# Patient Record
Sex: Male | Born: 1940 | Race: Black or African American | Hispanic: No | Smoking: Former smoker
Health system: Southern US, Community
[De-identification: ages and names within clinical notes are randomized; demographics above are authoritative.]

## PROBLEM LIST (undated history)

## (undated) DIAGNOSIS — E559 Vitamin D deficiency, unspecified: Secondary | ICD-10-CM

## (undated) DIAGNOSIS — I442 Atrioventricular block, complete: Secondary | ICD-10-CM

## (undated) DIAGNOSIS — I350 Nonrheumatic aortic (valve) stenosis: Secondary | ICD-10-CM

## (undated) DIAGNOSIS — M109 Gout, unspecified: Secondary | ICD-10-CM

## (undated) DIAGNOSIS — E785 Hyperlipidemia, unspecified: Secondary | ICD-10-CM

## (undated) DIAGNOSIS — R413 Other amnesia: Secondary | ICD-10-CM

## (undated) DIAGNOSIS — E119 Type 2 diabetes mellitus without complications: Secondary | ICD-10-CM

## (undated) DIAGNOSIS — D519 Vitamin B12 deficiency anemia, unspecified: Secondary | ICD-10-CM

## (undated) DIAGNOSIS — F419 Anxiety disorder, unspecified: Secondary | ICD-10-CM

## (undated) DIAGNOSIS — R269 Unspecified abnormalities of gait and mobility: Secondary | ICD-10-CM

## (undated) DIAGNOSIS — N182 Chronic kidney disease, stage 2 (mild): Secondary | ICD-10-CM

## (undated) DIAGNOSIS — I10 Essential (primary) hypertension: Secondary | ICD-10-CM

## (undated) DIAGNOSIS — Z950 Presence of cardiac pacemaker: Secondary | ICD-10-CM

## (undated) DIAGNOSIS — I739 Peripheral vascular disease, unspecified: Secondary | ICD-10-CM

## (undated) DIAGNOSIS — I441 Atrioventricular block, second degree: Secondary | ICD-10-CM

## (undated) DIAGNOSIS — I1310 Hypertensive heart and chronic kidney disease without heart failure, with stage 1 through stage 4 chronic kidney disease, or unspecified chronic kidney disease: Secondary | ICD-10-CM

## (undated) HISTORY — DX: Essential (primary) hypertension: I10

## (undated) HISTORY — DX: Chronic kidney disease, stage 2 (mild): N18.2

## (undated) HISTORY — DX: Unspecified abnormalities of gait and mobility: R26.9

## (undated) HISTORY — PX: CARDIOVASCULAR STRESS TEST: SHX262

## (undated) HISTORY — DX: Other amnesia: R41.3

## (undated) HISTORY — DX: Vitamin B12 deficiency anemia, unspecified: D51.9

## (undated) HISTORY — DX: Vitamin D deficiency, unspecified: E55.9

## (undated) HISTORY — DX: Hypertensive heart and chronic kidney disease without heart failure, with stage 1 through stage 4 chronic kidney disease, or unspecified chronic kidney disease: I13.10

## (undated) HISTORY — DX: Hyperlipidemia, unspecified: E78.5

## (undated) HISTORY — DX: Peripheral vascular disease, unspecified: I73.9

## (undated) HISTORY — DX: Type 2 diabetes mellitus without complications: E11.9

## (undated) HISTORY — DX: Gout, unspecified: M10.9

## (undated) HISTORY — DX: Atrioventricular block, second degree: I44.1

## (undated) HISTORY — PX: TRANSTHORACIC ECHOCARDIOGRAM: SHX275

## (undated) HISTORY — PX: OTHER SURGICAL HISTORY: SHX169

## (undated) HISTORY — DX: Anxiety disorder, unspecified: F41.9

## (undated) HISTORY — PX: EYE SURGERY: SHX253

---

## 2014-03-17 ENCOUNTER — Ambulatory Visit: Payer: Self-pay | Admitting: Podiatry

## 2014-04-09 ENCOUNTER — Encounter: Payer: Self-pay | Admitting: Podiatry

## 2014-04-09 ENCOUNTER — Other Ambulatory Visit (HOSPITAL_COMMUNITY): Payer: Self-pay | Admitting: *Deleted

## 2014-04-09 ENCOUNTER — Ambulatory Visit (INDEPENDENT_AMBULATORY_CARE_PROVIDER_SITE_OTHER): Payer: Medicare Other | Admitting: Podiatry

## 2014-04-09 ENCOUNTER — Telehealth (HOSPITAL_COMMUNITY): Payer: Self-pay | Admitting: *Deleted

## 2014-04-09 VITALS — BP 188/80 | HR 63 | Resp 16 | Ht 71.0 in | Wt 214.0 lb

## 2014-04-09 DIAGNOSIS — M2010 Hallux valgus (acquired), unspecified foot: Secondary | ICD-10-CM

## 2014-04-09 DIAGNOSIS — L600 Ingrowing nail: Secondary | ICD-10-CM

## 2014-04-09 NOTE — Progress Notes (Signed)
   Subjective:    Patient ID: Dylan Ina., male    DOB: 1941-08-25, 73 y.o.   MRN: [id number]  HPI Comments: N debridement L 1 - 10 toenails D and O long-term C elongated toenails A diabetic pt, difficulty cutting T history of podiatric care in [place]  Diabetic x25 years without any recent podiatric evaluation and/or treatment the   Review of Systems  Constitutional: Positive for activity change and appetite change.  Genitourinary: Positive for frequency.  All other systems reviewed and are negative.      Objective:   Physical Exam  Orientated x3 space black male  Vascular: DP right 0/4 and DP left 2/4 PT right 0/4 and PT left 2/4  Neurological: Sensation to 10 g monofilament wire intact 5/5 bilaterally Vibratory sensation intact bilaterally Ankle reflexes equal and reactive bilaterally  Dermatological: Incurvated toenails without any dystrophic changes noted  Musculoskeletal: HAV deformity left No restriction ankle, midtarsal, subtalar joints bilaterally      Assessment & Plan:   Assessment: Rule out peripheral arterial disease because of diminished pedal pulses right Incurvated toenails x10 HAV deformity left  Plan: Refer patient to vascular lab for lower extremity arterial Doppler for the indication of diminished pedal pulses right and diabetes  Debrided nondystrophic  toenails x10  Notify patient of results of vascular examination

## 2014-04-09 NOTE — Patient Instructions (Signed)
The vascular lab will contact you to schedule a lower extremity arterial Doppler  Diabetes and Foot Care Diabetes may cause you to have problems because of poor blood supply (circulation) to your feet and legs. This may cause the skin on your feet to become thinner, break easier, and heal more slowly. Your skin may become dry, and the skin may peel and crack. You may also have nerve damage in your legs and feet causing decreased feeling in them. You may not notice minor injuries to your feet that could lead to infections or more serious problems. Taking care of your feet is one of the most important things you can do for yourself.  HOME CARE INSTRUCTIONS  Wear shoes at all times, even in the house. Do not go barefoot. Bare feet are easily injured.  Check your feet daily for blisters, cuts, and redness. If you cannot see the bottom of your feet, use a mirror or ask someone for help.  Wash your feet with warm water (do not use hot water) and mild soap. Then pat your feet and the areas between your toes until they are completely dry. Do not soak your feet as this can dry your skin.  Apply a moisturizing lotion or petroleum jelly (that does not contain alcohol and is unscented) to the skin on your feet and to dry, brittle toenails. Do not apply lotion between your toes.  Trim your toenails straight across. Do not dig under them or around the cuticle. File the edges of your nails with an emery board or nail file.  Do not cut corns or calluses or try to remove them with medicine.  Wear clean socks or stockings every day. Make sure they are not too tight. Do not wear knee-high stockings since they may decrease blood flow to your legs.  Wear shoes that fit properly and have enough cushioning. To break in new shoes, wear them for just a few hours a day. This prevents you from injuring your feet. Always look in your shoes before you put them on to be sure there are no objects inside.  Do not cross your  legs. This may decrease the blood flow to your feet.  If you find a minor scrape, cut, or break in the skin on your feet, keep it and the skin around it clean and dry. These areas may be cleansed with mild soap and water. Do not cleanse the area with peroxide, alcohol, or iodine.  When you remove an adhesive bandage, be sure not to damage the skin around it.  If you have a wound, look at it several times a day to make sure it is healing.  Do not use heating pads or hot water bottles. They may burn your skin. If you have lost feeling in your feet or legs, you may not know it is happening until it is too late.  Make sure your health care provider performs a complete foot exam at least annually or more often if you have foot problems. Report any cuts, sores, or bruises to your health care provider immediately. SEEK MEDICAL CARE IF:   You have an injury that is not healing.  You have cuts or breaks in the skin.  You have an ingrown nail.  You notice redness on your legs or feet.  You feel burning or tingling in your legs or feet.  You have pain or cramps in your legs and feet.  Your legs or feet are numb.  Your feet  always feel cold. SEEK IMMEDIATE MEDICAL CARE IF:   There is increasing redness, swelling, or pain in or around a wound.  There is a red line that goes up your leg.  Pus is coming from a wound.  You develop a fever or as directed by your health care provider.  You notice a bad smell coming from an ulcer or wound. Document Released: 10/14/2000 Document Revised: 06/19/2013 Document Reviewed: 03/26/2013 [facility] Patient Information 2014 [employer].

## 2014-04-10 ENCOUNTER — Encounter: Payer: Self-pay | Admitting: Podiatry

## 2014-04-30 ENCOUNTER — Ambulatory Visit (HOSPITAL_COMMUNITY)
Admission: RE | Admit: 2014-04-30 | Discharge: 2014-04-30 | Disposition: A | Discharge status: Home / Self Care | Payer: Medicare Other | Source: Ambulatory Visit | Attending: Cardiovascular Disease | Admitting: Cardiovascular Disease

## 2014-04-30 DIAGNOSIS — R0989 Other specified symptoms and signs involving the circulatory and respiratory systems: Secondary | ICD-10-CM

## 2014-04-30 DIAGNOSIS — L600 Ingrowing nail: Secondary | ICD-10-CM | POA: Diagnosis present

## 2014-04-30 NOTE — Progress Notes (Signed)
Lower Extremity Arterial Duplex Completed. °Brianna L Mazza,RVT °

## 2014-05-07 ENCOUNTER — Telehealth: Payer: Self-pay | Admitting: *Deleted

## 2014-05-07 ENCOUNTER — Telehealth (HOSPITAL_COMMUNITY): Payer: Self-pay | Admitting: *Deleted

## 2014-05-07 DIAGNOSIS — I739 Peripheral vascular disease, unspecified: Secondary | ICD-10-CM

## 2014-05-07 NOTE — Telephone Encounter (Signed)
I called and informed the patient's wife that he needs to follow-up with Dr. Gwenlyn Found because his dopplers were abnormal per Dr. Amalia Hailey.  I told her Dr. Kennon Holter office will contact him to schedule the appointment.  She stated she would let him know.

## 2014-05-07 NOTE — Telephone Encounter (Signed)
Message copied by Lolita Rieger on Wed May 07, 2014  8:43 AM ------      Message from: Gean Birchwood      Created: Wed May 07, 2014  8:10 AM       Lower extremity arterial Doppler dated 05/06/2014            The results are abnormal and need immediate followup      Contact patient and Dr. Gwenlyn Found for immediate referral for abnormal arterial Doppler ------

## 2014-05-09 ENCOUNTER — Telehealth: Payer: Self-pay | Admitting: Cardiovascular Disease

## 2014-05-09 NOTE — Telephone Encounter (Signed)
Closed encounter °

## 2014-05-13 ENCOUNTER — Encounter: Payer: Self-pay | Admitting: Cardiovascular Disease

## 2014-05-13 ENCOUNTER — Ambulatory Visit (INDEPENDENT_AMBULATORY_CARE_PROVIDER_SITE_OTHER): Payer: Medicare Other | Admitting: Cardiovascular Disease

## 2014-05-13 VITALS — BP 156/72 | HR 52

## 2014-05-13 DIAGNOSIS — I10 Essential (primary) hypertension: Secondary | ICD-10-CM | POA: Insufficient documentation

## 2014-05-13 DIAGNOSIS — E1122 Type 2 diabetes mellitus with diabetic chronic kidney disease: Secondary | ICD-10-CM | POA: Insufficient documentation

## 2014-05-13 DIAGNOSIS — I441 Atrioventricular block, second degree: Secondary | ICD-10-CM

## 2014-05-13 DIAGNOSIS — E119 Type 2 diabetes mellitus without complications: Secondary | ICD-10-CM | POA: Insufficient documentation

## 2014-05-13 DIAGNOSIS — N1831 Chronic kidney disease, stage 3a: Secondary | ICD-10-CM | POA: Insufficient documentation

## 2014-05-13 DIAGNOSIS — I739 Peripheral vascular disease, unspecified: Secondary | ICD-10-CM

## 2014-05-13 NOTE — Assessment & Plan Note (Signed)
Controlled on current medications 

## 2014-05-13 NOTE — Assessment & Plan Note (Signed)
Patient was sent to me by Dr. Amalia Hailey for evaluation of PAD because of poorly palpated peripheral pulses. He apparently recently had gout. He denies claudication. Lower extremity arterial Doppler studies performed in our office 04/30/14 revealed two-vessel disease right worse than left. There is no history of critical limb ischemia. Recommend conservative therapy at this point.

## 2014-05-13 NOTE — Patient Instructions (Signed)
Your physician wants you to follow-up in: 6 months with Dr Berry. You will receive a reminder letter in the mail two months in advance. If you don't receive a letter, please call our office to schedule the follow-up appointment.  

## 2014-05-13 NOTE — Assessment & Plan Note (Signed)
Patient is asymptomatic 21 AV block with a ventricular response of 40.

## 2014-05-13 NOTE — Progress Notes (Signed)
     05/13/2014 Dylan [name].   Jul 06, 1941  [id number]  Primary Physician Randalyn Rhea, MD Primary Cardiologist: Lorretta Harp MD Renae Gloss   HPI:  Dylan [name] is a 73 year old moderately overweight married African American male father of 4 children accompanied by his wife today. He was referred by Dr. Amalia Hailey from [place] , for evaluation of peripheral arterial disease. He is retired from working in a [employer]. His cardiac risk factors include treated hypertension and diabetes. He smoked remotely. He denies chest pain, shortness of breath or claudication. Recent lower extremity arterial Doppler studies revealed triple-vessel disease. 12-lead EKG today revealed second-degree AV block with a ventricular response of 40 despite being completely asymptomatic. He is on no rate slowing drugs.   Current Outpatient Prescriptions  Medication Sig Dispense Refill  . lisinopril (PRINIVIL,ZESTRIL) 10 MG tablet Take 10 mg by mouth daily.      . metFORMIN (GLUCOPHAGE) 1000 MG tablet Take 1,000 mg by mouth 2 (two) times daily with a meal.      . pioglitazone (ACTOS) 45 MG tablet Take 45 mg by mouth daily.       No current facility-administered medications for this visit.    No Known Allergies  History   Social History  . Marital Status: Married    Spouse Name: N/A    Number of Children: N/A  . Years of Education: N/A   Occupational History  . Not on file.   Social History Main Topics  . Smoking status: Former Research scientist (life sciences)  . Smokeless tobacco: Not on file  . Alcohol Use: Not on file  . Drug Use: Not on file  . Sexual Activity: Not on file   Other Topics Concern  . Not on file   Social History Narrative  . No narrative on file     Review of Systems: General: negative for chills, fever, night sweats or weight changes.  Cardiovascular: negative for chest pain, dyspnea on exertion, edema, orthopnea, palpitations, paroxysmal nocturnal dyspnea or shortness of  breath Dermatological: negative for rash Respiratory: negative for cough or wheezing Urologic: negative for hematuria Abdominal: negative for nausea, vomiting, diarrhea, bright red blood per rectum, melena, or hematemesis Neurologic: negative for visual changes, syncope, or dizziness All other systems reviewed and are otherwise negative except as noted above.    Blood pressure 156/72, pulse 52.  General appearance: alert and no distress Neck: no adenopathy, no carotid bruit, no JVD, supple, symmetrical, trachea midline and thyroid not enlarged, symmetric, no tenderness/mass/nodules Lungs: clear to auscultation bilaterally Heart: bradycardic pulse Extremities: diminished pedal pulses  EKG second-degree AV block with a ventricular response of 40  ASSESSMENT AND PLAN:   Second degree AV block Patient is asymptomatic 21 AV block with a ventricular response of 40.  Essential hypertension Controlled on current medications  Peripheral arterial disease Patient was sent to me by Dr. Amalia Hailey for evaluation of PAD because of poorly palpated peripheral pulses. He apparently recently had gout. He denies claudication. Lower extremity arterial Doppler studies performed in our office 04/30/14 revealed two-vessel disease right worse than left. There is no history of critical limb ischemia. Recommend conservative therapy at this point.      Lorretta Harp MD FACP,FACC,FAHA, [facility] 05/13/2014 12:32 PM

## 2014-06-18 ENCOUNTER — Ambulatory Visit (INDEPENDENT_AMBULATORY_CARE_PROVIDER_SITE_OTHER): Payer: Medicare Other | Admitting: Podiatry

## 2014-06-18 ENCOUNTER — Ambulatory Visit: Payer: Medicare Other | Admitting: Podiatry

## 2014-06-18 ENCOUNTER — Encounter: Payer: Self-pay | Admitting: Podiatry

## 2014-06-18 VITALS — BP 136/76 | HR 82 | Resp 12

## 2014-06-18 DIAGNOSIS — L600 Ingrowing nail: Secondary | ICD-10-CM

## 2014-06-18 DIAGNOSIS — E1159 Type 2 diabetes mellitus with other circulatory complications: Secondary | ICD-10-CM

## 2014-06-18 NOTE — Progress Notes (Signed)
Patient ID: Dylan [name]., male   DOB: 1941/04/23, 73 y.o.   MRN: [id number]  Subjective: This patient presents today requesting nail debridement.  Objective: Vascular examination by Dr. Quay Burow confirms peripheral arterial disease under surveillance  The toenails are incurvated, elongated and normal trophic texture 6-10  Assessment: Diabetic with associated peripheral arterial disease Non-dystrophic toenails 6-10  Plan: Nails x10 are debrided without a bleeding  Reappoint x3 months

## 2014-08-26 LAB — PSA: PSA: 2.8

## 2014-09-24 ENCOUNTER — Ambulatory Visit (INDEPENDENT_AMBULATORY_CARE_PROVIDER_SITE_OTHER): Payer: Medicare Other | Admitting: Podiatry

## 2014-09-24 ENCOUNTER — Encounter: Payer: Self-pay | Admitting: Podiatry

## 2014-09-24 DIAGNOSIS — I739 Peripheral vascular disease, unspecified: Secondary | ICD-10-CM

## 2014-09-24 DIAGNOSIS — L608 Other nail disorders: Secondary | ICD-10-CM

## 2014-09-24 DIAGNOSIS — L600 Ingrowing nail: Secondary | ICD-10-CM

## 2014-09-24 NOTE — Patient Instructions (Signed)
Diabetes and Foot Care Diabetes may cause you to have problems because of poor blood supply (circulation) to your feet and legs. This may cause the skin on your feet to become thinner, break easier, and heal more slowly. Your skin may become dry, and the skin may peel and crack. You may also have nerve damage in your legs and feet causing decreased feeling in them. You may not notice minor injuries to your feet that could lead to infections or more serious problems. Taking care of your feet is one of the most important things you can do for yourself.  HOME CARE INSTRUCTIONS  Wear shoes at all times, even in the house. Do not go barefoot. Bare feet are easily injured.  Check your feet daily for blisters, cuts, and redness. If you cannot see the bottom of your feet, use a mirror or ask someone for help.  Wash your feet with warm water (do not use hot water) and mild soap. Then pat your feet and the areas between your toes until they are completely dry. Do not soak your feet as this can dry your skin.  Apply a moisturizing lotion or petroleum jelly (that does not contain alcohol and is unscented) to the skin on your feet and to dry, brittle toenails. Do not apply lotion between your toes.  Trim your toenails straight across. Do not dig under them or around the cuticle. File the edges of your nails with an emery board or nail file.  Do not cut corns or calluses or try to remove them with medicine.  Wear clean socks or stockings every day. Make sure they are not too tight. Do not wear knee-high stockings since they may decrease blood flow to your legs.  Wear shoes that fit properly and have enough cushioning. To break in new shoes, wear them for just a few hours a day. This prevents you from injuring your feet. Always look in your shoes before you put them on to be sure there are no objects inside.  Do not cross your legs. This may decrease the blood flow to your feet.  If you find a minor scrape,  cut, or break in the skin on your feet, keep it and the skin around it clean and dry. These areas may be cleansed with mild soap and water. Do not cleanse the area with peroxide, alcohol, or iodine.  When you remove an adhesive bandage, be sure not to damage the skin around it.  If you have a wound, look at it several times a day to make sure it is healing.  Do not use heating pads or hot water bottles. They may burn your skin. If you have lost feeling in your feet or legs, you may not know it is happening until it is too late.  Make sure your health care provider performs a complete foot exam at least annually or more often if you have foot problems. Report any cuts, sores, or bruises to your health care provider immediately. SEEK MEDICAL CARE IF:   You have an injury that is not healing.  You have cuts or breaks in the skin.  You have an ingrown nail.  You notice redness on your legs or feet.  You feel burning or tingling in your legs or feet.  You have pain or cramps in your legs and feet.  Your legs or feet are numb.  Your feet always feel cold. SEEK IMMEDIATE MEDICAL CARE IF:   There is increasing redness,   swelling, or pain in or around a wound.  There is a red line that goes up your leg.  Pus is coming from a wound.  You develop a fever or as directed by your health care provider.  You notice a bad smell coming from an ulcer or wound. Document Released: 10/14/2000 Document Revised: 06/19/2013 Document Reviewed: 03/26/2013 [employer] Patient Information 2015 [employer]. This information is not intended to replace advice given to you by your health care provider. Make sure you discuss any questions you have with your health care provider.  

## 2014-09-24 NOTE — Progress Notes (Signed)
Patient ID: Dylan Ina., male   DOB: 10-25-1941, 72 y.o.   MRN: [id number]   Subjective: Patient presents requesting nail debridement  Objective: Peripheral arterial disease under the management of Dr. Quay Burow Incurvated toenails with normal texture 6-10  Assessment: Non-dystrophic toenails 6-10 Diabetic with peripheral arterial disease  Plan: Debrided toenails 10 without a bleeding  Reappoint at three-month intervals

## 2014-11-25 LAB — POCT ERYTHROCYTE SEDIMENTATION RATE, NON-AUTOMATED: Sed Rate: 37

## 2014-12-12 ENCOUNTER — Encounter: Payer: Self-pay | Admitting: Cardiovascular Disease

## 2014-12-12 ENCOUNTER — Ambulatory Visit (INDEPENDENT_AMBULATORY_CARE_PROVIDER_SITE_OTHER): Payer: Medicare HMO | Admitting: Cardiovascular Disease

## 2014-12-12 VITALS — BP 156/64 | HR 54 | Ht 71.0 in | Wt 207.0 lb

## 2014-12-12 DIAGNOSIS — I10 Essential (primary) hypertension: Secondary | ICD-10-CM | POA: Diagnosis not present

## 2014-12-12 DIAGNOSIS — I739 Peripheral vascular disease, unspecified: Secondary | ICD-10-CM

## 2014-12-12 DIAGNOSIS — I441 Atrioventricular block, second degree: Secondary | ICD-10-CM

## 2014-12-12 NOTE — Assessment & Plan Note (Signed)
History of peripheral arterial disease by duplex ultrasound which was checked 04/30/14 revealing predominantly tibial vessel disease worse on the right. He has no claudication nor does he have evidence of critical limb ischemia.

## 2014-12-12 NOTE — Patient Instructions (Signed)
Your physician wants you to follow-up in: 1 year with Dr Berry. You will receive a reminder letter in the mail two months in advance. If you don't receive a letter, please call our office to schedule the follow-up appointment.  

## 2014-12-12 NOTE — Progress Notes (Signed)
     12/12/2014 Dylan [name].   05/02/1941  [id number]  Primary Physician Randalyn Rhea, MD Primary Cardiologist: Lorretta Harp MD Renae Gloss  . HPI:  Mr. Dylan [name] is a 74 year old moderately overweight married African American male father of 4 children accompanied by his wife today. He was referred by Dr. Amalia Hailey from [place] , for evaluation of peripheral arterial disease I last saw him in the office 05/13/14.[name] He is retired from working in a Optometrist. His cardiac risk factors include treated hypertension and diabetes. He smoked remotely. He denies chest pain, shortness of breath or claudication. Recent lower extremity arterial Doppler studies revealed triple-vessel disease. 12-lead EKG today revealed second-degree AV block with a ventricular response of 40 despite being completely asymptomatic. He is on no rate slowing drugs. Since I saw him last he is completely asymptomatic.   Current Outpatient Prescriptions  Medication Sig Dispense Refill  . lisinopril (PRINIVIL,ZESTRIL) 10 MG tablet Take 10 mg by mouth daily.    . metFORMIN (GLUCOPHAGE) 1000 MG tablet Take 1,000 mg by mouth 2 (two) times daily with a meal.    . pioglitazone (ACTOS) 45 MG tablet Take 45 mg by mouth daily.     No current facility-administered medications for this visit.    No Known Allergies  History   Social History  . Marital Status: Married    Spouse Name: N/A  . Number of Children: N/A  . Years of Education: N/A   Occupational History  . Not on file.   Social History Main Topics  . Smoking status: Former Research scientist (life sciences)  . Smokeless tobacco: Not on file  . Alcohol Use: Not on file  . Drug Use: Not on file  . Sexual Activity: Not on file   Other Topics Concern  . Not on file   Social History Narrative     Review of Systems: General: negative for chills, fever, night sweats or weight changes.  Cardiovascular: negative for chest pain, dyspnea on exertion, edema, orthopnea,  palpitations, paroxysmal nocturnal dyspnea or shortness of breath Dermatological: negative for rash Respiratory: negative for cough or wheezing Urologic: negative for hematuria Abdominal: negative for nausea, vomiting, diarrhea, bright red blood per rectum, melena, or hematemesis Neurologic: negative for visual changes, syncope, or dizziness All other systems reviewed and are otherwise negative except as noted above.    Blood pressure 156/64, pulse 54, height 5\' 11"  (1.803 m), weight 207 lb (93.895 kg).  General appearance: alert and no distress Neck: no adenopathy, no carotid bruit, no JVD, supple, symmetrical, trachea midline and thyroid not enlarged, symmetric, no tenderness/mass/nodules Lungs: clear to auscultation bilaterally Heart: bradycardic today Extremities: extremities normal, atraumatic, no cyanosis or edema  EKG normal sinus rhythm with second-degree AV block and a ventricular response of 44 and I personally reviewed this EKG  ASSESSMENT AND PLAN:   Second degree AV block The patient has a history of second-degree AV block. He is on no rate slowing drugs. His heart rate today is 44 similar to prior visit. He is asymptomatic.   Peripheral arterial disease History of peripheral arterial disease by duplex ultrasound which was checked 04/30/14 revealing predominantly tibial vessel disease worse on the right. He has no claudication nor does he have evidence of critical limb ischemia.   Essential hypertension History of hypertension with blood pressure measured 156/64. He was recently begun on lisinopril followed by his PCP       Lorretta Harp MD [facility], [facility] 12/12/2014 12:59 PM

## 2014-12-12 NOTE — Assessment & Plan Note (Signed)
The patient has a history of second-degree AV block. He is on no rate slowing drugs. His heart rate today is 44 similar to prior visit. He is asymptomatic.

## 2014-12-12 NOTE — Assessment & Plan Note (Signed)
History of hypertension with blood pressure measured 156/64. He was recently begun on lisinopril followed by his PCP

## 2014-12-24 ENCOUNTER — Ambulatory Visit (INDEPENDENT_AMBULATORY_CARE_PROVIDER_SITE_OTHER): Payer: Medicare HMO | Admitting: Podiatry

## 2014-12-24 ENCOUNTER — Ambulatory Visit: Payer: Medicare Other | Admitting: Podiatry

## 2014-12-24 ENCOUNTER — Encounter: Payer: Self-pay | Admitting: Podiatry

## 2014-12-24 DIAGNOSIS — L608 Other nail disorders: Secondary | ICD-10-CM | POA: Diagnosis not present

## 2014-12-24 DIAGNOSIS — E1159 Type 2 diabetes mellitus with other circulatory complications: Secondary | ICD-10-CM

## 2014-12-24 DIAGNOSIS — I739 Peripheral vascular disease, unspecified: Secondary | ICD-10-CM

## 2014-12-24 NOTE — Progress Notes (Signed)
Patient ID: Dylan Ina., male   DOB: [birth year], 74 y.o.   MRN: [id number]  Subjective: This patient presents today requesting toenail debridement  Objective: Incurvated toenails with normal texture 6-10  Assessment: Non-dystrophic toenails 6-10 Peripheral arterial disease  Plan: Debridement of toenails 10 without a bleeding  Reappoint 3 months

## 2014-12-24 NOTE — Patient Instructions (Signed)
Diabetes and Foot Care Diabetes may cause you to have problems because of poor blood supply (circulation) to your feet and legs. This may cause the skin on your feet to become thinner, break easier, and heal more slowly. Your skin may become dry, and the skin may peel and crack. You may also have nerve damage in your legs and feet causing decreased feeling in them. You may not notice minor injuries to your feet that could lead to infections or more serious problems. Taking care of your feet is one of the most important things you can do for yourself.  HOME CARE INSTRUCTIONS  Wear shoes at all times, even in the house. Do not go barefoot. Bare feet are easily injured.  Check your feet daily for blisters, cuts, and redness. If you cannot see the bottom of your feet, use a mirror or ask someone for help.  Wash your feet with warm water (do not use hot water) and mild soap. Then pat your feet and the areas between your toes until they are completely dry. Do not soak your feet as this can dry your skin.  Apply a moisturizing lotion or petroleum jelly (that does not contain alcohol and is unscented) to the skin on your feet and to dry, brittle toenails. Do not apply lotion between your toes.  Trim your toenails straight across. Do not dig under them or around the cuticle. File the edges of your nails with an emery board or nail file.  Do not cut corns or calluses or try to remove them with medicine.  Wear clean socks or stockings every day. Make sure they are not too tight. Do not wear knee-high stockings since they may decrease blood flow to your legs.  Wear shoes that fit properly and have enough cushioning. To break in new shoes, wear them for just a few hours a day. This prevents you from injuring your feet. Always look in your shoes before you put them on to be sure there are no objects inside.  Do not cross your legs. This may decrease the blood flow to your feet.  If you find a minor scrape,  cut, or break in the skin on your feet, keep it and the skin around it clean and dry. These areas may be cleansed with mild soap and water. Do not cleanse the area with peroxide, alcohol, or iodine.  When you remove an adhesive bandage, be sure not to damage the skin around it.  If you have a wound, look at it several times a day to make sure it is healing.  Do not use heating pads or hot water bottles. They may burn your skin. If you have lost feeling in your feet or legs, you may not know it is happening until it is too late.  Make sure your health care provider performs a complete foot exam at least annually or more often if you have foot problems. Report any cuts, sores, or bruises to your health care provider immediately. SEEK MEDICAL CARE IF:   You have an injury that is not healing.  You have cuts or breaks in the skin.  You have an ingrown nail.  You notice redness on your legs or feet.  You feel burning or tingling in your legs or feet.  You have pain or cramps in your legs and feet.  Your legs or feet are numb.  Your feet always feel cold. SEEK IMMEDIATE MEDICAL CARE IF:   There is increasing redness,   swelling, or pain in or around a wound.  There is a red line that goes up your leg.  Pus is coming from a wound.  You develop a fever or as directed by your health care provider.  You notice a bad smell coming from an ulcer or wound. Document Released: 10/14/2000 Document Revised: 06/19/2013 Document Reviewed: 03/26/2013 [employer] Patient Information 2015 [employer]. This information is not intended to replace advice given to you by your health care provider. Make sure you discuss any questions you have with your health care provider.  

## 2015-01-07 ENCOUNTER — Ambulatory Visit: Payer: Medicare Other | Admitting: Podiatry

## 2015-03-25 ENCOUNTER — Encounter: Payer: Self-pay | Admitting: Podiatry

## 2015-03-25 ENCOUNTER — Ambulatory Visit (INDEPENDENT_AMBULATORY_CARE_PROVIDER_SITE_OTHER): Payer: Medicare HMO | Admitting: Podiatry

## 2015-03-25 DIAGNOSIS — L608 Other nail disorders: Secondary | ICD-10-CM

## 2015-03-25 DIAGNOSIS — L609 Nail disorder, unspecified: Secondary | ICD-10-CM | POA: Diagnosis not present

## 2015-03-25 DIAGNOSIS — I739 Peripheral vascular disease, unspecified: Secondary | ICD-10-CM

## 2015-03-25 DIAGNOSIS — E1159 Type 2 diabetes mellitus with other circulatory complications: Secondary | ICD-10-CM

## 2015-03-25 NOTE — Progress Notes (Signed)
Patient ID: Dylan Ina., male   DOB: 1940/11/09, 74 y.o.   MRN: [id number]  Subjective: This patient presents today requesting nail debridement  Objective: The toenails are incurvated and normal trophic in texture  Assessment: Non-dystrophic toenails 6-10 History of peripheral arterial disease  Plan: Debrided toenails 10 without any bleeding  Reappoint 3 months

## 2015-06-23 ENCOUNTER — Ambulatory Visit (INDEPENDENT_AMBULATORY_CARE_PROVIDER_SITE_OTHER): Payer: Medicare HMO | Admitting: Podiatry

## 2015-06-23 ENCOUNTER — Encounter: Payer: Self-pay | Admitting: Podiatry

## 2015-06-23 DIAGNOSIS — L608 Other nail disorders: Secondary | ICD-10-CM

## 2015-06-23 DIAGNOSIS — E1151 Type 2 diabetes mellitus with diabetic peripheral angiopathy without gangrene: Secondary | ICD-10-CM

## 2015-06-23 DIAGNOSIS — L609 Nail disorder, unspecified: Secondary | ICD-10-CM

## 2015-06-23 NOTE — Patient Instructions (Signed)
Diabetes and Foot Care Diabetes may cause you to have problems because of poor blood supply (circulation) to your feet and legs. This may cause the skin on your feet to become thinner, break easier, and heal more slowly. Your skin may become dry, and the skin may peel and crack. You may also have nerve damage in your legs and feet causing decreased feeling in them. You may not notice minor injuries to your feet that could lead to infections or more serious problems. Taking care of your feet is one of the most important things you can do for yourself.  HOME CARE INSTRUCTIONS  Wear shoes at all times, even in the house. Do not go barefoot. Bare feet are easily injured.  Check your feet daily for blisters, cuts, and redness. If you cannot see the bottom of your feet, use a mirror or ask someone for help.  Wash your feet with warm water (do not use hot water) and mild soap. Then pat your feet and the areas between your toes until they are completely dry. Do not soak your feet as this can dry your skin.  Apply a moisturizing lotion or petroleum jelly (that does not contain alcohol and is unscented) to the skin on your feet and to dry, brittle toenails. Do not apply lotion between your toes.  Trim your toenails straight across. Do not dig under them or around the cuticle. File the edges of your nails with an emery board or nail file.  Do not cut corns or calluses or try to remove them with medicine.  Wear clean socks or stockings every day. Make sure they are not too tight. Do not wear knee-high stockings since they may decrease blood flow to your legs.  Wear shoes that fit properly and have enough cushioning. To break in new shoes, wear them for just a few hours a day. This prevents you from injuring your feet. Always look in your shoes before you put them on to be sure there are no objects inside.  Do not cross your legs. This may decrease the blood flow to your feet.  If you find a minor scrape,  cut, or break in the skin on your feet, keep it and the skin around it clean and dry. These areas may be cleansed with mild soap and water. Do not cleanse the area with peroxide, alcohol, or iodine.  When you remove an adhesive bandage, be sure not to damage the skin around it.  If you have a wound, look at it several times a day to make sure it is healing.  Do not use heating pads or hot water bottles. They may burn your skin. If you have lost feeling in your feet or legs, you may not know it is happening until it is too late.  Make sure your health care provider performs a complete foot exam at least annually or more often if you have foot problems. Report any cuts, sores, or bruises to your health care provider immediately. SEEK MEDICAL CARE IF:   You have an injury that is not healing.  You have cuts or breaks in the skin.  You have an ingrown nail.  You notice redness on your legs or feet.  You feel burning or tingling in your legs or feet.  You have pain or cramps in your legs and feet.  Your legs or feet are numb.  Your feet always feel cold. SEEK IMMEDIATE MEDICAL CARE IF:   There is increasing redness,   swelling, or pain in or around a wound.  There is a red line that goes up your leg.  Pus is coming from a wound.  You develop a fever or as directed by your health care provider.  You notice a bad smell coming from an ulcer or wound. Document Released: 10/14/2000 Document Revised: 06/19/2013 Document Reviewed: 03/26/2013 [employer] Patient Information 2015 [employer]. This information is not intended to replace advice given to you by your health care provider. Make sure you discuss any questions you have with your health care provider.  

## 2015-06-23 NOTE — Progress Notes (Signed)
Patient ID: Dylan Ina., male   DOB: [birth year], 74 y.o.   MRN: [id number]  Subjective: This patient presents for scheduled visit request toenail debridement  Objective: Incurvated elongated toenails 6-10 Nail plates of normal texture without color change  Assessment: Diabetic with peripheral arterial disease Incurvated non-dystrophic toenails 6-10  Plan: Debridement toenails 10 mechanically and electrically without a bleeding  Reappoint 3 months

## 2015-08-03 DIAGNOSIS — Z23 Encounter for immunization: Secondary | ICD-10-CM | POA: Diagnosis not present

## 2015-08-03 DIAGNOSIS — I129 Hypertensive chronic kidney disease with stage 1 through stage 4 chronic kidney disease, or unspecified chronic kidney disease: Secondary | ICD-10-CM | POA: Diagnosis not present

## 2015-08-03 DIAGNOSIS — Z79899 Other long term (current) drug therapy: Secondary | ICD-10-CM | POA: Diagnosis not present

## 2015-08-03 DIAGNOSIS — D519 Vitamin B12 deficiency anemia, unspecified: Secondary | ICD-10-CM | POA: Diagnosis not present

## 2015-08-03 DIAGNOSIS — N182 Chronic kidney disease, stage 2 (mild): Secondary | ICD-10-CM | POA: Diagnosis not present

## 2015-08-06 DIAGNOSIS — E113591 Type 2 diabetes mellitus with proliferative diabetic retinopathy without macular edema, right eye: Secondary | ICD-10-CM | POA: Diagnosis not present

## 2015-09-23 ENCOUNTER — Ambulatory Visit: Payer: Medicare HMO | Admitting: Podiatry

## 2015-10-06 DIAGNOSIS — N182 Chronic kidney disease, stage 2 (mild): Secondary | ICD-10-CM | POA: Diagnosis not present

## 2015-10-06 DIAGNOSIS — E1122 Type 2 diabetes mellitus with diabetic chronic kidney disease: Secondary | ICD-10-CM | POA: Diagnosis not present

## 2015-10-06 DIAGNOSIS — D519 Vitamin B12 deficiency anemia, unspecified: Secondary | ICD-10-CM | POA: Diagnosis not present

## 2015-10-06 DIAGNOSIS — I129 Hypertensive chronic kidney disease with stage 1 through stage 4 chronic kidney disease, or unspecified chronic kidney disease: Secondary | ICD-10-CM | POA: Diagnosis not present

## 2015-10-06 DIAGNOSIS — N08 Glomerular disorders in diseases classified elsewhere: Secondary | ICD-10-CM | POA: Diagnosis not present

## 2015-10-06 DIAGNOSIS — M109 Gout, unspecified: Secondary | ICD-10-CM | POA: Diagnosis not present

## 2015-11-23 DIAGNOSIS — I129 Hypertensive chronic kidney disease with stage 1 through stage 4 chronic kidney disease, or unspecified chronic kidney disease: Secondary | ICD-10-CM | POA: Diagnosis not present

## 2015-11-23 DIAGNOSIS — M109 Gout, unspecified: Secondary | ICD-10-CM | POA: Diagnosis not present

## 2015-11-23 DIAGNOSIS — N182 Chronic kidney disease, stage 2 (mild): Secondary | ICD-10-CM | POA: Diagnosis not present

## 2015-11-23 DIAGNOSIS — D519 Vitamin B12 deficiency anemia, unspecified: Secondary | ICD-10-CM | POA: Diagnosis not present

## 2015-11-24 DIAGNOSIS — E113293 Type 2 diabetes mellitus with mild nonproliferative diabetic retinopathy without macular edema, bilateral: Secondary | ICD-10-CM | POA: Diagnosis not present

## 2015-11-27 DIAGNOSIS — Z0100 Encounter for examination of eyes and vision without abnormal findings: Secondary | ICD-10-CM | POA: Diagnosis not present

## 2015-11-27 DIAGNOSIS — H25093 Other age-related incipient cataract, bilateral: Secondary | ICD-10-CM | POA: Diagnosis not present

## 2015-12-03 DIAGNOSIS — M25571 Pain in right ankle and joints of right foot: Secondary | ICD-10-CM | POA: Diagnosis not present

## 2015-12-03 DIAGNOSIS — M7751 Other enthesopathy of right foot: Secondary | ICD-10-CM | POA: Diagnosis not present

## 2015-12-03 DIAGNOSIS — M2011 Hallux valgus (acquired), right foot: Secondary | ICD-10-CM | POA: Diagnosis not present

## 2015-12-10 DIAGNOSIS — M25571 Pain in right ankle and joints of right foot: Secondary | ICD-10-CM | POA: Diagnosis not present

## 2015-12-10 DIAGNOSIS — M7751 Other enthesopathy of right foot: Secondary | ICD-10-CM | POA: Diagnosis not present

## 2015-12-24 DIAGNOSIS — E113213 Type 2 diabetes mellitus with mild nonproliferative diabetic retinopathy with macular edema, bilateral: Secondary | ICD-10-CM | POA: Diagnosis not present

## 2016-01-07 DIAGNOSIS — E1122 Type 2 diabetes mellitus with diabetic chronic kidney disease: Secondary | ICD-10-CM | POA: Diagnosis not present

## 2016-01-07 DIAGNOSIS — I129 Hypertensive chronic kidney disease with stage 1 through stage 4 chronic kidney disease, or unspecified chronic kidney disease: Secondary | ICD-10-CM | POA: Diagnosis not present

## 2016-01-07 DIAGNOSIS — N182 Chronic kidney disease, stage 2 (mild): Secondary | ICD-10-CM | POA: Diagnosis not present

## 2016-01-07 DIAGNOSIS — N08 Glomerular disorders in diseases classified elsewhere: Secondary | ICD-10-CM | POA: Diagnosis not present

## 2016-01-14 DIAGNOSIS — E119 Type 2 diabetes mellitus without complications: Secondary | ICD-10-CM | POA: Diagnosis not present

## 2016-01-14 DIAGNOSIS — I10 Essential (primary) hypertension: Secondary | ICD-10-CM | POA: Diagnosis not present

## 2016-01-14 DIAGNOSIS — E785 Hyperlipidemia, unspecified: Secondary | ICD-10-CM | POA: Diagnosis not present

## 2016-02-25 DIAGNOSIS — E113313 Type 2 diabetes mellitus with moderate nonproliferative diabetic retinopathy with macular edema, bilateral: Secondary | ICD-10-CM | POA: Diagnosis not present

## 2016-04-19 DIAGNOSIS — D519 Vitamin B12 deficiency anemia, unspecified: Secondary | ICD-10-CM | POA: Diagnosis not present

## 2016-04-19 DIAGNOSIS — R351 Nocturia: Secondary | ICD-10-CM | POA: Diagnosis not present

## 2016-04-19 DIAGNOSIS — Z1212 Encounter for screening for malignant neoplasm of rectum: Secondary | ICD-10-CM | POA: Diagnosis not present

## 2016-04-19 DIAGNOSIS — E1122 Type 2 diabetes mellitus with diabetic chronic kidney disease: Secondary | ICD-10-CM | POA: Diagnosis not present

## 2016-04-19 DIAGNOSIS — I129 Hypertensive chronic kidney disease with stage 1 through stage 4 chronic kidney disease, or unspecified chronic kidney disease: Secondary | ICD-10-CM | POA: Diagnosis not present

## 2016-04-19 DIAGNOSIS — N08 Glomerular disorders in diseases classified elsewhere: Secondary | ICD-10-CM | POA: Diagnosis not present

## 2016-04-19 DIAGNOSIS — M109 Gout, unspecified: Secondary | ICD-10-CM | POA: Diagnosis not present

## 2016-04-19 DIAGNOSIS — N182 Chronic kidney disease, stage 2 (mild): Secondary | ICD-10-CM | POA: Diagnosis not present

## 2016-04-26 DIAGNOSIS — E113313 Type 2 diabetes mellitus with moderate nonproliferative diabetic retinopathy with macular edema, bilateral: Secondary | ICD-10-CM | POA: Diagnosis not present

## 2016-06-02 DIAGNOSIS — E113313 Type 2 diabetes mellitus with moderate nonproliferative diabetic retinopathy with macular edema, bilateral: Secondary | ICD-10-CM | POA: Diagnosis not present

## 2016-07-07 DIAGNOSIS — Z1211 Encounter for screening for malignant neoplasm of colon: Secondary | ICD-10-CM | POA: Diagnosis not present

## 2016-07-19 DIAGNOSIS — E113313 Type 2 diabetes mellitus with moderate nonproliferative diabetic retinopathy with macular edema, bilateral: Secondary | ICD-10-CM | POA: Diagnosis not present

## 2016-09-20 DIAGNOSIS — E1122 Type 2 diabetes mellitus with diabetic chronic kidney disease: Secondary | ICD-10-CM | POA: Diagnosis not present

## 2016-10-26 ENCOUNTER — Encounter (HOSPITAL_COMMUNITY): Payer: Self-pay

## 2016-10-26 DIAGNOSIS — Z7984 Long term (current) use of oral hypoglycemic drugs: Secondary | ICD-10-CM | POA: Diagnosis not present

## 2016-10-26 DIAGNOSIS — Z7982 Long term (current) use of aspirin: Secondary | ICD-10-CM | POA: Diagnosis not present

## 2016-10-26 DIAGNOSIS — Y999 Unspecified external cause status: Secondary | ICD-10-CM | POA: Diagnosis not present

## 2016-10-26 DIAGNOSIS — M545 Low back pain: Secondary | ICD-10-CM | POA: Diagnosis not present

## 2016-10-26 DIAGNOSIS — Y939 Activity, unspecified: Secondary | ICD-10-CM | POA: Insufficient documentation

## 2016-10-26 DIAGNOSIS — Z87891 Personal history of nicotine dependence: Secondary | ICD-10-CM | POA: Diagnosis not present

## 2016-10-26 DIAGNOSIS — W11XXXA Fall on and from ladder, initial encounter: Secondary | ICD-10-CM | POA: Insufficient documentation

## 2016-10-26 DIAGNOSIS — M546 Pain in thoracic spine: Secondary | ICD-10-CM | POA: Diagnosis not present

## 2016-10-26 DIAGNOSIS — S32048A Other fracture of fourth lumbar vertebra, initial encounter for closed fracture: Secondary | ICD-10-CM | POA: Insufficient documentation

## 2016-10-26 DIAGNOSIS — E1151 Type 2 diabetes mellitus with diabetic peripheral angiopathy without gangrene: Secondary | ICD-10-CM | POA: Insufficient documentation

## 2016-10-26 DIAGNOSIS — S3992XA Unspecified injury of lower back, initial encounter: Secondary | ICD-10-CM | POA: Diagnosis present

## 2016-10-26 DIAGNOSIS — S299XXA Unspecified injury of thorax, initial encounter: Secondary | ICD-10-CM | POA: Diagnosis not present

## 2016-10-26 DIAGNOSIS — I10 Essential (primary) hypertension: Secondary | ICD-10-CM | POA: Diagnosis not present

## 2016-10-26 DIAGNOSIS — Y929 Unspecified place or not applicable: Secondary | ICD-10-CM | POA: Diagnosis not present

## 2016-10-26 DIAGNOSIS — S32040A Wedge compression fracture of fourth lumbar vertebra, initial encounter for closed fracture: Secondary | ICD-10-CM | POA: Diagnosis not present

## 2016-10-26 NOTE — ED Triage Notes (Signed)
Pt states that he fell off a ladder today, ladder is 4 steps and he was on the second. Pt c/o back pain, denies hitting head or LOC. Also c/o leg tingling, denies losing control of bowel or bladder.

## 2016-10-27 ENCOUNTER — Emergency Department (HOSPITAL_COMMUNITY): Payer: Medicare HMO

## 2016-10-27 ENCOUNTER — Emergency Department (HOSPITAL_COMMUNITY)
Admission: EM | Admit: 2016-10-27 | Discharge: 2016-10-27 | Disposition: A | Discharge status: Home / Self Care | Payer: Medicare HMO | Attending: Emergency Medicine | Admitting: Emergency Medicine

## 2016-10-27 DIAGNOSIS — S32048A Other fracture of fourth lumbar vertebra, initial encounter for closed fracture: Secondary | ICD-10-CM | POA: Diagnosis not present

## 2016-10-27 DIAGNOSIS — M546 Pain in thoracic spine: Secondary | ICD-10-CM | POA: Diagnosis not present

## 2016-10-27 DIAGNOSIS — S299XXA Unspecified injury of thorax, initial encounter: Secondary | ICD-10-CM | POA: Diagnosis not present

## 2016-10-27 DIAGNOSIS — W11XXXA Fall on and from ladder, initial encounter: Secondary | ICD-10-CM | POA: Diagnosis not present

## 2016-10-27 DIAGNOSIS — S32040A Wedge compression fracture of fourth lumbar vertebra, initial encounter for closed fracture: Secondary | ICD-10-CM

## 2016-10-27 DIAGNOSIS — Z7982 Long term (current) use of aspirin: Secondary | ICD-10-CM | POA: Diagnosis not present

## 2016-10-27 DIAGNOSIS — Z87891 Personal history of nicotine dependence: Secondary | ICD-10-CM | POA: Diagnosis not present

## 2016-10-27 DIAGNOSIS — Z7984 Long term (current) use of oral hypoglycemic drugs: Secondary | ICD-10-CM | POA: Diagnosis not present

## 2016-10-27 DIAGNOSIS — I10 Essential (primary) hypertension: Secondary | ICD-10-CM | POA: Diagnosis not present

## 2016-10-27 DIAGNOSIS — E1151 Type 2 diabetes mellitus with diabetic peripheral angiopathy without gangrene: Secondary | ICD-10-CM | POA: Diagnosis not present

## 2016-10-27 DIAGNOSIS — S3992XA Unspecified injury of lower back, initial encounter: Secondary | ICD-10-CM | POA: Diagnosis not present

## 2016-10-27 DIAGNOSIS — M545 Low back pain: Secondary | ICD-10-CM | POA: Diagnosis not present

## 2016-10-27 MED ORDER — IBUPROFEN 400 MG PO TABS: 400.0000 mg | ORAL_TABLET | Freq: Three times a day (TID) | ORAL | 0 refills | Status: DC | PRN

## 2016-10-27 MED ORDER — OXYCODONE-ACETAMINOPHEN 5-325 MG PO TABS
2.0000 | ORAL_TABLET | Freq: Once | ORAL | Status: AC
  Administered 2016-10-27: 2 via ORAL
  Filled 2016-10-27: qty 2

## 2016-10-27 MED ORDER — OXYCODONE-ACETAMINOPHEN 5-325 MG PO TABS: 1.0000 | ORAL_TABLET | ORAL | 0 refills | Status: DC | PRN

## 2016-10-27 MED ORDER — CYCLOBENZAPRINE HCL 10 MG PO TABS
5.0000 mg | ORAL_TABLET | Freq: Once | ORAL | Status: AC
Start: 2016-10-27 — End: 2016-10-27
  Administered 2016-10-27: 5 mg via ORAL
  Filled 2016-10-27: qty 1

## 2016-10-27 MED ORDER — METHOCARBAMOL 500 MG PO TABS: 500.0000 mg | ORAL_TABLET | Freq: Three times a day (TID) | ORAL | 0 refills | Status: DC | PRN

## 2016-10-27 MED ORDER — IBUPROFEN 400 MG PO TABS
600.0000 mg | ORAL_TABLET | Freq: Once | ORAL | Status: AC
  Administered 2016-10-27: 600 mg via ORAL
  Filled 2016-10-27: qty 1

## 2016-10-27 NOTE — ED Provider Notes (Signed)
[facility] DEPT Provider Note   CSN: [id number] Arrival date & time: 10/26/16  1947  By signing my name below, I, Higinio Plan, attest that this documentation has been prepared under the direction and in the presence of Jola Schmidt, MD . Electronically Signed: Higinio Plan, Scribe. 10/27/2016. 12:58 AM.  History   Chief Complaint Chief Complaint  Patient presents with  . Fall   The history is provided by the patient. No language interpreter was used.   HPI Comments: Dylan [name]. is a 75 y.o. male with PMHx of DM and HTN, who presents to the Emergency Department complaining of gradually worsening, lower back pain s/p a fall that began at ~12:00 PM yesterday afternoon. Pt reports he was standing on the second step of a four rung ladder when he suddenly slipped and fell backwards. He denies any head injury or loss of consciousness. He states his pain is exacerbated with movement and when shifting from a seated to standing position. He denies weakness in his lower extremities, neck pain, chest pain, shortness of breath and abdominal pain.   Past Medical History:  Diagnosis Date  . Diabetes mellitus without complication ([place])   . Gout   . Hypertension   . Peripheral arterial disease ([place])   . Second degree AV block     Patient Active Problem List   Diagnosis Date Noted  . 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[place]) 06/23/2015  . Peripheral arterial disease ([place]) 05/13/2014  . Essential hypertension 05/13/2014  . Diabetes ([place]) 05/13/2014  . Second degree AV block 05/13/2014    Past Surgical History:  Procedure Laterality Date  . lipoma surgery     neck - 30 years ago     Home Medications    Prior to Admission medications   Medication Sig Start Date End Date Taking? Authorizing Provider  Ascorbic Acid (VITAMIN C) 1000 MG tablet Take 1,000 mg by mouth daily.    Historical Provider, MD  aspirin 81 MG tablet Take 81 mg by mouth daily.    Historical Provider, MD    Cholecalciferol 5000 UNITS capsule Take 5,000 Units by mouth daily.    Historical Provider, MD  Cyanocobalamin (VITAMIN B-12 IJ) Inject as directed once a week.    Historical Provider, MD  lisinopril (PRINIVIL,ZESTRIL) 10 MG tablet Take 10 mg by mouth daily.    Historical Provider, MD  metFORMIN (GLUCOPHAGE) 1000 MG tablet Take 1,000 mg by mouth 2 (two) times daily with a meal.    Historical Provider, MD  Multiple Vitamin (MULTIVITAMIN) capsule Take 1 capsule by mouth daily.    Historical Provider, MD  pioglitazone (ACTOS) 45 MG tablet Take 45 mg by mouth daily.    Historical Provider, MD  sitaGLIPtin (JANUVIA) 100 MG tablet Take 100 mg by mouth daily.    Historical Provider, MD    Family History No family history on file.  Social History Social History  Substance Use Topics  . Smoking status: Former Research scientist (life sciences)  . Smokeless tobacco: Never Used  . Alcohol use Not on file     Allergies   Patient has no known allergies.   Review of Systems Review of Systems  Respiratory: Negative for shortness of breath.   Cardiovascular: Negative for chest pain.  Gastrointestinal: Negative for abdominal pain.  Musculoskeletal: Positive for back pain. Negative for neck pain.  Neurological: Negative for syncope and weakness.  All other systems reviewed and are negative.  Physical Exam Updated Vital Signs BP 179/62   Pulse 78   Temp  97.6 F (36.4 C)   Resp 18   Ht 5\' 11"  (1.803 m)   Wt 230 lb (104.3 kg)   SpO2 98%   BMI 32.08 kg/m   Physical Exam  Constitutional: He is oriented to person, place, and time. He appears well-developed and well-nourished.  HENT:  Head: Normocephalic and atraumatic.  Eyes: EOM are normal.  Neck: Normal range of motion.  Cardiovascular: Normal rate, regular rhythm, normal heart sounds and intact distal pulses.   Pulmonary/Chest: Effort normal and breath sounds normal. No respiratory distress.  Abdominal: Soft. He exhibits no distension. There is no  tenderness.  Musculoskeletal: Normal range of motion.  Mild lumbar and paralumbar tenderness.   Neurological: He is alert and oriented to person, place, and time.  5/5 strength to BLE and major muscle groups.   Skin: Skin is warm and dry.  Psychiatric: He has a normal mood and affect. Judgment normal.  Nursing note and vitals reviewed.  ED Treatments / Results  Labs (all labs ordered are listed, but only abnormal results are displayed) Labs Reviewed - No data to display  EKG  EKG Interpretation None       Radiology Dg Thoracic Spine 2 View  Result Date: 10/27/2016 CLINICAL DATA:  Status post fall off ladder, with mid to lower back pain. Initial encounter. EXAM: THORACIC SPINE 2 VIEWS COMPARISON:  None. FINDINGS: There is no evidence of fracture or subluxation. Vertebral bodies demonstrate normal height and alignment. Intervertebral disc spaces are preserved. Lateral and anterior osteophytes are seen along the thoracic spine. The visualized portions of both lungs are clear. The mediastinum is unremarkable in appearance. IMPRESSION: No evidence of fracture or subluxation along the thoracic spine.  CT Electronically Signed   By: Garald Balding M.D.   On: 10/27/2016 01:36   Dg Lumbar Spine Complete  Result Date: 10/27/2016 CLINICAL DATA:  Status post fall off ladder, with mid to lower back pain. Initial encounter. EXAM: LUMBAR SPINE - COMPLETE 4+ VIEW COMPARISON:  None. FINDINGS: There is compression deformity at vertebral body L4, of indeterminate age. Vertebral bodies demonstrate normal alignment. Intervertebral disc spaces are preserved. Anterior osteophytes are seen along the lumbar spine. The visualized bowel gas pattern is unremarkable in appearance; air and stool are noted within the colon. The sacroiliac joints are within normal limits. Scattered calcification is seen along the abdominal aorta and its branches. IMPRESSION: 1. Compression deformity at vertebral body L4, of  indeterminate age. If there are associated focal symptoms, CT could be considered for further evaluation. 2. Scattered aortic atherosclerosis. Electronically Signed   By: Garald Balding M.D.   On: 10/27/2016 01:35    Procedures Procedures (including critical care time)  Medications Ordered in ED Medications - No data to display  DIAGNOSTIC STUDIES:  Oxygen Saturation is 98% on RA, normal by my interpretation.    COORDINATION OF CARE:  12:55 AM Discussed treatment plan with pt at bedside and pt agreed to plan.  Initial Impression / Assessment and Plan / ED Course  I have reviewed the triage vital signs and the nursing notes.  Pertinent labs & imaging results that were available during my care of the patient were reviewed by me and considered in my medical decision making (see chart for details).  Clinical Course     1:50 AM Feels better this time.  Clinically the patient has a lumbar compression fracture which appears confirmed on x-ray with an L4 compression fracture.  This is consistent with where his pain is.  No weakness in his lower extremities.  Home with pain medications.  Primary care follow-up.  If his symptoms do not improve with standard medication and he may benefit from referral to interventional radiology for possible vertebroplasty  I personally performed the services described in this documentation, which was scribed in my presence. The recorded information has been reviewed and is accurate.      Final Clinical Impressions(s) / ED Diagnoses   Final diagnoses:  Closed compression fracture of fourth lumbar vertebra, initial encounter (HCC)    New Prescriptions New Prescriptions   IBUPROFEN (ADVIL,MOTRIN) 400 MG TABLET    Take 1 tablet (400 mg total) by mouth every 8 (eight) hours as needed.   METHOCARBAMOL (ROBAXIN) 500 MG TABLET    Take 1 tablet (500 mg total) by mouth every 8 (eight) hours as needed for muscle spasms.   OXYCODONE-ACETAMINOPHEN  (PERCOCET/ROXICET) 5-325 MG TABLET    Take 1 tablet by mouth every 4 (four) hours as needed for severe pain.     Jola Schmidt, MD 10/27/16 [phone]

## 2016-10-27 NOTE — ED Notes (Signed)
Pt reports that his pain is better

## 2016-10-27 NOTE — ED Notes (Signed)
[facility]  NUMBER  [phone].  SHE IS GOING HOME WILL RETURN TOMORROW

## 2016-10-27 NOTE — ED Notes (Signed)
The pt fell 3 feet from a ladder at 1200n today  He was inside the house replacing an air filter   C/o back pain in his lower spine since the fall no previous back pain

## 2016-10-27 NOTE — ED Notes (Signed)
The pts  Pain is not any better

## 2016-10-27 NOTE — ED Notes (Signed)
Pt returned from xray

## 2016-10-27 NOTE — ED Notes (Signed)
To x-ray

## 2016-10-27 NOTE — ED Notes (Signed)
Pain med given  Pt to xray

## 2016-11-04 DIAGNOSIS — M545 Low back pain: Secondary | ICD-10-CM | POA: Diagnosis not present

## 2016-11-09 DIAGNOSIS — S32048A Other fracture of fourth lumbar vertebra, initial encounter for closed fracture: Secondary | ICD-10-CM | POA: Diagnosis not present

## 2016-11-14 DIAGNOSIS — R0602 Shortness of breath: Secondary | ICD-10-CM | POA: Diagnosis not present

## 2016-11-14 DIAGNOSIS — I999 Unspecified disorder of circulatory system: Secondary | ICD-10-CM | POA: Diagnosis not present

## 2016-11-14 DIAGNOSIS — R5383 Other fatigue: Secondary | ICD-10-CM | POA: Diagnosis not present

## 2016-11-14 DIAGNOSIS — I10 Essential (primary) hypertension: Secondary | ICD-10-CM | POA: Diagnosis not present

## 2016-11-14 DIAGNOSIS — I441 Atrioventricular block, second degree: Secondary | ICD-10-CM | POA: Diagnosis not present

## 2016-11-21 DIAGNOSIS — R9431 Abnormal electrocardiogram [ECG] [EKG]: Secondary | ICD-10-CM | POA: Diagnosis not present

## 2016-11-21 DIAGNOSIS — R0602 Shortness of breath: Secondary | ICD-10-CM | POA: Diagnosis not present

## 2016-11-24 DIAGNOSIS — I10 Essential (primary) hypertension: Secondary | ICD-10-CM | POA: Diagnosis not present

## 2016-11-24 DIAGNOSIS — R0989 Other specified symptoms and signs involving the circulatory and respiratory systems: Secondary | ICD-10-CM | POA: Diagnosis not present

## 2016-11-24 DIAGNOSIS — R9431 Abnormal electrocardiogram [ECG] [EKG]: Secondary | ICD-10-CM | POA: Diagnosis not present

## 2016-11-24 DIAGNOSIS — R0602 Shortness of breath: Secondary | ICD-10-CM | POA: Diagnosis not present

## 2016-12-01 DIAGNOSIS — I10 Essential (primary) hypertension: Secondary | ICD-10-CM | POA: Diagnosis not present

## 2016-12-01 DIAGNOSIS — R0602 Shortness of breath: Secondary | ICD-10-CM | POA: Diagnosis not present

## 2016-12-01 DIAGNOSIS — R5381 Other malaise: Secondary | ICD-10-CM | POA: Diagnosis not present

## 2016-12-01 DIAGNOSIS — R5383 Other fatigue: Secondary | ICD-10-CM | POA: Diagnosis not present

## 2016-12-05 DIAGNOSIS — M545 Low back pain: Secondary | ICD-10-CM | POA: Diagnosis not present

## 2016-12-05 DIAGNOSIS — M5416 Radiculopathy, lumbar region: Secondary | ICD-10-CM | POA: Diagnosis not present

## 2016-12-12 DIAGNOSIS — M545 Low back pain: Secondary | ICD-10-CM | POA: Diagnosis not present

## 2016-12-26 DIAGNOSIS — M545 Low back pain: Secondary | ICD-10-CM | POA: Diagnosis not present

## 2017-01-10 DIAGNOSIS — M545 Low back pain: Secondary | ICD-10-CM | POA: Diagnosis not present

## 2017-01-30 ENCOUNTER — Other Ambulatory Visit: Payer: Self-pay | Admitting: Orthopedic Surgery

## 2017-02-09 ENCOUNTER — Encounter (HOSPITAL_COMMUNITY)
Admission: RE | Admit: 2017-02-09 | Discharge: 2017-02-09 | Disposition: A | Discharge status: Home / Self Care | Payer: Medicare HMO | Source: Ambulatory Visit | Attending: Orthopedic Surgery | Admitting: Orthopedic Surgery

## 2017-02-09 ENCOUNTER — Encounter (HOSPITAL_COMMUNITY): Payer: Self-pay

## 2017-02-09 DIAGNOSIS — E1122 Type 2 diabetes mellitus with diabetic chronic kidney disease: Secondary | ICD-10-CM | POA: Diagnosis not present

## 2017-02-09 DIAGNOSIS — Z01818 Encounter for other preprocedural examination: Secondary | ICD-10-CM | POA: Insufficient documentation

## 2017-02-09 DIAGNOSIS — N08 Glomerular disorders in diseases classified elsewhere: Secondary | ICD-10-CM | POA: Diagnosis not present

## 2017-02-09 DIAGNOSIS — Z01812 Encounter for preprocedural laboratory examination: Secondary | ICD-10-CM | POA: Diagnosis not present

## 2017-02-09 DIAGNOSIS — I129 Hypertensive chronic kidney disease with stage 1 through stage 4 chronic kidney disease, or unspecified chronic kidney disease: Secondary | ICD-10-CM | POA: Diagnosis not present

## 2017-02-09 DIAGNOSIS — I700 Atherosclerosis of aorta: Secondary | ICD-10-CM | POA: Insufficient documentation

## 2017-02-09 DIAGNOSIS — J40 Bronchitis, not specified as acute or chronic: Secondary | ICD-10-CM | POA: Diagnosis not present

## 2017-02-09 DIAGNOSIS — N182 Chronic kidney disease, stage 2 (mild): Secondary | ICD-10-CM | POA: Diagnosis not present

## 2017-02-09 HISTORY — DX: Nonrheumatic aortic (valve) stenosis: I35.0

## 2017-02-09 LAB — COMPREHENSIVE METABOLIC PANEL
ALBUMIN: 3.7 g/dL (ref 3.5–5.0)
ALT: 21 U/L (ref 17–63)
AST: 28 U/L (ref 15–41)
Alkaline Phosphatase: 95 U/L (ref 38–126)
Anion gap: 10 (ref 5–15)
BUN: 16 mg/dL (ref 6–20)
CHLORIDE: 106 mmol/L (ref 101–111)
CO2: 24 mmol/L (ref 22–32)
CREATININE: 1.24 mg/dL (ref 0.61–1.24)
Calcium: 9.6 mg/dL (ref 8.9–10.3)
GFR calc non Af Amer: 55 mL/min — ABNORMAL LOW (ref >60)
Glucose, Bld: 130 mg/dL — ABNORMAL HIGH (ref 65–99)
Potassium: 3.9 mmol/L (ref 3.5–5.1)
SODIUM: 140 mmol/L (ref 135–145)
TOTAL PROTEIN: 7.3 g/dL (ref 6.5–8.1)
Total Bilirubin: 0.5 mg/dL (ref 0.3–1.2)

## 2017-02-09 LAB — APTT: APTT: 33 s (ref 24–36)

## 2017-02-09 LAB — URINALYSIS, ROUTINE W REFLEX MICROSCOPIC
BILIRUBIN URINE: NEGATIVE
GLUCOSE, UA: NEGATIVE mg/dL
HGB URINE DIPSTICK: NEGATIVE
KETONES UR: 15 mg/dL — AB
LEUKOCYTES UA: NEGATIVE
Nitrite: NEGATIVE
PH: 5.5 (ref 5.0–8.0)
PROTEIN: 30 mg/dL — AB
Specific Gravity, Urine: >1.03 — ABNORMAL HIGH (ref 1.005–1.030)

## 2017-02-09 LAB — PROTIME-INR
INR: 1.02
PROTHROMBIN TIME: 13.4 s (ref 11.4–15.2)

## 2017-02-09 LAB — CBC WITH DIFFERENTIAL/PLATELET
BASOS ABS: 0 10*3/uL (ref 0.0–0.1)
BASOS PCT: 1 %
EOS ABS: 0.3 10*3/uL (ref 0.0–0.7)
EOS PCT: 6 %
HCT: 38.3 % — ABNORMAL LOW (ref 39.0–52.0)
Hemoglobin: 12.4 g/dL — ABNORMAL LOW (ref 13.0–17.0)
Lymphocytes Relative: 30 %
Lymphs Abs: 1.5 10*3/uL (ref 0.7–4.0)
MCH: 28.6 pg (ref 26.0–34.0)
MCHC: 32.4 g/dL (ref 30.0–36.0)
MCV: 88.5 fL (ref 78.0–100.0)
Monocytes Absolute: 0.3 10*3/uL (ref 0.1–1.0)
Monocytes Relative: 5 %
Neutro Abs: 2.8 10*3/uL (ref 1.7–7.7)
Neutrophils Relative %: 58 %
PLATELETS: 215 10*3/uL (ref 150–400)
RBC: 4.33 MIL/uL (ref 4.22–5.81)
RDW: 14.3 % (ref 11.5–15.5)
WBC: 4.8 10*3/uL (ref 4.0–10.5)

## 2017-02-09 LAB — HEMOGLOBIN A1C
HEMOGLOBIN A1C: 7.3 % — AB (ref 4.8–5.6)
Mean Plasma Glucose: 163 mg/dL

## 2017-02-09 LAB — URINALYSIS, MICROSCOPIC (REFLEX)
RBC / HPF: NONE SEEN RBC/hpf (ref 0–5)
Squamous Epithelial / LPF: NONE SEEN

## 2017-02-09 LAB — SURGICAL PCR SCREEN
MRSA, PCR: NEGATIVE
STAPHYLOCOCCUS AUREUS: NEGATIVE

## 2017-02-09 LAB — HM DEXA SCAN: HM DEXA SCAN: NORMAL

## 2017-02-09 LAB — GLUCOSE, CAPILLARY: Glucose-Capillary: 123 mg/dL — ABNORMAL HIGH (ref 65–99)

## 2017-02-09 NOTE — Progress Notes (Signed)
Pt denies chest pain, sob.  Reports saw Dr [name] [facility] and Feb 2018.

## 2017-02-09 NOTE — Pre-Procedure Instructions (Signed)
Dylan [name].  02/09/2017      [id number] [place],  - [address] [place] [address] Phone: [phone] Fax: [phone]    Your procedure is scheduled on Wednesday, April 18.  Report to [facility] Admitting at 10:30 AM               Your surgery or procedure is scheduled for 12:30 PM   Call this number if you have problems the morning of surgery: [phone]               For any other questions, please call [phone], Monday - Friday 8 AM - 4 PM.     Remember:  Do not eat food or drink liquids after midnight Tuesday, April 17.  Take these medicines the morning of surgery with A SIP OF WATER : amLODipine (NORVASC).                Take if needed: HYDROcodone-acetaminophen (NORCO/VICODIN),  cyclobenzaprine (FLEXERIL).                     DO NOT take medications for diabetes the morning of surgery.  How to Manage Your Diabetes Before and After Surgery  Why is it important to control my blood sugar before and after surgery? . Improving blood sugar levels before and after surgery helps healing and can limit problems. . A way of improving blood sugar control is eating a healthy diet by: o  Eating less sugar and carbohydrates o  Increasing activity/exercise o  Talking with your doctor about reaching your blood sugar goals . High blood sugars (greater than 180 mg/dL) can raise your risk of infections and slow your recovery, so you will need to focus on controlling your diabetes during the weeks before surgery. . Make sure that the doctor who takes care of your diabetes knows about your planned surgery including the date and location.  How do I manage my blood sugar before surgery? . Check your blood sugar at least 4 times a day, starting 2 days before surgery, to make sure that the level is not too high or low. o Check your blood sugar the morning of your surgery when you wake up and  every 2 hours until you get to the Short Stay unit. . If your blood sugar is less than 70 mg/dL, you will need to treat for low blood sugar: o Do not take insulin. o Treat a low blood sugar (less than 70 mg/dL) with  cup of clear juice (cranberry or apple), 4 glucose tablets, OR glucose gel. o Recheck blood sugar in 15 minutes after treatment (to make sure it is greater than 70 mg/dL). If your blood sugar is not greater than 70 mg/dL on recheck, call [phone] for further instructions. . Report your blood sugar to the short stay nurse when you get to Short Stay.  . If you are admitted to the hospital after surgery: o Your blood sugar will be checked by the staff and you will probably be given insulin after surgery (instead of oral diabetes medicines) to make sure you have good blood sugar levels. o The goal for blood sugar control after surgery is 80-180 mg/dL.  WHAT DO I DO ABOUT MY DIABETES MEDICATION?   [name] Do not take oral diabetes medicines (pills) the morning of surgery.     Patient Signature:  Date:   Nurse Signature:  Date:    Do not wear jewelry, make-up or nail polish.  Do not wear lotions, powders, or perfumes, or deodorant.  Do not shave 48 hours prior to surgery.  Men may shave face and neck.  Do not bring valuables to the hospital.  [facility] is not responsible for any belongings or valuables.  Contacts, dentures or bridgework may not be worn into surgery.  Leave your suitcase in the car.  After surgery it may be brought to your room.  For patients admitted to the hospital, discharge time will be determined by your treatment team.  Patients discharged the day of surgery will not be allowed to drive home.   Name and phone number of your driver:  -  Special instructions: Review   - Preparing For Surgery.  Please read over the following fact sheets that you were given: [facility]- Preparing For Surgery and Patient Instructions for Mupirocin Application,  Coughing and Deep Breathing, Pain Booklet

## 2017-02-10 ENCOUNTER — Encounter (HOSPITAL_COMMUNITY): Payer: Self-pay

## 2017-02-10 NOTE — Progress Notes (Signed)
Anesthesia Chart Review: Patient is a 76 year old male scheduled for L4 kyphoplasty on 02/15/17 by Dr. Lynann Bologna.  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 [name] 12/01/16), gout, lipoma surgery.   PCP is Dr. [name]. Cardiologist is Dr. [name] at [facility] ([facility]), last visit 12/01/16.   Meds include allopurinol, amlodipine, aspirin 81 mg (on hold), Flexeril, Norco, metformin, Robaxin, Actos, Pravachol, Januvia, valsartan.  BP (!) 139/52   Pulse (!) 54   Temp 36.8 C   Resp 18   Ht 5\' 11"  (1.803 m)   Wt 208 lb 3.2 oz (94.4 kg)   SpO2 98%   BMI 29.04 kg/m   EKG 11/14/16 ([facility]): SR, 2nd degree AV block, type I (Wenckebach).  Echo 11/24/16 ([facility]): Conclusion: 1. Left ventricle cavity is normal in size. Moderate concentric hypertrophy of the left ventricle. Normal global wall motion. Visual EF of 55-60%. Calculated EF 55%. 2. Left atrial cavity is mild to moderately dilated. 3. Right atrial cavity is moderately dilated. 4. Right ventricle cavity is mildly dilated. Normal right ventricular function. 5. Trace aortic regurgitation. Mild calcification of the aortic valve annulus. Moderate aortic valve leaflet calcification. Mildly restricted aortic valve leaflets. Mild aortic valve stenosis with peak gradient 24 mmHg and mean gradient 11 mmHg. 6. Mild to moderate mitral regurgitation. Mild calcification the mitral valve annulus. Has occasional chordae tendineae is seen. 7. Moderate tricuspid regurgitation. Moderate pulmonary hypertension with approximate PA systolic pressure 49 mmHg.  Nuclear stress test 11/21/16 ([facility]): Impression: 1. Resting EKG demonstrates sinus rhythm Mobitz 1 second-degree AV block, ventricular rate 52 bpm. stress EKG was nondiagnostic for ischemia as it a pharmacologic stress test. During peak Lexiscan infusion, patient's maximum heart rate was 76 bpm appear to be in sinus rhythm  without heart block. There are rare PVCs. Stress symptoms included dyspnea, warmness in the chest. 2. The left ventricle was noted to be widely dilated with definitely end diastolic volume of 492 mL. The perfusion images reveal normal perfusion in all vascular territories. Left ventricular systolic function calculated by QGS was normal at 52% without regional wall motion abnormality. This represents a low risk study. Carotid U/S 11/24/16 ([facility]): Conclusions: Bilateral carotid arteries show moderate mixed plaque without significant stenosis, 1-15%. Antegrade vertebral artery flow.  CXR 02/09/17: IMPRESSION: Minimal bronchitic changes without acute infiltrate. Aortic atherosclerosis  Preoperative labs noted. A1c 7.3.   Patient with known 2nd degree AVB type I, but with recent cardiology evaluation with no indication for PPM at that time. If no acute changes then I would anticipate that he could proceed as planned. Could consider placing pacing pads perioperatively as a precaution, but would defer to anesthesiologist. Above reviewed with anesthesiologist Dr. Kalman Shan. Further evaluation by his surgeon and anesthesiologist on the day of surgery.  George Hugh [facility] Short Stay Center/Anesthesiology Phone [phone] 02/10/2017 5:37 PM

## 2017-02-15 ENCOUNTER — Encounter (HOSPITAL_COMMUNITY)
Admission: RE | Disposition: A | Discharge status: Home / Self Care | Payer: Self-pay | Source: Ambulatory Visit | Attending: Orthopedic Surgery

## 2017-02-15 ENCOUNTER — Encounter (HOSPITAL_COMMUNITY): Payer: Self-pay

## 2017-02-15 ENCOUNTER — Ambulatory Visit (HOSPITAL_COMMUNITY): Payer: Medicare HMO | Admitting: Vascular Surgery

## 2017-02-15 ENCOUNTER — Ambulatory Visit (HOSPITAL_COMMUNITY): Payer: Medicare HMO

## 2017-02-15 ENCOUNTER — Ambulatory Visit (HOSPITAL_COMMUNITY)
Admission: RE | Admit: 2017-02-15 | Discharge: 2017-02-15 | Disposition: A | Discharge status: Home / Self Care | Payer: Medicare HMO | Source: Ambulatory Visit | Attending: Orthopedic Surgery | Admitting: Orthopedic Surgery

## 2017-02-15 ENCOUNTER — Ambulatory Visit (HOSPITAL_COMMUNITY): Payer: Medicare HMO | Admitting: Anesthesiology

## 2017-02-15 DIAGNOSIS — Z7982 Long term (current) use of aspirin: Secondary | ICD-10-CM | POA: Insufficient documentation

## 2017-02-15 DIAGNOSIS — M109 Gout, unspecified: Secondary | ICD-10-CM | POA: Insufficient documentation

## 2017-02-15 DIAGNOSIS — Z79899 Other long term (current) drug therapy: Secondary | ICD-10-CM | POA: Insufficient documentation

## 2017-02-15 DIAGNOSIS — E1151 Type 2 diabetes mellitus with diabetic peripheral angiopathy without gangrene: Secondary | ICD-10-CM | POA: Insufficient documentation

## 2017-02-15 DIAGNOSIS — Z7984 Long term (current) use of oral hypoglycemic drugs: Secondary | ICD-10-CM | POA: Insufficient documentation

## 2017-02-15 DIAGNOSIS — S32040A Wedge compression fracture of fourth lumbar vertebra, initial encounter for closed fracture: Secondary | ICD-10-CM | POA: Diagnosis not present

## 2017-02-15 DIAGNOSIS — W19XXXA Unspecified fall, initial encounter: Secondary | ICD-10-CM | POA: Insufficient documentation

## 2017-02-15 DIAGNOSIS — Z87891 Personal history of nicotine dependence: Secondary | ICD-10-CM | POA: Insufficient documentation

## 2017-02-15 DIAGNOSIS — M4856XA Collapsed vertebra, not elsewhere classified, lumbar region, initial encounter for fracture: Secondary | ICD-10-CM | POA: Diagnosis not present

## 2017-02-15 DIAGNOSIS — S32049A Unspecified fracture of fourth lumbar vertebra, initial encounter for closed fracture: Secondary | ICD-10-CM | POA: Insufficient documentation

## 2017-02-15 DIAGNOSIS — Z419 Encounter for procedure for purposes other than remedying health state, unspecified: Secondary | ICD-10-CM

## 2017-02-15 DIAGNOSIS — Z79891 Long term (current) use of opiate analgesic: Secondary | ICD-10-CM | POA: Insufficient documentation

## 2017-02-15 DIAGNOSIS — S32040G Wedge compression fracture of fourth lumbar vertebra, subsequent encounter for fracture with delayed healing: Secondary | ICD-10-CM

## 2017-02-15 DIAGNOSIS — I10 Essential (primary) hypertension: Secondary | ICD-10-CM | POA: Insufficient documentation

## 2017-02-15 HISTORY — PX: KYPHOPLASTY: SHX5884

## 2017-02-15 LAB — GLUCOSE, CAPILLARY: GLUCOSE-CAPILLARY: 155 mg/dL — AB (ref 65–99)

## 2017-02-15 SURGERY — KYPHOPLASTY
Anesthesia: General

## 2017-02-15 MED ORDER — FENTANYL CITRATE (PF) 250 MCG/5ML IJ SOLN
INTRAMUSCULAR | Status: AC
  Filled 2017-02-15: qty 5

## 2017-02-15 MED ORDER — EPHEDRINE SULFATE-NACL 50-0.9 MG/10ML-% IV SOSY
PREFILLED_SYRINGE | INTRAVENOUS | Status: DC | PRN
  Administered 2017-02-15: 5 mg via INTRAVENOUS

## 2017-02-15 MED ORDER — POVIDONE-IODINE 7.5 % EX SOLN: Freq: Once | CUTANEOUS | Status: DC

## 2017-02-15 MED ORDER — CEFAZOLIN SODIUM-DEXTROSE 2-4 GM/100ML-% IV SOLN
2.0000 g | INTRAVENOUS | Status: AC
  Administered 2017-02-15: 2 g via INTRAVENOUS

## 2017-02-15 MED ORDER — FENTANYL CITRATE (PF) 100 MCG/2ML IJ SOLN: 25.0000 ug | INTRAMUSCULAR | Status: DC | PRN

## 2017-02-15 MED ORDER — FENTANYL CITRATE (PF) 100 MCG/2ML IJ SOLN
INTRAMUSCULAR | Status: DC | PRN
  Administered 2017-02-15: 50 ug via INTRAVENOUS
  Administered 2017-02-15: 100 ug via INTRAVENOUS
  Administered 2017-02-15: 50 ug via INTRAVENOUS

## 2017-02-15 MED ORDER — EPINEPHRINE PF 1 MG/ML IJ SOLN
INTRAMUSCULAR | Status: DC | PRN
  Administered 2017-02-15: .1 mL

## 2017-02-15 MED ORDER — BACITRACIN 500 UNIT/GM EX OINT
TOPICAL_OINTMENT | CUTANEOUS | Status: DC | PRN
  Administered 2017-02-15: 1 via TOPICAL

## 2017-02-15 MED ORDER — LACTATED RINGERS IV SOLN: INTRAVENOUS | Status: DC

## 2017-02-15 MED ORDER — SUGAMMADEX SODIUM 200 MG/2ML IV SOLN
INTRAVENOUS | Status: DC | PRN
  Administered 2017-02-15: 200 mg via INTRAVENOUS

## 2017-02-15 MED ORDER — ROCURONIUM BROMIDE 10 MG/ML (PF) SYRINGE
PREFILLED_SYRINGE | INTRAVENOUS | Status: DC | PRN
  Administered 2017-02-15: 10 mg via INTRAVENOUS
  Administered 2017-02-15: 40 mg via INTRAVENOUS

## 2017-02-15 MED ORDER — PROPOFOL 10 MG/ML IV BOLUS
INTRAVENOUS | Status: DC | PRN
  Administered 2017-02-15: 130 mg via INTRAVENOUS

## 2017-02-15 MED ORDER — BUPIVACAINE HCL (PF) 0.25 % IJ SOLN
INTRAMUSCULAR | Status: AC
  Filled 2017-02-15: qty 30

## 2017-02-15 MED ORDER — ROCURONIUM BROMIDE 50 MG/5ML IV SOSY
PREFILLED_SYRINGE | INTRAVENOUS | Status: AC
  Filled 2017-02-15: qty 5

## 2017-02-15 MED ORDER — BUPIVACAINE LIPOSOME 1.3 % IJ SUSP
20.0000 mL | Freq: Once | INTRAMUSCULAR | Status: DC
  Filled 2017-02-15: qty 20

## 2017-02-15 MED ORDER — EPHEDRINE 5 MG/ML INJ
INTRAVENOUS | Status: AC
  Filled 2017-02-15: qty 10

## 2017-02-15 MED ORDER — BACITRACIN ZINC 500 UNIT/GM EX OINT
TOPICAL_OINTMENT | CUTANEOUS | Status: AC
  Filled 2017-02-15: qty 28.35

## 2017-02-15 MED ORDER — PROPOFOL 10 MG/ML IV BOLUS
INTRAVENOUS | Status: AC
  Filled 2017-02-15: qty 20

## 2017-02-15 MED ORDER — LIDOCAINE 2% (20 MG/ML) 5 ML SYRINGE
INTRAMUSCULAR | Status: AC
  Filled 2017-02-15: qty 5

## 2017-02-15 MED ORDER — CEFAZOLIN SODIUM-DEXTROSE 2-4 GM/100ML-% IV SOLN
INTRAVENOUS | Status: AC
  Filled 2017-02-15: qty 100

## 2017-02-15 MED ORDER — IOPAMIDOL (ISOVUE-300) INJECTION 61%
INTRAVENOUS | Status: DC | PRN
  Administered 2017-02-15: 50 mL via INTRAVENOUS

## 2017-02-15 MED ORDER — LACTATED RINGERS IV SOLN
INTRAVENOUS | Status: DC
  Administered 2017-02-15: 10:00:00 via INTRAVENOUS

## 2017-02-15 MED ORDER — EPINEPHRINE PF 1 MG/ML IJ SOLN
INTRAMUSCULAR | Status: AC
  Filled 2017-02-15: qty 1

## 2017-02-15 MED ORDER — 0.9 % SODIUM CHLORIDE (POUR BTL) OPTIME
TOPICAL | Status: DC | PRN
  Administered 2017-02-15: 1000 mL

## 2017-02-15 MED ORDER — PHENYLEPHRINE 40 MCG/ML (10ML) SYRINGE FOR IV PUSH (FOR BLOOD PRESSURE SUPPORT)
PREFILLED_SYRINGE | INTRAVENOUS | Status: AC
  Filled 2017-02-15: qty 10

## 2017-02-15 MED ORDER — IOPAMIDOL (ISOVUE-300) INJECTION 61%
INTRAVENOUS | Status: AC
  Filled 2017-02-15: qty 50

## 2017-02-15 MED ORDER — LACTATED RINGERS IV SOLN
INTRAVENOUS | Status: DC | PRN
  Administered 2017-02-15 (×2): via INTRAVENOUS

## 2017-02-15 MED ORDER — ONDANSETRON HCL 4 MG/2ML IJ SOLN
INTRAMUSCULAR | Status: DC | PRN
  Administered 2017-02-15: 4 mg via INTRAVENOUS

## 2017-02-15 MED ORDER — LIDOCAINE 2% (20 MG/ML) 5 ML SYRINGE
INTRAMUSCULAR | Status: DC | PRN
  Administered 2017-02-15: 60 mg via INTRAVENOUS

## 2017-02-15 MED ORDER — SUGAMMADEX SODIUM 200 MG/2ML IV SOLN
INTRAVENOUS | Status: AC
  Filled 2017-02-15: qty 2

## 2017-02-15 MED ORDER — ONDANSETRON HCL 4 MG/2ML IJ SOLN
INTRAMUSCULAR | Status: AC
  Filled 2017-02-15: qty 2

## 2017-02-15 SURGICAL SUPPLY — 45 items
BLADE SURG 15 STRL LF DISP TIS (BLADE) ×1 IMPLANT
BLADE SURG 15 STRL SS (BLADE) ×1
CEMENT BONE KYPHX HV R (Orthopedic Implant) ×2 IMPLANT
COVER MAYO STAND STRL (DRAPES) ×2 IMPLANT
COVER SURGICAL LIGHT HANDLE (MISCELLANEOUS) ×2 IMPLANT
CURETTE EXPRESS SZ2 7MM (INSTRUMENTS) ×1 IMPLANT
CURRETTE EXPRESS SZ2 7MM (INSTRUMENTS) ×2
DRAPE C-ARM 42X72 X-RAY (DRAPES) ×4 IMPLANT
DRAPE HALF SHEET 40X57 (DRAPES) IMPLANT
DRAPE INCISE IOBAN 66X45 STRL (DRAPES) ×2 IMPLANT
DRAPE LAPAROTOMY T 102X78X121 (DRAPES) ×2 IMPLANT
DRAPE SURG 17X23 STRL (DRAPES) ×8 IMPLANT
DURAPREP 26ML APPLICATOR (WOUND CARE) ×2 IMPLANT
GAUZE SPONGE 2X2 8PLY STRL LF (GAUZE/BANDAGES/DRESSINGS) ×1 IMPLANT
GAUZE SPONGE 4X4 12PLY STRL (GAUZE/BANDAGES/DRESSINGS) ×2 IMPLANT
GAUZE SPONGE 4X4 16PLY XRAY LF (GAUZE/BANDAGES/DRESSINGS) ×2 IMPLANT
GLOVE BIO SURGEON STRL SZ 6 (GLOVE) ×2 IMPLANT
GLOVE BIO SURGEON STRL SZ7 (GLOVE) ×2 IMPLANT
GLOVE BIO SURGEON STRL SZ8 (GLOVE) ×2 IMPLANT
GLOVE BIOGEL PI IND STRL 7.0 (GLOVE) ×1 IMPLANT
GLOVE BIOGEL PI IND STRL 8 (GLOVE) ×1 IMPLANT
GLOVE BIOGEL PI INDICATOR 7.0 (GLOVE) ×1
GLOVE BIOGEL PI INDICATOR 8 (GLOVE) ×1
GLOVE INDICATOR 6.5 STRL GRN (GLOVE) ×2 IMPLANT
GOWN STRL REUS W/ TWL LRG LVL3 (GOWN DISPOSABLE) ×2 IMPLANT
GOWN STRL REUS W/ TWL XL LVL3 (GOWN DISPOSABLE) ×1 IMPLANT
GOWN STRL REUS W/TWL LRG LVL3 (GOWN DISPOSABLE) ×2
GOWN STRL REUS W/TWL XL LVL3 (GOWN DISPOSABLE) ×1
KIT BASIN OR (CUSTOM PROCEDURE TRAY) ×2 IMPLANT
KIT ROOM TURNOVER OR (KITS) ×2 IMPLANT
NEEDLE 22X1 1/2 (OR ONLY) (NEEDLE) IMPLANT
NEEDLE HYPO 25X1 1.5 SAFETY (NEEDLE) IMPLANT
NEEDLE SPNL 18GX3.5 QUINCKE PK (NEEDLE) ×4 IMPLANT
NS IRRIG 1000ML POUR BTL (IV SOLUTION) ×2 IMPLANT
PACK UNIVERSAL I (CUSTOM PROCEDURE TRAY) ×2 IMPLANT
PAD ARMBOARD 7.5X6 YLW CONV (MISCELLANEOUS) ×4 IMPLANT
POSITIONER HEAD PRONE TRACH (MISCELLANEOUS) ×2 IMPLANT
SPONGE GAUZE 2X2 STER 10/PKG (GAUZE/BANDAGES/DRESSINGS) ×1
SUT MNCRL AB 4-0 PS2 18 (SUTURE) ×2 IMPLANT
SYR BULB IRRIGATION 50ML (SYRINGE) ×2 IMPLANT
SYR CONTROL 10ML LL (SYRINGE) ×2 IMPLANT
TAPE CLOTH SURG 4X10 WHT LF (GAUZE/BANDAGES/DRESSINGS) ×2 IMPLANT
TOWEL OR 17X24 6PK STRL BLUE (TOWEL DISPOSABLE) ×2 IMPLANT
TOWEL OR 17X26 10 PK STRL BLUE (TOWEL DISPOSABLE) ×2 IMPLANT
TRAY KYPHOPAK 15/3 ONESTEP 1ST (MISCELLANEOUS) ×2 IMPLANT

## 2017-02-15 NOTE — H&P (Signed)
PREOPERATIVE H&P  Chief Complaint: Low back pain  HPI: Dylan [name]. is a 76 y.o. male who presents with ongoing pain in the low back s/p a fall on 10/24/2016  MRI reveals edema in the L4 vertebral body  Patient has failed multiple forms of conservative care and continues to have pain (see office notes for additional details regarding the patient's full course of treatment)  Past Medical History:  Diagnosis Date  . Aortic stenosis    mild AS 11/24/16 (peak grad 24, mean grad 11) Dr. [name]  . Diabetes mellitus without complication ([place])   . Gout   . Hypertension   . Peripheral arterial disease ([place])   . Second degree AV block    Wenckebach; no indication for pacemaker as of 12/01/16 (Dr. [name])   Past Surgical History:  Procedure Laterality Date  . CARDIOVASCULAR STRESS TEST     11/21/16 Low risk study, EF 52% [facility])  . EYE SURGERY    . lipoma surgery     neck - 30 years ago  . TRANSTHORACIC ECHOCARDIOGRAM     11/24/16 [facility]): EF 55-60%, mild AS, mild-mod MR, mod TR, moderate pulm HTN, PAP 49 mmHg   Social History   Social History  . Marital status: Married    Spouse name: N/A  . Number of children: N/A  . Years of education: N/A   Social History Main Topics  . Smoking status: Former Research scientist (life sciences)  . Smokeless tobacco: Never Used  . Alcohol use Not on file  . Drug use: Unknown  . Sexual activity: Not on file   Other Topics Concern  . Not on file   Social History Narrative  . No narrative on file   No family history on file. Allergies  Allergen Reactions  . No Known Allergies    Prior to Admission medications   Medication Sig Start Date End Date Taking? Authorizing Provider  allopurinol (ZYLOPRIM) 100 MG tablet Take 100 mg by mouth daily.   Yes Historical Provider, MD  amLODipine (NORVASC) 5 MG tablet Take 5 mg by mouth every evening.   Yes Historical Provider, MD  Ascorbic Acid (VITAMIN C) 1000 MG tablet Take 1,000 mg by mouth  daily.   Yes Historical Provider, MD  Cholecalciferol 5000 UNITS capsule Take 5,000 Units by mouth every evening.    Yes Historical Provider, MD  cyclobenzaprine (FLEXERIL) 5 MG tablet Take 5 mg by mouth every 12 (twelve) hours.   Yes Historical Provider, MD  HYDROcodone-acetaminophen (NORCO/VICODIN) 5-325 MG tablet Take 1 tablet by mouth every 12 (twelve) hours. Takes with flexerill   Yes Historical Provider, MD  metFORMIN (GLUCOPHAGE) 1000 MG tablet Take 1,000 mg by mouth 2 (two) times daily with a meal.   Yes Historical Provider, MD  pioglitazone (ACTOS) 45 MG tablet Take 45 mg by mouth every evening.    Yes Historical Provider, MD  pravastatin (PRAVACHOL) 20 MG tablet Take 20 mg by mouth every evening.   Yes Historical Provider, MD  sitaGLIPtin (JANUVIA) 100 MG tablet Take 100 mg by mouth daily.   Yes Historical Provider, MD  valsartan (DIOVAN) 320 MG tablet Take 320 mg by mouth daily.   Yes Historical Provider, MD  aspirin 81 MG tablet Take 81 mg by mouth daily.    Historical Provider, MD  ibuprofen (ADVIL,MOTRIN) 400 MG tablet Take 1 tablet (400 mg total) by mouth every 8 (eight) hours as needed. Patient not taking: Reported on 02/08/2017 10/27/16   Jola Schmidt,  MD  methocarbamol (ROBAXIN) 500 MG tablet Take 1 tablet (500 mg total) by mouth every 8 (eight) hours as needed for muscle spasms. Patient not taking: Reported on 02/08/2017 10/27/16   Jola Schmidt, MD  oxyCODONE-acetaminophen (PERCOCET/ROXICET) 5-325 MG tablet Take 1 tablet by mouth every 4 (four) hours as needed for severe pain. Patient not taking: Reported on 02/08/2017 10/27/16   Jola Schmidt, MD     All other systems have been reviewed and were otherwise negative with the exception of those mentioned in the HPI and as above.  Physical Exam: There were no vitals filed for this visit.  General: Alert, no acute distress Cardiovascular: No pedal edema Respiratory: No cyanosis, no use of accessory musculature Skin: No lesions  in the area of chief complaint Neurologic: Sensation intact distally Psychiatric: Patient is competent for consent with normal mood and affect Lymphatic: No axillary or cervical lymphadenopathy  MUSCULOSKELETAL: + TTP at low back  Assessment/Plan: Lumbar 4 compression fracture Plan for Procedure(s): LUMBAR 4 KYPHOPLASTY   Sinclair Ship, MD 02/15/2017 7:36 AM

## 2017-02-15 NOTE — Anesthesia Postprocedure Evaluation (Signed)
Anesthesia Post Note  Patient: Dylan [name].  Procedure(s) Performed: Procedure(s) (LRB): LUMBAR FOUR KYPHOPLASTY (N/A)  Patient location during evaluation: PACU Anesthesia Type: General Level of consciousness: awake and alert Pain management: pain level controlled Vital Signs Assessment: post-procedure vital signs reviewed and stable Respiratory status: spontaneous breathing, nonlabored ventilation, respiratory function stable and patient connected to nasal cannula oxygen Cardiovascular status: blood pressure returned to baseline and stable Postop Assessment: no signs of nausea or vomiting Anesthetic complications: no       Last Vitals:  Vitals:   02/15/17 1252 02/15/17 1304  BP: (!) 156/52 (!) 158/65  Pulse: (!) 48 (!) 49  Resp: 15 18  Temp:      Last Pain:  Vitals:   02/15/17 1250  PainSc: 0-No pain                 Tiajuana Amass

## 2017-02-15 NOTE — Anesthesia Procedure Notes (Signed)
Procedure Name: Intubation Date/Time: 02/15/2017 11:15 AM Performed by: Garrison [place] T Pre-anesthesia Checklist: Patient identified, Emergency Drugs available, Suction available and Patient being monitored Patient Re-evaluated:Patient Re-evaluated prior to inductionOxygen Delivery Method: Circle System Utilized Preoxygenation: Pre-oxygenation with 100% oxygen Intubation Type: IV induction Ventilation: Mask ventilation without difficulty and Oral airway inserted - appropriate to patient size Laryngoscope Size: Sabra Heck and 2 Grade View: Grade I Tube type: Oral Tube size: 7.5 mm Number of attempts: 1 Airway Equipment and Method: Stylet and Oral airway Placement Confirmation: ETT inserted through vocal cords under direct vision,  positive ETCO2 and breath sounds checked- equal and bilateral Secured at: 22 cm Tube secured with: Tape Dental Injury: Teeth and Oropharynx as per pre-operative assessment

## 2017-02-15 NOTE — Transfer of Care (Signed)
Immediate Anesthesia Transfer of Care Note  Patient: Dylan [name].  Procedure(s) Performed: Procedure(s): LUMBAR FOUR KYPHOPLASTY (N/A)  Patient Location: PACU  Anesthesia Type:General  Level of Consciousness: awake, alert  and oriented  Airway & Oxygen Therapy: Patient Spontanous Breathing  Post-op Assessment: Report given to RN, Post -op Vital signs reviewed and stable and Patient moving all extremities X 4  Post vital signs: Reviewed and stable  Last Vitals:  Vitals:   02/15/17 0906  BP: (!) 164/58  Pulse: 61  Resp: 20  Temp: 37 C    Last Pain:  Vitals:   02/15/17 0927  PainSc: 1       Patients Stated Pain Goal: 3 (07/03/00 4996)  Complications: No apparent anesthesia complications

## 2017-02-15 NOTE — Anesthesia Preprocedure Evaluation (Signed)
Anesthesia Evaluation  Patient identified by MRN, date of birth, ID band Patient awake    Reviewed: Allergy & Precautions, NPO status , Patient's Chart, lab work & pertinent test results  Airway Mallampati: II  TM Distance: >3 FB Neck ROM: Full    Dental   Pulmonary former smoker,    breath sounds clear to auscultation       Cardiovascular hypertension, Pt. on medications + Peripheral Vascular Disease  + dysrhythmias + Valvular Problems/Murmurs (Mild AS. Normal LVEF) AS  Rhythm:Regular Rate:Normal     Neuro/Psych negative neurological ROS     GI/Hepatic negative GI ROS, Neg liver ROS,   Endo/Other  diabetes, Type 2, Oral Hypoglycemic Agents  Renal/GU negative Renal ROS     Musculoskeletal   Abdominal   Peds  Hematology negative hematology ROS (+)   Anesthesia Other Findings   Reproductive/Obstetrics                             Lab Results  Component Value Date   WBC 4.8 02/09/2017   HGB 12.4 (L) 02/09/2017   HCT 38.3 (L) 02/09/2017   MCV 88.5 02/09/2017   PLT 215 02/09/2017   Lab Results  Component Value Date   CREATININE 1.24 02/09/2017   BUN 16 02/09/2017   NA 140 02/09/2017   K 3.9 02/09/2017   CL 106 02/09/2017   CO2 24 02/09/2017    Anesthesia Physical Anesthesia Plan  ASA: III  Anesthesia Plan: General   Post-op Pain Management:    Induction: Intravenous  Airway Management Planned: Oral ETT  Additional Equipment:   Intra-op Plan:   Post-operative Plan: Extubation in OR  Informed Consent: I have reviewed the patients History and Physical, chart, labs and discussed the procedure including the risks, benefits and alternatives for the proposed anesthesia with the patient or authorized representative who has indicated his/her understanding and acceptance.   Dental advisory given  Plan Discussed with: CRNA  Anesthesia Plan Comments:         Anesthesia  Quick Evaluation

## 2017-02-16 ENCOUNTER — Encounter (HOSPITAL_COMMUNITY): Payer: Self-pay | Admitting: Orthopedic Surgery

## 2017-02-16 NOTE — Op Note (Signed)
NAME:  Dylan [name], Dylan [name] NO.:  MEDICAL RECORD NO.:  [id number]  LOCATION:                                 FACILITY:  PHYSICIAN:  Phylliss Bob, MD           DATE OF BIRTH:  DATE OF PROCEDURE:  02/15/2017                              OPERATIVE REPORT   PREOPERATIVE DIAGNOSIS:  L4 compression fracture.  POSTOPERATIVE DIAGNOSIS:  L4 compression fracture.  PROCEDURE:  L4 kyphoplasty.  SURGEON:  Phylliss Bob, MD.  ASSISTANTPricilla Holm, PA-C.  ANESTHESIA:  General endotracheal anesthesia.  COMPLICATIONS:  None.  DISPOSITION:  Stable.  ESTIMATED BLOOD LOSS:  Minimal.  INDICATIONS FOR SURGERY:  Briefly, Dylan [name] is a very pleasant 76- year-old male, who did sustain a fall with substantial pain in the low back on October 24, 2016.  The patient was diagnosed with an L4 compression fracture.  He did not do well with nonoperative treatment measures, and 2 months after his injury, we did get an MRI of his lumbar spine, which did continue to reveal residual edema in the L4 vertebral body.  Given his ongoing pain, we did discuss proceeding with a kyphoplasty.  The patient was fully aware of the risks and limitations of surgery and did elect to proceed.  OPERATIVE DETAILS:  On February 15, 2017, the patient was brought to surgery and general endotracheal anesthesia was administered.  The patient was placed prone on a well-padded flat Jackson bed with a Wilson frame.  The patient was placed prone on a flat Jackson bed with gel rolls placed under the patient's chest and hips.  Lateral fluoroscopy was brought into the field and the back was prepped and draped in the usual fashion.  AP fluoroscopy was then draped and brought into the field as well.  A time-out procedure was performed.  I then made two small stab incisions at the upper and lateral aspects of the L4 pedicles.  Jamshidi's were advanced across the L4 pedicles in the usual manner, liberally  using AP and lateral fluoroscopy.  I then drilled through the L4 pedicles into the L4 vertebral bodies.  I then inserted kyphoplasty balloons bilaterally and I was able to inflate the balloons with approximately 3 mL of contrast on each side.  The kyphoplasty balloons were then removed and then I introduced cement bilaterally with a total of about 9 mL of cement.  There was no abnormal extravasation of cement either anteriorly, laterally, or posteriorly.  An excellent interdigitation of cement was noted.  The wounds were then irrigated.  The cement was then allowed to harden.  The wounds were then closed using 4-0 Monocryl. Bacitracin followed by sterile dressing was applied.  All instrument counts were correct at the termination of the procedure.     Phylliss Bob, MD     MD/MEDQ  D:  02/15/2017  T:  02/15/2017  Job:  [id number]

## 2017-02-28 DIAGNOSIS — M545 Low back pain: Secondary | ICD-10-CM | POA: Diagnosis not present

## 2017-02-28 DIAGNOSIS — M25521 Pain in right elbow: Secondary | ICD-10-CM | POA: Diagnosis not present

## 2017-03-02 DIAGNOSIS — M545 Low back pain: Secondary | ICD-10-CM | POA: Diagnosis not present

## 2017-03-06 DIAGNOSIS — M545 Low back pain: Secondary | ICD-10-CM | POA: Diagnosis not present

## 2017-03-08 DIAGNOSIS — H2513 Age-related nuclear cataract, bilateral: Secondary | ICD-10-CM | POA: Diagnosis not present

## 2017-03-08 DIAGNOSIS — E113313 Type 2 diabetes mellitus with moderate nonproliferative diabetic retinopathy with macular edema, bilateral: Secondary | ICD-10-CM | POA: Diagnosis not present

## 2017-03-13 DIAGNOSIS — M545 Low back pain: Secondary | ICD-10-CM | POA: Diagnosis not present

## 2017-03-15 DIAGNOSIS — M545 Low back pain: Secondary | ICD-10-CM | POA: Diagnosis not present

## 2017-03-21 DIAGNOSIS — M545 Low back pain: Secondary | ICD-10-CM | POA: Diagnosis not present

## 2017-03-24 DIAGNOSIS — M545 Low back pain: Secondary | ICD-10-CM | POA: Diagnosis not present

## 2017-03-28 DIAGNOSIS — M545 Low back pain: Secondary | ICD-10-CM | POA: Diagnosis not present

## 2017-03-30 DIAGNOSIS — M545 Low back pain: Secondary | ICD-10-CM | POA: Diagnosis not present

## 2017-04-04 DIAGNOSIS — M545 Low back pain: Secondary | ICD-10-CM | POA: Diagnosis not present

## 2017-04-06 DIAGNOSIS — M545 Low back pain: Secondary | ICD-10-CM | POA: Diagnosis not present

## 2017-04-11 DIAGNOSIS — M545 Low back pain: Secondary | ICD-10-CM | POA: Diagnosis not present

## 2017-04-11 DIAGNOSIS — M48062 Spinal stenosis, lumbar region with neurogenic claudication: Secondary | ICD-10-CM | POA: Diagnosis not present

## 2017-04-13 DIAGNOSIS — M545 Low back pain: Secondary | ICD-10-CM | POA: Diagnosis not present

## 2017-04-17 DIAGNOSIS — M545 Low back pain: Secondary | ICD-10-CM | POA: Diagnosis not present

## 2017-04-20 DIAGNOSIS — H2513 Age-related nuclear cataract, bilateral: Secondary | ICD-10-CM | POA: Diagnosis not present

## 2017-04-20 DIAGNOSIS — E113313 Type 2 diabetes mellitus with moderate nonproliferative diabetic retinopathy with macular edema, bilateral: Secondary | ICD-10-CM | POA: Diagnosis not present

## 2017-04-25 DIAGNOSIS — H2513 Age-related nuclear cataract, bilateral: Secondary | ICD-10-CM | POA: Diagnosis not present

## 2017-04-25 DIAGNOSIS — E113313 Type 2 diabetes mellitus with moderate nonproliferative diabetic retinopathy with macular edema, bilateral: Secondary | ICD-10-CM | POA: Diagnosis not present

## 2017-04-27 DIAGNOSIS — M545 Low back pain: Secondary | ICD-10-CM | POA: Diagnosis not present

## 2017-05-01 DIAGNOSIS — M545 Low back pain: Secondary | ICD-10-CM | POA: Diagnosis not present

## 2017-05-08 DIAGNOSIS — M48061 Spinal stenosis, lumbar region without neurogenic claudication: Secondary | ICD-10-CM | POA: Diagnosis not present

## 2017-05-23 DIAGNOSIS — M48061 Spinal stenosis, lumbar region without neurogenic claudication: Secondary | ICD-10-CM | POA: Diagnosis not present

## 2017-05-25 DIAGNOSIS — H25013 Cortical age-related cataract, bilateral: Secondary | ICD-10-CM | POA: Diagnosis not present

## 2017-05-25 DIAGNOSIS — H2513 Age-related nuclear cataract, bilateral: Secondary | ICD-10-CM | POA: Diagnosis not present

## 2017-05-25 DIAGNOSIS — E113313 Type 2 diabetes mellitus with moderate nonproliferative diabetic retinopathy with macular edema, bilateral: Secondary | ICD-10-CM | POA: Diagnosis not present

## 2017-05-26 DIAGNOSIS — E113313 Type 2 diabetes mellitus with moderate nonproliferative diabetic retinopathy with macular edema, bilateral: Secondary | ICD-10-CM | POA: Diagnosis not present

## 2017-05-26 DIAGNOSIS — H2513 Age-related nuclear cataract, bilateral: Secondary | ICD-10-CM | POA: Diagnosis not present

## 2017-05-29 DIAGNOSIS — M109 Gout, unspecified: Secondary | ICD-10-CM | POA: Diagnosis not present

## 2017-05-29 DIAGNOSIS — N08 Glomerular disorders in diseases classified elsewhere: Secondary | ICD-10-CM | POA: Diagnosis not present

## 2017-05-29 DIAGNOSIS — E1122 Type 2 diabetes mellitus with diabetic chronic kidney disease: Secondary | ICD-10-CM | POA: Diagnosis not present

## 2017-05-29 DIAGNOSIS — N182 Chronic kidney disease, stage 2 (mild): Secondary | ICD-10-CM | POA: Diagnosis not present

## 2017-05-29 DIAGNOSIS — I129 Hypertensive chronic kidney disease with stage 1 through stage 4 chronic kidney disease, or unspecified chronic kidney disease: Secondary | ICD-10-CM | POA: Diagnosis not present

## 2017-05-30 LAB — LIPID PANEL
Cholesterol: 131 (ref 0–200)
HDL: 60 (ref 35–70)
LDL Cholesterol: 61
TRIGLYCERIDES: 50 (ref 40–160)

## 2017-06-12 DIAGNOSIS — M48061 Spinal stenosis, lumbar region without neurogenic claudication: Secondary | ICD-10-CM | POA: Diagnosis not present

## 2017-06-14 DIAGNOSIS — R0602 Shortness of breath: Secondary | ICD-10-CM | POA: Diagnosis not present

## 2017-06-14 DIAGNOSIS — I442 Atrioventricular block, complete: Secondary | ICD-10-CM | POA: Diagnosis not present

## 2017-06-14 DIAGNOSIS — R5381 Other malaise: Secondary | ICD-10-CM | POA: Diagnosis not present

## 2017-06-14 DIAGNOSIS — R5383 Other fatigue: Secondary | ICD-10-CM | POA: Diagnosis not present

## 2017-06-27 ENCOUNTER — Ambulatory Visit (INDEPENDENT_AMBULATORY_CARE_PROVIDER_SITE_OTHER): Payer: Medicare HMO | Admitting: Cardiology

## 2017-06-27 ENCOUNTER — Encounter: Payer: Self-pay | Admitting: *Deleted

## 2017-06-27 ENCOUNTER — Other Ambulatory Visit: Payer: Self-pay | Admitting: Cardiology

## 2017-06-27 ENCOUNTER — Encounter: Payer: Self-pay | Admitting: Cardiology

## 2017-06-27 VITALS — BP 132/62 | HR 42 | Ht 71.0 in | Wt 209.0 lb

## 2017-06-27 DIAGNOSIS — E782 Mixed hyperlipidemia: Secondary | ICD-10-CM | POA: Diagnosis not present

## 2017-06-27 DIAGNOSIS — I442 Atrioventricular block, complete: Secondary | ICD-10-CM

## 2017-06-27 DIAGNOSIS — Z01812 Encounter for preprocedural laboratory examination: Secondary | ICD-10-CM | POA: Diagnosis not present

## 2017-06-27 DIAGNOSIS — I10 Essential (primary) hypertension: Secondary | ICD-10-CM | POA: Diagnosis not present

## 2017-06-27 LAB — CBC WITH DIFFERENTIAL/PLATELET
BASOS: 1 %
Basophils Absolute: 0 10*3/uL (ref 0.0–0.2)
EOS (ABSOLUTE): 0 10*3/uL (ref 0.0–0.4)
EOS: 1 %
HEMATOCRIT: 34.8 % — AB (ref 37.5–51.0)
Hemoglobin: 11 g/dL — ABNORMAL LOW (ref 13.0–17.7)
Immature Grans (Abs): 0 10*3/uL (ref 0.0–0.1)
Immature Granulocytes: 0 %
Lymphocytes Absolute: 1 10*3/uL (ref 0.7–3.1)
Lymphs: 29 %
MCH: 27.5 pg (ref 26.6–33.0)
MCHC: 31.6 g/dL (ref 31.5–35.7)
MCV: 87 fL (ref 79–97)
MONOS ABS: 0.6 10*3/uL (ref 0.1–0.9)
Monocytes: 16 %
Neutrophils Absolute: 1.9 10*3/uL (ref 1.4–7.0)
Neutrophils: 53 %
Platelets: 176 10*3/uL (ref 150–379)
RBC: 4 x10E6/uL — ABNORMAL LOW (ref 4.14–5.80)
RDW: 14.2 % (ref 12.3–15.4)
WBC: 3.5 10*3/uL (ref 3.4–10.8)

## 2017-06-27 LAB — BASIC METABOLIC PANEL
BUN / CREAT RATIO: 21 (ref 10–24)
BUN: 29 mg/dL — ABNORMAL HIGH (ref 8–27)
CO2: 22 mmol/L (ref 20–29)
CREATININE: 1.4 mg/dL — AB (ref 0.76–1.27)
Calcium: 9.6 mg/dL (ref 8.6–10.2)
Chloride: 102 mmol/L (ref 96–106)
GFR calc Af Amer: 56 mL/min/{1.73_m2} — ABNORMAL LOW (ref >59)
GFR calc non Af Amer: 49 mL/min/{1.73_m2} — ABNORMAL LOW (ref >59)
GLUCOSE: 171 mg/dL — AB (ref 65–99)
Potassium: 4.9 mmol/L (ref 3.5–5.2)
SODIUM: 140 mmol/L (ref 134–144)

## 2017-06-27 NOTE — Patient Instructions (Addendum)
Medication Instructions:    Your physician recommends that you continue on your current medications as directed. Please refer to the Current Medication list given to you today.  --- If you need a refill on your cardiac medications before your next appointment, please call your pharmacy. ---  Labwork:  Pre procedure labs today: BMET & CBCD  Testing/Procedures: Your physician has recommended that you have a defibrillator inserted. An implantable cardioverter defibrillator (ICD) is a small device that is placed in your chest or, in rare cases, your abdomen. This device uses electrical pulses or shocks to help control life-threatening, irregular heartbeats that could lead the heart to suddenly stop beating (sudden cardiac arrest). Leads are attached to the ICD that goes into your heart. This is done in the hospital and usually requires an overnight stay. Please see the instruction sheet given to you today for more information.  Follow-Up:  Your physician recommends that you schedule a wound check appointment 10-14, after your procedure on 07/11/2017, with the device clinic.   Your physician recommends that you schedule a follow up appointment in 3 months, after your procedure on 07/11/2017, with Dr. Curt Bears.  Thank you for choosing [facility]!!   Trinidad Curet, RN [phone]  Any Other Special Instructions Will Be Listed Below (If Applicable).   Pacemaker Implantation, Adult Pacemaker implantation is a procedure to place a pacemaker inside your chest. A pacemaker is a small computer that sends electrical signals to the heart and helps your heart beat normally. A pacemaker also stores information about your heart rhythms. You may need pacemaker implantation if you:  Have a slow heartbeat (bradycardia).  Faint (syncope).  Have shortness of breath (dyspnea) due to heart problems.  The pacemaker attaches to your heart through a wire, called a lead. Sometimes just one lead is  needed. Other times, there will be two leads. There are two types of pacemakers:  Transvenous pacemaker. This type is placed under the skin or muscle of your chest. The lead goes through a vein in the chest area to reach the inside of the heart.  Epicardial pacemaker. This type is placed under the skin or muscle of your chest or belly. The lead goes through your chest to the outside of the heart.  Tell a health care provider about:  Any allergies you have.  All medicines you are taking, including vitamins, herbs, eye drops, creams, and over-the-counter medicines.  Any problems you or family members have had with anesthetic medicines.  Any blood or bone disorders you have.  Any surgeries you have had.  Any medical conditions you have.  Whether you are pregnant or may be pregnant. What are the risks? Generally, this is a safe procedure. However, problems may occur, including:  Infection.  Bleeding.  Failure of the pacemaker or the lead.  Collapse of a lung or bleeding into a lung.  Blood clot inside a blood vessel with a lead.  Damage to the heart.  Infection inside the heart (endocarditis).  Allergic reactions to medicines.  What happens before the procedure? Staying hydrated Follow instructions from your health care provider about hydration, which may include:  Up to 2 hours before the procedure - you may continue to drink clear liquids, such as water, clear fruit juice, black coffee, and plain tea.  Eating and drinking restrictions Follow instructions from your health care provider about eating and drinking, which may include:  8 hours before the procedure - stop eating heavy meals or foods such as meat,  fried foods, or fatty foods.  6 hours before the procedure - stop eating light meals or foods, such as toast or cereal.  6 hours before the procedure - stop drinking milk or drinks that contain milk.  2 hours before the procedure - stop drinking clear  liquids.  Medicines  Ask your health care provider about: ? Changing or stopping your regular medicines. This is especially important if you are taking diabetes medicines or blood thinners. ? Taking medicines such as aspirin and ibuprofen. These medicines can thin your blood. Do not take these medicines before your procedure if your health care provider instructs you not to.  You may be given antibiotic medicine to help prevent infection. General instructions  You will have a heart evaluation. This may include an electrocardiogram (ECG), chest X-ray, and heart imaging (echocardiogram,  or echo) tests.  You will have blood tests.  Do not use any products that contain nicotine or tobacco, such as cigarettes and e-cigarettes. If you need help quitting, ask your health care provider.  Plan to have someone take you home from the hospital or clinic.  If you will be going home right after the procedure, plan to have someone with you for 24 hours.  Ask your health care provider how your surgical site will be marked or identified. What happens during the procedure?  To reduce your risk of infection: ? Your health care team will wash or sanitize their hands. ? Your skin will be washed with soap. ? Hair may be removed from the surgical area.  An IV tube will be inserted into one of your veins.  You will be given one or more of the following: ? A medicine to help you relax (sedative). ? A medicine to numb the area (local anesthetic). ? A medicine to make you fall asleep (general anesthetic).  If you are getting a transvenous pacemaker: ? An incision will be made in your upper chest. ? A pocket will be made for the pacemaker. It may be placed under the skin or between layers of muscle. ? The lead will be inserted into a blood vessel that returns to the heart. ? While X-rays are taken by an imaging machine (fluoroscopy), the lead will be advanced through the vein to the inside of your  heart. ? The other end of the lead will be tunneled under the skin and attached to the pacemaker.  If you are getting an epicardial pacemaker: ? An incision will be made near your ribs or breastbone (sternum) for the lead. ? The lead will be attached to the outside of your heart. ? Another incision will be made in your chest or upper belly to create a pocket for the pacemaker. ? The free end of the lead will be tunneled under the skin and attached to the pacemaker.  The transvenous or epicardial pacemaker will be tested. Imaging studies may be done to check the lead position.  The incisions will be closed with stitches (sutures), adhesive strips, or skin glue.  Bandages (dressing) will be placed over the incisions. The procedure may vary among health care providers and hospitals. What happens after the procedure?  Your blood pressure, heart rate, breathing rate, and blood oxygen level will be monitored until the medicines you were given have worn off.  You will be given antibiotics and pain medicine.  ECG and chest x-rays will be done.  You will wear a continuous type of ECG (Holter monitor) to check your heart rhythm.  Your health care provider willprogram the pacemaker.  Do not drive for 24 hours if you received a sedative. This information is not intended to replace advice given to you by your health care provider. Make sure you discuss any questions you have with your health care provider. Document Released: 10/07/2002 Document Revised: 05/06/2016 Document Reviewed: 03/30/2016 Elsevier Interactive Patient Education  [employer].

## 2017-06-27 NOTE — Progress Notes (Signed)
Electrophysiology Office Note   Date:  06/27/2017   ID:  Dylan Ina., DOB [birth year], MRN [id number]  PCP:  [name], MD  Cardiologist:  [name] Primary Electrophysiologist:  Lakrista Scaduto Meredith Leeds, MD    Chief Complaint  Patient presents with  . Advice Only    Right bundle branch block/Discuss Device     History of Present Illness: Dylan Overfield. is a 76 y.o. male who is being seen today for the evaluation of complete AV block at the request of [name], MD. Presenting today for electrophysiology evaluation. He has a history of hypertension, hyperlipidemia, and diabetes with PAD and diabetic retinopathy. He's been having dyspnea and fatigue. Dyspnea has worsened over the last 23 months with marked fatigue and malaise. Around Christmas she accidentally fell and had vertebral compression fractures. No dizziness or syncope. No palpitations.Her EKG does primary cardiologist's office that showed complete AV block. He has been having symptoms of shortness of breath, fatigue, and weakness. His symptoms mainly occur when he is exerting himself. He does have some fatigue symptoms when he is at rest.  Today, he denies symptoms of palpitations, chest pain, shortness of breath, orthopnea, PND, lower extremity edema, claudication, dizziness, presyncope, syncope, bleeding, or neurologic sequela. The patient is tolerating medications without difficulties.    Past Medical History:  Diagnosis Date  . Aortic stenosis    mild AS 11/24/16 (peak grad 24, mean grad 11) Dr. [name]  . Diabetes mellitus without complication ([place])   . Gout   . Hypertension   . Peripheral arterial disease ([place])   . Second degree AV block    Wenckebach; no indication for pacemaker as of 12/01/16 (Dr. [name])   Past Surgical History:  Procedure Laterality Date  . CARDIOVASCULAR STRESS TEST     11/21/16 Low risk study, EF 52% [facility])  . EYE SURGERY    . KYPHOPLASTY N/A 02/15/2017   Procedure: LUMBAR FOUR KYPHOPLASTY;  Surgeon: Phylliss Bob, MD;  Location: [place];  Service: Orthopedics;  Laterality: N/A;  . lipoma surgery     neck - 30 years ago  . TRANSTHORACIC ECHOCARDIOGRAM     11/24/16 [facility]): EF 55-60%, mild AS, mild-mod MR, mod TR, moderate pulm HTN, PAP 49 mmHg     Current Outpatient Prescriptions  Medication Sig Dispense Refill  . allopurinol (ZYLOPRIM) 100 MG tablet Take 100 mg by mouth daily.    [name] amLODipine (NORVASC) 10 MG tablet Take 10 mg by mouth daily.    . Ascorbic Acid (VITAMIN C) 1000 MG tablet Take 1,000 mg by mouth daily.    [name] aspirin 81 MG tablet Take 81 mg by mouth daily.    . Cholecalciferol 5000 UNITS capsule Take 5,000 Units by mouth every evening.     . metFORMIN (GLUCOPHAGE) 1000 MG tablet Take 1,000 mg by mouth 2 (two) times daily with a meal.    . oxyCODONE-acetaminophen (PERCOCET/ROXICET) 5-325 MG tablet Take 1 tablet by mouth every 4 (four) hours as needed for severe pain. 20 tablet 0  . pioglitazone (ACTOS) 45 MG tablet Take 45 mg by mouth every evening.     . pravastatin (PRAVACHOL) 20 MG tablet Take 20 mg by mouth every evening.    . sitaGLIPtin (JANUVIA) 100 MG tablet Take 100 mg by mouth daily.    . valsartan (DIOVAN) 320 MG tablet Take 320 mg by mouth daily.     No current facility-administered medications for this visit.     Allergies:   No  known allergies   Social History:  The patient  reports that he has quit smoking. He has never used smokeless tobacco. He reports that he does not drink alcohol or use drugs.   Family History:  The patient's family history includes Breast cancer in his mother; Diabetes in his brother, brother, brother, brother, maternal grandmother, mother, sister, sister, sister, and sister; Hypertension in his father.    ROS:  Please see the history of present illness.   Otherwise, review of systems is positive for fatigue, weakness, SOB.   All other systems are reviewed and negative.     PHYSICAL EXAM: VS:  BP 132/62   Pulse (!) 42   Ht 5\' 11"  (1.803 m)   Wt 209 lb (94.8 kg)   BMI 29.15 kg/m  , BMI Body mass index is 29.15 kg/m. GEN: Well nourished, well developed, in no acute distress  HEENT: normal  Neck: no JVD, carotid bruits, or masses Cardiac: bradycardic, regular no murmurs, rubs, or gallops,no edema  Respiratory:  clear to auscultation bilaterally, normal work of breathing GI: soft, nontender, nondistended, + BS MS: no deformity or atrophy  Skin: warm and dry Neuro:  Strength and sensation are intact Psych: euthymic mood, full affect  EKG:  EKG is ordered today. Personal review of the ekg ordered shows SR, complete AV block, junctional escape  Recent Labs: 02/09/2017: ALT 21; BUN 16; Creatinine, Ser 1.24; Hemoglobin 12.4; Platelets 215; Potassium 3.9; Sodium 140    Lipid Panel  No results found for: CHOL, TRIG, HDL, CHOLHDL, VLDL, LDLCALC, LDLDIRECT   Wt Readings from Last 3 Encounters:  06/27/17 209 lb (94.8 kg)  02/15/17 208 lb (94.3 kg)  02/09/17 208 lb 3.2 oz (94.4 kg)      Other studies Reviewed: Additional studies/ records that were reviewed today include: TTE 11/24/16 Review of the above records today demonstrates:  Ejection fraction 55-60%, moderate LVH Left atrium mild to moderately dilated right atrium moderately dilated right ventricle moderately dilated normal right ventricular function Mild to moderate mitral regurgitation Moderate tricuspid regurgitation PA systolic pressure 49 mmHg   Myoview 11/21/16 Ejection fraction 50%, normal perfusion     ASSESSMENT AND PLAN:  1.  Complete heart block: Found on recent EKG. Patient with fatigue, and shortness of breath. Plan for dual-chamber pacemaker. Risks and benefits were discussed. Risks include bleeding, infection, tamponade, pneumothorax. He understands these risks and has agreed to the procedure.  2. Hypertension: Well-controlled today. No changes at this time.  3.  Hyperlipidemia: Continue pravastatin  Current medicines are reviewed at length with the patient today.   The patient does not have concerns regarding his medicines.  The following changes were made today:  none  Labs/ tests ordered today include:  Orders Placed This Encounter  Procedures  . Basic Metabolic Panel (BMET)  . CBC w/Diff  . EKG 12-Lead     Disposition:   FU with Renay Crammer 3 months  Signed, Graison Leinberger Meredith Leeds, MD  06/27/2017 9:53 AM     [facility] [address] [place] [address] [phone] (office) [phone] (fax)

## 2017-06-30 ENCOUNTER — Encounter: Payer: Self-pay | Admitting: Cardiology

## 2017-06-30 NOTE — Telephone Encounter (Signed)
This encounter was created in error - please disregard.

## 2017-06-30 NOTE — Telephone Encounter (Signed)
New message ° ° ° °Pt is returning call about labs.  °

## 2017-07-01 DIAGNOSIS — I442 Atrioventricular block, complete: Secondary | ICD-10-CM

## 2017-07-01 HISTORY — DX: Atrioventricular block, complete: I44.2

## 2017-07-05 DIAGNOSIS — H2513 Age-related nuclear cataract, bilateral: Secondary | ICD-10-CM | POA: Diagnosis not present

## 2017-07-05 DIAGNOSIS — H25013 Cortical age-related cataract, bilateral: Secondary | ICD-10-CM | POA: Diagnosis not present

## 2017-07-05 DIAGNOSIS — H4311 Vitreous hemorrhage, right eye: Secondary | ICD-10-CM | POA: Diagnosis not present

## 2017-07-05 DIAGNOSIS — E113313 Type 2 diabetes mellitus with moderate nonproliferative diabetic retinopathy with macular edema, bilateral: Secondary | ICD-10-CM | POA: Diagnosis not present

## 2017-07-06 DIAGNOSIS — E113313 Type 2 diabetes mellitus with moderate nonproliferative diabetic retinopathy with macular edema, bilateral: Secondary | ICD-10-CM | POA: Diagnosis not present

## 2017-07-06 DIAGNOSIS — H4311 Vitreous hemorrhage, right eye: Secondary | ICD-10-CM | POA: Diagnosis not present

## 2017-07-11 ENCOUNTER — Encounter (HOSPITAL_COMMUNITY): Payer: Self-pay | Admitting: General Practice

## 2017-07-11 ENCOUNTER — Encounter (HOSPITAL_COMMUNITY)
Admission: RE | Disposition: A | Discharge status: Home / Self Care | Payer: Self-pay | Source: Ambulatory Visit | Attending: Cardiology

## 2017-07-11 ENCOUNTER — Ambulatory Visit (HOSPITAL_COMMUNITY)
Admission: RE | Admit: 2017-07-11 | Discharge: 2017-07-12 | Disposition: A | Discharge status: Home / Self Care | Payer: Medicare HMO | Source: Ambulatory Visit | Attending: Cardiology | Admitting: Cardiology

## 2017-07-11 DIAGNOSIS — Z950 Presence of cardiac pacemaker: Secondary | ICD-10-CM

## 2017-07-11 DIAGNOSIS — I739 Peripheral vascular disease, unspecified: Secondary | ICD-10-CM | POA: Diagnosis not present

## 2017-07-11 DIAGNOSIS — Z7984 Long term (current) use of oral hypoglycemic drugs: Secondary | ICD-10-CM | POA: Diagnosis not present

## 2017-07-11 DIAGNOSIS — Z7982 Long term (current) use of aspirin: Secondary | ICD-10-CM | POA: Insufficient documentation

## 2017-07-11 DIAGNOSIS — I442 Atrioventricular block, complete: Secondary | ICD-10-CM | POA: Diagnosis not present

## 2017-07-11 DIAGNOSIS — R001 Bradycardia, unspecified: Secondary | ICD-10-CM | POA: Diagnosis not present

## 2017-07-11 DIAGNOSIS — E785 Hyperlipidemia, unspecified: Secondary | ICD-10-CM | POA: Insufficient documentation

## 2017-07-11 DIAGNOSIS — Z95818 Presence of other cardiac implants and grafts: Secondary | ICD-10-CM

## 2017-07-11 DIAGNOSIS — I10 Essential (primary) hypertension: Secondary | ICD-10-CM | POA: Insufficient documentation

## 2017-07-11 DIAGNOSIS — E1151 Type 2 diabetes mellitus with diabetic peripheral angiopathy without gangrene: Secondary | ICD-10-CM | POA: Insufficient documentation

## 2017-07-11 HISTORY — DX: Presence of cardiac pacemaker: Z95.0

## 2017-07-11 HISTORY — DX: Atrioventricular block, complete: I44.2

## 2017-07-11 HISTORY — PX: PACEMAKER IMPLANT: EP1218

## 2017-07-11 LAB — SURGICAL PCR SCREEN
MRSA, PCR: NEGATIVE
Staphylococcus aureus: NEGATIVE

## 2017-07-11 LAB — GLUCOSE, CAPILLARY
GLUCOSE-CAPILLARY: 145 mg/dL — AB (ref 65–99)
GLUCOSE-CAPILLARY: 108 mg/dL — AB (ref 65–99)
Glucose-Capillary: 124 mg/dL — ABNORMAL HIGH (ref 65–99)
Glucose-Capillary: 117 mg/dL — ABNORMAL HIGH (ref 65–99)

## 2017-07-11 SURGERY — PACEMAKER IMPLANT

## 2017-07-11 MED ORDER — IRBESARTAN 300 MG PO TABS
300.0000 mg | ORAL_TABLET | Freq: Every day | ORAL | Status: DC
  Administered 2017-07-11 – 2017-07-12 (×2): 300 mg via ORAL
  Filled 2017-07-11 (×2): qty 1

## 2017-07-11 MED ORDER — ONDANSETRON HCL 4 MG/2ML IJ SOLN: 4.0000 mg | Freq: Four times a day (QID) | INTRAMUSCULAR | Status: DC | PRN

## 2017-07-11 MED ORDER — HEPARIN (PORCINE) IN NACL 2-0.9 UNIT/ML-% IJ SOLN
INTRAMUSCULAR | Status: AC
  Filled 2017-07-11: qty 500

## 2017-07-11 MED ORDER — CEFAZOLIN SODIUM-DEXTROSE 1-4 GM/50ML-% IV SOLN
1.0000 g | Freq: Four times a day (QID) | INTRAVENOUS | Status: AC
  Administered 2017-07-11 – 2017-07-12 (×2): 1 g via INTRAVENOUS
  Filled 2017-07-11 (×3): qty 50

## 2017-07-11 MED ORDER — MUPIROCIN 2 % EX OINT
TOPICAL_OINTMENT | CUTANEOUS | Status: AC
  Filled 2017-07-11: qty 22

## 2017-07-11 MED ORDER — AMLODIPINE BESYLATE 10 MG PO TABS
10.0000 mg | ORAL_TABLET | Freq: Every day | ORAL | Status: DC
  Administered 2017-07-11 – 2017-07-12 (×2): 10 mg via ORAL
  Filled 2017-07-11 (×3): qty 1

## 2017-07-11 MED ORDER — ACETAMINOPHEN 325 MG PO TABS: 325.0000 mg | ORAL_TABLET | ORAL | Status: DC | PRN

## 2017-07-11 MED ORDER — GENTAMICIN SULFATE 40 MG/ML IJ SOLN
INTRAMUSCULAR | Status: DC | PRN
  Administered 2017-07-11: 08:00:00

## 2017-07-11 MED ORDER — ALLOPURINOL 100 MG PO TABS
100.0000 mg | ORAL_TABLET | Freq: Every day | ORAL | Status: DC
  Administered 2017-07-11 – 2017-07-12 (×2): 100 mg via ORAL
  Filled 2017-07-11 (×2): qty 1

## 2017-07-11 MED ORDER — LIDOCAINE HCL (PF) 1 % IJ SOLN
INTRAMUSCULAR | Status: DC | PRN
  Administered 2017-07-11: 30 mL

## 2017-07-11 MED ORDER — PRAVASTATIN SODIUM 20 MG PO TABS
20.0000 mg | ORAL_TABLET | Freq: Every evening | ORAL | Status: DC
  Administered 2017-07-11: 20 mg via ORAL
  Filled 2017-07-11: qty 1

## 2017-07-11 MED ORDER — SODIUM CHLORIDE 0.9 % IR SOLN
80.0000 mg | Status: DC
  Filled 2017-07-11: qty 2

## 2017-07-11 MED ORDER — ASPIRIN EC 81 MG PO TBEC
81.0000 mg | DELAYED_RELEASE_TABLET | Freq: Every day | ORAL | Status: DC
  Administered 2017-07-11 – 2017-07-12 (×2): 81 mg via ORAL
  Filled 2017-07-11 (×2): qty 1

## 2017-07-11 MED ORDER — LIDOCAINE HCL (PF) 1 % IJ SOLN
INTRAMUSCULAR | Status: AC
  Filled 2017-07-11: qty 60

## 2017-07-11 MED ORDER — MUPIROCIN 2 % EX OINT
TOPICAL_OINTMENT | Freq: Once | CUTANEOUS | Status: AC
  Administered 2017-07-11: 1 via NASAL
  Filled 2017-07-11: qty 22

## 2017-07-11 MED ORDER — CEFAZOLIN SODIUM-DEXTROSE 2-3 GM-% IV SOLR
INTRAVENOUS | Status: DC | PRN
  Administered 2017-07-11: 2 g via INTRAVENOUS

## 2017-07-11 MED ORDER — INSULIN ASPART 100 UNIT/ML ~~LOC~~ SOLN
0.0000 [IU] | SUBCUTANEOUS | Status: DC
  Administered 2017-07-12: 2 [IU] via SUBCUTANEOUS

## 2017-07-11 MED ORDER — SODIUM CHLORIDE 0.9 % IR SOLN
Status: AC
  Filled 2017-07-11: qty 2

## 2017-07-11 MED ORDER — METFORMIN HCL 500 MG PO TABS
1000.0000 mg | ORAL_TABLET | Freq: Two times a day (BID) | ORAL | Status: DC
Start: 2017-07-11 — End: 2017-07-12
  Administered 2017-07-11 – 2017-07-12 (×2): 1000 mg via ORAL
  Filled 2017-07-11 (×3): qty 2

## 2017-07-11 MED ORDER — SODIUM CHLORIDE 0.9 % IV SOLN
INTRAVENOUS | Status: DC
  Administered 2017-07-11: 06:00:00 via INTRAVENOUS

## 2017-07-11 MED ORDER — LINAGLIPTIN 5 MG PO TABS
5.0000 mg | ORAL_TABLET | Freq: Every day | ORAL | Status: DC
  Administered 2017-07-11 – 2017-07-12 (×2): 5 mg via ORAL
  Filled 2017-07-11 (×2): qty 1

## 2017-07-11 MED ORDER — VITAMIN C 500 MG PO TABS
1000.0000 mg | ORAL_TABLET | Freq: Every day | ORAL | Status: DC
  Administered 2017-07-11 – 2017-07-12 (×2): 1000 mg via ORAL
  Filled 2017-07-11 (×2): qty 2

## 2017-07-11 MED ORDER — VITAMIN D3 25 MCG (1000 UNIT) PO TABS
5000.0000 [IU] | ORAL_TABLET | Freq: Every evening | ORAL | Status: DC
  Administered 2017-07-11: 5000 [IU] via ORAL
  Filled 2017-07-11 (×3): qty 5

## 2017-07-11 MED ORDER — CEFAZOLIN SODIUM-DEXTROSE 2-4 GM/100ML-% IV SOLN: 2.0000 g | INTRAVENOUS | Status: DC

## 2017-07-11 MED ORDER — HYPROMELLOSE (GONIOSCOPIC) 2.5 % OP SOLN
1.0000 [drp] | Freq: Every day | OPHTHALMIC | Status: DC | PRN
  Filled 2017-07-11: qty 15

## 2017-07-11 MED ORDER — CEFAZOLIN SODIUM-DEXTROSE 2-4 GM/100ML-% IV SOLN
INTRAVENOUS | Status: AC
  Filled 2017-07-11: qty 100

## 2017-07-11 MED ORDER — PIOGLITAZONE HCL 45 MG PO TABS
45.0000 mg | ORAL_TABLET | Freq: Every evening | ORAL | Status: DC
  Administered 2017-07-11: 45 mg via ORAL
  Filled 2017-07-11 (×2): qty 1

## 2017-07-11 MED ORDER — HEPARIN (PORCINE) IN NACL 2-0.9 UNIT/ML-% IJ SOLN
INTRAMUSCULAR | Status: AC | PRN
  Administered 2017-07-11: 500 mL

## 2017-07-11 SURGICAL SUPPLY — 10 items
CABLE SURGICAL S-101-97-12 (CABLE) ×2 IMPLANT
LEAD TENDRIL MRI 52CM LPA1200M (Lead) ×2 IMPLANT
LEAD TENDRIL MRI 58CM LPA1200M (Lead) ×2 IMPLANT
PACEMAKER ASSURITY DR-RF (Pacemaker) ×2 IMPLANT
PAD DEFIB LIFELINK (PAD) ×2 IMPLANT
SHEATH CLASSIC 7F (SHEATH) IMPLANT
SHEATH CLASSIC 8F (SHEATH) ×4 IMPLANT
SHEATH CLASSIC 9.5F (SHEATH) IMPLANT
SHEATH CLASSIC 9F (SHEATH) IMPLANT
TRAY PACEMAKER INSERTION (PACKS) ×2 IMPLANT

## 2017-07-11 NOTE — H&P (Signed)
Dylan [name]. is a 76 y.o. male with a history of HTN, HLD, PAD who presented to clinic with complete AV block. He has been having weakness and fatigue. On exam, bradycardic, no murmurs, lungs clear. Presenting today for pacemaker implant. Risks and benefits discussed. Risks include but not limited to bleeding, infection, tamponade, pneumothorax. The patient understands the risks and has agreed to the procedure.  [name], MD 07/11/2017 7:10 AM

## 2017-07-11 NOTE — Discharge Instructions (Signed)
° ° °  Supplemental Discharge Instructions for  Pacemaker/Defibrillator Patients  Activity No heavy lifting or vigorous activity with your left/right arm for 6 to 8 weeks.  Do not raise your left/right arm above your head for one week.  Gradually raise your affected arm as drawn below.             07/15/17                     07/16/17                    07/17/17                  07/18/17 __  NO DRIVING for  1 week   ; you may begin driving on   05/09/61  .  WOUND CARE - Keep the wound area clean and dry.  Do not get this area wet, no showers until seen at your wound check visit . - The tape/steri-strips on your wound will fall off; do not pull them off.  No bandage is needed on the site.  DO  NOT apply any creams, oils, or ointments to the wound area. - If you notice any drainage or discharge from the wound, any swelling or bruising at the site, or you develop a fever > 101? F after you are discharged home, call the office at once.  Special Instructions - You are still able to use cellular telephones; use the ear opposite the side where you have your pacemaker/defibrillator.  Avoid carrying your cellular phone near your device. - When traveling through airports, show security personnel your identification card to avoid being screened in the metal detectors.  Ask the security personnel to use the hand wand. - Avoid arc welding equipment, MRI testing (magnetic resonance imaging), TENS units (transcutaneous nerve stimulators).  Call the office for questions about other devices. - Avoid electrical appliances that are in poor condition or are not properly grounded. - Microwave ovens are safe to be near or to operate.  Additional information for defibrillator patients should your device go off: - If your device goes off ONCE and you feel fine afterward, notify the device clinic nurses. - If your device goes off ONCE and you do not feel well afterward, call 911. - If your device goes off TWICE, call  911. - If your device goes off THREE times in one day, call 911.  DO NOT DRIVE YOURSELF OR A FAMILY MEMBER WITH A DEFIBRILLATOR TO THE HOSPITAL--CALL 911.

## 2017-07-11 NOTE — Discharge Summary (Signed)
ELECTROPHYSIOLOGY PROCEDURE DISCHARGE SUMMARY    Patient ID: Dylan [name].,  MRN: [id number], DOB/AGE: Mar 08, 1941 76 y.o.  Admit date: 07/11/2017 Discharge date: 07/12/17  Primary Care Physician: [name], MD Primary Cardiologist: Dr. [name] Electrophysiologist: Dr. Curt Bears  Primary Discharge Diagnosis:  1. CHB 2. Symptomatic bradycardia  Secondary Discharge Diagnosis:  1. DM 2. HTN 3. PVD 4. HLD  Allergies  Allergen Reactions  . No Known Allergies      Procedures This Admission:  1.  Implantation of a SJM dual chamber PPM on 07/11/17 by Dr Curt Bears.  The patient received a [employer]roller [employer]urity MRI  model [id number] (serial number  [id number] ) pacemaker,  Screven [id number] (serial number  [id number]) right atrial lead and a St Jude Medical model [id number] (serial number [id number]) right ventricular lead  There were no immediate post procedure complications. 2.  CXR on demonstrated no pneumothorax status post device implantation.   Brief HPI: Dylan [name]. is a 76 y.o. male was referred to electrophysiology in the outpatient setting for consideration of PPM implantation.  Past medical history includes HTN, HLD, DM.  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  Risks, benefits, and alternatives to PPM implantation were reviewed with the patient who wished to proceed.   Hospital Course:  The patient was admitted and underwent implantation of a PPM with details as outlined above.  She was monitored on telemetry overnight which demonstrated SR, V pacing.  Left chest was without hematoma or ecchymosis.  The device was interrogated and found to be functioning normally.  CXR was obtained and demonstrated no pneumothorax status post device implantation.  Wound care, arm mobility, and restrictions were reviewed with the patient.  Pt has been hypertensive here, Zafira Munos start low dose BB.  The patient was  examined by Dr. Curt Bears and considered stable for discharge to home.    Physical Exam: Vitals:   07/11/17 1756 07/11/17 2105 07/12/17 0010 07/12/17 0518  BP: (!) 198/83 (!) 179/63 (!) 187/68 (!) 177/69  Pulse: 68 64 71 70  Resp: 15 18  19   Temp: 98.1 F (36.7 C) 98.7 F (37.1 C) 98.1 F (36.7 C) 98.1 F (36.7 C)  TempSrc: Oral Oral Oral Oral  SpO2: 100% 96% 98% 94%  Weight:    200 lb (90.7 kg)  Height:        GEN- The patient is well appearing, alert and oriented x 3 today.   HEENT: normocephalic, atraumatic; sclera clear, conjunctiva pink; hearing intact; oropharynx clear; neck supple, no JVP Lungs- CTA b/l, normal work of breathing.  No wheezes, rales, rhonchi Heart-  RRR, no murmurs, rubs or gallops, PMI not laterally displaced GI- soft, non-tender, non-distended Extremities- no clubbing, cyanosis, or edema MS- no significant deformity or atrophy Skin- warm and dry, no rash or lesion, left chest without hematoma/ecchymosis Psych- euthymic mood, full affect Neuro- no gross deficits   Labs:   Lab Results  Component Value Date   WBC 3.5 06/27/2017   HGB 11.0 (L) 06/27/2017   HCT 34.8 (L) 06/27/2017   MCV 87 06/27/2017   PLT 176 06/27/2017   No results for input(s): NA, K, CL, CO2, BUN, CREATININE, CALCIUM, PROT, BILITOT, ALKPHOS, ALT, AST, GLUCOSE in the last 168 hours.  Invalid input(s): LABALBU  Discharge Medications:  Allergies as of 07/12/2017      Reactions   No Known Allergies       Medication List  STOP taking these medications   oxyCODONE-acetaminophen 5-325 MG tablet Commonly known as:  PERCOCET/ROXICET     TAKE these medications   allopurinol 100 MG tablet Commonly known as:  ZYLOPRIM Take 100 mg by mouth daily.   amLODipine 10 MG tablet Commonly known as:  NORVASC Take 10 mg by mouth daily.   aspirin 81 MG tablet Take 81 mg by mouth daily.   carvedilol 3.125 MG tablet Commonly known as:  COREG Take 1 tablet (3.125 mg total) by mouth  2 (two) times daily with a meal.   Cholecalciferol 5000 units capsule Take 5,000 Units by mouth every evening.   hydroxypropyl methylcellulose / hypromellose 2.5 % ophthalmic solution Commonly known as:  ISOPTO TEARS / GONIOVISC Place 1 drop into both eyes daily as needed for dry eyes.   metFORMIN 1000 MG tablet Commonly known as:  GLUCOPHAGE Take 1,000 mg by mouth 2 (two) times daily with a meal.   pioglitazone 45 MG tablet Commonly known as:  ACTOS Take 45 mg by mouth every evening.   pravastatin 20 MG tablet Commonly known as:  PRAVACHOL Take 20 mg by mouth every evening.   sitaGLIPtin 100 MG tablet Commonly known as:  JANUVIA Take 100 mg by mouth daily.   valsartan 320 MG tablet Commonly known as:  DIOVAN Take 320 mg by mouth daily.   vitamin C 1000 MG tablet Take 1,000 mg by mouth daily.            Discharge Care Instructions        Start     Ordered   07/12/17 0000  carvedilol (COREG) 3.125 MG tablet  2 times daily with meals    Question:  Supervising Provider  Answer:  Thompson Grayer   07/12/17 0806   07/12/17 0000  Increase activity slowly     07/12/17 0806   07/12/17 0000  Diet - low sodium heart healthy     07/12/17 0806      Disposition:  Home Discharge Instructions    Diet - low sodium heart healthy    Complete by:  As directed    Increase activity slowly    Complete by:  As directed      Follow-up Information    Constance Haw, MD Follow up on 10/10/2017.   Specialty:  Cardiology Why:  10:30AM Contact information: [address] [place] [address] [phone]        [place] [facility] Follow up on 07/24/2017.   Specialty:  Cardiology Why:  10:30AM, wound check visit Contact information: [address], [place] [phone]          Duration of Discharge Encounter: Greater than 30 minutes including physician time.  SignedTommye Standard,  PA-C 07/12/2017 8:06 AM  I have seen and examined this patient with [name].  Agree with above, note added to reflect my findings.  On exam, RRR, no murmurs, lungs clear. Had St. Jude dual-chamber pacemaker implanted for complete heart block. Tolerated the procedure without issue. Chest x-ray and interrogation with without major abnormality. Plan for discharge today with follow-up in device clinic.    Jorgen Wolfinger M. Debbera Wolken MD 07/12/2017 8:17 AM

## 2017-07-11 NOTE — Progress Notes (Signed)
Urine output 500cc's

## 2017-07-12 ENCOUNTER — Encounter (HOSPITAL_COMMUNITY): Payer: Self-pay | Admitting: Cardiology

## 2017-07-12 ENCOUNTER — Ambulatory Visit (HOSPITAL_COMMUNITY): Payer: Medicare HMO

## 2017-07-12 DIAGNOSIS — E785 Hyperlipidemia, unspecified: Secondary | ICD-10-CM | POA: Diagnosis not present

## 2017-07-12 DIAGNOSIS — Z7984 Long term (current) use of oral hypoglycemic drugs: Secondary | ICD-10-CM | POA: Diagnosis not present

## 2017-07-12 DIAGNOSIS — E1151 Type 2 diabetes mellitus with diabetic peripheral angiopathy without gangrene: Secondary | ICD-10-CM | POA: Diagnosis not present

## 2017-07-12 DIAGNOSIS — I442 Atrioventricular block, complete: Secondary | ICD-10-CM | POA: Diagnosis not present

## 2017-07-12 DIAGNOSIS — I10 Essential (primary) hypertension: Secondary | ICD-10-CM | POA: Diagnosis not present

## 2017-07-12 DIAGNOSIS — I739 Peripheral vascular disease, unspecified: Secondary | ICD-10-CM | POA: Diagnosis not present

## 2017-07-12 DIAGNOSIS — R001 Bradycardia, unspecified: Secondary | ICD-10-CM | POA: Diagnosis not present

## 2017-07-12 DIAGNOSIS — Z7982 Long term (current) use of aspirin: Secondary | ICD-10-CM | POA: Diagnosis not present

## 2017-07-12 DIAGNOSIS — I700 Atherosclerosis of aorta: Secondary | ICD-10-CM | POA: Diagnosis not present

## 2017-07-12 LAB — GLUCOSE, CAPILLARY
GLUCOSE-CAPILLARY: 92 mg/dL (ref 65–99)
Glucose-Capillary: 106 mg/dL — ABNORMAL HIGH (ref 65–99)
Glucose-Capillary: 128 mg/dL — ABNORMAL HIGH (ref 65–99)

## 2017-07-12 MED ORDER — CARVEDILOL 3.125 MG PO TABS
3.1250 mg | ORAL_TABLET | Freq: Two times a day (BID) | ORAL | Status: DC
  Administered 2017-07-12: 3.125 mg via ORAL
  Filled 2017-07-12: qty 1

## 2017-07-12 MED ORDER — CEFAZOLIN SODIUM-DEXTROSE 1-4 GM/50ML-% IV SOLN
1.0000 g | Freq: Once | INTRAVENOUS | Status: AC
  Administered 2017-07-12: 1 g via INTRAVENOUS
  Filled 2017-07-12: qty 50

## 2017-07-12 MED ORDER — CARVEDILOL 3.125 MG PO TABS: 3.1250 mg | ORAL_TABLET | Freq: Two times a day (BID) | ORAL | 6 refills | Status: DC

## 2017-07-12 NOTE — Progress Notes (Signed)
Patient had minimal bleeding around incision site, previous RN marked bleeding. No complaints from patient throughout the night.

## 2017-07-12 NOTE — Progress Notes (Signed)
Pt has orders to be discharged. Discharge instructions given and pt has no additional questions at this time. Medication regimen reviewed and pt educated. Pt verbalized understanding and has no additional questions. Telemetry box removed. IV removed and site in good condition. Pt stable and waiting for transportation.  Talin Feister RN 

## 2017-07-13 ENCOUNTER — Telehealth: Payer: Self-pay | Admitting: Cardiology

## 2017-07-13 NOTE — Telephone Encounter (Signed)
Spoke with Dylan [name] and explained that she will receive a permanent ID card in the mail from the manufacture within a couple of weeks She verbalized understanding.

## 2017-07-13 NOTE — Telephone Encounter (Signed)
Mrs.Hickel is calling about the patient identification card for the pacer maker . Please call

## 2017-07-24 ENCOUNTER — Ambulatory Visit (INDEPENDENT_AMBULATORY_CARE_PROVIDER_SITE_OTHER): Payer: Medicare HMO | Admitting: *Deleted

## 2017-07-24 DIAGNOSIS — I442 Atrioventricular block, complete: Secondary | ICD-10-CM

## 2017-07-24 LAB — CUP PACEART INCLINIC DEVICE CHECK
Battery Remaining Longevity: 76 mo
Battery Voltage: 3.04 V
Brady Statistic RV Percent Paced: 99.27 %
Implantable Lead Implant Date: 20180911
Implantable Lead Implant Date: 20180911
Lead Channel Impedance Value: 537.5 Ohm
Lead Channel Pacing Threshold Amplitude: 0.75 V
Lead Channel Pacing Threshold Pulse Width: 0.5 ms
Lead Channel Pacing Threshold Pulse Width: 0.5 ms
Lead Channel Sensing Intrinsic Amplitude: 5.9 mV
Lead Channel Setting Pacing Amplitude: 3.5 V
Lead Channel Setting Pacing Amplitude: 3.5 V
Lead Channel Setting Sensing Sensitivity: 4 mV
MDC IDC LEAD LOCATION: 753859
MDC IDC LEAD LOCATION: 753860
MDC IDC MSMT LEADCHNL RA IMPEDANCE VALUE: 575 Ohm
MDC IDC MSMT LEADCHNL RA PACING THRESHOLD AMPLITUDE: 0.75 V
MDC IDC MSMT LEADCHNL RA PACING THRESHOLD PULSEWIDTH: 0.5 ms
MDC IDC MSMT LEADCHNL RA SENSING INTR AMPL: 3.2 mV
MDC IDC MSMT LEADCHNL RV PACING THRESHOLD AMPLITUDE: 0.75 V
MDC IDC MSMT LEADCHNL RV PACING THRESHOLD AMPLITUDE: 0.75 V
MDC IDC MSMT LEADCHNL RV PACING THRESHOLD PULSEWIDTH: 0.5 ms
MDC IDC PG IMPLANT DT: 20180911
MDC IDC SESS DTM: 20180924112956
MDC IDC SET LEADCHNL RV PACING PULSEWIDTH: 0.5 ms
MDC IDC STAT BRADY RA PERCENT PACED: 5.5 %
Pulse Gen Model: 2272
Pulse Gen Serial Number: 8940191

## 2017-07-24 NOTE — Progress Notes (Signed)
Wound check appointment. Steri-strips removed. Wound without redness or edema. Incision edges approximated, wound well healed. Normal device function. Thresholds, sensing, and impedances consistent with implant measurements. Device programmed at 3.5V for extra safety margin until 3 month visit. Histogram distribution appropriate for patient and level of activity. 4 mode switches - EGMs show Atach. No high ventricular rates noted. Patient educated about wound care, arm mobility, lifting restrictions. ROV 12/11 with WC

## 2017-08-03 DIAGNOSIS — Z1211 Encounter for screening for malignant neoplasm of colon: Secondary | ICD-10-CM | POA: Diagnosis not present

## 2017-08-04 ENCOUNTER — Telehealth: Payer: Self-pay

## 2017-08-04 NOTE — Telephone Encounter (Signed)
Patients wife called wanting to clarify which medication they were told to discontinue when patient was discharged from the hospital. I reviewed discharge summery with her and confirmed that they were instructed to d/c the percocet. Patients wife verbalized understanding.

## 2017-08-29 LAB — FECAL OCCULT BLOOD, GUAIAC: Fecal Occult Blood: NEGATIVE

## 2017-09-20 ENCOUNTER — Encounter: Payer: Self-pay | Admitting: Cardiology

## 2017-09-20 ENCOUNTER — Ambulatory Visit: Payer: Medicare HMO | Admitting: Cardiology

## 2017-09-20 VITALS — BP 136/78 | HR 67 | Ht 71.0 in | Wt 211.6 lb

## 2017-09-20 DIAGNOSIS — E785 Hyperlipidemia, unspecified: Secondary | ICD-10-CM

## 2017-09-20 DIAGNOSIS — I442 Atrioventricular block, complete: Secondary | ICD-10-CM

## 2017-09-20 DIAGNOSIS — I10 Essential (primary) hypertension: Secondary | ICD-10-CM

## 2017-09-20 DIAGNOSIS — I480 Paroxysmal atrial fibrillation: Secondary | ICD-10-CM

## 2017-09-20 MED ORDER — CARVEDILOL 6.25 MG PO TABS: 6.2500 mg | ORAL_TABLET | Freq: Two times a day (BID) | ORAL | 3 refills | Status: DC

## 2017-09-20 NOTE — Progress Notes (Signed)
Electrophysiology Office Note   Date:  09/20/2017   ID:  Dylan Ina., DOB 11/28/1940, MRN [id number]  PCP:  Dylan Chard, MD  Cardiologist:  Dylan [name] Primary Electrophysiologist:  Dylan Meredith Leeds, MD    Chief Complaint  Patient presents with  . Defib Check    Complete heart block     History of Present Illness: Dylan [name]. is a 76 y.o. 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 Presenting today for electrophysiology evaluation. He has a history of hypertension, hyperlipidemia, and diabetes with PAD and diabetic retinopathy. He's been having dyspnea and fatigue.  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  He has Environmental health practitioner dual-chamber pacemaker implanted 07/11/17.  Today, denies symptoms of palpitations, chest pain, shortness of breath, orthopnea, PND, lower extremity edema, claudication, dizziness, presyncope, syncope, bleeding, or neurologic sequela. The patient is tolerating medications without difficulties.  Feeling well today without complaints.  His device interrogation shows that he had 12 minutes of atrial fibrillation as well as 8 seconds worth of ventricular tachycardia.   Past Medical History:  Diagnosis Date  . Aortic stenosis    mild AS 11/24/16 (peak grad 24, mean grad 11) Dr. Einar [name]  . CHB (complete heart block) ([place]) 07/2017  . Diabetes mellitus without complication ([place])   . Gout   . Hypertension   . Peripheral arterial disease ([place])   . 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 Second degree AV block    Wenckebach; no indication for pacemaker as of 12/01/16 (Dr. Einar [name])   Past Surgical History:  Procedure Laterality Date  . CARDIOVASCULAR STRESS TEST     11/21/16 Low risk study, EF 52% [facility])  . EYE SURGERY    . INSERT / REPLACE / REMOVE PACEMAKER  07/11/2017  . KYPHOPLASTY N/A 02/15/2017   Procedure: LUMBAR FOUR KYPHOPLASTY;  Surgeon: Phylliss Bob,  MD;  Location: [facility];  Service: Orthopedics;  Laterality: N/A;  . lipoma surgery     neck - 30 years ago  . PACEMAKER IMPLANT N/A 07/11/2017   Procedure: Pacemaker Implant;  Surgeon: Constance Haw, MD;  Location: [facility];  Service: Cardiovascular;  Laterality: N/A;  . TRANSTHORACIC ECHOCARDIOGRAM     11/24/16 [facility]): EF 55-60%, mild AS, mild-mod MR, mod TR, moderate pulm HTN, PAP 49 mmHg     Current Outpatient Medications  Medication Sig Dispense Refill  . allopurinol (ZYLOPRIM) 100 MG tablet Take 100 mg by mouth daily.    [name] amLODipine (NORVASC) 10 MG tablet Take 10 mg by mouth daily.    . Ascorbic Acid (VITAMIN C) 1000 MG tablet Take 1,000 mg by mouth daily.    [name] aspirin 81 MG tablet Take 81 mg by mouth daily.    . carvedilol (COREG) 3.125 MG tablet Take 1 tablet (3.125 mg total) by mouth 2 (two) times daily with a meal. 60 tablet 6  . Cholecalciferol 5000 UNITS capsule Take 5,000 Units by mouth every evening.     . hydroxypropyl methylcellulose / hypromellose (ISOPTO TEARS / GONIOVISC) 2.5 % ophthalmic solution Place 1 drop into both eyes daily as needed for dry eyes.    . metFORMIN (GLUCOPHAGE) 1000 MG tablet Take 1,000 mg by mouth 2 (two) times daily with a meal.    . pioglitazone (ACTOS) 45 MG tablet Take 45 mg by mouth every evening.     . pravastatin (PRAVACHOL) 20 MG tablet  Take 20 mg by mouth every evening.    . sitaGLIPtin (JANUVIA) 100 MG tablet Take 100 mg by mouth daily.    . valsartan (DIOVAN) 320 MG tablet Take 320 mg by mouth daily.     No current facility-administered medications for this visit.     Allergies:   No known allergies   Social History:  The patient  reports that he has quit smoking. he has never used smokeless tobacco. He reports that he does not drink alcohol or use drugs.   Family History:  The patient's family history includes Breast cancer in his mother; Diabetes in his brother, brother, brother, brother, maternal grandmother,  mother, sister, sister, sister, and sister; Hypertension in his father.   ROS:  Please see the history of present illness.   Otherwise, review of systems is positive for none.   All other systems are reviewed and negative.   PHYSICAL EXAM: VS:  BP 136/78   Pulse 67   Ht 5\' 11"  (1.803 m)   Wt 211 lb 9.6 oz (96 kg)   BMI 29.51 kg/m  , BMI Body mass index is 29.51 kg/m. GEN: Well nourished, well developed, in no acute distress  HEENT: normal  Neck: no JVD, carotid bruits, or masses Cardiac: RRR; no murmurs, rubs, or gallops,no edema  Respiratory:  clear to auscultation bilaterally, normal work of breathing GI: soft, nontender, nondistended, + BS MS: no deformity or atrophy  Skin: warm and dry, device site well healed Neuro:  Strength and sensation are intact Psych: euthymic mood, full affect  EKG:  EKG is ordered today. Personal review of the ekg ordered shows sinus rhythm, V pacing  Personal review of the device interrogation today. Results in [place]: 02/09/2017: ALT 21 06/27/2017: BUN 29; Creatinine, Ser 1.40; Hemoglobin 11.0; Platelets 176; Potassium 4.9; Sodium 140    Lipid Panel  No results found for: CHOL, TRIG, HDL, CHOLHDL, VLDL, LDLCALC, LDLDIRECT   Wt Readings from Last 3 Encounters:  09/20/17 211 lb 9.6 oz (96 kg)  07/12/17 200 lb (90.7 kg)  06/27/17 209 lb (94.8 kg)      Other studies Reviewed: Additional studies/ records that were reviewed today include: TTE 11/24/16 Review of the above records today demonstrates:  Ejection fraction 55-60%, moderate LVH Left atrium mild to moderately dilated right atrium moderately dilated right ventricle moderately dilated normal right ventricular function Mild to moderate mitral regurgitation Moderate tricuspid regurgitation PA systolic pressure 49 mmHg   Myoview 11/21/16 Ejection fraction 50%, normal perfusion     ASSESSMENT AND PLAN:  1.  Complete heart block: Saint Jude dual-chamber pacemaker  implanted 07/11/17.  Device functioning appropriately.  Changes made for long-term therapy.  2. Hypertension: Well-controlled today.  3. Hyperlipidemia: Continue pravastatin  4.  Paroxysmal atrial fibrillation: Found on device interrogation.  Patient had 12 minutes worth.  Should he have more he Dylan likely need anticoagulation.  He also had atrial tachycardia.  We Dylan start 6.25 mg of carvedilol.  5.  Ventricular tachycardia: 8 seconds seen on device interrogation.  Starting carvedilol.  Current medicines are reviewed at length with the patient today.   The patient does not have concerns regarding his medicines.  The following changes were made today:  none  Labs/ tests ordered today include:  Orders Placed This Encounter  Procedures  . EKG 12-Lead     Disposition:   FU with Dylan Camnitz 9 months  Signed, Dylan Meredith Leeds, MD  09/20/2017 10:33 AM  [place] [address] [place] [address] [phone] (office) [phone] (fax)

## 2017-09-20 NOTE — Patient Instructions (Signed)
Medication Instructions:  Your physician recommends that you continue on your current medications as directed. Please refer to the Current Medication list given to you today.  * If you need a refill on your cardiac medications before your next appointment, please call your pharmacy. *  Labwork: None ordered  Testing/Procedures: None ordered  Follow-Up: Medication Instructions:  Your physician has recommended you make the following change in your medication: 1. START Carvedilol 6.25 mg twice daily  *If you need a refill on your cardiac medications before your next appointment, please call your pharmacy*  Labwork: None ordered  Testing/Procedures: None ordered  Follow-Up: Remote monitoring is used to monitor your Pacemaker or ICD from home. This monitoring reduces the number of office visits required to check your device to one time per year. It allows [place] to keep an eye on the functioning of your device to ensure it is working properly. You are scheduled for a device check from home on 12/20/2017. You may send your transmission at any time that day. If you have a wireless device, the transmission will be sent automatically. After your physician reviews your transmission, you will receive a postcard with your next transmission date.  Your physician wants you to follow-up in: 9 months with Dr. Curt Bears.  You will receive a reminder letter in the mail two months in advance. If you don't receive a letter, please call our office to schedule the follow-up appointment.  Thank you for choosing [facility]!!   Trinidad Curet, RN [phone]

## 2017-10-03 DIAGNOSIS — E113213 Type 2 diabetes mellitus with mild nonproliferative diabetic retinopathy with macular edema, bilateral: Secondary | ICD-10-CM | POA: Diagnosis not present

## 2017-10-03 LAB — CUP PACEART INCLINIC DEVICE CHECK
Brady Statistic RA Percent Paced: 4.4 %
Implantable Lead Implant Date: 20180911
Implantable Lead Implant Date: 20180911
Implantable Lead Location: 753859
Lead Channel Pacing Threshold Amplitude: 0.5 V
Lead Channel Pacing Threshold Amplitude: 0.75 V
Lead Channel Pacing Threshold Pulse Width: 0.5 ms
Lead Channel Pacing Threshold Pulse Width: 0.5 ms
Lead Channel Pacing Threshold Pulse Width: 0.5 ms
Lead Channel Sensing Intrinsic Amplitude: 3.4 mV
Lead Channel Sensing Intrinsic Amplitude: 8.6 mV
Lead Channel Setting Pacing Amplitude: 2 V
Lead Channel Setting Pacing Amplitude: 2.5 V
Lead Channel Setting Pacing Pulse Width: 0.5 ms
MDC IDC LEAD LOCATION: 753860
MDC IDC MSMT BATTERY REMAINING LONGEVITY: 104 mo
MDC IDC MSMT BATTERY VOLTAGE: 2.99 V
MDC IDC MSMT LEADCHNL RA IMPEDANCE VALUE: 450 Ohm
MDC IDC MSMT LEADCHNL RA PACING THRESHOLD AMPLITUDE: 0.75 V
MDC IDC MSMT LEADCHNL RA PACING THRESHOLD PULSEWIDTH: 0.5 ms
MDC IDC MSMT LEADCHNL RV IMPEDANCE VALUE: 462.5 Ohm
MDC IDC MSMT LEADCHNL RV PACING THRESHOLD AMPLITUDE: 0.5 V
MDC IDC PG IMPLANT DT: 20180911
MDC IDC SESS DTM: 20181121162029
MDC IDC SET LEADCHNL RV SENSING SENSITIVITY: 4 mV
MDC IDC STAT BRADY RV PERCENT PACED: 99.57 %
Pulse Gen Model: 2272
Pulse Gen Serial Number: 8940191

## 2017-10-05 DIAGNOSIS — Z9181 History of falling: Secondary | ICD-10-CM | POA: Diagnosis not present

## 2017-10-05 DIAGNOSIS — I129 Hypertensive chronic kidney disease with stage 1 through stage 4 chronic kidney disease, or unspecified chronic kidney disease: Secondary | ICD-10-CM | POA: Diagnosis not present

## 2017-10-05 DIAGNOSIS — H2513 Age-related nuclear cataract, bilateral: Secondary | ICD-10-CM | POA: Diagnosis not present

## 2017-10-05 DIAGNOSIS — N08 Glomerular disorders in diseases classified elsewhere: Secondary | ICD-10-CM | POA: Diagnosis not present

## 2017-10-05 DIAGNOSIS — E113313 Type 2 diabetes mellitus with moderate nonproliferative diabetic retinopathy with macular edema, bilateral: Secondary | ICD-10-CM | POA: Diagnosis not present

## 2017-10-05 DIAGNOSIS — E1122 Type 2 diabetes mellitus with diabetic chronic kidney disease: Secondary | ICD-10-CM | POA: Diagnosis not present

## 2017-10-05 DIAGNOSIS — H4321 Crystalline deposits in vitreous body, right eye: Secondary | ICD-10-CM | POA: Diagnosis not present

## 2017-10-05 DIAGNOSIS — N182 Chronic kidney disease, stage 2 (mild): Secondary | ICD-10-CM | POA: Diagnosis not present

## 2017-10-05 LAB — MICROALBUMIN, URINE: Microalb, Ur: ABNORMAL

## 2017-10-10 ENCOUNTER — Encounter: Payer: Medicare HMO | Admitting: Cardiology

## 2017-10-10 DIAGNOSIS — E1122 Type 2 diabetes mellitus with diabetic chronic kidney disease: Secondary | ICD-10-CM | POA: Diagnosis not present

## 2017-10-10 DIAGNOSIS — M109 Gout, unspecified: Secondary | ICD-10-CM | POA: Diagnosis not present

## 2017-10-10 DIAGNOSIS — R109 Unspecified abdominal pain: Secondary | ICD-10-CM | POA: Diagnosis not present

## 2017-10-10 DIAGNOSIS — N08 Glomerular disorders in diseases classified elsewhere: Secondary | ICD-10-CM | POA: Diagnosis not present

## 2017-10-10 DIAGNOSIS — I1310 Hypertensive heart and chronic kidney disease without heart failure, with stage 1 through stage 4 chronic kidney disease, or unspecified chronic kidney disease: Secondary | ICD-10-CM | POA: Diagnosis not present

## 2017-10-10 DIAGNOSIS — N182 Chronic kidney disease, stage 2 (mild): Secondary | ICD-10-CM | POA: Diagnosis not present

## 2017-10-20 ENCOUNTER — Other Ambulatory Visit: Payer: Self-pay | Admitting: Internal Medicine

## 2017-11-15 DIAGNOSIS — E113313 Type 2 diabetes mellitus with moderate nonproliferative diabetic retinopathy with macular edema, bilateral: Secondary | ICD-10-CM | POA: Diagnosis not present

## 2017-11-15 DIAGNOSIS — H2513 Age-related nuclear cataract, bilateral: Secondary | ICD-10-CM | POA: Diagnosis not present

## 2017-11-15 DIAGNOSIS — H4321 Crystalline deposits in vitreous body, right eye: Secondary | ICD-10-CM | POA: Diagnosis not present

## 2017-11-20 DIAGNOSIS — H2513 Age-related nuclear cataract, bilateral: Secondary | ICD-10-CM | POA: Diagnosis not present

## 2017-11-20 DIAGNOSIS — E113313 Type 2 diabetes mellitus with moderate nonproliferative diabetic retinopathy with macular edema, bilateral: Secondary | ICD-10-CM | POA: Diagnosis not present

## 2017-11-21 ENCOUNTER — Encounter: Payer: Self-pay | Admitting: Physician Assistant

## 2017-11-21 ENCOUNTER — Telehealth: Payer: Self-pay

## 2017-11-21 NOTE — Telephone Encounter (Signed)
   Primary Cardiologist: Adrian Prows, MD  Chart reviewed as part of pre-operative protocol coverage. Patient was contacted 11/21/2017 in reference to pre-operative risk assessment for pending surgery as outlined below.  Dylan [name]. was last seen on 09/20/2017 by Dr. Curt Bears.  Since that day, Dylan [name]. has done well.  He was not available, but according to his family, he has had no problems with chest pain, shortness of breath, presyncope or palpitations.  Therefore, based on ACC/AHA guidelines, the patient would be at acceptable risk for the planned procedure without further cardiovascular testing.   I will route this recommendation to the requesting party via Epic fax function and remove from pre-op pool.  Please call with questions.  Rosaria Ferries, PA-C 11/21/2017, 4:35 PM

## 2017-11-21 NOTE — Telephone Encounter (Signed)
   [facility] Pre-operative Risk Assessment    Request for surgical clearance:  1. What type of surgery is being performed? Cataract extraction with intraocular lens implantation of the RIGHT eye on March 14 followed by the LEFT eye on February 01, 2018.   2. When is this surgery scheduled? The RIGHT eye on January 11, 2018 followed by the LEFT eye on February 01, 2018.  3. Are there any medications that need to be held prior to surgery and how long? Please advise   4. Practice name and name of physician performing surgery? [facility]  5. What is your office phone and fax number? Phone: [phone] Fax: [phone]  6. Anesthesia type (None, local, MAC, general) ? Topical anesthesia and IV medication   Dylan [name] 11/21/2017, 9:33 AM  _________________________________________________________________   (provider comments below)

## 2017-11-22 ENCOUNTER — Telehealth: Payer: Self-pay | Admitting: Cardiology

## 2017-11-22 NOTE — Telephone Encounter (Signed)
Spoke w/ pt wife and informed her that pt remote transmission on 12-20-2017 should be automatic and if it is not automatic we will call and instruct them how to send a manual transmission. Pt wife verbalized understanding.

## 2017-11-22 NOTE — Telephone Encounter (Signed)
-----   Message from Patsey Berthold, NP sent at 11/22/2017 11:31 AM EST -----   ----- Message ----- From: Reola Mosher Sent: 11/21/2017   4:32 PM To: Lonn Georgia, PA-C, Patsey Berthold, NP  Mr. 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  He is scheduled for remote device check on February 20. I was calling regarding surgical clearance and his fiance/spouse said they do not know how to do that and are not sure what to do.  If someone from the device clinic could call them and make sure they understand the process, I think that would be very helpful. I emphasized the importance of keeping remote device check appointments.  Thanks

## 2017-12-04 DIAGNOSIS — M48062 Spinal stenosis, lumbar region with neurogenic claudication: Secondary | ICD-10-CM | POA: Diagnosis not present

## 2017-12-11 ENCOUNTER — Telehealth: Payer: Self-pay | Admitting: Cardiology

## 2017-12-11 DIAGNOSIS — M48062 Spinal stenosis, lumbar region with neurogenic claudication: Secondary | ICD-10-CM | POA: Diagnosis not present

## 2017-12-11 NOTE — Telephone Encounter (Signed)
Spoke with patient's wife in regards to whether or not the patient is taking Xarelto.Dylan [name]   He went for an injection in the lower back and the physician would not do it because he was under the impression that the patient was taking Xarelto.    I looked through all of the medication hx and did not recall any Xarelto.  She verbalized understanding and will contact the MD.

## 2017-12-11 NOTE — Telephone Encounter (Signed)
Dylan [name] is calling because they are wanting to know if Mr. Schwartz is supposed to be taking Xarelto . Please Call   Thanks

## 2017-12-20 ENCOUNTER — Ambulatory Visit (INDEPENDENT_AMBULATORY_CARE_PROVIDER_SITE_OTHER): Payer: Medicare HMO | Admitting: *Deleted

## 2017-12-20 DIAGNOSIS — I442 Atrioventricular block, complete: Secondary | ICD-10-CM | POA: Diagnosis not present

## 2017-12-20 NOTE — Progress Notes (Signed)
Remote pacemaker transmission.   

## 2017-12-21 ENCOUNTER — Encounter: Payer: Self-pay | Admitting: Cardiology

## 2017-12-27 DIAGNOSIS — H2513 Age-related nuclear cataract, bilateral: Secondary | ICD-10-CM | POA: Diagnosis not present

## 2017-12-27 DIAGNOSIS — E113313 Type 2 diabetes mellitus with moderate nonproliferative diabetic retinopathy with macular edema, bilateral: Secondary | ICD-10-CM | POA: Diagnosis not present

## 2017-12-27 DIAGNOSIS — H4321 Crystalline deposits in vitreous body, right eye: Secondary | ICD-10-CM | POA: Diagnosis not present

## 2017-12-28 DIAGNOSIS — M48062 Spinal stenosis, lumbar region with neurogenic claudication: Secondary | ICD-10-CM | POA: Diagnosis not present

## 2017-12-28 LAB — CUP PACEART REMOTE DEVICE CHECK
Battery Remaining Longevity: 98 mo
Battery Voltage: 3.01 V
Brady Statistic AP VP Percent: 6.2 %
Brady Statistic AS VP Percent: 94 %
Brady Statistic RA Percent Paced: 5.8 %
Date Time Interrogation Session: 20190220070014
Implantable Lead Implant Date: 20180911
Implantable Lead Location: 753860
Implantable Pulse Generator Implant Date: 20180911
Lead Channel Impedance Value: 440 Ohm
Lead Channel Pacing Threshold Amplitude: 0.75 V
Lead Channel Pacing Threshold Pulse Width: 0.5 ms
Lead Channel Sensing Intrinsic Amplitude: 5.2 mV
Lead Channel Setting Pacing Amplitude: 2 V
MDC IDC LEAD IMPLANT DT: 20180911
MDC IDC LEAD LOCATION: 753859
MDC IDC MSMT BATTERY REMAINING PERCENTAGE: 95.5 %
MDC IDC MSMT LEADCHNL RA IMPEDANCE VALUE: 390 Ohm
MDC IDC MSMT LEADCHNL RA SENSING INTR AMPL: 3.6 mV
MDC IDC MSMT LEADCHNL RV PACING THRESHOLD AMPLITUDE: 0.5 V
MDC IDC MSMT LEADCHNL RV PACING THRESHOLD PULSEWIDTH: 0.5 ms
MDC IDC SET LEADCHNL RV PACING AMPLITUDE: 2.5 V
MDC IDC SET LEADCHNL RV PACING PULSEWIDTH: 0.5 ms
MDC IDC SET LEADCHNL RV SENSING SENSITIVITY: 4 mV
MDC IDC STAT BRADY AP VS PERCENT: 1 %
MDC IDC STAT BRADY AS VS PERCENT: 1 %
MDC IDC STAT BRADY RV PERCENT PACED: 99 %
Pulse Gen Model: 2272
Pulse Gen Serial Number: 8940191

## 2017-12-29 DIAGNOSIS — H2513 Age-related nuclear cataract, bilateral: Secondary | ICD-10-CM | POA: Diagnosis not present

## 2017-12-29 DIAGNOSIS — E113313 Type 2 diabetes mellitus with moderate nonproliferative diabetic retinopathy with macular edema, bilateral: Secondary | ICD-10-CM | POA: Diagnosis not present

## 2018-01-10 DIAGNOSIS — I1310 Hypertensive heart and chronic kidney disease without heart failure, with stage 1 through stage 4 chronic kidney disease, or unspecified chronic kidney disease: Secondary | ICD-10-CM | POA: Diagnosis not present

## 2018-01-10 DIAGNOSIS — E1122 Type 2 diabetes mellitus with diabetic chronic kidney disease: Secondary | ICD-10-CM | POA: Diagnosis not present

## 2018-01-10 DIAGNOSIS — N182 Chronic kidney disease, stage 2 (mild): Secondary | ICD-10-CM | POA: Diagnosis not present

## 2018-01-10 DIAGNOSIS — E559 Vitamin D deficiency, unspecified: Secondary | ICD-10-CM | POA: Diagnosis not present

## 2018-01-10 DIAGNOSIS — N08 Glomerular disorders in diseases classified elsewhere: Secondary | ICD-10-CM | POA: Diagnosis not present

## 2018-01-10 DIAGNOSIS — M6281 Muscle weakness (generalized): Secondary | ICD-10-CM | POA: Diagnosis not present

## 2018-01-10 DIAGNOSIS — Z1389 Encounter for screening for other disorder: Secondary | ICD-10-CM | POA: Diagnosis not present

## 2018-01-10 LAB — TSH: TSH: 1.35 (ref <=5.90)

## 2018-01-10 LAB — VITAMIN D 25 HYDROXY (VIT D DEFICIENCY, FRACTURES): Vit D, 25-Hydroxy: 50

## 2018-01-10 LAB — VITAMIN B12: Vitamin B-12: 173

## 2018-01-11 DIAGNOSIS — H2511 Age-related nuclear cataract, right eye: Secondary | ICD-10-CM | POA: Diagnosis not present

## 2018-01-12 DIAGNOSIS — H2512 Age-related nuclear cataract, left eye: Secondary | ICD-10-CM | POA: Diagnosis not present

## 2018-01-15 DIAGNOSIS — M48062 Spinal stenosis, lumbar region with neurogenic claudication: Secondary | ICD-10-CM | POA: Diagnosis not present

## 2018-01-17 DIAGNOSIS — D519 Vitamin B12 deficiency anemia, unspecified: Secondary | ICD-10-CM | POA: Diagnosis not present

## 2018-01-26 DIAGNOSIS — D519 Vitamin B12 deficiency anemia, unspecified: Secondary | ICD-10-CM | POA: Diagnosis not present

## 2018-02-01 DIAGNOSIS — H2512 Age-related nuclear cataract, left eye: Secondary | ICD-10-CM | POA: Diagnosis not present

## 2018-02-02 DIAGNOSIS — N182 Chronic kidney disease, stage 2 (mild): Secondary | ICD-10-CM | POA: Diagnosis not present

## 2018-02-02 DIAGNOSIS — D519 Vitamin B12 deficiency anemia, unspecified: Secondary | ICD-10-CM | POA: Diagnosis not present

## 2018-02-02 DIAGNOSIS — I129 Hypertensive chronic kidney disease with stage 1 through stage 4 chronic kidney disease, or unspecified chronic kidney disease: Secondary | ICD-10-CM | POA: Diagnosis not present

## 2018-02-08 DIAGNOSIS — M48062 Spinal stenosis, lumbar region with neurogenic claudication: Secondary | ICD-10-CM | POA: Diagnosis not present

## 2018-02-21 DIAGNOSIS — H43391 Other vitreous opacities, right eye: Secondary | ICD-10-CM | POA: Diagnosis not present

## 2018-02-21 DIAGNOSIS — E113313 Type 2 diabetes mellitus with moderate nonproliferative diabetic retinopathy with macular edema, bilateral: Secondary | ICD-10-CM | POA: Diagnosis not present

## 2018-02-21 DIAGNOSIS — H4321 Crystalline deposits in vitreous body, right eye: Secondary | ICD-10-CM | POA: Diagnosis not present

## 2018-03-01 DIAGNOSIS — R531 Weakness: Secondary | ICD-10-CM | POA: Diagnosis not present

## 2018-03-01 DIAGNOSIS — D519 Vitamin B12 deficiency anemia, unspecified: Secondary | ICD-10-CM | POA: Diagnosis not present

## 2018-03-01 HISTORY — DX: Vitamin B12 deficiency anemia, unspecified: D51.9

## 2018-03-21 ENCOUNTER — Ambulatory Visit (INDEPENDENT_AMBULATORY_CARE_PROVIDER_SITE_OTHER): Payer: Medicare HMO | Admitting: *Deleted

## 2018-03-21 DIAGNOSIS — I442 Atrioventricular block, complete: Secondary | ICD-10-CM | POA: Diagnosis not present

## 2018-03-21 DIAGNOSIS — I10 Essential (primary) hypertension: Secondary | ICD-10-CM

## 2018-03-21 NOTE — Progress Notes (Signed)
Remote pacemaker transmission.   

## 2018-03-22 LAB — CUP PACEART REMOTE DEVICE CHECK
Battery Remaining Longevity: 97 mo
Battery Remaining Percentage: 95.5 %
Battery Voltage: 2.99 V
Brady Statistic AP VS Percent: 1 %
Brady Statistic AS VS Percent: 1 %
Date Time Interrogation Session: 20190522060015
Implantable Lead Implant Date: 20180911
Implantable Lead Location: 753860
Implantable Pulse Generator Implant Date: 20180911
Lead Channel Impedance Value: 400 Ohm
Lead Channel Pacing Threshold Amplitude: 0.5 V
Lead Channel Pacing Threshold Amplitude: 0.75 V
Lead Channel Pacing Threshold Pulse Width: 0.5 ms
Lead Channel Setting Pacing Amplitude: 2 V
Lead Channel Setting Pacing Pulse Width: 0.5 ms
MDC IDC LEAD IMPLANT DT: 20180911
MDC IDC LEAD LOCATION: 753859
MDC IDC MSMT LEADCHNL RA SENSING INTR AMPL: 3.4 mV
MDC IDC MSMT LEADCHNL RV IMPEDANCE VALUE: 440 Ohm
MDC IDC MSMT LEADCHNL RV PACING THRESHOLD PULSEWIDTH: 0.5 ms
MDC IDC MSMT LEADCHNL RV SENSING INTR AMPL: 5.5 mV
MDC IDC SET LEADCHNL RV PACING AMPLITUDE: 2.5 V
MDC IDC SET LEADCHNL RV SENSING SENSITIVITY: 4 mV
MDC IDC STAT BRADY AP VP PERCENT: 9.3 %
MDC IDC STAT BRADY AS VP PERCENT: 90 %
MDC IDC STAT BRADY RA PERCENT PACED: 8.9 %
MDC IDC STAT BRADY RV PERCENT PACED: 99 %
Pulse Gen Model: 2272
Pulse Gen Serial Number: 8940191

## 2018-04-18 DIAGNOSIS — H4321 Crystalline deposits in vitreous body, right eye: Secondary | ICD-10-CM | POA: Diagnosis not present

## 2018-04-18 DIAGNOSIS — H43391 Other vitreous opacities, right eye: Secondary | ICD-10-CM | POA: Diagnosis not present

## 2018-04-18 DIAGNOSIS — H2513 Age-related nuclear cataract, bilateral: Secondary | ICD-10-CM | POA: Diagnosis not present

## 2018-04-18 DIAGNOSIS — E113313 Type 2 diabetes mellitus with moderate nonproliferative diabetic retinopathy with macular edema, bilateral: Secondary | ICD-10-CM | POA: Diagnosis not present

## 2018-05-01 ENCOUNTER — Telehealth: Payer: Self-pay | Admitting: *Deleted

## 2018-05-01 NOTE — Telephone Encounter (Signed)
04/28/18 28 beats NSVT detected on pacemaker. Reviewed with Dr. Curt Bears- order to increase coreg to 12.5mg  BID.   LMOM to call back to [facility].

## 2018-05-02 ENCOUNTER — Telehealth: Payer: Self-pay

## 2018-05-02 MED ORDER — CARVEDILOL 12.5 MG PO TABS: 12.5000 mg | ORAL_TABLET | Freq: Two times a day (BID) | ORAL | 3 refills | Status: DC

## 2018-05-02 NOTE — Telephone Encounter (Signed)
Spoke with pts' wife who stated that pt was out of town asked pts wife when he would be back in town and she stated that he doesn't usually answer the telephone, informed pts wife that Dr. Ileana Ladd had recommended pt increase his medication due to the bottom part of his heart going fast and out of sync with the top part, pt's wife stated that she would pick the medication up and get it to the pt asked her to have the pt call the device clinic back, she stated that she would have him call

## 2018-05-02 NOTE — Telephone Encounter (Signed)
Spoke with Dylan [name] regarding VHR episode from 04/18/18@ 11:17 Dylan [name] denied any symptoms during this time Dylan [name] voiced undestanding of medication increase and Dylan [name] gave verbal ok to disclose health information to [name] his wife.

## 2018-05-08 ENCOUNTER — Other Ambulatory Visit: Payer: Self-pay | Admitting: Cardiology

## 2018-06-13 ENCOUNTER — Observation Stay (HOSPITAL_COMMUNITY)
Admission: EM | Admit: 2018-06-13 | Discharge: 2018-06-14 | Disposition: A | Discharge status: Home / Self Care | Payer: Medicare HMO | Attending: Internal Medicine | Admitting: Internal Medicine

## 2018-06-13 ENCOUNTER — Encounter (HOSPITAL_COMMUNITY): Payer: Self-pay

## 2018-06-13 ENCOUNTER — Emergency Department (HOSPITAL_COMMUNITY): Payer: Medicare HMO

## 2018-06-13 DIAGNOSIS — Z7984 Long term (current) use of oral hypoglycemic drugs: Secondary | ICD-10-CM | POA: Diagnosis not present

## 2018-06-13 DIAGNOSIS — R569 Unspecified convulsions: Secondary | ICD-10-CM | POA: Diagnosis not present

## 2018-06-13 DIAGNOSIS — I472 Ventricular tachycardia: Secondary | ICD-10-CM | POA: Insufficient documentation

## 2018-06-13 DIAGNOSIS — Z87891 Personal history of nicotine dependence: Secondary | ICD-10-CM | POA: Diagnosis not present

## 2018-06-13 DIAGNOSIS — N1831 Chronic kidney disease, stage 3a: Secondary | ICD-10-CM

## 2018-06-13 DIAGNOSIS — I10 Essential (primary) hypertension: Secondary | ICD-10-CM | POA: Diagnosis not present

## 2018-06-13 DIAGNOSIS — I350 Nonrheumatic aortic (valve) stenosis: Secondary | ICD-10-CM | POA: Diagnosis not present

## 2018-06-13 DIAGNOSIS — E1151 Type 2 diabetes mellitus with diabetic peripheral angiopathy without gangrene: Secondary | ICD-10-CM | POA: Diagnosis not present

## 2018-06-13 DIAGNOSIS — E1122 Type 2 diabetes mellitus with diabetic chronic kidney disease: Secondary | ICD-10-CM | POA: Insufficient documentation

## 2018-06-13 DIAGNOSIS — R55 Syncope and collapse: Principal | ICD-10-CM | POA: Insufficient documentation

## 2018-06-13 DIAGNOSIS — I442 Atrioventricular block, complete: Secondary | ICD-10-CM | POA: Diagnosis not present

## 2018-06-13 DIAGNOSIS — Z950 Presence of cardiac pacemaker: Secondary | ICD-10-CM | POA: Insufficient documentation

## 2018-06-13 DIAGNOSIS — N179 Acute kidney failure, unspecified: Secondary | ICD-10-CM | POA: Diagnosis not present

## 2018-06-13 DIAGNOSIS — M109 Gout, unspecified: Secondary | ICD-10-CM | POA: Insufficient documentation

## 2018-06-13 DIAGNOSIS — N183 Chronic kidney disease, stage 3 unspecified: Secondary | ICD-10-CM | POA: Diagnosis present

## 2018-06-13 DIAGNOSIS — D631 Anemia in chronic kidney disease: Secondary | ICD-10-CM | POA: Diagnosis not present

## 2018-06-13 DIAGNOSIS — E860 Dehydration: Secondary | ICD-10-CM | POA: Diagnosis not present

## 2018-06-13 DIAGNOSIS — I129 Hypertensive chronic kidney disease with stage 1 through stage 4 chronic kidney disease, or unspecified chronic kidney disease: Secondary | ICD-10-CM | POA: Diagnosis not present

## 2018-06-13 DIAGNOSIS — I700 Atherosclerosis of aorta: Secondary | ICD-10-CM | POA: Diagnosis not present

## 2018-06-13 DIAGNOSIS — E119 Type 2 diabetes mellitus without complications: Secondary | ICD-10-CM

## 2018-06-13 DIAGNOSIS — R531 Weakness: Secondary | ICD-10-CM | POA: Diagnosis not present

## 2018-06-13 DIAGNOSIS — Z7982 Long term (current) use of aspirin: Secondary | ICD-10-CM | POA: Insufficient documentation

## 2018-06-13 DIAGNOSIS — I739 Peripheral vascular disease, unspecified: Secondary | ICD-10-CM | POA: Diagnosis present

## 2018-06-13 DIAGNOSIS — I959 Hypotension, unspecified: Secondary | ICD-10-CM | POA: Diagnosis not present

## 2018-06-13 DIAGNOSIS — E1165 Type 2 diabetes mellitus with hyperglycemia: Secondary | ICD-10-CM | POA: Diagnosis not present

## 2018-06-13 DIAGNOSIS — R262 Difficulty in walking, not elsewhere classified: Secondary | ICD-10-CM | POA: Insufficient documentation

## 2018-06-13 LAB — CBC WITH DIFFERENTIAL/PLATELET
ABS IMMATURE GRANULOCYTES: 0 10*3/uL (ref 0.0–0.1)
Basophils Absolute: 0 10*3/uL (ref 0.0–0.1)
Basophils Relative: 1 %
Eosinophils Absolute: 0.1 10*3/uL (ref 0.0–0.7)
Eosinophils Relative: 2 %
HEMATOCRIT: 36.8 % — AB (ref 39.0–52.0)
HEMOGLOBIN: 11.2 g/dL — AB (ref 13.0–17.0)
IMMATURE GRANULOCYTES: 1 %
LYMPHS ABS: 1 10*3/uL (ref 0.7–4.0)
Lymphocytes Relative: 16 %
MCH: 28.1 pg (ref 26.0–34.0)
MCHC: 30.4 g/dL (ref 30.0–36.0)
MCV: 92.5 fL (ref 78.0–100.0)
MONOS PCT: 14 %
Monocytes Absolute: 0.8 10*3/uL (ref 0.1–1.0)
NEUTROS ABS: 4 10*3/uL (ref 1.7–7.7)
NEUTROS PCT: 66 %
Platelets: 163 10*3/uL (ref 150–400)
RBC: 3.98 MIL/uL — ABNORMAL LOW (ref 4.22–5.81)
RDW: 13.3 % (ref 11.5–15.5)
WBC: 6 10*3/uL (ref 4.0–10.5)

## 2018-06-13 LAB — URINALYSIS, ROUTINE W REFLEX MICROSCOPIC
BACTERIA UA: NONE SEEN
BILIRUBIN URINE: NEGATIVE
Glucose, UA: 50 mg/dL — AB
HGB URINE DIPSTICK: NEGATIVE
Ketones, ur: NEGATIVE mg/dL
LEUKOCYTES UA: NEGATIVE
Nitrite: NEGATIVE
PROTEIN: 30 mg/dL — AB
SPECIFIC GRAVITY, URINE: 1.018 (ref 1.005–1.030)
pH: 7 (ref 5.0–8.0)

## 2018-06-13 LAB — COMPREHENSIVE METABOLIC PANEL
ALBUMIN: 3.5 g/dL (ref 3.5–5.0)
ALT: 16 U/L (ref 0–44)
AST: 24 U/L (ref 15–41)
Alkaline Phosphatase: 79 U/L (ref 38–126)
Anion gap: 8 (ref 5–15)
BILIRUBIN TOTAL: 0.6 mg/dL (ref 0.3–1.2)
BUN: 24 mg/dL — AB (ref 8–23)
CHLORIDE: 109 mmol/L (ref 98–111)
CO2: 25 mmol/L (ref 22–32)
Calcium: 9.6 mg/dL (ref 8.9–10.3)
Creatinine, Ser: 2.15 mg/dL — ABNORMAL HIGH (ref 0.61–1.24)
GFR calc Af Amer: 33 mL/min — ABNORMAL LOW (ref >60)
GFR calc non Af Amer: 28 mL/min — ABNORMAL LOW (ref >60)
GLUCOSE: 181 mg/dL — AB (ref 70–99)
POTASSIUM: 5.1 mmol/L (ref 3.5–5.1)
Sodium: 142 mmol/L (ref 135–145)
TOTAL PROTEIN: 6.6 g/dL (ref 6.5–8.1)

## 2018-06-13 LAB — TROPONIN I: Troponin I: <0.03 ng/mL (ref <0.03)

## 2018-06-13 LAB — SODIUM, URINE, RANDOM: Sodium, Ur: 96 mmol/L

## 2018-06-13 LAB — GLUCOSE, CAPILLARY: Glucose-Capillary: 238 mg/dL — ABNORMAL HIGH (ref 70–99)

## 2018-06-13 LAB — BRAIN NATRIURETIC PEPTIDE: B Natriuretic Peptide: 442.7 pg/mL — ABNORMAL HIGH (ref 0.0–100.0)

## 2018-06-13 LAB — CREATININE, URINE, RANDOM: Creatinine, Urine: 177.96 mg/dL

## 2018-06-13 MED ORDER — PRAVASTATIN SODIUM 20 MG PO TABS
20.0000 mg | ORAL_TABLET | Freq: Every evening | ORAL | Status: DC
  Administered 2018-06-14 (×2): 20 mg via ORAL
  Filled 2018-06-13: qty 1

## 2018-06-13 MED ORDER — VITAMIN D3 125 MCG (5000 UT) PO CAPS: 5000.0000 [IU] | ORAL_CAPSULE | Freq: Every evening | ORAL | Status: DC

## 2018-06-13 MED ORDER — HEPARIN SODIUM (PORCINE) 5000 UNIT/ML IJ SOLN
5000.0000 [IU] | Freq: Three times a day (TID) | INTRAMUSCULAR | Status: DC
  Administered 2018-06-14 (×3): 5000 [IU] via SUBCUTANEOUS
  Filled 2018-06-13 (×3): qty 1

## 2018-06-13 MED ORDER — INSULIN ASPART 100 UNIT/ML ~~LOC~~ SOLN
0.0000 [IU] | Freq: Every day | SUBCUTANEOUS | Status: DC
  Administered 2018-06-14: 2 [IU] via SUBCUTANEOUS

## 2018-06-13 MED ORDER — SODIUM CHLORIDE 0.9 % IV SOLN
INTRAVENOUS | Status: AC
  Administered 2018-06-13: 23:00:00 via INTRAVENOUS

## 2018-06-13 MED ORDER — VITAMIN D 1000 UNITS PO TABS
5000.0000 [IU] | ORAL_TABLET | Freq: Every evening | ORAL | Status: DC
  Administered 2018-06-14 (×2): 5000 [IU] via ORAL
  Filled 2018-06-13: qty 5

## 2018-06-13 MED ORDER — ACETAMINOPHEN 325 MG PO TABS
650.0000 mg | ORAL_TABLET | Freq: Four times a day (QID) | ORAL | Status: DC | PRN
  Filled 2018-06-13: qty 2

## 2018-06-13 MED ORDER — VITAMIN C 500 MG PO TABS
1000.0000 mg | ORAL_TABLET | Freq: Every day | ORAL | Status: DC
  Administered 2018-06-14: 1000 mg via ORAL
  Filled 2018-06-13: qty 2

## 2018-06-13 MED ORDER — CARVEDILOL 12.5 MG PO TABS
12.5000 mg | ORAL_TABLET | Freq: Two times a day (BID) | ORAL | Status: DC
  Administered 2018-06-14 (×2): 12.5 mg via ORAL
  Filled 2018-06-13 (×2): qty 1

## 2018-06-13 MED ORDER — AMLODIPINE BESYLATE 5 MG PO TABS
5.0000 mg | ORAL_TABLET | Freq: Every day | ORAL | Status: DC
  Administered 2018-06-14: 5 mg via ORAL
  Filled 2018-06-13: qty 1

## 2018-06-13 MED ORDER — ONDANSETRON HCL 4 MG PO TABS: 4.0000 mg | ORAL_TABLET | Freq: Four times a day (QID) | ORAL | Status: DC | PRN

## 2018-06-13 MED ORDER — INSULIN ASPART 100 UNIT/ML ~~LOC~~ SOLN
0.0000 [IU] | Freq: Three times a day (TID) | SUBCUTANEOUS | Status: DC
  Administered 2018-06-14: 2 [IU] via SUBCUTANEOUS

## 2018-06-13 MED ORDER — ALLOPURINOL 100 MG PO TABS
100.0000 mg | ORAL_TABLET | Freq: Every day | ORAL | Status: DC
  Administered 2018-06-14: 100 mg via ORAL
  Filled 2018-06-13: qty 1

## 2018-06-13 MED ORDER — ASPIRIN 81 MG PO TABS: 81.0000 mg | ORAL_TABLET | ORAL | Status: DC

## 2018-06-13 MED ORDER — ACETAMINOPHEN 650 MG RE SUPP: 650.0000 mg | Freq: Four times a day (QID) | RECTAL | Status: DC | PRN

## 2018-06-13 MED ORDER — ASPIRIN 81 MG PO CHEW: 81.0000 mg | CHEWABLE_TABLET | ORAL | Status: DC

## 2018-06-13 MED ORDER — HYPROMELLOSE (GONIOSCOPIC) 2.5 % OP SOLN
1.0000 [drp] | Freq: Every day | OPHTHALMIC | Status: DC | PRN
  Filled 2018-06-13: qty 15

## 2018-06-13 MED ORDER — SODIUM CHLORIDE 0.9% FLUSH
3.0000 mL | Freq: Two times a day (BID) | INTRAVENOUS | Status: DC
  Administered 2018-06-14: 3 mL via INTRAVENOUS

## 2018-06-13 MED ORDER — ONDANSETRON HCL 4 MG/2ML IJ SOLN: 4.0000 mg | Freq: Four times a day (QID) | INTRAMUSCULAR | Status: DC | PRN

## 2018-06-13 NOTE — ED Notes (Signed)
Opyd, MD at bedside 

## 2018-06-13 NOTE — ED Notes (Signed)
Dr Long at bedside

## 2018-06-13 NOTE — ED Provider Notes (Signed)
Emergency Department Provider Note   I have reviewed the triage vital signs and the nursing notes.   HISTORY  Chief Complaint Loss of Consciousness   HPI Dylan [name]. is a 77 y.o. male with PMH of DM, HTN, AS, and complete heart block with pacemaker resents to the emergency department after being found unresponsive on his lawnmower.  The patient states he was feeling okay this morning and went out to cut the grass.  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  He states he was feeling weak and lightheaded.  A neighbor saw the patient sitting in the mower in his garage and came over after he did not move for a long time.  It is estimated that the episode lasted for approximately 20 minutes.  He did not fall off of the lawnmower.  Patient does not recall losing consciousness but the neighbor states that he was totally unresponsive to verbal commands.  He denies having any chest pain, heart palpitations, shortness of breath.  Raquel Sarna thinks that his Coreg may have been increased recently but no other medication adjustments.  No fevers or chills.    Past Medical History:  Diagnosis Date  . Aortic stenosis    mild AS 11/24/16 (peak grad 24, mean grad 11) Dr. [name]  . CHB (complete heart block) ([place]) 07/2017  . Diabetes mellitus without complication ([place])   . Gout   . Hypertension   . Peripheral arterial disease ([place])   . 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 Second degree AV block    Wenckebach; no indication for pacemaker as of 12/01/16 (Dr. [name])    Patient Active Problem List   Diagnosis Date Noted  . CKD (chronic kidney disease), stage III ([place]) 06/13/2018  . Syncope 06/13/2018  . 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[place]) 07/11/2017  . 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[place]) 06/23/2015  . Peripheral arterial disease ([place]) 05/13/2014  . Essential hypertension 05/13/2014  . Diabetes mellitus type II, non insulin  dependent ([place]) 05/13/2014  . Second degree AV block 05/13/2014    Past Surgical History:  Procedure Laterality Date  . CARDIOVASCULAR STRESS TEST     11/21/16 Low risk study, EF 52% [facility])  . EYE SURGERY    . KYPHOPLASTY N/A 02/15/2017   Procedure: LUMBAR FOUR KYPHOPLASTY;  Surgeon: Phylliss Bob, MD;  Location: [place];  Service: Orthopedics;  Laterality: N/A;  . lipoma surgery     neck - 30 years ago  . PACEMAKER IMPLANT N/A 07/11/2017   Procedure: Pacemaker Implant;  Surgeon: Constance Haw, MD;  Location: [place] CV [facility];  Service: Cardiovascular;  Laterality: N/A;  . TRANSTHORACIC ECHOCARDIOGRAM     11/24/16 [facility] CV): EF 55-60%, mild AS, mild-mod MR, mod TR, moderate pulm HTN, PAP 49 mmHg    Allergies Patient has no known allergies.  Family History  Problem Relation Age of Onset  . Diabetes Mother   . Breast cancer Mother   . Hypertension Father   . Diabetes Sister   . Diabetes Brother   . Diabetes Maternal Grandmother   . Diabetes Brother   . Diabetes Brother   . Diabetes Brother   . Diabetes Sister   . Diabetes Sister   . Diabetes Sister     Social History Social History   Tobacco Use  . Smoking status: Former Research scientist (life sciences)  . Smokeless tobacco: Never Used  . Tobacco comment: quit 35 years  Substance  Use Topics  . Alcohol use: No  . Drug use: No    Review of Systems  Constitutional: No fever/chills. Positive unresponsive episode.  Eyes: No visual changes. ENT: No sore throat. Cardiovascular: Denies chest pain. Respiratory: Denies shortness of breath. Gastrointestinal: No abdominal pain.  No nausea, no vomiting.  No diarrhea.  No constipation. Genitourinary: Negative for dysuria. Musculoskeletal: Negative for back pain. Skin: Negative for rash. Neurological: Negative for headaches, focal weakness or numbness.  10-point ROS otherwise negative.  ____________________________________________   PHYSICAL EXAM:  VITAL  SIGNS: ED Triage Vitals  Enc Vitals Group     BP 06/13/18 1548 (!) 127/56     Pulse Rate 06/13/18 1551 65     Resp 06/13/18 1551 19     Temp 06/13/18 1548 97.9 F (36.6 C)     Temp Source 06/13/18 1548 Oral     SpO2 06/13/18 1542 99 %     Weight 06/13/18 1545 214 lb (97.1 kg)     Height 06/13/18 1545 5\' 11"  (1.803 m)     Pain Score 06/13/18 1545 0   Constitutional: Alert and oriented. Well appearing and in no acute distress. Eyes: Conjunctivae are normal. PERRL.  Head: Atraumatic. Nose: No congestion/rhinnorhea. Mouth/Throat: Mucous membranes are moist.  Oropharynx non-erythematous. Neck: No stridor.   Cardiovascular: Normal rate, regular rhythm. Good peripheral circulation. Grossly normal heart sounds.   Respiratory: Normal respiratory effort.  No retractions. Lungs CTAB. Gastrointestinal: Soft and nontender. No distention.  Musculoskeletal: No lower extremity tenderness nor edema. No gross deformities of extremities. Neurologic:  Normal speech and language. No gross focal neurologic deficits are appreciated.  Skin:  Skin is warm, dry and intact. No rash noted.  ____________________________________________   LABS (all labs ordered are listed, but only abnormal results are displayed)  Labs Reviewed  COMPREHENSIVE METABOLIC PANEL - Abnormal; Notable for the following components:      Result Value   Glucose, Bld 181 (*)    BUN 24 (*)    Creatinine, Ser 2.15 (*)    GFR calc non Af Amer 28 (*)    GFR calc Af Amer 33 (*)    All other components within normal limits  BRAIN NATRIURETIC PEPTIDE - Abnormal; Notable for the following components:   B Natriuretic Peptide 442.7 (*)    All other components within normal limits  CBC WITH DIFFERENTIAL/PLATELET - Abnormal; Notable for the following components:   RBC 3.98 (*)    Hemoglobin 11.2 (*)    HCT 36.8 (*)    All other components within normal limits  URINALYSIS, ROUTINE W REFLEX MICROSCOPIC - Abnormal; Notable for the  following components:   APPearance HAZY (*)    Glucose, UA 50 (*)    Protein, ur 30 (*)    All other components within normal limits  BASIC METABOLIC PANEL - Abnormal; Notable for the following components:   BUN 28 (*)    Creatinine, Ser 1.75 (*)    GFR calc non Af Amer 36 (*)    GFR calc Af Amer 42 (*)    All other components within normal limits  CBC - Abnormal; Notable for the following components:   RBC 3.52 (*)    Hemoglobin 9.9 (*)    HCT 32.0 (*)    Platelets 148 (*)    All other components within normal limits  GLUCOSE, CAPILLARY - Abnormal; Notable for the following components:   Glucose-Capillary 238 (*)    All other components within normal limits  GLUCOSE, CAPILLARY -  Abnormal; Notable for the following components:   Glucose-Capillary 105 (*)    All other components within normal limits  TROPONIN I  SODIUM, URINE, RANDOM  CREATININE, URINE, RANDOM  UREA NITROGEN, URINE   ____________________________________________  EKG   EKG Interpretation  Date/Time:  Wednesday June 13 2018 15:49:20 EDT Ventricular Rate:  66 PR Interval:    QRS Duration: 135 QT Interval:  455 QTC Calculation: 477 R Axis:   -88 Text Interpretation:  Sinus rhythm Borderline prolonged PR interval Nonspecific IVCD with LAD Left ventricular hypertrophy Anterior infarct, old No STEMI.  Confirmed by Nanda Quinton [phone]) on 06/13/2018 5:11:01 PM       ____________________________________________  RADIOLOGY  Dg Chest 2 View  Result Date: 06/13/2018 CLINICAL DATA:  Unresponsive.  Pacer.  Ex-smoker. EXAM: CHEST - 2 VIEW COMPARISON:  07/12/2017 FINDINGS: Pacer with leads at right atrium and right ventricle. No lead discontinuity. Mild right hemidiaphragm eventration anteriorly. Midline trachea. Normal heart size and mediastinal contours. Atherosclerosis in the transverse aorta. No pleural effusion or pneumothorax. There may be a calcified granuloma projecting over the left lung apex, similar.  IMPRESSION: No acute cardiopulmonary disease. Aortic Atherosclerosis (ICD10-I70.0). Electronically Signed   By: Abigail Miyamoto M.D.   On: 06/13/2018 17:34    ____________________________________________   PROCEDURES  Procedure(s) performed:   Procedures  None ____________________________________________   INITIAL IMPRESSION / ASSESSMENT AND PLAN / ED COURSE  Pertinent labs & imaging results that were available during my care of the patient were reviewed by me and considered in my medical decision making (see chart for details).  Patient presents to the emergency department for evaluation of episode of unresponsiveness.  Is unclear exactly what happened by history but patient reportedly unresponsive for approximately 20 minutes.  No clear seizure activity.  Patient does have significant heart history including complete heart block requiring pacemaker.  Review of recent telephone encounter showed the patient had episodes of NSVT on pacemaker interrogation. Will interrogate here. Sending screening labs. No report to suggest a post-ictal state. Plan for CXR and reassess.   CXR and labs unremarkable. Spoke with [employer] device rep. No arrhythmia today. 2 months ago patient with NVST which was known. Plan for obs admit.   Discussed patient's case with Hospitalist, Dr. Myna Hidalgo to request admission. Patient and family (if present) updated with plan. Care transferred to Hospitalist service.  I reviewed all nursing notes, vitals, pertinent old records, EKGs, labs, imaging (as available).  ____________________________________________  FINAL CLINICAL IMPRESSION(S) / ED DIAGNOSES  Final diagnoses:  Syncope and collapse     MEDICATIONS GIVEN DURING THIS VISIT:  Medications  allopurinol (ZYLOPRIM) tablet 100 mg (has no administration in time range)  amLODipine (NORVASC) tablet 5 mg (5 mg Oral Given 06/14/18 0037)  carvedilol (COREG) tablet 12.5 mg (12.5 mg Oral Given 06/14/18 0037)  pravastatin  (PRAVACHOL) tablet 20 mg (20 mg Oral Given 06/14/18 0043)  vitamin C (ASCORBIC ACID) tablet 1,000 mg (has no administration in time range)  hydroxypropyl methylcellulose / hypromellose (ISOPTO TEARS / GONIOVISC) 2.5 % ophthalmic solution 1 drop (has no administration in time range)  sodium chloride flush (NS) 0.9 % injection 3 mL (3 mLs Intravenous Given 06/14/18 0038)  heparin injection 5,000 Units (5,000 Units Subcutaneous Given 06/14/18 0540)  0.9 %  sodium chloride infusion ( Intravenous Rate/Dose Verify 06/14/18 0600)  acetaminophen (TYLENOL) tablet 650 mg (has no administration in time range)    Or  acetaminophen (TYLENOL) suppository 650 mg (has no administration in time range)  ondansetron (ZOFRAN) tablet 4 mg (has no administration in time range)    Or  ondansetron (ZOFRAN) injection 4 mg (has no administration in time range)  insulin aspart (novoLOG) injection 0-9 Units (has no administration in time range)  insulin aspart (novoLOG) injection 0-5 Units (2 Units Subcutaneous Given 06/14/18 0039)  aspirin chewable tablet 81 mg (has no administration in time range)  cholecalciferol (VITAMIN D) tablet 5,000 Units (5,000 Units Oral Given 06/14/18 0042)    Note:  This document was prepared using Dragon voice recognition software and may include unintentional dictation errors.  Nanda Quinton, MD Emergency Medicine    Long, Wonda Old[facility], MD 06/14/18 0830

## 2018-06-13 NOTE — ED Notes (Signed)
Pt attempting to give urine sample

## 2018-06-13 NOTE — ED Notes (Signed)
Plz interrogate again

## 2018-06-13 NOTE — H&P (Signed)
History and Physical    Dylan [name]. DXA:[id number] DOB: 05-Feb-1941 DOA: 06/13/2018  PCP: [name], MD   Patient coming from: Home   Chief Complaint: Syncope or near-syncope   HPI: Dylan [name]. is a 77 y.o. male with medical history significant for chronic kidney disease, hypertension, complete heart block with pacer, type 2 diabetes mellitus, and peripheral arterial disease, now presenting to the emergency department after a syncopal or near syncopal episode at home.  Patient reports that he was in his usual state of health today when he went out to mow his lawn on a riding mower.  He had been mowing for more than an hour, but had not yet finished when he began to feel generally weak and parked the mower in his garage.  He felt too weak to get up and a neighbor noted that he had been sitting on his mower for roughly 20 minutes and went to check on him.  The neighbor reported that he was initially unresponsive, though the patient reports that he was just very weak but remembers the neighbor talking to them.  He slowly began to respond and reports feeling back to his usual self in the ED.  There was no seizure-like activity observed.  There had not been any fall or trauma.  He denies any chest pain, palpitations, shortness of breath, diaphoresis, or nausea.  He denies lightheadedness.  Denies melena or hematochezia.  No recent fevers or chills.   ED Course: Upon arrival to the ED, patient is found to be afebrile, saturating well on room air, and with vitals otherwise normal.  EKG features a sinus rhythm with nonspecific IVCD and LVH.  Chest x-ray is negative for acute cardia pulmonary disease.  Chemistry panel is notable for creatinine of 2.15, up from 1.40 a year ago.  CBC features a mild normocytic anemia and troponin is undetectable.  BNP is elevated at 443.  Patient has remained hemodynamically stable in the ED.  Pacer will be interrogated and he will be observed on the telemetry  unit for ongoing evaluation and management of syncope or near syncope.  Review of Systems:  All other systems reviewed and apart from HPI, are negative.  Past Medical History:  Diagnosis Date  . Aortic stenosis    mild AS 11/24/16 (peak grad 24, mean grad 11) Dr. [name]  . CHB (complete heart block) ([place]) 07/2017  . Diabetes mellitus without complication ([place])   . Gout   . Hypertension   . Peripheral arterial disease ([place])   . 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 Second degree AV block    Wenckebach; no indication for pacemaker as of 12/01/16 (Dr. [name])    Past Surgical History:  Procedure Laterality Date  . CARDIOVASCULAR STRESS TEST     11/21/16 Low risk study, EF 52% [facility])  . EYE SURGERY    . KYPHOPLASTY N/A 02/15/2017   Procedure: LUMBAR FOUR KYPHOPLASTY;  Surgeon: Phylliss Bob, MD;  Location: [place];  Service: Orthopedics;  Laterality: N/A;  . lipoma surgery     neck - 30 years ago  . PACEMAKER IMPLANT N/A 07/11/2017   Procedure: Pacemaker Implant;  Surgeon: Constance Haw, MD;  Location: [place] CV LAB;  Service: Cardiovascular;  Laterality: N/A;  . TRANSTHORACIC ECHOCARDIOGRAM     11/24/16 [facility] CV): EF 55-60%, mild AS, mild-mod MR, mod TR, moderate pulm HTN, PAP 49 mmHg     reports that he has quit smoking. He has never  used smokeless tobacco. He reports that he does not drink alcohol or use drugs.  No Known Allergies  Family History  Problem Relation Age of Onset  . Diabetes Mother   . Breast cancer Mother   . Hypertension Father   . Diabetes Sister   . Diabetes Brother   . Diabetes Maternal Grandmother   . Diabetes Brother   . Diabetes Brother   . Diabetes Brother   . Diabetes Sister   . Diabetes Sister   . Diabetes Sister      Prior to Admission medications   Medication Sig Start Date End Date Taking? Authorizing Provider  allopurinol (ZYLOPRIM) 100 MG tablet Take 100 mg by mouth daily.    Yes [provider]  amLODipine (NORVASC) 5 MG tablet Take 5 mg by mouth at bedtime.    Yes [provider]  Ascorbic Acid (VITAMIN C) 1000 MG tablet Take 1,000 mg by mouth daily.   Yes [provider]  aspirin 81 MG tablet Take 81 mg by mouth 3 (three) times a week.    Yes [provider]  carvedilol (COREG) 12.5 MG tablet Take 1 tablet (12.5 mg total) by mouth 2 (two) times daily. 05/02/18 07/31/18 Yes Camnitz, Will Hassell Done, MD  Cholecalciferol (VITAMIN D3) 5000 units CAPS Take 5,000 Units by mouth every evening.   Yes [provider]  colchicine 0.6 MG tablet Take 0.6 mg by mouth daily as needed (for gout flares).   Yes [provider]  hydroxypropyl methylcellulose / hypromellose (ISOPTO TEARS / GONIOVISC) 2.5 % ophthalmic solution Place 1 drop into both eyes daily as needed for dry eyes.   Yes [provider]  meloxicam (MOBIC) 7.5 MG tablet Take 7.5 mg by mouth 2 (two) times daily.   Yes [provider]  metFORMIN (GLUCOPHAGE) 1000 MG tablet Take 1,000 mg by mouth 2 (two) times daily with a meal.   Yes [provider]  pioglitazone (ACTOS) 45 MG tablet Take 45 mg by mouth every evening.    Yes [provider]  pravastatin (PRAVACHOL) 20 MG tablet Take 20 mg by mouth every evening.   Yes [provider]  sitaGLIPtin (JANUVIA) 100 MG tablet Take 100 mg by mouth daily.   Yes [provider]  valsartan (DIOVAN) 320 MG tablet Take 320 mg by mouth daily.   Yes [provider]    Physical Exam: Vitals:   06/13/18 2000 06/13/18 2015 06/13/18 2045 06/13/18 2115  BP: (!) 174/74 (!) 160/64 (!) 163/66 (!) 161/71  Pulse: 64 66 66 64  Resp: (!) 21 (!) 23 (!) 21 20  Temp:      TempSrc:      SpO2: 99% 99% 98% 100%  Weight:      Height:          Constitutional: NAD, calm  Eyes: PERTLA, lids and conjunctivae normal ENMT: Mucous membranes are moist. Posterior pharynx clear of any exudate or lesions.    Neck: normal, supple, no masses, no thyromegaly Respiratory: clear to auscultation bilaterally, no wheezing, no crackles. Normal respiratory effort.   Cardiovascular: S1 & S2 heard, regular rate and rhythm. No extremity edema. 2+ pedal pulses.   Abdomen: No distension, no tenderness, soft. Bowel sounds normal.  Musculoskeletal: no clubbing / cyanosis. No joint deformity upper and lower extremities. Normal muscle tone.  Skin: no significant rashes, lesions, ulcers. Warm, dry, well-perfused. Neurologic: CN 2-12 grossly intact. Sensation intact. Strength 5/5 in all 4 limbs.  Psychiatric: Alert and  oriented x 3. Calm and cooperative.     Labs on Admission: I have personally reviewed following labs and imaging studies  CBC: Recent Labs  Lab 06/13/18 1554  WBC 6.0  NEUTROABS 4.0  HGB 11.2*  HCT 36.8*  MCV 92.5  PLT 124   Basic Metabolic Panel: Recent Labs  Lab 06/13/18 1554  NA 142  K 5.1  CL 109  CO2 25  GLUCOSE 181*  BUN 24*  CREATININE 2.15*  CALCIUM 9.6   GFR: Estimated Creatinine Clearance: 34.7 mL/min (A) (by C-G formula based on SCr of 2.15 mg/dL (H)). Liver Function Tests: Recent Labs  Lab 06/13/18 1554  AST 24  ALT 16  ALKPHOS 79  BILITOT 0.6  PROT 6.6  ALBUMIN 3.5   No results for input(s): LIPASE, AMYLASE in the last 168 hours. No results for input(s): AMMONIA in the last 168 hours. Coagulation Profile: No results for input(s): INR, PROTIME in the last 168 hours. Cardiac Enzymes: Recent Labs  Lab 06/13/18 1554  TROPONINI <0.03   BNP (last 3 results) No results for input(s): PROBNP in the last 8760 hours. HbA1C: No results for input(s): HGBA1C in the last 72 hours. CBG: No results for input(s): GLUCAP in the last 168 hours. Lipid Profile: No results for input(s): CHOL, HDL, LDLCALC, TRIG, CHOLHDL, LDLDIRECT in the last 72 hours. Thyroid Function Tests: No results for input(s): TSH, T4TOTAL, FREET4, T3FREE, THYROIDAB in the last 72  hours. Anemia Panel: No results for input(s): VITAMINB12, FOLATE, FERRITIN, TIBC, IRON, RETICCTPCT in the last 72 hours. Urine analysis:    Component Value Date/Time   COLORURINE YELLOW 06/13/2018 1750   APPEARANCEUR HAZY (A) 06/13/2018 1750   LABSPEC 1.018 06/13/2018 1750   PHURINE 7.0 06/13/2018 1750   GLUCOSEU 50 (A) 06/13/2018 1750   HGBUR NEGATIVE 06/13/2018 1750   BILIRUBINUR NEGATIVE 06/13/2018 1750   KETONESUR NEGATIVE 06/13/2018 1750   PROTEINUR 30 (A) 06/13/2018 1750   NITRITE NEGATIVE 06/13/2018 1750   LEUKOCYTESUR NEGATIVE 06/13/2018 1750   Sepsis Labs: @LABRCNTIP (procalcitonin:4,lacticidven:4) )No results found for this or any previous visit (from the past 240 hour(s)).   Radiological Exams on Admission: Dg Chest 2 View  Result Date: 06/13/2018 CLINICAL DATA:  Unresponsive.  Pacer.  Ex-smoker. EXAM: CHEST - 2 VIEW COMPARISON:  07/12/2017 FINDINGS: Pacer with leads at right atrium and right ventricle. No lead discontinuity. Mild right hemidiaphragm eventration anteriorly. Midline trachea. Normal heart size and mediastinal contours. Atherosclerosis in the transverse aorta. No pleural effusion or pneumothorax. There may be a calcified granuloma projecting over the left lung apex, similar. IMPRESSION: No acute cardiopulmonary disease. Aortic Atherosclerosis (ICD10-I70.0). Electronically Signed   By: Abigail Miyamoto M.D.   On: 06/13/2018 17:34    EKG: Independently reviewed. Sinus rhythm, non-specific IVCD, LVH.   Assessment/Plan   1. Syncope  - Presents after an apparent syncopal or presyncopal episode  - He felt generally weak prior to the episode, but denies chest pain, palpitations, nausea, or diaphoresis  - Interrogate pacer, check orthostatics, continue cardiac monitoring, update echocardiogram, provide gentle IVF hydration overnight    2. Heart block with pacer  - Patient has hx of complete heart block status-post pacer placement  - Coreg recently increased after  NSVT noted  - Pacer is being interrogated in ED, will continue cardiac monitoring, continue Coreg   3. Type II DM  - A1c was 7.4% last year  - Managed at home with metformin, Actos, and Januvia  - Follow CBG's and use a low-intensity SSI  with Novolog while in hospital    4. Renal insufficiency  - SCr is 2.15 on admission, up from 1.40 one year ago  - May have an AKI vs progression of CKD  - Check urine chemistries, hold valsartan and Mobic, start gentle IVF hydration, renally-dose medications, repeat chem panel in am    5. Hypertension  - BP at goal  - Continue Coreg and Norvasc, hold valsartan in light of increased creatinine     DVT prophylaxis: sq heparin  Code Status: Full  Family Communication: Wife updated at bedside Consults called: None Admission status: Observation     Vianne Bulls, MD [facility] Pager [phone]  If 7PM-7AM, please contact night-coverage [email or web address] [facility]  06/13/2018, 9:32 PM

## 2018-06-13 NOTE — ED Triage Notes (Signed)
Pt arrived via [facility] from home c/o syncopal episode.  Pt states he was on the lawn mower about 2.5 hours and pulled it into his garage, neighbors noticed he hadn't moved in a while sitting on the mower still and went to check on him to find him unresponsive. Pt arrives A&Ox4.  EMS gave 1L NS.

## 2018-06-13 NOTE — ED Notes (Signed)
Interrogate [employer] device

## 2018-06-13 NOTE — ED Notes (Signed)
Wife [phone] [name]

## 2018-06-13 NOTE — ED Notes (Signed)
Pacemaker interrogated. 

## 2018-06-14 ENCOUNTER — Ambulatory Visit (HOSPITAL_BASED_OUTPATIENT_CLINIC_OR_DEPARTMENT_OTHER): Payer: Medicare HMO

## 2018-06-14 ENCOUNTER — Encounter (HOSPITAL_COMMUNITY): Payer: Self-pay

## 2018-06-14 ENCOUNTER — Other Ambulatory Visit: Payer: Self-pay

## 2018-06-14 DIAGNOSIS — I340 Nonrheumatic mitral (valve) insufficiency: Secondary | ICD-10-CM | POA: Diagnosis not present

## 2018-06-14 DIAGNOSIS — R55 Syncope and collapse: Secondary | ICD-10-CM

## 2018-06-14 LAB — BASIC METABOLIC PANEL
ANION GAP: 8 (ref 5–15)
BUN: 28 mg/dL — ABNORMAL HIGH (ref 8–23)
CHLORIDE: 109 mmol/L (ref 98–111)
CO2: 24 mmol/L (ref 22–32)
Calcium: 9 mg/dL (ref 8.9–10.3)
Creatinine, Ser: 1.75 mg/dL — ABNORMAL HIGH (ref 0.61–1.24)
GFR calc non Af Amer: 36 mL/min — ABNORMAL LOW (ref >60)
GFR, EST AFRICAN AMERICAN: 42 mL/min — AB (ref >60)
GLUCOSE: 94 mg/dL (ref 70–99)
Potassium: 4.1 mmol/L (ref 3.5–5.1)
Sodium: 141 mmol/L (ref 135–145)

## 2018-06-14 LAB — CBC
HEMATOCRIT: 32 % — AB (ref 39.0–52.0)
HEMOGLOBIN: 9.9 g/dL — AB (ref 13.0–17.0)
MCH: 28.1 pg (ref 26.0–34.0)
MCHC: 30.9 g/dL (ref 30.0–36.0)
MCV: 90.9 fL (ref 78.0–100.0)
Platelets: 148 10*3/uL — ABNORMAL LOW (ref 150–400)
RBC: 3.52 MIL/uL — AB (ref 4.22–5.81)
RDW: 13.5 % (ref 11.5–15.5)
WBC: 4.7 10*3/uL (ref 4.0–10.5)

## 2018-06-14 LAB — GLUCOSE, CAPILLARY
Glucose-Capillary: 105 mg/dL — ABNORMAL HIGH (ref 70–99)
Glucose-Capillary: 151 mg/dL — ABNORMAL HIGH (ref 70–99)
Glucose-Capillary: 128 mg/dL — ABNORMAL HIGH (ref 70–99)

## 2018-06-14 LAB — ECHOCARDIOGRAM COMPLETE
HEIGHTINCHES: 71 in
WEIGHTICAEL: 3424 [oz_av]

## 2018-06-14 MED ORDER — VALSARTAN 320 MG PO TABS: 320.0000 mg | ORAL_TABLET | Freq: Every day | ORAL | Status: DC

## 2018-06-14 MED ORDER — POLYVINYL ALCOHOL 1.4 % OP SOLN
1.0000 [drp] | Freq: Every day | OPHTHALMIC | Status: DC | PRN
  Filled 2018-06-14: qty 15

## 2018-06-14 NOTE — Plan of Care (Signed)
Pt up in chair resting comfortably

## 2018-06-14 NOTE — Progress Notes (Signed)
Nsg Discharge Note  Admit Date:  06/13/2018 Discharge date: 06/14/2018   Dylan [name]. to be D/C'd Home per MD order.  AVS completed.  Copy for chart, and copy for patient signed, and dated. Patient/caregiver able to verbalize understanding.  Discharge Medication: Allergies as of 06/14/2018   No Known Allergies     Medication List    STOP taking these medications   meloxicam 7.5 MG tablet Commonly known as:  MOBIC     TAKE these medications   allopurinol 100 MG tablet Commonly known as:  ZYLOPRIM Take 100 mg by mouth daily.   amLODipine 5 MG tablet Commonly known as:  NORVASC Take 5 mg by mouth at bedtime.   aspirin 81 MG tablet Take 81 mg by mouth 3 (three) times a week. Notes to patient:  Take aspirin by mouth on Monday, Wednesday, and Friday   carvedilol 12.5 MG tablet Commonly known as:  COREG Take 1 tablet (12.5 mg total) by mouth 2 (two) times daily.   colchicine 0.6 MG tablet Take 0.6 mg by mouth daily as needed (for gout flares). Notes to patient:  As needed   hydroxypropyl methylcellulose / hypromellose 2.5 % ophthalmic solution Commonly known as:  ISOPTO TEARS / GONIOVISC Place 1 drop into both eyes daily as needed for dry eyes. Notes to patient:  As needed   metFORMIN 1000 MG tablet Commonly known as:  GLUCOPHAGE Take 1,000 mg by mouth 2 (two) times daily with a meal.   pioglitazone 45 MG tablet Commonly known as:  ACTOS Take 45 mg by mouth every evening.   pravastatin 20 MG tablet Commonly known as:  PRAVACHOL Take 20 mg by mouth every evening.   sitaGLIPtin 100 MG tablet Commonly known as:  JANUVIA Take 100 mg by mouth daily.   valsartan 320 MG tablet Commonly known as:  DIOVAN Take 1 tablet (320 mg total) by mouth daily. Hold for 5 days, then resume What changed:  additional instructions Notes to patient:  Hold for 5 days then resume   vitamin C 1000 MG tablet Take 1,000 mg by mouth daily.   Vitamin D3 5000 units Caps Take 5,000  Units by mouth every evening.       Discharge Assessment: Vitals:   06/14/18 0900 06/14/18 0903  BP: (!) 142/55 (!) 142/55  Pulse: 61 (!) 59  Resp:    Temp:    SpO2:  100%   Skin clean, dry and intact without evidence of skin break down, no evidence of skin tears noted. IV catheter discontinued intact. Site without signs and symptoms of complications - no redness or edema noted at insertion site, patient denies c/o pain - only slight tenderness at site.  Dressing with slight pressure applied.  D/c Instructions-Education: Discharge instructions given to patient/family with verbalized understanding. D/c education completed with patient/family including follow up instructions, medication list, d/c activities limitations if indicated, with other d/c instructions as indicated by MD - patient able to verbalize understanding, all questions fully answered. Patient instructed to return to ED, call 911, or call MD for any changes in condition.  Patient escorted via [place], and D/C home via private auto.  Cristela Blue, RN 06/14/2018 6:54 PM

## 2018-06-14 NOTE — Progress Notes (Signed)
   06/13/18 2249  Vitals  Temp 98.2 F (36.8 C)  Temp Source Oral  BP 137/63  MAP (mmHg) 86  BP Location Right Arm  BP Method Automatic  Patient Position (if appropriate) Lying  Pulse Rate 79  Pulse Rate Source Monitor  Resp 20  Oxygen Therapy  SpO2 99 %   Pt received to Rm 10 through ED. VSS and pt with no acute distress upon arrival. pt was oriented to unit. Skin assessed with [facility] RN. Pt with scattered scaly skin & abrasion otherwise intact skin

## 2018-06-14 NOTE — Plan of Care (Signed)
Pt up to chair. Resting comfortably eyes open. No apparent distress noted. Male relative at bedside.

## 2018-06-14 NOTE — Discharge Summary (Signed)
Dylan [name]., is a 77 y.o. male  DOB 04-08-1941  MRN [id number].  Admission date:  06/13/2018  Admitting Physician  Vianne Bulls, MD  Discharge Date:  06/14/2018   Primary MD  [name], MD  Recommendations for primary care physician for things to follow:  -Please check CBC, BMP during next visit to ensure stable renal function   Admission Diagnosis  Syncope and collapse [R55]   Discharge Diagnosis  Syncope and collapse [R55]    Principal Problem:   Syncope Active Problems:   Peripheral arterial disease ([place])   Essential hypertension   Diabetes mellitus type II, non insulin dependent (HCC)   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HCC)   CKD (chronic kidney disease), stage III ([place])      Past Medical History:  Diagnosis Date  . Aortic stenosis    mild AS 11/24/16 (peak grad 24, mean grad 11) Dr. [name]  . CHB (complete heart block) ([place]) 07/2017  . Diabetes mellitus without complication ([place])   . Gout   . Hypertension   . Peripheral arterial disease ([place])   . 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 Second degree AV block    Wenckebach; no indication for pacemaker as of 12/01/16 (Dr. [name])    Past Surgical History:  Procedure Laterality Date  . CARDIOVASCULAR STRESS TEST     11/21/16 Low risk study, EF 52% [facility])  . EYE SURGERY    . KYPHOPLASTY N/A 02/15/2017   Procedure: LUMBAR FOUR KYPHOPLASTY;  Surgeon: Phylliss Bob, MD;  Location: [place];  Service: Orthopedics;  Laterality: N/A;  . lipoma surgery     neck - 30 years ago  . PACEMAKER IMPLANT N/A 07/11/2017   Procedure: Pacemaker Implant;  Surgeon: Constance Haw, MD;  Location: [place] [facility];  Service: Cardiovascular;  Laterality: N/A;  . TRANSTHORACIC ECHOCARDIOGRAM     11/24/16 [facility]): EF 55-60%, mild AS, mild-mod MR, mod TR, moderate pulm HTN, PAP 49 mmHg       History of present  illness and  Hospital Course:     Kindly see H&P for history of present illness and admission details, please review complete Labs, Consult reports and Test reports for all details in brief  HPI  from the history and physical done on the day of admission 06/13/2018  HPI: Dylan Schnorr. is a 77 y.o. male with medical history significant for chronic kidney disease, hypertension, complete heart block with pacer, type 2 diabetes mellitus, and peripheral arterial disease, now presenting to the emergency department after a syncopal or near syncopal episode at home.  Patient reports that he was in his usual state of health today when he went out to mow his lawn on a riding mower.  He had been mowing for more than an hour, but had not yet finished when he began to feel generally weak and parked the mower in his garage.  He felt too weak to get up and a neighbor noted that he had been sitting on his  mower for roughly 20 minutes and went to check on him.  The neighbor reported that he was initially unresponsive, though the patient reports that he was just very weak but remembers the neighbor talking to them.  He slowly began to respond and reports feeling back to his usual self in the ED.  There was no seizure-like activity observed.  There had not been any fall or trauma.  He denies any chest pain, palpitations, shortness of breath, diaphoresis, or nausea.  He denies lightheadedness.  Denies melena or hematochezia.  No recent fevers or chills.   ED Course: Upon arrival to the ED, patient is found to be afebrile, saturating well on room air, and with vitals otherwise normal.  EKG features a sinus rhythm with nonspecific IVCD and LVH.  Chest x-ray is negative for acute cardia pulmonary disease.  Chemistry panel is notable for creatinine of 2.15, up from 1.40 a year ago.  CBC features a mild normocytic anemia and troponin is undetectable.  BNP is elevated at 443.  Patient has remained hemodynamically stable in the  ED.  Pacer will be interrogated and he will be observed on the telemetry unit for ongoing evaluation and management of syncope or near syncope.    Hospital Course    Near-syncope -Patient reports he has been doing his lawn in the midday, and hot weather, for about 2 hours, reports he does not have any fluid intake for about 4 hours, reports he felt generally weak and tired after he finished mowing the loss of consciousness, denies any chest pain, palpitation, nausea or diaphoresis, pacemaker interrogated in ED with no findings to cause syncope, was admitted for further work-up, his rhythm was paced on telemetry, was obtained, preserved EF, with no regional wall motion abnormalities, he was hydrated with IV fluids overnight, he feels much better this morning, back to baseline.  Heart block with pacer  - Patient has hx of complete heart block status-post pacer placement  - Coreg recently increased after NSVT noted   Type II DM  - A1c was 7.4% last year  - Managed at home with metformin, Actos, and Januvia , resumed on discharge  AKI on CKD stage III -Baseline creatinine around 1.5, was elevated at 2.1 on admission, secondary to dehydration, he was hydrated overnight, creatinine 1.7 this morning, Mobic has been stopped, I have instructed him to hold valsartan for next 5 days then to resume after that, repeat BMP during next visit.   Hypertension  - BP at goal  - Continue Coreg and Norvasc, hold valsartan for next 5 days    Discharge Condition:  Stable, patient eager to go home   Follow UP  Follow-up Information    [name], MD Follow up in 1 week(s).   Specialty:  Internal Medicine Contact information: [address] [place] [address] [phone]        Adrian Prows, MD .   Specialty:  Cardiology Contact information: [place] [address] [phone]             Discharge Instructions  and  Discharge Medications      Discharge Instructions    Discharge instructions   Complete by:  As directed    Follow with Primary MD [name], MD in 7 days   Get CBC, CMP, 2 view Chest X ray checked  by Primary MD next visit.    Activity: As tolerated with Full fall precautions use walker/cane & assistance as needed  Disposition Home    Diet: Heart Healthy  , carbohydrate modified with feeding assistance and aspiration precautions.  For Heart failure patients - Check your Weight same time everyday, if you gain over 2 pounds, or you develop in leg swelling, experience more shortness of breath or chest pain, call your Primary MD immediately. Follow Cardiac Low Salt Diet and 1.5 lit/day fluid restriction.   On your next visit with your primary care physician please Get Medicines reviewed and adjusted.   Please request your Prim.MD to go over all Hospital Tests and Procedure/Radiological results at the follow up, please get all Hospital records sent to your Prim MD by signing hospital release before you go home.   If you experience worsening of your admission symptoms, develop shortness of breath, life threatening emergency, suicidal or homicidal thoughts you must seek medical attention immediately by calling 911 or calling your MD immediately  if symptoms less severe.  You Must read complete instructions/literature along with all the possible adverse reactions/side effects for all the Medicines you take and that have been prescribed to you. Take any new Medicines after you have completely understood and accpet all the possible adverse reactions/side effects.   Do not drive, operating heavy machinery, perform activities at heights, swimming or participation in water activities or provide baby sitting services if your were admitted for syncope or siezures until you have seen by Primary MD or a Neurologist and advised to do so again.  Do not drive when taking Pain medications.    Do not take more than  prescribed Pain, Sleep and Anxiety Medications  Special Instructions: If you have smoked or chewed Tobacco  in the last 2 yrs please stop smoking, stop any regular Alcohol  and or any Recreational drug use.  Wear Seat belts while driving.   Please note  You were cared for by a hospitalist during your hospital stay. If you have any questions about your discharge medications or the care you received while you were in the hospital after you are discharged, you can call the unit and asked to speak with the hospitalist on call if the hospitalist that took care of you is not available. Once you are discharged, your primary care physician will handle any further medical issues. Please note that NO REFILLS for any discharge medications will be authorized once you are discharged, as it is imperative that you return to your primary care physician (or establish a relationship with a primary care physician if you do not have one) for your aftercare needs so that they can reassess your need for medications and monitor your lab values.   Increase activity slowly   Complete by:  As directed      Allergies as of 06/14/2018   No Known Allergies     Medication List    STOP taking these medications   meloxicam 7.5 MG tablet Commonly known as:  MOBIC     TAKE these medications   allopurinol 100 MG tablet Commonly known as:  ZYLOPRIM Take 100 mg by mouth daily.   amLODipine 5 MG tablet Commonly known as:  NORVASC Take 5 mg by mouth at bedtime.   aspirin 81 MG tablet Take 81 mg by mouth 3 (three) times a week.   carvedilol 12.5 MG tablet Commonly known as:  COREG Take 1 tablet (12.5 mg total) by mouth 2 (two) times daily.   colchicine 0.6 MG tablet Take 0.6 mg by mouth daily as needed (for gout flares).   hydroxypropyl  methylcellulose / hypromellose 2.5 % ophthalmic solution Commonly known as:  ISOPTO TEARS / GONIOVISC Place 1 drop into both eyes daily as needed for dry eyes.   metFORMIN  1000 MG tablet Commonly known as:  GLUCOPHAGE Take 1,000 mg by mouth 2 (two) times daily with a meal.   pioglitazone 45 MG tablet Commonly known as:  ACTOS Take 45 mg by mouth every evening.   pravastatin 20 MG tablet Commonly known as:  PRAVACHOL Take 20 mg by mouth every evening.   sitaGLIPtin 100 MG tablet Commonly known as:  JANUVIA Take 100 mg by mouth daily.   valsartan 320 MG tablet Commonly known as:  DIOVAN Take 1 tablet (320 mg total) by mouth daily. Hold for 5 days, then resume What changed:  additional instructions   vitamin C 1000 MG tablet Take 1,000 mg by mouth daily.   Vitamin D3 5000 units Caps Take 5,000 Units by mouth every evening.         Diet and Activity recommendation: See Discharge Instructions above   Consults obtained -  None   Major procedures and Radiology Reports - PLEASE review detailed and final reports for all details, in brief -      Dg Chest 2 View  Result Date: 06/13/2018 CLINICAL DATA:  Unresponsive.  Pacer.  Ex-smoker. EXAM: CHEST - 2 VIEW COMPARISON:  07/12/2017 FINDINGS: Pacer with leads at right atrium and right ventricle. No lead discontinuity. Mild right hemidiaphragm eventration anteriorly. Midline trachea. Normal heart size and mediastinal contours. Atherosclerosis in the transverse aorta. No pleural effusion or pneumothorax. There may be a calcified granuloma projecting over the left lung apex, similar. IMPRESSION: No acute cardiopulmonary disease. Aortic Atherosclerosis (ICD10-I70.0). Electronically Signed   By: Abigail Miyamoto M.D.   On: 06/13/2018 17:34    Micro Results     No results found for this or any previous visit (from the past 240 hour(s)).     Today   Subjective:   Dylan [name] today has no headache,no chest abdominal pain,no new weakness tingling or numbness, feels much better wants to go home today.   Objective:   Blood pressure (!) 142/55, pulse (!) 59, temperature 98 F (36.7 C),  temperature source Oral, resp. rate 20, height 5\' 11"  (1.803 m), weight 97.1 kg, SpO2 100 %.   Intake/Output Summary (Last 24 hours) at 06/14/2018 1405 Last data filed at 06/14/2018 0600 Gross per 24 hour  Intake 633.8 ml  Output -  Net 633.8 ml    Exam Awake Alert, Oriented x 3, No new F.N deficits, Normal affect Symmetrical Chest wall movement, Good air movement bilaterally, CTAB RRR,No Gallops,Rubs or new Murmurs, No Parasternal Heave +ve B.Sounds, Abd Soft, Non tender,  No rebound -guarding or rigidity. No Cyanosis, Clubbing or edema, No new Rash or bruise  Data Review   CBC w Diff:  Lab Results  Component Value Date   WBC 4.7 06/14/2018   HGB 9.9 (L) 06/14/2018   HGB 11.0 (L) 06/27/2017   HCT 32.0 (L) 06/14/2018   HCT 34.8 (L) 06/27/2017   PLT 148 (L) 06/14/2018   PLT 176 06/27/2017   LYMPHOPCT 16 06/13/2018   MONOPCT 14 06/13/2018   EOSPCT 2 06/13/2018   BASOPCT 1 06/13/2018    CMP:  Lab Results  Component Value Date   NA 141 06/14/2018   NA 140 06/27/2017   K 4.1 06/14/2018   CL 109 06/14/2018   CO2 24 06/14/2018   BUN 28 (H) 06/14/2018   BUN  29 (H) 06/27/2017   CREATININE 1.75 (H) 06/14/2018   PROT 6.6 06/13/2018   ALBUMIN 3.5 06/13/2018   BILITOT 0.6 06/13/2018   ALKPHOS 79 06/13/2018   AST 24 06/13/2018   ALT 16 06/13/2018  .   Total Time in preparing paper work, data evaluation and todays exam - 62 minutes  Phillips Climes M.D on 06/14/2018 at 2:05 PM  [facility]   Office  [phone]

## 2018-06-14 NOTE — Evaluation (Signed)
Physical Therapy Evaluation Patient Details Name: Dylan [name]. MRN: [id number] DOB: 1940-11-13 Today's Date: 06/14/2018   History of Present Illness  Jaece Ducharme is a 77 y.o M who was brought to the ED after being unable to get up from his lawnmower; onset of weakness and possible syncope. PMH includes PAD, HTN, T2DM, Complete AV Block + Pacemaker, CKD-III, Gout, an L4 Kyphoplasty, and Aortic Stenosis.   Clinical Impression  Pt presents with problems above and deficits below. Pt and wife educated on diet and generalized walking program and HEP for increased strength. Pt presents with some mild confusion. Pt performed bed mobility with Supervision, and sit<>stand transfers with Supervision-MinG and RW. Pt ambulated with/without RW and Supervision-MinG. Pt had increased unsteadiness without RW, so educated about use at home to increase safety. Will continue to follow acutely to support independence, mobility, and safety.     Follow Up Recommendations No PT follow up(pt refusing)    Equipment Recommendations  Rolling walker with 5" wheels    Recommendations for Other Services OT consult     Precautions / Restrictions Precautions Precautions: Fall Restrictions Weight Bearing Restrictions: No      Mobility  Bed Mobility Overal bed mobility: Needs Assistance Bed Mobility: Supine to Sit     Supine to sit: Supervision     General bed mobility comments: Pt was able to sit up in bed with Supervision, and reported no dizziness  Transfers Overall transfer level: Needs assistance Equipment used: Rolling walker (2 wheeled) Transfers: Sit to/from Stand Sit to Stand: Supervision;Min guard         General transfer comment: Pt performed sit<>stand transfers with RW and Supervision-MinG. Pt had no difficulty standing from an elevated surface. Pt had increased difficulty and and required push-up through arms to stand from recliner.   Ambulation/Gait Ambulation/Gait assistance:  Supervision;Min guard Gait Distance (Feet): 200 Feet Assistive device: Rolling walker (2 wheeled);None Gait Pattern/deviations: Step-through pattern;Wide base of support Gait velocity: Decreased   General Gait Details: Pt ambulated with RW and Supervision-MinG for safety. Pt had no visible LOB when ambulating with the RW and moved with good posture and a step-through pattern. Pt ambulated without a RW and with MinG. Pt had some unsteadiness without an AD and a wider base of support. Pt could not remember his room number, or how to get back to his room without help from PT. Pt had no SOB, but seemed more fatigued after ambulation.   Stairs            Wheelchair Mobility    Modified Rankin (Stroke Patients Only)       Balance Overall balance assessment: Needs assistance Sitting-balance support: No upper extremity supported;Feet supported Sitting balance-Leahy Scale: Good Sitting balance - Comments: Pt was able to scoot hips to EOB and raise arms overhead while PT put on gait belt   Standing balance support: No upper extremity supported;Bilateral upper extremity supported Standing balance-Leahy Scale: Fair Standing balance comment: Pt was able to ambulate with/without RW. Pt was able to stand in front of recliner without UE support, but had wide base of support and slight forward lean at trunk.                              Pertinent Vitals/Pain Pain Assessment: No/denies pain    Home Living Family/patient expects to be discharged to:: Private residence Living Arrangements: Spouse/significant other Available Help at Discharge: Family;Available 24 hours/day Type of Home:  House Home Access: Stairs to enter Entrance Stairs-Rails: Right Entrance Stairs-Number of Steps: 4 Home Layout: Two level;Able to live on main level with bedroom/bathroom Home Equipment: Kasandra Knudsen - single point      Prior Function Level of Independence: Independent;Independent with assistive  device(s)         Comments: Pt reports [place] to the airport, but walks independently. Pt reports having some difficulty getting dressed.      Hand Dominance   Dominant Hand: Right    Extremity/Trunk Assessment   Upper Extremity Assessment Upper Extremity Assessment: Defer to OT evaluation    Lower Extremity Assessment Lower Extremity Assessment: Generalized weakness    Cervical / Trunk Assessment Cervical / Trunk Assessment: Normal  Communication   Communication: No difficulties  Cognition Arousal/Alertness: Awake/alert Behavior During Therapy: WFL for tasks assessed/performed Overall Cognitive Status: Impaired/Different from baseline Area of Impairment: Orientation                 Orientation Level: Disoriented to;Time             General Comments: Pt could not tell what year it was. Pt was oriented to place and situation. Pt could not remember his room number, and after ambulation, could not tell PT how to get back to his room.       General Comments      Exercises     Assessment/Plan    PT Assessment Patient needs continued PT services  PT Problem List Decreased strength;Decreased activity tolerance;Decreased mobility;Decreased balance;Decreased knowledge of use of DME       PT Treatment Interventions DME instruction;Gait training;Stair training;Functional mobility training;Therapeutic activities;Therapeutic exercise;Balance training;Patient/family education    PT Goals (Current goals can be found in the Care Plan section)  Acute Rehab PT Goals Patient Stated Goal: To go home PT Goal Formulation: With patient Time For Goal Achievement: 06/28/18 Potential to Achieve Goals: Good    Frequency Min 3X/week   Barriers to discharge        Co-evaluation               AM-PAC PT "6 Clicks" Daily Activity  Outcome Measure Difficulty turning over in bed (including adjusting bedclothes, sheets and blankets)?: A  Little Difficulty moving from lying on back to sitting on the side of the bed? : A Little Difficulty sitting down on and standing up from a chair with arms (e.g., wheelchair, bedside commode, etc,.)?: Unable Help needed moving to and from a bed to chair (including a wheelchair)?: A Little Help needed walking in hospital room?: A Little Help needed climbing 3-5 steps with a railing? : A Little 6 Click Score: 16    End of Session Equipment Utilized During Treatment: Gait belt Activity Tolerance: Patient tolerated treatment well Patient left: in chair;with call bell/phone within reach;with chair alarm set;with family/visitor present Nurse Communication: Mobility status PT Visit Diagnosis: Unsteadiness on feet (R26.81);Other abnormalities of gait and mobility (R26.89);Muscle weakness (generalized) (M62.81);Difficulty in walking, not elsewhere classified (R26.2)    Time: 4193-7902 PT Time Calculation (min) (ACUTE ONLY): 23 min   Charges:   PT Evaluation $PT Eval Low Complexity: 1 Low PT Treatments $Gait Training: 8-22 mins   Elwin Mocha, S-DPT Acute Care Rehab Student [phone]     06/14/2018, 2:39 PM

## 2018-06-14 NOTE — Progress Notes (Signed)
  Echocardiogram 2D Echocardiogram has been performed.  06/14/2018, 10:33 AM

## 2018-06-14 NOTE — Discharge Instructions (Signed)
Near-Syncope Near-syncope is when you suddenly get weak or dizzy, or you feel like you might pass out (faint). During an episode of near-syncope, you may:  Feel dizzy or light-headed.  Feel sick to your stomach (nauseous).  See all white or all black.  Have cold, clammy skin.  If you passed out, get help right away.Call your local emergency services (911 in the [place] Do not drive yourself to the hospital. Follow these instructions at home: Pay attention to any changes in your symptoms. Take these actions to help with your condition:  Have someone stay with you until you feel stable.  Do not drive, use machinery, or play sports until your doctor says it is okay.  Keep all follow-up visits as told by your doctor. This is important.  If you start to feel like you might pass out, lie down right away and raise (elevate) your feet above the level of your heart. Breathe deeply and steadily. Wait until all of the symptoms are gone.  Drink enough fluid to keep your pee (urine) clear or pale yellow.  If you are taking blood pressure or heart medicine, get up slowly and spend many minutes getting ready to sit and then stand. This can help with dizziness.  Take over-the-counter and prescription medicines only as told by your doctor.  Get help right away if:  You have a very bad headache.  You have unusual pain in your chest, tummy, or back.  You are bleeding from your mouth or rectum.  You have black or tarry poop (stool).  You have a very fast or uneven heartbeat (palpitations).  You pass out one time or more than once.  You have jerky movements that you cannot control (seizure).  You are confused.  You have trouble walking.  You are very weak.  You have vision problems. These symptoms may be an emergency. Do not wait to see if the symptoms will go away. Get medical help right away. Call your local emergency services (911 in the [place] Do not drive yourself to the  hospital. This information is not intended to replace advice given to you by your health care provider. Make sure you discuss any questions you have with your health care provider. Document Released: 04/04/2008 Document Revised: 03/24/2016 Document Reviewed: 07/01/2015 Elsevier Interactive Patient Education  2017 [place] Follow with Primary MD [name], MD in 7 days   Get CBC, CMP, 2 view Chest X ray checked  by Primary MD next visit.    Activity: As tolerated with Full fall precautions use walker/cane & assistance as needed   Disposition Home    Diet: Heart Healthy  , carbohydrate modified with feeding assistance and aspiration precautions.  For Heart failure patients - Check your Weight same time everyday, if you gain over 2 pounds, or you develop in leg swelling, experience more shortness of breath or chest pain, call your Primary MD immediately. Follow Cardiac Low Salt Diet and 1.5 lit/day fluid restriction.   On your next visit with your primary care physician please Get Medicines reviewed and adjusted.   Please request your Prim.MD to go over all Hospital Tests and Procedure/Radiological results at the follow up, please get all Hospital records sent to your Prim MD by signing hospital release before you go home.   If you experience worsening of your admission symptoms, develop shortness of breath, life threatening emergency, suicidal or homicidal thoughts you must seek medical attention immediately by calling 911 or calling your MD  immediately  if symptoms less severe.  You Must read complete instructions/literature along with all the possible adverse reactions/side effects for all the Medicines you take and that have been prescribed to you. Take any new Medicines after you have completely understood and accpet all the possible adverse reactions/side effects.   Do not drive, operating heavy machinery, perform activities at heights, swimming or participation in water  activities or provide baby sitting services if your were admitted for syncope or siezures until you have seen by Primary MD or a Neurologist and advised to do so again.  Do not drive when taking Pain medications.    Do not take more than prescribed Pain, Sleep and Anxiety Medications  Special Instructions: If you have smoked or chewed Tobacco  in the last 2 yrs please stop smoking, stop any regular Alcohol  and or any Recreational drug use.  Wear Seat belts while driving.   Please note  You were cared for by a hospitalist during your hospital stay. If you have any questions about your discharge medications or the care you received while you were in the hospital after you are discharged, you can call the unit and asked to speak with the hospitalist on call if the hospitalist that took care of you is not available. Once you are discharged, your primary care physician will handle any further medical issues. Please note that NO REFILLS for any discharge medications will be authorized once you are discharged, as it is imperative that you return to your primary care physician (or establish a relationship with a primary care physician if you do not have one) for your aftercare needs so that they can reassess your need for medications and monitor your lab values.

## 2018-06-15 LAB — UREA NITROGEN, URINE: UREA NITROGEN UR: 558 mg/dL

## 2018-06-20 ENCOUNTER — Ambulatory Visit (INDEPENDENT_AMBULATORY_CARE_PROVIDER_SITE_OTHER): Payer: Medicare HMO | Admitting: *Deleted

## 2018-06-20 DIAGNOSIS — I442 Atrioventricular block, complete: Secondary | ICD-10-CM

## 2018-06-20 NOTE — Progress Notes (Signed)
Remote pacemaker transmission.   

## 2018-06-22 ENCOUNTER — Encounter: Payer: Self-pay | Admitting: Cardiology

## 2018-06-22 DIAGNOSIS — R55 Syncope and collapse: Secondary | ICD-10-CM | POA: Diagnosis not present

## 2018-06-22 DIAGNOSIS — Z09 Encounter for follow-up examination after completed treatment for conditions other than malignant neoplasm: Secondary | ICD-10-CM | POA: Diagnosis not present

## 2018-06-22 DIAGNOSIS — N182 Chronic kidney disease, stage 2 (mild): Secondary | ICD-10-CM

## 2018-06-22 DIAGNOSIS — N179 Acute kidney failure, unspecified: Secondary | ICD-10-CM | POA: Diagnosis not present

## 2018-06-22 DIAGNOSIS — E1122 Type 2 diabetes mellitus with diabetic chronic kidney disease: Secondary | ICD-10-CM | POA: Diagnosis not present

## 2018-06-22 DIAGNOSIS — E860 Dehydration: Secondary | ICD-10-CM | POA: Diagnosis not present

## 2018-06-22 DIAGNOSIS — I1310 Hypertensive heart and chronic kidney disease without heart failure, with stage 1 through stage 4 chronic kidney disease, or unspecified chronic kidney disease: Secondary | ICD-10-CM

## 2018-06-22 DIAGNOSIS — M839 Adult osteomalacia, unspecified: Secondary | ICD-10-CM | POA: Diagnosis not present

## 2018-06-22 HISTORY — DX: Chronic kidney disease, stage 2 (mild): N18.2

## 2018-06-22 HISTORY — DX: Hypertensive heart and chronic kidney disease without heart failure, with stage 1 through stage 4 chronic kidney disease, or unspecified chronic kidney disease: I13.10

## 2018-06-22 LAB — HEMOGLOBIN A1C: Hgb A1c MFr Bld: 7.6 — AB (ref 4.0–6.0)

## 2018-06-22 LAB — HEPATIC FUNCTION PANEL
ALK PHOS: 97 (ref 25–125)
ALT: 14 (ref 10–40)
AST: 23 (ref 14–40)

## 2018-06-22 LAB — BASIC METABOLIC PANEL
CREATININE: 1.4 — AB (ref 0.6–1.3)
Glucose: 166
SODIUM: 140 (ref 137–147)

## 2018-06-29 ENCOUNTER — Ambulatory Visit
Admission: RE | Admit: 2018-06-29 | Discharge: 2018-06-29 | Disposition: A | Discharge status: Home / Self Care | Payer: Medicare HMO | Source: Ambulatory Visit | Attending: Internal Medicine | Admitting: Internal Medicine

## 2018-06-29 ENCOUNTER — Other Ambulatory Visit: Payer: Self-pay | Admitting: Internal Medicine

## 2018-06-29 DIAGNOSIS — J8410 Pulmonary fibrosis, unspecified: Secondary | ICD-10-CM

## 2018-07-11 DIAGNOSIS — H4321 Crystalline deposits in vitreous body, right eye: Secondary | ICD-10-CM | POA: Diagnosis not present

## 2018-07-11 DIAGNOSIS — E113313 Type 2 diabetes mellitus with moderate nonproliferative diabetic retinopathy with macular edema, bilateral: Secondary | ICD-10-CM | POA: Diagnosis not present

## 2018-07-11 DIAGNOSIS — H43391 Other vitreous opacities, right eye: Secondary | ICD-10-CM | POA: Diagnosis not present

## 2018-07-14 ENCOUNTER — Encounter: Payer: Self-pay | Admitting: Internal Medicine

## 2018-07-14 DIAGNOSIS — I700 Atherosclerosis of aorta: Secondary | ICD-10-CM | POA: Insufficient documentation

## 2018-07-14 DIAGNOSIS — J984 Other disorders of lung: Secondary | ICD-10-CM

## 2018-07-14 DIAGNOSIS — N182 Chronic kidney disease, stage 2 (mild): Secondary | ICD-10-CM | POA: Insufficient documentation

## 2018-07-14 DIAGNOSIS — N179 Acute kidney failure, unspecified: Secondary | ICD-10-CM

## 2018-07-14 DIAGNOSIS — N289 Disorder of kidney and ureter, unspecified: Secondary | ICD-10-CM | POA: Insufficient documentation

## 2018-07-14 DIAGNOSIS — E860 Dehydration: Secondary | ICD-10-CM | POA: Insufficient documentation

## 2018-07-17 DIAGNOSIS — N183 Chronic kidney disease, stage 3 (moderate): Secondary | ICD-10-CM | POA: Diagnosis not present

## 2018-07-17 LAB — BASIC METABOLIC PANEL
BUN: 28 — AB (ref 4–21)
CREATININE: 1.7 — AB (ref <=1.3)
Glucose: 160
POTASSIUM: 4.9 (ref 3.4–5.3)
SODIUM: 142 (ref 137–147)

## 2018-07-20 ENCOUNTER — Encounter: Payer: Self-pay | Admitting: Internal Medicine

## 2018-07-20 DIAGNOSIS — I1310 Hypertensive heart and chronic kidney disease without heart failure, with stage 1 through stage 4 chronic kidney disease, or unspecified chronic kidney disease: Secondary | ICD-10-CM

## 2018-07-24 LAB — CUP PACEART REMOTE DEVICE CHECK
Battery Remaining Percentage: 95.5 %
Battery Voltage: 2.99 V
Brady Statistic AS VS Percent: 1 %
Brady Statistic RV Percent Paced: 99 %
Implantable Lead Implant Date: 20180911
Implantable Pulse Generator Implant Date: 20180911
Lead Channel Pacing Threshold Amplitude: 0.75 V
Lead Channel Pacing Threshold Pulse Width: 0.5 ms
Lead Channel Sensing Intrinsic Amplitude: 5 mV
Lead Channel Setting Pacing Amplitude: 2.5 V
Lead Channel Setting Sensing Sensitivity: 4 mV
MDC IDC LEAD IMPLANT DT: 20180911
MDC IDC LEAD LOCATION: 753859
MDC IDC LEAD LOCATION: 753860
MDC IDC MSMT BATTERY REMAINING LONGEVITY: 104 mo
MDC IDC MSMT LEADCHNL RA IMPEDANCE VALUE: 460 Ohm
MDC IDC MSMT LEADCHNL RA PACING THRESHOLD PULSEWIDTH: 0.5 ms
MDC IDC MSMT LEADCHNL RV IMPEDANCE VALUE: 460 Ohm
MDC IDC MSMT LEADCHNL RV PACING THRESHOLD AMPLITUDE: 0.5 V
MDC IDC MSMT LEADCHNL RV SENSING INTR AMPL: 4.8 mV
MDC IDC PG SERIAL: 8940191
MDC IDC SESS DTM: 20190821065132
MDC IDC SET LEADCHNL RA PACING AMPLITUDE: 2 V
MDC IDC SET LEADCHNL RV PACING PULSEWIDTH: 0.5 ms
MDC IDC STAT BRADY AP VP PERCENT: 12 %
MDC IDC STAT BRADY AP VS PERCENT: 1 %
MDC IDC STAT BRADY AS VP PERCENT: 88 %
MDC IDC STAT BRADY RA PERCENT PACED: 11 %
Pulse Gen Model: 2272

## 2018-08-20 ENCOUNTER — Other Ambulatory Visit: Payer: Self-pay | Admitting: Internal Medicine

## 2018-08-20 ENCOUNTER — Telehealth: Payer: Self-pay | Admitting: Internal Medicine

## 2018-08-20 NOTE — Telephone Encounter (Signed)
I spoke w/ [name] and rescheduled the patient's AWV from 10/11/18 to 10/04/18. VDM (DD)

## 2018-09-19 ENCOUNTER — Telehealth: Payer: Self-pay

## 2018-09-19 NOTE — Telephone Encounter (Signed)
Confirmed remote transmission w/ pt daughter.   

## 2018-09-20 ENCOUNTER — Encounter: Payer: Self-pay | Admitting: Cardiology

## 2018-09-24 ENCOUNTER — Encounter: Payer: Self-pay | Admitting: Internal Medicine

## 2018-09-24 ENCOUNTER — Ambulatory Visit (INDEPENDENT_AMBULATORY_CARE_PROVIDER_SITE_OTHER): Payer: Medicare HMO

## 2018-09-24 ENCOUNTER — Ambulatory Visit (INDEPENDENT_AMBULATORY_CARE_PROVIDER_SITE_OTHER): Payer: Medicare HMO | Admitting: Internal Medicine

## 2018-09-24 ENCOUNTER — Ambulatory Visit: Payer: Medicare HMO | Admitting: Internal Medicine

## 2018-09-24 VITALS — BP 118/74 | HR 60 | Temp 97.8°F | Ht 68.0 in | Wt 214.4 lb

## 2018-09-24 DIAGNOSIS — I1310 Hypertensive heart and chronic kidney disease without heart failure, with stage 1 through stage 4 chronic kidney disease, or unspecified chronic kidney disease: Secondary | ICD-10-CM

## 2018-09-24 DIAGNOSIS — N182 Chronic kidney disease, stage 2 (mild): Secondary | ICD-10-CM | POA: Diagnosis not present

## 2018-09-24 DIAGNOSIS — E1122 Type 2 diabetes mellitus with diabetic chronic kidney disease: Secondary | ICD-10-CM | POA: Diagnosis not present

## 2018-09-24 DIAGNOSIS — M48062 Spinal stenosis, lumbar region with neurogenic claudication: Secondary | ICD-10-CM

## 2018-09-24 DIAGNOSIS — Z23 Encounter for immunization: Secondary | ICD-10-CM

## 2018-09-24 DIAGNOSIS — I442 Atrioventricular block, complete: Secondary | ICD-10-CM

## 2018-09-24 DIAGNOSIS — I480 Paroxysmal atrial fibrillation: Secondary | ICD-10-CM

## 2018-09-24 NOTE — Progress Notes (Signed)
Remote pacemaker transmission.   

## 2018-09-25 ENCOUNTER — Ambulatory Visit: Payer: Medicare HMO | Admitting: Cardiology

## 2018-09-25 ENCOUNTER — Encounter: Payer: Self-pay | Admitting: Cardiology

## 2018-09-25 VITALS — BP 126/60 | HR 60 | Ht 68.0 in | Wt 216.0 lb

## 2018-09-25 DIAGNOSIS — H43391 Other vitreous opacities, right eye: Secondary | ICD-10-CM | POA: Diagnosis not present

## 2018-09-25 DIAGNOSIS — E785 Hyperlipidemia, unspecified: Secondary | ICD-10-CM

## 2018-09-25 DIAGNOSIS — I442 Atrioventricular block, complete: Secondary | ICD-10-CM

## 2018-09-25 DIAGNOSIS — I4891 Unspecified atrial fibrillation: Secondary | ICD-10-CM | POA: Diagnosis not present

## 2018-09-25 DIAGNOSIS — H4321 Crystalline deposits in vitreous body, right eye: Secondary | ICD-10-CM | POA: Diagnosis not present

## 2018-09-25 DIAGNOSIS — I10 Essential (primary) hypertension: Secondary | ICD-10-CM | POA: Diagnosis not present

## 2018-09-25 DIAGNOSIS — I472 Ventricular tachycardia, unspecified: Secondary | ICD-10-CM

## 2018-09-25 DIAGNOSIS — E113313 Type 2 diabetes mellitus with moderate nonproliferative diabetic retinopathy with macular edema, bilateral: Secondary | ICD-10-CM | POA: Diagnosis not present

## 2018-09-25 DIAGNOSIS — H26491 Other secondary cataract, right eye: Secondary | ICD-10-CM | POA: Diagnosis not present

## 2018-09-25 LAB — BMP8+EGFR
BUN/Creatinine Ratio: 19 (ref 10–24)
BUN: 30 mg/dL — ABNORMAL HIGH (ref 8–27)
CALCIUM: 9.7 mg/dL (ref 8.6–10.2)
CHLORIDE: 103 mmol/L (ref 96–106)
CO2: 23 mmol/L (ref 20–29)
Creatinine, Ser: 1.57 mg/dL — ABNORMAL HIGH (ref 0.76–1.27)
GFR calc Af Amer: 49 mL/min/{1.73_m2} — ABNORMAL LOW (ref >59)
GFR calc non Af Amer: 42 mL/min/{1.73_m2} — ABNORMAL LOW (ref >59)
GLUCOSE: 163 mg/dL — AB (ref 65–99)
POTASSIUM: 4.7 mmol/L (ref 3.5–5.2)
SODIUM: 140 mmol/L (ref 134–144)

## 2018-09-25 LAB — HEMOGLOBIN A1C
ESTIMATED AVERAGE GLUCOSE: 166 mg/dL
HEMOGLOBIN A1C: 7.4 % — AB (ref 4.8–5.6)

## 2018-09-25 LAB — LIPID PANEL
CHOLESTEROL TOTAL: 142 mg/dL (ref 100–199)
Chol/HDL Ratio: 2.9 ratio (ref 0.0–5.0)
HDL: 49 mg/dL (ref >39)
LDL Calculated: 77 mg/dL (ref 0–99)
TRIGLYCERIDES: 82 mg/dL (ref 0–149)
VLDL Cholesterol Cal: 16 mg/dL (ref 5–40)

## 2018-09-25 MED ORDER — CARVEDILOL 12.5 MG PO TABS: 12.5000 mg | ORAL_TABLET | Freq: Two times a day (BID) | ORAL | 3 refills | Status: DC

## 2018-09-25 NOTE — Patient Instructions (Signed)
Medication Instructions:  Your physician recommends that you continue on your current medications as directed. Please refer to the Current Medication list given to you today.  *If you need a refill on your cardiac medications before your next appointment, please call your pharmacy*  Labwork: None ordered  Testing/Procedures: None ordered  Follow-Up: Remote monitoring is used to monitor your Pacemaker or ICD from home. This monitoring reduces the number of office visits required to check your device to one time per year. It allows [place] to keep an eye on the functioning of your device to ensure it is working properly. You are scheduled for a device check from home on 12/24/2018. You may send your transmission at any time that day. If you have a wireless device, the transmission will be sent automatically. After your physician reviews your transmission, you will receive a postcard with your next transmission date.  Your physician wants you to follow-up in: 1 year with Dr. Curt Bears.  You will receive a reminder letter in the mail two months in advance. If you don't receive a letter, please call our office to schedule the follow-up appointment.  Thank you for choosing [facility]!!   Trinidad Curet, RN [phone]  Any Other Special Instructions Will Be Listed Below (If Applicable).

## 2018-09-25 NOTE — Progress Notes (Signed)
Electrophysiology Office Note   Date:  09/25/2018   ID:  Dylan [name]., DOB Feb 23, 1941, MRN [id number]  PCP:  Dylan Chard, MD  Cardiologist:  Dylan [name] Primary Electrophysiologist:  Dylan Ozawa Meredith Leeds, MD    No chief complaint on file.    History of Present Illness: Dylan [name]. is a 77 y.o. 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 Presenting today for electrophysiology evaluation. He has a history of hypertension, hyperlipidemia, and diabetes with PAD and diabetic retinopathy. He's been having dyspnea and fatigue.  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  He has Environmental health practitioner dual-chamber pacemaker implanted 07/11/17.  Today, denies symptoms of palpitations, chest pain, shortness of breath, orthopnea, PND, lower extremity edema, claudication, dizziness, presyncope, syncope, bleeding, or neurologic sequela. The patient is tolerating medications without difficulties.  Overall he is feeling well.  He has no major complaints.  Is able to do all his daily activities without restriction.   Past Medical History:  Diagnosis Date  . Aortic stenosis    mild AS 11/24/16 (peak grad 24, mean grad 11) Dr. Einar [name]  . CHB (complete heart block) ([place]) 07/2017  . 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 Diabetes mellitus without complication ([place])   . Gout   . Hypertension   . 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 Peripheral arterial disease ([place])   . 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 Second degree AV block    Wenckebach; no indication for pacemaker as of 12/01/16 (Dr. Einar [name])  . Vitamin B12 deficiency anemia 03/01/2018  . Vitamin D deficiency disease    Past Surgical History:  Procedure Laterality Date  . CARDIOVASCULAR STRESS TEST     11/21/16 Low risk study, EF 52% [facility])  . EYE SURGERY    . KYPHOPLASTY N/A 02/15/2017   Procedure: LUMBAR FOUR KYPHOPLASTY;   Surgeon: Dylan Bob, MD;  Location: [facility];  Service: Orthopedics;  Laterality: N/A;  . lipoma surgery     neck - 30 years ago  . PACEMAKER IMPLANT N/A 07/11/2017   Procedure: Pacemaker Implant;  Surgeon: Dylan Haw, MD;  Location: [facility];  Service: Cardiovascular;  Laterality: N/A;  . TRANSTHORACIC ECHOCARDIOGRAM     11/24/16 [facility]): EF 55-60%, mild AS, mild-mod MR, mod TR, moderate pulm HTN, PAP 49 mmHg     Current Outpatient Medications  Medication Sig Dispense Refill  . allopurinol (ZYLOPRIM) 100 MG tablet Take 100 mg by mouth daily.     [name] amLODipine (NORVASC) 5 MG tablet TAKE 1 TABLET BY MOUTH EVERY DAY 90 tablet 1  . Ascorbic Acid (VITAMIN C) 1000 MG tablet Take 1,000 mg by mouth daily.    [name] aspirin 81 MG tablet Take 81 mg by mouth 3 (three) times a week.     . carvedilol (COREG) 12.5 MG tablet Take 1 tablet (12.5 mg total) by mouth 2 (two) times daily. 180 tablet 3  . Cholecalciferol (VITAMIN D3) 5000 units CAPS Take 5,000 Units by mouth every evening.    . colchicine 0.6 MG tablet Take 0.6 mg by mouth daily as needed (for gout flares).    [name] glucose blood test strip 1 each by Other route as needed for other. Use as instructed    . Hypromellose (ISOPTO TEARS OP) Apply to eye.    . metFORMIN (GLUCOPHAGE) 1000 MG tablet Take 1,000 mg by mouth 2 (  two) times daily with a meal.    . pioglitazone (ACTOS) 45 MG tablet Take 45 mg by mouth every evening.     . pravastatin (PRAVACHOL) 20 MG tablet Take 20 mg by mouth 3 (three) times a week.     . sitaGLIPtin (JANUVIA) 100 MG tablet Take 100 mg by mouth daily.    . valsartan (DIOVAN) 320 MG tablet Take 1 tablet (320 mg total) by mouth daily. Hold for 5 days, then resume     No current facility-administered medications for this visit.     Allergies:   Patient has no known allergies.   Social History:  The patient  reports that he has quit smoking. He has never used smokeless tobacco. He reports that he does not  drink alcohol or use drugs.   Family History:  The patient's family history includes Arthritis in his mother; Breast cancer in his mother; Diabetes in his brother, brother, brother, brother, maternal grandmother, mother, sister, sister, sister, and sister; Hypertension in his father.   ROS:  Please see the history of present illness.   Otherwise, review of systems is positive for appetite change, chills, constipation, muscle pain, back pain, balance problems.   All other systems are reviewed and negative.   PHYSICAL EXAM: VS:  BP 126/60   Pulse 60   Ht 5\' 8"  (1.727 m)   Wt 216 lb (98 kg)   BMI 32.84 kg/m  , BMI Body mass index is 32.84 kg/m. GEN: Well nourished, well developed, in no acute distress  HEENT: normal  Neck: no JVD, carotid bruits, or masses Cardiac: RRR; no murmurs, rubs, or gallops,no edema  Respiratory:  clear to auscultation bilaterally, normal work of breathing GI: soft, nontender, nondistended, + BS MS: no deformity or atrophy  Skin: warm and dry, device site well healed Neuro:  Strength and sensation are intact Psych: euthymic mood, full affect  EKG:  EKG is not ordered today. Personal review of the ekg ordered 06/14/18 shows sinus rhythm, ventricular paced  Personal review of the device interrogation today. Results in [place]: 01/10/2018: TSH 1.35 06/13/2018: B Natriuretic Peptide 442.7 06/14/2018: Hemoglobin 9.9; Platelets 148 06/22/2018: ALT 14 09/24/2018: BUN 30; Creatinine, Ser 1.57; Potassium 4.7; Sodium 140    Lipid Panel     Component Value Date/Time   CHOL 142 09/24/2018 1202   TRIG 82 09/24/2018 1202   HDL 49 09/24/2018 1202   CHOLHDL 2.9 09/24/2018 1202   LDLCALC 77 09/24/2018 1202     Wt Readings from Last 3 Encounters:  09/25/18 216 lb (98 kg)  09/24/18 214 lb 6.4 oz (97.3 kg)  06/22/18 214 lb (97.1 kg)      Other studies Reviewed: Additional studies/ records that were reviewed today include: TTE 11/24/16 Review of the  above records today demonstrates:  Ejection fraction 55-60%, moderate LVH Left atrium mild to moderately dilated right atrium moderately dilated right ventricle moderately dilated normal right ventricular function Mild to moderate mitral regurgitation Moderate tricuspid regurgitation PA systolic pressure 49 mmHg   Myoview 11/21/16 Ejection fraction 50%, normal perfusion     ASSESSMENT AND PLAN:  1.  Complete heart block: Saint Jude dual-chamber pacemaker implanted 07/11/2017.  Functioning appropriately.  No changes.  2. Hypertension: Well-controlled today  3. Hyperlipidemia: Continue statin  4.  Paroxysmal atrial fibrillation: Monitor his device at previous checks.  He has had minimal atrial fibrillation since that time.  No changes.    5.  Ventricular tachycardia: Continues to have  short episodes of ventricular tachycardia without symptoms.  No changes.  Current medicines are reviewed at length with the patient today.   The patient does not have concerns regarding his medicines.  The following changes were made today: None  Labs/ tests ordered today include:  No orders of the defined types were placed in this encounter.    Disposition:   FU with Dabney Dever 12 months  Signed, Selinda Korzeniewski Meredith Leeds, MD  09/25/2018 12:12 PM     [place] [phone] (office) [phone] (fax)

## 2018-09-25 NOTE — Progress Notes (Signed)
Your a1c is 7.4, how do you feel on trulicity thus far? Remind him that he only administers the shot on Mondays. Your kidney fxn is stable. Be sure to stay well hydrated. Your chol looks fine. Continue with meds.

## 2018-09-28 ENCOUNTER — Encounter: Payer: Self-pay | Admitting: Internal Medicine

## 2018-09-30 NOTE — Progress Notes (Signed)
Subjective:     Patient ID: Dylan [name]. , male    DOB: [birth year] , 77 y.o.   MRN: [id number]   Chief Complaint  Patient presents with  . Diabetes  . Hypertension    HPI  Diabetes  He presents for his follow-up diabetic visit. He has type 2 diabetes mellitus. His disease course has been improving. There are no hypoglycemic associated symptoms. There are no diabetic associated symptoms. There are no hypoglycemic complications. Diabetic complications include heart disease and nephropathy. Risk factors for coronary artery disease include diabetes mellitus, dyslipidemia, hypertension, male sex and sedentary lifestyle. Eye exam is current.  Hypertension  This is a chronic problem. The current episode started more than 1 year ago. The problem has been gradually improving since onset. The problem is controlled. Risk factors for coronary artery disease include diabetes mellitus, dyslipidemia, male gender, obesity and sedentary lifestyle.   He reports compliance with medication.   Past Medical History:  Diagnosis Date  . Aortic stenosis    mild AS 11/24/16 (peak grad 24, mean grad 11) Dr. [name]  . CHB (complete heart block) ([place]) 07/2017  . 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 Diabetes mellitus without complication ([place])   . Gout   . Hypertension   . 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 Peripheral arterial disease ([place])   . 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 Second degree AV block    Wenckebach; no indication for pacemaker as of 12/01/16 (Dr. [name])  . Vitamin B12 deficiency anemia 03/01/2018  . Vitamin D deficiency disease      Family History  Problem Relation Age of Onset  . Diabetes Mother   . Breast cancer Mother   . Arthritis Mother   . Hypertension Father   . Diabetes Sister   . Diabetes Brother   . Diabetes Maternal Grandmother   . Diabetes Brother   . Diabetes Brother   . Diabetes Brother   . Diabetes Sister   .  Diabetes Sister   . Diabetes Sister      Current Outpatient Medications:  .  allopurinol (ZYLOPRIM) 100 MG tablet, Take 100 mg by mouth daily. , Disp: , Rfl:  .  amLODipine (NORVASC) 5 MG tablet, TAKE 1 TABLET BY MOUTH EVERY DAY, Disp: 90 tablet, Rfl: 1 .  Ascorbic Acid (VITAMIN C) 1000 MG tablet, Take 1,000 mg by mouth daily., Disp: , Rfl:  .  aspirin 81 MG tablet, Take 81 mg by mouth 3 (three) times a week. , Disp: , Rfl:  .  Cholecalciferol (VITAMIN D3) 5000 units CAPS, Take 5,000 Units by mouth every evening., Disp: , Rfl:  .  colchicine 0.6 MG tablet, Take 0.6 mg by mouth daily as needed (for gout flares)., Disp: , Rfl:  .  glucose blood test strip, 1 each by Other route as needed for other. Use as instructed, Disp: , Rfl:  .  Hypromellose (ISOPTO TEARS OP), Apply to eye., Disp: , Rfl:  .  metFORMIN (GLUCOPHAGE) 1000 MG tablet, Take 1,000 mg by mouth 2 (two) times daily with a meal., Disp: , Rfl:  .  pioglitazone (ACTOS) 45 MG tablet, Take 45 mg by mouth every evening. , Disp: , Rfl:  .  pravastatin (PRAVACHOL) 20 MG tablet, Take 20 mg by mouth 3 (three) times a week. , Disp: , Rfl:  .  sitaGLIPtin (JANUVIA) 100 MG tablet, Take 100 mg by mouth daily., Disp: , Rfl:  .  valsartan (DIOVAN)  320 MG tablet, Take 1 tablet (320 mg total) by mouth daily. Hold for 5 days, then resume, Disp: , Rfl:  .  carvedilol (COREG) 12.5 MG tablet, Take 1 tablet (12.5 mg total) by mouth 2 (two) times daily., Disp: 180 tablet, Rfl: 3   No Known Allergies   Review of Systems  Constitutional: Negative.   Respiratory: Negative.   Cardiovascular: Negative.   Gastrointestinal: Negative.   Musculoskeletal: Positive for back pain (Chronic. He is followed by Drs. Dumonski and Wang. Usually has relief with epidural injections.  Has frequent falls when his sx flare.).  Neurological: Negative.   Psychiatric/Behavioral: Negative.      Today's Vitals   09/24/18 1101  BP: 118/74  Pulse: 60  Temp: 97.8 F (36.6  C)  TempSrc: Oral  Weight: 214 lb 6.4 oz (97.3 kg)  Height: '5\' 8"'  (1.727 m)  PainSc: 0-No pain   Body mass index is 32.6 kg/m.   Objective:  Physical Exam  Constitutional: He is oriented to person, place, and time. He appears well-developed and well-nourished.  HENT:  Head: Normocephalic and atraumatic.  Eyes: EOM are normal.  Cardiovascular: Normal rate, regular rhythm and normal heart sounds.  Pulmonary/Chest: Effort normal and breath sounds normal.  Musculoskeletal: Normal range of motion.  Neurological: He is alert and oriented to person, place, and time.  Walks with a shuffling gait  Psychiatric: He has a normal mood and affect.  Nursing note and vitals reviewed.       Assessment And Plan:     1. Diabetes mellitus with stage 2 chronic kidney disease ([place])  I will check labs as listed below. He has been advised to take Actos MWF until he runs out. We also discussed the use of GLP-1 agents to help control diabetes. He denies family/personal history of thyroid cancer. I will start him on Trulicity, 8.64GE once weekly. He was instructed on how to self administer the medication. He is aware that he will inject this on the same day each week. He will rto in four weeks for re-evaluation. Possible side effects including nausea, constipation and/or diarrhea were discussed with the patient. He is encouraged to stop eating once he becomes full. He verbally acknowledges that he understands his treatment plan.   - Hemoglobin A1c - BMP8+EGFR - Lipid Profile  2. Chronic renal disease, stage II  Chronic, yet stable. He is encouraged to drink adequate fluids daily.   3. Hypertensive heart and renal disease with renal failure, stage 1 through stage 4 or unspecified chronic kidney disease, without heart failure  Well controlled. He will continue with current meds.   4. 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   5. Spinal stenosis of lumbar region with neurogenic  claudication  Chronic. He is encouraged to f/u with Ortho for epidural injection.   6. Need for vaccination  - Flu vaccine HIGH DOSE PF (Fluzone High dose)        Maximino Greenland, MD

## 2018-10-04 ENCOUNTER — Ambulatory Visit (INDEPENDENT_AMBULATORY_CARE_PROVIDER_SITE_OTHER): Payer: Medicare HMO | Admitting: Internal Medicine

## 2018-10-04 ENCOUNTER — Encounter: Payer: Self-pay | Admitting: Internal Medicine

## 2018-10-04 ENCOUNTER — Ambulatory Visit (INDEPENDENT_AMBULATORY_CARE_PROVIDER_SITE_OTHER): Payer: Medicare HMO

## 2018-10-04 VITALS — BP 138/64 | HR 83 | Temp 97.6°F | Ht 68.0 in | Wt 214.0 lb

## 2018-10-04 DIAGNOSIS — G8929 Other chronic pain: Secondary | ICD-10-CM

## 2018-10-04 DIAGNOSIS — Z0000 Encounter for general adult medical examination without abnormal findings: Secondary | ICD-10-CM

## 2018-10-04 DIAGNOSIS — R350 Frequency of micturition: Secondary | ICD-10-CM

## 2018-10-04 DIAGNOSIS — H524 Presbyopia: Secondary | ICD-10-CM | POA: Diagnosis not present

## 2018-10-04 DIAGNOSIS — H5203 Hypermetropia, bilateral: Secondary | ICD-10-CM | POA: Diagnosis not present

## 2018-10-04 DIAGNOSIS — H21261 Iris atrophy (essential) (progressive), right eye: Secondary | ICD-10-CM | POA: Diagnosis not present

## 2018-10-04 DIAGNOSIS — H4321 Crystalline deposits in vitreous body, right eye: Secondary | ICD-10-CM | POA: Diagnosis not present

## 2018-10-04 DIAGNOSIS — H18413 Arcus senilis, bilateral: Secondary | ICD-10-CM | POA: Diagnosis not present

## 2018-10-04 DIAGNOSIS — E119 Type 2 diabetes mellitus without complications: Secondary | ICD-10-CM | POA: Diagnosis not present

## 2018-10-04 DIAGNOSIS — M549 Dorsalgia, unspecified: Secondary | ICD-10-CM

## 2018-10-04 DIAGNOSIS — E1165 Type 2 diabetes mellitus with hyperglycemia: Secondary | ICD-10-CM

## 2018-10-04 DIAGNOSIS — Z961 Presence of intraocular lens: Secondary | ICD-10-CM | POA: Diagnosis not present

## 2018-10-04 DIAGNOSIS — H52223 Regular astigmatism, bilateral: Secondary | ICD-10-CM | POA: Diagnosis not present

## 2018-10-04 DIAGNOSIS — Z7984 Long term (current) use of oral hypoglycemic drugs: Secondary | ICD-10-CM | POA: Diagnosis not present

## 2018-10-04 LAB — POCT URINALYSIS DIPSTICK
BILIRUBIN UA: NEGATIVE
Blood, UA: NEGATIVE
GLUCOSE UA: NEGATIVE
Ketones, UA: NEGATIVE
Leukocytes, UA: NEGATIVE
Nitrite, UA: NEGATIVE
Protein, UA: NEGATIVE
Spec Grav, UA: 1.025 (ref 1.010–1.025)
Urobilinogen, UA: 0.2 E.U./dL
pH, UA: 5 (ref 5.0–8.0)

## 2018-10-04 LAB — HM DIABETES EYE EXAM

## 2018-10-04 MED ORDER — DULAGLUTIDE 0.75 MG/0.5ML ~~LOC~~ SOAJ: 0.7500 mg | SUBCUTANEOUS | 0 refills | Status: DC

## 2018-10-04 NOTE — Progress Notes (Signed)
Subjective:   Dylan [name]. is a 77 y.o. male who presents for Medicare Annual/Subsequent preventive examination.  Review of Systems:  n/a Cardiac Risk Factors include: advanced age (>6men, >35 women);diabetes mellitus;hypertension;sedentary lifestyle;obesity (BMI >30kg/m2)     Objective:    Vitals: BP 138/64 (BP Location: Right Arm, Patient Position: Sitting)   Pulse 83   Temp 97.6 F (36.4 C) (Oral)   Ht 5\' 8"  (1.727 m)   Wt 214 lb (97.1 kg)   SpO2 98%   BMI 32.54 kg/m   Body mass index is 32.54 kg/m.  Advanced Directives 10/04/2018 06/14/2018 06/13/2018 07/11/2017 07/11/2017 07/11/2017 07/11/2017  Does Patient Have a Medical Advance Directive? No - No - No - Yes  Does patient want to make changes to medical advance directive? Yes (MAU/Ambulatory/Procedural Areas - Information given) No - Patient declined - - No - Patient declined No - Patient declined No - Patient declined  Would patient like information on creating a medical advance directive? - No - Patient declined No - Patient declined No - Patient declined - - -    Tobacco Social History   Tobacco Use  Smoking Status Former Smoker  Smokeless Tobacco Never Used  Tobacco Comment   quit 35 years     Counseling given: Not Answered Comment: quit 35 years   Clinical Intake:  Pre-visit preparation completed: Yes  Pain : No/denies pain Pain Score: 0-No pain     Nutritional Risks: None  How often do you need to have someone help you when you read instructions, pamphlets, or other written materials from your doctor or pharmacy?: 1 - Never What is the last grade level you completed in school?: 9th grade  Interpreter Needed?: No  Information entered by :: NAllen LPN  Past Medical History:  Diagnosis Date  . Aortic stenosis    mild AS 11/24/16 (peak grad 24, mean grad 11) Dr. [name]  . CHB (complete heart block) ([place]) 07/2017  . 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 Diabetes mellitus without  complication ([place])   . Gout   . Hypertension   . 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 Peripheral arterial disease ([place])   . 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 Second degree AV block    Wenckebach; no indication for pacemaker as of 12/01/16 (Dr. [name])  . Vitamin B12 deficiency anemia 03/01/2018  . Vitamin D deficiency disease    Past Surgical History:  Procedure Laterality Date  . CARDIOVASCULAR STRESS TEST     11/21/16 Low risk study, EF 52% [facility])  . EYE SURGERY    . KYPHOPLASTY N/A 02/15/2017   Procedure: LUMBAR FOUR KYPHOPLASTY;  Surgeon: Phylliss Bob, MD;  Location: [facility];  Service: Orthopedics;  Laterality: N/A;  . lipoma surgery     neck - 30 years ago  . PACEMAKER IMPLANT N/A 07/11/2017   Procedure: Pacemaker Implant;  Surgeon: Constance Haw, MD;  Location: [facility] CV LAB;  Service: Cardiovascular;  Laterality: N/A;  . TRANSTHORACIC ECHOCARDIOGRAM     11/24/16 [facility] CV): EF 55-60%, mild AS, mild-mod MR, mod TR, moderate pulm HTN, PAP 49 mmHg   Family History  Problem Relation Age of Onset  . Diabetes Mother   . Breast cancer Mother   . Arthritis Mother   . Hypertension Father   . Diabetes Sister   . Diabetes Brother   . Diabetes Maternal Grandmother   . Diabetes Brother   . Diabetes Brother   .  Diabetes Brother   . Diabetes Sister   . Diabetes Sister   . Diabetes Sister    Social History   Socioeconomic History  . Marital status: Married    Spouse name: Not on file  . Number of children: Not on file  . Years of education: Not on file  . Highest education level: Not on file  Occupational History  . Occupation: retired  Scientific laboratory technician  . Financial resource strain: Not hard at all  . Food insecurity:    Worry: Never true    Inability: Never true  . Transportation needs:    Medical: No    Non-medical: No  Tobacco Use  . Smoking status: Former Research scientist (life sciences)  . Smokeless tobacco: Never Used  .  Tobacco comment: quit 35 years  Substance and Sexual Activity  . Alcohol use: No  . Drug use: No  . Sexual activity: Not Currently  Lifestyle  . Physical activity:    Days per week: 0 days    Minutes per session: 0 min  . Stress: Not at all  Relationships  . Social connections:    Talks on phone: Not on file    Gets together: Not on file    Attends religious service: Not on file    Active member of club or organization: Not on file    Attends meetings of clubs or organizations: Not on file    Relationship status: Not on file  Other Topics Concern  . Not on file  Social History Narrative  . Not on file    Outpatient Encounter Medications as of 10/04/2018  Medication Sig  . allopurinol (ZYLOPRIM) 100 MG tablet Take 100 mg by mouth daily.   [name] amLODipine (NORVASC) 5 MG tablet TAKE 1 TABLET BY MOUTH EVERY DAY  . Ascorbic Acid (VITAMIN C) 1000 MG tablet Take 1,000 mg by mouth daily.  [name] aspirin 81 MG tablet Take 81 mg by mouth 3 (three) times a week.   . carvedilol (COREG) 12.5 MG tablet Take 1 tablet (12.5 mg total) by mouth 2 (two) times daily.  . Cholecalciferol (VITAMIN D3) 5000 units CAPS Take 5,000 Units by mouth every evening.  . colchicine 0.6 MG tablet Take 0.6 mg by mouth daily as needed (for gout flares).  [name] glucose blood test strip 1 each by Other route as needed for other. Use as instructed  . Hypromellose (ISOPTO TEARS OP) Apply to eye.  . metFORMIN (GLUCOPHAGE) 1000 MG tablet Take 1,000 mg by mouth daily.   . pioglitazone (ACTOS) 45 MG tablet Take 45 mg by mouth every other day.   . pravastatin (PRAVACHOL) 20 MG tablet Take 20 mg by mouth 3 (three) times a week.   . sitaGLIPtin (JANUVIA) 100 MG tablet Take 100 mg by mouth daily.  . valsartan (DIOVAN) 320 MG tablet Take 1 tablet (320 mg total) by mouth daily. Hold for 5 days, then resume   No facility-administered encounter medications on file as of 10/04/2018.     Activities of Daily Living In your present state of  health, do you have any difficulty performing the following activities: 10/04/2018 06/14/2018  Hearing? N N  Vision? N N  Difficulty concentrating or making decisions? N N  Walking or climbing stairs? Y N  Comment joints hurt -  Dressing or bathing? N N  Doing errands, shopping? N N  Preparing Food and eating ? N -  Using the Toilet? N -  In the past six months, have you accidently leaked  urine? N -  Do you have problems with loss of bowel control? N -  Managing your Medications? N -  Managing your Finances? N -  Housekeeping or managing your Housekeeping? N -  Some recent data might be hidden    Patient Care Team: [name], MD as PCP - General (Internal Medicine) Adrian Prows, MD as PCP - Cardiology (Cardiology) Constance Haw, MD as Consulting Physician (Cardiology)   Assessment:   This is a routine wellness examination for Dylan [name].  Exercise Activities and Dietary recommendations Current Exercise Habits: The patient does not participate in regular exercise at present, Exercise limited by: None identified  Goals   None     Fall Risk Fall Risk  10/04/2018 09/24/2018  Falls in the past year? 0 0  Risk for fall due to : Medication side effect -  Follow up Falls prevention discussed -   Is the patient's home free of loose throw rugs in walkways, pet beds, electrical cords, etc?   yes      Grab bars in the bathroom? no      Handrails on the stairs?   yes      Adequate lighting?   yes  Timed Get Up and Go Performed: n/a  Depression Screen PHQ 2/9 Scores 10/04/2018 09/24/2018  PHQ - 2 Score 0 0    Cognitive Function     6CIT Screen 10/04/2018  What Year? 0 points  What month? 0 points  What time? 0 points  Count back from 20 0 points  Months in reverse 4 points  Repeat phrase 2 points  Total Score 6    Immunization History  Administered Date(s) Administered  . Influenza, High Dose Seasonal PF 09/24/2018  . Influenza-Unspecified 07/17/2016  .  Pneumococcal Conjugate-13 09/20/2016    Qualifies for Shingles Vaccine? yes  Screening Tests Health Maintenance  Topic Date Due  . FOOT EXAM  12/26/1950  . OPHTHALMOLOGY EXAM  12/26/1950  . PNA vac Low Risk Adult (2 of 2 - PPSV23) 09/20/2017  . HEMOGLOBIN A1C  03/25/2019  . TETANUS/TDAP  11/25/2024  . INFLUENZA VACCINE  Completed   Cancer Screenings: Lung: Low Dose CT Chest recommended if Age 68-80 years, 30 pack-year currently smoking OR have quit w/in 15years. Patient does not qualify. Colorectal: not required  Additional Screenings:  Hepatitis C Screening:n/a      Plan:    States seen eye doctor recently.  I have personally reviewed and noted the following in the patient's chart:   . Medical and social history . Use of alcohol, tobacco or illicit drugs  . Current medications and supplements . Functional ability and status . Nutritional status . Physical activity . Advanced directives . List of other physicians . Hospitalizations, surgeries, and ER visits in previous 12 months . Vitals . Screenings to include cognitive, depression, and falls . Referrals and appointments  In addition, I have reviewed and discussed with patient certain preventive protocols, quality metrics, and best practice recommendations. A written personalized care plan for preventive services as well as general preventive health recommendations were provided to patient.     Kellie Simmering, LPN  22/07/[id number]

## 2018-10-04 NOTE — Patient Instructions (Signed)
Dylan [name] , Thank you for taking time to come for your Medicare Wellness Visit. I appreciate your ongoing commitment to your health goals. Please review the following plan we discussed and let me know if I can assist you in the future.   Screening recommendations/referrals: Colonoscopy: not required Recommended yearly ophthalmology/optometry visit for glaucoma screening and checkup Recommended yearly dental visit for hygiene and checkup  Vaccinations: Influenza vaccine: 08/2018 Pneumococcal vaccine: 08/2016 Tdap vaccine: 10/2014 Shingles vaccine: declined    Advanced directives: Advance directive discussed with you today. I have provided a copy for you to complete at home and have notarized. Once this is complete please bring a copy in to our office so we can scan it into your chart.   Conditions/risks identified: Obesity  Next appointment: 10/22/2018 at 12:15p  Preventive Care 65 Years and Older, Male Preventive care refers to lifestyle choices and visits with your health care provider that can promote health and wellness. What does preventive care include?  A yearly physical exam. This is also called an annual well check.  Dental exams once or twice a year.  Routine eye exams. Ask your health care provider how often you should have your eyes checked.  Personal lifestyle choices, including:  Daily care of your teeth and gums.  Regular physical activity.  Eating a healthy diet.  Avoiding tobacco and drug use.  Limiting alcohol use.  Practicing safe sex.  Taking low doses of aspirin every day.  Taking vitamin and mineral supplements as recommended by your health care provider. What happens during an annual well check? The services and screenings done by your health care provider during your annual well check will depend on your age, overall health, lifestyle risk factors, and family history of disease. Counseling  Your health care provider may ask you questions  about your:  Alcohol use.  Tobacco use.  Drug use.  Emotional well-being.  Home and relationship well-being.  Sexual activity.  Eating habits.  History of falls.  Memory and ability to understand (cognition).  Work and work Statistician. Screening  You may have the following tests or measurements:  Height, weight, and BMI.  Blood pressure.  Lipid and cholesterol levels. These may be checked every 5 years, or more frequently if you are over 14 years old.  Skin check.  Lung cancer screening. You may have this screening every year starting at age 11 if you have a 30-pack-year history of smoking and currently smoke or have quit within the past 15 years.  Fecal occult blood test (FOBT) of the stool. You may have this test every year starting at age 58.  Flexible sigmoidoscopy or colonoscopy. You may have a sigmoidoscopy every 5 years or a colonoscopy every 10 years starting at age 91.  Prostate cancer screening. Recommendations will vary depending on your family history and other risks.  Hepatitis C blood test.  Hepatitis B blood test.  Sexually transmitted disease (STD) testing.  Diabetes screening. This is done by checking your blood sugar (glucose) after you have not eaten for a while (fasting). You may have this done every 1-3 years.  Abdominal aortic aneurysm (AAA) screening. You may need this if you are a current or former smoker.  Osteoporosis. You may be screened starting at age 42 if you are at high risk. Talk with your health care provider about your test results, treatment options, and if necessary, the need for more tests. Vaccines  Your health care provider may recommend certain vaccines, such as:  Influenza vaccine. This is recommended every year.  Tetanus, diphtheria, and acellular pertussis (Tdap, Td) vaccine. You may need a Td booster every 10 years.  Zoster vaccine. You may need this after age 44.  Pneumococcal 13-valent conjugate (PCV13)  vaccine. One dose is recommended after age 64.  Pneumococcal polysaccharide (PPSV23) vaccine. One dose is recommended after age 59. Talk to your health care provider about which screenings and vaccines you need and how often you need them. This information is not intended to replace advice given to you by your health care provider. Make sure you discuss any questions you have with your health care provider. Document Released: 11/13/2015 Document Revised: 07/06/2016 Document Reviewed: 08/18/2015 Elsevier Interactive Patient Education  2017 [place] Prevention in the Home Falls can cause injuries. They can happen to people of all ages. There are many things you can do to make your home safe and to help prevent falls. What can I do on the outside of my home?  Regularly fix the edges of walkways and driveways and fix any cracks.  Remove anything that might make you trip as you walk through a door, such as a raised step or threshold.  Trim any bushes or trees on the path to your home.  Use bright outdoor lighting.  Clear any walking paths of anything that might make someone trip, such as rocks or tools.  Regularly check to see if handrails are loose or broken. Make sure that both sides of any steps have handrails.  Any raised decks and porches should have guardrails on the edges.  Have any leaves, snow, or ice cleared regularly.  Use sand or salt on walking paths during winter.  Clean up any spills in your garage right away. This includes oil or grease spills. What can I do in the bathroom?  Use night lights.  Install grab bars by the toilet and in the tub and shower. Do not use towel bars as grab bars.  Use non-skid mats or decals in the tub or shower.  If you need to sit down in the shower, use a plastic, non-slip stool.  Keep the floor dry. Clean up any water that spills on the floor as soon as it happens.  Remove soap buildup in the tub or shower  regularly.  Attach bath mats securely with double-sided non-slip rug tape.  Do not have throw rugs and other things on the floor that can make you trip. What can I do in the bedroom?  Use night lights.  Make sure that you have a light by your bed that is easy to reach.  Do not use any sheets or blankets that are too big for your bed. They should not hang down onto the floor.  Have a firm chair that has side arms. You can use this for support while you get dressed.  Do not have throw rugs and other things on the floor that can make you trip. What can I do in the kitchen?  Clean up any spills right away.  Avoid walking on wet floors.  Keep items that you use a lot in easy-to-reach places.  If you need to reach something above you, use a strong step stool that has a grab bar.  Keep electrical cords out of the way.  Do not use floor polish or wax that makes floors slippery. If you must use wax, use non-skid floor wax.  Do not have throw rugs and other things on the floor that  can make you trip. What can I do with my stairs?  Do not leave any items on the stairs.  Make sure that there are handrails on both sides of the stairs and use them. Fix handrails that are broken or loose. Make sure that handrails are as long as the stairways.  Check any carpeting to make sure that it is firmly attached to the stairs. Fix any carpet that is loose or worn.  Avoid having throw rugs at the top or bottom of the stairs. If you do have throw rugs, attach them to the floor with carpet tape.  Make sure that you have a light switch at the top of the stairs and the bottom of the stairs. If you do not have them, ask someone to add them for you. What else can I do to help prevent falls?  Wear shoes that:  Do not have high heels.  Have rubber bottoms.  Are comfortable and fit you well.  Are closed at the toe. Do not wear sandals.  If you use a stepladder:  Make sure that it is fully  opened. Do not climb a closed stepladder.  Make sure that both sides of the stepladder are locked into place.  Ask someone to hold it for you, if possible.  Clearly mark and make sure that you can see:  Any grab bars or handrails.  First and last steps.  Where the edge of each step is.  Use tools that help you move around (mobility aids) if they are needed. These include:  Canes.  Walkers.  Scooters.  Crutches.  Turn on the lights when you go into a dark area. Replace any light bulbs as soon as they burn out.  Set up your furniture so you have a clear path. Avoid moving your furniture around.  If any of your floors are uneven, fix them.  If there are any pets around you, be aware of where they are.  Review your medicines with your doctor. Some medicines can make you feel dizzy. This can increase your chance of falling. Ask your doctor what other things that you can do to help prevent falls. This information is not intended to replace advice given to you by your health care provider. Make sure you discuss any questions you have with your health care provider. Document Released: 08/13/2009 Document Revised: 03/24/2016 Document Reviewed: 11/21/2014 Elsevier Interactive Patient Education  2017 [employer].

## 2018-10-04 NOTE — Progress Notes (Signed)
Subjective:     Patient ID: Dylan [name]. , male    DOB: 04-23-1941 , 77 y.o.   MRN: [id number]   CC- " I dont know why I'm here"  HPI Pt was here for Medicare Wellness visit and was scheduled to have a physical. But Pt declined having a physical today. Wants  His urine checked.States he saw Dr Baird Cancer about his chronic back pain last week. I mentioned to him she had told him to follow up with ortho for lumbar epidural injection, but he did not remember that. Has finished his Actos and started the Trulicity, 0.75mg  once weekly and is tolerating this fine.    Past Medical History:  Diagnosis Date  . Aortic stenosis    mild AS 11/24/16 (peak grad 24, mean grad 11) Dr. [name]  . CHB (complete heart block) ([place]) 07/2017  . 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 Diabetes mellitus without complication ([place])   . Gout   . Hypertension   . 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 Peripheral arterial disease ([place])   . 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 Second degree AV block    Wenckebach; no indication for pacemaker as of 12/01/16 (Dr. [name])  . Vitamin B12 deficiency anemia 03/01/2018  . Vitamin D deficiency disease      Family History  Problem Relation Age of Onset  . Diabetes Mother   . Breast cancer Mother   . Arthritis Mother   . Hypertension Father   . Diabetes Sister   . Diabetes Brother   . Diabetes Maternal Grandmother   . Diabetes Brother   . Diabetes Brother   . Diabetes Brother   . Diabetes Sister   . Diabetes Sister   . Diabetes Sister      Current Outpatient Medications:  .  allopurinol (ZYLOPRIM) 100 MG tablet, Take 100 mg by mouth daily. , Disp: , Rfl:  .  amLODipine (NORVASC) 5 MG tablet, TAKE 1 TABLET BY MOUTH EVERY DAY, Disp: 90 tablet, Rfl: 1 .  Ascorbic Acid (VITAMIN C) 1000 MG tablet, Take 1,000 mg by mouth daily., Disp: , Rfl:  .  aspirin 81 MG tablet, Take 81 mg by mouth 3 (three) times a week. , Disp: ,  Rfl:  .  carvedilol (COREG) 12.5 MG tablet, Take 1 tablet (12.5 mg total) by mouth 2 (two) times daily., Disp: 180 tablet, Rfl: 3 .  Cholecalciferol (VITAMIN D3) 5000 units CAPS, Take 5,000 Units by mouth every evening., Disp: , Rfl:  .  colchicine 0.6 MG tablet, Take 0.6 mg by mouth daily as needed (for gout flares)., Disp: , Rfl:  .  glucose blood test strip, 1 each by Other route as needed for other. Use as instructed, Disp: , Rfl:  .  Hypromellose (ISOPTO TEARS OP), Apply to eye., Disp: , Rfl:  .  metFORMIN (GLUCOPHAGE) 1000 MG tablet, Take 1,000 mg by mouth daily. , Disp: , Rfl:  .  pioglitazone (ACTOS) 45 MG tablet, Take 45 mg by mouth every other day. , Disp: , Rfl:  .  pravastatin (PRAVACHOL) 20 MG tablet, Take 20 mg by mouth 3 (three) times a week. , Disp: , Rfl:  .  sitaGLIPtin (JANUVIA) 100 MG tablet, Take 100 mg by mouth daily., Disp: , Rfl:  .  valsartan (DIOVAN) 320 MG tablet, Take 1 tablet (320 mg total) by mouth daily. Hold for 5 days, then resume, Disp: , Rfl:    No Known Allergies  Review of Systems  Constitutional: Negative for appetite change, chills, diaphoresis and fever.  HENT: Negative for tinnitus.   Respiratory: Negative for cough, chest tightness and shortness of breath.   Cardiovascular: Negative for chest pain and leg swelling.  Gastrointestinal: Negative for abdominal pain.  Genitourinary: Positive for frequency. Negative for dysuria, flank pain and urgency.       + nocturia but better this week, has frequency but his wife says he drinks a lot of fluids  Musculoskeletal: Positive for back pain.       Chronic back pain  Skin: Negative for rash and wound.  Neurological: Negative for weakness and headaches.  UA- dip SG 1.025. Rest is negative. There were no vitals filed for this visit. There is no height or weight on file to calculate BMI.   Objective:  Physical Exam   Constitutional: She is oriented to person, place, and time. She appears well-developed  and well-nourished. No distress. He shuffles when he walks HENT:  Head: Normocephalic and atraumatic.  Right Ear: External ear normal.  Left Ear: External ear normal.  Nose: Nose normal.  Eyes: Conjunctivae are normal. Right eye exhibits no discharge. Left eye exhibits no discharge. No scleral icterus.  Neck: Neck supple. No thyromegaly present.  No carotid bruits bilaterally  Cardiovascular: Normal rate and regular rhythm.  No murmur heard. Pulmonary/Chest: Effort normal and breath sounds normal. No respiratory distress.  Musculoskeletal: Normal range of motion. He exhibits no edema.  Lymphadenopathy:    He has no cervical adenopathy.  Neurological: She is alert and oriented to person, place, and time.  Skin: Skin is warm and dry. Capillary refill takes less than 2 seconds. No rash noted. She is not diaphoretic.  Psychiatric: She has a normal mood and affect. Her behavior is normal. Judgment and thought content normal.  Nursing note reviewed.  Assessment And Plan:    1. Frequency of urination- due to increased drinking - POCT Urinalysis Dipstick (81002)- neg  2. Uncontrolled type 2 diabetes mellitus with hyperglycemia ([place])- he will continue with plan discussed with him last week with his PCP.  3- Chronic back pain- advised to call ortho to get steroid injection  Gavriel Holzhauer [place], PA-C

## 2018-10-05 LAB — POCT UA - MICROALBUMIN
Albumin/Creatinine Ratio, Urine, POC: <30
Creatinine, POC: 200 mg/dL
Microalbumin Ur, POC: 80 mg/L

## 2018-10-05 NOTE — Addendum Note (Signed)
Addended by: [name] on: 10/05/2018 08:20 AM   Modules accepted: Orders

## 2018-10-09 ENCOUNTER — Ambulatory Visit: Payer: Medicare HMO | Admitting: Podiatry

## 2018-10-11 ENCOUNTER — Encounter: Payer: Medicare HMO | Admitting: Internal Medicine

## 2018-10-11 ENCOUNTER — Ambulatory Visit: Payer: Medicare HMO | Admitting: Podiatry

## 2018-10-11 ENCOUNTER — Ambulatory Visit: Payer: Medicare HMO

## 2018-10-11 ENCOUNTER — Ambulatory Visit (INDEPENDENT_AMBULATORY_CARE_PROVIDER_SITE_OTHER): Payer: Medicare HMO | Admitting: Podiatry

## 2018-10-11 DIAGNOSIS — M79675 Pain in left toe(s): Secondary | ICD-10-CM

## 2018-10-11 DIAGNOSIS — B351 Tinea unguium: Secondary | ICD-10-CM

## 2018-10-11 DIAGNOSIS — L02611 Cutaneous abscess of right foot: Secondary | ICD-10-CM | POA: Diagnosis not present

## 2018-10-11 DIAGNOSIS — E1151 Type 2 diabetes mellitus with diabetic peripheral angiopathy without gangrene: Secondary | ICD-10-CM

## 2018-10-11 DIAGNOSIS — M79674 Pain in right toe(s): Secondary | ICD-10-CM

## 2018-10-11 NOTE — Patient Instructions (Signed)

## 2018-10-12 ENCOUNTER — Other Ambulatory Visit: Payer: Self-pay

## 2018-10-12 MED ORDER — COLCRYS 0.6 MG PO TABS: 0.6000 mg | ORAL_TABLET | Freq: Every day | ORAL | 1 refills | Status: DC

## 2018-10-20 LAB — CUP PACEART INCLINIC DEVICE CHECK
Date Time Interrogation Session: 20191221192541
Implantable Lead Implant Date: 20180911
Implantable Lead Location: 753859
Implantable Lead Location: 753860
Lead Channel Setting Pacing Amplitude: 2 V
Lead Channel Setting Pacing Amplitude: 2.5 V
Lead Channel Setting Pacing Pulse Width: 0.5 ms
Lead Channel Setting Sensing Sensitivity: 4 mV
MDC IDC LEAD IMPLANT DT: 20180911
MDC IDC PG IMPLANT DT: 20180911
Pulse Gen Model: 2272
Pulse Gen Serial Number: 8940191

## 2018-10-22 ENCOUNTER — Ambulatory Visit: Payer: Medicare HMO | Admitting: Internal Medicine

## 2018-10-22 ENCOUNTER — Encounter: Payer: Self-pay | Admitting: Internal Medicine

## 2018-10-22 ENCOUNTER — Ambulatory Visit (INDEPENDENT_AMBULATORY_CARE_PROVIDER_SITE_OTHER): Payer: Medicare HMO | Admitting: Internal Medicine

## 2018-10-22 VITALS — BP 110/64 | HR 72 | Temp 97.5°F | Ht 68.0 in | Wt 213.8 lb

## 2018-10-22 DIAGNOSIS — W19XXXA Unspecified fall, initial encounter: Secondary | ICD-10-CM

## 2018-10-22 DIAGNOSIS — N183 Chronic kidney disease, stage 3 unspecified: Secondary | ICD-10-CM

## 2018-10-22 DIAGNOSIS — W01190A Fall on same level from slipping, tripping and stumbling with subsequent striking against furniture, initial encounter: Secondary | ICD-10-CM

## 2018-10-22 DIAGNOSIS — E1122 Type 2 diabetes mellitus with diabetic chronic kidney disease: Secondary | ICD-10-CM | POA: Diagnosis not present

## 2018-10-22 DIAGNOSIS — Z6832 Body mass index (BMI) 32.0-32.9, adult: Secondary | ICD-10-CM

## 2018-10-22 DIAGNOSIS — I1310 Hypertensive heart and chronic kidney disease without heart failure, with stage 1 through stage 4 chronic kidney disease, or unspecified chronic kidney disease: Secondary | ICD-10-CM

## 2018-10-22 DIAGNOSIS — I442 Atrioventricular block, complete: Secondary | ICD-10-CM

## 2018-10-22 DIAGNOSIS — E6609 Other obesity due to excess calories: Secondary | ICD-10-CM

## 2018-10-22 LAB — CMP14+EGFR
A/G RATIO: 1.6 (ref 1.2–2.2)
ALT: 15 IU/L (ref 0–44)
AST: 27 IU/L (ref 0–40)
Albumin: 4.2 g/dL (ref 3.5–4.8)
Alkaline Phosphatase: 101 IU/L (ref 39–117)
BUN/Creatinine Ratio: 21 (ref 10–24)
BUN: 45 mg/dL — ABNORMAL HIGH (ref 8–27)
Bilirubin Total: 0.3 mg/dL (ref 0.0–1.2)
CO2: 22 mmol/L (ref 20–29)
Calcium: 9.7 mg/dL (ref 8.6–10.2)
Chloride: 102 mmol/L (ref 96–106)
Creatinine, Ser: 2.15 mg/dL — ABNORMAL HIGH (ref 0.76–1.27)
GFR calc non Af Amer: 29 mL/min/{1.73_m2} — ABNORMAL LOW (ref >59)
GFR, EST AFRICAN AMERICAN: 33 mL/min/{1.73_m2} — AB (ref >59)
Globulin, Total: 2.6 g/dL (ref 1.5–4.5)
Glucose: 146 mg/dL — ABNORMAL HIGH (ref 65–99)
Potassium: 5.9 mmol/L — ABNORMAL HIGH (ref 3.5–5.2)
Sodium: 139 mmol/L (ref 134–144)
Total Protein: 6.8 g/dL (ref 6.0–8.5)

## 2018-10-22 LAB — LIPID PANEL
Chol/HDL Ratio: 2.7 ratio (ref 0.0–5.0)
Cholesterol, Total: 115 mg/dL (ref 100–199)
HDL: 43 mg/dL (ref >39)
LDL CALC: 55 mg/dL (ref 0–99)
Triglycerides: 85 mg/dL (ref 0–149)
VLDL Cholesterol Cal: 17 mg/dL (ref 5–40)

## 2018-10-22 LAB — HEMOGLOBIN A1C
Est. average glucose Bld gHb Est-mCnc: 154 mg/dL
Hgb A1c MFr Bld: 7 % — ABNORMAL HIGH (ref 4.8–5.6)

## 2018-10-22 MED ORDER — DULAGLUTIDE 1.5 MG/0.5ML ~~LOC~~ SOAJ: 1.5000 mg | SUBCUTANEOUS | 1 refills | Status: DC

## 2018-10-22 NOTE — Progress Notes (Signed)
Subjective:     Patient ID: Dylan [name]. , male    DOB: [birth year] , 77 y.o.   MRN: [id number]   Chief Complaint  Patient presents with  . Trulicity f/u    HPI  He is here today for f/u Trulicity and a diabetes check. He has been using 0.60m once weekly. He has not had any issues with the medication. He does admit his sugars have improved since starting Trulicity.   Hypertension  This is a chronic problem. The current episode started more than 1 year ago. The problem has been gradually improving since onset. The problem is controlled. Pertinent negatives include no blurred vision, chest pain, headaches, palpitations or shortness of breath.     Past Medical History:  Diagnosis Date  . Aortic stenosis    mild AS 11/24/16 (peak grad 24, mean grad 11) Dr. GEinar Gip . CHB (complete heart block) ([facility] 07/2017  . 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 Diabetes mellitus without complication ([place]   . Gout   . Hypertension   . 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 Peripheral arterial disease ([facility]   . 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 Second degree AV block    Wenckebach; no indication for pacemaker as of 12/01/16 (Dr. GEinar Gip  . Vitamin B12 deficiency anemia 03/01/2018  . Vitamin D deficiency disease      Family History  Problem Relation Age of Onset  . Diabetes Mother   . Breast cancer Mother   . Arthritis Mother   . Hypertension Father   . Diabetes Sister   . Diabetes Brother   . Diabetes Maternal Grandmother   . Diabetes Brother   . Diabetes Brother   . Diabetes Brother   . Diabetes Sister   . Diabetes Sister   . Diabetes Sister      Current Outpatient Medications:  .  allopurinol (ZYLOPRIM) 100 MG tablet, Take 100 mg by mouth daily. , Disp: , Rfl:  .  amLODipine (NORVASC) 5 MG tablet, TAKE 1 TABLET BY MOUTH EVERY DAY, Disp: 90 tablet, Rfl: 1 .  Ascorbic Acid (VITAMIN C) 1000 MG tablet, Take 1,000 mg by mouth daily.,  Disp: , Rfl:  .  aspirin 81 MG tablet, Take 81 mg by mouth 3 (three) times a week. , Disp: , Rfl:  .  carvedilol (COREG) 12.5 MG tablet, Take 1 tablet (12.5 mg total) by mouth 2 (two) times daily., Disp: 180 tablet, Rfl: 3 .  Cholecalciferol (VITAMIN D3) 5000 units CAPS, Take 5,000 Units by mouth every evening., Disp: , Rfl:  .  COLCRYS 0.6 MG tablet, Take 1 tablet (0.6 mg total) by mouth daily., Disp: 90 tablet, Rfl: 1 .  glucose blood test strip, 1 each by Other route as needed for other. Use as instructed, Disp: , Rfl:  .  metFORMIN (GLUCOPHAGE) 1000 MG tablet, Take 1,000 mg by mouth daily. , Disp: , Rfl:  .  pioglitazone (ACTOS) 45 MG tablet, Take 45 mg by mouth every other day. , Disp: , Rfl:  .  pravastatin (PRAVACHOL) 20 MG tablet, Take 20 mg by mouth 3 (three) times a week. , Disp: , Rfl:  .  sitaGLIPtin (JANUVIA) 100 MG tablet, Take 100 mg by mouth daily., Disp: , Rfl:  .  valsartan (DIOVAN) 320 MG tablet, Take 1 tablet (320 mg total) by mouth daily. Hold for 5 days, then resume, Disp: , Rfl:  .  Dulaglutide (TRULICITY) 1.5 MTC/4.8LYSOPN, Inject  1.5 mg into the skin once a week., Disp: 4 pen, Rfl: 1   No Known Allergies   Review of Systems  Constitutional: Negative.   Eyes: Negative for blurred vision.  Respiratory: Negative.  Negative for shortness of breath.   Cardiovascular: Negative for chest pain and palpitations.  Gastrointestinal: Negative.   Genitourinary: Negative.   Musculoskeletal: Positive for arthralgias (he reports he had a fall yesterday. he slipped while reaching for a cap in the cupboard. this is near his bed. he slipped and fell onto the bed. He reports he did not injure himself, he was just a little sore this am when he got out of bed. ).  Neurological: Negative for headaches.  Psychiatric/Behavioral: Negative.      Today's Vitals   10/22/18 1038  BP: 110/64  Pulse: 72  Temp: (!) 97.5 F (36.4 C)  TempSrc: Oral  Weight: 213 lb 12.8 oz (97 kg)  Height: 5'  8" (1.727 m)  PainSc: 9   PainLoc: Knee   Body mass index is 32.51 kg/m.   Objective:  Physical Exam Vitals signs and nursing note reviewed.  Constitutional:      Appearance: Normal appearance. He is obese.  HENT:     Head: Normocephalic and atraumatic.  Cardiovascular:     Rate and Rhythm: Normal rate and regular rhythm.     Heart sounds: Normal heart sounds.  Pulmonary:     Effort: Pulmonary effort is normal.     Breath sounds: Normal breath sounds.  Skin:    General: Skin is warm and dry.  Neurological:     Mental Status: He is alert.  Psychiatric:        Mood and Affect: Mood normal.         Assessment And Plan:     1. Type 2 diabetes mellitus with stage 3 chronic kidney disease, without long-term current use of insulin ([place])  I will check labs as listed below. He again is encouraged to increase his daily activity.   - CMP14+EGFR - Hemoglobin A1c - Lipid Profile  2. Chronic renal disease, stage III (HCC)  Chronic. I will check GFR, Cr today.   3. Hypertensive heart and renal disease with renal failure, stage 1 through stage 4 or unspecified chronic kidney disease, without heart failure  Well controlled. He will continue with current meds. He is encouraged to avoid adding salt to his foods.   4. 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   5. Fall, initial encounter  He is encouraged to consider moving his caps to another cupboard where he doesn't have to reach so high. OR, he can install some hooks on closet door as another option. He reports that he will likely keep caps where they are currently located and be more careful when grabbing them in the future.   6. Class 1 obesity due to excess calories with serious comorbidity and body mass index (BMI) of 32.0 to 32.9 in adult  He is encouraged to strive for BMI less than 30 to decrease cardiac risk. He is encouraged to avoid white breads, rice and pasta and to increase his activity level.   Maximino Greenland, MD

## 2018-10-22 NOTE — Patient Instructions (Signed)
DASH Eating Plan  DASH stands for "Dietary Approaches to Stop Hypertension." The DASH eating plan is a healthy eating plan that has been shown to reduce high blood pressure (hypertension). It may also reduce your risk for type 2 diabetes, heart disease, and stroke. The DASH eating plan may also help with weight loss.  What are tips for following this plan?    General guidelines   Avoid eating more than 2,300 mg (milligrams) of salt (sodium) a day. If you have hypertension, you may need to reduce your sodium intake to 1,500 mg a day.   Limit alcohol intake to no more than 1 drink a day for nonpregnant women and 2 drinks a day for men. One drink equals 12 oz of beer, 5 oz of wine, or 1 oz of hard liquor.   Work with your health care provider to maintain a healthy body weight or to lose weight. Ask what an ideal weight is for you.   Get at least 30 minutes of exercise that causes your heart to beat faster (aerobic exercise) most days of the week. Activities may include walking, swimming, or biking.   Work with your health care provider or diet and nutrition specialist (dietitian) to adjust your eating plan to your individual calorie needs.  Reading food labels     Check food labels for the amount of sodium per serving. Choose foods with less than 5 percent of the Daily Value of sodium. Generally, foods with less than 300 mg of sodium per serving fit into this eating plan.   To find whole grains, look for the word "whole" as the first word in the ingredient list.  Shopping   Buy products labeled as "low-sodium" or "no salt added."   Buy fresh foods. Avoid canned foods and premade or frozen meals.  Cooking   Avoid adding salt when cooking. Use salt-free seasonings or herbs instead of table salt or sea salt. Check with your health care provider or pharmacist before using salt substitutes.   Do not fry foods. Cook foods using healthy methods such as baking, boiling, grilling, and broiling instead.   Cook with  heart-healthy oils, such as olive, canola, soybean, or sunflower oil.  Meal planning   Eat a balanced diet that includes:  ? 5 or more servings of fruits and vegetables each day. At each meal, try to fill half of your plate with fruits and vegetables.  ? Up to 6-8 servings of whole grains each day.  ? Less than 6 oz of lean meat, poultry, or fish each day. A 3-oz serving of meat is about the same size as a deck of cards. One egg equals 1 oz.  ? 2 servings of low-fat dairy each day.  ? A serving of nuts, seeds, or beans 5 times each week.  ? Heart-healthy fats. Healthy fats called Omega-3 fatty acids are found in foods such as flaxseeds and coldwater fish, like sardines, salmon, and mackerel.   Limit how much you eat of the following:  ? Canned or prepackaged foods.  ? Food that is high in trans fat, such as fried foods.  ? Food that is high in saturated fat, such as fatty meat.  ? Sweets, desserts, sugary drinks, and other foods with added sugar.  ? Full-fat dairy products.   Do not salt foods before eating.   Try to eat at least 2 vegetarian meals each week.   Eat more home-cooked food and less restaurant, buffet, and fast food.     When eating at a restaurant, ask that your food be prepared with less salt or no salt, if possible.  What foods are recommended?  The items listed may not be a complete list. Talk with your dietitian about what dietary choices are best for you.  Grains  Whole-grain or whole-wheat bread. Whole-grain or whole-wheat pasta. Brown rice. Oatmeal. Quinoa. Bulgur. Whole-grain and low-sodium cereals. Pita bread. Low-fat, low-sodium crackers. Whole-wheat flour tortillas.  Vegetables  Fresh or frozen vegetables (raw, steamed, roasted, or grilled). Low-sodium or reduced-sodium tomato and vegetable juice. Low-sodium or reduced-sodium tomato sauce and tomato paste. Low-sodium or reduced-sodium canned vegetables.  Fruits  All fresh, dried, or frozen fruit. Canned fruit in natural juice (without  added sugar).  Meat and other protein foods  Skinless chicken or turkey. Ground chicken or turkey. Pork with fat trimmed off. Fish and seafood. Egg whites. Dried beans, peas, or lentils. Unsalted nuts, nut butters, and seeds. Unsalted canned beans. Lean cuts of beef with fat trimmed off. Low-sodium, lean deli meat.  Dairy  Low-fat (1%) or fat-free (skim) milk. Fat-free, low-fat, or reduced-fat cheeses. Nonfat, low-sodium ricotta or cottage cheese. Low-fat or nonfat yogurt. Low-fat, low-sodium cheese.  Fats and oils  Soft margarine without trans fats. Vegetable oil. Low-fat, reduced-fat, or light mayonnaise and salad dressings (reduced-sodium). Canola, safflower, olive, soybean, and sunflower oils. Avocado.  Seasoning and other foods  Herbs. Spices. Seasoning mixes without salt. Unsalted popcorn and pretzels. Fat-free sweets.  What foods are not recommended?  The items listed may not be a complete list. Talk with your dietitian about what dietary choices are best for you.  Grains  Baked goods made with fat, such as croissants, muffins, or some breads. Dry pasta or rice meal packs.  Vegetables  Creamed or fried vegetables. Vegetables in a cheese sauce. Regular canned vegetables (not low-sodium or reduced-sodium). Regular canned tomato sauce and paste (not low-sodium or reduced-sodium). Regular tomato and vegetable juice (not low-sodium or reduced-sodium). Pickles. Olives.  Fruits  Canned fruit in a light or heavy syrup. Fried fruit. Fruit in cream or butter sauce.  Meat and other protein foods  Fatty cuts of meat. Ribs. Fried meat. Bacon. Sausage. Bologna and other processed lunch meats. Salami. Fatback. Hotdogs. Bratwurst. Salted nuts and seeds. Canned beans with added salt. Canned or smoked fish. Whole eggs or egg yolks. Chicken or turkey with skin.  Dairy  Whole or 2% milk, cream, and half-and-half. Whole or full-fat cream cheese. Whole-fat or sweetened yogurt. Full-fat cheese. Nondairy creamers. Whipped toppings.  Processed cheese and cheese spreads.  Fats and oils  Butter. Stick margarine. Lard. Shortening. Ghee. Bacon fat. Tropical oils, such as coconut, palm kernel, or palm oil.  Seasoning and other foods  Salted popcorn and pretzels. Onion salt, garlic salt, seasoned salt, table salt, and sea salt. Worcestershire sauce. Tartar sauce. Barbecue sauce. Teriyaki sauce. Soy sauce, including reduced-sodium. Steak sauce. Canned and packaged gravies. Fish sauce. Oyster sauce. Cocktail sauce. Horseradish that you find on the shelf. Ketchup. Mustard. Meat flavorings and tenderizers. Bouillon cubes. Hot sauce and Tabasco sauce. Premade or packaged marinades. Premade or packaged taco seasonings. Relishes. Regular salad dressings.  Where to find more information:   National Heart, Lung, and Blood Institute: www.nhlbi.nih.gov   American Heart Association: www.heart.org  Summary   The DASH eating plan is a healthy eating plan that has been shown to reduce high blood pressure (hypertension). It may also reduce your risk for type 2 diabetes, heart disease, and stroke.   With the   DASH eating plan, you should limit salt (sodium) intake to 2,300 mg a day. If you have hypertension, you may need to reduce your sodium intake to 1,500 mg a day.   When on the DASH eating plan, aim to eat more fresh fruits and vegetables, whole grains, lean proteins, low-fat dairy, and heart-healthy fats.   Work with your health care provider or diet and nutrition specialist (dietitian) to adjust your eating plan to your individual calorie needs.  This information is not intended to replace advice given to you by your health care provider. Make sure you discuss any questions you have with your health care provider.  Document Released: 10/06/2011 Document Revised: 10/10/2016 Document Reviewed: 10/10/2016  Elsevier Interactive Patient Education  2019 [employer]vier Inc.

## 2018-10-23 ENCOUNTER — Encounter: Payer: Self-pay | Admitting: Internal Medicine

## 2018-10-28 NOTE — Progress Notes (Signed)
Your kidney fxn has decreased. pls increase water intake. He needs to stop metformin immediately. Also, is he still taking actos? Is he still taking [place]? If he is still taking [place], he needs to stop b/c this is no longer needed while on trulicity. pls update his med list accordingly. Liver fxn is nl. hba1c is 7.0. this is great. Chol is great.  C/w current meds.

## 2018-11-02 ENCOUNTER — Other Ambulatory Visit: Payer: Self-pay

## 2018-11-06 ENCOUNTER — Other Ambulatory Visit: Payer: Self-pay | Admitting: Internal Medicine

## 2018-11-07 DIAGNOSIS — M48062 Spinal stenosis, lumbar region with neurogenic claudication: Secondary | ICD-10-CM | POA: Diagnosis not present

## 2018-11-11 ENCOUNTER — Encounter: Payer: Self-pay | Admitting: Podiatry

## 2018-11-11 ENCOUNTER — Other Ambulatory Visit: Payer: Self-pay | Admitting: Internal Medicine

## 2018-11-11 NOTE — Progress Notes (Signed)
Subjective: Patient presents today with diabetes and cc of painful, discolored, thick toenails which interfere with daily activities. Pain is aggravated when wearing enclosed shoe gear. Pain is getting progressively worse and relieved with periodic professional debridement.  [name], MD is his PCP and last DOS was 09/24/2018.  Mr. Furness is under care of Dr. Gwenlyn Found for PAD.  No Known Allergies   Objective:  Vascular Examination: Capillary refill time <3 seconds x 10 digits Dorsalis pedis pulses right 0/4; left 2/4 b/l Posterior tibial pulses right 0/4; left 2/4  No digital hair x 10 digits Skin temperature warm to cool b/l  Dermatological Examination: Skin thin, shiny and atrophic b/l  Toenails 1-5 b/l discolored, thick, dystrophic with subungual debris and pain with palpation to nailbeds due to thickness of nails.  Incurvated nailplate right 2nd digit with superficial , localized abscess medial border. No   Musculoskeletal: Muscle strength 5/5 to all LE muscle groups  HAV deformity left foot  Neurological: Sensation intact with 10 gram monofilament. Vibratory sensation intact.  Assessment: 1. Painful onychomycosis toenails 1-5 b/l 2. NIDDM with Peripheral arterial disease  Plan: 1. Toenails 1-5 b/l were debrided in length and girth without iatrogenic bleeding. 2. Localized abscess incised and drained right 2nd digit. Digit cleansed with wound cleanser. Triple antibiotic ointment and bandaid applied. Samples of antibiotic ointment was given to Mr. Culbreath. He is to apply antibiotic ointment once daily for one week. He is to call should he experience swelling, redness, drainage, change in color or pain in digit. 3. Patient to continue soft, supportive shoe gear 4. Patient to report any pedal injuries to medical professional  5. Follow up 3 months. Patient/POA to call should there be a concern in the interim.

## 2018-11-16 LAB — CUP PACEART REMOTE DEVICE CHECK
Battery Remaining Longevity: 101 mo
Battery Remaining Percentage: 95.5 %
Battery Voltage: 2.99 V
Brady Statistic AP VP Percent: 16 %
Brady Statistic AP VS Percent: 1 %
Brady Statistic AS VS Percent: 1 %
Brady Statistic RA Percent Paced: 15 %
Brady Statistic RV Percent Paced: 99 %
Date Time Interrogation Session: 20191124233626
Implantable Lead Implant Date: 20180911
Implantable Lead Implant Date: 20180911
Implantable Lead Location: 753859
Implantable Lead Location: 753860
Implantable Pulse Generator Implant Date: 20180911
Lead Channel Impedance Value: 400 Ohm
Lead Channel Pacing Threshold Amplitude: 0.5 V
Lead Channel Pacing Threshold Amplitude: 0.75 V
Lead Channel Pacing Threshold Pulse Width: 0.5 ms
Lead Channel Pacing Threshold Pulse Width: 0.5 ms
Lead Channel Sensing Intrinsic Amplitude: 12 mV
Lead Channel Sensing Intrinsic Amplitude: 2.8 mV
Lead Channel Setting Pacing Amplitude: 2 V
Lead Channel Setting Pacing Amplitude: 2.5 V
Lead Channel Setting Pacing Pulse Width: 0.5 ms
Lead Channel Setting Sensing Sensitivity: 4 mV
MDC IDC MSMT LEADCHNL RA IMPEDANCE VALUE: 410 Ohm
MDC IDC STAT BRADY AS VP PERCENT: 84 %
Pulse Gen Model: 2272
Pulse Gen Serial Number: 8940191

## 2018-11-30 ENCOUNTER — Encounter: Payer: Self-pay | Admitting: Internal Medicine

## 2018-12-04 ENCOUNTER — Other Ambulatory Visit: Payer: Self-pay | Admitting: Internal Medicine

## 2018-12-04 DIAGNOSIS — E1122 Type 2 diabetes mellitus with diabetic chronic kidney disease: Secondary | ICD-10-CM

## 2018-12-04 DIAGNOSIS — I1310 Hypertensive heart and chronic kidney disease without heart failure, with stage 1 through stage 4 chronic kidney disease, or unspecified chronic kidney disease: Secondary | ICD-10-CM

## 2018-12-04 DIAGNOSIS — H43391 Other vitreous opacities, right eye: Secondary | ICD-10-CM | POA: Diagnosis not present

## 2018-12-04 DIAGNOSIS — N183 Chronic kidney disease, stage 3 (moderate): Principal | ICD-10-CM

## 2018-12-04 DIAGNOSIS — E113313 Type 2 diabetes mellitus with moderate nonproliferative diabetic retinopathy with macular edema, bilateral: Secondary | ICD-10-CM | POA: Diagnosis not present

## 2018-12-04 DIAGNOSIS — I442 Atrioventricular block, complete: Secondary | ICD-10-CM

## 2018-12-04 DIAGNOSIS — H4321 Crystalline deposits in vitreous body, right eye: Secondary | ICD-10-CM | POA: Diagnosis not present

## 2018-12-04 LAB — HM DIABETES EYE EXAM

## 2018-12-10 ENCOUNTER — Ambulatory Visit: Payer: Self-pay

## 2018-12-10 DIAGNOSIS — E1122 Type 2 diabetes mellitus with diabetic chronic kidney disease: Secondary | ICD-10-CM

## 2018-12-10 DIAGNOSIS — N183 Chronic kidney disease, stage 3 unspecified: Secondary | ICD-10-CM

## 2018-12-10 DIAGNOSIS — I1310 Hypertensive heart and chronic kidney disease without heart failure, with stage 1 through stage 4 chronic kidney disease, or unspecified chronic kidney disease: Secondary | ICD-10-CM

## 2018-12-10 NOTE — Chronic Care Management (AMB) (Signed)
  Chronic Care Management   Outreach Note  12/10/2018 Name: Dylan [name]. MRN: [id number] DOB: 07/03/1941  Referred by: [name], MD Reason for referral : Care Coordination (Initial ourteach for CCM introduction )   An unsuccessful telephone outreach to Dylan [name], Dylan Bonito., was attempted today. Dylan [name] was referred to the case management team by Dr. [name] for assistance with Diabetes Mellitus II, non insulin dependent, CKD, stage III, Hypertensive heart and renal failure disease and Atherosclerosis of aorta.   Follow Up Plan: The CM team will reach out to the patient again over the next 2-3 days days.    [name], RN,CCM Care Management Coordinator [place] [facility]  Direct Phone: [phone]

## 2018-12-11 ENCOUNTER — Ambulatory Visit: Payer: Self-pay

## 2018-12-11 ENCOUNTER — Ambulatory Visit (INDEPENDENT_AMBULATORY_CARE_PROVIDER_SITE_OTHER): Payer: Medicare HMO

## 2018-12-11 DIAGNOSIS — E1122 Type 2 diabetes mellitus with diabetic chronic kidney disease: Secondary | ICD-10-CM | POA: Diagnosis not present

## 2018-12-11 DIAGNOSIS — I1310 Hypertensive heart and chronic kidney disease without heart failure, with stage 1 through stage 4 chronic kidney disease, or unspecified chronic kidney disease: Secondary | ICD-10-CM

## 2018-12-11 DIAGNOSIS — N183 Chronic kidney disease, stage 3 unspecified: Secondary | ICD-10-CM

## 2018-12-11 DIAGNOSIS — E1165 Type 2 diabetes mellitus with hyperglycemia: Secondary | ICD-10-CM | POA: Diagnosis not present

## 2018-12-11 NOTE — Chronic Care Management (AMB) (Signed)
  Chronic Care Management   Note  12/11/2018 Name: Ashford Clouse. MRN: [id number] DOB: 11/27/1940   Chronic Care Management   Outreach Note  12/11/2018 Name: Aundray Cartlidge. MRN: [id number] DOB: [birth year]  Referred by: [name], MD Reason for referral : Care Coordination (Initial outreach #2 Attempt for CCM intro)  Inbound call from spouse [name], listed as approved person to speak with, voice message states she is returning my call from yesterday, states I may call her back at anytime.   Second unsuccessful telephone outreach to Mr. Rudie Meyer, Brooke Bonito. was attempted today. Mr. Nidiffer was referred to the case management team by Dr. [name], MD for assistance with Diabetes Mellitus II, non insulin dependent, CKD, stage III, Hypertensive heart and renal failure disease and Atherosclerosis of aorta.    Follow up plan: The CM team will reach out to the patient again over the next 7-10 days days.   [name], RN,CCM Care Management Coordinator [place] [facility]  Direct Phone: [phone]

## 2018-12-12 ENCOUNTER — Ambulatory Visit: Payer: Self-pay

## 2018-12-12 ENCOUNTER — Ambulatory Visit: Payer: Medicare HMO

## 2018-12-12 DIAGNOSIS — E1122 Type 2 diabetes mellitus with diabetic chronic kidney disease: Secondary | ICD-10-CM | POA: Diagnosis not present

## 2018-12-12 DIAGNOSIS — E1165 Type 2 diabetes mellitus with hyperglycemia: Secondary | ICD-10-CM | POA: Diagnosis not present

## 2018-12-12 DIAGNOSIS — N183 Chronic kidney disease, stage 3 unspecified: Secondary | ICD-10-CM

## 2018-12-12 DIAGNOSIS — I1310 Hypertensive heart and chronic kidney disease without heart failure, with stage 1 through stage 4 chronic kidney disease, or unspecified chronic kidney disease: Secondary | ICD-10-CM

## 2018-12-12 NOTE — Patient Instructions (Signed)
Visit Information  Goals    . "He needs to work on his eating"     Current Barriers:  [name] Knowledge deficit related to Diabetes Mellitus disease process and Self Health Management . Nonadherence to dietary guidelines for Diabetic/low carb diet  Nurse Case Manager Clinical Goal(s): Over the next 30 days patient and caregiver will have increased knowledge related to dietary recommendations for DM.   Interventions:   Telephone outreach to patient/caregiver for CCM introduction   Plan to assess patient/caregiver current knowledge and understanding about his prescribed diet  Plan to assess for patient/caregiver understanding and comfort level with checking CBG's and provide education accordingly  Plan to assess for patient/caregiver knowledge of normal ranges for CBG and teach s/s of hypo/hyperglycemia; home action plan for s/s  Plan to assess for peripheral perfusion and neuropathy (check feet for temperature, pulses, color, and sensation and provide education accordingly)  Provide written education materials related to Diabetes management and diet  Face to Face encounter scheduled with patient/spouse on 12/12/18  Patient Self Care Activities:   Adherence to recommended Diabetic diet (spouse states the patient does not follow this diet and is making poor food choices)  Self monitor CBG's (patient is not doing)  *initial goal documentation    . "He's not sleeping at night and I have noticed some confusion at times"     Current Barriers:  [name] Knowledge deficit related to disturbed sleep pattern  Nurse Case Manager Clinical Goal(s): Over the next 30 days, patient/caregiver will have a better understanding of potential cause of insomnia/wandering and how to best manage at home.   Interventions:   Telephone outreach to patient/caregiver for CCM introduction   Plan to assess for potential cause for sleep disturbance  Plan to assess for risk factors that may contribute to sleep  disturbance  Offer patient/caregiver education/tips for improving nighttime sleep pattern  Face to Face encounter scheduled with patient/spouse on 12/12/18  Patient Self Care Activities:   Adherence to following prescribed health related treatment plan  Keep Face to Face visit with CCM team set for 12/12/18   *initial goal documentation    . "I'm worried he's not filling his pill box correctly"     Current Barriers:  . Polypharmacy . Impaired cognitive ability (per spouse pt has some short term memory loss/confusion)  Nurse Case Manager Clinical Goal(s): Over the next 30 days, patient/caregiver will transition to blister pill pack and patient will report taking his medications exactly as prescribed w/o missed doses.   Interventions:   Telephone outreach to patient/caregiver for CCM introduction  Assessed for established routines/medication management and ability for patient to self administer medication   Verbal education provided to spouse re: blister pack availability  Plan to make clinical pharmacy referral as appropriate  Patient Self Care Activities:  . Self fills medication pill box and self administers meds (per spouse patient uses a pill box, he fills himself, she has found pills on the floor)  *initial goal documentation    Mr. Dylan [name] was given information about Chronic Care Management services today including:  1. CCM service includes personalized support from designated clinical staff supervised by his physician, including individualized plan of care and coordination with other care providers 2. 24/7 contact phone numbers for assistance for urgent and routine care needs. 3. Service will only be billed when office clinical staff spend 20 minutes or more in a month to coordinate care. 4. Only one practitioner may furnish and bill the service in  a calendar month. 5. The patient may stop CCM services at any time (effective at the end of the  month) by phone call to the office staff. 6. The patient will be responsible for cost sharing (co-pay) of up to 20% of the service fee (after annual deductible is met).  Patient agreed to services and verbal consent obtained.   The patient verbalized understanding of instructions provided today and declined a print copy of patient instruction materials.   Face to Face appointment with CCM team member scheduled for: 12/12/18  [name], [facility] Care Management Coordinator [place] [facility]  Direct Phone: [phone]

## 2018-12-12 NOTE — Chronic Care Management (AMB) (Signed)
Chronic Care Management   Initial Visit Note  12/12/2018 Name: Dylan [name]. MRN: [id number] DOB: 22-Sep-1941  Referred by: Dylan Chard, MD Reason for referral : Chronic Care Management (Initial CCM face to face visit)   Subjective: "I would like to learn how to better manage my diabetes"  Objective: Lab Results  Component Value Date   HGBA1C 7.0 (H) 10/22/2018   HGBA1C 7.4 (H) 09/24/2018   HGBA1C 7.6 (A) 06/22/2018   Lab Results  Component Value Date   MICROALBUR 80 10/04/2018   LDLCALC 55 10/22/2018   CREATININE 2.15 (H) 10/22/2018   BP Readings from Last 3 Encounters:  10/22/18 110/64  10/04/18 138/64  10/04/18 138/64  .  Last Annual Wellness Visit Date: 10/04/2018 Next Scheduled Annual Wellness Visit Date: 10/17/2019  # ED visits/last 6 months: 1  # IP Hospitalizations/ last 6 months: 1   Assessment: Mr. Dylan [name]., is a 78 year old male who sees Dylan Chard, MD for primary care. Dr. Baird [name] asked the CCM team to consult the patient for assistance with chronic disease management related to type II Diabetes, CKD stage III, and Essential hypertension.   Review of patient status, including review of consultants reports, relevant laboratory and other test results, and collaboration with appropriate care team members and the patient's provider was performed as part of comprehensive patient evaluation and provision of chronic care management services.    I completed a face to face CCM visit today with the patient and his spouse Dylan [name].    Goals Addressed    . "He needs to work on his eating"       Current Barriers:  [name] Knowledge deficit related to Diabetes Mellitus disease process and Self Health Management . Nonadherence to dietary guidelines for Diabetic/low carb diet  Nurse Case Manager Clinical Goal(s): Over the next 30 days patient and caregiver will have increased knowledge related to dietary recommendations for DM.   Interventions:     Face to Face visit completed today with patient and spouse Dylan [name]   Assess patient/caregiver current knowledge and understanding about his prescribed diet  Assess for patient/caregiver understanding and comfort level with checking CBG's and provide education accordingly   Instructed patient on importance of monitoring and recording his CBG's; discussed best time to check sugars preprandial  Instructed patient/caregiver knowledge of normal ranges for CBG and teach s/s of hypo/hyperglycemia; home action plan for s/s (written education provided)  Instructed patient/caregiver on the importance of completing daily feet inspections to check for temperature, pulses, color, cracks or calluses and to report any impaired skin integrity to PCP or CCM team promptly  Instructed patient on the importance of staying active and implementing daily exercise routine  Offered to collaborate with Dr. Baird [name] re: outpatient PT to relearn in home exercises that may improve mobility and decrease pain to his cervical/thoracic/lumber spine (pt declines)  Encouraged patient to participate in water exercises to increase activity and improve ROM to joints/extremities (pt will consider using community pool when it warms up)  Provided written education materials related to Diabetes management and diet  Telephone follow up scheduled with patient  Patient Self Care Activities:   Verbalizes understanding of plan of care and instructions provided  Adherence to recommended Diabetic diet (spouse states the patient does not follow this diet and is making poor food choices)  Self monitor CBG's (patient is not doing)  Please see past updates related to this goal by clicking on the "Past Updates" button in the  selected goal     . "He's not sleeping at night and I have noticed some confusion at times"       Current Barriers:  [name] Knowledge deficit related to disturbed sleep pattern  Nurse Case Manager Clinical  Goal(s): Over the next 30 days, patient/caregiver will have a better understanding of potential cause of insomnia/wandering and how to best manage at home.   Interventions:   Face to Face CCM visit with patient and spouse Dylan [name]   Assessed for potential cause for sleep disturbance  Assessed for risk factors that may contribute to sleep disturbance  Assessed for change in cognitive function such as noticed confusion, wandering, and or memory loss  Instructed patient/caregiver education/tips for improving nighttime sleep pattern (avoid caffeine, avoid napping during the daytime hours, avoid sleeping with the TV turned on, try to follow a set bedtime/wake schedule)  Telephone CCM follow up call scheduled with patient   Patient Self Care Activities:   Verbalizes understanding of plan of care  Adherence to following prescribed health related treatment plan  Stay engaged with the CCM team until goals are met   Please see past updates related to this goal by clicking on the "Past Updates" button in the selected goal     . "I'm worried he's not filling his pill box correctly"       Current Barriers:  . Polypharmacy . Impaired cognitive ability (per spouse pt has some short term memory loss/confusion)  Nurse Case Manager Clinical Goal(s): Over the next 30 days, patient/caregiver will transition to blister pill pack and patient will report taking his medications exactly as prescribed w/o missed doses.   Interventions:   Face to Face CCM visit completed today with patient/spouse Dylan [name]  Assessed for established routines/medication management and ability for patient to self administer medication   Verbal education provided to patient/spouse re: blister pack availability (patient expressed interest and would like getting this set up)  Plan to collaborate with pharmacy to initiate blister pill pack for patient  Plan to make clinical pharmacy referral as appropriate  Patient  Self Care Activities:  . Self fills medication pill box and self administers meds (per spouse patient uses a pill box, he fills himself, she has found pills on the floor)  Please see past updates related to this goal by clicking on the "Past Updates" button in the selected goal      Telephone follow up appointment with CCM team member scheduled for:   12/27/18  Mr. Gumina was given information about Chronic Care Management services today including:  1. CCM service includes personalized support from designated clinical staff supervised by his physician, including individualized plan of care and coordination with other care providers 2. 24/7 contact phone numbers for assistance for urgent and routine care needs. 3. Service will only be billed when office clinical staff spend 20 minutes or more in a month to coordinate care. 4. Only one practitioner may furnish and bill the service in a calendar month. 5. The patient may stop CCM services at any time (effective at the end of the month) by phone call to the office staff. 6. The patient will be responsible for cost sharing (co-pay) of up to 20% of the service fee (after annual deductible is met).  Patient agreed to services and verbal consent obtained.    [name], RN,CCM Care Management Coordinator [place] [facility]  Direct Phone: [phone]

## 2018-12-12 NOTE — Patient Instructions (Addendum)
Visit Information  Goals    . "He needs to work on his eating"     Current Barriers:  [name] Knowledge deficit related to Diabetes Mellitus disease process and Self Health Management . Nonadherence to dietary guidelines for Diabetic/low carb diet  Nurse Case Manager Clinical Goal(s): Over the next 30 days patient and caregiver will have increased knowledge related to dietary recommendations for DM.   Interventions:   Face to Face visit completed today with patient and spouse [name]   Assess patient/caregiver current knowledge and understanding about his prescribed diet  Assess for patient/caregiver understanding and comfort level with checking CBG's and provide education accordingly   Instructed patient on importance of monitoring and recording his CBG's; discussed best time to check sugars preprandial  Instructed patient/caregiver knowledge of normal ranges for CBG and teach s/s of hypo/hyperglycemia; home action plan for s/s (written education provided)  Instructed patient/caregiver on the importance of completing daily feet inspections to check for temperature, pulses, color, cracks or calluses and to report any impaired skin integrity to PCP or CCM team promptly  Instructed patient on the importance of staying active and implementing daily exercise routine  Offered to collaborate with [name] re: outpatient PT to relearn in home exercises that may improve mobility and decrease pain to his cervical/thoracic/lumber spine (pt declines)  Encouraged patient to participate in water exercises to increase activity and improve ROM to joints/extremities (pt will consider using community pool when it warms up)  Provided written education materials related to Diabetes management and diet  Telephone follow up scheduled with patient  Patient Self Care Activities:   Verbalizes understanding of plan of care and instructions provided  Adherence to recommended Diabetic diet (spouse  states the patient does not follow this diet and is making poor food choices)  Self monitor CBG's (patient is not doing)  Please see past updates related to this goal by clicking on the "Past Updates" button in the selected goal     . "He's not sleeping at night and I have noticed some confusion at times"     Current Barriers:  [name] Knowledge deficit related to disturbed sleep pattern  Nurse Case Manager Clinical Goal(s): Over the next 30 days, patient/caregiver will have a better understanding of potential cause of insomnia/wandering and how to best manage at home.   Interventions:   Face to Face CCM visit with patient and spouse [name]   Assessed for potential cause for sleep disturbance  Assessed for risk factors that may contribute to sleep disturbance  Assessed for change in cognitive function such as noticed confusion, wandering, and or memory loss  Instructed patient/caregiver education/tips for improving nighttime sleep pattern (avoid caffeine, avoid napping during the daytime hours, avoid sleeping with the TV turned on, try to follow a set bedtime/wake schedule)  Telephone CCM follow up call scheduled with patient   Patient Self Care Activities:   Verbalizes understanding of plan of care  Adherence to following prescribed health related treatment plan  Stay engaged with the CCM team until goals are met   Please see past updates related to this goal by clicking on the "Past Updates" button in the selected goal     . "I'm worried he's not filling his pill box correctly"     Current Barriers:  . Polypharmacy . Impaired cognitive ability (per spouse pt has some short term memory loss/confusion)  Nurse Case Manager Clinical Goal(s): Over the next 30 days, patient/caregiver will transition to blister pill  pack and patient will report taking his medications exactly as prescribed w/o missed doses.   Interventions:   Face to Face CCM visit completed today with  patient/spouse [name]  Assessed for established routines/medication management and ability for patient to self administer medication   Verbal education provided to patient/spouse re: blister pack availability (patient expressed interest and would like getting this set up)  Plan to collaborate with pharmacy to initiate blister pill pack for patient  Plan to make clinical pharmacy referral as appropriate  Patient Self Care Activities:  . Self fills medication pill box and self administers meds (per spouse patient uses a pill box, he fills himself, she has found pills on the floor)  Please see past updates related to this goal by clicking on the "Past Updates" button in the selected goal      Education or Materials Provided:  Living Well with Diabetes booklet, Hypo/Hyperglycemia s/s sheet, Meal Planning guide, ADA Diabetes cook book, Know Your A1c sheet, [place] Diabetic nutrition/education class schedule, [facility] blue notebook/calendar  The patient verbalized understanding of instructions provided today and declined a print copy of patient instruction materials.   Telephone follow up appointment with CCM team member scheduled for: 12/27/2018  [name], [facility] Care Management Coordinator [place] [facility]  Direct Phone: [phone]

## 2018-12-12 NOTE — Chronic Care Management (AMB) (Signed)
Care Management Note   Dylan [name]. is a 78 y.o. year old male who sees Dylan Chard, MD for primary care. Dr. Baird [name] asked the CCM team to consult the patient for assistance with chronic disease management related to type II Diabetes Mellitus, CKD stage III, Essential Hypertension. Referral was placed.   Review of patient status, including review of consultants reports, relevant laboratory and other test results, and collaboration with appropriate care team members and the patient's provider was performed as part of comprehensive patient evaluation and provision of chronic care management services.  Telephone outreach to patient today to introduce CCM services. I spoke with spouse Dylan [name], listed as approved person to speak with.   Goals    . "He needs to work on his eating"     Current Barriers:  Dylan [name] Kitchen Knowledge deficit related to Diabetes Mellitus disease process and Self Health Management . Nonadherence to dietary guidelines for Diabetic/low carb diet  Nurse Case Manager Clinical Goal(s): Over the next 30 days patient and caregiver will have increased knowledge related to dietary recommendations for DM.   Interventions:   Telephone outreach to patient/caregiver for CCM introduction   Plan to assess patient/caregiver current knowledge and understanding about his prescribed diet  Plan to assess for patient/caregiver understanding and comfort level with checking CBG's and provide education accordingly  Plan to assess for patient/caregiver knowledge of normal ranges for CBG and teach s/s of hypo/hyperglycemia; home action plan for s/s  Plan to assess for peripheral perfusion and neuropathy (check feet for temperature, pulses, color, and sensation and provide education accordingly)  Provide written education materials related to Diabetes management and diet  Face to Face encounter scheduled with patient/spouse on 12/12/18  Patient Self Care Activities:   Adherence to  recommended Diabetic diet (spouse states the patient does not follow this diet and is making poor food choices)  Self monitor CBG's (patient is not doing)  *initial goal documentation    . "He's not sleeping at night and I have noticed some confusion at times"     Current Barriers:  Dylan [name] Kitchen Knowledge deficit related to disturbed sleep pattern  Nurse Case Manager Clinical Goal(s): Over the next 30 days, patient/caregiver will have a better understanding of potential cause of insomnia/wandering and how to best manage at home.   Interventions:   Telephone outreach to patient/caregiver for CCM introduction   Plan to assess for potential cause for sleep disturbance  Plan to assess for risk factors that may contribute to sleep disturbance  Offer patient/caregiver education/tips for improving nighttime sleep pattern  Face to Face encounter scheduled with patient/spouse on 12/12/18  Patient Self Care Activities:   Adherence to following prescribed health related treatment plan  Keep Face to Face visit with CCM team set for 12/12/18   *initial goal documentation    . "I'm worried he's not filling his pill box correctly"     Current Barriers:  . Polypharmacy . Impaired cognitive ability (per spouse pt has some short term memory loss/confusion)  Nurse Case Manager Clinical Goal(s): Over the next 30 days, patient/caregiver will transition to blister pill pack and patient will report taking his medications exactly as prescribed w/o missed doses.   Interventions:   Telephone outreach to patient/caregiver for CCM introduction  Assessed for established routines/medication management and ability for patient to self administer medication   Verbal education provided to spouse re: blister pack availability  Plan to make clinical pharmacy referral as appropriate  Patient Self Care Activities:  .  Self fills medication pill box and self administers meds (per spouse patient uses a pill box, he  fills himself, she has found pills on the floor)  *initial goal documentation    Dylan [name] and spouse Dylan [name] was given information about Chronic Care Management services today including:  1. CCM service includes personalized support from designated clinical staff supervised by his physician, including individualized plan of care and coordination with other care providers 2. 24/7 contact phone numbers for assistance for urgent and routine care needs. 3. Service will only be billed when office clinical staff spend 20 minutes or more in a month to coordinate care. 4. Only one practitioner may furnish and bill the service in a calendar month. 5. The patient may stop CCM services at any time (effective at the end of the month) by phone call to the office staff. 6. The patient will be responsible for cost sharing (co-pay) of up to 20% of the service fee (after annual deductible is met).  Patient agreed to services and verbal consent obtained.    Follow Up Plan: Face to Face appointment with CCM team member scheduled for: 12/12/18 12 noon   Dylan [name], [facility] Care Management Coordinator [place] [facility]  Direct Phone: [phone]

## 2018-12-16 ENCOUNTER — Other Ambulatory Visit: Payer: Self-pay | Admitting: Internal Medicine

## 2018-12-18 ENCOUNTER — Other Ambulatory Visit: Payer: Self-pay | Admitting: Internal Medicine

## 2018-12-18 ENCOUNTER — Ambulatory Visit: Payer: Self-pay

## 2018-12-18 DIAGNOSIS — N183 Chronic kidney disease, stage 3 unspecified: Secondary | ICD-10-CM

## 2018-12-18 DIAGNOSIS — E1122 Type 2 diabetes mellitus with diabetic chronic kidney disease: Secondary | ICD-10-CM

## 2018-12-18 DIAGNOSIS — E1165 Type 2 diabetes mellitus with hyperglycemia: Secondary | ICD-10-CM | POA: Diagnosis not present

## 2018-12-18 DIAGNOSIS — I1310 Hypertensive heart and chronic kidney disease without heart failure, with stage 1 through stage 4 chronic kidney disease, or unspecified chronic kidney disease: Secondary | ICD-10-CM

## 2018-12-18 NOTE — Chronic Care Management (AMB) (Addendum)
Chronic Care Management   Follow Up Note   12/18/2018 Name: Dylan [name]. MRN: [id number] DOB: 13-May-1941  Referred by: [name], MD Reason for referral : Chronic Care Management (FOLLOW-UP/COORDINATION OF SERVICES)   Subjective: "He's is eating better" "His last blood sugar was 117, yesterday morning"   Objective: Lab Results  Component Value Date   HGBA1C 7.0 (H) 10/22/2018   HGBA1C 7.4 (H) 09/24/2018   HGBA1C 7.6 (A) 06/22/2018   Lab Results  Component Value Date   MICROALBUR 80 10/04/2018   LDLCALC 55 10/22/2018   CREATININE 2.15 (H) 10/22/2018    Assessment:  Dylan Novosad. is a 78 y.o. year old male who sees [name], MD for primary care. [name] asked the CCM team to consult the patient for assistance with chronic disease management related to type II Diabetes Mellitus, CKD stage III, Essential Hypertension.   I followed up with patient's spouse, Dylan [name] today.   Visit Information  Goals    . "He needs to work on his eating"     Current Barriers:  [name] Knowledge deficit related to Diabetes Mellitus disease process and Self Health Management . Nonadherence to dietary guidelines for Diabetic/low carb diet  Nurse Case Manager Clinical Goal(s): Over the next 30 days patient and caregiver will have increased knowledge related to dietary recommendations for DM.   Interventions:   Telephone follow-up completed today with spouse Staci [name]   Assessed for patient/caregiver understanding and adherence to following prescribed ADA diet  Assessed for patient/caregiver understanding and adherence with checking/recording CBG's (spouse reported last glucose level from 12/17/18 was 117)  Positive reinforcement provided related to checking glucose levels more frequently and adhering to ADA diet (spouse states "he has eaten better this week")  Reinforced importance of monitoring portion sizes  Encouraged spouse to set alarms on patient's  phone as a reminder to check sugars  Telephone follow-up scheduled with patient/spouse in about 2 weeks  Confirmed patient/spouse has RN CM contact number  Patient Self Care Activities:   Verbalizes understanding of plan of care and instructions provided  Adherence to recommended Diabetic diet   Self monitor CBG's (per spouse, patient is monitoring more frequently)  Please see past updates related to this goal by clicking on the "Past Updates" button in the selected goal     . "I'm worried he's not filling his pill box correctly"     Current Barriers:  . Polypharmacy . Impaired cognitive ability (per spouse pt has some short term memory loss/confusion)  Nurse Case Manager Clinical Goal(s): Over the next 30 days, patient/caregiver will transition to blister pill pack and patient will report taking his medications exactly as prescribed w/o missed doses.   Interventions:   Telephone collaboration with [facility] pharmacy [phone], spoke with Shaunda, confirmed pill packaging system is available for patient (instructions received)  Telephone follow-up with spouse [name] today (instructions provided to take all patient medications except for prn meds, to [facility] pharmacy when ready for pharmacy preparation of pill packaging system)  Assessed for established routines/medication management and ability for patient to self administer medication   Discussed/reinforced patient's current medication time regimen for taking meds and answered questions per spouse  Inbound call from spouse [name], reports [facility] will not accept meds for pill packaging system  Outbound collaboration with [name], R.Ph.with [facility] pharmacy re: process for pill packaging (he advised misinformation was given earlier, he will have to call the central office and request this service on behalf of the patient)  Further telephone collaboration with spouse [name] [name] instructed to call [name], [place] to confirm the patient would like to  start the pill packaging process and he will initiate on his end, advised per pharmacist, new system will start next month with new fill)  (further advised if the patient would prefer to switch to another pharmacy who offers this service, I can assist) (she declines and prefers [facility] at this time)  Patient Self Care Activities:   Spouse verbalizes understanding related to instructions on how, where and when to take patient's meds to [facility] pharmacy for preparation of pill packaging system  Spouse verbalizes understanding about who to call for medication questions/concerns (pharmacy, PCP office or CCM team)   Please see past updates related to this goal by clicking on the "Past Updates" button in the selected goal      Telephone follow up appointment with CCM team member scheduled for:  In about 2 weeks   [name], [facility] Care Management Coordinator [place] [facility]  Direct Phone: [phone]

## 2018-12-18 NOTE — Patient Instructions (Addendum)
Visit Information   Goals    . "He needs to work on his eating"     Current Barriers:  [name] Knowledge deficit related to Diabetes Mellitus disease process and Self Health Management . Nonadherence to dietary guidelines for Diabetic/low carb diet  Nurse Case Manager Clinical Goal(s): Over the next 30 days patient and caregiver will have increased knowledge related to dietary recommendations for DM.   Interventions:   Telephone follow-up completed today with spouse Dylan [name]   Assessed for patient/caregiver understanding and adherence to following prescribed ADA diet  Assessed for patient/caregiver understanding and adherence with checking/recording CBG's (spouse reported last glucose level from 12/17/18 was 117)  Positive reinforcement provided related to checking glucose levels more frequently and adhering to ADA diet (spouse states "he has eaten better this week")  Reinforced importance of monitoring portion sizes  Encouraged spouse to set alarms on patient's phone as a reminder to check sugars  Telephone follow-up scheduled with patient/spouse in about 2 weeks  Confirmed patient/spouse has RN CM contact number  Patient Self Care Activities:   Verbalizes understanding of plan of care and instructions provided  Adherence to recommended Diabetic diet   Self monitor CBG's (per spouse, patient is monitoring more frequently)  Please see past updates related to this goal by clicking on the "Past Updates" button in the selected goal      . "I'm worried he's not filling his pill box correctly"     Current Barriers:  . Polypharmacy . Impaired cognitive ability (per spouse pt has some short term memory loss/confusion)  Nurse Case Manager Clinical Goal(s): Over the next 30 days, patient/caregiver will transition to blister pill pack and patient will report taking his medications exactly as prescribed w/o missed doses.   Interventions:   Telephone collaboration with [facility] [phone], spoke with Shaunda, confirmed pill packaging system is available for patient (instructions received)  Telephone follow-up with spouse Dylan [name] today (instructions provided to take all patient medications except for prn meds, to [facility] when ready for pharmacy preparation of pill packaging system)  Assessed for established routines/medication management and ability for patient to self administer medication   Discussed/reinforced patient's current medication time regimen for taking meds and answered questions per spouse  Inbound call from spouse Dylan [name], reports [facility] will not accept [facility] for pill packaging system  Outbound collaboration with [name], R.Ph.with [facility] pharmacy re: process for pill packaging (he advised misinformation was given earlier, he will have to call the central office and request this service on behalf of the patient)  Further telephone collaboration with spouse Dylan [name] Dylan [name] instructed to call [name], RPh to confirm the patient would like to start the pill packaging process and he will initiate on his end, advised per pharmacist, new system will start next month with new fill)  (further advised if the patient would prefer to switch to another pharmacy who offers this service, I can assist) (she declines and prefers [facility] at this time)  Patient Self Care Activities:   Spouse verbalizes understanding related to instructions on how, where and when to take patient's meds to [facility] for preparation of pill packaging system  Spouse verbalizes understanding about who to call for medication questions/concerns (pharmacy, PCP office or CCM team)   Please see past updates related to this goal by clicking on the "Past Updates" button in the selected goal          The patient verbalized understanding of instructions provided today and declined a print copy  of patient instruction materials.   Telephone follow up appointment with CCM team member scheduled for:  in about 2 weeks  [name], [facility] Care Management Coordinator [place] [facility]  Direct Phone: [phone]

## 2018-12-24 ENCOUNTER — Ambulatory Visit (INDEPENDENT_AMBULATORY_CARE_PROVIDER_SITE_OTHER): Payer: Medicare HMO | Admitting: *Deleted

## 2018-12-24 DIAGNOSIS — I442 Atrioventricular block, complete: Secondary | ICD-10-CM

## 2018-12-25 LAB — CUP PACEART REMOTE DEVICE CHECK
Battery Remaining Longevity: 102 mo
Battery Remaining Percentage: 95.5 %
Battery Voltage: 2.99 V
Brady Statistic AP VP Percent: 7 %
Brady Statistic AP VS Percent: 1 %
Brady Statistic AS VS Percent: 1 %
Brady Statistic RA Percent Paced: 6.1 %
Brady Statistic RV Percent Paced: 99 %
Date Time Interrogation Session: 20200224081913
Implantable Lead Implant Date: 20180911
Implantable Lead Implant Date: 20180911
Implantable Lead Location: 753859
Implantable Lead Location: 753860
Implantable Pulse Generator Implant Date: 20180911
Lead Channel Impedance Value: 440 Ohm
Lead Channel Pacing Threshold Amplitude: 0.75 V
Lead Channel Pacing Threshold Pulse Width: 0.5 ms
Lead Channel Pacing Threshold Pulse Width: 0.5 ms
Lead Channel Sensing Intrinsic Amplitude: 3.7 mV
Lead Channel Sensing Intrinsic Amplitude: 5.6 mV
Lead Channel Setting Pacing Amplitude: 2 V
Lead Channel Setting Pacing Amplitude: 2.5 V
Lead Channel Setting Pacing Pulse Width: 0.5 ms
Lead Channel Setting Sensing Sensitivity: 4 mV
MDC IDC MSMT LEADCHNL RA IMPEDANCE VALUE: 400 Ohm
MDC IDC MSMT LEADCHNL RV PACING THRESHOLD AMPLITUDE: 0.5 V
MDC IDC STAT BRADY AS VP PERCENT: 93 %
Pulse Gen Model: 2272
Pulse Gen Serial Number: 8940191

## 2018-12-27 ENCOUNTER — Telehealth: Payer: Medicare HMO

## 2019-01-01 ENCOUNTER — Telehealth: Payer: Medicare HMO

## 2019-01-01 ENCOUNTER — Ambulatory Visit: Payer: Self-pay

## 2019-01-01 DIAGNOSIS — N183 Chronic kidney disease, stage 3 unspecified: Secondary | ICD-10-CM

## 2019-01-01 DIAGNOSIS — E1122 Type 2 diabetes mellitus with diabetic chronic kidney disease: Secondary | ICD-10-CM

## 2019-01-01 DIAGNOSIS — I1310 Hypertensive heart and chronic kidney disease without heart failure, with stage 1 through stage 4 chronic kidney disease, or unspecified chronic kidney disease: Secondary | ICD-10-CM

## 2019-01-01 NOTE — Chronic Care Management (AMB) (Signed)
Chronic Care Management   Follow Up Note   01/01/2019 Name: Dylan [name]. MRN: [id number] DOB: November 04, 1940  Referred by: [name], MD Reason for referral : Chronic Care Management (TELEPHONE CCM FOLLOW UP )   Dylan [name]. is a 78 y.o. year old male who is a primary care patient of [name], MD. The CCM team was consulted for assistance with chronic disease management and care coordination needs.    Review of patient status, including review of consultants reports, relevant laboratory and other test results, and collaboration with appropriate care team members and the patient's provider was performed as part of comprehensive patient evaluation and provision of chronic care management services.    I spoke with patient's spouse [name] (listed as an approved person to speak with) for a CCM follow-up.   Goals Addressed    . "He needs to work on his eating"   On track    Current Barriers:  [name] Knowledge deficit related to Diabetes Mellitus disease process and Self Health Management . Nonadherence to dietary guidelines for Diabetic/low carb diet  Nurse Case Manager Clinical Goal(s): Over the next 30 days patient and caregiver will have increased knowledge related to dietary recommendations for DM.   Interventions:   Telephone follow-up completed today with spouse [name]   Assessed for patient adherence to following his prescribed ADA diet  Assessed for patient adherence with checking/recording CBG's (fasting CBG's reported; 2/17=114; 2/18=154; 2/19=148; 2/26=178; 3/1=202; 2/26=178; 3/1=202; 12/31/18 = 162)  Positive reinforcement provided related to checking glucose levels more frequently and adhering to ADA diet   Reinforced importance of monitoring portion sizes  Telephone follow-up scheduled   Notified wife of patient's next scheduled appointment with [name] set for 01/08/19 @2 :00 PM   Confirmed patient/spouse has RN CM contact  number  Patient Self Care Activities:   Verbalizes understanding of plan of care and instructions provided  Adherence to recommended Diabetic diet   Self monitor CBG's (per spouse, patient is monitoring more frequently)  Please see past updates related to this goal by clicking on the "Past Updates" button in the selected goal     . "He's not sleeping at night and I have noticed some confusion at times"   On track    Current Barriers:  [name] Knowledge deficit related to disturbed sleep pattern  Nurse Case Manager Clinical Goal(s): Over the next 30 days, patient/caregiver will have a better understanding of potential cause of insomnia/wandering and how to best manage at home.   Interventions:   Telephone follow-up completed today with spouse [name]  Assessed for progress towards improved sleep pattern (spouse states this has not improved)  Encouraged to discuss with PCP during next scheduled visit   Telephone CCM follow up call scheduled with patient/spouse   Patient Self Care Activities:   Verbalizes understanding of plan of care  Adherence to following prescribed health related treatment plan  Patient will discuss impaired sleep disturbance with [name] at next scheduled visit  Please see past updates related to this goal by clicking on the "Past Updates" button in the selected goal     . "I'm worried he's not filling his pill box correctly"   On track    Current Barriers:  . Polypharmacy . Impaired cognitive ability (per spouse pt has some short term memory loss/confusion)  Nurse Case Manager Clinical Goal(s): Over the next 30 days, patient/caregiver will transition to blister pill pack and patient will report taking his medications exactly  as prescribed w/o missed doses.   Interventions:   Telephone follow-up with spouse [name] today   Assessed for planned date for transition to blister pill packaging (spouse confirms April 16 th is the target date for new  pill packaging system)  Assessed for questions or concerns related to current medication regimen (spouse denies having questions)  Scheduled telephone CCM follow-up with spouse  Patient Self Care Activities:   Spouse verbalizes understanding related to instructions on how, where and when to [facility]  Spouse verbalizes understanding about who to call for medication questions/concerns (pharmacy, PCP office or CCM team)  Please see past updates related to this goal by clicking on the "Past Updates" button in the selected goal       The CM team will reach out to the patient again over the next 2-3 weeks.    [name], RN,CCM Care Management Coordinator [place] [facility]  Direct Phone: [phone]

## 2019-01-01 NOTE — Patient Instructions (Signed)
Visit Information  Goals Addressed    . "He needs to work on his eating"   On track    Current Barriers:  [name] Knowledge deficit related to Diabetes Mellitus disease process and Self Health Management . Nonadherence to dietary guidelines for Diabetic/low carb diet  Nurse Case Manager Clinical Goal(s): Over the next 30 days patient and caregiver will have increased knowledge related to dietary recommendations for DM.   Interventions:   Telephone follow-up completed today with spouse [name]   Assessed for patient adherence to following his prescribed ADA diet  Assessed for patient adherence with checking/recording CBG's (fasting CBG's reported; 2/17=114; 2/18=154; 2/19=148; 2/26=178; 3/1=202; 2/26=178; 3/1=202; 12/31/18 = 162)  Positive reinforcement provided related to checking glucose levels more frequently and adhering to ADA diet   Reinforced importance of monitoring portion sizes  Telephone follow-up scheduled   Notified wife of patient's next scheduled appointment with [name] set for 01/08/19 @2 :00 PM   Confirmed patient/spouse has RN CM contact number  Patient Self Care Activities:   Verbalizes understanding of plan of care and instructions provided  Adherence to recommended Diabetic diet   Self monitor CBG's (per spouse, patient is monitoring more frequently)  Please see past updates related to this goal by clicking on the "Past Updates" button in the selected goal     . "He's not sleeping at night and I have noticed some confusion at times"   On track    Current Barriers:  [name] Knowledge deficit related to disturbed sleep pattern  Nurse Case Manager Clinical Goal(s): Over the next 30 days, patient/caregiver will have a better understanding of potential cause of insomnia/wandering and how to best manage at home.   Interventions:   Telephone follow-up completed today with spouse [name]  Assessed for progress towards improved sleep pattern (spouse  states this has not improved)  Encouraged to discuss with PCP during next scheduled visit   Telephone CCM follow up call scheduled with patient/spouse   Patient Self Care Activities:   Verbalizes understanding of plan of care  Adherence to following prescribed health related treatment plan  Patient will discuss impaired sleep disturbance with [name] at next scheduled visit   Please see past updates related to this goal by clicking on the "Past Updates" button in the selected goal     . "I'm worried he's not filling his pill box correctly"   On track    Current Barriers:  . Polypharmacy . Impaired cognitive ability (per spouse pt has some short term memory loss/confusion)  Nurse Case Manager Clinical Goal(s): Over the next 30 days, patient/caregiver will transition to blister pill pack and patient will report taking his medications exactly as prescribed w/o missed doses.   Interventions:   Telephone follow-up with spouse [name] today   Assessed for planned date for transition to blister pill packaging (spouse confirms April 16 th is the target date for new pill packaging system)  Assessed for questions or concerns related to current medication regimen (spouse denies having questions)  Scheduled telephone CCM follow-up with spouse  Patient Self Care Activities:   Spouse verbalizes understanding related to instructions on how, where and when to [facility]  Spouse verbalizes understanding about who to call for medication questions/concerns (pharmacy, PCP office or CCM team)   Please see past updates related to this goal by clicking on the "Past Updates" button in the selected goal  The CM team will reach out to the patient again over the next 2-3 weeks.   [name], RN,CCM Care Management Coordinator [place] [facility]  Direct Phone: [phone]

## 2019-01-01 NOTE — Progress Notes (Signed)
Remote pacemaker transmission.   

## 2019-01-03 ENCOUNTER — Ambulatory Visit: Payer: Self-pay

## 2019-01-03 DIAGNOSIS — N183 Chronic kidney disease, stage 3 unspecified: Secondary | ICD-10-CM

## 2019-01-03 DIAGNOSIS — E1122 Type 2 diabetes mellitus with diabetic chronic kidney disease: Secondary | ICD-10-CM

## 2019-01-03 DIAGNOSIS — I1310 Hypertensive heart and chronic kidney disease without heart failure, with stage 1 through stage 4 chronic kidney disease, or unspecified chronic kidney disease: Secondary | ICD-10-CM

## 2019-01-03 NOTE — Chronic Care Management (AMB) (Signed)
  Chronic Care Management   Follow Up Note   01/03/2019 Name: Dylan [name]. MRN: [id number] DOB: January 29, 1941  Referred by: [name], MD Reason for referral : Chronic Care Management (FOLLOW-UP RETURN CALL TO PATIENT)   Dylan [name]. is a 78 y.o. year old male who is a primary care patient of [name], MD. The CCM team was consulted for assistance with chronic disease management and care coordination needs.    Voice message received from spouse [name] requesting a return phone call. I spoke with [name] by telephone today, re: patient's next scheduled follow-up with [name], patient and spouse would like to have a brief CCM Face to Face following [name] visit.    Face to Face appointment with CCM team member scheduled for: 01/21/19 11:15 AM (immediately following visit with [name])  *No Goals addressed during this call*   [name], RN,CCM Care Management Coordinator [place] [facility]  Direct Phone: [phone]

## 2019-01-08 ENCOUNTER — Ambulatory Visit: Payer: Medicare HMO | Admitting: Internal Medicine

## 2019-01-10 ENCOUNTER — Ambulatory Visit: Payer: Medicare HMO | Admitting: Podiatry

## 2019-01-14 ENCOUNTER — Ambulatory Visit (INDEPENDENT_AMBULATORY_CARE_PROVIDER_SITE_OTHER): Payer: Medicare HMO | Admitting: Podiatry

## 2019-01-14 ENCOUNTER — Encounter: Payer: Self-pay | Admitting: Podiatry

## 2019-01-14 ENCOUNTER — Other Ambulatory Visit: Payer: Self-pay

## 2019-01-14 DIAGNOSIS — M79674 Pain in right toe(s): Secondary | ICD-10-CM | POA: Diagnosis not present

## 2019-01-14 DIAGNOSIS — B351 Tinea unguium: Secondary | ICD-10-CM | POA: Diagnosis not present

## 2019-01-14 DIAGNOSIS — E1151 Type 2 diabetes mellitus with diabetic peripheral angiopathy without gangrene: Secondary | ICD-10-CM | POA: Diagnosis not present

## 2019-01-14 DIAGNOSIS — M79675 Pain in left toe(s): Secondary | ICD-10-CM | POA: Diagnosis not present

## 2019-01-14 NOTE — Patient Instructions (Signed)

## 2019-01-18 ENCOUNTER — Telehealth: Payer: Medicare HMO

## 2019-01-21 ENCOUNTER — Encounter: Payer: Self-pay | Admitting: Internal Medicine

## 2019-01-21 ENCOUNTER — Telehealth: Payer: Medicare HMO

## 2019-01-21 ENCOUNTER — Ambulatory Visit (INDEPENDENT_AMBULATORY_CARE_PROVIDER_SITE_OTHER): Payer: Medicare HMO | Admitting: Internal Medicine

## 2019-01-21 ENCOUNTER — Other Ambulatory Visit: Payer: Self-pay

## 2019-01-21 ENCOUNTER — Ambulatory Visit: Payer: Self-pay

## 2019-01-21 ENCOUNTER — Ambulatory Visit: Payer: Medicare HMO

## 2019-01-21 VITALS — BP 114/76 | HR 75 | Temp 97.5°F | Ht 68.0 in | Wt 207.4 lb

## 2019-01-21 DIAGNOSIS — N183 Chronic kidney disease, stage 3 unspecified: Secondary | ICD-10-CM

## 2019-01-21 DIAGNOSIS — M1A3790 Chronic gout due to renal impairment, unspecified ankle and foot, without tophus (tophi): Secondary | ICD-10-CM | POA: Diagnosis not present

## 2019-01-21 DIAGNOSIS — I1310 Hypertensive heart and chronic kidney disease without heart failure, with stage 1 through stage 4 chronic kidney disease, or unspecified chronic kidney disease: Secondary | ICD-10-CM

## 2019-01-21 DIAGNOSIS — Z950 Presence of cardiac pacemaker: Secondary | ICD-10-CM

## 2019-01-21 DIAGNOSIS — E6609 Other obesity due to excess calories: Secondary | ICD-10-CM | POA: Diagnosis not present

## 2019-01-21 DIAGNOSIS — I442 Atrioventricular block, complete: Secondary | ICD-10-CM | POA: Diagnosis not present

## 2019-01-21 DIAGNOSIS — E1122 Type 2 diabetes mellitus with diabetic chronic kidney disease: Secondary | ICD-10-CM

## 2019-01-21 DIAGNOSIS — Z6831 Body mass index (BMI) 31.0-31.9, adult: Secondary | ICD-10-CM | POA: Diagnosis not present

## 2019-01-21 NOTE — Chronic Care Management (AMB) (Signed)
  Chronic Care Management   Follow Up Note   01/21/2019 Name: Dylan [name]. MRN: [id number] DOB: February 04, 1941  Referred by: [name], MD Reason for referral : No chief complaint on file.   Dylan [name]. is a 78 y.o. year old male who is a primary care patient of [name], MD. The CCM team was consulted for assistance with chronic disease management and care coordination needs.    I spoke with the patient's spouse [name] by telephone today.     Goals Addressed    . "He needs to work on his eating"       Current Barriers:  [name] Knowledge deficit related to Diabetes Mellitus disease process and Self Health Management . Nonadherence to dietary guidelines for Diabetic/low carb diet  Nurse Case Manager Clinical Goal(s): Over the next 30 days patient and caregiver will have increased knowledge related to dietary recommendations for DM.   Interventions:   Telephone follow-up completed today with spouse [name]   Assessed for patient adherence to following his prescribed ADA diet (pt is only adhering some of the time)  Assessed for patient adherence with checking/recording CBG's (fasting CBG's reported; am sugars are running in 120's, pm sugars are running higher)  Reinforced importance of monitoring portion sizes  Advised spouse, I will not see them in the office today   Confirmed patient/spouse has RN CM contact number  Scheduled a CCM follow up call with spouse in a couple of weeks  Patient Self Care Activities:   Verbalizes understanding of plan of care and instructions provided  Adherence to recommended Diabetic diet   Self monitor CBG's (per spouse, patient is monitoring more frequently)  Please see past updates related to this goal by clicking on the "Past Updates" button in the selected goal     The CM team will reach out to the patient again over the next 7-14 days days.    [name], RN,CCM Care Management Coordinator [place]  [facility]  Direct Phone: [phone]

## 2019-01-21 NOTE — Patient Instructions (Signed)
Diabetes Mellitus and Nutrition, Adult  When you have diabetes (diabetes mellitus), it is very important to have healthy eating habits because your blood sugar (glucose) levels are greatly affected by what you eat and drink. Eating healthy foods in the appropriate amounts, at about the same times every day, can help you:  · Control your blood glucose.  · Lower your risk of heart disease.  · Improve your blood pressure.  · Reach or maintain a healthy weight.  Every person with diabetes is different, and each person has different needs for a meal plan. Your health care provider may recommend that you work with a diet and nutrition specialist (dietitian) to make a meal plan that is best for you. Your meal plan may vary depending on factors such as:  · The calories you need.  · The medicines you take.  · Your weight.  · Your blood glucose, blood pressure, and cholesterol levels.  · Your activity level.  · Other health conditions you have, such as heart or kidney disease.  How do carbohydrates affect me?  Carbohydrates, also called carbs, affect your blood glucose level more than any other type of food. Eating carbs naturally raises the amount of glucose in your blood. Carb counting is a method for keeping track of how many carbs you eat. Counting carbs is important to keep your blood glucose at a healthy level, especially if you use insulin or take certain oral diabetes medicines.  It is important to know how many carbs you can safely have in each meal. This is different for every person. Your dietitian can help you calculate how many carbs you should have at each meal and for each snack.  Foods that contain carbs include:  · Bread, cereal, rice, pasta, and crackers.  · Potatoes and corn.  · Peas, beans, and lentils.  · Milk and yogurt.  · Fruit and juice.  · Desserts, such as cakes, cookies, ice cream, and candy.  How does alcohol affect me?  Alcohol can cause a sudden decrease in blood glucose (hypoglycemia),  especially if you use insulin or take certain oral diabetes medicines. Hypoglycemia can be a life-threatening condition. Symptoms of hypoglycemia (sleepiness, dizziness, and confusion) are similar to symptoms of having too much alcohol.  If your health care provider says that alcohol is safe for you, follow these guidelines:  · Limit alcohol intake to no more than 1 drink per day for nonpregnant women and 2 drinks per day for men. One drink equals 12 oz of beer, 5 oz of wine, or 1½ oz of hard liquor.  · Do not drink on an empty stomach.  · Keep yourself hydrated with water, diet soda, or unsweetened iced tea.  · Keep in mind that regular soda, juice, and other mixers may contain a lot of sugar and must be counted as carbs.  What are tips for following this plan?    Reading food labels  · Start by checking the serving size on the "Nutrition Facts" label of packaged foods and drinks. The amount of calories, carbs, fats, and other nutrients listed on the label is based on one serving of the item. Many items contain more than one serving per package.  · Check the total grams (g) of carbs in one serving. You can calculate the number of servings of carbs in one serving by dividing the total carbs by 15. For example, if a food has 30 g of total carbs, it would be equal to 2   servings of carbs.  · Check the number of grams (g) of saturated and trans fats in one serving. Choose foods that have low or no amount of these fats.  · Check the number of milligrams (mg) of salt (sodium) in one serving. Most people should limit total sodium intake to less than 2,300 mg per day.  · Always check the nutrition information of foods labeled as "low-fat" or "nonfat". These foods may be higher in added sugar or refined carbs and should be avoided.  · Talk to your dietitian to identify your daily goals for nutrients listed on the label.  Shopping  · Avoid buying canned, premade, or processed foods. These foods tend to be high in fat, sodium,  and added sugar.  · Shop around the outside edge of the grocery store. This includes fresh fruits and vegetables, bulk grains, fresh meats, and fresh dairy.  Cooking  · Use low-heat cooking methods, such as baking, instead of high-heat cooking methods like deep frying.  · Cook using healthy oils, such as olive, canola, or sunflower oil.  · Avoid cooking with butter, cream, or high-fat meats.  Meal planning  · Eat meals and snacks regularly, preferably at the same times every day. Avoid going long periods of time without eating.  · Eat foods high in fiber, such as fresh fruits, vegetables, beans, and whole grains. Talk to your dietitian about how many servings of carbs you can eat at each meal.  · Eat 4-6 ounces (oz) of lean protein each day, such as lean meat, chicken, fish, eggs, or tofu. One oz of lean protein is equal to:  ? 1 oz of meat, chicken, or fish.  ? 1 egg.  ? ¼ cup of tofu.  · Eat some foods each day that contain healthy fats, such as avocado, nuts, seeds, and fish.  Lifestyle  · Check your blood glucose regularly.  · Exercise regularly as told by your health care provider. This may include:  ? 150 minutes of moderate-intensity or vigorous-intensity exercise each week. This could be brisk walking, biking, or water aerobics.  ? Stretching and doing strength exercises, such as yoga or weightlifting, at least 2 times a week.  · Take medicines as told by your health care provider.  · Do not use any products that contain nicotine or tobacco, such as cigarettes and e-cigarettes. If you need help quitting, ask your health care provider.  · Work with a counselor or diabetes educator to identify strategies to manage stress and any emotional and social challenges.  Questions to ask a health care provider  · Do I need to meet with a diabetes educator?  · Do I need to meet with a dietitian?  · What number can I call if I have questions?  · When are the best times to check my blood glucose?  Where to find more  information:  · American Diabetes Association: diabetes.org  · Academy of Nutrition and Dietetics: www.eatright.org  · National Institute of Diabetes and Digestive and Kidney Diseases (NIH): www.niddk.nih.gov  Summary  · A healthy meal plan will help you control your blood glucose and maintain a healthy lifestyle.  · Working with a diet and nutrition specialist (dietitian) can help you make a meal plan that is best for you.  · Keep in mind that carbohydrates (carbs) and alcohol have immediate effects on your blood glucose levels. It is important to count carbs and to use alcohol carefully.  This information is not intended to   replace advice given to you by your health care provider. Make sure you discuss any questions you have with your health care provider.  Document Released: 07/14/2005 Document Revised: 05/17/2017 Document Reviewed: 11/21/2016  Elsevier Interactive Patient Education © 2019 [employer]

## 2019-01-21 NOTE — Progress Notes (Signed)
Subjective:     Patient ID: Dylan [name]. , male    DOB: May 23, 1941 , 78 y.o.   MRN: [id number]   Chief Complaint  Patient presents with  . Diabetes  . Hypertension    HPI  Diabetes  He presents for his follow-up diabetic visit. He has type 2 diabetes mellitus. There are no hypoglycemic associated symptoms. Pertinent negatives for diabetes include no blurred vision and no chest pain. There are no hypoglycemic complications. Diabetic complications include nephropathy. Risk factors for coronary artery disease include diabetes mellitus, dyslipidemia, hypertension, sedentary lifestyle, male sex and obesity. He participates in exercise intermittently. His breakfast blood glucose is taken between 9-10 am. His breakfast blood glucose range is generally 140-180 mg/dl. An ACE inhibitor/angiotensin II receptor blocker is being taken. He does not see a podiatrist.Eye exam is current.  Hypertension  This is a chronic problem. The current episode started more than 1 year ago. The problem has been gradually improving since onset. The problem is controlled. Pertinent negatives include no blurred vision, chest pain, palpitations or shortness of breath.  Reports compliance with meds.    Past Medical History:  Diagnosis Date  . Aortic stenosis    mild AS 11/24/16 (peak grad 24, mean grad 11) Dr. [name]  . CHB (complete heart block) ([place]) 07/2017  . 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 Diabetes mellitus without complication ([place])   . Gout   . Hypertension   . 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 Peripheral arterial disease ([place])   . 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 Second degree AV block    Wenckebach; no indication for pacemaker as of 12/01/16 (Dr. [name])  . Vitamin B12 deficiency anemia 03/01/2018  . Vitamin D deficiency disease      Family History  Problem Relation Age of Onset  . Diabetes Mother   . Breast cancer Mother   . Arthritis  Mother   . Hypertension Father   . Diabetes Sister   . Diabetes Brother   . Diabetes Maternal Grandmother   . Diabetes Brother   . Diabetes Brother   . Diabetes Brother   . Diabetes Sister   . Diabetes Sister   . Diabetes Sister      Current Outpatient Medications:  .  allopurinol (ZYLOPRIM) 100 MG tablet, Take 100 mg by mouth daily. , Disp: , Rfl:  .  amLODipine (NORVASC) 5 MG tablet, TAKE 1 TABLET BY MOUTH EVERY DAY, Disp: 90 tablet, Rfl: 1 .  Ascorbic Acid (VITAMIN C) 1000 MG tablet, Take 1,000 mg by mouth daily., Disp: , Rfl:  .  aspirin 81 MG tablet, Take 81 mg by mouth 3 (three) times a week. , Disp: , Rfl:  .  carvedilol (COREG) 12.5 MG tablet, Take 1 tablet (12.5 mg total) by mouth 2 (two) times daily., Disp: 180 tablet, Rfl: 3 .  Cholecalciferol (VITAMIN D3) 5000 units CAPS, Take 5,000 Units by mouth every evening., Disp: , Rfl:  .  Coenzyme Q10 200 MG capsule, Take 200 mg by mouth daily., Disp: , Rfl:  .  COLCRYS 0.6 MG tablet, Take 1 tablet (0.6 mg total) by mouth daily., Disp: 90 tablet, Rfl: 1 .  Dulaglutide (TRULICITY) 1.5 XQ/1.1HE SOPN, Inject 1.5 mg into the skin once a week., Disp: 4 pen, Rfl: 1 .  glucose blood test strip, 1 each by Other route as needed for other. Use as instructed, Disp: , Rfl:  .  Nutritional Supplements (GRAPESEED EXTRACT)  500-50 MG CAPS, Take 500 mg by mouth daily., Disp: , Rfl:  .  pioglitazone (ACTOS) 45 MG tablet, Take 45 mg by mouth every other day. , Disp: , Rfl:  .  pravastatin (PRAVACHOL) 20 MG tablet, Take 20 mg by mouth daily. , Disp: , Rfl:  .  valsartan (DIOVAN) 320 MG tablet, TAKE 1 TABLET BY MOUTH EVERY DAY, Disp: 90 tablet, Rfl: 1 .  metFORMIN (GLUCOPHAGE) 1000 MG tablet, TAKE 1 TABLET BY MOUTH TWICE A DAY WITH MEALS (Patient taking differently: 1,000 mg daily. ), Disp: 180 tablet, Rfl: 1   No Known Allergies   Review of Systems  Constitutional: Negative.   Eyes: Negative for blurred vision.  Respiratory: Negative.  Negative  for shortness of breath.   Cardiovascular: Negative.  Negative for chest pain and palpitations.  Gastrointestinal: Negative.   Neurological: Negative.   Psychiatric/Behavioral: Negative.      Today's Vitals   01/21/19 1051  BP: 114/76  Pulse: 75  Temp: (!) 97.5 F (36.4 C)  TempSrc: Oral  Weight: 207 lb 6.4 oz (94.1 kg)  Height: _0  (1.727 m)  PainSc: 0-No pain   Body mass index is 31.54 kg/m.   Objective:  Physical Exam Vitals signs and nursing note reviewed.  Constitutional:      Appearance: Normal appearance.  Cardiovascular:     Rate and Rhythm: Normal rate and regular rhythm.     Heart sounds: Normal heart sounds.  Pulmonary:     Effort: Pulmonary effort is normal.     Breath sounds: Normal breath sounds.  Chest:       Comments: Pacemaker left anterior chest Skin:    General: Skin is warm.  Neurological:     General: No focal deficit present.     Mental Status: He is alert.  Psychiatric:        Mood and Affect: Mood normal.         Assessment And Plan:     1. Type 2 diabetes mellitus with stage 3 chronic kidney disease, without long-term current use of insulin ([place])  I will check labs as listed below. Importance of dietary and medication compliance was discussed with the patient.   - BMP8+EGFR - Hemoglobin A1c  2. Hypertensive heart and renal disease with renal failure, stage 1 through stage 4 or unspecified chronic kidney disease, without heart failure  Well controlled. He will continue with current meds. He is encouraged to avoid adding salt to his foods.  3. 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 He is also followed by cardiology.   4. Chronic gout due to renal impairment involving foot without tophus, unspecified laterality  He is not having any current complaints. I will check a uric acid level.   - Uric acid  5. Class 1 obesity due to excess calories with serious comorbidity and body mass index (BMI) of 31.0 to 31.9 in adult   Importance of achieving optimal weight to decrease risk of cardiovascular disease and cancers was discussed with the patient in full detail. She is encouraged to start slowly - start with 10 minutes twice daily at least three to four days per week and to gradually build to 30 minutes five days weekly. She was given tips to incorporate more activity into her daily routine - take stairs when possible, park farther away from her grocery stores, etc. .    Maximino Greenland, MD

## 2019-01-22 ENCOUNTER — Ambulatory Visit: Payer: Self-pay

## 2019-01-22 ENCOUNTER — Ambulatory Visit (INDEPENDENT_AMBULATORY_CARE_PROVIDER_SITE_OTHER): Payer: Medicare HMO

## 2019-01-22 VITALS — BP 128/76 | HR 74 | Temp 97.5°F | Ht 68.0 in | Wt 206.2 lb

## 2019-01-22 DIAGNOSIS — E1122 Type 2 diabetes mellitus with diabetic chronic kidney disease: Secondary | ICD-10-CM

## 2019-01-22 DIAGNOSIS — N183 Chronic kidney disease, stage 3 unspecified: Secondary | ICD-10-CM

## 2019-01-22 DIAGNOSIS — I1310 Hypertensive heart and chronic kidney disease without heart failure, with stage 1 through stage 4 chronic kidney disease, or unspecified chronic kidney disease: Secondary | ICD-10-CM | POA: Diagnosis not present

## 2019-01-22 DIAGNOSIS — R350 Frequency of micturition: Secondary | ICD-10-CM

## 2019-01-22 LAB — POCT URINALYSIS DIPSTICK
Bilirubin, UA: NEGATIVE
Glucose, UA: NEGATIVE
KETONES UA: NEGATIVE
Leukocytes, UA: NEGATIVE
Nitrite, UA: NEGATIVE
Protein, UA: POSITIVE — AB
RBC UA: NEGATIVE
SPEC GRAV UA: 1.01 (ref 1.010–1.025)
Urobilinogen, UA: 0.2 E.U./dL
pH, UA: 5.5 (ref 5.0–8.0)

## 2019-01-22 LAB — BMP8+EGFR
BUN/Creatinine Ratio: 15 (ref 10–24)
BUN: 19 mg/dL (ref 8–27)
CALCIUM: 9.8 mg/dL (ref 8.6–10.2)
CO2: 21 mmol/L (ref 20–29)
Chloride: 103 mmol/L (ref 96–106)
Creatinine, Ser: 1.28 mg/dL — ABNORMAL HIGH (ref 0.76–1.27)
GFR calc Af Amer: 62 mL/min/{1.73_m2} (ref >59)
GFR calc non Af Amer: 54 mL/min/{1.73_m2} — ABNORMAL LOW (ref >59)
Glucose: 173 mg/dL — ABNORMAL HIGH (ref 65–99)
Potassium: 5.1 mmol/L (ref 3.5–5.2)
Sodium: 141 mmol/L (ref 134–144)

## 2019-01-22 LAB — POCT UA - MICROALBUMIN
Creatinine, POC: 200 mg/dL
Microalbumin Ur, POC: 80 mg/L

## 2019-01-22 LAB — URIC ACID: Uric Acid: 6.6 mg/dL (ref 3.7–8.6)

## 2019-01-22 LAB — HEMOGLOBIN A1C
Est. average glucose Bld gHb Est-mCnc: 169 mg/dL
Hgb A1c MFr Bld: 7.5 % — ABNORMAL HIGH (ref 4.8–5.6)
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
## 2019-01-22 NOTE — Patient Instructions (Signed)
Visit Information  Goals Addressed    . "He needs to work on his eating"       Current Barriers:  [name] Knowledge deficit related to Diabetes Mellitus disease process and Self Health Management . Nonadherence to dietary guidelines for Diabetic/low carb diet  Nurse Case Manager Clinical Goal(s): Over the next 30 days patient and caregiver will have increased knowledge related to dietary recommendations for DM.   Interventions:   Telephone follow-up completed today with spouse [name]   Assessed for patient adherence to following his prescribed ADA diet (pt is only adhering some of the time)  Assessed for patient adherence with checking/recording CBG's (fasting CBG's reported; am sugars are running in 120's, pm sugars are running higher)  Reinforced importance of monitoring portion sizes  Advised spouse, I will not see them in the office today   Confirmed patient/spouse has RN CM contact number  Scheduled a CCM follow up call with spouse in a couple of weeks  Patient Self Care Activities:   Verbalizes understanding of plan of care and instructions provided  Adherence to recommended Diabetic diet   Self monitor CBG's (per spouse, patient is monitoring more frequently)  Please see past updates related to this goal by clicking on the "Past Updates" button in the selected goal      The patient verbalized understanding of instructions provided today and declined a print copy of patient instruction materials.   The CM team will reach out to the patient again over the next 7-14  days.   [name], RN,CCM Care Management Coordinator [place] [facility]  Direct Phone: [phone]
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
## 2019-01-22 NOTE — Chronic Care Management (AMB) (Signed)
  Chronic Care Management   Social Work Note  01/22/2019 Name: Dylan [name]. MRN: [id number] DOB: [birth year]  Dylan Ahr. is a 78 y.o. year old male who sees [name], MD for primary care. The CCM team was consulted for assistance with Chronic Disease Management.  SW received incoming call from [place] requesting SW involvement with patient ongoing chronic care needs. SW added self to the patients care team.  Follow Up Plan: SW will follow up with patient by phone over the next week.  Daneen Schick, BSW, CDP [facility] Care Management Social Worker [phone]  Total time spent performing care coordination and/or care management activities with the patient by phone or face to face = 10 minutes.

## 2019-01-22 NOTE — Addendum Note (Signed)
Addended by: [name] on: 01/22/2019 02:35 PM   Modules accepted: Orders

## 2019-01-22 NOTE — Addendum Note (Signed)
Addended by: [name] on: 01/22/2019 02:51 PM   Modules accepted: Orders

## 2019-01-23 ENCOUNTER — Ambulatory Visit: Payer: Self-pay

## 2019-01-23 ENCOUNTER — Encounter: Payer: Self-pay | Admitting: Podiatry

## 2019-01-23 ENCOUNTER — Other Ambulatory Visit: Payer: Self-pay

## 2019-01-23 DIAGNOSIS — N183 Chronic kidney disease, stage 3 unspecified: Secondary | ICD-10-CM

## 2019-01-23 DIAGNOSIS — E1122 Type 2 diabetes mellitus with diabetic chronic kidney disease: Secondary | ICD-10-CM | POA: Diagnosis not present

## 2019-01-23 DIAGNOSIS — I1310 Hypertensive heart and chronic kidney disease without heart failure, with stage 1 through stage 4 chronic kidney disease, or unspecified chronic kidney disease: Secondary | ICD-10-CM | POA: Diagnosis not present

## 2019-01-23 MED ORDER — DULAGLUTIDE 1.5 MG/0.5ML ~~LOC~~ SOAJ: 1.5000 mg | SUBCUTANEOUS | 1 refills | Status: DC
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
## 2019-01-23 NOTE — Progress Notes (Signed)
Subjective: Dylan [name]. presents today with painful, thick toenails 1-5 b/l that he cannot cut and which interfere with daily activities.  Pain is aggravated when wearing enclosed shoe gear and relieved with periodic professional debridement.  Wife states he won't allow her to moisturize his feet.  [name], MD is his PCP and last visit was 10/22/2018.   Current Outpatient Medications:  .  allopurinol (ZYLOPRIM) 100 MG tablet, Take 100 mg by mouth daily. , Disp: , Rfl:  .  amLODipine (NORVASC) 5 MG tablet, TAKE 1 TABLET BY MOUTH EVERY DAY, Disp: 90 tablet, Rfl: 1 .  Ascorbic Acid (VITAMIN C) 1000 MG tablet, Take 1,000 mg by mouth daily., Disp: , Rfl:  .  aspirin 81 MG tablet, Take 81 mg by mouth 3 (three) times a week. , Disp: , Rfl:  .  carvedilol (COREG) 12.5 MG tablet, Take 1 tablet (12.5 mg total) by mouth 2 (two) times daily., Disp: 180 tablet, Rfl: 3 .  Cholecalciferol (VITAMIN D3) 5000 units CAPS, Take 5,000 Units by mouth every evening., Disp: , Rfl:  .  Coenzyme Q10 200 MG capsule, Take 200 mg by mouth daily., Disp: , Rfl:  .  COLCRYS 0.6 MG tablet, Take 1 tablet (0.6 mg total) by mouth daily., Disp: 90 tablet, Rfl: 1 .  Dulaglutide (TRULICITY) 1.5 NA/3.5TD SOPN, Inject 1.5 mg into the skin once a week., Disp: 4 pen, Rfl: 1 .  glucose blood test strip, 1 each by Other route as needed for other. Use as instructed, Disp: , Rfl:  .  metFORMIN (GLUCOPHAGE) 1000 MG tablet, TAKE 1 TABLET BY MOUTH TWICE A DAY WITH MEALS (Patient taking differently: 1,000 mg daily. ), Disp: 180 tablet, Rfl: 1 .  Nutritional Supplements (GRAPESEED EXTRACT) 500-50 MG CAPS, Take 500 mg by mouth daily., Disp: , Rfl:  .  pioglitazone (ACTOS) 45 MG tablet, Take 45 mg by mouth every other day. , Disp: , Rfl:  .  pravastatin (PRAVACHOL) 20 MG tablet, Take 20 mg by mouth daily. , Disp: , Rfl:  .  valsartan (DIOVAN) 320 MG tablet, TAKE 1 TABLET BY MOUTH EVERY DAY, Disp: 90 tablet, Rfl: 1  No Known  Allergies  Objective:  Vascular Examination: Capillary refill time <3 seconds x 10 digits  Dorsalis pedis right 0/4; left 2/4   Posterior tibial pulses right 0/4; left 2/4  Digital hair absent x 10 digits  Skin temperature gradient warm to cool b/l  Dermatological Examination: Skin thin, shiny and atrophic b/l.  Skin is also dry and flaky b/l.  Toenails 1-5 b/l discolored, thick, dystrophic with subungual debris and pain with palpation to nailbeds due to thickness of nails.  Musculoskeletal: Muscle strength 5/5 to all LE muscle groups  HAV deformity left foot  No pain, crepitus or joint limitation noted with ROM.   Neurological: Sensation intact with 10 gram monofilament.  Vibratory sensation intact.  Assessment: 1. Painful onychomycosis toenails 1-5 b/l  2. Xerosis b/l 2. NIDDM with PAD   Plan: 1. Toenails 1-5 b/l were debrided in length and girth without iatrogenic bleeding. 2. Encouraged Dylan [name] to allow his wife to apply lotion to his feet once daily; either in the morning, after bath, or before bedtime. Patient agreed. 3. Patient to continue soft, supportive shoe gear daily. 4. Patient to report any pedal injuries to medical professional immediately. 5. Follow up 3 months.  6. Patient/POA to call should there be a concern in the interim.

## 2019-01-24 ENCOUNTER — Other Ambulatory Visit: Payer: Self-pay

## 2019-01-24 ENCOUNTER — Ambulatory Visit: Payer: Medicare HMO

## 2019-01-24 DIAGNOSIS — R350 Frequency of micturition: Secondary | ICD-10-CM
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
## 2019-01-25 LAB — URINE CULTURE: Organism ID, Bacteria: NO GROWTH

## 2019-01-30 ENCOUNTER — Ambulatory Visit: Payer: Self-pay

## 2019-01-30 ENCOUNTER — Telehealth: Payer: Medicare HMO

## 2019-01-30 ENCOUNTER — Other Ambulatory Visit: Payer: Self-pay

## 2019-01-30 DIAGNOSIS — I1310 Hypertensive heart and chronic kidney disease without heart failure, with stage 1 through stage 4 chronic kidney disease, or unspecified chronic kidney disease: Secondary | ICD-10-CM

## 2019-01-30 DIAGNOSIS — E1122 Type 2 diabetes mellitus with diabetic chronic kidney disease: Secondary | ICD-10-CM

## 2019-01-30 DIAGNOSIS — N183 Chronic kidney disease, stage 3 unspecified: Secondary | ICD-10-CM
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
## 2019-01-31 ENCOUNTER — Ambulatory Visit: Payer: Medicare HMO

## 2019-01-31 ENCOUNTER — Telehealth: Payer: Medicare HMO

## 2019-01-31 ENCOUNTER — Encounter: Payer: Self-pay | Admitting: Internal Medicine

## 2019-01-31 ENCOUNTER — Ambulatory Visit (INDEPENDENT_AMBULATORY_CARE_PROVIDER_SITE_OTHER): Payer: Medicare HMO | Admitting: Internal Medicine

## 2019-01-31 ENCOUNTER — Ambulatory Visit: Payer: Self-pay

## 2019-01-31 ENCOUNTER — Other Ambulatory Visit: Payer: Self-pay

## 2019-01-31 VITALS — BP 138/70 | HR 80 | Temp 98.1°F | Ht 68.2 in | Wt 201.4 lb

## 2019-01-31 DIAGNOSIS — N183 Chronic kidney disease, stage 3 unspecified: Secondary | ICD-10-CM

## 2019-01-31 DIAGNOSIS — R454 Irritability and anger: Secondary | ICD-10-CM | POA: Diagnosis not present

## 2019-01-31 DIAGNOSIS — R413 Other amnesia: Secondary | ICD-10-CM | POA: Diagnosis not present

## 2019-01-31 DIAGNOSIS — I1310 Hypertensive heart and chronic kidney disease without heart failure, with stage 1 through stage 4 chronic kidney disease, or unspecified chronic kidney disease: Secondary | ICD-10-CM

## 2019-01-31 DIAGNOSIS — E1122 Type 2 diabetes mellitus with diabetic chronic kidney disease: Secondary | ICD-10-CM

## 2019-01-31 DIAGNOSIS — I129 Hypertensive chronic kidney disease with stage 1 through stage 4 chronic kidney disease, or unspecified chronic kidney disease: Secondary | ICD-10-CM

## 2019-01-31 DIAGNOSIS — I10 Essential (primary) hypertension: Secondary | ICD-10-CM

## 2019-01-31 MED ORDER — CARVEDILOL 12.5 MG PO TABS: 12.5000 mg | ORAL_TABLET | Freq: Two times a day (BID) | ORAL | 3 refills | Status: DC
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
## 2019-02-01 ENCOUNTER — Telehealth: Payer: Medicare HMO

## 2019-02-01 ENCOUNTER — Ambulatory Visit: Payer: Self-pay

## 2019-02-01 LAB — TSH: TSH: 1.24 u[IU]/mL (ref 0.450–4.500)

## 2019-02-01 LAB — RPR: RPR Ser Ql: NONREACTIVE

## 2019-02-01 LAB — VITAMIN B12: Vitamin B-12: 246 pg/mL (ref 232–1245)

## 2019-02-01 NOTE — Chronic Care Management (AMB) (Signed)
  Chronic Care Management   Outreach Note  02/01/2019 Name: Chai Routh. MRN: [id number] DOB: 1941/02/13  Referred by: [name], MD Reason for referral : Care Coordination   An unsuccessful telephone outreach was attempted today to follow up on community resource needs. SW left a HIPAA compliant voice message requesting a return call.  Follow Up Plan: The CM team will reach out to the patient again over the next 5 days.    Daneen Schick, BSW, CDP [facility] Care Management Social Worker [phone]  Total time spent performing care coordination and/or care management activities with the patient by phone or face to face = 5 minutes.
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
## 2019-02-01 NOTE — Progress Notes (Signed)
Subjective:     Patient ID: Dylan [name]. , male    DOB: 1940/11/22 , 78 y.o.   MRN: [id number]   Chief Complaint  Patient presents with  . Agitation    HPI  He is brought in today to discuss "agitation". His partner has noticed mood changes. Her concerns were expressed to [facility], RN who suggested he come in for evaluation. He does admit to irritability. He reports that he gets irritated when his wife loses things. He reports she loses things often, and tries to blame it on him. He does not feel angry. He reports he does not lose things because he always puts his things in the same place. "everything has its place".  However, he does admit to having some memory issues. He does not think it is concerning. He does admit to feeling anxious at times. He is not able to communicate what triggers these symptoms.     Past Medical History:  Diagnosis Date  . Aortic stenosis    mild AS 11/24/16 (peak grad 24, mean grad 11) Dr. [name]  . CHB (complete heart block) ([place]) 07/2017  . 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 Diabetes mellitus without complication ([place])   . Gout   . Hypertension   . 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 Peripheral arterial disease ([place])   . 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 Second degree AV block    Wenckebach; no indication for pacemaker as of 12/01/16 (Dr. [name])  . Vitamin B12 deficiency anemia 03/01/2018  . Vitamin D deficiency disease      Family History  Problem Relation Age of Onset  . Diabetes Mother   . Breast cancer Mother   . Arthritis Mother   . Hypertension Father   . Diabetes Sister   . Diabetes Brother   . Diabetes Maternal Grandmother   . Diabetes Brother   . Diabetes Brother   . Diabetes Brother   . Diabetes Sister   . Diabetes Sister   . Diabetes Sister      Current Outpatient Medications:  .  allopurinol (ZYLOPRIM) 100 MG tablet, Take 100 mg by mouth daily. , Disp: , Rfl:  .   amLODipine (NORVASC) 5 MG tablet, TAKE 1 TABLET BY MOUTH EVERY DAY, Disp: 90 tablet, Rfl: 1 .  Ascorbic Acid (VITAMIN C) 1000 MG tablet, Take 1,000 mg by mouth daily., Disp: , Rfl:  .  aspirin 81 MG tablet, Take 81 mg by mouth 3 (three) times a week. , Disp: , Rfl:  .  Cholecalciferol (VITAMIN D3) 5000 units CAPS, Take 5,000 Units by mouth every evening., Disp: , Rfl:  .  Coenzyme Q10 200 MG capsule, Take 200 mg by mouth daily., Disp: , Rfl:  .  COLCRYS 0.6 MG tablet, Take 1 tablet (0.6 mg total) by mouth daily., Disp: 90 tablet, Rfl: 1 .  Dulaglutide (TRULICITY) 1.5 UK/0.2RK SOPN, Inject 1.5 mg into the skin once a week., Disp: 4 pen, Rfl: 1 .  glucose blood test strip, 1 each by Other route as needed for other. Use as instructed, Disp: , Rfl:  .  metFORMIN (GLUCOPHAGE) 1000 MG tablet, TAKE 1 TABLET BY MOUTH TWICE A DAY WITH MEALS (Patient taking differently: 1,000 mg daily. ), Disp: 180 tablet, Rfl: 1 .  Nutritional Supplements (GRAPESEED EXTRACT) 500-50 MG CAPS, Take 500 mg by mouth daily., Disp: , Rfl:  .  pravastatin (PRAVACHOL) 20 MG tablet, Take 20 mg by mouth daily. ,  Disp: , Rfl:  .  valsartan (DIOVAN) 320 MG tablet, TAKE 1 TABLET BY MOUTH EVERY DAY, Disp: 90 tablet, Rfl: 1 .  carvedilol (COREG) 12.5 MG tablet, Take 1 tablet (12.5 mg total) by mouth 2 (two) times daily., Disp: 180 tablet, Rfl: 3 .  pioglitazone (ACTOS) 45 MG tablet, Take 45 mg by mouth every other day. , Disp: , Rfl:    No Known Allergies   Review of Systems  Constitutional: Negative.   Respiratory: Negative.   Cardiovascular: Negative.   Gastrointestinal: Negative.   Neurological: Negative.   Psychiatric/Behavioral: Positive for agitation.     Today's Vitals   01/31/19 1127  BP: 138/70  Pulse: 80  Temp: 98.1 F (36.7 C)  TempSrc: Oral  SpO2: 97%  Weight: 201 lb 6.4 oz (91.4 kg)  Height: 5' 8.2" (1.732 m)   Body mass index is 30.44 kg/m.   Objective:  Physical Exam Vitals signs and nursing note  reviewed.  Constitutional:      Appearance: Normal appearance.  Cardiovascular:     Rate and Rhythm: Normal rate and regular rhythm.     Heart sounds: Normal heart sounds.  Pulmonary:     Effort: Pulmonary effort is normal.     Breath sounds: Normal breath sounds.  Skin:    General: Skin is warm.  Neurological:     General: No focal deficit present.     Mental Status: He is alert.  Psychiatric:        Mood and Affect: Mood normal.             6CIT Screen 01/31/2019 10/04/2018  What Year? 4 points 0 points  What month? 0 points 0 points  What time? 0 points 0 points  Count back from 20 0 points 0 points  Months in reverse 4 points 4 points  Repeat phrase 0 points 2 points  Total Score 8 6      Assessment And Plan:     1. Irritability  Perhaps this is due to anxiety. I will consider treatment once labwork is back. He may benefit from Seroquel vs. SSRI to help calm his mood.   2. Memory loss  I will check labs as listed below. I will make further recommendations once his labs are available for review. He has had low vitamin B12 in the past. He is agreeable to vitamin B12 injections if needed.   - Vitamin B12 - TSH - RPR  3. Hypertensive nephropathy  Controlled. He is encouraged to incorporate more activity into his daily routine to help him achieve optimal bp less than 130/80.    4. Chronic renal disease, stage III (HCC) Chronic. He is encouraged to stay well hydrated.   Maximino Greenland, MD    THE PATIENT IS ENCOURAGED TO PRACTICE SOCIAL DISTANCING DUE TO THE COVID-19 PANDEMIC.

## 2019-02-04 ENCOUNTER — Telehealth: Payer: Self-pay

## 2019-02-06 ENCOUNTER — Telehealth: Payer: Self-pay

## 2019-02-06 ENCOUNTER — Ambulatory Visit: Payer: Self-pay

## 2019-02-06 ENCOUNTER — Other Ambulatory Visit: Payer: Self-pay

## 2019-02-06 ENCOUNTER — Telehealth: Payer: Medicare HMO

## 2019-02-06 DIAGNOSIS — R413 Other amnesia: Secondary | ICD-10-CM

## 2019-02-06 DIAGNOSIS — N183 Chronic kidney disease, stage 3 unspecified: Secondary | ICD-10-CM

## 2019-02-06 DIAGNOSIS — E1122 Type 2 diabetes mellitus with diabetic chronic kidney disease: Secondary | ICD-10-CM
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
## 2019-02-07 ENCOUNTER — Ambulatory Visit (INDEPENDENT_AMBULATORY_CARE_PROVIDER_SITE_OTHER): Payer: Medicare HMO | Admitting: Internal Medicine

## 2019-02-07 ENCOUNTER — Encounter: Payer: Self-pay | Admitting: Internal Medicine

## 2019-02-07 VITALS — BP 112/68 | HR 77 | Temp 98.0°F | Ht 68.0 in | Wt 206.6 lb

## 2019-02-07 DIAGNOSIS — E538 Deficiency of other specified B group vitamins: Secondary | ICD-10-CM

## 2019-02-07 DIAGNOSIS — R269 Unspecified abnormalities of gait and mobility: Secondary | ICD-10-CM

## 2019-02-07 DIAGNOSIS — W19XXXA Unspecified fall, initial encounter: Secondary | ICD-10-CM | POA: Diagnosis not present

## 2019-02-07 MED ORDER — CYANOCOBALAMIN 1000 MCG/ML IJ SOLN
1000.0000 ug | Freq: Once | INTRAMUSCULAR | Status: AC
  Administered 2019-02-07: 13:00:00 1000 ug via INTRAMUSCULAR

## 2019-02-07 NOTE — Patient Instructions (Signed)
Fall Prevention in the Home, Adult  Falls can cause injuries. They can happen to people of all ages. There are many things you can do to make your home safe and to help prevent falls. Ask for help when making these changes, if needed.  What actions can I take to prevent falls?  General Instructions  · Use good lighting in all rooms. Replace any light bulbs that burn out.  · Turn on the lights when you go into a dark area. Use night-lights.  · Keep items that you use often in easy-to-reach places. Lower the shelves around your home if necessary.  · Set up your furniture so you have a clear path. Avoid moving your furniture around.  · Do not have throw rugs and other things on the floor that can make you trip.  · Avoid walking on wet floors.  · If any of your floors are uneven, fix them.  · Add color or contrast paint or tape to clearly mark and help you see:  ? Any grab bars or handrails.  ? First and last steps of stairways.  ? Where the edge of each step is.  · If you use a stepladder:  ? Make sure that it is fully opened. Do not climb a closed stepladder.  ? Make sure that both sides of the stepladder are locked into place.  ? Ask someone to hold the stepladder for you while you use it.  · If there are any pets around you, be aware of where they are.  What can I do in the bathroom?         · Keep the floor dry. Clean up any water that spills onto the floor as soon as it happens.  · Remove soap buildup in the tub or shower regularly.  · Use non-skid mats or decals on the floor of the tub or shower.  · Attach bath mats securely with double-sided, non-slip rug tape.  · If you need to sit down in the shower, use a plastic, non-slip stool.  · Install grab bars by the toilet and in the tub and shower. Do not use towel bars as grab bars.  What can I do in the bedroom?  · Make sure that you have a light by your bed that is easy to reach.  · Do not use any sheets or blankets that are too big for your bed. They should  not hang down onto the floor.  · Have a firm chair that has side arms. You can use this for support while you get dressed.  What can I do in the kitchen?  · Clean up any spills right away.  · If you need to reach something above you, use a strong step stool that has a grab bar.  · Keep electrical cords out of the way.  · Do not use floor polish or wax that makes floors slippery. If you must use wax, use non-skid floor wax.  What can I do with my stairs?  · Do not leave any items on the stairs.  · Make sure that you have a light switch at the top of the stairs and the bottom of the stairs. If you do not have them, ask someone to add them for you.  · Make sure that there are handrails on both sides of the stairs, and use them. Fix handrails that are broken or loose. Make sure that handrails are as long as the stairways.  ·   Install non-slip stair treads on all stairs in your home.  · Avoid having throw rugs at the top or bottom of the stairs. If you do have throw rugs, attach them to the floor with carpet tape.  · Choose a carpet that does not hide the edge of the steps on the stairway.  · Check any carpeting to make sure that it is firmly attached to the stairs. Fix any carpet that is loose or worn.  What can I do on the outside of my home?  · Use bright outdoor lighting.  · Regularly fix the edges of walkways and driveways and fix any cracks.  · Remove anything that might make you trip as you walk through a door, such as a raised step or threshold.  · Trim any bushes or trees on the path to your home.  · Regularly check to see if handrails are loose or broken. Make sure that both sides of any steps have handrails.  · Install guardrails along the edges of any raised decks and porches.  · Clear walking paths of anything that might make someone trip, such as tools or rocks.  · Have any leaves, snow, or ice cleared regularly.  · Use sand or salt on walking paths during winter.  · Clean up any spills in your garage right  away. This includes grease or oil spills.  What other actions can I take?  · Wear shoes that:  ? Have a low heel. Do not wear high heels.  ? Have rubber bottoms.  ? Are comfortable and fit you well.  ? Are closed at the toe. Do not wear open-toe sandals.  · Use tools that help you move around (mobility aids) if they are needed. These include:  ? Canes.  ? Walkers.  ? Scooters.  ? Crutches.  · Review your medicines with your doctor. Some medicines can make you feel dizzy. This can increase your chance of falling.  Ask your doctor what other things you can do to help prevent falls.  Where to find more information  · Centers for Disease Control and Prevention, STEADI: https://cdc.gov  · National Institute on Aging: https://go4life.nia.nih.gov  Contact a doctor if:  · You are afraid of falling at home.  · You feel weak, drowsy, or dizzy at home.  · You fall at home.  Summary  · There are many simple things that you can do to make your home safe and to help prevent falls.  · Ways to make your home safe include removing tripping hazards and installing grab bars in the bathroom.  · Ask for help when making these changes in your home.  This information is not intended to replace advice given to you by your health care provider. Make sure you discuss any questions you have with your health care provider.  Document Released: 08/13/2009 Document Revised: 06/01/2017 Document Reviewed: 06/01/2017  Elsevier Interactive Patient Education © 2019 Elsevier Inc.

## 2019-02-09 NOTE — Progress Notes (Signed)
Subjective:     Patient ID: Dylan [name]. , male    DOB: 08-15-1941 , 78 y.o.   MRN: [id number]   Chief Complaint  Patient presents with  . B12 Injection    fall    HPI  He is here today for his next B12 injection. He reports that he has noticed an improvement in his balance. However, he does admit that he had a fall last week.  He does not recall which day this occurred He was walking in his bedroom, states there was little space because there were suitcases in the way, and he lost his footing.  He reports he fell to the ground but did not injure himself.  He denies having throw rugs in his bedroom.     Past Medical History:  Diagnosis Date  . Aortic stenosis    mild AS 11/24/16 (peak grad 24, mean grad 11) Dr. [name]  . CHB (complete heart block) ([place]) 07/2017  . 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 Diabetes mellitus without complication ([place])   . Gout   . Hypertension   . 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 Peripheral arterial disease ([place])   . 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 Second degree AV block    Wenckebach; no indication for pacemaker as of 12/01/16 (Dr. [name])  . Vitamin B12 deficiency anemia 03/01/2018  . Vitamin D deficiency disease      Family History  Problem Relation Age of Onset  . Diabetes Mother   . Breast cancer Mother   . Arthritis Mother   . Hypertension Father   . Diabetes Sister   . Diabetes Brother   . Diabetes Maternal Grandmother   . Diabetes Brother   . Diabetes Brother   . Diabetes Brother   . Diabetes Sister   . Diabetes Sister   . Diabetes Sister      Current Outpatient Medications:  .  allopurinol (ZYLOPRIM) 100 MG tablet, Take 100 mg by mouth daily. , Disp: , Rfl:  .  amLODipine (NORVASC) 5 MG tablet, TAKE 1 TABLET BY MOUTH EVERY DAY, Disp: 90 tablet, Rfl: 1 .  Ascorbic Acid (VITAMIN C) 1000 MG tablet, Take 1,000 mg by mouth daily., Disp: , Rfl:  .  aspirin 81 MG tablet,  Take 81 mg by mouth 3 (three) times a week. , Disp: , Rfl:  .  carvedilol (COREG) 12.5 MG tablet, Take 1 tablet (12.5 mg total) by mouth 2 (two) times daily., Disp: 180 tablet, Rfl: 3 .  Cholecalciferol (VITAMIN D3) 5000 units CAPS, Take 5,000 Units by mouth every evening., Disp: , Rfl:  .  Coenzyme Q10 200 MG capsule, Take 200 mg by mouth daily., Disp: , Rfl:  .  COLCRYS 0.6 MG tablet, Take 1 tablet (0.6 mg total) by mouth daily., Disp: 90 tablet, Rfl: 1 .  Dulaglutide (TRULICITY) 1.5 YD/7.4JO SOPN, Inject 1.5 mg into the skin once a week., Disp: 4 pen, Rfl: 1 .  glucose blood test strip, 1 each by Other route as needed for other. Use as instructed, Disp: , Rfl:  .  MELATONIN PO, Take by mouth., Disp: , Rfl:  .  metFORMIN (GLUCOPHAGE) 1000 MG tablet, TAKE 1 TABLET BY MOUTH TWICE A DAY WITH MEALS (Patient taking differently: 1,000 mg daily. ), Disp: 180 tablet, Rfl: 1 .  pioglitazone (ACTOS) 45 MG tablet, Take 45 mg by mouth every other day. , Disp: , Rfl:  .  pravastatin (PRAVACHOL) 20  MG tablet, Take 20 mg by mouth daily. , Disp: , Rfl:  .  valsartan (DIOVAN) 320 MG tablet, TAKE 1 TABLET BY MOUTH EVERY DAY, Disp: 90 tablet, Rfl: 1   No Known Allergies   Review of Systems  Constitutional: Negative.   Respiratory: Negative.   Cardiovascular: Negative.   Gastrointestinal: Negative.   Neurological: Negative.   Psychiatric/Behavioral: Negative.      Today's Vitals   02/07/19 1215  BP: 112/68  Pulse: 77  Temp: 98 F (36.7 C)  TempSrc: Oral  Weight: 206 lb 9.6 oz (93.7 kg)  Height: 5\' 8"  (1.727 m)  PainSc: 0-No pain   Body mass index is 31.41 kg/m.   Objective:  Physical Exam Vitals signs and nursing note reviewed.  Constitutional:      Appearance: Normal appearance.  Cardiovascular:     Rate and Rhythm: Normal rate and regular rhythm.     Heart sounds: Normal heart sounds.  Pulmonary:     Effort: Pulmonary effort is normal.     Breath sounds: Normal breath sounds.   Skin:    General: Skin is warm.  Neurological:     General: No focal deficit present.     Mental Status: He is alert.  Psychiatric:        Mood and Affect: Mood normal.         Assessment And Plan:     1. Vitamin B12 deficiency  He was given vitamin B12 IM. He will rto in one week for his next injection.   - cyanocobalamin ((VITAMIN B-12)) injection 1,000 mcg  2. Fall, initial encounter  He is encouraged to remove all throw rugs in his home. He reports he has adequate lighting throughout his home.   - Ambulatory referral to Physical Therapy  3. Gait abnormality  - Ambulatory referral to Physical Therapy        Maximino Greenland, MD    THE PATIENT IS ENCOURAGED TO PRACTICE SOCIAL DISTANCING DUE TO THE COVID-19 PANDEMIC.

## 2019-02-11 ENCOUNTER — Ambulatory Visit: Payer: Self-pay

## 2019-02-11 DIAGNOSIS — N183 Type 2 diabetes mellitus with diabetic chronic kidney disease: Secondary | ICD-10-CM

## 2019-02-11 DIAGNOSIS — R413 Other amnesia: Secondary | ICD-10-CM

## 2019-02-11 DIAGNOSIS — R454 Irritability and anger: Secondary | ICD-10-CM

## 2019-02-11 DIAGNOSIS — E538 Deficiency of other specified B group vitamins: Secondary | ICD-10-CM

## 2019-02-11 DIAGNOSIS — E1122 Type 2 diabetes mellitus with diabetic chronic kidney disease: Secondary | ICD-10-CM
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
## 2019-02-13 ENCOUNTER — Encounter: Payer: Self-pay | Admitting: Internal Medicine

## 2019-02-14 ENCOUNTER — Other Ambulatory Visit: Payer: Self-pay

## 2019-02-14 ENCOUNTER — Encounter: Payer: Self-pay | Admitting: Internal Medicine

## 2019-02-14 ENCOUNTER — Ambulatory Visit (INDEPENDENT_AMBULATORY_CARE_PROVIDER_SITE_OTHER): Payer: Medicare HMO | Admitting: Internal Medicine

## 2019-02-14 VITALS — BP 124/78 | HR 83 | Temp 98.4°F | Ht 68.0 in | Wt 205.0 lb

## 2019-02-14 DIAGNOSIS — E6609 Other obesity due to excess calories: Secondary | ICD-10-CM

## 2019-02-14 DIAGNOSIS — E538 Deficiency of other specified B group vitamins: Secondary | ICD-10-CM

## 2019-02-14 DIAGNOSIS — Z6831 Body mass index (BMI) 31.0-31.9, adult: Secondary | ICD-10-CM | POA: Diagnosis not present

## 2019-02-14 MED ORDER — CYANOCOBALAMIN 1000 MCG/ML IJ SOLN
1000.0000 ug | Freq: Once | INTRAMUSCULAR | Status: AC
  Administered 2019-02-14: 1000 ug via INTRAMUSCULAR

## 2019-02-14 NOTE — Patient Instructions (Signed)

## 2019-02-15 NOTE — Progress Notes (Signed)
Subjective:     Patient ID: Dylan [name]. , male    DOB: 12/13/1940 , 78 y.o.   MRN: [id number]   Chief Complaint  Patient presents with  . B12 Injection    2nd weekly    HPI  He is here today for his second weekly vitamin B12 injection. He has not had any side effects. He has not had any falls since his last visit. He has yet to hear from physical therapy referral. No new concerns.     Past Medical History:  Diagnosis Date  . Aortic stenosis    mild AS 11/24/16 (peak grad 24, mean grad 11) Dr. [name]  . CHB (complete heart block) ([place]) 07/2017  . 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 Diabetes mellitus without complication ([place])   . Gout   . Hypertension   . 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 Peripheral arterial disease ([place])   . 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 Second degree AV block    Wenckebach; no indication for pacemaker as of 12/01/16 (Dr. [name])  . Vitamin B12 deficiency anemia 03/01/2018  . Vitamin D deficiency disease      Family History  Problem Relation Age of Onset  . Diabetes Mother   . Breast cancer Mother   . Arthritis Mother   . Hypertension Father   . Diabetes Sister   . Diabetes Brother   . Diabetes Maternal Grandmother   . Diabetes Brother   . Diabetes Brother   . Diabetes Brother   . Diabetes Sister   . Diabetes Sister   . Diabetes Sister      Current Outpatient Medications:  .  allopurinol (ZYLOPRIM) 100 MG tablet, Take 100 mg by mouth daily. , Disp: , Rfl:  .  amLODipine (NORVASC) 5 MG tablet, TAKE 1 TABLET BY MOUTH EVERY DAY, Disp: 90 tablet, Rfl: 1 .  Ascorbic Acid (VITAMIN C) 1000 MG tablet, Take 1,000 mg by mouth daily., Disp: , Rfl:  .  aspirin 81 MG tablet, Take 81 mg by mouth 3 (three) times a week. , Disp: , Rfl:  .  carvedilol (COREG) 12.5 MG tablet, Take 1 tablet (12.5 mg total) by mouth 2 (two) times daily., Disp: 180 tablet, Rfl: 3 .  Cholecalciferol (VITAMIN D3) 5000  units CAPS, Take 5,000 Units by mouth every evening., Disp: , Rfl:  .  Coenzyme Q10 200 MG capsule, Take 200 mg by mouth daily., Disp: , Rfl:  .  COLCRYS 0.6 MG tablet, Take 1 tablet (0.6 mg total) by mouth daily., Disp: 90 tablet, Rfl: 1 .  Dulaglutide (TRULICITY) 1.5 DJ/2.4QA SOPN, Inject 1.5 mg into the skin once a week., Disp: 4 pen, Rfl: 1 .  glucose blood test strip, 1 each by Other route as needed for other. Use as instructed, Disp: , Rfl:  .  MELATONIN PO, Take by mouth., Disp: , Rfl:  .  metFORMIN (GLUCOPHAGE) 1000 MG tablet, TAKE 1 TABLET BY MOUTH TWICE A DAY WITH MEALS (Patient taking differently: 1,000 mg daily. ), Disp: 180 tablet, Rfl: 1 .  pioglitazone (ACTOS) 45 MG tablet, Take 45 mg by mouth every other day. , Disp: , Rfl:  .  pravastatin (PRAVACHOL) 20 MG tablet, Take 20 mg by mouth daily. , Disp: , Rfl:  .  valsartan (DIOVAN) 320 MG tablet, TAKE 1 TABLET BY MOUTH EVERY DAY, Disp: 90 tablet, Rfl: 1   No Known Allergies   Review of Systems  Constitutional:  Negative.   Respiratory: Negative.   Cardiovascular: Negative.   Gastrointestinal: Negative.   Neurological: Negative.   Psychiatric/Behavioral: Negative.      Today's Vitals   02/14/19 0955  BP: 124/78  Pulse: 83  Temp: 98.4 F (36.9 C)  TempSrc: Oral  Weight: 205 lb (93 kg)  Height: 5\' 8"  (1.727 m)  PainSc: 0-No pain   Body mass index is 31.17 kg/m.   Objective:  Physical Exam Vitals signs and nursing note reviewed.  Constitutional:      Appearance: Normal appearance.  Cardiovascular:     Rate and Rhythm: Normal rate and regular rhythm.     Heart sounds: Normal heart sounds.  Pulmonary:     Effort: Pulmonary effort is normal.     Breath sounds: Normal breath sounds.  Skin:    General: Skin is warm.  Neurological:     General: No focal deficit present.     Mental Status: He is alert.  Psychiatric:        Mood and Affect: Mood normal.         Assessment And Plan:     1. Vitamin B12  deficiency  He was given vitamin B12 IM x1. He will rto next week for his 3rd injection.   - cyanocobalamin ((VITAMIN B-12)) injection 1,000 mcg  2. Class 1 obesity due to excess calories with serious comorbidity and body mass index (BMI) of 31.0 to 31.9 in adult  He is encouraged to strive to lose ten pounds over the next two months. He is encouraged to increase his daily activity.   Maximino Greenland, MD    THE PATIENT IS ENCOURAGED TO PRACTICE SOCIAL DISTANCING DUE TO THE COVID-19 PANDEMIC.

## 2019-02-20 ENCOUNTER — Telehealth: Payer: Medicare HMO

## 2019-02-20 ENCOUNTER — Ambulatory Visit: Payer: Self-pay

## 2019-02-20 DIAGNOSIS — E1122 Type 2 diabetes mellitus with diabetic chronic kidney disease: Secondary | ICD-10-CM

## 2019-02-20 DIAGNOSIS — N183 Chronic kidney disease, stage 3 (moderate): Secondary | ICD-10-CM

## 2019-02-20 DIAGNOSIS — E538 Deficiency of other specified B group vitamins: Secondary | ICD-10-CM

## 2019-02-20 DIAGNOSIS — R413 Other amnesia: Secondary | ICD-10-CM

## 2019-02-20 DIAGNOSIS — Z6831 Body mass index (BMI) 31.0-31.9, adult: Secondary | ICD-10-CM

## 2019-02-20 DIAGNOSIS — E6609 Other obesity due to excess calories: Secondary | ICD-10-CM

## 2019-02-20 NOTE — Patient Instructions (Signed)
Social Worker Visit Information   Materials Provided: No. Patient not reached.  Follow Up Plan: SW will follow up with patient by phone over the next week.   Daneen Schick, BSW, CDP [facility] Care Management Social Worker [phone]

## 2019-02-20 NOTE — Chronic Care Management (AMB) (Signed)
  Chronic Care Management   Social Work Note  02/20/2019 Name: Dayn Barich. MRN: [id number] DOB: 1941-02-17  Thorsten Climer. is a 78 y.o. year old male who sees [name], MD for primary care. The CCM team was consulted for assistance with chronic care management and care coordination of patient resource needs.   Unsuccessful outreach call to the patient to assess progression of patient stated goals. I left a HIPAA compliant voice message requesting a return call.  Follow Up Plan: SW will follow up with patient by phone over the next week.  Daneen Schick, BSW, CDP [facility] Care Management Social Worker [phone]  Total time spent performing care coordination and/or care management activities with the patient by phone or face to face = 3 minutes.

## 2019-02-21 ENCOUNTER — Ambulatory Visit: Payer: Medicare HMO | Admitting: Internal Medicine

## 2019-02-21 ENCOUNTER — Encounter: Payer: Self-pay | Admitting: Internal Medicine

## 2019-02-21 ENCOUNTER — Ambulatory Visit (INDEPENDENT_AMBULATORY_CARE_PROVIDER_SITE_OTHER): Payer: Medicare HMO | Admitting: Internal Medicine

## 2019-02-21 ENCOUNTER — Other Ambulatory Visit: Payer: Self-pay

## 2019-02-21 ENCOUNTER — Ambulatory Visit: Payer: Medicare HMO

## 2019-02-21 VITALS — BP 134/66 | HR 65 | Temp 98.4°F | Ht 68.0 in | Wt 201.2 lb

## 2019-02-21 DIAGNOSIS — I739 Peripheral vascular disease, unspecified: Secondary | ICD-10-CM | POA: Diagnosis not present

## 2019-02-21 DIAGNOSIS — R413 Other amnesia: Secondary | ICD-10-CM

## 2019-02-21 DIAGNOSIS — E538 Deficiency of other specified B group vitamins: Secondary | ICD-10-CM

## 2019-02-21 DIAGNOSIS — R269 Unspecified abnormalities of gait and mobility: Secondary | ICD-10-CM

## 2019-02-21 MED ORDER — CYANOCOBALAMIN 1000 MCG/ML IJ SOLN
1000.0000 ug | Freq: Once | INTRAMUSCULAR | Status: AC
  Administered 2019-02-21: 1000 ug via INTRAMUSCULAR

## 2019-02-21 NOTE — Chronic Care Management (AMB) (Signed)
  Chronic Care Management   Follow Up Note   02/21/2019 Name: Dylan [name]. MRN: [id number] DOB: 1941/07/11  Referred by: [name], MD Reason for referral : Chronic Care Management (f/u dementia/falls)   Dylan Ina. is a 78 y.o. year old male who is a primary care patient of [name], MD. The CCM team was consulted for assistance with chronic disease management and care coordination needs.    Review of patient status, including review of consultants reports, relevant laboratory and other test results, and collaboration with appropriate care team members and the patient's provider was performed as part of comprehensive patient evaluation and provision of chronic care management services.    I spoke with patient's spouse and primary caregiver, Dylan [name] by phone today. She stated she was unable to have a conversation and would like to talk at another time. She did provide the information below re: [name] fall history.   Goals Addressed    . "He had 2 recent falls at home"       Spouse stated  Fall Risk  02/21/2019 02/14/2019 02/07/2019 01/31/2019 01/21/2019  Falls in the past year? 0 0 1 1 0  Number falls in past yr: - - 0 0 -  Injury with Fall? - - 0 0 -  Risk for fall due to : - - - - -  Follow up - - - - -  Current Barriers:   Knowledge Deficits related to Falls/Fall Risk Reduction Strategies   Nurse Case Manager Clinical Goal(s):   Over the next 30 days, patient/spouse will report patient having no falls or near falls.   02/21/19 - spouse reports no falls since last conversation 02/06/19 with RNCM  Over the next 30 days, patient will have no ED visits or IP events secondary to falls.   02/21/19 - no ED visits or IP events since last conversation 02/06/19 with RNCM  Interventions:   CCM Telephone outreach to spouse Dylan [name] reports patient has NOT had falls since last conversation with RNCM on 02/06/19  Instructed spouse to notify the CCM team  and or [name] of any/all falls   Discussed plans with patient/spouse for ongoing care management follow up and provided patient with direct contact information for care management team  Scheduled CCM follow up with spouse   Patient Self Care Activities:   Spouse verbalizes understanding of education/instructions provided . Calls provider office for new concerns or questions  Please see past updates related to this goal by clicking on the "Past Updates" button in the selected goal       The CM team will reach out to the patient again over the next 14 days.   [place] Nurse Care Coordinator [facility] Care Management [phone]

## 2019-02-21 NOTE — Patient Instructions (Signed)
Atherosclerosis  Atherosclerosis is narrowing and hardening of the arteries. Arteries are blood vessels that carry blood from the heart to all parts of the body. This blood contains oxygen. Arteries can become narrow or clogged with a buildup of fat, cholesterol, calcium, and other substances (plaque). Plaque decreases the amount of blood that can flow through the artery. Atherosclerosis can affect any artery in the body, including:  Heart arteries (coronary artery disease). This may cause a heart attack.  Brain arteries. This may cause a stroke (cerebrovascular accident).  Leg, arm, and pelvis arteries (peripheral artery disease). This may cause pain and numbness.  Kidney arteries. This may cause kidney (renal) failure. Treatment may slow the disease and prevent further damage to the heart, brain, peripheral arteries, and kidneys. What are the causes? Atherosclerosis develops slowly over many years. The inner layers of your arteries become damaged and allow the gradual buildup of plaque. The exact cause of atherosclerosis is not fully understood. Symptoms of atherosclerosis do not occur until the artery becomes narrow or blocked. What increases the risk? The following factors may make you more likely to develop this condition:  High blood pressure.  High cholesterol.  Being middle-aged or older.  Having a family history of atherosclerosis.  Having high blood fats (triglycerides).  Diabetes.  Being overweight.  Smoking tobacco.  Not exercising enough (sedentary lifestyle).  Having a substance in the blood called C-reactive protein (CRP). This is a sign of increased levels of inflammation in the body.  Sleep apnea.  Being stressed.  Drinking too much alcohol. What are the signs or symptoms? This condition may not cause any symptoms. If you have symptoms, they are caused by damage to an area of your body that is not getting enough blood.  Coronary artery disease may cause  chest pain and shortness of breath.  Decreased blood supply to your brain may cause a stroke. Signs of a stroke may include sudden: ? Weakness on one side of the body. ? Confusion. ? Changes in vision. ? Inability to speak or understand speech. ? Loss of balance, coordination, or the ability to walk. ? Severe headache. ? Loss of consciousness.  Peripheral arterial disease may cause pain and numbness, often in the legs and hips.  Renal failure may cause fatigue, nausea, swelling, and itchy skin. How is this diagnosed? This condition is diagnosed based on your medical history and a physical exam. During the exam:  Your health care provider will: ? Check your pulse in different places. ? Listen for a "whooshing" sound over your arteries (bruit).  You may have tests, such as: ? Blood tests to check your levels of cholesterol, triglycerides, and CRP. ? Electrocardiogram (ECG) to check for heart damage. ? Chest X-ray to see if you have an enlarged heart, which is a sign of heart failure. ? Stress test to see how your heart reacts to exercise. ? Echocardiogram to get images of the inside of your heart. ? Ankle-brachial index to compare blood pressure in your arms to blood pressure in your ankles. ? Ultrasound of your peripheral arteries to check blood flow. ? CT scan to check for damage to your heart or brain. ? X-rays of blood vessels after dye has been injected (angiogram) to check blood flow. How is this treated? Treatment starts with lifestyle changes, which may include:  Changing your diet.  Losing weight.  Reducing stress.  Exercising and being physically active more regularly.  Not smoking. You may also need medicine to:  Lower   triglycerides and cholesterol.  Control blood pressure.  Prevent blood clots.  Lower inflammation in your body.  Control your blood sugar. Sometimes, surgery is needed to:  Remove plaque from an artery (endarterectomy).  Open or widen  a narrowed heart artery (angioplasty).  Create a new path for your blood with one of these procedures: ? Heart (coronary) artery bypass graft surgery. ? Peripheral artery bypass graft surgery. Follow these instructions at home: Eating and drinking   Eat a heart-healthy diet. Talk with your health care provider or a diet and nutrition specialist (dietitian) if you need help. A heart-healthy diet involves: ? Limiting unhealthy fats and increasing healthy fats. Some examples of healthy fats are olive oil and canola oil. ? Eating plant-based foods, such as fruits, vegetables, nuts, whole grains, and legumes (such as peas and lentils).  Limit alcohol intake to no more than 1 drink a day for nonpregnant women and 2 drinks a day for men. One drink equals 12 oz of beer, 5 oz of wine, or 1 oz of hard liquor. Lifestyle  Follow an exercise program as told by your health care provider.  Maintain a healthy weight. Lose weight if your health care provider says that you need to do that.  Rest when you are tired.  Learn to manage your stress.  Do not use any products that contain nicotine or tobacco, such as cigarettes and e-cigarettes. If you need help quitting, ask your health care provider.  Do not abuse drugs. General instructions  Take over-the-counter and prescription medicines only as told by your health care provider.  Manage other health conditions as told by your health care provider.  Keep all follow-up visits as told by your health care provider. This is important. Contact a health care provider if:  You have chest pain or discomfort. This includes squeezing chest pain that may feel like indigestion (angina).  You have shortness of breath.  You have an irregular heartbeat.  You have unexplained fatigue.  You have unexplained pain or numbness in an arm, leg, or hip.  You have nausea, swelling of your hands or feet, and itchy skin. Get help right away if:  You have any  symptoms of a heart attack, such as: ? Chest pain. ? Shortness of breath. ? Pain in your neck, jaw, arms, back, or stomach. ? Cold sweat. ? Nausea. ? Light-headedness.  You have any symptoms of a stroke. "BE FAST" is an easy way to remember the main warning signs of a stroke: ? B - Balance. Signs are dizziness, sudden trouble walking, or loss of balance. ? E - Eyes. Signs are trouble seeing or a sudden change in vision. ? F - Face. Signs are sudden weakness or numbness of the face, or the face or eyelid drooping on one side. ? A - Arms. Signs are weakness or numbness in an arm. This happens suddenly and usually on one side of the body. ? S - Speech. Signs are sudden trouble speaking, slurred speech, or trouble understanding what people say. ? T - Time. Time to call emergency services. Write down what time symptoms started.  You have other signs of a stroke, such as: ? A sudden, severe headache with no known cause. ? Nausea or vomiting. ? Seizure. These symptoms may represent a serious problem that is an emergency. Do not wait to see if the symptoms will go away. Get medical help right away. Call your local emergency services (911 in the [place] Do not drive yourself   to the hospital. Summary  Atherosclerosis is narrowing and hardening of the arteries.  Arteries can become narrow or clogged with a buildup of fat, cholesterol, calcium, and other substances (plaque).  This condition may not cause any symptoms. If you do have symptoms, they are caused by damage to an area of your body that is not getting enough blood.  Treatment may include lifestyle changes and medicines. In some cases, surgery is needed. This information is not intended to replace advice given to you by your health care provider. Make sure you discuss any questions you have with your health care provider. Document Released: 01/07/2004 Document Revised: 01/26/2018 Document Reviewed: 06/22/2017 Elsevier Interactive Patient  Education  2019 [employer].

## 2019-02-21 NOTE — Patient Instructions (Signed)
Visit Information   Fall Prevention in the Home, Adult Falls can cause injuries. They can happen to people of all ages. There are many things you can do to make your home safe and to help prevent falls. Ask for help when making these changes, if needed. What actions can I take to prevent falls? General Instructions  Use good lighting in all rooms. Replace any light bulbs that burn out.  Turn on the lights when you go into a dark area. Use night-lights.  Keep items that you use often in easy-to-reach places. Lower the shelves around your home if necessary.  Set up your furniture so you have a clear path. Avoid moving your furniture around.  Do not have throw rugs and other things on the floor that can make you trip.  Avoid walking on wet floors.  If any of your floors are uneven, fix them.  Add color or contrast paint or tape to clearly mark and help you see: ? Any grab bars or handrails. ? First and last steps of stairways. ? Where the edge of each step is.  If you use a stepladder: ? Make sure that it is fully opened. Do not climb a closed stepladder. ? Make sure that both sides of the stepladder are locked into place. ? Ask someone to hold the stepladder for you while you use it.  If there are any pets around you, be aware of where they are. What can I do in the bathroom?      Keep the floor dry. Clean up any water that spills onto the floor as soon as it happens.  Remove soap buildup in the tub or shower regularly.  Use non-skid mats or decals on the floor of the tub or shower.  Attach bath mats securely with double-sided, non-slip rug tape.  If you need to sit down in the shower, use a plastic, non-slip stool.  Install grab bars by the toilet and in the tub and shower. Do not use towel bars as grab bars. What can I do in the bedroom?  Make sure that you have a light by your bed that is easy to reach.  Do not use any sheets or blankets that are too big for your  bed. They should not hang down onto the floor.  Have a firm chair that has side arms. You can use this for support while you get dressed. What can I do in the kitchen?  Clean up any spills right away.  If you need to reach something above you, use a strong step stool that has a grab bar.  Keep electrical cords out of the way.  Do not use floor polish or wax that makes floors slippery. If you must use wax, use non-skid floor wax. What can I do with my stairs?  Do not leave any items on the stairs.  Make sure that you have a light switch at the top of the stairs and the bottom of the stairs. If you do not have them, ask someone to add them for you.  Make sure that there are handrails on both sides of the stairs, and use them. Fix handrails that are broken or loose. Make sure that handrails are as long as the stairways.  Install non-slip stair treads on all stairs in your home.  Avoid having throw rugs at the top or bottom of the stairs. If you do have throw rugs, attach them to the floor with carpet tape.  Choose  a carpet that does not hide the edge of the steps on the stairway.  Check any carpeting to make sure that it is firmly attached to the stairs. Fix any carpet that is loose or worn. What can I do on the outside of my home?  Use bright outdoor lighting.  Regularly fix the edges of walkways and driveways and fix any cracks.  Remove anything that might make you trip as you walk through a door, such as a raised step or threshold.  Trim any bushes or trees on the path to your home.  Regularly check to see if handrails are loose or broken. Make sure that both sides of any steps have handrails.  Install guardrails along the edges of any raised decks and porches.  Clear walking paths of anything that might make someone trip, such as tools or rocks.  Have any leaves, snow, or ice cleared regularly.  Use sand or salt on walking paths during winter.  Clean up any spills in  your garage right away. This includes grease or oil spills. What other actions can I take?  Wear shoes that: ? Have a low heel. Do not wear high heels. ? Have rubber bottoms. ? Are comfortable and fit you well. ? Are closed at the toe. Do not wear open-toe sandals.  Use tools that help you move around (mobility aids) if they are needed. These include: ? Canes. ? Walkers. ? Scooters. ? Crutches.  Review your medicines with your doctor. Some medicines can make you feel dizzy. This can increase your chance of falling. Ask your doctor what other things you can do to help prevent falls. Where to find more information  Centers for Disease Control and Prevention, STEADI: https://garcia.biz/  [employer] on Aging: BrainJudge.co.uk Contact a doctor if:  You are afraid of falling at home.  You feel weak, drowsy, or dizzy at home.  You fall at home. Summary  There are many simple things that you can do to make your home safe and to help prevent falls.  Ways to make your home safe include removing tripping hazards and installing grab bars in the bathroom.  Ask for help when making these changes in your home. This information is not intended to replace advice given to you by your health care provider. Make sure you discuss any questions you have with your health care provider. Document Released: 08/13/2009 Document Revised: 06/01/2017 Document Reviewed: 06/01/2017 Elsevier Interactive Patient Education  2019 [place]    . "He had 2 recent falls at home"       Spouse stated  Current Barriers:   Knowledge Deficits related to Falls/Fall Risk Reduction Strategies    Nurse Case Manager Clinical Goal(s):   Over the next 30 days, patient/spouse will report patient having no falls or near falls.   02/21/19 - spouse reports no falls since last conversation 02/06/19 with RNCM  Over the next 30 days, patient will have no ED visits or IP events  secondary to falls.   02/21/19 - no ED visits or IP events since last conversation 02/06/19 with RNCM  Interventions:   CCM Telephone outreach to spouse Suezanne Cheshire reports patient has NOT had falls since last conversation with RNCM on 02/06/19  Instructed spouse to notify the CCM team and or [name] of any/all falls   Discussed plans with patient/spouse for ongoing care management follow up and provided patient with direct contact information for care management team  Scheduled CCM follow up with spouse   Patient Self Care Activities:   Spouse verbalizes understanding of education/instructions provided . Calls provider office for new concerns or questions  Please see past updates related to this goal by clicking on the "Past Updates" button in the selected goal    The patient's spouse verbalized understanding of instructions provided today and declined a print copy of patient instruction materials.   The CM team will reach out to the patient again over the next 14 days.   [place] Nurse Care Coordinator [facility] [phone]

## 2019-02-23 NOTE — Progress Notes (Signed)
Subjective:     Patient ID: Dylan [name]. , male    DOB: 12/13/1940 , 78 y.o.   MRN: [id number]   Chief Complaint  Patient presents with  . B12 Injection    HPI  He is here today for his 4th weekly vitamin B12 injection. He has not had any adverse effects from these treatments. He thinks he may have more energy. He has no new concerns.     Past Medical History:  Diagnosis Date  . Aortic stenosis    mild AS 11/24/16 (peak grad 24, mean grad 11) Dr. [name]  . CHB (complete heart block) ([place]) 07/2017  . 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 Diabetes mellitus without complication ([place])   . Gout   . Hypertension   . 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 Peripheral arterial disease ([place])   . 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 Second degree AV block    Wenckebach; no indication for pacemaker as of 12/01/16 (Dr. [name])  . Vitamin B12 deficiency anemia 03/01/2018  . Vitamin D deficiency disease      Family History  Problem Relation Age of Onset  . Diabetes Mother   . Breast cancer Mother   . Arthritis Mother   . Hypertension Father   . Diabetes Sister   . Diabetes Brother   . Diabetes Maternal Grandmother   . Diabetes Brother   . Diabetes Brother   . Diabetes Brother   . Diabetes Sister   . Diabetes Sister   . Diabetes Sister      Current Outpatient Medications:  .  allopurinol (ZYLOPRIM) 100 MG tablet, Take 100 mg by mouth daily. , Disp: , Rfl:  .  amLODipine (NORVASC) 5 MG tablet, TAKE 1 TABLET BY MOUTH EVERY DAY, Disp: 90 tablet, Rfl: 1 .  Ascorbic Acid (VITAMIN C) 1000 MG tablet, Take 1,000 mg by mouth daily., Disp: , Rfl:  .  aspirin 81 MG tablet, Take 81 mg by mouth 3 (three) times a week. , Disp: , Rfl:  .  carvedilol (COREG) 12.5 MG tablet, Take 1 tablet (12.5 mg total) by mouth 2 (two) times daily., Disp: 180 tablet, Rfl: 3 .  Cholecalciferol (VITAMIN D3) 5000 units CAPS, Take 5,000 Units by mouth every  evening., Disp: , Rfl:  .  Coenzyme Q10 200 MG capsule, Take 200 mg by mouth daily., Disp: , Rfl:  .  COLCRYS 0.6 MG tablet, Take 1 tablet (0.6 mg total) by mouth daily., Disp: 90 tablet, Rfl: 1 .  Dulaglutide (TRULICITY) 1.5 GE/3.6OQ SOPN, Inject 1.5 mg into the skin once a week., Disp: 4 pen, Rfl: 1 .  glucose blood test strip, 1 each by Other route as needed for other. Use as instructed, Disp: , Rfl:  .  MELATONIN PO, Take by mouth., Disp: , Rfl:  .  metFORMIN (GLUCOPHAGE) 1000 MG tablet, TAKE 1 TABLET BY MOUTH TWICE A DAY WITH MEALS (Patient taking differently: 1,000 mg daily. ), Disp: 180 tablet, Rfl: 1 .  pioglitazone (ACTOS) 45 MG tablet, Take 45 mg by mouth every other day. , Disp: , Rfl:  .  pravastatin (PRAVACHOL) 20 MG tablet, Take 20 mg by mouth daily. , Disp: , Rfl:  .  valsartan (DIOVAN) 320 MG tablet, TAKE 1 TABLET BY MOUTH EVERY DAY, Disp: 90 tablet, Rfl: 1   No Known Allergies   Review of Systems  Constitutional: Negative.   Respiratory: Negative.   Cardiovascular: Negative.   Gastrointestinal:  Negative.   Neurological: Negative.   Psychiatric/Behavioral: Negative.      Today's Vitals   02/21/19 1218  BP: 134/66  Pulse: 65  Temp: 98.4 F (36.9 C)  TempSrc: Oral  Weight: 201 lb 3.2 oz (91.3 kg)  Height: 5\' 8"  (1.727 m)  PainSc: 0-No pain   Body mass index is 30.59 kg/m.   Objective:  Physical Exam Vitals signs and nursing note reviewed.  Constitutional:      Appearance: Normal appearance.  Cardiovascular:     Rate and Rhythm: Normal rate and regular rhythm.     Heart sounds: Normal heart sounds.  Pulmonary:     Effort: Pulmonary effort is normal.     Breath sounds: Normal breath sounds.  Skin:    General: Skin is warm.  Neurological:     General: No focal deficit present.     Mental Status: He is alert.  Psychiatric:        Mood and Affect: Mood normal.         Assessment And Plan:     1. Vitamin B12 deficiency  He was given vit b12 IM x1.  He will rto in four weeks for re-evaluation. I will check B12 level at that time.   - cyanocobalamin ((VITAMIN B-12)) injection 1,000 mcg  2. Peripheral arterial disease (HCC)  Chronic. Importance of statin compliance and regular exercise was discussed with the patient. He is encouraged to start with walking his driveway. He is encouraged to try walking driveway two times daily.   Maximino Greenland, MD    THE PATIENT IS ENCOURAGED TO PRACTICE SOCIAL DISTANCING DUE TO THE COVID-19 PANDEMIC.

## 2019-02-27 ENCOUNTER — Telehealth: Payer: Self-pay

## 2019-02-27 ENCOUNTER — Ambulatory Visit (INDEPENDENT_AMBULATORY_CARE_PROVIDER_SITE_OTHER): Payer: Medicare HMO

## 2019-02-27 DIAGNOSIS — N183 Chronic kidney disease, stage 3 (moderate): Secondary | ICD-10-CM

## 2019-02-27 DIAGNOSIS — E538 Deficiency of other specified B group vitamins: Secondary | ICD-10-CM

## 2019-02-27 DIAGNOSIS — E1122 Type 2 diabetes mellitus with diabetic chronic kidney disease: Secondary | ICD-10-CM | POA: Diagnosis not present

## 2019-02-27 DIAGNOSIS — R413 Other amnesia: Secondary | ICD-10-CM

## 2019-02-27 DIAGNOSIS — I1310 Hypertensive heart and chronic kidney disease without heart failure, with stage 1 through stage 4 chronic kidney disease, or unspecified chronic kidney disease: Secondary | ICD-10-CM

## 2019-02-27 NOTE — Chronic Care Management (AMB) (Signed)
  Chronic Care Management   Outreach Note  02/27/2019 Name: Dylan [name]. MRN: [id number] DOB: Dec 20, 1940  Referred by: [name], MD Reason for referral : Care Coordination   A second unsuccessful telephone outreach was attempted today. The patient was referred to the case management team for assistance with chronic care management and care coordination.   Follow Up Plan: A HIPPA compliant phone message was left for the patient providing contact information and requesting a return call.  The CM team will reach out to the patient again over the next 7-10 days.   Daneen Schick, BSW, CDP [facility] / [facility] Care Management Social Worker [phone]  Total time spent performing care coordination and/or care management activities with the patient by phone or face to face = 3 minutes.

## 2019-03-01 ENCOUNTER — Encounter: Payer: Self-pay | Admitting: Internal Medicine

## 2019-03-04 ENCOUNTER — Telehealth: Payer: Medicare HMO

## 2019-03-04 ENCOUNTER — Other Ambulatory Visit: Payer: Self-pay

## 2019-03-04 ENCOUNTER — Ambulatory Visit (INDEPENDENT_AMBULATORY_CARE_PROVIDER_SITE_OTHER): Payer: Medicare HMO

## 2019-03-04 DIAGNOSIS — N183 Chronic kidney disease, stage 3 (moderate): Secondary | ICD-10-CM

## 2019-03-04 DIAGNOSIS — E1122 Type 2 diabetes mellitus with diabetic chronic kidney disease: Secondary | ICD-10-CM | POA: Diagnosis not present

## 2019-03-04 DIAGNOSIS — I1310 Hypertensive heart and chronic kidney disease without heart failure, with stage 1 through stage 4 chronic kidney disease, or unspecified chronic kidney disease: Secondary | ICD-10-CM | POA: Diagnosis not present

## 2019-03-04 DIAGNOSIS — R269 Unspecified abnormalities of gait and mobility: Secondary | ICD-10-CM

## 2019-03-04 DIAGNOSIS — R413 Other amnesia: Secondary | ICD-10-CM
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
## 2019-03-06 ENCOUNTER — Ambulatory Visit: Payer: Medicare HMO

## 2019-03-06 DIAGNOSIS — N183 Chronic kidney disease, stage 3 (moderate): Secondary | ICD-10-CM

## 2019-03-06 DIAGNOSIS — E538 Deficiency of other specified B group vitamins: Secondary | ICD-10-CM

## 2019-03-06 DIAGNOSIS — E1122 Type 2 diabetes mellitus with diabetic chronic kidney disease: Secondary | ICD-10-CM

## 2019-03-06 DIAGNOSIS — R413 Other amnesia: Secondary | ICD-10-CM

## 2019-03-06 DIAGNOSIS — N182 Chronic kidney disease, stage 2 (mild): Secondary | ICD-10-CM

## 2019-03-06 NOTE — Patient Instructions (Signed)
Social Worker Visit Information  Goals we discussed today:  Goals Addressed            This Visit's Progress   . "He needs to be more active"       Fiance Stated Current Barriers:  . Social Isolation . Lacks knowledge of community resource: Nurse, adult  . COVID-19  Clinical Social Work Clinical Goal(s):  [name] Over the next 60 days, patient will work with SW to address needs related to engagement in community programs  Interventions: . CCM SW interviewed the patients fiance [name] who the patient has given permission to speak with . Educated [name] on local senior centers and activities available to the older adult population . Reviewed current concerns of participating in any public events due to GFQMK-10 . Developed a plan to follow up with the patient and Mrs Arsenio Loader in the next month to re-evaluate the status of COVID-19 and how it is impacting the community  Patient Self Care Activities:  . Attends all scheduled provider appointments . Performs ADL's independently . Calls provider office for new concerns or questions  Initial goal documentation         Materials Provided: No: Patient declined  Follow Up Plan: SW will follow up with patient by phone over the next month   Daneen Schick, BSW, CDP [facility] Care Management Social Worker [phone]

## 2019-03-06 NOTE — Chronic Care Management (AMB) (Signed)
  Chronic Care Management    Clinical Social Work Follow Up Note  03/06/2019 Name: Dylan [name]. MRN: [id number] DOB: 05-16-1941  Dylan [name]. is a 78 y.o. year old male who is a primary care patient of [name], MD. The CCM team was consulted for assistance with [employer].   Review of patient status, including review of consultants reports, other relevant assessments, and collaboration with appropriate care team members and the patient's provider was performed as part of comprehensive patient evaluation and provision of chronic care management services.     Goals Addressed            This Visit's Progress   . "He needs to be more active"       Fiance Stated Current Barriers:  . Social Isolation . Lacks knowledge of community resource: Nurse, adult  . COVID-19  Clinical Social Work Clinical Goal(s):  Dylan [name] Kitchen Over the next 60 days, patient will work with SW to address needs related to engagement in community programs  Interventions: . CCM SW interviewed the patients fiance Dylan [name] who the patient has given permission to speak with . Educated [name] on local senior centers and activities available to the older adult population . Reviewed current concerns of participating in any public events due to ERXVQ-00 . Developed a plan to follow up with the patient and Mrs Arsenio Loader in the next month to re-evaluate the status of COVID-19 and how it is impacting the community  Patient Self Care Activities:  . Attends all scheduled provider appointments . Performs ADL's independently . Calls provider office for new concerns or questions  Initial goal documentation       Follow Up Plan: SW will follow up with patient by phone over the next month  Daneen Schick, BSW, CDP [facility] Care Management Social Worker [phone]  Total time spent performing care coordination and/or care management activities with the patient by phone or face to face =  12 minutes.

## 2019-03-21 ENCOUNTER — Ambulatory Visit (INDEPENDENT_AMBULATORY_CARE_PROVIDER_SITE_OTHER): Payer: Medicare HMO | Admitting: Internal Medicine

## 2019-03-21 ENCOUNTER — Encounter: Payer: Self-pay | Admitting: Internal Medicine

## 2019-03-21 ENCOUNTER — Other Ambulatory Visit: Payer: Self-pay

## 2019-03-21 VITALS — BP 136/70 | HR 65 | Temp 97.9°F | Ht 68.0 in | Wt 200.6 lb

## 2019-03-21 DIAGNOSIS — E538 Deficiency of other specified B group vitamins: Secondary | ICD-10-CM | POA: Diagnosis not present

## 2019-03-21 DIAGNOSIS — N183 Chronic kidney disease, stage 3 unspecified: Secondary | ICD-10-CM

## 2019-03-21 DIAGNOSIS — I1310 Hypertensive heart and chronic kidney disease without heart failure, with stage 1 through stage 4 chronic kidney disease, or unspecified chronic kidney disease: Secondary | ICD-10-CM

## 2019-03-21 MED ORDER — CYANOCOBALAMIN 1000 MCG/ML IJ SOLN
1000.0000 ug | Freq: Once | INTRAMUSCULAR | Status: AC
  Administered 2019-03-21: 1000 ug via INTRAMUSCULAR

## 2019-03-22 ENCOUNTER — Telehealth: Payer: Medicare HMO

## 2019-03-23 NOTE — Progress Notes (Signed)
Subjective:     Patient ID: Dylan [name]. , male    DOB: 1940/11/24 , 78 y.o.   MRN: [id number]   Chief Complaint  Patient presents with  . B12 Injection    HPI  He presents today for first monthly injection of Vitamin B12. He has not had any issues since his last injection one month ago. He denies having any recent falls since his last visit.    Past Medical History:  Diagnosis Date  . Aortic stenosis    mild AS 11/24/16 (peak grad 24, mean grad 11) Dr. [name]  . CHB (complete heart block) ([place]) 07/2017  . 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 Diabetes mellitus without complication ([place])   . Gout   . Hypertension   . 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 Peripheral arterial disease ([place])   . 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 Second degree AV block    Wenckebach; no indication for pacemaker as of 12/01/16 (Dr. [name])  . Vitamin B12 deficiency anemia 03/01/2018  . Vitamin D deficiency disease      Family History  Problem Relation Age of Onset  . Diabetes Mother   . Breast cancer Mother   . Arthritis Mother   . Hypertension Father   . Diabetes Sister   . Diabetes Brother   . Diabetes Maternal Grandmother   . Diabetes Brother   . Diabetes Brother   . Diabetes Brother   . Diabetes Sister   . Diabetes Sister   . Diabetes Sister      Current Outpatient Medications:  .  allopurinol (ZYLOPRIM) 100 MG tablet, Take 100 mg by mouth daily. , Disp: , Rfl:  .  amLODipine (NORVASC) 5 MG tablet, TAKE 1 TABLET BY MOUTH EVERY DAY, Disp: 90 tablet, Rfl: 1 .  Ascorbic Acid (VITAMIN C) 1000 MG tablet, Take 1,000 mg by mouth daily., Disp: , Rfl:  .  aspirin 81 MG tablet, Take 81 mg by mouth 3 (three) times a week. , Disp: , Rfl:  .  carvedilol (COREG) 12.5 MG tablet, Take 1 tablet (12.5 mg total) by mouth 2 (two) times daily., Disp: 180 tablet, Rfl: 3 .  Cholecalciferol (VITAMIN D3) 5000 units CAPS, Take 5,000 Units by mouth every  evening., Disp: , Rfl:  .  Coenzyme Q10 200 MG capsule, Take 200 mg by mouth daily., Disp: , Rfl:  .  COLCRYS 0.6 MG tablet, Take 1 tablet (0.6 mg total) by mouth daily., Disp: 90 tablet, Rfl: 1 .  Dulaglutide (TRULICITY) 1.5 OJ/5.0KX SOPN, Inject 1.5 mg into the skin once a week., Disp: 4 pen, Rfl: 1 .  glucose blood test strip, 1 each by Other route as needed for other. Use as instructed, Disp: , Rfl:  .  metFORMIN (GLUCOPHAGE) 1000 MG tablet, TAKE 1 TABLET BY MOUTH TWICE A DAY WITH MEALS (Patient taking differently: 1,000 mg daily. ), Disp: 180 tablet, Rfl: 1 .  pravastatin (PRAVACHOL) 20 MG tablet, Take 20 mg by mouth daily. , Disp: , Rfl:  .  valsartan (DIOVAN) 320 MG tablet, TAKE 1 TABLET BY MOUTH EVERY DAY, Disp: 90 tablet, Rfl: 1   No Known Allergies   Review of Systems  Constitutional: Negative.   Respiratory: Negative.   Cardiovascular: Negative.   Gastrointestinal: Negative.   Neurological: Negative.   Psychiatric/Behavioral: Negative.      Today's Vitals   03/21/19 1115  BP: 136/70  Pulse: 65  Temp: 97.9 F (36.6 C)  TempSrc: Oral  Weight: 200 lb 9.6 oz (91 kg)  Height: 5\' 8"  (1.727 m)  PainSc: 0-No pain   Body mass index is 30.5 kg/m.   Objective:  Physical Exam Vitals signs and nursing note reviewed.  Constitutional:      Appearance: Normal appearance.  Cardiovascular:     Rate and Rhythm: Normal rate and regular rhythm.     Heart sounds: Normal heart sounds.  Pulmonary:     Effort: Pulmonary effort is normal.     Breath sounds: Normal breath sounds.  Skin:    General: Skin is warm.  Neurological:     General: No focal deficit present.     Mental Status: He is alert.  Psychiatric:        Mood and Affect: Mood normal.         Assessment And Plan:     1. Vitamin B12 deficiency  He was given Vit b12 IM x 1. He will rto in one month for his next injection.   - cyanocobalamin ((VITAMIN B-12)) injection 1,000 mcg  2. Hypertensive heart and renal  disease with renal failure, stage 1 through stage 4 or unspecified chronic kidney disease, without heart failure  Fair control. Pt advised optimal bp is less than 120/80. He is again encouraged to incorporate more exercise into his daily routine. He is encouraged to walk his driveway daily. He agrees to start doing this.   3. CKD (chronic kidney disease), stage III (HCC)  Chronic.   Maximino Greenland, MD    THE PATIENT IS ENCOURAGED TO PRACTICE SOCIAL DISTANCING DUE TO THE COVID-19 PANDEMIC.

## 2019-03-26 ENCOUNTER — Other Ambulatory Visit: Payer: Self-pay

## 2019-03-26 ENCOUNTER — Ambulatory Visit (INDEPENDENT_AMBULATORY_CARE_PROVIDER_SITE_OTHER): Payer: Medicare HMO | Admitting: *Deleted

## 2019-03-26 DIAGNOSIS — I442 Atrioventricular block, complete: Secondary | ICD-10-CM

## 2019-03-26 DIAGNOSIS — I472 Ventricular tachycardia, unspecified: Secondary | ICD-10-CM

## 2019-03-26 LAB — CUP PACEART REMOTE DEVICE CHECK
Date Time Interrogation Session: 20200526134903
Implantable Lead Implant Date: 20180911
Implantable Lead Implant Date: 20180911
Implantable Lead Location: 753859
Implantable Lead Location: 753860
Implantable Pulse Generator Implant Date: 20180911
Pulse Gen Model: 2272
Pulse Gen Serial Number: 8940191

## 2019-03-28 ENCOUNTER — Telehealth: Payer: Medicare HMO

## 2019-03-30 DIAGNOSIS — N183 Chronic kidney disease, stage 3 (moderate): Secondary | ICD-10-CM

## 2019-03-30 DIAGNOSIS — E1122 Type 2 diabetes mellitus with diabetic chronic kidney disease: Secondary | ICD-10-CM

## 2019-03-30 DIAGNOSIS — I1310 Hypertensive heart and chronic kidney disease without heart failure, with stage 1 through stage 4 chronic kidney disease, or unspecified chronic kidney disease: Secondary | ICD-10-CM | POA: Diagnosis not present

## 2019-04-04 NOTE — Progress Notes (Signed)
Remote pacemaker transmission.   

## 2019-04-09 DIAGNOSIS — E113313 Type 2 diabetes mellitus with moderate nonproliferative diabetic retinopathy with macular edema, bilateral: Secondary | ICD-10-CM | POA: Diagnosis not present

## 2019-04-15 ENCOUNTER — Telehealth: Payer: Medicare HMO

## 2019-04-15 ENCOUNTER — Other Ambulatory Visit: Payer: Self-pay

## 2019-04-15 ENCOUNTER — Ambulatory Visit (INDEPENDENT_AMBULATORY_CARE_PROVIDER_SITE_OTHER): Payer: Medicare HMO

## 2019-04-15 DIAGNOSIS — I1310 Hypertensive heart and chronic kidney disease without heart failure, with stage 1 through stage 4 chronic kidney disease, or unspecified chronic kidney disease: Secondary | ICD-10-CM

## 2019-04-15 DIAGNOSIS — E1122 Type 2 diabetes mellitus with diabetic chronic kidney disease: Secondary | ICD-10-CM

## 2019-04-15 DIAGNOSIS — R269 Unspecified abnormalities of gait and mobility: Secondary | ICD-10-CM

## 2019-04-15 DIAGNOSIS — N183 Chronic kidney disease, stage 3 unspecified: Secondary | ICD-10-CM

## 2019-04-15 DIAGNOSIS — R413 Other amnesia: Secondary | ICD-10-CM

## 2019-04-15 DIAGNOSIS — N182 Chronic kidney disease, stage 2 (mild): Secondary | ICD-10-CM | POA: Diagnosis not present

## 2019-04-16 ENCOUNTER — Ambulatory Visit: Payer: Medicare HMO | Admitting: Orthotics

## 2019-04-16 ENCOUNTER — Other Ambulatory Visit: Payer: Self-pay

## 2019-04-16 ENCOUNTER — Ambulatory Visit: Payer: Medicare HMO

## 2019-04-16 ENCOUNTER — Ambulatory Visit (INDEPENDENT_AMBULATORY_CARE_PROVIDER_SITE_OTHER): Payer: Medicare HMO | Admitting: Podiatry

## 2019-04-16 VITALS — Temp 97.3°F

## 2019-04-16 DIAGNOSIS — E1151 Type 2 diabetes mellitus with diabetic peripheral angiopathy without gangrene: Secondary | ICD-10-CM

## 2019-04-16 DIAGNOSIS — B351 Tinea unguium: Secondary | ICD-10-CM

## 2019-04-16 DIAGNOSIS — R413 Other amnesia: Secondary | ICD-10-CM

## 2019-04-16 DIAGNOSIS — M79675 Pain in left toe(s): Secondary | ICD-10-CM

## 2019-04-16 DIAGNOSIS — R269 Unspecified abnormalities of gait and mobility: Secondary | ICD-10-CM

## 2019-04-16 DIAGNOSIS — L02611 Cutaneous abscess of right foot: Secondary | ICD-10-CM

## 2019-04-16 DIAGNOSIS — M79674 Pain in right toe(s): Secondary | ICD-10-CM

## 2019-04-16 NOTE — Progress Notes (Signed)

## 2019-04-16 NOTE — Patient Instructions (Signed)
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
## 2019-04-17 NOTE — Chronic Care Management (AMB) (Signed)
  Chronic Care Management    Social Work Follow Up Note  04/16/2019 Name: Dylan [name]. MRN: [id number] DOB: 07-10-1941  [name]. is a 78 y.o. year old male who is a primary care patient of Dylan Chard, MD. The CCM team was consulted for assistance with Dylan [name].   Review of patient status, including review of consultants reports, other relevant assessments, and collaboration with appropriate care team members and the patient's provider was performed as part of comprehensive patient evaluation and provision of chronic care management services.     Goals Addressed            This Visit's Progress   . "He needs to be more active"   Not on track    Fiance Stated Current Barriers:  . Social Isolation . Lacks knowledge of community resource: Nurse, adult   . COVID-[name]  Clinical Social Work Clinical Goal(s):  [name] Over the next 60 days, patient will work with SW to address needs related to engagement in community programs  Interventions: . CCM SW interviewed the patients fiance Dylan [name] who the patient has given permission to speak with . Discussed barriers to the patient becoming more active due to continued closures of local senior centers in response to Dylan [name] . Informed by Dylan [name] the patient is "not walking well at all" . Encouraged Dylan [name] to speak with CCM RN Case Manager as well as the patients primary care provider about concerns . Assessed for interest in the patient participating in physical therapy; Dylan [name] reports the patient may be okay with outpatient therapy. Dylan [name] also states she has been in contact with CCM RN Case Manager to discuss concerns and interest in visiting a neurologist . Collaboration with CCM RN Case Manager to confirm request has been placed for neurologist referral due to patient experiencing difficulty walking. . Follow up call planned to the patient in the next month to assess for ability to  engage in an activity program  Patient Self Care Activities:  . Attends all scheduled provider appointments . Performs ADL's independently . Calls provider office for new concerns or questions  Please see past updates related to this goal by clicking on the "Past Updates" button in the selected goal          Follow Up Plan: SW will follow up with patient by phone over the next 4 weeks.   Dylan [name], BSW, CDP Social Worker, Certified Dementia Practitioner [place] [facility] [facility] [phone]  Total time spent performing care coordination and/or care management activities with the patient by phone or face to face = 14 minutes.

## 2019-04-17 NOTE — Patient Instructions (Signed)
Social Worker Visit Information  Goals we discussed today:  Goals Addressed            This Visit's Progress   . "He needs to be more active"   Not on track    Fiance Stated Current Barriers:  . Social Isolation . Lacks knowledge of community resource: Nurse, adult   . COVID-19  Clinical Social Work Clinical Goal(s):  [name] Over the next 60 days, patient will work with SW to address needs related to engagement in community programs  Interventions: . CCM SW interviewed the patients fiance [name] who the patient has given permission to speak with . Discussed barriers to the patient becoming more active due to continued closures of local senior centers in response to [place] [address] . Informed by [name] the patient is "not walking well at all" . Encouraged [name] to speak with CCM RN Case Manager as well as the patients primary care provider about concerns . Assessed for interest in the patient participating in physical therapy; [name] reports the patient may be okay with outpatient therapy. [name] also states she has been in contact with CCM RN Case Manager to discuss concerns and interest in visiting a neurologist . Collaboration with CCM RN Case Manager to confirm request has been placed for neurologist referral due to patient experiencing difficulty walking. . Follow up call planned to the patient in the next month to assess for ability to engage in an activity program  Patient Self Care Activities:  . Attends all scheduled provider appointments . Performs ADL's independently . Calls provider office for new concerns or questions  Please see past updates related to this goal by clicking on the "Past Updates" button in the selected goal          Materials Provided: Verbal education about senior centers provided by phone  Follow Up Plan: SW will follow up with patient by phone over the next month   Daneen Schick, BSW, CDP Social Worker,  Certified Dementia Practitioner [place] [facility] [place] [facility] [phone]

## 2019-04-19 ENCOUNTER — Telehealth: Payer: Medicare HMO

## 2019-04-22 ENCOUNTER — Telehealth: Payer: Self-pay

## 2019-04-23 ENCOUNTER — Other Ambulatory Visit: Payer: Self-pay | Admitting: Internal Medicine

## 2019-04-23 ENCOUNTER — Ambulatory Visit
Admission: RE | Admit: 2019-04-23 | Discharge: 2019-04-23 | Disposition: A | Discharge status: Home / Self Care | Payer: Medicare HMO | Source: Ambulatory Visit | Attending: Internal Medicine | Admitting: Internal Medicine

## 2019-04-23 ENCOUNTER — Ambulatory Visit: Payer: Medicare HMO | Admitting: Internal Medicine

## 2019-04-23 ENCOUNTER — Other Ambulatory Visit: Payer: Self-pay

## 2019-04-23 ENCOUNTER — Encounter: Payer: Self-pay | Admitting: Internal Medicine

## 2019-04-23 VITALS — BP 154/88 | HR 72 | Temp 98.2°F | Ht 68.0 in | Wt 199.6 lb

## 2019-04-23 DIAGNOSIS — E1122 Type 2 diabetes mellitus with diabetic chronic kidney disease: Secondary | ICD-10-CM

## 2019-04-23 DIAGNOSIS — R251 Tremor, unspecified: Secondary | ICD-10-CM

## 2019-04-23 DIAGNOSIS — N183 Chronic kidney disease, stage 3 unspecified: Secondary | ICD-10-CM

## 2019-04-23 DIAGNOSIS — M25551 Pain in right hip: Secondary | ICD-10-CM

## 2019-04-23 DIAGNOSIS — I1310 Hypertensive heart and chronic kidney disease without heart failure, with stage 1 through stage 4 chronic kidney disease, or unspecified chronic kidney disease: Secondary | ICD-10-CM

## 2019-04-23 DIAGNOSIS — E538 Deficiency of other specified B group vitamins: Secondary | ICD-10-CM

## 2019-04-23 DIAGNOSIS — R05 Cough: Secondary | ICD-10-CM

## 2019-04-23 DIAGNOSIS — R413 Other amnesia: Secondary | ICD-10-CM

## 2019-04-23 DIAGNOSIS — M1611 Unilateral primary osteoarthritis, right hip: Secondary | ICD-10-CM | POA: Diagnosis not present

## 2019-04-23 DIAGNOSIS — R059 Cough, unspecified: Secondary | ICD-10-CM

## 2019-04-23 DIAGNOSIS — R296 Repeated falls: Secondary | ICD-10-CM

## 2019-04-24 ENCOUNTER — Telehealth: Payer: Self-pay

## 2019-04-25 ENCOUNTER — Other Ambulatory Visit: Payer: Medicare HMO

## 2019-04-25 ENCOUNTER — Ambulatory Visit: Payer: Medicare HMO

## 2019-04-25 ENCOUNTER — Telehealth: Payer: Self-pay

## 2019-04-25 ENCOUNTER — Telehealth: Payer: Self-pay | Admitting: *Deleted

## 2019-04-25 DIAGNOSIS — Z20822 Contact with and (suspected) exposure to covid-19: Secondary | ICD-10-CM

## 2019-04-25 MED ORDER — CYANOCOBALAMIN 1000 MCG/ML IJ SOLN
1000.0000 ug | Freq: Once | INTRAMUSCULAR | Status: AC
  Administered 2019-04-23: 1000 ug via INTRAMUSCULAR

## 2019-04-25 NOTE — Progress Notes (Signed)
Subjective:     Patient ID: Dylan [name]. , male    DOB: Jan 12, 1941 , 78 y.o.   MRN: [id number]   Chief Complaint  Patient presents with  . Diabetes  . Hip Pain    right    HPI  Diabetes He presents for his follow-up diabetic visit. He has type 2 diabetes mellitus. There are no hypoglycemic associated symptoms. Pertinent negatives for diabetes include no blurred vision. There are no hypoglycemic complications. Diabetic complications include nephropathy. Risk factors for coronary artery disease include diabetes mellitus, dyslipidemia, hypertension, sedentary lifestyle, male sex and obesity. He participates in exercise intermittently. His breakfast blood glucose is taken between 9-10 am. His breakfast blood glucose range is generally 140-180 mg/dl. An ACE inhibitor/angiotensin II receptor blocker is being taken. He does not see a podiatrist.Eye exam is current.  Hip Pain  The incident occurred 5 to 7 days ago. The incident occurred at a nursing home. The injury mechanism was a fall. The pain is present in the right hip. The pain is at a severity of 6/10. The pain is moderate. The pain has been fluctuating since onset. Pertinent negatives include no numbness or tingling. The treatment provided moderate relief.  Hypertension This is a chronic problem. The current episode started more than 1 year ago. The problem has been gradually improving since onset. The problem is controlled. Pertinent negatives include no blurred vision. There is no history of sleep apnea.     Past Medical History:  Diagnosis Date  . Aortic stenosis    mild AS 11/24/16 (peak grad 24, mean grad 11) Dr. [name]  . CHB (complete heart block) ([place]) 07/2017  . 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 Diabetes mellitus without complication ([place])   . Gout   . Hypertension   . 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 Peripheral arterial disease ([place])   . 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 Second degree AV block    Wenckebach; no indication for pacemaker as of 12/01/16 (Dr. [name])  . Vitamin B12 deficiency anemia 03/01/2018  . Vitamin D deficiency disease      Family History  Problem Relation Age of Onset  . Diabetes Mother   . Breast cancer Mother   . Arthritis Mother   . Hypertension Father   . Diabetes Sister   . Diabetes Brother   . Diabetes Maternal Grandmother   . Diabetes Brother   . Diabetes Brother   . Diabetes Brother   . Diabetes Sister   . Diabetes Sister   . Diabetes Sister      Current Outpatient Medications:  .  allopurinol (ZYLOPRIM) 100 MG tablet, Take 100 mg by mouth daily. , Disp: , Rfl:  .  amLODipine (NORVASC) 5 MG tablet, TAKE 1 TABLET BY MOUTH EVERY DAY, Disp: 90 tablet, Rfl: 1 .  Ascorbic Acid (VITAMIN C) 1000 MG tablet, Take 1,000 mg by mouth daily., Disp: , Rfl:  .  aspirin 81 MG tablet, Take 81 mg by mouth 3 (three) times a week. , Disp: , Rfl:  .  carvedilol (COREG) 12.5 MG tablet, Take 1 tablet (12.5 mg total) by mouth 2 (two) times daily., Disp: 180 tablet, Rfl: 3 .  Cholecalciferol (VITAMIN D3) 5000 units CAPS, Take 5,000 Units by mouth every evening., Disp: , Rfl:  .  Coenzyme Q10 200 MG capsule, Take 200 mg by mouth daily., Disp: , Rfl:  .  COLCRYS 0.6 MG tablet, Take 1 tablet (0.6  mg total) by mouth daily., Disp: 90 tablet, Rfl: 1 .  Dulaglutide (TRULICITY) 1.5 VQ/9.4HW SOPN, Inject 1.5 mg into the skin once a week., Disp: 4 pen, Rfl: 1 .  glucose blood test strip, 1 each by Other route as needed for other. Use as instructed, Disp: , Rfl:  .  metFORMIN (GLUCOPHAGE) 1000 MG tablet, TAKE 1 TABLET BY MOUTH TWICE A DAY WITH MEALS (Patient taking differently: 1,000 mg daily. ), Disp: 180 tablet, Rfl: 1 .  pravastatin (PRAVACHOL) 20 MG tablet, Take 20 mg by mouth daily. , Disp: , Rfl:  .  valsartan (DIOVAN) 320 MG tablet, TAKE 1 TABLET BY MOUTH EVERY DAY, Disp: 90 tablet, Rfl: 1   No Known Allergies   Review of Systems   Constitutional: Negative.   Eyes: Negative for blurred vision.  Respiratory: Negative.   Cardiovascular: Negative.   Gastrointestinal: Negative.   Neurological: Negative.  Negative for tingling and numbness.  Psychiatric/Behavioral: Negative.      Today's Vitals   04/23/19 1227  BP: (!) 154/88  Pulse: 72  Temp: 98.2 F (36.8 C)  TempSrc: Oral  Weight: 199 lb 9.6 oz (90.5 kg)  Height: '5\' 8"'  (1.727 m)  PainSc: 6   PainLoc: Hip   Body mass index is 30.35 kg/m.   Objective:  Physical Exam Vitals signs and nursing note reviewed.  Constitutional:      Appearance: Normal appearance.  Cardiovascular:     Rate and Rhythm: Normal rate and regular rhythm.     Heart sounds: Normal heart sounds.  Pulmonary:     Effort: Pulmonary effort is normal.     Breath sounds: Normal breath sounds.  Skin:    General: Skin is warm.  Neurological:     General: No focal deficit present.     Mental Status: He is alert.  Psychiatric:        Mood and Affect: Mood normal.         Assessment And Plan:     1. Type 2 diabetes mellitus with stage 3 chronic kidney disease, without long-term current use of insulin (HCC)  Chronic. I will check labs as listed below. Importance of dietary and medication compliance was again discussed with the patient.   - BMP8+EGFR - Hemoglobin A1c  2. Right hip pain  I will send him for right hip xray. I will make further recommendations once his results are available for review. 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   3. Hypertensive heart and renal disease with renal failure, stage 1 through stage 4 or unspecified chronic kidney disease, without heart failure  Uncontrolled. I question whether or not he is taking meds as prescribed. He is now using [facility], and he is encouraged to take meds as directed. I did speak to his partner, who is encouraged to check meds inside of pill packs to ensure they are equivalent to his medication list.  She voices understanding of this.   4. Tremor  This is not noted on exam today.  I will refer him to Neuro for further evaluation.   - Ambulatory referral to Neurology  5. Memory loss  Possibly related to previous vitamin B12 deficiency. As per his request, I will refer him to Neuro for further evaluation.   - Ambulatory referral to Neurology  6. Frequent falls  He has been referred for home health physical therapy. He is reminded that they will be required to come into the home to perform services.   7.  Vitamin B12 deficiency  He was given vitamin B12 injection today.   - Methylmalonic Acid  8. Cough  I will refer him for COVID testing. He and his partner are encouraged to self quarantine (which they have been doing anyway).   Maximino Greenland, MD    THE PATIENT IS ENCOURAGED TO PRACTICE SOCIAL DISTANCING DUE TO THE COVID-19 PANDEMIC.

## 2019-04-25 NOTE — Telephone Encounter (Signed)
Pt scheduled for covid testing today @ 3:15 @ [facility]. Instructions given and order placed,

## 2019-04-25 NOTE — Telephone Encounter (Signed)
Hello,  [name] would like to have the patient contacted and scheduled to be tested for the coronavirus.  Thank you.

## 2019-04-26 ENCOUNTER — Other Ambulatory Visit: Payer: Self-pay

## 2019-04-26 DIAGNOSIS — M25551 Pain in right hip: Secondary | ICD-10-CM

## 2019-04-26 LAB — BMP8+EGFR
BUN/Creatinine Ratio: 19 (ref 10–24)
BUN: 26 mg/dL (ref 8–27)
CO2: 22 mmol/L (ref 20–29)
Calcium: 9.3 mg/dL (ref 8.6–10.2)
Chloride: 101 mmol/L (ref 96–106)
Creatinine, Ser: 1.34 mg/dL — ABNORMAL HIGH (ref 0.76–1.27)
GFR calc Af Amer: 59 mL/min/{1.73_m2} — ABNORMAL LOW (ref >59)
GFR calc non Af Amer: 51 mL/min/{1.73_m2} — ABNORMAL LOW (ref >59)
Glucose: 240 mg/dL — ABNORMAL HIGH (ref 65–99)
Potassium: 4.5 mmol/L (ref 3.5–5.2)
Sodium: 139 mmol/L (ref 134–144)

## 2019-04-26 LAB — METHYLMALONIC ACID, SERUM: Methylmalonic Acid: 125 nmol/L (ref 0–378)

## 2019-04-26 LAB — HEMOGLOBIN A1C
Est. average glucose Bld gHb Est-mCnc: 189 mg/dL
Hgb A1c MFr Bld: 8.2 % — ABNORMAL HIGH (ref 4.8–5.6)

## 2019-04-26 NOTE — Progress Notes (Signed)
Chronic Care Management   Initial Visit Note  04/25/2019 Name: Dylan [name]. MRN: [id number] DOB: 1941/09/23  Referred by: [name], MD Reason for referral : Chronic Care Management   Dylan [name]. is a 78 y.o. year old male who is a primary care patient of [name], MD. The CCM team was consulted for assistance with chronic disease management and care coordination needs.   Review of patient status, including review of consultants reports, relevant laboratory and other test results, and collaboration with appropriate care team members and the patient's provider was performed as part of comprehensive patient evaluation and provision of chronic care management services.    I spoke with Ms. Casalino and wife by telephone today.  Objective:   Goals Addressed            This Visit's Progress   . "I'm worried he's not filling his pill box correctly"       Current Barriers:  . Polypharmacy . Impaired cognitive ability (per spouse pt has some short term memory loss/confusion)  Nurse Case Manager  & PharmD Clinical Goal(s): Over the next 30 days, patient/caregiver will transition to blister pill pack and patient will report taking his medications exactly as prescribed w/o missed doses. 04/15/19 Unknown  04/15/19: Over the next 30 days, patient will work with the embedded PharmD Lottie Dawson to review proper use of the pill package system and will follow her recommendations for Self administering his medications exactly as directed.   CCM RN CM Interventions:  04/15/19 Call completed with spouse [name]   Assessed for completion of transition to pill package system (spouse confirms this has been put into place)  Assessed for questions or concerns related to current medication regimen (spouse is concerned about the patient removing the pills from the packaging, she is unsure if he is actually taking the meds as prescribed)  Discussed having patient come into  the office for a face to face visit with CCM PharmD to review proper use of pill packaging system - spouse agrees  Referral sent to embedded PharmD Lottie Dawson with an update and request to schedule a face to face visit to establish patient is using pill packaging system correctly  Discussed plans with patient for ongoing care management follow up and provided patient with direct contact information for care management team  CCM PharmD Interventions:  04/24/19 Call completed with spouse [name] & patient . [facility] [place] just delivered new pill pack system to patient.  Phone number to company ([facility] [phone], order [phone]). Patient & wife were confused about packs.  I walked them through patient's medications.  Every PO medication from patient's med list was included in packs except OTC vitamins and aspirin.  Encouraged wife to assist with giving these medications as directed.  They were able to find the PM packs as patient takes his medications twice daily (in the AM and PM).  PM medications include carvedilol and metformin.  All other RX medications are included in his AM packs. . Encouraged patient & wife to come in the office to meet face to face in additional questions arise or information is needed. . Wife seemed to understand medication system better and we discovered medications were correct. . Will follow up in 2 weeks  Patient Self Care Activities:   Spouse verbalizes understanding related to instructions on how, where and when to [facility]  system  Spouse verbalizes understanding about who to call for medication questions/concerns (pharmacy, PCP office or CCM team)   Please see past updates related to this goal by clicking on the "Past Updates" button in the selected goal            Plan:   The care management team will reach out to the patient again over the  next 2 weeks.  Regina Eck, PharmD, BCPS Clinical Pharmacist, [facility] [place]: [phone]

## 2019-04-26 NOTE — Patient Instructions (Signed)
Visit Information  Goals Addressed            This Visit's Progress   . "I'm worried he's not filling his pill box correctly"       Current Barriers:  . Polypharmacy . Impaired cognitive ability (per spouse pt has some short term memory loss/confusion)  Nurse Case Manager  & PharmD Clinical Goal(s): Over the next 30 days, patient/caregiver will transition to blister pill pack and patient will report taking his medications exactly as prescribed w/o missed doses. 04/15/19 Unknown  04/15/19: Over the next 30 days, patient will work with the embedded PharmD Lottie Dawson to review proper use of the pill package system and will follow her recommendations for Self administering his medications exactly as directed.   CCM RN CM Interventions:  04/15/19 Call completed with spouse Dylan [name]   Assessed for completion of transition to pill package system (spouse confirms this has been put into place)  Assessed for questions or concerns related to current medication regimen (spouse is concerned about the patient removing the pills from the packaging, she is unsure if he is actually taking the meds as prescribed)  Discussed having patient come into the office for a face to face visit with CCM PharmD to review proper use of pill packaging system - spouse agrees  Referral sent to embedded PharmD Lottie Dawson with an update and request to schedule a face to face visit to establish patient is using pill packaging system correctly  Discussed plans with patient for ongoing care management follow up and provided patient with direct contact information for care management team  CCM PharmD Interventions:  04/24/19 Call completed with spouse Dylan [name] & patient . [facility] [place] just delivered new pill pack system to patient.  Phone number to company ([facility] [phone], order [phone]). Patient & wife were confused about packs.  I walked them through  patient's medications.  Every PO medication from patient's med list was included in packs except OTC vitamins and aspirin.  Encouraged wife to assist with giving these medications as directed.  They were able to find the PM packs as patient takes his medications twice daily (in the AM and PM).  PM medications include carvedilol and metformin.  All other RX medications are included in his AM packs. . Encouraged patient & wife to come in the office to meet face to face in additional questions arise or information is needed. . Wife seemed to understand medication system better and we discovered medications were correct. . Will follow up in 2 weeks  Patient Self Care Activities:   Spouse verbalizes understanding related to instructions on how, where and when to [facility] system  Spouse verbalizes understanding about who to call for medication questions/concerns (pharmacy, PCP office or CCM team)   Please see past updates related to this goal by clicking on the "Past Updates" button in the selected goal          The patient verbalized understanding of instructions provided today and declined a print copy of patient instruction materials.   The care management team will reach out to the patient again over the next 14 days.   Regina Eck, PharmD, BCPS Clinical Pharmacist, [facility] [place]: [phone]

## 2019-04-29 ENCOUNTER — Telehealth: Payer: Self-pay

## 2019-04-29 ENCOUNTER — Telehealth: Payer: Medicare HMO

## 2019-04-29 ENCOUNTER — Encounter: Payer: Self-pay | Admitting: Podiatry

## 2019-04-29 NOTE — Progress Notes (Signed)
Subjective:  Dylan [name]. presents to clinic today with cc of  painful, thick, discolored, elongated toenails 1-5 b/l that become tender and cannot cut because of thickness.  Pain is aggravated when wearing enclosed shoe gear.  Daughter cut his nails a while ago.  [name], MD is his PCP.    Current Outpatient Medications:  .  allopurinol (ZYLOPRIM) 100 MG tablet, Take 100 mg by mouth daily. , Disp: , Rfl:  .  amLODipine (NORVASC) 5 MG tablet, TAKE 1 TABLET BY MOUTH EVERY DAY, Disp: 90 tablet, Rfl: 1 .  Ascorbic Acid (VITAMIN C) 1000 MG tablet, Take 1,000 mg by mouth daily., Disp: , Rfl:  .  aspirin 81 MG tablet, Take 81 mg by mouth 3 (three) times a week. , Disp: , Rfl:  .  carvedilol (COREG) 12.5 MG tablet, Take 1 tablet (12.5 mg total) by mouth 2 (two) times daily., Disp: 180 tablet, Rfl: 3 .  Cholecalciferol (VITAMIN D3) 5000 units CAPS, Take 5,000 Units by mouth every evening., Disp: , Rfl:  .  Coenzyme Q10 200 MG capsule, Take 200 mg by mouth daily., Disp: , Rfl:  .  COLCRYS 0.6 MG tablet, Take 1 tablet (0.6 mg total) by mouth daily., Disp: 90 tablet, Rfl: 1 .  Dulaglutide (TRULICITY) 1.5 WS/5.6CL SOPN, Inject 1.5 mg into the skin once a week., Disp: 4 pen, Rfl: 1 .  glucose blood test strip, 1 each by Other route as needed for other. Use as instructed, Disp: , Rfl:  .  metFORMIN (GLUCOPHAGE) 1000 MG tablet, TAKE 1 TABLET BY MOUTH TWICE A DAY WITH MEALS (Patient taking differently: 1,000 mg daily. ), Disp: 180 tablet, Rfl: 1 .  pravastatin (PRAVACHOL) 20 MG tablet, Take 20 mg by mouth daily. , Disp: , Rfl:  .  valsartan (DIOVAN) 320 MG tablet, TAKE 1 TABLET BY MOUTH EVERY DAY, Disp: 90 tablet, Rfl: 1   No Known Allergies   Objective: Vitals:   04/16/19 1510  Temp: (!) 97.3 F (36.3 C)    Physical Examination:  Vascular Examination: Capillary refill time delayed x 10 digits.  Faintly palpable DP/PT pulses b/l.  Digital hair absent x 10.  Skin temperature  gradient WNL b/l.  Dermatological Examination: Skin with normal turgor, texture and tone b/l.  No open wounds b/l.  No interdigital macerations noted b/l.  Elongated, thick, discolored brittle toenails with subungual debris and pain on dorsal palpation of nailbeds 1-5 b/l.  Musculoskeletal Examination: Muscle strength 5/5 to all muscle groups b/l  No pain, crepitus or joint discomfort with active/passive ROM.  Neurological Examination: Sensation intact 5/5 b/l with 10 gram monofilament.  Vibratory sensation intact b/l.  Assessment: Mycotic nail infection with pain 1-5 b/l  Plan: 1. Toenails 1-5 b/l were debrided in length and girth without iatrogenic laceration. 2.  Continue soft, supportive shoe gear daily. 3.  Report any pedal injuries to medical professional. 4.  Follow up 3 months. 5.  Patient/POA to call should there be a question/concern in there interim.

## 2019-04-29 NOTE — Telephone Encounter (Signed)
-----   Message from [name], MD sent at 04/28/2019  9:44 PM EDT ----- Your kidney fxn has worsened. Be sure to stay well hydrated. Your hba1c has worsened. Are you still taking your meds. You get meds in pill packs correct? Do you take all meds included in the pill packs?

## 2019-04-29 NOTE — Telephone Encounter (Signed)
Left the patient a message to call back for lab results. 

## 2019-05-01 LAB — NOVEL CORONAVIRUS, NAA: SARS-CoV-2, NAA: NOT DETECTED

## 2019-05-06 ENCOUNTER — Telehealth: Payer: Self-pay

## 2019-05-09 ENCOUNTER — Telehealth: Payer: Self-pay

## 2019-05-10 ENCOUNTER — Ambulatory Visit: Payer: Medicare HMO | Admitting: Pharmacist

## 2019-05-12 NOTE — Progress Notes (Signed)
Chronic Care Management   Visit Note  05/10/2019 Name: Dylan [name]. MRN: [id number] DOB: 06/18/1941  Referred by: [name], MD Reason for referral : Chronic Care Management   Dylan [name]. is a 78 y.o. year old male who is a primary care patient of [name], MD. The CCM team was consulted for assistance with chronic disease management and care coordination needs.   Review of patient status, including review of consultants reports, relevant laboratory and other test results, and collaboration with appropriate care team members and the patient's provider was performed as part of comprehensive patient evaluation and provision of chronic care management services.    I spoke with patient and wife, Dylan [name], by telephone today.  Objective:   Goals Addressed            This Visit's Progress   . "I'm worried he's not filling his pill box correctly"       Current Barriers:  . Polypharmacy . Impaired cognitive ability (per spouse pt has some short term memory loss/confusion)  Nurse Case Manager  & PharmD Clinical Goal(s): Over the next 30 days, patient/caregiver will transition to blister pill pack and patient will report taking his medications exactly as prescribed w/o missed doses. 04/15/19 Unknown  04/15/19: Over the next 60 days, patient will work with the embedded PharmD Lottie Dawson to review proper use of the pill package system and will follow her recommendations for Self administering his medications exactly as directed.   CCM RN CM Interventions:  04/15/19 Call completed with spouse Dylan [name]   Assessed for completion of transition to pill package system (spouse confirms this has been put into place)  Assessed for questions or concerns related to current medication regimen (spouse is concerned about the patient removing the pills from the packaging, she is unsure if he is actually taking the meds as prescribed)  Discussed having patient come into the  office for a face to face visit with CCM PharmD to review proper use of pill packaging system - spouse agrees  Referral sent to embedded PharmD Lottie Dawson with an update and request to schedule a face to face visit to establish patient is using pill packaging system correctly  Discussed plans with patient for ongoing care management follow up and provided patient with direct contact information for care management team  CCM PharmD Interventions:  05/10/19 Call completed with spouse Dylan [name] & patient . FYIs-->phone number to company ([place] [facility] mail order [phone], order [phone]).  . Wife is more familiar with pill packaging system, however patient has stated taking pills out of packs and getting confused.  Wife states she just "doesn't know what to do".  Encourage wife to attempt to provide direct observed medication administration for patient for AM and PM medications.  Also, encouraged wife to place pill packages out of patient's reach so he is not able to take pills out of packets during the day.   . Discussed other pill packaging systems for patient: o [employer] [facility] in [place] have pill cards to offer a different method of organization. o [facility] [employer] [facility] [employer] offers weekly medication system with alarm and lock for medication administration . Encouraged patient & wife to come in the office to meet face to face if additional questions arise or more information is needed for alternate medication organization methods.  Wife states she will think about alternative methods. . Will follow up next month  Patient Self Care Activities:   Spouse  verbalizes understanding related to instructions on how, where and when [facility] system  Spouse verbalizes understanding about who to call for medication questions/concerns (pharmacy, PCP office or CCM team)   Please see past updates related  to this goal by clicking on the "Past Updates" button in the selected goal           Plan:   The care management team will reach out to the patient again over the next month.  Regina Eck, PharmD, BCPS Clinical Pharmacist, [facility] [place]: [phone]

## 2019-05-12 NOTE — Patient Instructions (Signed)
Visit Information  Goals Addressed            This Visit's Progress   . "I'm worried he's not filling his pill box correctly"       Current Barriers:  . Polypharmacy . Impaired cognitive ability (per spouse pt has some short term memory loss/confusion)  Nurse Case Manager  & PharmD Clinical Goal(s): Over the next 30 days, patient/caregiver will transition to blister pill pack and patient will report taking his medications exactly as prescribed w/o missed doses. 04/15/19 Unknown  04/15/19: Over the next 60 days, patient will work with the embedded PharmD Dylan [name] to review proper use of the pill package system and will follow her recommendations for Self administering his medications exactly as directed.   CCM RN CM Interventions:  04/15/19 Call completed with spouse Dylan [name]   Assessed for completion of transition to pill package system (spouse confirms this has been put into place)  Assessed for questions or concerns related to current medication regimen (spouse is concerned about the patient removing the pills from the packaging, she is unsure if he is actually taking the meds as prescribed)  Discussed having patient come into the office for a face to face visit with CCM PharmD to review proper use of pill packaging system - spouse agrees  Referral sent to embedded PharmD Dylan [name] with an update and request to schedule a face to face visit to establish patient is using pill packaging system correctly  Discussed plans with patient for ongoing care management follow up and provided patient with direct contact information for care management team  CCM PharmD Interventions:  05/10/19 Call completed with spouse Dylan [name] & patient . FYIs-->phone number to company ([place] pill packaging mail order [phone], order [phone]).  . Wife is more familiar with pill packaging system, however patient has stated taking pills out of packs and getting confused.  Wife  states she just "doesn't know what to do".  Encourage wife to attempt to provide direct observed medication administration for patient for AM and PM medications.  Also, encouraged wife to place pill packages out of patient's reach so he is not able to take pills out of packets during the day.   . Discussed other pill packaging systems for patient: o [employer] in [place] have pill cards to offer a different method of organization. o [place] [facility] [employer] offers weekly medication system with alarm and lock for medication administration . Encouraged patient & wife to come in the office to meet face to face if additional questions arise or more information is needed for alternate medication organization methods.  Wife states she will think about alternative methods. . Will follow up next month  Patient Self Care Activities:   Spouse verbalizes understanding related to instructions on how, where and when to take patient's meds to [facility] for preparation of pill packaging system  Spouse verbalizes understanding about who to call for medication questions/concerns (pharmacy, PCP office or CCM team)   Please see past updates related to this goal by clicking on the "Past Updates" button in the selected goal          The patient verbalized understanding of instructions provided today and declined a print copy of patient instruction materials.   The care management team will reach out to the patient again over the next month.  Regina Eck, PharmD, BCPS Clinical Pharmacist, [facility]  Direct Dial: [phone]

## 2019-05-13 ENCOUNTER — Telehealth: Payer: Self-pay

## 2019-05-13 NOTE — Telephone Encounter (Signed)
-----   Message from [name], MD sent at 04/28/2019  9:44 PM EDT ----- Your kidney fxn has worsened. Be sure to stay well hydrated. Your hba1c has worsened. Are you still taking your meds. You get meds in pill packs correct? Do you take all meds included in the pill packs?

## 2019-05-13 NOTE — Telephone Encounter (Signed)
Left the patient a message to call back for lab results. 

## 2019-05-14 ENCOUNTER — Ambulatory Visit: Payer: Medicare HMO

## 2019-05-14 DIAGNOSIS — R296 Repeated falls: Secondary | ICD-10-CM

## 2019-05-14 DIAGNOSIS — E1122 Type 2 diabetes mellitus with diabetic chronic kidney disease: Secondary | ICD-10-CM

## 2019-05-14 DIAGNOSIS — R413 Other amnesia: Secondary | ICD-10-CM

## 2019-05-14 DIAGNOSIS — N182 Chronic kidney disease, stage 2 (mild): Secondary | ICD-10-CM

## 2019-05-14 NOTE — Chronic Care Management (AMB) (Signed)
  Chronic Care Management   Social Work Follow Up Note  05/14/2019 Name: Dylan [name]. MRN: [id number] DOB: Jul 13, 1941  Dylan [name]. is a 78 y.o. year old male who is a primary care patient of [name], MD. The CCM team was consulted for assistance with [employer].   Review of patient status, including review of consultants reports, other relevant assessments, and collaboration with appropriate care team members and the patient's provider was performed as part of comprehensive patient evaluation and provision of chronic care management services.     Goals Addressed            This Visit's Progress   . COMPLETED: "He needs to be more active"       Fiance Stated Current Barriers:  . Social Isolation . Lacks knowledge of community resource: Nurse, adult   . COVID-19  Clinical Social Work Clinical Goal(s):  [name] Over the next 60 days, patient will work with SW to address needs related to engagement in community programs  Interventions: . Outbound call to the patients home, spoke with [name] whom is listed on the patients DPR . Reviewed continued barriers to assist with locating senior centers for patient participation due to [place] 19 pandemic . Educated Mrs Arsenio Loader on remote activities available such as online classes offered from [employer] of [place] . Determined Mrs Arsenio Loader is not interested in this option at this time . Advised Mrs Arsenio Loader CCM SW would complete goal due to an inability for the patient to progress through goal based on current COVID 19 precautions . Collaboration with embedded CCM RN Case Manager to communicate closure to CCM SW . Encouraged Mrs Arsenio Loader to contact CCM SW if future SW resources are needed  Patient Self Care Activities:  . Attends all scheduled provider appointments . Performs ADL's independently . Calls provider office for new concerns or questions  Please see past updates related to this goal by  clicking on the "Past Updates" button in the selected goal          Follow Up Plan: No further SW follow up planned at this time. The patient will continue to be active with CCM RN Case Manager   Daneen Schick, BSW, CDP Social Worker, Certified Dementia Practitioner [place] [facility] [phone]  Total time spent performing care coordination and/or care management activities with the patient by phone or face to face = 12 minutes.

## 2019-05-14 NOTE — Patient Instructions (Signed)
Social Worker Visit Information  Goals we discussed today:  Goals Addressed            This Visit's Progress   . COMPLETED: "He needs to be more active"       Fiance Stated Current Barriers:  . Social Isolation . Lacks knowledge of community resource: Nurse, adult   . COVID-19  Clinical Social Work Clinical Goal(s):  [name] Over the next 60 days, patient will work with SW to address needs related to engagement in community programs  Interventions: . Outbound call to the patients home, spoke with [name] whom is listed on the patients DPR . Reviewed continued barriers to assist with locating senior centers for patient participation due to [place] 19 pandemic . Educated Mrs Arsenio Loader on remote activities available such as online classes offered from [employer] of [place] . Determined Mrs Arsenio Loader is not interested in this option at this time . Advised Mrs Arsenio Loader CCM SW would complete goal due to an inability for the patient to progress through goal based on current COVID 19 precautions . Collaboration with embedded CCM RN Case Manager to communicate closure to CCM SW . Encouraged Mrs Arsenio Loader to contact CCM SW if future SW resources are needed  Patient Self Care Activities:  . Attends all scheduled provider appointments . Performs ADL's independently . Calls provider office for new concerns or questions  Please see past updates related to this goal by clicking on the "Past Updates" button in the selected goal          Materials Provided: No: Patient declined  Follow Up Plan: No SW follow up planned at this time. Please call me if future resources are needed.   Daneen Schick, BSW, CDP Social Worker, Certified Dementia Practitioner [place] [facility] [phone]

## 2019-05-21 ENCOUNTER — Telehealth: Payer: Self-pay

## 2019-05-21 NOTE — Telephone Encounter (Signed)
I returned the pt's wife Rory Percy call, she was calling to get the pt's lab results because she got a letter that the office had been trying to contact her about the lab results.  I left the pt's last lab results on the voicemail of [phone] the call back number she left.

## 2019-05-21 NOTE — Telephone Encounter (Signed)
The pt's wife called back about the pt's lab results and she said yes he still gets his medications in the bubble packs and that she's not sure of how he is taking his meds because the pt refuses to let her help him with his meds, that he has been drinking water and that she will make sure he drinks more water.  She also said that the pt fell out on this past Monday.     I left a message that I was calling to let her know that the office got her message and to schedule the pt an appt for evaluation.

## 2019-05-22 ENCOUNTER — Ambulatory Visit: Payer: Medicare HMO | Admitting: Neurology

## 2019-05-22 ENCOUNTER — Other Ambulatory Visit: Payer: Self-pay

## 2019-05-22 ENCOUNTER — Other Ambulatory Visit: Payer: Self-pay | Admitting: Internal Medicine

## 2019-05-22 ENCOUNTER — Encounter: Payer: Self-pay | Admitting: Neurology

## 2019-05-22 VITALS — BP 122/60 | HR 72 | Temp 98.4°F | Ht 68.0 in | Wt 199.0 lb

## 2019-05-22 DIAGNOSIS — R269 Unspecified abnormalities of gait and mobility: Secondary | ICD-10-CM | POA: Diagnosis not present

## 2019-05-22 DIAGNOSIS — F0391 Unspecified dementia with behavioral disturbance: Secondary | ICD-10-CM

## 2019-05-22 DIAGNOSIS — F03918 Unspecified dementia, unspecified severity, with other behavioral disturbance: Secondary | ICD-10-CM | POA: Insufficient documentation

## 2019-05-22 DIAGNOSIS — G309 Alzheimer's disease, unspecified: Secondary | ICD-10-CM | POA: Diagnosis not present

## 2019-05-22 DIAGNOSIS — R413 Other amnesia: Secondary | ICD-10-CM

## 2019-05-22 MED ORDER — MEMANTINE HCL 10 MG PO TABS: 10.0000 mg | ORAL_TABLET | Freq: Two times a day (BID) | ORAL | 11 refills | Status: DC
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
## 2019-05-23 ENCOUNTER — Telehealth: Payer: Self-pay | Admitting: Neurology

## 2019-05-23 ENCOUNTER — Telehealth: Payer: Self-pay

## 2019-05-23 NOTE — Telephone Encounter (Signed)
[employer] medicare order sent to [facility]. They will obtain the auth and reach out to the patient to schedule.  °

## 2019-05-27 ENCOUNTER — Ambulatory Visit: Payer: Self-pay

## 2019-05-27 ENCOUNTER — Ambulatory Visit (INDEPENDENT_AMBULATORY_CARE_PROVIDER_SITE_OTHER): Payer: Medicare HMO | Admitting: Orthotics

## 2019-05-27 ENCOUNTER — Other Ambulatory Visit: Payer: Self-pay

## 2019-05-27 DIAGNOSIS — N182 Chronic kidney disease, stage 2 (mild): Secondary | ICD-10-CM

## 2019-05-27 DIAGNOSIS — E1122 Type 2 diabetes mellitus with diabetic chronic kidney disease: Secondary | ICD-10-CM

## 2019-05-27 DIAGNOSIS — R413 Other amnesia: Secondary | ICD-10-CM

## 2019-05-27 DIAGNOSIS — E1151 Type 2 diabetes mellitus with diabetic peripheral angiopathy without gangrene: Secondary | ICD-10-CM

## 2019-05-27 DIAGNOSIS — R269 Unspecified abnormalities of gait and mobility: Secondary | ICD-10-CM

## 2019-05-27 DIAGNOSIS — M2012 Hallux valgus (acquired), left foot: Secondary | ICD-10-CM

## 2019-05-27 DIAGNOSIS — I1310 Hypertensive heart and chronic kidney disease without heart failure, with stage 1 through stage 4 chronic kidney disease, or unspecified chronic kidney disease: Secondary | ICD-10-CM

## 2019-05-27 DIAGNOSIS — L02611 Cutaneous abscess of right foot: Secondary | ICD-10-CM

## 2019-05-27 NOTE — Chronic Care Management (AMB) (Signed)
  Chronic Care Management   Outreach Note  05/27/2019 Name: Dylan [name]. MRN: [id number] DOB: 1941/03/21  Referred by: [name], MD Reason for referral : Chronic Care Management (CCM RNCM Telephone Follow up )   An unsuccessful telephone outreach was attempted today. The patient was referred to the case management team by [name] MD for assistance with chronic care management and care coordination.   Follow Up Plan: Telephone follow up appointment with care management team member scheduled for: 06/14/19  [name], RN, BSN, CCM Care Management Coordinator [place] [facility]  Direct Phone: [phone]

## 2019-05-27 NOTE — Telephone Encounter (Signed)
Patient has a pacemaker do you want him to have a MRI or do you want to change the order to a CT?

## 2019-05-27 NOTE — Addendum Note (Signed)
Addended by: Marcial Pacas on: 05/27/2019 02:18 PM   Modules accepted: Orders

## 2019-05-27 NOTE — Telephone Encounter (Signed)
I changed to CT head wo

## 2019-05-27 NOTE — Telephone Encounter (Signed)
Noted, will send the order to [facility] they will reach out to the patient to schedule.  °

## 2019-05-27 NOTE — Progress Notes (Signed)

## 2019-05-28 ENCOUNTER — Ambulatory Visit: Payer: Self-pay

## 2019-05-28 DIAGNOSIS — E1122 Type 2 diabetes mellitus with diabetic chronic kidney disease: Secondary | ICD-10-CM

## 2019-05-28 DIAGNOSIS — N183 Chronic kidney disease, stage 3 unspecified: Secondary | ICD-10-CM

## 2019-05-28 DIAGNOSIS — N182 Chronic kidney disease, stage 2 (mild): Secondary | ICD-10-CM

## 2019-05-28 DIAGNOSIS — I1310 Hypertensive heart and chronic kidney disease without heart failure, with stage 1 through stage 4 chronic kidney disease, or unspecified chronic kidney disease: Secondary | ICD-10-CM

## 2019-05-28 NOTE — Chronic Care Management (AMB) (Signed)
Chronic Care Management   Follow Up Note   05/28/2019 Name: Dylan [name]. MRN: [id number] DOB: December 09, 1940  Referred by: [name], MD Reason for referral : Chronic Care Management (CCM RNCM Telephone Follow up )   Dylan [name]. is a 78 y.o. year old male who is a primary care patient of [name], MD. The CCM team was consulted for assistance with chronic disease management and care coordination needs.    Review of patient status, including review of consultants reports, relevant laboratory and other test results, and collaboration with appropriate care team members and the patient's provider was performed as part of comprehensive patient evaluation and provision of chronic care management services.    I spoke with patient's spouse [name] by telephone today to f/u on Dylan [name] and recent Neuro consultation.   Goals Addressed    . COMPLETED: "He had 2 recent falls at home"       Spouse stated  Current Barriers:   Knowledge Deficits related to Falls/Fall Risk Reduction Strategies   Nurse Case Manager Clinical Goal(s):   Over the next 30 days, patient/spouse will report patient having no falls or near falls. Goal Met   Over the next 30 days, patient will have no ED visits or IP events secondary to falls. Goal Met  CCM RN CM Interventions:  05/28/19 Call completed with spouse [name]   . Assessed for falls or near falls - spouse states Mr. Richter continues to have balance issues but reports no falls - he refuses to use a cane  . Discussed Dylan [name] completed his Neuro consultation and his balance issues are noted to be related to his vascular [name]- see new goal  . Discussed Dylan [name] and spouse having no further concerns at this time related to physical mobility and impaired gait . Encouraged spouse to report any new or worsening difficulty with patient's mobility and or balance . Encouraged spouse to report any/all falls to the  CCM team and or PCP promptly  . Discussed plans with patient for ongoing care management follow up and provided patient with direct contact information for care management team  Patient Self Care Activities:   Spouse verbalizes understanding of education/instructions provided . Calls provider office for new concerns or questions  Please see past updates related to this goal by clicking on the "Past Updates" button in the selected goal       . "I don't know what the next steps are for Dylan [name]"       Spouse stated  Current Barriers:  Dylan [name] Knowledge Deficits related to next steps for diagnosis and treatment for new diagnosis of [name]  Nurse Case Manager Clinical Goal(s):  Dylan [name] Over the next 14 days, spouse will collaborative with the Neuro office and receive instructions regarding brain imaging ordered for determination of type of [name] . Over the next 30 days, patient will verbalize understanding of plan for treatment management for new dx: [name]  CCM RN CM Interventions:  05/28/19: call completed with spouse [name]  . Evaluation of current treatment plan related to new dx: [name] and patient's adherence to plan as established by provider. . Provided education to patient re: disease process related to [name]; discussed types and symptoms suggestive of progression including change in behavior, mood, physical mobility and gait, worsening memory loss and impaired judgement . Reviewed medications with patient and discussed newly prescribed medication used to treat [name]; Namenda; discussed indication for use, frequency and dosage; informed  spouse this medication can also be prescribed in a patch . Discussed plans with patient for ongoing care management follow up and provided patient with direct contact information for care management team . Provided patient with printed educational materials related to [name]  Patient Self Care Activities with  assistance/supervision from spouse . Self administers medications as prescribed . Attends all scheduled provider appointments . Performs ADL's independently . Performs IADL's independently  Initial goal documentation     . COMPLETED: "I'm worried he's not filling his pill box correctly"       Current Barriers:  . Polypharmacy . Impaired cognitive ability (per spouse pt has some short term memory loss/confusion)  Nurse Case Manager  & PharmD Clinical Goal(s): Over the next 30 days, patient/caregiver will transition to blister pill pack and patient will report taking his medications exactly as prescribed w/o missed doses. 04/15/19 Unknown  04/15/19: Over the next 60 days, patient will work with the embedded PharmD Lottie Dawson to review proper use of the pill package system and will follow her recommendations for Self administering his medications exactly as directed. Goal Met  CCM RN CM Interventions:  05/28/19 Call completed with spouse [name]   Assessed for medication adherence and patient satisfaction with his pill packs - spouse reports patient is adhering but continues to remove the pills from the package and place in one large pile prior to administering - he is not including her in the administration   Assessed for questions or concerns related to current medication regimen - spouse denies  Discussed new medication added by Neurologist; Namenda 10 mg twice daily - spouse states she will make sure Dylan [name] has this medication on hand and is taking as directed  Discussed plans with patient for ongoing care management follow up and provided patient with direct contact information for care management team  Patient Self Care Activities:   Spouse verbalizes understanding related to instructions on how, where and when to take patient's meds to [facility] for preparation of pill packaging system  Spouse verbalizes understanding about who to call for medication  questions/concerns (pharmacy, PCP office or CCM team)  Please see past updates related to this goal by clicking on the "Past Updates" button in the selected goal          Telephone follow up appointment with care management team member scheduled for: 06/14/19   [name], RN, BSN, CCM Care Management Coordinator [facility]  Direct Phone: [phone]

## 2019-05-28 NOTE — Patient Instructions (Signed)
Visit Information  Goals Addressed    . COMPLETED: "He had 2 recent falls at home"       Spouse stated  Current Barriers:   Knowledge Deficits related to Falls/Fall Risk Reduction Strategies   Nurse Case Manager Clinical Goal(s):   Over the next 30 days, patient/spouse will report patient having no falls or near falls. Goal Met   Over the next 30 days, patient will have no ED visits or IP events secondary to falls. Goal Met  CCM RN CM Interventions:  05/28/19 Call completed with spouse Gloria Gilliam   . Assessed for falls or near falls - spouse states Mr. Minar continues to have balance issues but reports no falls - he refuses to use a cane  . Discussed Mr. 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 Discussed Mr. Gotsch and spouse having no further concerns at this time related to physical mobility and impaired gait . Encouraged spouse to report any new or worsening difficulty with patient's mobility and or balance . Encouraged spouse to report any/all falls to the CCM team and or PCP promptly  . Discussed plans with patient for ongoing care management follow up and provided patient with direct contact information for care management team  Patient Self Care Activities:   Spouse verbalizes understanding of education/instructions provided . Calls provider office for new concerns or questions  Please see past updates related to this goal by clicking on the "Past Updates" button in the selected goal      . "I don't know what the next steps are for Mr. Loux's Dementia"       Spouse stated  Current Barriers:  . Knowledge Deficits related to next steps for diagnosis and treatment for new diagnosis of Dementia  Nurse Case Manager Clinical Goal(s):  . Over the next 14 days, spouse will collaborative with the Neuro office and receive instructions regarding brain imaging ordered for  determination of type of Dementia . Over the next 30 days, patient will verbalize understanding of plan for treatment management for new dx: Dementia  CCM RN CM Interventions:  05/28/19: call completed with spouse Gloria  . Evaluation of current treatment plan related to new dx: Dementia and patient's adherence to plan as established by provider. . Provided education to patient re: disease process related to Dementia; discussed types and symptoms suggestive of progression including change in behavior, mood, physical mobility and gait, worsening memory loss and impaired judgement . Reviewed medications with patient and discussed newly prescribed medication used to treat Dementia; Namenda; discussed indication for use, frequency and dosage; informed spouse this medication can also be prescribed in a patch . Discussed plans with patient for ongoing care management follow up and provided patient with direct contact information for care management team . Provided patient with printed educational materials related to Dementia  Patient Self Care Activities with assistance/supervision from spouse . Self administers medications as prescribed . Attends all scheduled provider appointments . Performs ADL's independently . Performs IADL's independently  Initial goal documentation     . COMPLETED: "I'm worried he's not filling his pill box correctly"       Current Barriers:  . Polypharmacy . Impaired cognitive ability (per spouse pt has some short term memory loss/confusion)  Nurse Case Manager  & PharmD Clinical Goal(s): Over the next 30 days, patient/caregiver will transition to blister pill pack and patient will report taking his medications   exactly as prescribed w/o missed doses. 04/15/19 Unknown  04/15/19: Over the next 60 days, patient will work with the embedded PharmD Julie Pruitt to review proper use of the pill package system and will follow her recommendations for Self administering his  medications exactly as directed. Goal Met  CCM RN CM Interventions:  05/28/19 Call completed with spouse Gloria Gilliam   Assessed for medication adherence and patient satisfaction with his pill packs - spouse reports patient is adhering but continues to remove the pills from the package and place in one large pile prior to administering - he is not including her in the administration   Assessed for questions or concerns related to current medication regimen - spouse denies  Discussed new medication added by Neurologist; Namenda 10 mg twice daily - spouse states she will make sure Mr. Fagerstrom has this medication on hand and is taking as directed  Discussed plans with patient for ongoing care management follow up and provided patient with direct contact information for care management team  Patient Self Care Activities:   Spouse verbalizes understanding related to instructions on how, where and when to take [facility] for [facility] of [facility] packaging system  Spouse verbalizes understanding about who to call for medication questions/concerns (pharmacy, PCP office or CCM team)  Please see past updates related to this goal by clicking on the "Past Updates" button in the selected goal        The patient verbalized understanding of instructions provided today and declined a print copy of patient instruction materials.   Telephone follow up appointment with care management team member scheduled for: 06/14/19   , RN, BSN, CCM Care Management Coordinator [facility]  Direct Phone: [phone]    

## 2019-05-31 ENCOUNTER — Other Ambulatory Visit: Payer: Self-pay | Admitting: Internal Medicine

## 2019-06-03 ENCOUNTER — Telehealth: Payer: Self-pay

## 2019-06-05 ENCOUNTER — Ambulatory Visit: Payer: Self-pay

## 2019-06-05 DIAGNOSIS — N182 Chronic kidney disease, stage 2 (mild): Secondary | ICD-10-CM

## 2019-06-05 DIAGNOSIS — E1122 Type 2 diabetes mellitus with diabetic chronic kidney disease: Secondary | ICD-10-CM

## 2019-06-05 DIAGNOSIS — I1310 Hypertensive heart and chronic kidney disease without heart failure, with stage 1 through stage 4 chronic kidney disease, or unspecified chronic kidney disease: Secondary | ICD-10-CM

## 2019-06-05 DIAGNOSIS — R413 Other amnesia: Secondary | ICD-10-CM
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
## 2019-06-07 ENCOUNTER — Ambulatory Visit
Admission: RE | Admit: 2019-06-07 | Discharge: 2019-06-07 | Disposition: A | Discharge status: Home / Self Care | Payer: Medicare HMO | Source: Ambulatory Visit | Attending: Neurology | Admitting: Neurology

## 2019-06-07 DIAGNOSIS — R413 Other amnesia: Secondary | ICD-10-CM

## 2019-06-10 ENCOUNTER — Telehealth: Payer: Self-pay | Admitting: Neurology

## 2019-06-10 ENCOUNTER — Ambulatory Visit: Payer: Self-pay

## 2019-06-10 DIAGNOSIS — N183 Chronic kidney disease, stage 3 unspecified: Secondary | ICD-10-CM

## 2019-06-10 DIAGNOSIS — E1122 Type 2 diabetes mellitus with diabetic chronic kidney disease: Secondary | ICD-10-CM

## 2019-06-10 DIAGNOSIS — I1310 Hypertensive heart and chronic kidney disease without heart failure, with stage 1 through stage 4 chronic kidney disease, or unspecified chronic kidney disease: Secondary | ICD-10-CM

## 2019-06-10 DIAGNOSIS — R413 Other amnesia: Secondary | ICD-10-CM

## 2019-06-10 NOTE — Telephone Encounter (Signed)
Please call patient, CAT scan of the brain showed generalized atrophy consistent with his complaints of memory loss, will review films at next follow-up visit  IMPRESSION: This CT scan of the head without contrast shows the following: 1.   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 2.   Mild chronic microvascular ischemic changes. 3.   There are no acute findings.

## 2019-06-10 NOTE — Telephone Encounter (Signed)
Called and spoke with Dylan [name] (on [facility]) about results per Dr. Krista Blue note. She verbalized understanding. Reminded them f/u is 09/18/19 at 315pm.

## 2019-06-10 NOTE — Chronic Care Management (AMB) (Signed)
Chronic Care Management   Follow Up Note   06/10/2019 Name: Dylan [name]. MRN: [id number] DOB: August 24, 1941  Referred by: Dylan Chard, MD Reason for referral : Chronic Care Management (CCM RNCM Telephone Follow up )   Dylan Ina. is a 78 y.o. year old male who is a primary care patient of Dylan Chard, MD. The CCM team was consulted for assistance with chronic disease management and care coordination needs.    Review of patient status, including review of consultants reports, relevant laboratory and other test results, and collaboration with appropriate care team members and the patient's provider was performed as part of comprehensive patient evaluation and provision of chronic care management services.   I spoke with [name] [name] today by telephone to assess for new or worsening dementia and or dementia related mood disturbance from Dylan [name].    Goals Addressed    . "I don't know what the next steps are for Dylan [name] Dementia"       Spouse stated  Current Barriers:  [name] Knowledge Deficits related to next steps for diagnosis and treatment for new diagnosis of Dementia  Nurse Case Manager Clinical Goal(s):  [name] Over the next 14 days, spouse will collaborative with the Neuro office and receive instructions regarding brain imaging ordered for determination of type of Dementia - Goal Met  . Over the next 30 days, patient will verbalize understanding of plan for treatment management for new dx: Dementia  CCM RN CM Interventions:  06/10/19: call completed with spouse Dylan [name]  . Discussed further changes noted in Dylan [name] behavior, balance and mood - [name] [name] states "he's been pretty good" and denies new or worsening symptoms since our last contact on 06/05/19 . Discussed the benefits of having an emergency alert system put into place for Dylan [name] . Discussed how this service works, discussed patient can access 911 if needed for an emergency . Discussed there is a  monthly service charge for this service, advised some health plans offer this service as a covered benefit . Discussed having embedded [place] notify her and Dylan [name] to discuss their options for this service, including cost and how to initiate . Sent inbasket message to [place] requesting resources and information concerning emergency alert system needed for patient - requested she outreach to spouse Dylan [name] to provide such resources . Discussed plans with patient for ongoing care management follow up and provided patient with direct contact information for care management team   06/10/19 Reviewed brain CT image results from 06/07/19 as ordered by Neurologist, Dylan [name]  Noted the following IMPRESSION:  This CT scan of the head without contrast shows the following: 1.   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 2.   Mild chronic microvascular ischemic changes. 3.   There are no acute findings.  Results not discussed with spouse today. Per [name] [name] she will call to schedule a follow up with the Dylan [name] to review the CT results.    Patient Self Care Activities with assistance/supervision from spouse . Self administers medications as prescribed . Attends all scheduled provider appointments . Performs ADL's independently . Performs IADL's independently  Please see past updates related to this goal by clicking on the "Past Updates" button in the selected goal          Telephone follow up appointment with care management team member scheduled for: 06/14/19   Dylan Merino, RN, BSN, CCM Care Management  Coordinator [facility]  Direct Phone: [phone]    

## 2019-06-10 NOTE — Patient Instructions (Signed)
Visit Information  Goals Addressed    . "I don't know what the next steps are for Mr. Tunney Dementia"       Spouse stated  Current Barriers:  [name] Knowledge Deficits related to next steps for diagnosis and treatment for new diagnosis of Dementia  Nurse Case Manager Clinical Goal(s):  [name] Over the next 14 days, spouse will collaborative with the Neuro office and receive instructions regarding brain imaging ordered for determination of type of Dementia - Goal Met  . Over the next 30 days, patient will verbalize understanding of plan for treatment management for new dx: Dementia  CCM RN CM Interventions:  06/10/19: call completed with spouse [name]  . Discussed further changes noted in Mr. Vallery behavior, balance and mood - Ms. [name] states "he's been pretty good" and denies new or worsening symptoms since our last contact on 06/05/19 . Discussed the benefits of having an emergency alert system put into place for Mr. Deyoung . Discussed how this service works, discussed patient can access 911 if needed for an emergency . Discussed there is a monthly service charge for this service, advised some health plans offer this service as a covered benefit . Discussed having embedded [employer] notify her and Mr. Aston to discuss their options for this service, including cost and how to initiate . Sent inbasket message to [place] requesting resources and information concerning emergency alert system needed for patient - requested she outreach to spouse [name] to provide such resources . Discussed plans with patient for ongoing care management follow up and provided patient with direct contact information for care management team  06/10/19 Reviewed brain CT image results from 06/07/19 as ordered by Neurologist, Dr. Krista Blue  Noted the following IMPRESSION:  This CT scan of the head without contrast shows the following: 1.   Mild cortical atrophy mostly in the mesial temporal  lobes and parietal lobes. 2.   Mild chronic microvascular ischemic changes. 3.   There are no acute findings.  Results not discussed with spouse today. Per [name] she will call to schedule a follow up with the Dr. Krista Blue to review the CT results.    Patient Self Care Activities with assistance/supervision from spouse . Self administers medications as prescribed . Attends all scheduled provider appointments . Performs ADL's independently . Performs IADL's independently  Please see past updates related to this goal by clicking on the "Past Updates" button in the selected goal         The patient verbalized understanding of instructions provided today and declined a print copy of patient instruction materials.   Telephone follow up appointment with care management team member scheduled for: 06/14/19  [name], RN, BSN, CCM Care Management Coordinator [place] [facility]  Direct Phone: [phone]

## 2019-06-11 ENCOUNTER — Telehealth: Payer: Self-pay

## 2019-06-12 ENCOUNTER — Telehealth: Payer: Self-pay

## 2019-06-13 ENCOUNTER — Telehealth: Payer: Self-pay | Admitting: Neurology

## 2019-06-13 ENCOUNTER — Ambulatory Visit: Payer: Medicare HMO

## 2019-06-13 ENCOUNTER — Ambulatory Visit (INDEPENDENT_AMBULATORY_CARE_PROVIDER_SITE_OTHER): Payer: Medicare HMO

## 2019-06-13 ENCOUNTER — Other Ambulatory Visit: Payer: Self-pay | Admitting: Neurology

## 2019-06-13 ENCOUNTER — Telehealth: Payer: Medicare HMO

## 2019-06-13 ENCOUNTER — Other Ambulatory Visit: Payer: Self-pay

## 2019-06-13 DIAGNOSIS — N183 Chronic kidney disease, stage 3 unspecified: Secondary | ICD-10-CM

## 2019-06-13 DIAGNOSIS — R413 Other amnesia: Secondary | ICD-10-CM

## 2019-06-13 DIAGNOSIS — N182 Chronic kidney disease, stage 2 (mild): Secondary | ICD-10-CM | POA: Diagnosis not present

## 2019-06-13 DIAGNOSIS — I1310 Hypertensive heart and chronic kidney disease without heart failure, with stage 1 through stage 4 chronic kidney disease, or unspecified chronic kidney disease: Secondary | ICD-10-CM | POA: Diagnosis not present

## 2019-06-13 DIAGNOSIS — E1122 Type 2 diabetes mellitus with diabetic chronic kidney disease: Secondary | ICD-10-CM | POA: Diagnosis not present

## 2019-06-13 NOTE — Telephone Encounter (Signed)
Pt's wife states that ever since he started memantine (NAMENDA) 10 MG tablet he has been processing a bit slower amongst other things she has noticed. She would like the NP or Provider to call her to discuss.

## 2019-06-13 NOTE — Patient Instructions (Signed)
Visit Information  Goals Addressed    . "His balance is much worse and he needs a walker"       Spouse states Current Barriers:  . Impaired gait/balance . High Risk for Falls . 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 . Newly diagnosed; Mild chronic microvascular ischemic changes  Nurse Case Manager Clinical Goal(s):  [name] Over the next 30 days, patient will work with the CCM team to address needs related to impaired gait disturbance and high risk for falls  CCM RN CM Interventions:  06/13/19 call completed with spouse [name]  . Evaluation of current treatment plan related to impaired physical mobility and gait disturbance and patient's adherence to plan as established by provider . Discussed patient's Orthopedic follow up this am - it was recommended that Mr. Dickerman use a walker to assist with balance, he was referred to a back surgeon . Collaborated with [name]  regarding spouse's request for in home PT and DME . Discussed plans with patient for ongoing care management follow up and provided patient with direct contact information for care management team  Patient Self Care Activities:  . Currently UNABLE TO independently perform self-care without assistance   Initial goal documentation     . "I don't know what the next steps are for Mr. Kucinski Dementia"       Spouse stated  Current Barriers:  [name] Knowledge Deficits related to next steps for diagnosis and treatment for new diagnosis of Dementia  Nurse Case Manager Clinical Goal(s):  [name] Over the next 14 days, spouse will collaborative with the Neuro office and receive instructions regarding brain imaging ordered for determination of type of Dementia - Goal Met  . Over the next 30 days, patient will verbalize understanding of plan for treatment management for new dx: Dementia  CCM RN CM Interventions:  06/13/19: call completed with spouse [name]  . Discussed  further changes noted in Mr. Mimbs behavior, balance and mood - [name] reports patient "seems to be more confused at times", she reports his processing time is delayed and he is having more episodes of personality change such as increased irritability and agitation at inappropriate situations; she reports "he is holding on more to the furniture when he walks through our house to help with balance", and reports Mr. Penson is having more difficulty with understanding directions or commands given . Discussed [name] received a call from Dr. Rhea Belton office concerning the brain CT results for Mr. Cedar, she verbalizes understanding of the results she was provided . Placed outbound call to the office for Dr. Krista Blue, Neurologist [phone], spoke with Georgeanna Harrison, reported Ms. Lissa Morales symptoms and concern that Mr. Chuong may have experienced a mini stroke and or may be having adverse effects to the Luberta Robertson will send a message to Dr. Krista Blue and ask her to f/u with [name] asap to further discuss . Advised [name] of the discussion and outcome of my call to Dr. Rhea Belton office . Reviewed and discussed s/s suggestive of a Stroke and urged [name] to call 911 if symptoms suggestive of a stroke occur due to this would be a medical emergency - she verbalizes understanding . Confirmed [name] received the FAST sheet previously mailed - she received this information and has it on her refrigerator for quick reference . Counseled [name] on the progressiveness of patient's Dementia with noted brain atrophy and what to expect  .  Provided support and active listening to [name] as she expressed her concerns and worries about how to care for Mr. Azucena in the future, validated Ms. Lissa Morales feelings of concern  . Discussed [name] has several legal questions related to financial assets; provided [name] with the contact name and contact number for [employer] services and  encouraged her to contact this law firm to seek legal advise concerning these questions . Discussed having embedded BSW Daneen Schick contact her to explore respite and caregiver options . Discussed plans with patient for ongoing care management follow up and provided patient with direct contact information for care management team   06/10/19 Reviewed brain CT image results from 06/07/19 as ordered by Neurologist, Dr. Krista Blue  Noted the following IMPRESSION:  This CT scan of the head without contrast shows the following: 1.   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 2.   Mild chronic microvascular ischemic changes. 3.   There are no acute findings.  Results not discussed with spouse today. Per [name] she will call to schedule a follow up with the Dr. Krista Blue to review the CT results.    Patient Self Care Activities with assistance/supervision from spouse . Self administers medications as prescribed . Attends all scheduled provider appointments . Performs ADL's independently . Performs IADL's independently  Please see past updates related to this goal by clicking on the "Past Updates" button in the selected goal         The patient verbalized understanding of instructions provided today and declined a print copy of patient instruction materials.   Telephone follow up appointment with care management team member scheduled for: 06/21/19  [name], RN, BSN, CCM Care Management Coordinator [place] [facility]  Direct Phone: [phone]

## 2019-06-13 NOTE — Telephone Encounter (Signed)
Spoke to his wife who feels he has been more sluggish recently, along with slower thought processes.  He has only been taking memantine since 05/22/2019.  Dr. Krista Blue is aware and would like him to try the medication a little longer. His wife is in agreement with this plan.  She understands to call our office back with any further concerns.  If she decides to stop the medication, says she will also call to notify us.

## 2019-06-13 NOTE — Chronic Care Management (AMB) (Signed)
Chronic Care Management   Follow Up Note   06/13/2019 Name: Blair Lundeen. MRN: [id number] DOB: 1941-04-15  Referred by: [name], MD Reason for referral : Chronic Care Management (CCM RNCM Telephone Follow up )   [name] is a 78 y.o. year old male who is a primary care patient of [name], MD. The CCM team was consulted for assistance with chronic disease management and care coordination needs.    Review of patient status, including review of consultants reports, relevant laboratory and other test results, and collaboration with appropriate care team members and the patient's provider was performed as part of comprehensive patient evaluation and provision of chronic care management services.    I spoke with patient's spouse [name] by telephone today to discuss her concerns related to his Dementia and balance.   Outpatient Encounter Medications as of 06/13/2019  Medication Sig Note  . allopurinol (ZYLOPRIM) 100 MG tablet Take 100 mg by mouth daily.    [name] amLODipine (NORVASC) 5 MG tablet TAKE 1 TABLET BY MOUTH EVERY DAY   . Ascorbic Acid (VITAMIN C) 1000 MG tablet Take 1,000 mg by mouth daily.   [name] aspirin 81 MG tablet Take 81 mg by mouth 3 (three) times a week.    . carvedilol (COREG) 12.5 MG tablet Take 1 tablet (12.5 mg total) by mouth 2 (two) times daily.   . Cholecalciferol (VITAMIN D3) 5000 units CAPS Take 5,000 Units by mouth every evening.   . Coenzyme Q10 200 MG capsule Take 200 mg by mouth daily.   [name] COLCRYS 0.6 MG tablet Take 1 tablet (0.6 mg total) by mouth daily. 12/12/2018: Takes as needed for gout flare.   . Dulaglutide (TRULICITY) 1.5 ZR/0.0TM SOPN Inject 1.5 mg into the skin once a week.   [name] glucose blood test strip 1 each by Other route as needed for other. Use as instructed   . memantine (NAMENDA) 10 MG tablet TAKE 1 TABLET BY MOUTH TWICE A DAY   . metFORMIN (GLUCOPHAGE) 1000 MG tablet TAKE 1 TABLET BY MOUTH TWICE A DAY WITH MEALS   .  pioglitazone (ACTOS) 45 MG tablet TAKE 1 TABLET BY MOUTH EVERY DAY   . pravastatin (PRAVACHOL) 20 MG tablet TAKE 1 TABLET BY ORAL ROUTE EVERY DAY   . valsartan (DIOVAN) 320 MG tablet TAKE 1 TABLET BY MOUTH EVERY DAY    No facility-administered encounter medications on file as of 06/13/2019.     Goals Addressed    . "His balance is much worse and he needs a walker"       Spouse states Current Barriers:  . Impaired gait/balance . High Risk for Falls . 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 . Newly diagnosed; Mild chronic microvascular ischemic changes  Nurse Case Manager Clinical Goal(s):  [name] Over the next 30 days, patient will work with the CCM team to address needs related to impaired gait disturbance and high risk for falls  CCM RN CM Interventions:  06/13/19 call completed with spouse [name]  . Evaluation of current treatment plan related to impaired physical mobility and gait disturbance and patient's adherence to plan as established by provider . Discussed patient's Orthopedic follow up this am - it was recommended that Mr. Moncada use a walker to assist with balance, he was referred to a back surgeon . Collaborated with [name]  regarding spouse's request for in home PT and DME . Discussed plans with patient  for ongoing care management follow up and provided patient with direct contact information for care management team  Patient Self Care Activities:  . Currently UNABLE TO independently perform self-care without assistance   Initial goal documentation     . "I don't know what the next steps are for Mr. Fussell Dementia"       Spouse stated  Current Barriers:  [name] Knowledge Deficits related to next steps for diagnosis and treatment for new diagnosis of Dementia  Nurse Case Manager Clinical Goal(s):  [name] Over the next 14 days, spouse will collaborative with the Neuro office and receive instructions regarding  brain imaging ordered for determination of type of Dementia - Goal Met  . Over the next 30 days, patient will verbalize understanding of plan for treatment management for new dx: Dementia  CCM RN CM Interventions:  06/13/19: call completed with spouse [name]  . Discussed further changes noted in Mr. Kettles behavior, balance and mood - [name] reports patient "seems to be more confused at times", she reports his processing time is delayed and he is having more episodes of personality change such as increased irritability and agitation at inappropriate situations; she reports "he is holding on more to the furniture when he walks through our house to help with balance", and reports Mr. Abdulaziz is having more difficulty with understanding directions or commands given . Discussed [name] received a call from Dr. Rhea Belton office concerning the brain CT results for Mr. Bonura, she verbalizes understanding of the results she was provided . Placed outbound call to the office for Dr. Krista Blue, Neurologist [phone], spoke with Georgeanna Harrison, reported Ms. Lissa Morales symptoms and concern that Mr. Koelling may have experienced a mini stroke and or may be having adverse effects to the Luberta Robertson will send a message to Dr. Krista Blue and ask her to f/u with [name] asap to further discuss . Advised [name] of the discussion and outcome of my call to Dr. Rhea Belton office . Reviewed and discussed s/s suggestive of a Stroke and urged [name] to call 911 if symptoms suggestive of a stroke occur due to this would be a medical emergency - she verbalizes understanding . Confirmed [name] received the FAST sheet previously mailed - she received this information and has it on her refrigerator for quick reference . Counseled [name] on the progressiveness of patient's Dementia with noted brain atrophy and what to expect  . Provided support and active listening to [name] as she expressed her concerns and worries  about how to care for Mr. Atienza in the future, validated Ms. Lissa Morales feelings of concern  . Discussed [name] has several legal questions related to financial assets; provided [name] with the contact name and contact number for [place] services and encouraged her to contact this law firm to seek legal advise concerning these questions . Discussed having embedded BSW Daneen Schick contact her to explore respite and caregiver options . Discussed plans with patient for ongoing care management follow up and provided patient with direct contact information for care management team   06/10/19 Reviewed brain CT image results from 06/07/19 as ordered by Neurologist, Dr. Krista Blue  Noted the following IMPRESSION:  This CT scan of the head without contrast shows the following: 1.   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 2.   Mild chronic microvascular ischemic changes. 3.   There are no acute findings.  Results not  discussed with spouse today. Per [name] she will call to schedule a follow up with the Dr. Krista Blue to review the CT results.    Patient Self Care Activities with assistance/supervision from spouse . Self administers medications as prescribed . Attends all scheduled provider appointments . Performs ADL's independently . Performs IADL's independently  Please see past updates related to this goal by clicking on the "Past Updates" button in the selected goal          Telephone follow up appointment with care management team member scheduled for: 06/21/19   [name], RN, BSN, CCM Care Management Coordinator [place] [facility]  Direct Phone: [phone]

## 2019-06-14 ENCOUNTER — Telehealth: Payer: Medicare HMO

## 2019-06-14 ENCOUNTER — Other Ambulatory Visit: Payer: Self-pay | Admitting: Internal Medicine

## 2019-06-14 DIAGNOSIS — R269 Unspecified abnormalities of gait and mobility: Secondary | ICD-10-CM

## 2019-06-14 DIAGNOSIS — R296 Repeated falls: Secondary | ICD-10-CM

## 2019-06-14 NOTE — Chronic Care Management (AMB) (Signed)
  Chronic Care Management   Social Work Follow Up Note  06/13/2019 Name: Dylan [name]. MRN: [id number] DOB: 23-Nov-1940  Dylan [name]. is a 78 y.o. year old male who is a primary care patient of [name], MD. The CCM team was consulted for assistance with [employer] .   Review of patient status, including review of consultants reports, other relevant assessments, and collaboration with appropriate care team members and the patient's provider was performed as part of comprehensive patient evaluation and provision of chronic care management services.     Goals Addressed            This Visit's Progress   . "I don't know what the next steps are for [name] Dementia"       Spouse stated  Current Barriers:  [name] Knowledge Deficits related to next steps for diagnosis and treatment for new diagnosis of Dementia  Nurse Case Manager Clinical Goal(s):  [name] Over the next 14 days, spouse will collaborative with the Neuro office and receive instructions regarding brain imaging ordered for determination of type of Dementia - Goal Met  . Over the next 30 days, patient will verbalize understanding of plan for treatment management for new dx: Dementia  CCM SW Interventions Completed 06/13/2019 wth [name] . Outbound call to the patients fiance and caregiver in response to communiucation received from RN Case Manager . Assessed for patient eligibility to receive Medicaid and apply for a personal care aide. . Determined the patient is well over the income limit to be eligible for Medicaid benefits . Educated [name] on caregiver resources including private pay and the [facility] in home aide program . Advised [name] CCM SW could place the patient on a waiting list for the in home aide program that could take anywhere from 6-18 months for services - [name] did not express interest during today's call . Reviewed placement options including how to apply  for long term care Medicaid and the process of the patient losing his own income to help pay for placement . Informed by [name] she does not want the patient placed unless absolutely necessary. [name] further expressed concern over the ability to pay for expenses without the patients income . Determined [name] has several questions surrounding seperation of assets if the patient would need to apply for long term care Medicaid and how "safe" her own assets would be since they couple shares a joint account . Advised [name] to contact a lawyer to discuss legalities as this SW could not advise her on this topic . Scheduled outbound call to [name] over the next 5 days to determine interested in caregiver resources discussed above  Patient [place] with assistance/supervision from spouse . Self administers medications as prescribed . Attends all scheduled provider appointments . Performs ADL's independently . Performs IADL's independently  Please see past updates related to this goal by clicking on the "Past Updates" button in the selected goal          Follow Up Plan: SW will follow up with patient by phone over the next 5 days.   Daneen Schick, BSW, CDP Social Worker, Certified Dementia Practitioner [place] [facility] [place] [facility] [phone]  Total time spent performing care coordination and/or care management activities with the patient by phone or face to face = 15 minutes.

## 2019-06-14 NOTE — Patient Instructions (Signed)
Social Worker Visit Information  Goals we discussed today:  Goals Addressed            This Visit's Progress   . "I don't know what the next steps are for Mr. Bois[place] Dementia"       Spouse stated  Current Barriers:  [name] Knowledge Deficits related to next steps for diagnosis and treatment for new diagnosis of Dementia  Nurse Case Manager Clinical Goal(s):  [name] Over the next 14 days, spouse will collaborative with the Neuro office and receive instructions regarding brain imaging ordered for determination of type of Dementia - Goal Met  . Over the next 30 days, patient will verbalize understanding of plan for treatment management for new dx: Dementia  CCM SW Interventions Completed 06/13/2019 wth [name] . Outbound call to the patients fiance and caregiver in response to communiucation received from RN Case Manager . Assessed for patient eligibility to receive Medicaid and apply for a personal care aide. . Determined the patient is well over the income limit to be eligible for Medicaid benefits . Educated [name] on caregiver resources including private pay and the [facility] in home aide program . Advised [name] CCM SW could place the patient on a waiting list for the in home aide program that could take anywhere from 6-18 months for services - [name] did not express interest during today's call . Reviewed placement options including how to apply for long term care Medicaid and the process of the patient losing his own income to help pay for placement . Informed by [name] she does not want the patient placed unless absolutely necessary. [name] further expressed concern over the ability to pay for expenses without the patients income . Determined [name] has several questions surrounding seperation of assets if the patient would need to apply for long term care Medicaid and how "safe" her own assets would be since they couple shares a joint  account . Advised [name] to contact a lawyer to discuss legalities as this SW could not advise her on this topic . Scheduled outbound call to [name] over the next 5 days to determine interested in caregiver resources discussed above  Patient [place] with assistance/supervision from spouse . Self administers medications as prescribed . Attends all scheduled provider appointments . Performs ADL's independently . Performs IADL's independently  Please see past updates related to this goal by clicking on the "Past Updates" button in the selected goal          Follow Up Plan: SW will follow up with patient by phone over the next 5 days   Daneen Schick, BSW, CDP Social Worker, Certified Dementia Practitioner [place] [facility] [place] [facility] [phone]

## 2019-06-20 ENCOUNTER — Ambulatory Visit: Payer: Medicare HMO

## 2019-06-20 DIAGNOSIS — I1310 Hypertensive heart and chronic kidney disease without heart failure, with stage 1 through stage 4 chronic kidney disease, or unspecified chronic kidney disease: Secondary | ICD-10-CM | POA: Diagnosis not present

## 2019-06-20 DIAGNOSIS — N183 Chronic kidney disease, stage 3 (moderate): Secondary | ICD-10-CM | POA: Diagnosis not present

## 2019-06-20 DIAGNOSIS — R413 Other amnesia: Secondary | ICD-10-CM

## 2019-06-20 DIAGNOSIS — E1122 Type 2 diabetes mellitus with diabetic chronic kidney disease: Secondary | ICD-10-CM | POA: Diagnosis not present

## 2019-06-21 ENCOUNTER — Telehealth: Payer: Medicare HMO

## 2019-06-21 ENCOUNTER — Ambulatory Visit: Payer: Self-pay

## 2019-06-21 DIAGNOSIS — I10 Essential (primary) hypertension: Secondary | ICD-10-CM | POA: Diagnosis not present

## 2019-06-21 DIAGNOSIS — N183 Chronic kidney disease, stage 3 unspecified: Secondary | ICD-10-CM

## 2019-06-21 DIAGNOSIS — R413 Other amnesia: Secondary | ICD-10-CM

## 2019-06-21 DIAGNOSIS — E1122 Type 2 diabetes mellitus with diabetic chronic kidney disease: Secondary | ICD-10-CM

## 2019-06-21 NOTE — Chronic Care Management (AMB) (Signed)
  Chronic Care Management   Outreach Note  06/21/2019 Name: Dylan [name]. MRN: [id number] DOB: 1941-05-14  Referred by: [name], MD Reason for referral : Chronic Care Management (CCM RNCM Telephone Follow up )   An unsuccessful telephone outreach was attempted today. I spoke with patient's spouse and caregiver [name] who requested I call her back next week. The patient was referred to the case management team by [name] MD for assistance with chronic care management and care coordination.   Follow Up Plan: Telephone follow up appointment with care management team member scheduled for: 06/27/19 Follow up with provider re: [facility] referral and DME (rollator) 06/24/19  [name], RN, BSN, CCM Care Management Coordinator [place] [facility]  Direct Phone: [phone]

## 2019-06-24 ENCOUNTER — Telehealth: Payer: Self-pay

## 2019-06-25 NOTE — Patient Instructions (Signed)
Visit Information  Goals Addressed            This Visit's Progress   . I would like to continue to optimize his medication management of my chronic conditions.       Wife stated Current Barriers:  . Polypharmacy; complex patient with multiple comorbidities including dementia, diabetes, hypertension . Wife assists with managing medications via [employer] pill packaging system.  Patient continues to take pills out and sometimes does not take them.   Pharmacist Clinical Goal(s):  [name] Over the next 90 days, patient will work with PharmD and provider towards optimized medication management  Interventions: . Comprehensive medication review performed; medication list updated in electronic medical record . Counseled wife on working to oversee pill pack administration as patient takes pills out at times and forgets to take them.  She continues to give patient the Namenda twice daily.  She has not seen a great benefit from this. . Reviewed & discussed the following diabetes-related information with patient: o Continue checking blood sugars as directed o Follow ADA recommended "diabetes-friendly" diet  (reviewed healthy snack/food options) o Confirmed current DM regimen: Metformin, pioglitazone, Trulicity weekly o Discussed GLP-1 injection technique o Reviewed medication purpose/side effects-->patient denies adverse events, denies hypoglycemia, reports FBG<180, most recently in the upper 150s. o Most recent A1c is 8.2% on 04/23/19 (increase from 7.5% on 01/21/19).  Wife working to given medications as prescribed, but receives resistance from patient as he is battling with dementia o Current anti-hypertensive regimen: carvedilol, amlodipine, valsartan o Current anti-hyperlipidemia regimen: pravastatin 20mg  daily o Continue taking all medications as prescribed by provider  .   Patient Self Care Activities:  . Patient will take medications as prescribed with help from wife and pill packaging  system . Patient will focus on improved adherence by allowing wife to assist with pill pack administration .   Initial goal documentation        The patient verbalized understanding of instructions provided today and declined a print copy of patient instruction materials.   The care management team will reach out to the patient again over the next 4-6 weeks.  Regina Eck, PharmD, BCPS Clinical Pharmacist, [facility] [place]: [phone]

## 2019-06-25 NOTE — Progress Notes (Signed)
Chronic Care Management   Visit Note  06/20/2019 Name: Dylan [name]. MRN: [id number] DOB: [birth year]  Referred by: [name], MD Reason for referral : Chronic Care Management   Garlan Drewes. is a 78 y.o. year old male who is a primary care patient of [name], MD. The CCM team was consulted for assistance with chronic disease management and care coordination needs.   Review of patient status, including review of consultants reports, relevant laboratory and other test results, and collaboration with appropriate care team members and the patient's provider was performed as part of comprehensive patient evaluation and provision of chronic care management services.    I spoke with patient's wife, [name], by telephone today.  Patient has dementia.  Medications: Outpatient Encounter Medications as of 06/20/2019  Medication Sig Note  . allopurinol (ZYLOPRIM) 100 MG tablet Take 100 mg by mouth daily.    [name] amLODipine (NORVASC) 5 MG tablet TAKE 1 TABLET BY MOUTH EVERY DAY   . Ascorbic Acid (VITAMIN C) 1000 MG tablet Take 1,000 mg by mouth daily.   [name] aspirin 81 MG tablet Take 81 mg by mouth 3 (three) times a week.    . carvedilol (COREG) 12.5 MG tablet Take 1 tablet (12.5 mg total) by mouth 2 (two) times daily.   . Cholecalciferol (VITAMIN D3) 5000 units CAPS Take 5,000 Units by mouth every evening.   . Coenzyme Q10 200 MG capsule Take 200 mg by mouth daily.   [name] COLCRYS 0.6 MG tablet Take 1 tablet (0.6 mg total) by mouth daily. 12/12/2018: Takes as needed for gout flare.   . Dulaglutide (TRULICITY) 1.5 YQ/6.5HQ SOPN Inject 1.5 mg into the skin once a week.   [name] glucose blood test strip 1 each by Other route as needed for other. Use as instructed   . memantine (NAMENDA) 10 MG tablet TAKE 1 TABLET BY MOUTH TWICE A DAY   . metFORMIN (GLUCOPHAGE) 1000 MG tablet TAKE 1 TABLET BY MOUTH TWICE A DAY WITH MEALS   . pioglitazone (ACTOS) 45 MG tablet TAKE 1 TABLET BY MOUTH EVERY DAY   .  pravastatin (PRAVACHOL) 20 MG tablet TAKE 1 TABLET BY ORAL ROUTE EVERY DAY   . valsartan (DIOVAN) 320 MG tablet TAKE 1 TABLET BY MOUTH EVERY DAY    No facility-administered encounter medications on file as of 06/20/2019.      Objective:   Goals Addressed            This Visit's Progress   . I would like to continue to optimize his medication management of my chronic conditions.       Wife stated Current Barriers:  . Polypharmacy; complex patient with multiple comorbidities including dementia, diabetes, hypertension . Wife assists with managing medications via [employer] pill packaging system.  Patient continues to take pills out and sometimes does not take them.   Pharmacist Clinical Goal(s):  [name] Over the next 90 days, patient will work with PharmD and provider towards optimized medication management  Interventions: . Comprehensive medication review performed; medication list updated in electronic medical record . Counseled wife on working to oversee pill pack administration as patient takes pills out at times and forgets to take them.  She continues to give patient the Namenda twice daily.  She has not seen a great benefit from this. . Reviewed & discussed the following diabetes-related information with patient: o Continue checking blood sugars as directed o Follow ADA recommended "diabetes-friendly" diet  (reviewed healthy snack/food options) o Confirmed current  DM regimen: Metformin, pioglitazone, Trulicity weekly o Discussed GLP-1 injection technique o Reviewed medication purpose/side effects-->patient denies adverse events, denies hypoglycemia, reports FBG<180, most recently in the upper 150s. o Most recent A1c is 8.2% on 04/23/19 (increase from 7.5% on 01/21/19).  Wife working to given medications as prescribed, but receives resistance from patient as he is battling with dementia o Current anti-hypertensive regimen: carvedilol, amlodipine, valsartan o Current anti-hyperlipidemia  regimen: pravastatin 20mg  daily o Continue taking all medications as prescribed by provider  .   Patient Self Care Activities:  . Patient will take medications as prescribed with help from wife and pill packaging system . Patient will focus on improved adherence by allowing wife to assist with pill pack administration .   Initial goal documentation       Plan:   The care management team will reach out to the patient again over the next 4-6 weeks.  Regina Eck, PharmD, BCPS Clinical Pharmacist, [facility] [place]: [phone]

## 2019-06-26 ENCOUNTER — Ambulatory Visit (INDEPENDENT_AMBULATORY_CARE_PROVIDER_SITE_OTHER): Payer: Medicare HMO | Admitting: *Deleted

## 2019-06-26 DIAGNOSIS — I442 Atrioventricular block, complete: Secondary | ICD-10-CM | POA: Diagnosis not present

## 2019-06-26 LAB — CUP PACEART REMOTE DEVICE CHECK
Battery Remaining Longevity: 104 mo
Battery Remaining Percentage: 95.5 %
Battery Voltage: 2.99 V
Brady Statistic AP VP Percent: 6.5 %
Brady Statistic AP VS Percent: 1 %
Brady Statistic AS VP Percent: 93 %
Brady Statistic AS VS Percent: 1 %
Brady Statistic RA Percent Paced: 5.7 %
Brady Statistic RV Percent Paced: 99 %
Date Time Interrogation Session: 20200824060016
Implantable Lead Implant Date: 20180911
Implantable Lead Implant Date: 20180911
Implantable Lead Location: 753859
Implantable Lead Location: 753860
Implantable Pulse Generator Implant Date: 20180911
Lead Channel Impedance Value: 450 Ohm
Lead Channel Impedance Value: 460 Ohm
Lead Channel Pacing Threshold Amplitude: 0.5 V
Lead Channel Pacing Threshold Amplitude: 0.75 V
Lead Channel Pacing Threshold Pulse Width: 0.5 ms
Lead Channel Pacing Threshold Pulse Width: 0.5 ms
Lead Channel Sensing Intrinsic Amplitude: 10.2 mV
Lead Channel Sensing Intrinsic Amplitude: 5 mV
Lead Channel Setting Pacing Amplitude: 2 V
Lead Channel Setting Pacing Amplitude: 2.5 V
Lead Channel Setting Pacing Pulse Width: 0.5 ms
Lead Channel Setting Sensing Sensitivity: 4 mV
Pulse Gen Model: 2272
Pulse Gen Serial Number: 8940191

## 2019-06-27 ENCOUNTER — Telehealth: Payer: Medicare HMO

## 2019-06-27 ENCOUNTER — Ambulatory Visit: Payer: Self-pay

## 2019-06-27 ENCOUNTER — Other Ambulatory Visit: Payer: Self-pay

## 2019-06-27 DIAGNOSIS — I10 Essential (primary) hypertension: Secondary | ICD-10-CM

## 2019-06-27 DIAGNOSIS — N183 Chronic kidney disease, stage 3 unspecified: Secondary | ICD-10-CM

## 2019-06-27 DIAGNOSIS — E1122 Type 2 diabetes mellitus with diabetic chronic kidney disease: Secondary | ICD-10-CM

## 2019-06-28 NOTE — Chronic Care Management (AMB) (Signed)
Chronic Care Management   Follow Up Note   06/27/2019 Name: Dylan [name]. MRN: [id number] DOB: 07-04-1941  Referred by: [name], MD Reason for referral : Chronic Care Management (CCM RNCM Telephone Follow up )   Dylan Ina. is a 78 y.o. year old male who is a primary care patient of [name], MD. The CCM team was consulted for assistance with chronic disease management and care coordination needs.    Review of patient status, including review of consultants reports, relevant laboratory and other test results, and collaboration with appropriate care team members and the patient's provider was performed as part of comprehensive patient evaluation and provision of chronic care management services.    SDOH (Social Determinants of Health) screening performed today: None. See Care Plan for related entries.   I spoke with [name] by telephone today to follow up Dylan [name] plan of care related to his dementia and DME.   Outpatient Encounter Medications as of 06/27/2019  Medication Sig Note  . allopurinol (ZYLOPRIM) 100 MG tablet Take 100 mg by mouth daily.    [name] amLODipine (NORVASC) 5 MG tablet TAKE 1 TABLET BY MOUTH EVERY DAY   . Ascorbic Acid (VITAMIN C) 1000 MG tablet Take 1,000 mg by mouth daily.   [name] aspirin 81 MG tablet Take 81 mg by mouth 3 (three) times a week.    . carvedilol (COREG) 12.5 MG tablet Take 1 tablet (12.5 mg total) by mouth 2 (two) times daily.   . Cholecalciferol (VITAMIN D3) 5000 units CAPS Take 5,000 Units by mouth every evening.   . Coenzyme Q10 200 MG capsule Take 200 mg by mouth daily.   [name] COLCRYS 0.6 MG tablet Take 1 tablet (0.6 mg total) by mouth daily. 12/12/2018: Takes as needed for gout flare.   . Dulaglutide (TRULICITY) 1.5 HB/7.1IR SOPN Inject 1.5 mg into the skin once a week.   [name] glucose blood test strip 1 each by Other route as needed for other. Use as instructed   . memantine (NAMENDA) 10 MG tablet TAKE 1 TABLET BY MOUTH TWICE A  DAY   . metFORMIN (GLUCOPHAGE) 1000 MG tablet TAKE 1 TABLET BY MOUTH TWICE A DAY WITH MEALS   . pioglitazone (ACTOS) 45 MG tablet TAKE 1 TABLET BY MOUTH EVERY DAY   . pravastatin (PRAVACHOL) 20 MG tablet TAKE 1 TABLET BY ORAL ROUTE EVERY DAY   . valsartan (DIOVAN) 320 MG tablet TAKE 1 TABLET BY MOUTH EVERY DAY    No facility-administered encounter medications on file as of 06/27/2019.      Goals Addressed    . "His balance is much worse and he needs a walker"       Spouse states Current Barriers:  . Impaired gait/balance . High Risk for Falls . 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 . Newly diagnosed; Mild chronic microvascular ischemic changes  Nurse Case Manager Clinical Goal(s):  [name] Over the next 30 days, patient will work with the CCM team to address needs related to impaired gait disturbance and high risk for falls  CCM RN CM Interventions:  06/27/19 call completed with spouse [name]  . Evaluation of current treatment plan related to impaired physical mobility and gait disturbance and patient's adherence to plan as established by provider . Discussed [facility] contacted [name] about in home PT services; discussed Dylan [name] refused services . Placed outboud call to [place], confirmed the ordered needed  for a rollator walker has been received, however the patient's height/weight are needed . Sent secure communication to all staff [name]'s requesting this information be faxed to [place] fax # [phone] asap . Spouse is aware to be listening out for a call the [facility] to schedule a pick up time or delivery of patient's walker once ready . Discussed plans with patient for ongoing care management follow up and provided patient with direct contact information for care management team  Patient Self Care Activities:  . Currently UNABLE TO independently perform self-care without assistance   Initial goal  documentation     . "I don't know what the next steps are for Dylan [name] Dementia"       Spouse stated  Current Barriers:  [name] Knowledge Deficits related to next steps for diagnosis and treatment for new diagnosis of Dementia  Nurse Case Manager Clinical Goal(s):  [name] Over the next 14 days, spouse will collaborative with the Neuro office and receive instructions regarding brain imaging ordered for determination of type of Dementia - Goal Met  . Over the next 90 days, patient will verbalize understanding of plan for treatment management for new dx: Dementia  CCM RN CM Interventions:  06/27/19: call completed with spouse [name]  . Discussed further changes noted in Dylan [name] behavior, balance and mood - [name] reports no new changes with patient's cognition, mood or behavior has developed since our last contact . Answered questions related the disease process related to dementia including brain atrophy and how this will effect patient's symptoms and progression of the disease . Provided support and active listening to [name] as she expressed her concerns and worries about how to care for Dylan [name] in the future, validated Ms. Lissa Morales feelings of concern  . Encouraged spouse to notify Dr. Krista Blue of new or worsening s/s of dementia, mood or personality change . Confirmed [name] has the contact # for Dr. Krista Blue; discussed next OV scheduled for 11/18 @ 3:15 PM  . Discussed plans with patient for ongoing care management follow up and provided patient with direct contact information for care management team   06/10/19 Reviewed brain CT image results from 06/07/19 as ordered by Neurologist, Dr. Krista Blue  Noted the following IMPRESSION:  This CT scan of the head without contrast shows the following: 1.   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 2.   Mild chronic microvascular ischemic changes. 3.   There are no acute findings.  Results not  discussed with spouse today. Per [name] she will call to schedule a follow up with the Dr. Krista Blue to review the CT results.   Patient Self Care Activities with assistance/supervision from spouse . Self administers medications as prescribed . Attends all scheduled provider appointments . Performs ADL's independently . Performs IADL's independently  Please see past updates related to this goal by clicking on the "Past Updates" button in the selected goal          Telephone follow up appointment with care management team member scheduled for: 07/03/19   [name], RN, BSN, CCM Care Management Coordinator [place] [facility]  Direct Phone: [phone]

## 2019-06-28 NOTE — Patient Instructions (Signed)
Visit Information  Goals Addressed    . "His balance is much worse and he needs a walker"       Spouse states Current Barriers:  . Impaired gait/balance . High Risk for Falls . 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 . Newly diagnosed; Mild chronic microvascular ischemic changes  Nurse Case Manager Clinical Goal(s):  [name] Over the next 30 days, patient will work with the CCM team to address needs related to impaired gait disturbance and high risk for falls  CCM RN CM Interventions:  06/27/19 call completed with spouse [name]  . Evaluation of current treatment plan related to impaired physical mobility and gait disturbance and patient's adherence to plan as established by provider . Discussed [facility] contacted [name] about in home PT services; discussed Mr. Lavallee refused services . Placed outboud call to [employer], confirmed the ordered needed for a rollator walker has been received, however the patient's height/weight are needed . Sent secure communication to all staff [name]'s requesting this information be faxed to [place] fax # [phone] asap . Spouse is aware to be listening out for a call the [facility] to schedule a pick up time or delivery of patient's walker once ready . Discussed plans with patient for ongoing care management follow up and provided patient with direct contact information for care management team  Patient Self Care Activities:  . Currently UNABLE TO independently perform self-care without assistance   Initial goal documentation     . "I don't know what the next steps are for Mr. Jolley Dementia"       Spouse stated  Current Barriers:  [name] Knowledge Deficits related to next steps for diagnosis and treatment for new diagnosis of Dementia  Nurse Case Manager Clinical Goal(s):  [name] Over the next 14 days, spouse will collaborative with the Neuro office and receive instructions regarding  brain imaging ordered for determination of type of Dementia - Goal Met  . Over the next 90 days, patient will verbalize understanding of plan for treatment management for new dx: Dementia  CCM RN CM Interventions:  06/27/19: call completed with spouse [name]  . Discussed further changes noted in Mr. Favia behavior, balance and mood - [name] reports no new changes with patient's cognition, mood or behavior has developed since our last contact . Answered questions related the disease process related to dementia including brain atrophy and how this will effect patient's symptoms and progression of the disease . Provided support and active listening to [name] as she expressed her concerns and worries about how to care for Mr. Marcella in the future, validated Ms. Lissa Morales feelings of concern  . Encouraged spouse to notify Dr. Krista Blue of new or worsening s/s of dementia, mood or personality change . Confirmed [name] has the contact # for Dr. Krista Blue; discussed next OV scheduled for 11/18 @ 3:15 PM  . Discussed plans with patient for ongoing care management follow up and provided patient with direct contact information for care management team   06/10/19 Reviewed brain CT image results from 06/07/19 as ordered by Neurologist, Dr. Krista Blue  Noted the following IMPRESSION:  This CT scan of the head without contrast shows the following: 1.   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 2.   Mild chronic microvascular ischemic changes. 3.   There are no acute findings.  Results not discussed with spouse today. Per Ms.  Arsenio Loader she will call to schedule a follow up with the Dr. Krista Blue to review the CT results.   Patient Self Care Activities with assistance/supervision from spouse . Self administers medications as prescribed . Attends all scheduled provider appointments . Performs ADL's independently . Performs IADL's independently  Please see past updates related  to this goal by clicking on the "Past Updates" button in the selected goal         The patient verbalized understanding of instructions provided today and declined a print copy of patient instruction materials.   Telephone follow up appointment with care management team member scheduled for: 07/03/19  [name], RN, BSN, CCM Care Management Coordinator [place] [facility]  Direct Phone: [phone]

## 2019-07-01 ENCOUNTER — Ambulatory Visit: Payer: Medicare HMO | Admitting: Podiatry

## 2019-07-01 ENCOUNTER — Encounter: Payer: Self-pay | Admitting: Podiatry

## 2019-07-01 ENCOUNTER — Other Ambulatory Visit: Payer: Self-pay

## 2019-07-01 DIAGNOSIS — E1151 Type 2 diabetes mellitus with diabetic peripheral angiopathy without gangrene: Secondary | ICD-10-CM

## 2019-07-01 DIAGNOSIS — B351 Tinea unguium: Secondary | ICD-10-CM

## 2019-07-01 DIAGNOSIS — M79675 Pain in left toe(s): Secondary | ICD-10-CM | POA: Diagnosis not present

## 2019-07-01 DIAGNOSIS — M79674 Pain in right toe(s): Secondary | ICD-10-CM | POA: Diagnosis not present

## 2019-07-01 NOTE — Patient Instructions (Signed)
Diabetes Mellitus and Foot Care Foot care is an important part of your health, especially when you have diabetes. Diabetes may cause you to have problems because of poor blood flow (circulation) to your feet and legs, which can cause your skin to:  Become thinner and drier.  Break more easily.  Heal more slowly.  Peel and crack. You may also have nerve damage (neuropathy) in your legs and feet, causing decreased feeling in them. This means that you may not notice minor injuries to your feet that could lead to more serious problems. Noticing and addressing any potential problems early is the best way to prevent future foot problems. How to care for your feet Foot hygiene  Wash your feet daily with warm water and mild soap. Do not use hot water. Then, pat your feet and the areas between your toes until they are completely dry. Do not soak your feet as this can dry your skin.  Trim your toenails straight across. Do not dig under them or around the cuticle. File the edges of your nails with an emery board or nail file.  Apply a moisturizing lotion or petroleum jelly to the skin on your feet and to dry, brittle toenails. Use lotion that does not contain alcohol and is unscented. Do not apply lotion between your toes. Shoes and socks  Wear clean socks or stockings every day. Make sure they are not too tight. Do not wear knee-high stockings since they may decrease blood flow to your legs.  Wear shoes that fit properly and have enough cushioning. Always look in your shoes before you put them on to be sure there are no objects inside.  To break in new shoes, wear them for just a few hours a day. This prevents injuries on your feet. Wounds, scrapes, corns, and calluses  Check your feet daily for blisters, cuts, bruises, sores, and redness. If you cannot see the bottom of your feet, use a mirror or ask someone for help.  Do not cut corns or calluses or try to remove them with medicine.  If you  find a minor scrape, cut, or break in the skin on your feet, keep it and the skin around it clean and dry. You may clean these areas with mild soap and water. Do not clean the area with peroxide, alcohol, or iodine.  If you have a wound, scrape, corn, or callus on your foot, look at it several times a day to make sure it is healing and not infected. Check for: ? Redness, swelling, or pain. ? Fluid or blood. ? Warmth. ? Pus or a bad smell. General instructions  Do not cross your legs. This may decrease blood flow to your feet.  Do not use heating pads or hot water bottles on your feet. They may burn your skin. If you have lost feeling in your feet or legs, you may not know this is happening until it is too late.  Protect your feet from hot and cold by wearing shoes, such as at the beach or on hot pavement.  Schedule a complete foot exam at least once a year (annually) or more often if you have foot problems. If you have foot problems, report any cuts, sores, or bruises to your health care provider immediately. Contact a health care provider if:  You have a medical condition that increases your risk of infection and you have any cuts, sores, or bruises on your feet.  You have an injury that is not   healing.  You have redness on your legs or feet.  You feel burning or tingling in your legs or feet.  You have pain or cramps in your legs and feet.  Your legs or feet are numb.  Your feet always feel cold.  You have pain around a toenail. Get help right away if:  You have a wound, scrape, corn, or callus on your foot and: ? You have pain, swelling, or redness that gets worse. ? You have fluid or blood coming from the wound, scrape, corn, or callus. ? Your wound, scrape, corn, or callus feels warm to the touch. ? You have pus or a bad smell coming from the wound, scrape, corn, or callus. ? You have a fever. ? You have a red line going up your leg. Summary  Check your feet every day  for cuts, sores, red spots, swelling, and blisters.  Moisturize feet and legs daily.  Wear shoes that fit properly and have enough cushioning.  If you have foot problems, report any cuts, sores, or bruises to your health care provider immediately.  Schedule a complete foot exam at least once a year (annually) or more often if you have foot problems. This information is not intended to replace advice given to you by your health care provider. Make sure you discuss any questions you have with your health care provider. Document Released: 10/14/2000 Document Revised: 11/29/2017 Document Reviewed: 11/18/2016 Elsevier Patient Education  2020 Elsevier Inc.   Onychomycosis/Fungal Toenails  WHAT IS IT? An infection that lies within the keratin of your nail plate that is caused by a fungus.  WHY ME? Fungal infections affect all ages, sexes, races, and creeds.  There may be many factors that predispose you to a fungal infection such as age, coexisting medical conditions such as diabetes, or an autoimmune disease; stress, medications, fatigue, genetics, etc.  Bottom line: fungus thrives in a warm, moist environment and your shoes offer such a location.  IS IT CONTAGIOUS? Theoretically, yes.  You do not want to share shoes, nail clippers or files with someone who has fungal toenails.  Walking around barefoot in the same room or sleeping in the same bed is unlikely to transfer the organism.  It is important to realize, however, that fungus can spread easily from one nail to the next on the same foot.  HOW DO WE TREAT THIS?  There are several ways to treat this condition.  Treatment may depend on many factors such as age, medications, pregnancy, liver and kidney conditions, etc.  It is best to ask your doctor which options are available to you.  1. No treatment.   Unlike many other medical concerns, you can live with this condition.  However for many people this can be a painful condition and may lead to  ingrown toenails or a bacterial infection.  It is recommended that you keep the nails cut short to help reduce the amount of fungal nail. 2. Topical treatment.  These range from herbal remedies to prescription strength nail lacquers.  About 40-50% effective, topicals require twice daily application for approximately 9 to 12 months or until an entirely new nail has grown out.  The most effective topicals are medical grade medications available through physicians offices. 3. Oral antifungal medications.  With an 80-90% cure rate, the most common oral medication requires 3 to 4 months of therapy and stays in your system for a year as the new nail grows out.  Oral antifungal medications do require   blood work to make sure it is a safe drug for you.  A liver function panel will be performed prior to starting the medication and after the first month of treatment.  It is important to have the blood work performed to avoid any harmful side effects.  In general, this medication safe but blood work is required. 4. Laser Therapy.  This treatment is performed by applying a specialized laser to the affected nail plate.  This therapy is noninvasive, fast, and non-painful.  It is not covered by insurance and is therefore, out of pocket.  The results have been very good with a 80-95% cure rate.  The [facility] is the only practice in the area to offer this therapy. 5. Permanent Nail Avulsion.  Removing the entire nail so that a new nail will not grow back. 

## 2019-07-03 ENCOUNTER — Telehealth: Payer: Medicare HMO

## 2019-07-03 ENCOUNTER — Other Ambulatory Visit: Payer: Self-pay

## 2019-07-03 ENCOUNTER — Ambulatory Visit (INDEPENDENT_AMBULATORY_CARE_PROVIDER_SITE_OTHER): Payer: Medicare HMO

## 2019-07-03 DIAGNOSIS — I10 Essential (primary) hypertension: Secondary | ICD-10-CM | POA: Diagnosis not present

## 2019-07-03 DIAGNOSIS — N183 Chronic kidney disease, stage 3 unspecified: Secondary | ICD-10-CM

## 2019-07-03 DIAGNOSIS — E1122 Type 2 diabetes mellitus with diabetic chronic kidney disease: Secondary | ICD-10-CM | POA: Diagnosis not present

## 2019-07-04 ENCOUNTER — Encounter: Payer: Self-pay | Admitting: Cardiology

## 2019-07-04 NOTE — Progress Notes (Signed)
Remote pacemaker transmission.   

## 2019-07-05 NOTE — Patient Instructions (Signed)
Visit Information  Goals Addressed            This Visit's Progress   . "His balance is much worse and he needs a walker"       Spouse states Current Barriers:  . Impaired gait/balance . High Risk for Falls . 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 . Newly diagnosed; Mild chronic microvascular ischemic changes  Nurse Case Manager Clinical Goal(s):  [name] Over the next 30 days, patient will work with the CCM team to address needs related to impaired gait disturbance and high risk for falls  CCM RN CM Interventions:  07/03/19 call completed with spouse Ms. [name]  . Evaluation of current treatment plan related to impaired physical mobility and gait disturbance and patient's adherence to plan as established by provider . Discussed spouse would like to request a referral for patient to participate in outpatient PT/OT at [facility] where she is currently receiving shoulder PT - she feels  he is more likely to participate and feels it is much needed to help with his balance and strengthening . Provided [name] with the contact # for [facility] in order to set up delivery of Mr. Gubser rollator . Assessed for recent falls or near falls - spouse denies . Sent in basket message to [name] requesting a referral for outpatient PT/OT to [facility] . Discussed plans with patient for ongoing care management follow up and provided patient with direct contact information for care management team  Patient Self Care Activities:  . Currently UNABLE TO independently perform self-care without assistance   Please see past updates related to this goal by clicking on the "Past Updates" button in the selected goal         The patient verbalized understanding of instructions provided today and declined a print copy of patient instruction materials.   Telephone follow up appointment with care management team  member scheduled for: 07/30/19  [name], RN, BSN, CCM Care Management Coordinator [place] [facility]  Direct Phone: [phone]

## 2019-07-05 NOTE — Chronic Care Management (AMB) (Signed)
Chronic Care Management   Follow Up Note   07/03/2019 Name: Dylan [name]. MRN: [id number] DOB: 18-Nov-1940  Referred by: [name], MD Reason for referral : Chronic Care Management (CCM RN CM Telephone Follow up )   Dylan Ina. is a 78 y.o. year old male who is a primary care patient of [name], MD. The CCM team was consulted for assistance with chronic disease management and care coordination needs.    Review of patient status, including review of consultants reports, relevant laboratory and other test results, and collaboration with appropriate care team members and the patient's provider was performed as part of comprehensive patient evaluation and provision of chronic care management services.    SDOH (Social Determinants of Health) screening performed today: Physical Activity. See Care Plan for related entries.   I spoke with patient's spouse Dylan [name] by telephone today.   Outpatient Encounter Medications as of 07/03/2019  Medication Sig Note  . allopurinol (ZYLOPRIM) 100 MG tablet Take 100 mg by mouth daily.    [name] amLODipine (NORVASC) 5 MG tablet TAKE 1 TABLET BY MOUTH EVERY DAY   . Ascorbic Acid (VITAMIN C) 1000 MG tablet Take 1,000 mg by mouth daily.   [name] aspirin 81 MG tablet Take 81 mg by mouth 3 (three) times a week.    . carvedilol (COREG) 12.5 MG tablet Take 1 tablet (12.5 mg total) by mouth 2 (two) times daily.   . Cholecalciferol (VITAMIN D3) 5000 units CAPS Take 5,000 Units by mouth every evening.   . Coenzyme Q10 200 MG capsule Take 200 mg by mouth daily.   [name] COLCRYS 0.6 MG tablet Take 1 tablet (0.6 mg total) by mouth daily. 12/12/2018: Takes as needed for gout flare.   . Dulaglutide (TRULICITY) 1.5 TO/6.7TI SOPN Inject 1.5 mg into the skin once a week.   [name] glucose blood test strip 1 each by Other route as needed for other. Use as instructed   . memantine (NAMENDA) 10 MG tablet TAKE 1 TABLET BY MOUTH TWICE A DAY   . metFORMIN (GLUCOPHAGE) 1000 MG  tablet TAKE 1 TABLET BY MOUTH TWICE A DAY WITH MEALS   . pioglitazone (ACTOS) 45 MG tablet TAKE 1 TABLET BY MOUTH EVERY DAY   . pravastatin (PRAVACHOL) 20 MG tablet TAKE 1 TABLET BY ORAL ROUTE EVERY DAY   . valsartan (DIOVAN) 320 MG tablet TAKE 1 TABLET BY MOUTH EVERY DAY    No facility-administered encounter medications on file as of 07/03/2019.      Goals Addressed    . "His balance is much worse and he needs a walker"       Spouse states Current Barriers:  . Impaired gait/balance . High Risk for Falls . 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 . Newly diagnosed; Mild chronic microvascular ischemic changes  Nurse Case Manager Clinical Goal(s):  [name] Over the next 30 days, patient will work with the CCM team to address needs related to impaired gait disturbance and high risk for falls  CCM RN CM Interventions:  07/03/19 call completed with spouse [name] [name]  . Evaluation of current treatment plan related to impaired physical mobility and gait disturbance and patient's adherence to plan as established by provider . Discussed spouse would like to request a referral for patient to participate in outpatient PT/OT at [facility] where she is currently receiving shoulder PT - she feels  he is more likely to participate and feels  it is much needed to help with his balance and strengthening . Provided [name] with the contact # for [facility] in order to set up delivery of [name] rollator . Assessed for recent falls or near falls - spouse denies . Sent in basket message to [name] requesting a referral for outpatient PT/OT to [facility] . Discussed plans with patient for ongoing care management follow up and provided patient with direct contact information for care management team  Patient Self Care Activities:  . Currently UNABLE TO independently perform self-care without assistance   Please see  past updates related to this goal by clicking on the "Past Updates" button in the selected goal         Telephone follow up appointment with care management team member scheduled for: 07/30/19   [name], RN, BSN, CCM Care Management Coordinator [place] [facility]  Direct Phone: [phone]

## 2019-07-08 NOTE — Progress Notes (Signed)
Subjective: Dylan [name]. is seen today for follow up painful, elongated, thickened toenails 1-5 b/l feet that he cannot cut. Pain interferes with daily activities. Aggravating factor includes wearing enclosed shoe gear and relieved with periodic debridement.  His wife is present during the visit.   Objective:  Neurovascular status unchanged: CFT delayed x 10 digits.  Faintly palpable pedal pulses b/l.  Digital hair absent b/l.  Skin temperature gradient WNL b/l.  Sensation intact 5/5 b/l with 10 gram monofilament.  Vibratory sensation intact b/l.  Dermatological Examination: Skin with normal turgor, texture and tone b/l.  Toenails 1-5 b/l discolored, thick, dystrophic with subungual debris and pain with palpation to nailbeds due to thickness of nails.  Musculoskeletal: Muscle strength 5/5 to all LE muscle groups b/l.  No gross bony deformities b/l.  No pain, crepitus or joint limitation noted with ROM.   Assessment: Painful onychomycosis toenails 1-5 b/l   Plan: 1. Toenails 1-5 b/l were debrided in length and girth without iatrogenic bleeding. 2. Patient to continue soft, supportive shoe gear 3. Patient to report any pedal injuries to medical professional immediately. 4. Follow up 9 weeks. 5. Patient/POA to call should there be a concern in the interim.

## 2019-07-22 ENCOUNTER — Ambulatory Visit: Payer: Medicare HMO | Admitting: Podiatry

## 2019-07-24 ENCOUNTER — Encounter: Payer: Self-pay | Admitting: Internal Medicine

## 2019-07-24 ENCOUNTER — Other Ambulatory Visit: Payer: Self-pay

## 2019-07-24 ENCOUNTER — Ambulatory Visit (INDEPENDENT_AMBULATORY_CARE_PROVIDER_SITE_OTHER): Payer: Medicare HMO | Admitting: Internal Medicine

## 2019-07-24 VITALS — BP 134/74 | HR 80 | Temp 97.8°F | Ht 68.0 in | Wt 198.4 lb

## 2019-07-24 DIAGNOSIS — Z23 Encounter for immunization: Secondary | ICD-10-CM | POA: Diagnosis not present

## 2019-07-24 DIAGNOSIS — I1310 Hypertensive heart and chronic kidney disease without heart failure, with stage 1 through stage 4 chronic kidney disease, or unspecified chronic kidney disease: Secondary | ICD-10-CM

## 2019-07-24 DIAGNOSIS — J8410 Pulmonary fibrosis, unspecified: Secondary | ICD-10-CM | POA: Diagnosis not present

## 2019-07-24 DIAGNOSIS — N183 Chronic kidney disease, stage 3 unspecified: Secondary | ICD-10-CM

## 2019-07-24 DIAGNOSIS — E6609 Other obesity due to excess calories: Secondary | ICD-10-CM

## 2019-07-24 DIAGNOSIS — E1122 Type 2 diabetes mellitus with diabetic chronic kidney disease: Secondary | ICD-10-CM

## 2019-07-24 DIAGNOSIS — Z6830 Body mass index (BMI) 30.0-30.9, adult: Secondary | ICD-10-CM

## 2019-07-24 NOTE — Patient Instructions (Signed)
Exercises To Do While Sitting  Exercises that you do while sitting (chair exercises) can give you many of the same benefits as full exercise. Benefits include strengthening your heart, burning calories, and keeping muscles and joints healthy. Exercise can also improve your mood and help with depression and anxiety. You may benefit from chair exercises if you are unable to do standing exercises because of:  Diabetic foot pain.  Obesity.  Illness.  Arthritis.  Recovery from surgery or injury.  Breathing problems.  Balance problems.  Another type of disability. Before starting chair exercises, check with your health care provider or a physical therapist to find out how much exercise you can tolerate and which exercises are safe for you. If your health care provider approves:  Start out slowly and build up over time. Aim to work up to about 10-20 minutes for each exercise session.  Make exercise part of your daily routine.  Drink water when you exercise. Do not wait until you are thirsty. Drink every 10-15 minutes.  Stop exercising right away if you have pain, nausea, shortness of breath, or dizziness.  If you are exercising in a wheelchair, make sure to lock the wheels.  Ask your health care provider whether you can do tai chi or yoga. Many positions in these mind-body exercises can be modified to do while seated. Warm-up Before starting other exercises: 1. Sit up as straight as you can. Have your knees bent at 90 degrees, which is the shape of the capital letter "L." Keep your feet flat on the floor. 2. Sit at the front edge of your chair, if you can. 3. Pull in (tighten) the muscles in your abdomen and stretch your spine and neck as straight as you can. Hold this position for a few minutes. 4. Breathe in and out evenly. Try to concentrate on your breathing, and relax your mind. Stretching Exercise A: Arm stretch 1. Hold your arms out straight in front of your body. 2. Bend  your hands at the wrist with your fingers pointing up, as if signaling someone to stop. Notice the slight tension in your forearms as you hold the position. 3. Keeping your arms out and your hands bent, rotate your hands outward as far as you can and hold this stretch. Aim to have your thumbs pointing up and your pinkie fingers pointing down. Slowly repeat arm stretches for one minute as tolerated. Exercise B: Leg stretch 1. If you can move your legs, try to "draw" letters on the floor with the toes of your foot. Write your name with one foot. 2. Write your name with the toes of your other foot. Slowly repeat the movements for one minute as tolerated. Exercise C: Reach for the sky 1. Reach your hands as far over your head as you can to stretch your spine. 2. Move your hands and arms as if you are climbing a rope. Slowly repeat the movements for one minute as tolerated. Range of motion exercises Exercise A: Shoulder roll 1. Let your arms hang loosely at your sides. 2. Lift just your shoulders up toward your ears, then let them relax back down. 3. When your shoulders feel loose, rotate your shoulders in backward and forward circles. Do shoulder rolls slowly for one minute as tolerated. Exercise B: March in place 1. As if you are marching, pump your arms and lift your legs up and down. Lift your knees as high as you can. ? If you are unable to lift your knees,  just pump your arms and move your ankles and feet up and down. March in place for one minute as tolerated. Exercise C: Seated jumping jacks 1. Let your arms hang down straight. 2. Keeping your arms straight, lift them up over your head. Aim to point your fingers to the ceiling. 3. While you lift your arms, straighten your legs and slide your heels along the floor to your sides, as wide as you can. 4. As you bring your arms back down to your sides, slide your legs back together. ? If you are unable to use your legs, just move your arms.  Slowly repeat seated jumping jacks for one minute as tolerated. Strengthening exercises Exercise A: Shoulder squeeze 1. Hold your arms straight out from your body to your sides, with your elbows bent and your fists pointed at the ceiling. 2. Keeping your arms in the bent position, move them forward so your elbows and forearms meet in front of your face. 3. Open your arms back out as wide as you can with your elbows still bent, until you feel your shoulder blades squeezing together. Hold for 5 seconds. Slowly repeat the movements forward and backward for one minute as tolerated. Contact a health care provider if you:  Had to stop exercising due to any of the following: ? Pain. ? Nausea. ? Shortness of breath. ? Dizziness. ? Fatigue.  Have significant pain or soreness after exercising. Get help right away if you have:  Chest pain.  Difficulty breathing. These symptoms may represent a serious problem that is an emergency. Do not wait to see if the symptoms will go away. Get medical help right away. Call your local emergency services (911 in the [place] Do not drive yourself to the hospital. This information is not intended to replace advice given to you by your health care provider. Make sure you discuss any questions you have with your health care provider. Document Released: 08/30/2017 Document Revised: 02/07/2019 Document Reviewed: 08/30/2017 Elsevier Patient Education  2020 [employer].

## 2019-07-25 ENCOUNTER — Telehealth: Payer: Self-pay

## 2019-07-25 LAB — CMP14+EGFR
ALT: 25 IU/L (ref 0–44)
AST: 28 IU/L (ref 0–40)
Albumin/Globulin Ratio: 1.8 (ref 1.2–2.2)
Albumin: 4.2 g/dL (ref 3.7–4.7)
Alkaline Phosphatase: 149 IU/L — ABNORMAL HIGH (ref 39–117)
BUN/Creatinine Ratio: 19 (ref 10–24)
BUN: 30 mg/dL — ABNORMAL HIGH (ref 8–27)
Bilirubin Total: 0.3 mg/dL (ref 0.0–1.2)
CO2: 23 mmol/L (ref 20–29)
Calcium: 9.4 mg/dL (ref 8.6–10.2)
Chloride: 105 mmol/L (ref 96–106)
Creatinine, Ser: 1.6 mg/dL — ABNORMAL HIGH (ref 0.76–1.27)
GFR calc Af Amer: 47 mL/min/{1.73_m2} — ABNORMAL LOW (ref >59)
GFR calc non Af Amer: 41 mL/min/{1.73_m2} — ABNORMAL LOW (ref >59)
Globulin, Total: 2.4 g/dL (ref 1.5–4.5)
Glucose: 246 mg/dL — ABNORMAL HIGH (ref 65–99)
Potassium: 5.1 mmol/L (ref 3.5–5.2)
Sodium: 139 mmol/L (ref 134–144)
Total Protein: 6.6 g/dL (ref 6.0–8.5)

## 2019-07-25 LAB — LIPID PANEL
Chol/HDL Ratio: 2.6 ratio (ref 0.0–5.0)
Cholesterol, Total: 129 mg/dL (ref 100–199)
HDL: 49 mg/dL (ref >39)
LDL Chol Calc (NIH): 66 mg/dL (ref 0–99)
Triglycerides: 70 mg/dL (ref 0–149)
VLDL Cholesterol Cal: 14 mg/dL (ref 5–40)

## 2019-07-25 LAB — HEMOGLOBIN A1C
Est. average glucose Bld gHb Est-mCnc: 192 mg/dL
Hgb A1c MFr Bld: 8.3 % — ABNORMAL HIGH (ref 4.8–5.6)

## 2019-07-25 NOTE — Telephone Encounter (Signed)
1st attempt to give lab result  

## 2019-07-25 NOTE — Telephone Encounter (Signed)
-----   Message from [name], MD sent at 07/25/2019 12:31 PM EDT ----- Please speak to pt and his wife at the same time.  Your chol is great.  Continue with current meds. Your kidney fxn has decreased. Be sure to stay well hydrated. Your hba1c has gotten worse. If there is no improvement by Dec, we will need to start insulin. Stop actos/pioglitazone. Also, cut metformin back to once daily. You will need to move more to be sure your sugars do not go up. [name] - please do not buy donuts, ice cream, etc. If it is not in the house, it can't be eaten. I want to see him in Nov for DM check. Please schedule appt if not already scheduled.

## 2019-07-27 NOTE — Progress Notes (Signed)
Subjective:     Patient ID: Dylan [name]. , male    DOB: [birth year] , 78 y.o.   MRN: [id number]   Chief Complaint  Patient presents with  . Diabetes  . Hypertension    HPI  Diabetes He presents for his follow-up diabetic visit. He has type 2 diabetes mellitus. There are no hypoglycemic associated symptoms. Pertinent negatives for diabetes include no blurred vision. There are no hypoglycemic complications. Diabetic complications include nephropathy. Risk factors for coronary artery disease include diabetes mellitus, dyslipidemia, hypertension, sedentary lifestyle, male sex and obesity. He is compliant with treatment some of the time. He is following a diabetic diet. He participates in exercise intermittently. His breakfast blood glucose is taken between 9-10 am. His breakfast blood glucose range is generally 140-180 mg/dl. An ACE inhibitor/angiotensin II receptor blocker is being taken. He does not see a podiatrist.Eye exam is current.  Hypertension This is a chronic problem. The current episode started more than 1 year ago. The problem has been gradually improving since onset. The problem is controlled. Pertinent negatives include no blurred vision. Risk factors for coronary artery disease include diabetes mellitus, dyslipidemia, male gender, obesity and sedentary lifestyle. There is no history of sleep apnea.     Past Medical History:  Diagnosis Date  . Anxiety   . Aortic stenosis    mild AS 11/24/16 (peak grad 24, mean grad 11) Dr. [name]  . CHB (complete heart block) ([place]) 07/2017  . 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 Diabetes mellitus without complication ([place])   . Gout   . Hyperlipemia   . Hypertension   . 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 Memory loss   . Peripheral arterial disease ([place])   . 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 Second degree AV block    Wenckebach; no indication for pacemaker as of 12/01/16 (Dr. [name])   . Vitamin B12 deficiency anemia 03/01/2018  . Vitamin D deficiency disease      Family History  Problem Relation Age of Onset  . Diabetes Mother   . Breast cancer Mother   . Arthritis Mother   . Hypertension Father   . Diabetes Sister   . Diabetes Brother   . Diabetes Maternal Grandmother   . Diabetes Brother   . Diabetes Brother   . Diabetes Brother   . Diabetes Sister   . Diabetes Sister   . Diabetes Sister      Current Outpatient Medications:  .  allopurinol (ZYLOPRIM) 100 MG tablet, Take 100 mg by mouth daily. , Disp: , Rfl:  .  amLODipine (NORVASC) 5 MG tablet, TAKE 1 TABLET BY MOUTH EVERY DAY, Disp: 90 tablet, Rfl: 1 .  Ascorbic Acid (VITAMIN C) 1000 MG tablet, Take 1,000 mg by mouth daily., Disp: , Rfl:  .  aspirin 81 MG tablet, Take 81 mg by mouth 3 (three) times a week. , Disp: , Rfl:  .  carvedilol (COREG) 12.5 MG tablet, Take 1 tablet (12.5 mg total) by mouth 2 (two) times daily., Disp: 180 tablet, Rfl: 3 .  Cholecalciferol (VITAMIN D3) 5000 units CAPS, Take 5,000 Units by mouth every evening., Disp: , Rfl:  .  Coenzyme Q10 200 MG capsule, Take 200 mg by mouth daily., Disp: , Rfl:  .  COLCRYS 0.6 MG tablet, Take 1 tablet (0.6 mg total) by mouth daily., Disp: 90 tablet, Rfl: 1 .  Dulaglutide (TRULICITY) 1.5 FF/6.3WG SOPN, Inject 1.5 mg into the skin once a  week., Disp: 4 pen, Rfl: 1 .  glucose blood test strip, 1 each by Other route as needed for other. Use as instructed, Disp: , Rfl:  .  memantine (NAMENDA) 10 MG tablet, TAKE 1 TABLET BY MOUTH TWICE A DAY, Disp: 180 tablet, Rfl: 4 .  metFORMIN (GLUCOPHAGE) 1000 MG tablet, TAKE 1 TABLET BY MOUTH TWICE A DAY WITH MEALS, Disp: 60 tablet, Rfl: 2 .  pioglitazone (ACTOS) 45 MG tablet, TAKE 1 TABLET BY MOUTH EVERY DAY, Disp: 90 tablet, Rfl: 2 .  pravastatin (PRAVACHOL) 20 MG tablet, TAKE 1 TABLET BY ORAL ROUTE EVERY DAY, Disp: 30 tablet, Rfl: 2 .  valsartan (DIOVAN) 320 MG tablet, TAKE 1 TABLET BY MOUTH EVERY DAY, Disp: 90  tablet, Rfl: 1   No Known Allergies   Review of Systems  Constitutional: Negative.   Eyes: Negative for blurred vision.  Respiratory: Negative.   Cardiovascular: Negative.   Gastrointestinal: Negative.   Neurological: Negative.   Psychiatric/Behavioral: Negative.      Today's Vitals   07/24/19 1139  BP: 134/74  Pulse: 80  Temp: 97.8 F (36.6 C)  TempSrc: Oral  SpO2: 97%  Weight: 198 lb 6.4 oz (90 kg)  Height: '5\' 8"'  (1.727 m)   Body mass index is 30.17 kg/m.   Objective:  Physical Exam Vitals signs and nursing note reviewed.  Constitutional:      Appearance: Normal appearance.  Cardiovascular:     Rate and Rhythm: Normal rate and regular rhythm.     Heart sounds: Normal heart sounds.  Pulmonary:     Effort: Pulmonary effort is normal.     Breath sounds: Normal breath sounds.  Skin:    General: Skin is warm.  Neurological:     General: No focal deficit present.     Mental Status: He is alert.  Psychiatric:        Mood and Affect: Mood normal.         Assessment And Plan:     1. Type 2 diabetes mellitus with stage 3 chronic kidney disease, without long-term current use of insulin ([place])  I will check labs as listed below.  Importance of dietary and medication compliance was discussed with the patient.   - Lipid panel - CMP14+EGFR - Hemoglobin A1c  2. Hypertensive heart and renal disease with renal failure, stage 1 through stage 4 or unspecified chronic kidney disease, without heart failure  Fair control. He will continue with current meds. He is encouraged to avoid adding salt to his foods. He is also encouraged to stay well hydrated.   3. Flu vaccine need  He was given high dose flu vaccine.   4. Lung granuloma (HCC)  Chronic, yet stable.    5. Class 1 obesity due to excess calories with serious comorbidity and body mass index (BMI) of 30.0 to 30.9 in adult  He is encouraged to strive for BMI less than 27 to decrease cardiac risk. He is  encouraged to perform chair exercises regularly. He is also encouraged to cut back on his intake of concentrated sweets.   Maximino Greenland, MD    THE PATIENT IS ENCOURAGED TO PRACTICE SOCIAL DISTANCING DUE TO THE COVID-19 PANDEMIC.

## 2019-07-30 ENCOUNTER — Telehealth: Payer: Medicare HMO

## 2019-08-01 ENCOUNTER — Other Ambulatory Visit: Payer: Self-pay

## 2019-08-01 MED ORDER — METFORMIN HCL 1000 MG PO TABS: ORAL_TABLET | ORAL | 0 refills | Status: DC

## 2019-08-01 MED ORDER — CYCLOBENZAPRINE HCL 5 MG PO TABS: 5.0000 mg | ORAL_TABLET | Freq: Three times a day (TID) | ORAL | 0 refills | Status: DC | PRN

## 2019-08-01 MED ORDER — CELECOXIB 200 MG PO CAPS: 200.0000 mg | ORAL_CAPSULE | Freq: Every day | ORAL | 2 refills | Status: DC

## 2019-08-01 MED ORDER — METHOCARBAMOL 500 MG PO TABS: 500.0000 mg | ORAL_TABLET | Freq: Four times a day (QID) | ORAL | Status: DC

## 2019-08-01 NOTE — Progress Notes (Signed)
This encounter was created in error - please disregard.

## 2019-08-02 ENCOUNTER — Ambulatory Visit (INDEPENDENT_AMBULATORY_CARE_PROVIDER_SITE_OTHER): Payer: Medicare HMO

## 2019-08-02 ENCOUNTER — Ambulatory Visit: Payer: Self-pay | Admitting: Pharmacist

## 2019-08-02 DIAGNOSIS — I10 Essential (primary) hypertension: Secondary | ICD-10-CM

## 2019-08-02 DIAGNOSIS — N1831 Chronic kidney disease, stage 3a: Secondary | ICD-10-CM

## 2019-08-02 DIAGNOSIS — E1122 Type 2 diabetes mellitus with diabetic chronic kidney disease: Secondary | ICD-10-CM

## 2019-08-02 DIAGNOSIS — R413 Other amnesia: Secondary | ICD-10-CM

## 2019-08-02 DIAGNOSIS — N182 Chronic kidney disease, stage 2 (mild): Secondary | ICD-10-CM | POA: Diagnosis not present

## 2019-08-02 DIAGNOSIS — I1310 Hypertensive heart and chronic kidney disease without heart failure, with stage 1 through stage 4 chronic kidney disease, or unspecified chronic kidney disease: Secondary | ICD-10-CM

## 2019-08-02 NOTE — Progress Notes (Signed)
  Chronic Care Management   Visit Note  08/02/2019 Name: Dylan [name]. MRN: [id number] DOB: [birth year]  Referred by: [name], MD Reason for referral : Chronic Care Management   Dylan [name]. is a 78 y.o. year old male who is a primary care patient of [name], MD. The CCM team was consulted for assistance with chronic disease management and care coordination needs.   Review of patient status, including review of consultants reports, relevant laboratory and other test results, and collaboration with appropriate care team members and the patient's provider was performed as part of comprehensive patient evaluation and provision of chronic care management services.    I spoke with patient's caregiver, Dylan [name], by telephone today.  Face to face appointment scheduled for Monday (10.5.2020) to review medication list discrepancies and pill packaging.  Advanced Directives Status: N See Care Plan and [facility] application for related entries.   Medications: Outpatient Encounter Medications as of 08/02/2019  Medication Sig Note  . allopurinol (ZYLOPRIM) 100 MG tablet Take 100 mg by mouth daily.    [name] amLODipine (NORVASC) 5 MG tablet TAKE 1 TABLET BY MOUTH EVERY DAY   . Ascorbic Acid (VITAMIN C) 1000 MG tablet Take 1,000 mg by mouth daily.   [name] aspirin 81 MG tablet Take 81 mg by mouth 3 (three) times a week.    . carvedilol (COREG) 12.5 MG tablet Take 1 tablet (12.5 mg total) by mouth 2 (two) times daily.   . celecoxib (CELEBREX) 200 MG capsule Take 1 capsule (200 mg total) by mouth daily. (Patient not taking: Reported on 08/02/2019)   . Cholecalciferol (VITAMIN D3) 5000 units CAPS Take 5,000 Units by mouth every evening.   . Coenzyme Q10 200 MG capsule Take 200 mg by mouth daily.   [name] COLCRYS 0.6 MG tablet Take 1 tablet (0.6 mg total) by mouth daily. 12/12/2018: Takes as needed for gout flare.   . cyclobenzaprine (FLEXERIL) 5 MG tablet Take 1 tablet (5 mg total) by mouth 3 (three) times  daily as needed for muscle spasms.   . Dulaglutide (TRULICITY) 1.5 JI/9.6VE SOPN Inject 1.5 mg into the skin once a week.   [name] glucose blood test strip 1 each by Other route as needed for other. Use as instructed   . memantine (NAMENDA) 10 MG tablet TAKE 1 TABLET BY MOUTH TWICE A DAY   . metFORMIN (GLUCOPHAGE) 1000 MG tablet Take one tablet by mouth daily.   . methocarbamol (ROBAXIN) 500 MG tablet Take 1 tablet (500 mg total) by mouth 4 (four) times daily.   . pravastatin (PRAVACHOL) 20 MG tablet TAKE 1 TABLET BY ORAL ROUTE EVERY DAY   . valsartan (DIOVAN) 320 MG tablet TAKE 1 TABLET BY MOUTH EVERY DAY    No facility-administered encounter medications on file as of 08/02/2019.        Plan:   Face to Face appointment with care management team member scheduled for: 08/05/19   Regina Eck, PharmD, BCPS Clinical Pharmacist, [facility] [place]: [phone]

## 2019-08-05 ENCOUNTER — Ambulatory Visit: Payer: Medicare HMO | Admitting: Pharmacist

## 2019-08-05 DIAGNOSIS — R413 Other amnesia: Secondary | ICD-10-CM

## 2019-08-05 DIAGNOSIS — E1121 Type 2 diabetes mellitus with diabetic nephropathy: Secondary | ICD-10-CM | POA: Diagnosis not present

## 2019-08-05 DIAGNOSIS — N1831 Chronic kidney disease, stage 3a: Secondary | ICD-10-CM

## 2019-08-05 DIAGNOSIS — I10 Essential (primary) hypertension: Secondary | ICD-10-CM | POA: Diagnosis not present

## 2019-08-05 NOTE — Patient Instructions (Signed)
Visit Information  Goals Addressed    . "He's eating too many sweets"       Spouse stated Current Barriers:  [name] Knowledge Deficits related to diabetes Meal planning using the plate method and portion control  Nurse Case Manager Clinical Goal(s):  [name] Over the next 90 days, patient will work with the CCM team to address needs related to diabetes Self Health management specifically related to nutritional recommendations  CCM RN CM Interventions:  08/02/19 call completed with spouse [name]   . Evaluation of current treatment plan related to diabetes and patient's adherence to plan as established by provider. . Provided education to patient re: importance of diabetic dietary recommendations including adherence to following a low carb diet using Meal Planning with the plate method and portion control; discussed patient's recent A1C increased to 8.3; education provided related to potential health risks for uncontrolled diabetes  . Reviewed medications with patient and discussed patient is adhering to taking his weekly injection of Trulicity and patient is self injecting; discussed patient is also adhering to taking his Metformin 1000 mg once daily as directed  . Discussed plans with patient for ongoing care management follow up and provided patient with direct contact information for care management team . Provided patient with printed educational materials related to Diabetes Meal Planning; Carb Counting; Carb Choices; Meal Planner; Manage Your Diabetes . Advised patient, providing education and rationale, to check cbg daily before meals and record, calling the CCM team and or PCP for findings outside established parameters.  <80 and or >250  Patient Self Care Activities:  . Unable to independently perform self care  Initial goal documentation     . "His balance is much worse and he needs a walker"   On track    Spouse states Current Barriers:  . Impaired gait/balance . High Risk for  Falls . 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 . Newly diagnosed; Mild chronic microvascular ischemic changes  Nurse Case Manager Clinical Goal(s):  [name] Over the next 30 days, patient will work with the CCM team to address needs related to impaired gait disturbance and high risk for falls  Goal Met  . 08/02/19 Over the next 30 days, patient will complete outpatient PT and will be able to adhere to his prescribed HEP   CCM RN CM Interventions:  08/02/19 call completed with spouse Ms. [name]  . Evaluation of current treatment plan related to impaired physical mobility and gait disturbance and patient's adherence to plan as established by provider . Discussed Mr. Degen is attending outpatient PT with [place] twice weekly with his spouse; discussed he is using his rollator and spouse reports Mr. Umanzor has had no further falls - discussed she is satisfied with his progress and feels the PT is helping him  . Encouraged [name] to notify the CCM team and or PCP of any/all falls promptly  . Discussed patient's spouse inquired about initiating an emergency alert system in the home for Mr. Blanchfield and plans to pick up the equipment next week when advised to do so . Discussed plans with patient for ongoing care management follow up and provided patient with direct contact information for care management team  Patient Self Care Activities:  . Currently UNABLE TO independently perform self-care without assistance   Please see past updates related to this goal by clicking on the "Past Updates" button in the selected goal      . "  I don't know what the next steps are for Mr. Villarin Dementia"       Spouse stated  Current Barriers:  [name] Knowledge Deficits related to next steps for diagnosis and treatment for new diagnosis of Dementia  Nurse Case Manager Clinical Goal(s):  [name] Over the next 14 days, spouse will  collaborative with the Neuro office and receive instructions regarding brain imaging ordered for determination of type of Dementia - Goal Met  . Over the next 90 days, patient will verbalize understanding of plan for treatment management for new dx: Dementia  CCM RN CM Interventions:  08/02/19: call completed with spouse [name]  . 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 . Reviewed medications with patient and discussed spouse reports patient has been less forgetful and his personality and mood have been more consistent and stable since starting Namenda . Discussed spouse will notify Dr. Krista Blue of new or worsening s/s of dementia, mood or personality change . Confirmed [name] has the contact # for Dr. Krista Blue; discussed next OV scheduled for 11/18 @ 3:15 PM  . Discussed plans with patient for ongoing care management follow up and provided patient with direct contact information for care management team   06/10/19 Reviewed brain CT image results from 06/07/19 as ordered by Neurologist, Dr. Krista Blue  Noted the following IMPRESSION:  This CT scan of the head without contrast shows the following: 1.   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 2.   Mild chronic microvascular ischemic changes. 3.   There are no acute findings.  Results not discussed with spouse today. Per [name] she will call to schedule a follow up with the Dr. Krista Blue to review the CT results.   Patient Self Care Activities with assistance/supervision from spouse . Self administers medications as prescribed . Attends all scheduled provider appointments . Performs ADL's independently . Performs IADL's independently  Please see past updates related to this goal by clicking on the "Past Updates" button in the selected goal      . I would like to continue to optimize his medication management of my chronic  conditions.       Wife stated Current Barriers:  . Polypharmacy; complex patient with multiple comorbidities including dementia, diabetes, hypertension . Wife assists with managing medications via [employer] pill packaging system.  Patient continues to take pills out and sometimes does not take them.   Pharmacist Clinical Goal(s):  [name] Over the next 90 days, patient will work with PharmD and provider towards optimized medication management  Interventions: . Comprehensive medication review performed; medication list updated in electronic medical record . Counseled wife on working to oversee pill pack administration as patient takes pills out at times and forgets to take them.  She continues to give patient the Namenda twice daily.  She has not seen a great benefit from this. . Reviewed & discussed the following diabetes-related information with patient: o Continue checking blood sugars as directed o Follow ADA recommended "diabetes-friendly" diet  (reviewed healthy snack/food options) o Confirmed current DM regimen: Metformin, pioglitazone, Trulicity weekly o Discussed GLP-1 injection technique o Reviewed medication purpose/side effects-->patient denies adverse events, denies hypoglycemia, reports FBG<180, most recently in the upper 150s. o Most recent A1c is 8.2% on 04/23/19 (increase from 7.5% on 01/21/19).  Wife working to given medications as prescribed, but receives resistance from patient as he is battling with dementia o Current anti-hypertensive  regimen: carvedilol, amlodipine, valsartan o Current anti-hyperlipidemia regimen: pravastatin 65m daily o Continue taking all medications as prescribed by provider  CCM RN CM Interventions: 08/02/19 call completed with spouse GStaci Righter  . Discussed Ms. GArsenio Loaderis concerned that she is not aware of recent medication changes made for Mr. JLubrano. Reviewed patient's current and most updated medication list; discussed patient's Coreg is not being  packaged in his bubble packs, however, the patient is taking this medication as prescribed . Discussed Ms. GArsenio Loaderwill pick up a copy [facility] and [facility] to his home meds including his bubble packs . Collaboration with embedded Pharm D JLottie Dawsonregarding Ms. Gilliam's concerns; discussed Ms. GArsenio Loaderwill have a [facility]  Patient Self Care Activities:  . Patient will take medications as prescribed with help from wife and pill packaging system . Patient will focus on improved adherence by allowing wife to assist with pill pack administration .   Please see past updates related to this goal by clicking on the "Past Updates" button in the selected goal         The patient verbalized understanding of instructions provided today and declined a print copy of patient instruction materials.   Telephone follow up appointment with care management team member scheduled for: 08/23/19  ABarb Merino RN, BSN, CCM Care Management Coordinator [facility]  Direct Phone: [phone]

## 2019-08-05 NOTE — Chronic Care Management (AMB) (Signed)
Chronic Care Management   Follow Up Note   08/02/2019 Name: Dylan [name]. MRN: [id number] DOB: Mar 12, 1941  Referred by: Dylan Chard, MD Reason for referral : Chronic Care Management (CCM RNCM Telephone Follow up)   Dylan Ina. is a 78 y.o. year old male who is a primary care patient of Dylan Chard, MD. The CCM team was consulted for assistance with chronic disease management and care coordination needs.    Review of patient status, including review of consultants reports, relevant laboratory and other test results, and collaboration with appropriate care team members and the patient's provider was performed as part of comprehensive patient evaluation and provision of chronic care management services.    SDOH (Social Determinants of Health) screening performed today: None. See Care Plan for related entries.   Advanced Directives Status: N See Care Plan and Vynca application for related entries.  I spoke with patient's spouse and caregiver [name] [name] by telephone today for a CCM follow up.   Outpatient Encounter Medications as of 08/02/2019  Medication Sig Note   allopurinol (ZYLOPRIM) 100 MG tablet Take 100 mg by mouth daily.     amLODipine (NORVASC) 5 MG tablet TAKE 1 TABLET BY MOUTH EVERY DAY    Ascorbic Acid (VITAMIN C) 1000 MG tablet Take 1,000 mg by mouth daily.    carvedilol (COREG) 12.5 MG tablet Take 1 tablet (12.5 mg total) by mouth 2 (two) times daily.    Cholecalciferol (VITAMIN D3) 5000 units CAPS Take 5,000 Units by mouth every evening.    Coenzyme Q10 200 MG capsule Take 200 mg by mouth daily.    COLCRYS 0.6 MG tablet Take 1 tablet (0.6 mg total) by mouth daily. (Patient taking differently: Take 0.6 mg by mouth as needed. ) 12/12/2018: Takes as needed for gout flare.    Dulaglutide (TRULICITY) 1.5 EQ/6.8TM SOPN Inject 1.5 mg into the skin once a week.    glucose blood test strip 1 each by Other route as needed for other. Use as instructed      memantine (NAMENDA) 10 MG tablet TAKE 1 TABLET BY MOUTH TWICE A DAY    metFORMIN (GLUCOPHAGE) 1000 MG tablet Take one tablet by mouth daily.    pravastatin (PRAVACHOL) 20 MG tablet TAKE 1 TABLET BY ORAL ROUTE EVERY DAY    valsartan (DIOVAN) 320 MG tablet TAKE 1 TABLET BY MOUTH EVERY DAY    [DISCONTINUED] cyclobenzaprine (FLEXERIL) 5 MG tablet Take 1 tablet (5 mg total) by mouth 3 (three) times daily as needed for muscle spasms.    [DISCONTINUED] methocarbamol (ROBAXIN) 500 MG tablet Take 1 tablet (500 mg total) by mouth 4 (four) times daily.    aspirin 81 MG tablet Take 81 mg by mouth 3 (three) times a week.     [DISCONTINUED] celecoxib (CELEBREX) 200 MG capsule Take 1 capsule (200 mg total) by mouth daily. (Patient not taking: Reported on 08/02/2019)    No facility-administered encounter medications on file as of 08/02/2019.      Goals Addressed     "He's eating too many sweets"       Spouse stated Current Barriers:   Knowledge Deficits related to diabetes Meal planning using the plate method and portion control  Nurse Case Manager Clinical Goal(s):   Over the next 90 days, patient will work with the CCM team to address needs related to diabetes Self Health management specifically related to nutritional recommendations  CCM RN CM Interventions:  08/02/19 call completed with spouse Dylan [name]  Evaluation of current treatment plan related to diabetes and patient's adherence to plan as established by provider.  Provided education to patient re: importance of diabetic dietary recommendations including adherence to following a low carb diet using Meal Planning with the plate method and portion control; discussed patient's recent A1C increased to 8.3; education provided related to potential health risks for uncontrolled diabetes   Reviewed medications with patient and discussed patient is adhering to taking his weekly injection of Trulicity and patient is self injecting;  discussed patient is also adhering to taking his Metformin 1000 mg once daily as directed   Discussed plans with patient for ongoing care management follow up and provided patient with direct contact information for care management team  Provided patient with printed educational materials related to Diabetes Meal Planning; Carb Counting; Carb Choices; Meal Planner; Manage Your Diabetes  Advised patient, providing education and rationale, to check cbg daily before meals and record, calling the CCM team and or PCP for findings outside established parameters.  <80 and or >250  Patient Self Care Activities:   Unable to independently perform self care  Initial goal documentation      "His balance is much worse and he needs a walker"   On track    Spouse states Current Barriers:   Impaired gait/balance  High Risk for Falls  Newly diagnosed; Mild cortical atrophy mostly in the mesial temporal lobes and parietal lobes.  Newly diagnosed; Mild chronic microvascular ischemic changes  Nurse Case Manager Clinical Goal(s):   Over the next 30 days, patient will work with the CCM team to address needs related to impaired gait disturbance and high risk for falls  Goal Met   08/02/19 Over the next 30 days, patient will complete outpatient PT and will be able to adhere to his prescribed HEP   CCM RN CM Interventions:  08/02/19 call completed with spouse [name] [name]   Evaluation of current treatment plan related to impaired physical mobility and gait disturbance and patient's adherence to plan as established by provider  Discussed Mr. Rorke is attending outpatient PT with [place] [facility] twice weekly with his spouse; discussed he is using his rollator and spouse reports Mr. Yoho has had no further falls - discussed she is satisfied with his progress and feels the PT is helping him   Encouraged [name] to notify the CCM team and or PCP of any/all falls promptly    Discussed patient's spouse inquired about initiating an emergency alert system in the home for Mr. Zielinski and plans to pick up the equipment next week when advised to do so  Discussed plans with patient for ongoing care management follow up and provided patient with direct contact information for care management team  Patient Self Care Activities:   Currently UNABLE TO independently perform self-care without assistance   Please see past updates related to this goal by clicking on the "Past Updates" button in the selected goal       "I don't know what the next steps are for Mr. 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  Reviewed medications with patient and discussed spouse reports patient has been less forgetful and his personality and mood have been more consistent and stable since starting Namenda  Discussed spouse will notify Dr. Krista Blue of new or worsening s/s of dementia, mood or personality change  Confirmed [name] has the contact # for Dr. Krista Blue; discussed next OV scheduled for 11/18 @ 3:15 PM   Discussed plans with patient for ongoing care management follow up and provided patient with direct contact information for care management team   06/10/19 Reviewed brain CT image results from 06/07/19 as ordered by Neurologist, Dr.  Krista Blue  Noted the following IMPRESSION:  This CT scan of the head without contrast shows the following: 1.   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 2.   Mild chronic microvascular ischemic changes. 3.   There are no acute findings.  Results not discussed with spouse today. Per [name] she will call to schedule a follow up with the Dr. Krista Blue to review the CT results.   Patient Self Care Activities with assistance/supervision from spouse  Self administers medications as prescribed  Attends all scheduled provider appointments  Performs ADL's independently  Performs IADL's independently  Please see past updates related to this goal by clicking on the "Past Updates" button in the selected goal       I would like to continue to optimize his medication management of my chronic conditions.       Wife stated Current Barriers:   Polypharmacy; complex patient with multiple comorbidities including dementia, diabetes, hypertension  Wife assists with managing medications via [employer] pill packaging system.  Patient continues to take pills out and sometimes does not take them.   Pharmacist Clinical Goal(s):   Over the next 90 days, patient will work with PharmD and provider towards optimized medication management  Interventions:  Comprehensive medication review performed; medication list updated in electronic medical record  Counseled wife on working to oversee pill pack administration as patient takes pills out at times and forgets to take them.  She continues to give patient the Namenda twice daily.  She has not seen a great benefit from this.  Reviewed & discussed the following diabetes-related information with patient: o Continue checking blood sugars as directed o Follow ADA recommended "diabetes-friendly" diet  (reviewed healthy snack/food options) o Confirmed current DM regimen: Metformin, pioglitazone, Trulicity weekly o Discussed GLP-1  injection technique o Reviewed medication purpose/side effects-->patient denies adverse events, denies hypoglycemia, reports FBG<180, most recently in the upper 150s. o Most recent A1c is 8.2% on 04/23/19 (increase from 7.5% on 01/21/19).  Wife working to given medications as prescribed, but receives resistance from patient as he is battling with dementia o Current anti-hypertensive regimen: carvedilol, amlodipine, valsartan o Current anti-hyperlipidemia regimen: pravastatin 27m daily o Continue taking all medications as prescribed by provider  CCM RN CM Interventions: 08/02/19 call completed with spouse [name]   Discussed Ms. GArsenio Loaderis concerned that she is not aware of recent medication changes made for Mr. JGroeneveld Reviewed patient's current and most updated medication list; discussed patient's Coreg is not being packaged in his bubble packs, however, the patient is taking this medication as prescribed  Discussed Ms. GArsenio Loaderwill pick  up a copy of Mr. Malveaux medication list from the [facility] office and compare to his home meds including his bubble packs  Collaboration with embedded Pharm D Lottie Dawson regarding Ms. Gilliam's concerns; discussed [name] will have a face to face encounter with Rhea Bleacher D at the [facility] office on Monday 08/05/19 for medication reconciliation  Patient Self Care Activities:   Patient will take medications as prescribed with help from wife and pill packaging system  Patient will focus on improved adherence by allowing wife to assist with pill pack administration    Please see past updates related to this goal by clicking on the "Past Updates" button in the selected goal          Telephone follow up appointment with care management team member scheduled for: 08/23/19   [name], RN, BSN, CCM Care Management Coordinator [place] [facility]  Direct Phone: [phone]

## 2019-08-06 NOTE — Patient Instructions (Signed)
Visit Information  Goals Addressed            This Visit's Progress   . I would like to continue to optimize his medication management of my chronic conditions.       Wife stated Current Barriers:  . Polypharmacy; complex patient with multiple comorbidities including dementia, diabetes, hypertension . Wife assists with managing medications via [facility] pill packaging system.  Patient continues to take pills out and sometimes does not take them.  Pharmacist Clinical Goal(s):  [name] Over the next 90 days, patient will work with PharmD and provider towards optimized medication management  Interventions: 08/05/19 Met with caregiver in clinic to review medication changes . Comprehensive medication review performed; medication list updated in electronic medical record . Counseled wife on working to oversee pill pack administration as patient takes pills out at times and forgets to take them.  She continues to give patient the Namenda twice daily.  She has not seen a great benefit from this. . Reviewed & discussed the following diabetes-related information with patient: o Continue checking blood sugars as directed o Follow ADA recommended "diabetes-friendly" diet  (reviewed healthy snack/food options); Patient is eating large amounts of desserts.  Wife states she is trying to limit, however patient is headstrong at times. o Confirmed current DM regimen: Metformin once daily, Trulicity weekly (actos d/c'd due to edema/CKD) o Discussed GLP-1 injection technique.  Patient is self-administering o Reviewed medication purpose/side effects-->patient denies adverse events, denies hypoglycemia, r o Most recent A1c is 8.2% on 04/23/19 (increase from 7.5% on 01/21/19).  Wife working to give medications as prescribed, but receives resistance from patient as he is battling with dementia.   o Current anti-hypertensive regimen: carvedilol, amlodipine, valsartan o Current anti-hyperlipidemia regimen: pravastatin 23m  daily o Continue taking all medications as prescribed by provider o Mail order is currently missing some of his prescriptions.  I will have these called in.  CCM RN CM Interventions: 08/02/19 call completed with spouse GStaci Righter  . Discussed Ms. GArsenio Loaderis concerned that she is not aware of recent medication changes made for Mr. JPucillo. Reviewed patient's current and most updated medication list; discussed patient's Coreg is not being packaged in his bubble packs, however, the patient is taking this medication as prescribed . Discussed Ms. GArsenio Loaderwill pick up a copy of [facility] and [facility] to his home meds including his bubble packs . Collaboration with embedded Pharm D JLottie Dawsonregarding Ms. Gilliam's concerns; discussed Ms. GArsenio Loaderwill have a [facility]  Patient Self Care Activities:  . Patient will take medications as prescribed with help from wife and pill packaging system . Patient will focus on improved adherence by allowing wife to assist with pill pack administration   Please see past updates related to this goal by clicking on the "Past Updates" button in the selected goal         The patient verbalized understanding of instructions provided today and declined a print copy of patient instruction materials.   The care management team will reach out to the patient again over the next 4-6 weeks.  JRegina Eck PharmD, BCPS Clinical Pharmacist, [facility] [phone]

## 2019-08-06 NOTE — Progress Notes (Signed)
Chronic Care Management   Visit Note  08/05/2019 Name: Dylan [name]. MRN: [id number] DOB: 30-Sep-1941  Referred by: Dylan Chard, MD Reason for referral : Chronic Care Management   Dylan [name]. is a 78 y.o. year old male who is a primary care patient of Dylan Chard, MD. 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    I met with caregiver, Dylan [name], in clinic today.  Advanced Directives Status: N See Care Plan and Vynca application for related entries.   Medications: Outpatient Encounter Medications as of 08/05/2019  Medication Sig Note  . cyclobenzaprine (FLEXERIL) 5 MG tablet Take 5-10 mg by mouth at bedtime as needed for muscle spasms.   . methocarbamol (ROBAXIN) 500 MG tablet Take 500 mg by mouth daily as needed for muscle spasms.   [name] allopurinol (ZYLOPRIM) 100 MG tablet Take 100 mg by mouth daily.    [name] amLODipine (NORVASC) 5 MG tablet TAKE 1 TABLET BY MOUTH EVERY DAY   . Ascorbic Acid (VITAMIN C) 1000 MG tablet Take 1,000 mg by mouth daily.   [name] aspirin 81 MG tablet Take 81 mg by mouth 3 (three) times a week.    . carvedilol (COREG) 12.5 MG tablet Take 1 tablet (12.5 mg total) by mouth 2 (two) times daily.   . Cholecalciferol (VITAMIN D3) 5000 units CAPS Take 5,000 Units by mouth every evening.   . Coenzyme Q10 200 MG capsule Take 200 mg by mouth daily.   [name] COLCRYS 0.6 MG tablet Take 1 tablet (0.6 mg total) by mouth daily. (Patient taking differently: Take 0.6 mg by mouth as needed. ) 12/12/2018: Takes as needed for gout flare.   . Dulaglutide (TRULICITY) 1.5 EX/5.1ZG SOPN Inject  1.5 mg into the skin once a week.   [name] glucose blood test strip 1 each by Other route as needed for other. Use as instructed   . memantine (NAMENDA) 10 MG tablet TAKE 1 TABLET BY MOUTH TWICE A DAY   . metFORMIN (GLUCOPHAGE) 1000 MG tablet Take one tablet by mouth daily.   . pravastatin (PRAVACHOL) 20 MG tablet TAKE 1 TABLET BY ORAL ROUTE EVERY DAY   . valsartan (DIOVAN) 320 MG tablet TAKE 1 TABLET BY MOUTH EVERY DAY   . [DISCONTINUED] celecoxib (CELEBREX) 200 MG capsule Take 1 capsule (200 mg total) by mouth daily. (Patient not taking: Reported on 08/02/2019)   . [DISCONTINUED] cyclobenzaprine (FLEXERIL) 5 MG tablet Take 1 tablet (5 mg total) by mouth 3 (three) times daily as needed for muscle spasms.   . [DISCONTINUED] methocarbamol (ROBAXIN) 500 MG tablet Take 1 tablet (500 mg total) by mouth 4 (four) times daily.    No facility-administered encounter medications on file as of 08/05/2019.      Objective:   Goals Addressed            This Visit's Progress   . I would like to continue to optimize his medication management of my chronic conditions.       Wife stated Current Barriers:  . Polypharmacy; complex patient with multiple comorbidities including dementia, diabetes, hypertension . Wife assists with managing medications via [employer] pill packaging system.  Patient continues to take pills  out and sometimes does not take them.  Pharmacist Clinical Goal(s):  [name] Over the next 90 days, patient will work with PharmD and provider towards optimized medication management  Interventions: 08/05/19 Met with caregiver in clinic to review medication changes . Comprehensive medication review performed; medication list updated in electronic medical record . Counseled wife on working to oversee pill pack administration as patient takes pills out at times and forgets to take them.  She continues to give patient the Namenda twice daily.  She has not seen a great benefit from this. . Reviewed & discussed  the following diabetes-related information with patient: o Continue checking blood sugars as directed o Follow ADA recommended "diabetes-friendly" diet  (reviewed healthy snack/food options); Patient is eating large amounts of desserts.  Wife states she is trying to limit, however patient is headstrong at times. o Confirmed current DM regimen: Metformin once daily, Trulicity weekly (actos d/c'd due to edema/CKD) o Discussed GLP-1 injection technique.  Patient is self-administering o Reviewed medication purpose/side effects-->patient denies adverse events, denies hypoglycemia, r o Most recent A1c is 8.2% on 04/23/19 (increase from 7.5% on 01/21/19).  Wife working to give medications as prescribed, but receives resistance from patient as he is battling with dementia.   o Current anti-hypertensive regimen: carvedilol, amlodipine, valsartan o Current anti-hyperlipidemia regimen: pravastatin 4m daily o Continue taking all medications as prescribed by provider o Mail order is currently missing some of his prescriptions.  I will have these called in.  CCM RN CM Interventions: 08/02/19 call completed with spouse [name]  . Discussed Ms. GArsenio Loaderis concerned that she is not aware of recent medication changes made for [name]. Reviewed patient's current and most updated medication list; discussed patient's Coreg is not being packaged in his bubble packs, however, the patient is taking this medication as prescribed . Discussed Ms. GArsenio Loaderwill pick up a copy of [facility]. [facility]Albertamedication [facility] and [facility] to his home meds including his bubble packs . Collaboration with embedded Pharm D JLottie Dawsonregarding Ms. Gilliam's concerns; discussed Ms. GArsenio Loaderwill have a face to face encounter [facility]D [facility]  Patient Self Care Activities:  . Patient will take medications as prescribed with help from wife and  pill packaging system . Patient will focus on improved adherence by allowing wife to assist with pill pack administration   Please see past updates related to this goal by clicking on the "Past Updates" button in the selected goal           Plan:   The care management team will reach out to the patient again over the next 4 weeks.  JRegina Eck PharmD, BCPS Clinical Pharmacist, [facility] [phone]

## 2019-08-07 ENCOUNTER — Telehealth: Payer: Medicare HMO

## 2019-08-07 ENCOUNTER — Other Ambulatory Visit: Payer: Self-pay

## 2019-08-07 ENCOUNTER — Ambulatory Visit: Payer: Self-pay

## 2019-08-07 DIAGNOSIS — I10 Essential (primary) hypertension: Secondary | ICD-10-CM

## 2019-08-07 DIAGNOSIS — N1831 Chronic kidney disease, stage 3a: Secondary | ICD-10-CM

## 2019-08-07 DIAGNOSIS — I1310 Hypertensive heart and chronic kidney disease without heart failure, with stage 1 through stage 4 chronic kidney disease, or unspecified chronic kidney disease: Secondary | ICD-10-CM

## 2019-08-07 DIAGNOSIS — R413 Other amnesia: Secondary | ICD-10-CM

## 2019-08-07 DIAGNOSIS — E1121 Type 2 diabetes mellitus with diabetic nephropathy: Secondary | ICD-10-CM | POA: Diagnosis not present

## 2019-08-07 DIAGNOSIS — E1122 Type 2 diabetes mellitus with diabetic chronic kidney disease: Secondary | ICD-10-CM

## 2019-08-07 MED ORDER — MEMANTINE HCL 10 MG PO TABS: 10.0000 mg | ORAL_TABLET | Freq: Two times a day (BID) | ORAL | 4 refills | Status: DC

## 2019-08-07 MED ORDER — METFORMIN HCL 1000 MG PO TABS: ORAL_TABLET | ORAL | 0 refills | Status: DC

## 2019-08-07 MED ORDER — CARVEDILOL 12.5 MG PO TABS: 12.5000 mg | ORAL_TABLET | Freq: Two times a day (BID) | ORAL | 3 refills | Status: DC

## 2019-08-07 NOTE — Chronic Care Management (AMB) (Signed)
  Chronic Care Management   Social Work Note  08/07/2019 Name: Dylan [name]. MRN: [id number] DOB: [birth year]  SW placed an unsuccessful outbound call to the patients spouse [name] in response to communication received from RN Case Manager requesting resources for meals on wheels. HIPAA compliant voice message left requesting a return call.  Follow Up Plan: SW will follow up with patient by phone over the next two weeks,  Daneen Schick, BSW, CDP Social Worker, Certified Dementia Practitioner [facility] [phone]

## 2019-08-08 NOTE — Patient Instructions (Signed)
Visit Information  Goals Addressed    . "He's getting injections in his back"       Spouse states Current Barriers:  [name] Knowledge Deficits related to diagnosis and treatment management for lumbar pain  . Vascular dementia . Diabetes  Nurse Case Manager Clinical Goal(s):  [name] Over the next 30 days, patient/spouse will verbalize understanding of plan for treatment plan for lumbar pain . Over the next 30 days, spouse will report patient's CBG's remain to be WNL and will verbalize knowing when to call the CCM team and or PCP for abnormal readings  CCM RN CM Interventions:  08/07/19 call completed with spouse [name]   . Evaluation of current treatment plan related to lumbar spinal pain and patient's adherence to plan as established by provider. . Provided education to patient re: the importance to closely monitor patient's CBG's due to potential for Hyperglycemia while receiving steroid injections to lumbar spine . Discussed plans with patient for ongoing care management follow up and provided patient with direct contact information for care management team . Advised patient, providing education and rationale, to check cbg daily before meals and record, calling CCM team and or PCP for findings outside established parameters.  <80 and or >250  Patient Self Care Activities with assistance from spouse . Self administers medications as prescribed . Attends all scheduled provider appointments . Performs ADL's independently . Performs IADL's independently   Initial goal documentation     . "His balance is much worse and he needs a walker"       Spouse states Current Barriers:  . Impaired gait/balance . High Risk for Falls . 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 . Newly diagnosed; Mild chronic microvascular ischemic changes  Nurse Case Manager Clinical Goal(s):  [name] Over the next 30 days, patient will work with the CCM team to  address needs related to impaired gait disturbance and high risk for falls  Goal Met  . 08/02/19 Over the next 30 days, patient will complete outpatient PT and will be able to adhere to his prescribed HEP   CCM RN CM Interventions:  08/07/19 call completed with spouse Ms. [name]  . Received inbound call from spouse [name] . Evaluation of current treatment plan related to impaired physical mobility and gait disturbance and patient's adherence to plan as established by provider . Discussed Mr. Beavers rollator appears to be too small for this size . Discussed the rollator is still new and used one time indoors . Instructed [name] to return the rollator to the store where she purchased it and request to exchange the rollator for the appropriate size . Discussed plans with patient for ongoing care management follow up and provided patient with direct contact information for care management team  Patient Self Care Activities:  . Currently UNABLE TO independently perform self-care without assistance   Please see past updates related to this goal by clicking on the "Past Updates" button in the selected goal         The patient verbalized understanding of instructions provided today and declined a print copy of patient instruction materials.   Telephone follow up appointment with care management team member scheduled for: 08/23/19  [name], RN, BSN, CCM Care Management Coordinator [facility]  Direct Phone: [phone]

## 2019-08-08 NOTE — Chronic Care Management (AMB) (Signed)
Chronic Care Management   Follow Up Note   08/07/2019 Name: Dylan [name]. MRN: [id number] DOB: 1941/08/25  Referred by: [name], MD Reason for referral : Chronic Care Management (CCM RNCM Telephone Follow up)   [name] is a 78 y.o. year old male who is a primary care patient of [name], MD. The CCM team was consulted for assistance with chronic disease management and care coordination needs.    Review of patient status, including review of consultants reports, relevant laboratory and other test results, and collaboration with appropriate care team members and the patient's provider was performed as part of comprehensive patient evaluation and provision of chronic care management services.    SDOH (Social Determinants of Health) screening performed today: None. See Care Plan for related entries.   Advanced Directives Status: N See Care Plan and Vynca application for related entries.  I received an inbound call from patient's spouse [name] to report Mr. Ozment rollator walker is the wrong size.  Outpatient Encounter Medications as of 08/07/2019  Medication Sig Note  . allopurinol (ZYLOPRIM) 100 MG tablet Take 100 mg by mouth daily.    [name] amLODipine (NORVASC) 5 MG tablet TAKE 1 TABLET BY MOUTH EVERY DAY   . Ascorbic Acid (VITAMIN C) 1000 MG tablet Take 1,000 mg by mouth daily.   [name] aspirin 81 MG tablet Take 81 mg by mouth 3 (three) times a week.    . carvedilol (COREG) 12.5 MG tablet Take 1 tablet (12.5 mg total) by mouth 2 (two) times daily.   . Cholecalciferol (VITAMIN D3) 5000 units CAPS Take 5,000 Units by mouth every evening.   . Coenzyme Q10 200 MG capsule Take 200 mg by mouth daily.   [name] COLCRYS 0.6 MG tablet Take 1 tablet (0.6 mg total) by mouth daily. (Patient taking differently: Take 0.6 mg by mouth as needed. ) 12/12/2018: Takes as needed for gout flare.   . cyclobenzaprine (FLEXERIL) 5 MG tablet Take 5-10 mg by mouth at bedtime as needed for  muscle spasms.   . Dulaglutide (TRULICITY) 1.5 AL/9.3XT SOPN Inject 1.5 mg into the skin once a week.   [name] glucose blood test strip 1 each by Other route as needed for other. Use as instructed   . memantine (NAMENDA) 10 MG tablet Take 1 tablet (10 mg total) by mouth 2 (two) times daily.   . metFORMIN (GLUCOPHAGE) 1000 MG tablet Take one tablet by mouth daily.   . methocarbamol (ROBAXIN) 500 MG tablet Take 500 mg by mouth daily as needed for muscle spasms.   . pravastatin (PRAVACHOL) 20 MG tablet TAKE 1 TABLET BY ORAL ROUTE EVERY DAY   . valsartan (DIOVAN) 320 MG tablet TAKE 1 TABLET BY MOUTH EVERY DAY    No facility-administered encounter medications on file as of 08/07/2019.      Goals Addressed    . "He's getting injections in his back"       Spouse states Current Barriers:  [name] Knowledge Deficits related to diagnosis and treatment management for lumbar pain  . Vascular dementia . Diabetes  Nurse Case Manager Clinical Goal(s):  [name] Over the next 30 days, patient/spouse will verbalize understanding of plan for treatment plan for lumbar pain . Over the next 30 days, spouse will report patient's CBG's remain to be WNL and will verbalize knowing when to call the CCM team and or PCP for abnormal readings  CCM RN CM Interventions:  08/07/19 call completed with spouse [name]   .  Evaluation of current treatment plan related to lumbar spinal pain and patient's adherence to plan as established by provider. . Provided education to patient re: the importance to closely monitor patient's CBG's due to potential for Hyperglycemia while receiving steroid injections to lumbar spine . Discussed plans with patient for ongoing care management follow up and provided patient with direct contact information for care management team . Advised patient, providing education and rationale, to check cbg daily before meals and record, calling CCM team and or PCP for findings outside established parameters.  <80  and or >250  Patient Self Care Activities with assistance from spouse . Self administers medications as prescribed . Attends all scheduled provider appointments . Performs ADL's independently . Performs IADL's independently   Initial goal documentation     . "His balance is much worse and he needs a walker"       Spouse states Current Barriers:  . Impaired gait/balance . High Risk for Falls . 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 . Newly diagnosed; Mild chronic microvascular ischemic changes  Nurse Case Manager Clinical Goal(s):  [name] Over the next 30 days, patient will work with the CCM team to address needs related to impaired gait disturbance and high risk for falls  Goal Met  . 08/02/19 Over the next 30 days, patient will complete outpatient PT and will be able to adhere to his prescribed HEP   CCM RN CM Interventions:  08/07/19 call completed with spouse Ms. [name]  . Received inbound call from spouse [name] . Evaluation of current treatment plan related to impaired physical mobility and gait disturbance and patient's adherence to plan as established by provider . Discussed Mr. Sexson rollator appears to be too small for this size . Discussed the rollator is still new and used one time indoors . Instructed [name] to return the rollator to the store where she purchased it and request to exchange the rollator for the appropriate size . Discussed plans with patient for ongoing care management follow up and provided patient with direct contact information for care management team  Patient Self Care Activities:  . Currently UNABLE TO independently perform self-care without assistance   Please see past updates related to this goal by clicking on the "Past Updates" button in the selected goal          Telephone follow up appointment with care management team member scheduled for: 08/23/19   [name],  RN, BSN, CCM Care Management Coordinator [place] [facility]  Direct Phone: [phone]

## 2019-08-13 ENCOUNTER — Other Ambulatory Visit: Payer: Self-pay | Admitting: Internal Medicine

## 2019-08-20 ENCOUNTER — Ambulatory Visit: Payer: Medicare HMO

## 2019-08-20 DIAGNOSIS — F0391 Unspecified dementia with behavioral disturbance: Secondary | ICD-10-CM | POA: Diagnosis not present

## 2019-08-20 DIAGNOSIS — E1122 Type 2 diabetes mellitus with diabetic chronic kidney disease: Secondary | ICD-10-CM

## 2019-08-20 DIAGNOSIS — E1121 Type 2 diabetes mellitus with diabetic nephropathy: Secondary | ICD-10-CM | POA: Diagnosis not present

## 2019-08-20 DIAGNOSIS — N1831 Chronic kidney disease, stage 3a: Secondary | ICD-10-CM | POA: Diagnosis not present

## 2019-08-20 DIAGNOSIS — R296 Repeated falls: Secondary | ICD-10-CM

## 2019-08-20 NOTE — Chronic Care Management (AMB) (Signed)
Chronic Care Management   Social Work Follow Up Note  08/20/2019 Name: Dylan [name]. MRN: [id number] DOB: [birth year]  Dylan [name]. is a 78 y.o. year old male who is a primary care patient of [name], MD. The CCM team was consulted for assistance with care coordination.   Review of patient status, including review of consultants reports, other relevant assessments, and collaboration with appropriate care team members and the patient's provider was performed as part of comprehensive patient evaluation and provision of chronic care management services.    SW placed an outbound call to the patient's caregiver [name] in response to communication received from RN Case Manager requesting SW intervention with mobile meals.  Outpatient Encounter Medications as of 08/20/2019  Medication Sig Note  . allopurinol (ZYLOPRIM) 100 MG tablet TAKE 1 TABLET BY MOUTH EVERY DAY   . amLODipine (NORVASC) 5 MG tablet TAKE 1 TABLET BY MOUTH EVERY DAY   . Ascorbic Acid (VITAMIN C) 1000 MG tablet Take 1,000 mg by mouth daily.   [name] aspirin 81 MG tablet Take 81 mg by mouth 3 (three) times a week.    . carvedilol (COREG) 12.5 MG tablet Take 1 tablet (12.5 mg total) by mouth 2 (two) times daily.   . Cholecalciferol (VITAMIN D3) 5000 units CAPS Take 5,000 Units by mouth every evening.   . Coenzyme Q10 200 MG capsule Take 200 mg by mouth daily.   [name] COLCRYS 0.6 MG tablet Take 1 tablet (0.6 mg total) by mouth daily. (Patient taking differently: Take 0.6 mg by mouth as needed. ) 12/12/2018: Takes as needed for gout flare.   . cyclobenzaprine (FLEXERIL) 5 MG tablet Take 5-10 mg by mouth at bedtime as needed for muscle spasms.   . Dulaglutide (TRULICITY) 1.5 YH/0.6CB SOPN Inject 1.5 mg into the skin once a week.   [name] glucose blood test strip 1 each by Other route as needed for other. Use as instructed   . memantine (NAMENDA) 10 MG tablet Take 1 tablet (10 mg total) by mouth 2 (two) times daily.   .  metFORMIN (GLUCOPHAGE) 1000 MG tablet Take one tablet by mouth daily.   . methocarbamol (ROBAXIN) 500 MG tablet Take 500 mg by mouth daily as needed for muscle spasms.   . pravastatin (PRAVACHOL) 20 MG tablet TAKE 1 TABLET BY ORAL ROUTE EVERY DAY   . valsartan (DIOVAN) 320 MG tablet TAKE 1 TABLET BY MOUTH EVERY DAY    No facility-administered encounter medications on file as of 08/20/2019.      Goals Addressed            This Visit's Progress   . "His balance is much worse and he needs a walker"       Spouse states Current Barriers:  . Impaired gait/balance . High Risk for Falls . 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 . Newly diagnosed; Mild chronic microvascular ischemic changes  Nurse Case Manager Clinical Goal(s):  [name] Over the next 30 days, patient will work with the CCM team to address needs related to impaired gait disturbance and high risk for falls  Goal Met  . 08/02/19 Over the next 30 days, patient will complete outpatient PT and will be able to adhere to his prescribed HEP   CCM SW Interventions: Completed 08/20/2019 with [name] . Outbound call to Mrs Arsenio Loader to assess progression of patient goal . Determined Mrs Arsenio Loader has recently completed a bathroom renovation to remove  tub and install a walk-in shower with grab bars and bench . Assessed for recent falls  o Mrs Arsenio Loader reports patient recently fell while she was out of the home into a bush in the patients yard "he said it took him about an hour to get up" . Discussed caregiver plans for when the patient is left alone in the home- at this time Mrs Arsenio Loader unable to identify caregiver options . Collaboration with RN Case Manager regarding reported fall  CCM RN CM Interventions:  08/07/19 call completed with spouse Ms. [name]  . Received inbound call from spouse [name] . Evaluation of current treatment plan related to impaired physical mobility  and gait disturbance and patient's adherence to plan as established by provider . Discussed Mr. Murrell rollator appears to be too small for this size . Discussed the rollator is still new and used one time indoors . Instructed [name] to return the rollator to the store where she purchased it and request to exchange the rollator for the appropriate size . Discussed plans with patient for ongoing care management follow up and provided patient with direct contact information for care management team  Patient Self Care Activities:  . Currently UNABLE TO independently perform self-care without assistance   Please see past updates related to this goal by clicking on the "Past Updates" button in the selected goal      . "I don't know what the next steps are for [name] Dementia"       Spouse stated  Current Barriers:  [name] Knowledge Deficits related to next steps for diagnosis and treatment for new diagnosis of Dementia  Nurse Case Manager Clinical Goal(s):  [name] Over the next 14 days, spouse will collaborative with the Neuro office and receive instructions regarding brain imaging ordered for determination of type of Dementia - Goal Met  . Over the next 90 days, patient will verbalize understanding of plan for treatment management for new dx: Dementia  CCM SW Interventions: Completed 08/20/2019 with [name] . Outbound call to the patients caregiver [name] to assess progression of goal - "he's just deteriorating" . Assessed for new/worsening symptoms . Determined the patient is having difficulty standing on own and relies on Mrs Arsenio Loader for hands on assistance with transfers as well as shaving o "he sleeps most of the time unless we are going to therapy" . Discussed interventions surrounding in home caregiver or placement options . Mrs Arsenio Loader is not interested in placement at this time . Patient is not eligible for Medicaid and Mrs Arsenio Loader not interested in privately  hiring a caregiver at this time . Attempted to discuss neurology follow up to address patient concerns - Mrs Arsenio Loader had to end call due to needing to rinse a conditioner from her hair . Collaboration with RN Case Manager to discuss patient concerns.  . Performed chart review to identify [facility] neurology appointment over the next 4 weeks  Patient Self Care Activities with assistance/supervision from spouse . Self administers medications as prescribed . Attends all scheduled provider appointments . Performs ADL's independently . Performs IADL's independently  Please see past updates related to this goal by clicking on the "Past Updates" button in the selected goal      . "I would like for him to have meals on wheels"       [name] Stated  Current Barriers:  . ADL IADL limitations . Limited knowledge of meals on wheels program offered by [place]  Work Clinical Goal(s):  [name] Over the next 20 days the patient will successfully be placed on the wait list to receive mobile meals.  CCM SW Interventions: Completed 08/20/2019 with [place] with RN Case Manager to discuss patient benefit and interest in receiving mobile meals . Outbound call to the patients companion and caregiver [name] to discuss mobile meal program offered by [employer] . Educated Mrs Arsenio Loader on the program including eligibility guidelines, wait list length, and program operations during the COVID 19 pandemic . Obtained verbal permission to place referral via [facility] . Scheduled follow up call to Mrs Arsenio Loader over the next 3 weeks to confirm placement on wait list  Patient Self Care Activities:  . Patient currently unable to independently manage care needs. Caregiver [name] expresses understanding of interventions and plan  Initial goal documentation         Follow Up Plan: SW will follow up with patient by phone over  the next 4 weeks.   Daneen Schick, BSW, CDP Social Worker, Certified Dementia Practitioner [place] [facility] [phone]  Total time spent performing care coordination and/or care management activities with the patient by phone or face to face = 22 minutes.

## 2019-08-20 NOTE — Patient Instructions (Signed)
Social Worker Visit Information  Goals we discussed today:  Goals Addressed            This Visit's Progress   . "His balance is much worse and he needs a walker"       Spouse states Current Barriers:  . Impaired gait/balance . High Risk for Falls . 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 . Newly diagnosed; Mild chronic microvascular ischemic changes  Nurse Case Manager Clinical Goal(s):  [name] Over the next 30 days, patient will work with the CCM team to address needs related to impaired gait disturbance and high risk for falls  Goal Met  . 08/02/19 Over the next 30 days, patient will complete outpatient PT and will be able to adhere to his prescribed HEP   CCM SW Interventions: Completed 08/20/2019 with [name] . Outbound call to Mrs Arsenio Loader to assess progression of patient goal . Determined Mrs Arsenio Loader has recently completed a bathroom renovation to remove tub and install a walk-in shower with grab bars and bench . Assessed for recent falls  o Mrs Arsenio Loader reports patient recently fell while she was out of the home into a bush in the patients yard "he said it took him about an hour to get up" . Discussed caregiver plans for when the patient is left alone in the home- at this time Mrs Arsenio Loader unable to identify caregiver options . Collaboration with RN Case Manager regarding reported fall  CCM RN CM Interventions:  08/07/19 call completed with spouse Ms. [name]  . Received inbound call from spouse [name] . Evaluation of current treatment plan related to impaired physical mobility and gait disturbance and patient's adherence to plan as established by provider . Discussed Mr. [place] rollator appears to be too small for this size . Discussed the rollator is still new and used one time indoors . Instructed [name] to return the rollator to the store where she purchased it and request to exchange the rollator  for the appropriate size . Discussed plans with patient for ongoing care management follow up and provided patient with direct contact information for care management team  Patient Self Care Activities:  . Currently UNABLE TO independently perform self-care without assistance   Please see past updates related to this goal by clicking on the "Past Updates" button in the selected goal      . "I don't know what the next steps are for Mr. Berling Dementia"       Spouse stated  Current Barriers:  [name] Knowledge Deficits related to next steps for diagnosis and treatment for new diagnosis of Dementia  Nurse Case Manager Clinical Goal(s):  [name] Over the next 14 days, spouse will collaborative with the Neuro office and receive instructions regarding brain imaging ordered for determination of type of Dementia - Goal Met  . Over the next 90 days, patient will verbalize understanding of plan for treatment management for new dx: Dementia  CCM SW Interventions: Completed 08/20/2019 with [name] . Outbound call to the patients caregiver [name] to assess progression of goal - "he's just deteriorating" . Assessed for new/worsening symptoms . Determined the patient is having difficulty standing on own and relies on Mrs Arsenio Loader for hands on assistance with transfers as well as shaving o "he sleeps most of the time unless we are going to therapy" . Discussed interventions surrounding in home caregiver or placement options . Mrs Arsenio Loader is not interested in placement  at this time . Patient is not eligible for Medicaid and Mrs Arsenio Loader not interested in privately hiring a caregiver at this time . Attempted to discuss neurology follow up to address patient concerns - Mrs Arsenio Loader had to end call due to needing to rinse a conditioner from her hair . Collaboration with RN Case Manager to discuss patient concerns.  . Performed chart review to identify [facility] neurology appointment over the next 4  weeks  CCM RN CM Interventions:  08/02/19: call completed with spouse [name]  . 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 . Reviewed medications with patient and discussed spouse reports patient has been less forgetful and his personality and mood have been more consistent and stable since starting Namenda . Discussed spouse will notify Dr. Krista Blue of new or worsening s/s of dementia, mood or personality change . Confirmed [name] has the contact # for Dr. Krista Blue; discussed next OV scheduled for 11/18 @ 3:15 PM  . Discussed plans with patient for ongoing care management follow up and provided patient with direct contact information for care management team   06/10/19 Reviewed brain CT image results from 06/07/19 as ordered by Neurologist, Dr. Krista Blue  Noted the following IMPRESSION:  This CT scan of the head without contrast shows the following: 1.   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 2.   Mild chronic microvascular ischemic changes. 3.   There are no acute findings.  Results not discussed with spouse today. Per [name] she will call to schedule a follow up with the Dr. Krista Blue to review the CT results.   Patient Self Care Activities with assistance/supervision from spouse . Self administers medications as prescribed . Attends all scheduled provider appointments . Performs ADL's independently . Performs IADL's independently  Please see past updates related to this goal by clicking on the "Past Updates" button in the selected goal      . "I would like for him to have meals on wheels"       [name] Stated  Current Barriers:  . ADL IADL limitations . Limited knowledge of meals on wheels program offered by [employer]  Social Work Clinical Goal(s):  [name] Over the next 20 days the patient will successfully be placed on the wait list to receive  mobile meals.  CCM SW Interventions: Completed 08/20/2019 with [place] with RN Case Manager to discuss patient benefit and interest in receiving mobile meals . Outbound call to the patients companion and caregiver [name] to discuss mobile meal program offered by [employer] . Educated Mrs Arsenio Loader on the program including eligibility guidelines, wait list length, and program operations during the COVID 19 pandemic . Obtained verbal permission to place referral via [place] . Scheduled follow up call to Mrs Arsenio Loader over the next 3 weeks to confirm placement on wait list  Patient Self Care Activities:  . Patient currently unable to independently manage care needs. Caregiver [name] expresses understanding of interventions and plan  Initial goal documentation         Materials Provided: Verbal education about mobile meals provided by phone  Follow Up Plan: SW will follow up with patient by phone over the next 4 weeks   Daneen Schick, BSW, CDP Social Worker, Certified Dementia Practitioner [place] [facility] [phone]

## 2019-08-23 ENCOUNTER — Other Ambulatory Visit: Payer: Self-pay

## 2019-08-23 ENCOUNTER — Ambulatory Visit: Payer: Self-pay

## 2019-08-23 ENCOUNTER — Telehealth: Payer: Medicare HMO

## 2019-08-23 DIAGNOSIS — N1831 Chronic kidney disease, stage 3a: Secondary | ICD-10-CM

## 2019-08-23 DIAGNOSIS — E1122 Type 2 diabetes mellitus with diabetic chronic kidney disease: Secondary | ICD-10-CM

## 2019-08-23 DIAGNOSIS — F0391 Unspecified dementia with behavioral disturbance: Secondary | ICD-10-CM

## 2019-08-23 DIAGNOSIS — I10 Essential (primary) hypertension: Secondary | ICD-10-CM | POA: Diagnosis not present

## 2019-08-23 DIAGNOSIS — E1121 Type 2 diabetes mellitus with diabetic nephropathy: Secondary | ICD-10-CM | POA: Diagnosis not present
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
## 2019-08-29 ENCOUNTER — Other Ambulatory Visit: Payer: Self-pay | Admitting: Internal Medicine

## 2019-09-02 ENCOUNTER — Ambulatory Visit: Payer: Medicare HMO | Admitting: Pharmacist

## 2019-09-02 ENCOUNTER — Ambulatory Visit: Payer: Medicare HMO | Admitting: Podiatry

## 2019-09-02 DIAGNOSIS — N1831 Chronic kidney disease, stage 3a: Secondary | ICD-10-CM

## 2019-09-02 DIAGNOSIS — E1122 Type 2 diabetes mellitus with diabetic chronic kidney disease: Secondary | ICD-10-CM

## 2019-09-02 DIAGNOSIS — F0391 Unspecified dementia with behavioral disturbance: Secondary | ICD-10-CM

## 2019-09-03 ENCOUNTER — Ambulatory Visit: Payer: Medicare HMO | Admitting: Cardiology

## 2019-09-05 ENCOUNTER — Ambulatory Visit: Payer: Self-pay

## 2019-09-05 ENCOUNTER — Telehealth: Payer: Medicare HMO

## 2019-09-05 DIAGNOSIS — R296 Repeated falls: Secondary | ICD-10-CM

## 2019-09-05 DIAGNOSIS — E1122 Type 2 diabetes mellitus with diabetic chronic kidney disease: Secondary | ICD-10-CM

## 2019-09-05 DIAGNOSIS — N1831 Chronic kidney disease, stage 3a: Secondary | ICD-10-CM

## 2019-09-05 DIAGNOSIS — F0391 Unspecified dementia with behavioral disturbance: Secondary | ICD-10-CM

## 2019-09-05 NOTE — Patient Instructions (Signed)
Visit Information  Goals Addressed            This Visit's Progress   . I would like to continue to optimize his medication management of my chronic conditions.       Wife stated Current Barriers:  . Polypharmacy; complex patient with multiple comorbidities including dementia, diabetes, hypertension . Wife assists with managing medications via [employer] pill packaging system.  Patient continues to take pills out and sometimes does not take them.  Pharmacist Clinical Goal(s):  [name] Over the next 90 days, patient will work with PharmD and provider towards optimized medication management  Interventions: 09/02/19 completed call with patient's spouse . Comprehensive medication review performed; medication list updated in electronic medical record . Counseled wife on working to oversee pill pack administration as patient takes pills out at times and forgets to take them.  She continues to give patient the Namenda twice daily.  She has not seen a great benefit from this. . Reviewed & discussed the following diabetes-related information with patient: o Continue checking blood sugars as directed o Follow ADA recommended "diabetes-friendly" diet  (reviewed healthy snack/food options); Patient continues to eat large amounts of desserts.  Wife states she is trying to limit, however patient is headstrong at times. Discussed with her the likelihood of having to add more antidiabetes medications. o Confirmed current DM regimen: Metformin once daily, Trulicity weekly (actos d/c'd due to edema/CKD) o Discussed GLP-1 injection technique.  Patient is self-administering per wife. o Reviewed medication purpose/side effects-->wife denies adverse events, denies hypoglycemia o Most recent A1c is 8.2% on 04/23/19 (increase from 7.5% on 01/21/19).  Wife working to give medications as prescribed, but receives resistance from patient as he is battling with dementia.  They are using compliance packaging by [employer] mail  order.   o Current anti-hypertensive regimen: carvedilol, amlodipine, valsartan o Current anti-hyperlipidemia regimen: pravastatin 20mg  daily o Continue taking all medications as prescribed by provider o Patient now has all medications in pill packs as ordered at last CCM PharmD visit o Next PCP OV 09/19/19  CCM RN CM Interventions: 08/02/19 call completed with spouse [name]   . Discussed [name] is concerned that she is not aware of recent medication changes made for Mr. Overfelt . Reviewed patient's current and most updated medication list; discussed patient's Coreg is not being packaged in his bubble packs, however, the patient is taking this medication as prescribed . Discussed [name] will pick up a copy of Mr. Pryce medication list from the [facility] office and compare to his home meds including his bubble packs . Collaboration with embedded Pharm D Lottie Dawson regarding Ms. Gilliam's concerns; discussed [name] will have a face to face encounter with Rhea Bleacher D at the [facility] office on Monday 08/05/19 for medication reconciliation  Patient Self Care Activities:  . Patient will take medications as prescribed with help from wife and pill packaging system . Patient will focus on improved adherence by allowing wife to assist with pill pack administration   Please see past updates related to this goal by clicking on the "Past Updates" button in the selected goal         The patient verbalized understanding of instructions provided today and declined a print copy of patient instruction materials.   The care management team will reach out to the patient again over the next 30 days.   SIGNATURE Regina Eck, PharmD, BCPS Clinical Pharmacist, [facility] [place]  [facility]  [place]: [phone]

## 2019-09-05 NOTE — Progress Notes (Signed)
Chronic Care Management   Visit Note  09/02/2019 Name: Dylan [name]. MRN: [id number] DOB: January 31, 1941  Referred by: [name], MD Reason for referral : Chronic Care Management   Dylan Cropper. is a 78 y.o. year old male who is a primary care patient of [name], MD. 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    I spoke with Dylan [name] spouse by telephone today.  Medications: Outpatient Encounter Medications as of 09/02/2019  Medication Sig Note  . allopurinol (ZYLOPRIM) 100 MG tablet TAKE 1 TABLET BY MOUTH EVERY DAY   . amLODipine (NORVASC) 5 MG tablet TAKE 1 TABLET BY MOUTH DAILY   . Ascorbic Acid (VITAMIN C) 1000 MG tablet Take 1,000 mg by mouth daily.   [name] aspirin 81 MG tablet Take 81 mg by mouth 3 (three) times a week.    . carvedilol (COREG) 12.5 MG tablet Take 1 tablet (12.5 mg total) by mouth 2 (two) times daily.   . Cholecalciferol (VITAMIN D3) 5000 units CAPS Take 5,000 Units by mouth every evening.   . Coenzyme Q10 200 MG capsule Take 200 mg by mouth daily.   [name] COLCRYS 0.6 MG tablet Take 1 tablet (0.6 mg total) by mouth daily. (Patient taking differently: Take 0.6 mg by mouth as needed. ) 12/12/2018: Takes as needed for gout flare.   . cyclobenzaprine (FLEXERIL) 5 MG tablet Take 5-10 mg by mouth at bedtime as needed for muscle spasms.   . Dulaglutide (TRULICITY) 1.5 ZT/2.4PY SOPN Inject 1.5 mg into the skin once a week.   [name] glucose blood test strip 1 each by Other route as needed for other. Use as instructed   . memantine (NAMENDA) 10 MG tablet Take 1 tablet (10 mg total) by  mouth 2 (two) times daily.   . metFORMIN (GLUCOPHAGE) 1000 MG tablet Take one tablet by mouth daily.   . methocarbamol (ROBAXIN) 500 MG tablet Take 500 mg by mouth daily as needed for muscle spasms.   . pravastatin (PRAVACHOL) 20 MG tablet TAKE 1 TABLET BY ORAL ROUTE EVERY DAY   . valsartan (DIOVAN) 320 MG tablet TAKE 1 TABLET BY MOUTH EVERY DAY    No facility-administered encounter medications on file as of 09/02/2019.      Objective:   Goals Addressed            This Visit's Progress   . I would like to continue to optimize his medication management of my chronic conditions.       Wife stated Current Barriers:  . Polypharmacy; complex patient with multiple comorbidities including dementia, diabetes, hypertension . Wife assists with managing medications via [employer] pill packaging system.  Patient continues to take pills out and sometimes does not take them.  Pharmacist Clinical Goal(s):  [name] Over the next 90 days, patient will work with PharmD and provider towards optimized medication management  Interventions: 09/02/19 completed call with patient's spouse . Comprehensive medication review performed; medication list updated in electronic medical record . Counseled wife on working to oversee pill pack administration as patient takes pills out at times and forgets to take them.  She continues to give patient the Namenda twice daily.  She  has not seen a great benefit from this. . Reviewed & discussed the following diabetes-related information with patient: o Continue checking blood sugars as directed o Follow ADA recommended "diabetes-friendly" diet  (reviewed healthy snack/food options); Patient continues to eat large amounts of desserts.  Wife states she is trying to limit, however patient is headstrong at times. Discussed with her the likelihood of having to add more antidiabetes medications. o Confirmed current DM regimen: Metformin once daily, Trulicity weekly (actos [name]/c'[name] due to  edema/CKD) o Discussed GLP-1 injection technique.  Patient is self-administering per wife. o Reviewed medication purpose/side effects-->wife denies adverse events, denies hypoglycemia o Most recent A1c is 8.2% on 04/23/19 (increase from 7.5% on 01/21/19).  Wife working to give medications as prescribed, but receives resistance from patient as he is battling with dementia.  They are using compliance packaging by [facility] [employer] mail order.   o Current anti-hypertensive regimen: carvedilol, amlodipine, valsartan o Current anti-hyperlipidemia regimen: pravastatin 20mg  daily o Continue taking all medications as prescribed by provider o Patient now has all medications in pill packs as ordered at last CCM PharmD visit o Next PCP OV 09/19/19  CCM RN CM Interventions: 08/02/19 call completed with spouse [name]   . Discussed [name] is concerned that she is not aware of recent medication changes made for Dylan [name] . Reviewed patient's current and most updated medication list; discussed patient's Coreg is not being packaged in his bubble packs, however, the patient is taking this medication as prescribed . Discussed [name] will pick up a copy of Dylan [name] medication list from the [facility] office and compare to his home meds including his bubble packs . Collaboration with embedded Pharm [name] Lottie Dawson regarding Ms. Gilliam's concerns; discussed [name] will have a face to face encounter with Dylan [name] at the [facility] office on Monday 08/05/19 for medication reconciliation  Patient Self Care Activities:  . Patient will take medications as prescribed with help from wife and pill packaging system . Patient will focus on improved adherence by allowing wife to assist with pill pack administration   Please see past updates related to this goal by clicking on the "Past Updates" button in the selected goal           Plan:   The care management team will reach out to the patient  again over the next 30 days.   Provider Signature Regina Eck, PharmD, BCPS Clinical Pharmacist, [facility] [place]: [phone]

## 2019-09-06 NOTE — Chronic Care Management (AMB) (Signed)
Chronic Care Management    Clinical Social Work Follow Up Note  09/05/2019 Name: Dylan [name]. MRN: [id number] DOB: 02-Jul-1941  Dylan [name]. is a 78 y.o. year old male who is a primary care patient of [name], MD. The CCM team was consulted for assistance with care coordination.   Review of patient status, including review of consultants reports, other relevant assessments, and collaboration with appropriate care team members and the patient's provider was performed as part of comprehensive patient evaluation and provision of chronic care management services.    SW placed an outbound call to the patients caregiver and fiance [name] to assist with care coordination of patient case management needs.  Outpatient Encounter Medications as of 09/05/2019  Medication Sig Note  . allopurinol (ZYLOPRIM) 100 MG tablet TAKE 1 TABLET BY MOUTH EVERY DAY   . amLODipine (NORVASC) 5 MG tablet TAKE 1 TABLET BY MOUTH DAILY   . Ascorbic Acid (VITAMIN C) 1000 MG tablet Take 1,000 mg by mouth daily.   [name] aspirin 81 MG tablet Take 81 mg by mouth 3 (three) times a week.    . carvedilol (COREG) 12.5 MG tablet Take 1 tablet (12.5 mg total) by mouth 2 (two) times daily.   . Cholecalciferol (VITAMIN D3) 5000 units CAPS Take 5,000 Units by mouth every evening.   . Coenzyme Q10 200 MG capsule Take 200 mg by mouth daily.   [name] COLCRYS 0.6 MG tablet Take 1 tablet (0.6 mg total) by mouth daily. (Patient taking differently: Take 0.6 mg by mouth as needed. ) 12/12/2018: Takes as needed for gout flare.   . cyclobenzaprine (FLEXERIL) 5 MG tablet Take 5-10 mg by mouth at bedtime as needed for muscle spasms.   . Dulaglutide (TRULICITY) 1.5 HR/4.1UL SOPN Inject 1.5 mg into the skin once a week.   [name] glucose blood test strip 1 each by Other route as needed for other. Use as instructed   . memantine (NAMENDA) 10 MG tablet Take 1 tablet (10 mg total) by mouth 2 (two) times daily.   . metFORMIN (GLUCOPHAGE) 1000  MG tablet Take one tablet by mouth daily.   . methocarbamol (ROBAXIN) 500 MG tablet Take 500 mg by mouth daily as needed for muscle spasms.   . pravastatin (PRAVACHOL) 20 MG tablet TAKE 1 TABLET BY ORAL ROUTE EVERY DAY   . valsartan (DIOVAN) 320 MG tablet TAKE 1 TABLET BY MOUTH EVERY DAY    No facility-administered encounter medications on file as of 09/05/2019.      Goals Addressed            This Visit's Progress   . "His balance is much worse and he needs a walker"       Spouse states Current Barriers:  . Impaired gait/balance . High Risk for Falls . 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 . Newly diagnosed; Mild chronic microvascular ischemic changes  Nurse Case Manager Clinical Goal(s):  [name] Over the next 30 days, patient will work with the CCM team to address needs related to impaired gait disturbance and high risk for falls  Goal Met  . 08/02/19 Over the next 30 days, patient will complete outpatient PT and will be able to adhere to his prescribed HEP   CCM SW Interventions: Completed 09/05/2019 with [name] . Outbound call to Mrs Arsenio Loader to assess progression of patient goal . Determined Mrs Arsenio Loader is experiencing difficulty helping the patient to stand due to  increased weakness . Advise Mrs Arsenio Loader to speak with the patients PT to request training on safest way to assist the patient with transfers   Patient Self Care Activities:  . Currently UNABLE TO independently perform self-care without assistance   Please see past updates related to this goal by clicking on the "Past Updates" button in the selected goal      . "I would like for him to have meals on wheels"       [name] Stated  Current Barriers:  . ADL IADL limitations . Limited knowledge of meals on wheels program offered by [employer]  Social Work Clinical Goal(s):  [name] Over the next 20 days the patient will successfully be  placed on the wait list to receive mobile meals.  CCM SW Interventions: Completed 09/06/2019 with [place] with Francene Finders with [facility] to confirm receipt of referral . Confirmed patient referral received but without home address . Outbound call placed to [name] to obtain home address . Provided home address to Francene Finders to allow patient to be placed on wait list  Patient Self Care Activities:  . Patient currently unable to independently manage care needs. Caregiver [name] expresses understanding of interventions and plan  Please see past updates related to this goal by clicking on the "Past Updates" button in the selected goal          Follow Up Plan: SW will follow up with patient by phone over the next month   Daneen Schick, BSW, CDP Social Worker, Certified Dementia Practitioner [name] [phone]  Total time spent performing care coordination and/or care management activities with the patient by phone or face to face = 21 minutes.

## 2019-09-06 NOTE — Patient Instructions (Signed)
Social Worker Visit Information  Goals we discussed today:  Goals Addressed            This Visit's Progress   . "His balance is much worse and he needs a walker"       Spouse states Current Barriers:  . Impaired gait/balance . High Risk for Falls . 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 . Newly diagnosed; Mild chronic microvascular ischemic changes  Nurse Case Manager Clinical Goal(s):  [name] Over the next 30 days, patient will work with the CCM team to address needs related to impaired gait disturbance and high risk for falls  Goal Met  . 08/02/19 Over the next 30 days, patient will complete outpatient PT and will be able to adhere to his prescribed HEP   CCM SW Interventions: Completed 09/05/2019 with [name] . Outbound call to Mrs Arsenio Loader to assess progression of patient goal . Determined Mrs Arsenio Loader is experiencing difficulty helping the patient to stand due to increased weakness . Advise Mrs Arsenio Loader to speak with the patients PT to request training on safest way to assist the patient with transfers  CCM RN CM Interventions:  08/07/19 call completed with spouse Ms. [name]  . Received inbound call from spouse [name] . Evaluation of current treatment plan related to impaired physical mobility and gait disturbance and patient's adherence to plan as established by provider . Discussed Mr. Husted rollator appears to be too small for this size . Discussed the rollator is still new and used one time indoors . Instructed [name] to return the rollator to the store where she purchased it and request to exchange the rollator for the appropriate size . Discussed plans with patient for ongoing care management follow up and provided patient with direct contact information for care management team  Patient Self Care Activities:  . Currently UNABLE TO independently perform self-care without assistance   Please see  past updates related to this goal by clicking on the "Past Updates" button in the selected goal      . "I would like for him to have meals on wheels"       [name] Stated  Current Barriers:  . ADL IADL limitations . Limited knowledge of meals on wheels program offered by [employer]  Social Work Clinical Goal(s):  [name] Over the next 20 days the patient will successfully be placed on the wait list to receive mobile meals.  CCM SW Interventions: Completed 09/06/2019 with [place] with Francene Finders with [facility] to confirm receipt of referral . Confirmed patient referral received but without home address . Outbound call placed to [name] to obtain home address . Provided home address to Francene Finders to allow patient to be placed on wait list  Patient Self Care Activities:  . Patient currently unable to independently manage care needs. Caregiver [name] expresses understanding of interventions and plan  Please see past updates related to this goal by clicking on the "Past Updates" button in the selected goal           Follow Up Plan: SW will follow up with patient by phone over the next month   Daneen Schick, BSW, CDP Social Worker, Certified Dementia Practitioner [facility] [phone]

## 2019-09-10 ENCOUNTER — Telehealth: Payer: Self-pay

## 2019-09-10 ENCOUNTER — Other Ambulatory Visit: Payer: Self-pay | Admitting: Internal Medicine

## 2019-09-10 NOTE — Telephone Encounter (Signed)
I left the pt a message to call the office back.  I was calling the pt to let him know that [name] wanted me to check to see if the pt has stopped taking the Pioglitazone as instructed.

## 2019-09-11 ENCOUNTER — Telehealth: Payer: Self-pay

## 2019-09-11 NOTE — Telephone Encounter (Signed)
Dylan [name] was told that [name] wanted to contact the the pt to make sure that the pt isn't taking the pioglitazone medication.  Dylan [name] said that she is pretty sure that he has stopped the medication but that she will make sure he has.

## 2019-09-12 ENCOUNTER — Telehealth: Payer: Self-pay

## 2019-09-12 NOTE — Telephone Encounter (Signed)
[name] the pt's spouse called back and said that she doesn't think that the pt is still taking the Pioglitazone medication and that she thinks that he taking any of his other meds that he is supposed to be still taking.  I suggested that she get the pt a medication box with the days of the week to see if that would help him to take his meds.  [name] said to let [name] know what was said about the pt not taking his meds.

## 2019-09-13 ENCOUNTER — Ambulatory Visit (INDEPENDENT_AMBULATORY_CARE_PROVIDER_SITE_OTHER): Payer: Medicare HMO

## 2019-09-13 DIAGNOSIS — E1121 Type 2 diabetes mellitus with diabetic nephropathy: Secondary | ICD-10-CM

## 2019-09-13 DIAGNOSIS — N1831 Chronic kidney disease, stage 3a: Secondary | ICD-10-CM

## 2019-09-13 DIAGNOSIS — F0391 Unspecified dementia with behavioral disturbance: Secondary | ICD-10-CM | POA: Diagnosis not present

## 2019-09-13 DIAGNOSIS — I10 Essential (primary) hypertension: Secondary | ICD-10-CM | POA: Diagnosis not present

## 2019-09-13 DIAGNOSIS — E1122 Type 2 diabetes mellitus with diabetic chronic kidney disease: Secondary | ICD-10-CM

## 2019-09-16 NOTE — Chronic Care Management (AMB) (Signed)
Chronic Care Management   Follow Up Note   09/13/2019 Name: Dylan [name]. MRN: [id number] DOB: [birth year]  Referred by: Dylan Chard, MD Reason for referral : Chronic Care Management (CCM RNCM Telephone Follow up)   Dylan [name]. is a 78 y.o. year old male who is a primary care patient of Dylan Chard, MD. The CCM team was consulted for assistance with chronic disease management and care coordination needs.    Review of patient status, including review of consultants reports, relevant laboratory and other test results, and collaboration with appropriate care team members and the patient's provider was performed as part of comprehensive patient evaluation and provision of chronic care management services.    SDOH (Social Determinants of Health) screening performed today: None. See Care Plan for related entries.   Inbound call received from spouse Dylan [name] to discuss Dylan [name] dementia, impaired physical mobility and incontinence.   Outpatient Encounter Medications as of 09/13/2019  Medication Sig Note  . allopurinol (ZYLOPRIM) 100 MG tablet TAKE 1 TABLET BY MOUTH EVERY DAY   . amLODipine (NORVASC) 5 MG tablet TAKE 1 TABLET BY MOUTH DAILY   . Ascorbic Acid (VITAMIN C) 1000 MG tablet Take 1,000 mg by mouth daily.   [name] aspirin 81 MG tablet Take 81 mg by mouth 3 (three) times a week.    . carvedilol (COREG) 12.5 MG tablet Take 1 tablet (12.5 mg total) by mouth 2 (two) times daily.   . Cholecalciferol (VITAMIN D3) 5000 units CAPS Take 5,000 Units by mouth every evening.   . Coenzyme Q10 200 MG capsule Take 200 mg by mouth daily.   [name] COLCRYS 0.6 MG tablet Take 1 tablet (0.6 mg total) by mouth daily. (Patient taking differently: Take 0.6 mg by mouth as needed. ) 12/12/2018: Takes as needed for gout flare.   . cyclobenzaprine (FLEXERIL) 5 MG tablet Take 5-10 mg by mouth at bedtime as needed for muscle spasms.   [name] glucose blood test strip 1 each by Other route as needed for  other. Use as instructed   . memantine (NAMENDA) 10 MG tablet Take 1 tablet (10 mg total) by mouth 2 (two) times daily.   . metFORMIN (GLUCOPHAGE) 1000 MG tablet Take one tablet by mouth daily.   . methocarbamol (ROBAXIN) 500 MG tablet Take 500 mg by mouth daily as needed for muscle spasms.   . pravastatin (PRAVACHOL) 20 MG tablet TAKE 1 TABLET BY ORAL ROUTE EVERY DAY   . TRULICITY 1.5 QQ/5.9DG SOPN INJECT 1.5 MG INTO THE SKIN ONCE A WEEK.   . valsartan (DIOVAN) 320 MG tablet TAKE 1 TABLET BY MOUTH EVERY DAY    No facility-administered encounter medications on file as of 09/13/2019.      Goals Addressed    . "He's having more urinary incontinence"       Spouse stated Current Barriers:  [name] Knowledge Deficits related to diagnosis and treatment of urinary incontinence  Nurse Case Manager Clinical Goal(s):  [name] Over the next 60 days, patient will work with PCP to address needs related to urinary incontinence  CCM RN CM Interventions:  09/13/19 call completed with spouse Dylan [name]  . Evaluation of current treatment plan related to Urinary incontinence and patient's adherence to plan as established by provider. . Advised patient to report ongoing issues with urinary incontinence to Neurologist at next visit scheduled for 09/18/19 _0 :15 PM to determine if this issue is related to patient's chronic progressive vascular dementia; Encouraged to also discuss with Dr. Baird [name]  at next OV scheduled for 01/17/19 to further evaluate and seek medical advise   . Collaborated with Dr. Baird [name] regarding patient's worsening incontinence and behavioral changes . Discussed plans with patient for ongoing care management follow up and provided patient with direct contact information for care management team . Reviewed and discussed patient's next OV with Dr. Baird [name] set for 09/19/19 _0 :57 PM   Patient Self Care Activities:  . Unable to independently perform Self Care  Please see past updates related to this goal by  clicking on the "Past Updates" button in the selected goal      . "His balance is much worse and he needs a walker"       Spouse states Current Barriers:  . Impaired gait/balance . High Risk for Falls . 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 . Newly diagnosed; Mild chronic microvascular ischemic changes  Nurse Case Manager Clinical Goal(s):  [name] Over the next 30 days, patient will work with the CCM team to address needs related to impaired gait disturbance and high risk for falls  Goal Met  . 08/02/19 Over the next 30 days, patient will complete outpatient PT and will be able to adhere to his prescribed HEP Goal Partially Met  CCM RN CM Interventions:  09/13/19 call completed with spouse [name] [name]  . Received inbound call from spouse Dylan [name] . Evaluation of current treatment plan related to impaired physical mobility and gait disturbance and patient's adherence to plan as established by provider . Discussed Dylan [name] has been d/c from outpatient PT and is not following his HEP . Discussed [name] [name] concerns regarding Dylan [name] ongoing issues with poor gait and balance . Encouraged [name] [name] to report her concerns to Dr. Krista [name] at next appointment scheduled for 09/18/19 _1 :15 as this decline may indicative of his chronic progressive dementia . Discussed the importance of keeping PCP Dr. Baird [name] well informed as well and to report any/all falls promptly if they occur . Encouraged [name] [name] to encourage the use of Dylan [name] using his rollator at all times when ambulating and to have him wear good supportive shoes to help reduce the risk of falling . Discussed plans with spouse for ongoing care management follow up and confirmed [name] [name] having the direct contact information for care management team  Patient Self Care Activities:  . Currently UNABLE TO independently perform self-care without assistance   Please see  past updates related to this goal by clicking on the "Past Updates" button in the selected goal      . "I don't know what the next steps are for Mr. Barefoot Dementia"       Spouse stated  Current Barriers:  [name] Knowledge Deficits related to next steps for diagnosis and treatment for new diagnosis of Dementia  Nurse Case Manager Clinical Goal(s):  [name] Over the next 14 days, spouse will collaborative with the Neuro office and receive instructions regarding brain imaging ordered for determination of type of Dementia - Goal Met  . Over the next 90 days, patient will verbalize understanding of plan for treatment management for new dx: Dementia  CCM RN CM Interventions:  09/13/19: call completed with spouse Dylan [name]  . Inbound call received from spouse Dylan [name] to discuss ongoing concerns about Mr. Mance dementia  . Discussed Mr. Edmondson is having some bizarre behaviors; she reports he was noted to be walking around the house unclothed and was unaware that he was not  fully dressed  . Reinforced the importance of [name] [name] reporting patient's symptoms to his treating Neurologist, Dr. Krista [name]; discussed patient has a follow up appointment with [name] Neuro DNP on 09/18/19 at 3:15 PM; as well as PCP Dr. Baird [name] to keep her informed of patient's Neuro status and prescribed plan of care recommended by Neuro  . Discussed [name] [name] will contact Dr. Rhea Belton office a day ahead of Mr. Sardo OV to report her concerns in private w/o causing Mr. Langhorst to become angry and irritated if discussed in person . Sent in basket message to Dr. Baird [name] to advise of spouse's reported symptoms . Discussed plans with [name] [name] for ongoing care management follow up and confirmed she has the direct contact information for care management team  06/10/19 Reviewed brain CT image results from 06/07/19 as ordered by Neurologist, Dr. Krista [name]  Noted the following IMPRESSION:  This CT scan of the head without contrast  shows the following: 1.   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 2.   Mild chronic microvascular ischemic changes. 3.   There are no acute findings.  Results not discussed with spouse today. Per [name] [name] she will call to schedule a follow up with the Dr. Krista [name] to review the CT results.   Patient Self Care Activities with assistance/supervision from spouse . Self administers medications as prescribed . Attends all scheduled provider appointments . Performs ADL's independently . Performs IADL's independently  Please see past updates related to this goal by clicking on the "Past Updates" button in the selected goal      . COMPLETED: "I would like for him to have meals on wheels"       Dylan [name] Stated  Current Barriers:  . ADL IADL limitations . Limited knowledge of meals on wheels program offered by [employer]  Social Work Clinical Goal(s):  [name] Over the next 20 days the patient will successfully be placed on the wait list to receive mobile meals. Goal Met  CCM RN CM Interventions: 09/13/19 call completed with patient's spouse Dylan [name]  . Confirmed patient is receiving his meals on wheels and is enjoying the meals  Patient Self Care Activities:  . Patient currently unable to independently manage care needs. Caregiver Dylan [name] expresses understanding of interventions and plan  Please see past updates related to this goal by clicking on the "Past Updates" button in the selected goal      . I would like to continue to optimize his medication management of my chronic conditions.       Wife stated Current Barriers:  . Polypharmacy; complex patient with multiple comorbidities including dementia, diabetes, hypertension . Wife assists with managing medications via [facility] pill packaging system.  Patient continues to take pills out and sometimes does not take them.  Pharmacist Clinical Goal(s):  [name] Over the next 90  days, patient will work with PharmD and provider towards optimized medication management  Interventions: 09/02/19 completed call with patient's spouse . Comprehensive medication review performed; medication list updated in electronic medical record . Counseled wife on working to oversee pill pack administration as patient takes pills out at times and forgets to take them.  She continues to give patient the Namenda twice daily.  She has not seen a great benefit from this. . Reviewed & discussed the following diabetes-related information with patient: o Continue checking blood sugars as directed o Follow ADA recommended "diabetes-friendly" diet  (reviewed healthy snack/food options); Patient  continues to eat large amounts of desserts.  Wife states she is trying to limit, however patient is headstrong at times. Discussed with her the likelihood of having to add more antidiabetes medications. o Confirmed current DM regimen: Metformin once daily, Trulicity weekly (actos d/c'd due to edema/CKD) o Discussed GLP-1 injection technique.  Patient is self-administering per wife. o Reviewed medication purpose/side effects-->wife denies adverse events, denies hypoglycemia o Most recent A1c is 8.2% on 04/23/19 (increase from 7.5% on 01/21/19).  Wife working to give medications as prescribed, but receives resistance from patient as he is battling with dementia.  They are using compliance packaging by [facility] mail order.   o Current anti-hypertensive regimen: carvedilol, amlodipine, valsartan o Current anti-hyperlipidemia regimen: pravastatin 64m daily o Continue taking all medications as prescribed by provider o Patient now has all medications in pill packs as ordered at last CCM PharmD visit o Next PCP OV 09/19/19  CCM RN CM Interventions: 09/13/19 call completed with spouse [name]  . Inbound call received from patient's spouse GStaci Righterto discuss her ongoing concerns about Mr. JRostad not taking his medications correctly . Discussed he is not allowing her to supervise or assist with his medication administeration . Discussed Mr. JBonadonnahas received an overfill of his medications due to GPeter Congobelieves he is not taking them as prescribed leaving medications left over at the end of the month . Encouraged GPeter Congoto discuss these concerns with Dr. SBaird Cancerat next visit scheduled with Mr. JVeltrefor 09/19/19 @ 3:45 PM  . Discussed GPeter Congobelieves if she brings up the concern Mr. JBibbeewill be angry; discussed she would like for the CCM team to make Dr. SBaird Canceraware of her concerns to discuss with Mr. JMiddlesworthduring his OV . Discussed GPeter Congobelieves if Dr. SBaird Cancerinstructs the patient to have GPeter Congoassist with his medications he will be more cooperative in doing so . Sent in basket message to Dr. SBaird Cancerwith an update concerns Ms. GLissa Moralesconcerns . Discussed plans with spouse for ongoing care management follow up and confirmed Ms. GArsenio Loaderhas the  direct contact information for care management team  Patient Self Care Activities:  . Patient will take medications as prescribed with help from wife and pill packaging system . Patient will focus on improved adherence by allowing wife to assist with pill pack administration  Please see past updates related to this goal by clicking on the "Past Updates" button in the selected goal         Telephone follow up appointment with care management team member scheduled for: 10/07/19  ABarb Merino RN, BSN, CCM Care Management Coordinator [facility]  Direct Phone: [phone]

## 2019-09-16 NOTE — Patient Instructions (Signed)
Visit Information  Goals Addressed    . "He's having more urinary incontinence"       Spouse stated Current Barriers:  [name] Knowledge Deficits related to diagnosis and treatment of urinary incontinence  Nurse Case Manager Clinical Goal(s):  [name] Over the next 60 days, patient will work with PCP to address needs related to urinary incontinence  CCM RN CM Interventions:  09/13/19 call completed with spouse [name]  . Evaluation of current treatment plan related to Urinary incontinence and patient's adherence to plan as established by provider. . Advised patient to report ongoing issues with urinary incontinence to Neurologist at next visit scheduled for 09/18/19 '@3' :15 PM to determine if this issue is related to patient's chronic progressive vascular dementia; Encouraged to also discuss with [name] at next OV scheduled for 01/17/19 to further evaluate and seek medical advise   . Collaborated with [name] regarding patient's worsening incontinence and behavioral changes . Discussed plans with patient for ongoing care management follow up and provided patient with direct contact information for care management team . Reviewed and discussed patient's next OV with [name] set for 09/19/19 '@3' :52 PM   Patient Self Care Activities:  . Unable to independently perform Self Care  Please see past updates related to this goal by clicking on the "Past Updates" button in the selected goal      . "His balance is much worse and he needs a walker"       Spouse states Current Barriers:  . Impaired gait/balance . High Risk for Falls . 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 . Newly diagnosed; Mild chronic microvascular ischemic changes  Nurse Case Manager Clinical Goal(s):  [name] Over the next 30 days, patient will work with the CCM team to address needs related to impaired gait disturbance and high risk for falls  Goal Met  . 08/02/19 Over the  next 30 days, patient will complete outpatient PT and will be able to adhere to his prescribed HEP Goal Partially Met  CCM RN CM Interventions:  09/13/19 call completed with spouse Ms. [name]  . Received inbound call from spouse [name] . Evaluation of current treatment plan related to impaired physical mobility and gait disturbance and patient's adherence to plan as established by provider . Discussed Mr. Kluth has been d/c from outpatient PT and is not following his HEP . Discussed Ms. Alonna Buckler concerns regarding Mr. Drumwright ongoing issues with poor gait and balance . Encouraged [name] to report her concerns to Dr. Krista Blue at next appointment scheduled for 09/18/19 '@3' :15 as this decline may indicative of his chronic progressive dementia . Discussed the importance of keeping PCP [name] well informed as well and to report any/all falls promptly if they occur . Encouraged [name] to encourage the use of Mr. Langlinais using his rollator at all times when ambulating and to have him wear good supportive shoes to help reduce the risk of falling . Discussed plans with spouse for ongoing care management follow up and confirmed [name] having the direct contact information for care management team  Patient Self Care Activities:  . Currently UNABLE TO independently perform self-care without assistance   Please see past updates related to this goal by clicking on the "Past Updates" button in the selected goal      . "I don't know what the next steps are for Mr. Releford Dementia"       Spouse stated  Current  Barriers:  [name] Knowledge Deficits related to next steps for diagnosis and treatment for new diagnosis of Dementia  Nurse Case Manager Clinical Goal(s):  [name] Over the next 14 days, spouse will collaborative with the Neuro office and receive instructions regarding brain imaging ordered for determination of type of Dementia - Goal Met  . Over the next 90 days, patient will  verbalize understanding of plan for treatment management for new dx: Dementia  CCM RN CM Interventions:  09/13/19: call completed with spouse [name]  . Inbound call received from spouse [name] to discuss ongoing concerns about Mr. Hornbaker dementia  . Discussed Mr. Beckers is having some bizarre behaviors; she reports he was noted to be walking around the house unclothed and was unaware that he was not fully dressed  . Reinforced the importance of [name] reporting patient's symptoms to his treating Neurologist, Dr. Krista Blue; discussed patient has a follow up appointment with [name] Neuro DNP on 09/18/19 at 3:15 PM; as well as PCP [name] to keep her informed of patient's Neuro status and prescribed plan of care recommended by Neuro  . Discussed [name] will contact Dr. Rhea Belton office a day ahead of Mr. Latorre OV to report her concerns in private w/o causing Mr. Armstrong to become angry and irritated if discussed in person . Sent in basket message to [name] to advise of spouse's reported symptoms . Discussed plans with [name] for ongoing care management follow up and confirmed she has the direct contact information for care management team  06/10/19 Reviewed brain CT image results from 06/07/19 as ordered by Neurologist, Dr. Krista Blue  Noted the following IMPRESSION:  This CT scan of the head without contrast shows the following: 1.   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 2.   Mild chronic microvascular ischemic changes. 3.   There are no acute findings.  Results not discussed with spouse today. Per [name] she will call to schedule a follow up with the Dr. Krista Blue to review the CT results.   Patient Self Care Activities with assistance/supervision from spouse . Self administers medications as prescribed . Attends all scheduled provider appointments . Performs ADL's independently . Performs IADL's independently  Please see  past updates related to this goal by clicking on the "Past Updates" button in the selected goal      . COMPLETED: "I would like for him to have meals on wheels"       [name] Stated  Current Barriers:  . ADL IADL limitations . Limited knowledge of meals on wheels program offered by [employer]  Social Work Clinical Goal(s):  [name] Over the next 20 days the patient will successfully be placed on the wait list to receive mobile meals. Goal Met  CCM RN CM Interventions: 09/13/19 call completed with patient's spouse [name]  . Confirmed patient is receiving his meals on wheels and is enjoying the meals  Patient Self Care Activities:  . Patient currently unable to independently manage care needs. Caregiver [name] expresses understanding of interventions and plan  Please see past updates related to this goal by clicking on the "Past Updates" button in the selected goal      . I would like to continue to optimize his medication management of my chronic conditions.       Wife stated Current Barriers:  . Polypharmacy; complex patient with multiple comorbidities including dementia, diabetes, hypertension . Wife assists with managing  medications via [facility] pill packaging system.  Patient continues to take pills out and sometimes does not take them.  Pharmacist Clinical Goal(s):  [name] Over the next 90 days, patient will work with PharmD and provider towards optimized medication management  Interventions: 09/02/19 completed call with patient's spouse . Comprehensive medication review performed; medication list updated in electronic medical record . Counseled wife on working to oversee pill pack administration as patient takes pills out at times and forgets to take them.  She continues to give patient the Namenda twice daily.  She has not seen a great benefit from this. . Reviewed & discussed the following diabetes-related information with patient: o Continue checking  blood sugars as directed o Follow ADA recommended "diabetes-friendly" diet  (reviewed healthy snack/food options); Patient continues to eat large amounts of desserts.  Wife states she is trying to limit, however patient is headstrong at times. Discussed with her the likelihood of having to add more antidiabetes medications. o Confirmed current DM regimen: Metformin once daily, Trulicity weekly (actos d/c'd due to edema/CKD) o Discussed GLP-1 injection technique.  Patient is self-administering per wife. o Reviewed medication purpose/side effects-->wife denies adverse events, denies hypoglycemia o Most recent A1c is 8.2% on 04/23/19 (increase from 7.5% on 01/21/19).  Wife working to give medications as prescribed, but receives resistance from patient as he is battling with dementia.  They are using compliance packaging by [facility] mail order.   o Current anti-hypertensive regimen: carvedilol, amlodipine, valsartan o Current anti-hyperlipidemia regimen: pravastatin 62m daily o Continue taking all medications as prescribed by provider o Patient now has all medications in pill packs as ordered at last CCM PharmD visit o Next PCP OV 09/19/19  CCM RN CM Interventions: 09/13/19 call completed with spouse GStaci Righter  . Inbound call received from patient's spouse GStaci Righterto discuss her ongoing concerns about Mr. JHassingnot taking his medications correctly . Discussed he is not allowing her to supervise or assist with his medication administeration . Discussed Mr. JMajchrzakhas received an overfill of his medications due to GPeter Congobelieves he is not taking them as prescribed leaving medications left over at the end of the month . Encouraged GPeter Congoto discuss these concerns with Dr. SBaird Cancerat next visit scheduled with Mr. JHeinkelfor 09/19/19 @ 3:45 PM  . Discussed GPeter Congobelieves if she brings up the concern Mr. JCoranwill be angry; discussed she would like for the CCM team to make Dr.  SBaird Canceraware of her concerns to discuss with Mr. JIsabellduring his OV . Discussed GPeter Congobelieves if Dr. SBaird Cancerinstructs the patient to have GPeter Congoassist with his medications he will be more cooperative in doing so . Sent in basket message to Dr. SBaird Cancerwith an update concerns Ms. GLissa Moralesconcerns . Discussed plans with spouse for ongoing care management follow up and confirmed Ms. GArsenio Loaderhas the  direct contact information for care management team  Patient Self Care Activities:  . Patient will take medications as prescribed with help from wife and pill packaging system . Patient will focus on improved adherence by allowing wife to assist with pill pack administration  Please see past updates related to this goal by clicking on the "Past Updates" button in the selected goal       The patient verbalized understanding of instructions provided today and declined a print copy of patient instruction materials.   Telephone follow up appointment with care management team member scheduled for:10/07/19  ABarb Merino RN,  BSN, CCM Care Management Coordinator [facility]  Direct Phone: [phone]
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
## 2019-09-18 ENCOUNTER — Other Ambulatory Visit: Payer: Self-pay

## 2019-09-18 ENCOUNTER — Ambulatory Visit: Payer: Medicare HMO | Admitting: Neurology

## 2019-09-19 ENCOUNTER — Ambulatory Visit (INDEPENDENT_AMBULATORY_CARE_PROVIDER_SITE_OTHER): Payer: Medicare HMO | Admitting: Internal Medicine

## 2019-09-19 ENCOUNTER — Encounter: Payer: Self-pay | Admitting: Internal Medicine

## 2019-09-19 VITALS — BP 130/78 | HR 86 | Temp 98.8°F | Ht 67.8 in | Wt 188.8 lb

## 2019-09-19 DIAGNOSIS — I1310 Hypertensive heart and chronic kidney disease without heart failure, with stage 1 through stage 4 chronic kidney disease, or unspecified chronic kidney disease: Secondary | ICD-10-CM | POA: Diagnosis not present

## 2019-09-19 DIAGNOSIS — E1122 Type 2 diabetes mellitus with diabetic chronic kidney disease: Secondary | ICD-10-CM

## 2019-09-19 DIAGNOSIS — E663 Overweight: Secondary | ICD-10-CM

## 2019-09-19 DIAGNOSIS — E1121 Type 2 diabetes mellitus with diabetic nephropathy: Secondary | ICD-10-CM

## 2019-09-19 DIAGNOSIS — Z9114 Patient's other noncompliance with medication regimen: Secondary | ICD-10-CM

## 2019-09-19 DIAGNOSIS — M1A30X0 Chronic gout due to renal impairment, unspecified site, without tophus (tophi): Secondary | ICD-10-CM

## 2019-09-19 DIAGNOSIS — N1831 Chronic kidney disease, stage 3a: Secondary | ICD-10-CM | POA: Diagnosis not present

## 2019-09-19 DIAGNOSIS — Z6828 Body mass index (BMI) 28.0-28.9, adult: Secondary | ICD-10-CM

## 2019-09-20 LAB — HEMOGLOBIN A1C
Est. average glucose Bld gHb Est-mCnc: 255 mg/dL
Hgb A1c MFr Bld: 10.5 % — ABNORMAL HIGH (ref 4.8–5.6)

## 2019-09-20 LAB — URIC ACID: Uric Acid: 6.6 mg/dL (ref 3.7–8.6)

## 2019-09-22 NOTE — Progress Notes (Signed)
Subjective:     Patient ID: Dylan [name]. , male    DOB: [birth year] , 78 y.o.   MRN: [id number]   Chief Complaint  Patient presents with  . Diabetes  . Hypertension    HPI  He presents today for DM check. He is accompanied by his wife, who states he is non-compliant with both meds and diet.   Diabetes He presents for his follow-up diabetic visit. He has type 2 diabetes mellitus. There are no hypoglycemic associated symptoms. Pertinent negatives for diabetes include no blurred vision and no chest pain. There are no hypoglycemic complications. Diabetic complications include nephropathy. Risk factors for coronary artery disease include diabetes mellitus, dyslipidemia, hypertension, sedentary lifestyle, male sex and obesity. He is compliant with treatment some of the time. He is following a diabetic diet. He participates in exercise intermittently. His breakfast blood glucose is taken between 9-10 am. His breakfast blood glucose range is generally 140-180 mg/dl. An ACE inhibitor/angiotensin II receptor blocker is being taken. He does not see a podiatrist.Eye exam is current.  Hypertension This is a chronic problem. The current episode started more than 1 year ago. The problem has been gradually improving since onset. The problem is controlled. Pertinent negatives include no blurred vision or chest pain. Risk factors for coronary artery disease include diabetes mellitus, dyslipidemia, male gender, obesity and sedentary lifestyle. There is no history of sleep apnea.     Past Medical History:  Diagnosis Date  . Anxiety   . Aortic stenosis    mild AS 11/24/16 (peak grad 24, mean grad 11) Dr. [name]  . CHB (complete heart block) ([place]) 07/2017  . 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 Diabetes mellitus without complication ([place])   . Gout   . Hyperlipemia   . Hypertension   . 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 Memory loss   . Peripheral arterial disease ([place])    . 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 Second degree AV block    Wenckebach; no indication for pacemaker as of 12/01/16 (Dr. [name])  . Vitamin B12 deficiency anemia 03/01/2018  . Vitamin D deficiency disease      Family History  Problem Relation Age of Onset  . Diabetes Mother   . Breast cancer Mother   . Arthritis Mother   . Hypertension Father   . Diabetes Sister   . Diabetes Brother   . Diabetes Maternal Grandmother   . Diabetes Brother   . Diabetes Brother   . Diabetes Brother   . Diabetes Sister   . Diabetes Sister   . Diabetes Sister      Current Outpatient Medications:  .  allopurinol (ZYLOPRIM) 100 MG tablet, TAKE 1 TABLET BY MOUTH EVERY DAY, Disp: 90 tablet, Rfl: 1 .  amLODipine (NORVASC) 5 MG tablet, TAKE 1 TABLET BY MOUTH DAILY, Disp: 30 tablet, Rfl: 5 .  Ascorbic Acid (VITAMIN C) 1000 MG tablet, Take 1,000 mg by mouth daily., Disp: , Rfl:  .  aspirin 81 MG tablet, Take 81 mg by mouth 3 (three) times a week. , Disp: , Rfl:  .  carvedilol (COREG) 12.5 MG tablet, Take 1 tablet (12.5 mg total) by mouth 2 (two) times daily., Disp: 180 tablet, Rfl: 3 .  Cholecalciferol (VITAMIN D3) 5000 units CAPS, Take 5,000 Units by mouth every evening., Disp: , Rfl:  .  Coenzyme Q10 200 MG capsule, Take 200 mg by mouth daily., Disp: , Rfl:  .  COLCRYS 0.6  MG tablet, Take 1 tablet (0.6 mg total) by mouth daily. (Patient taking differently: Take 0.6 mg by mouth as needed. ), Disp: 90 tablet, Rfl: 1 .  cyclobenzaprine (FLEXERIL) 5 MG tablet, Take 5-10 mg by mouth at bedtime as needed for muscle spasms., Disp: , Rfl:  .  glucose blood test strip, 1 each by Other route as needed for other. Use as instructed, Disp: , Rfl:  .  memantine (NAMENDA) 10 MG tablet, Take 1 tablet (10 mg total) by mouth 2 (two) times daily., Disp: 180 tablet, Rfl: 4 .  metFORMIN (GLUCOPHAGE) 1000 MG tablet, Take one tablet by mouth daily., Disp: 90 tablet, Rfl: 0 .  methocarbamol (ROBAXIN) 500 MG  tablet, Take 500 mg by mouth daily as needed for muscle spasms., Disp: , Rfl:  .  pravastatin (PRAVACHOL) 20 MG tablet, TAKE 1 TABLET BY ORAL ROUTE EVERY DAY, Disp: 30 tablet, Rfl: 2 .  TRULICITY 1.5 TK/1.6WF SOPN, INJECT 1.5 MG INTO THE SKIN ONCE A WEEK., Disp: 12 pen, Rfl: 1 .  valsartan (DIOVAN) 320 MG tablet, TAKE 1 TABLET BY MOUTH EVERY DAY, Disp: 30 tablet, Rfl: 8   No Known Allergies   Review of Systems  Constitutional: Negative.   Eyes: Negative for blurred vision.  Respiratory: Negative.   Cardiovascular: Negative.  Negative for chest pain.  Gastrointestinal: Negative.   Neurological: Negative.   Psychiatric/Behavioral: Negative.      Today's Vitals   09/19/19 1530  BP: 130/78  Pulse: 86  Temp: 98.8 F (37.1 C)  TempSrc: Oral  Weight: 188 lb 12.8 oz (85.6 kg)  Height: 5' 7.8" (1.722 m)  PainSc: 0-No pain   Body mass index is 28.88 kg/m.   Objective:  Physical Exam Vitals signs and nursing note reviewed.  Constitutional:      Appearance: Normal appearance. He is obese.  Cardiovascular:     Rate and Rhythm: Normal rate and regular rhythm.     Heart sounds: Normal heart sounds.  Pulmonary:     Effort: Pulmonary effort is normal.     Breath sounds: Normal breath sounds.  Skin:    General: Skin is warm.  Neurological:     General: No focal deficit present.     Mental Status: He is alert.  Psychiatric:        Mood and Affect: Mood normal.         Assessment And Plan:     1. Type 2 diabetes mellitus with stage 3a chronic kidney disease, without long-term current use of insulin ([place])  I suspect his a1c will be elevated, given his non-compliance. He does agree to [facility] nursing evaluation for medication management.   - Hemoglobin A1c  2. Hypertensive heart and renal disease with renal failure, stage 1 through stage 4 or unspecified chronic kidney disease, without heart failure  Chronic, currently controlled. He will continue with current meds. He is  encouraged to avoid adding salt to his foods.   3. Chronic gout due to renal impairment without tophus, unspecified site  Chronic, he is currently asymptomatic. I will check uric acid level.   - Uric acid  4. Overweight with body mass index (BMI) of 28 to 28.9 in adult  He is encouraged to restrain from eating desserts, except for once weekly. He is encouraged to see this as a treat, instead of a daily indulgence. He is also encouraged to increase his activity level - he is advised to perform chair exercises while watching tv, and to walk to  his mailbox once daily.   Maximino Greenland, MD    THE PATIENT IS ENCOURAGED TO PRACTICE SOCIAL DISTANCING DUE TO THE COVID-19 PANDEMIC.

## 2019-09-23 ENCOUNTER — Telehealth: Payer: Self-pay

## 2019-09-23 LAB — CUP PACEART REMOTE DEVICE CHECK
Battery Remaining Longevity: 102 mo
Battery Remaining Percentage: 95.5 %
Battery Voltage: 2.99 V
Brady Statistic AP VP Percent: 6 %
Brady Statistic AP VS Percent: 1 %
Brady Statistic AS VP Percent: 94 %
Brady Statistic AS VS Percent: 1 %
Brady Statistic RA Percent Paced: 5.3 %
Brady Statistic RV Percent Paced: 99 %
Date Time Interrogation Session: 20201123020015
Implantable Lead Implant Date: 20180911
Implantable Lead Implant Date: 20180911
Implantable Lead Location: 753859
Implantable Lead Location: 753860
Implantable Pulse Generator Implant Date: 20180911
Lead Channel Impedance Value: 400 Ohm
Lead Channel Impedance Value: 440 Ohm
Lead Channel Pacing Threshold Amplitude: 0.5 V
Lead Channel Pacing Threshold Amplitude: 0.75 V
Lead Channel Pacing Threshold Pulse Width: 0.5 ms
Lead Channel Pacing Threshold Pulse Width: 0.5 ms
Lead Channel Sensing Intrinsic Amplitude: 4.9 mV
Lead Channel Sensing Intrinsic Amplitude: 5 mV
Lead Channel Setting Pacing Amplitude: 2 V
Lead Channel Setting Pacing Amplitude: 2.5 V
Lead Channel Setting Pacing Pulse Width: 0.5 ms
Lead Channel Setting Sensing Sensitivity: 4 mV
Pulse Gen Model: 2272
Pulse Gen Serial Number: 8940191

## 2019-09-23 NOTE — Telephone Encounter (Signed)
1st attempt to give lab results  

## 2019-09-23 NOTE — Telephone Encounter (Signed)
-----   Message from [name], MD sent at 09/21/2019  6:25 PM EST ----- Your hba1c is up to 10.5. It does not appear that you are taking your medications. Do you still agree to home health nurse coming out to help with your meds? If yes, we can try to handle this by Tuesday. Do they have preference of which company? Your gout levels are higher than what I would like. Please increase water intake. Cut back on meat intake. Cut back on sugar intake.
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
## 2019-09-24 ENCOUNTER — Encounter: Payer: Self-pay | Admitting: Neurology

## 2019-09-24 ENCOUNTER — Ambulatory Visit: Payer: Medicare HMO | Admitting: Neurology

## 2019-09-24 ENCOUNTER — Other Ambulatory Visit: Payer: Self-pay

## 2019-09-24 VITALS — BP 118/68 | HR 79 | Temp 97.4°F | Ht 68.0 in | Wt 190.8 lb

## 2019-09-24 DIAGNOSIS — F0391 Unspecified dementia with behavioral disturbance: Secondary | ICD-10-CM | POA: Diagnosis not present

## 2019-09-24 NOTE — Patient Instructions (Signed)
Continue taking Namenda. Try to increase activity, do brain exercises, such as puzzles, reading.

## 2019-09-25 ENCOUNTER — Ambulatory Visit (INDEPENDENT_AMBULATORY_CARE_PROVIDER_SITE_OTHER): Payer: Medicare HMO | Admitting: *Deleted

## 2019-09-25 ENCOUNTER — Ambulatory Visit: Payer: Medicare HMO | Admitting: Internal Medicine

## 2019-09-25 DIAGNOSIS — I442 Atrioventricular block, complete: Secondary | ICD-10-CM

## 2019-09-30 ENCOUNTER — Telehealth: Payer: Medicare HMO

## 2019-10-02 ENCOUNTER — Ambulatory Visit (INDEPENDENT_AMBULATORY_CARE_PROVIDER_SITE_OTHER): Payer: Medicare HMO

## 2019-10-02 DIAGNOSIS — E1121 Type 2 diabetes mellitus with diabetic nephropathy: Secondary | ICD-10-CM | POA: Diagnosis not present

## 2019-10-02 DIAGNOSIS — I10 Essential (primary) hypertension: Secondary | ICD-10-CM | POA: Diagnosis not present

## 2019-10-02 DIAGNOSIS — N182 Chronic kidney disease, stage 2 (mild): Secondary | ICD-10-CM | POA: Diagnosis not present

## 2019-10-02 DIAGNOSIS — N1831 Chronic kidney disease, stage 3a: Secondary | ICD-10-CM | POA: Diagnosis not present

## 2019-10-02 DIAGNOSIS — E1122 Type 2 diabetes mellitus with diabetic chronic kidney disease: Secondary | ICD-10-CM

## 2019-10-04 ENCOUNTER — Ambulatory Visit: Payer: Medicare HMO

## 2019-10-04 DIAGNOSIS — N1831 Chronic kidney disease, stage 3a: Secondary | ICD-10-CM

## 2019-10-04 NOTE — Chronic Care Management (AMB) (Signed)
Chronic Care Management   Follow Up Note   10/02/2019 Name: Dylan [name]. MRN: [id number] DOB: 1941/04/16  Referred by: [name], MD Reason for referral : Chronic Care Management (CCM RNCM Telephone [place] up )   Dylan Ina. is a 78 y.o. year old male who is a primary care patient of [name], MD. The CCM team was consulted for assistance with chronic disease management and care coordination needs.    Review of patient status, including review of consultants reports, relevant laboratory and other test results, and collaboration with appropriate care team members and the patient's provider was performed as part of comprehensive patient evaluation and provision of chronic care management services.    SDOH (Social Determinants of Health) screening performed today: None. See Care Plan for related entries.   Inbound call received from spouse Dylan [name].   Outpatient Encounter Medications as of 10/02/2019  Medication Sig Note  . allopurinol (ZYLOPRIM) 100 MG tablet TAKE 1 TABLET BY MOUTH EVERY DAY   . amLODipine (NORVASC) 5 MG tablet TAKE 1 TABLET BY MOUTH DAILY   . Ascorbic Acid (VITAMIN C) 1000 MG tablet Take 1,000 mg by mouth daily.   [name] aspirin 81 MG tablet Take 81 mg by mouth 3 (three) times a week.    . carvedilol (COREG) 12.5 MG tablet Take 1 tablet (12.5 mg total) by mouth 2 (two) times daily.   . Cholecalciferol (VITAMIN D3) 5000 units CAPS Take 5,000 Units by mouth every evening.   . Coenzyme Q10 200 MG capsule Take 200 mg by mouth daily.   [name] COLCRYS 0.6 MG tablet Take 1 tablet (0.6 mg total) by mouth daily. (Patient taking differently: Take 0.6 mg by mouth as needed. ) 12/12/2018: Takes as needed for gout flare.   . cyclobenzaprine (FLEXERIL) 5 MG tablet Take 5-10 mg by mouth at bedtime as needed for muscle spasms.   [name] glucose blood test strip 1 each by Other route as needed for other. Use as instructed   . memantine (NAMENDA) 10 MG tablet Take 1 tablet  (10 mg total) by mouth 2 (two) times daily.   . metFORMIN (GLUCOPHAGE) 1000 MG tablet Take one tablet by mouth daily.   . methocarbamol (ROBAXIN) 500 MG tablet Take 500 mg by mouth daily as needed for muscle spasms.   . pravastatin (PRAVACHOL) 20 MG tablet TAKE 1 TABLET BY ORAL ROUTE EVERY DAY   . TRULICITY 1.5 JK/9.3OI SOPN INJECT 1.5 MG INTO THE SKIN ONCE A WEEK.   . valsartan (DIOVAN) 320 MG tablet TAKE 1 TABLET BY MOUTH EVERY DAY    No facility-administered encounter medications on file as of 10/02/2019.      Goals Addressed    . "He's eating too many sweets"   On track    Spouse stated Current Barriers:  [name] Knowledge Deficits related to diabetes Meal planning using the plate method and portion control  Nurse Case Manager Clinical Goal(s):  [name] Over the next 90 days, patient will work with the CCM team to address needs related to diabetes Self Health management specifically related to nutritional recommendations  CCM RN CM Interventions:  10/02/19 call completed with spouse Dylan [name]   . Evaluation of current treatment plan related to diabetes and patient's adherence to plan as established by provider. . Provided education to patient re: importance of diabetic dietary recommendations including adherence to following a low carb diet using Meal Planning with the plate method and portion control; discussed patient's recent A1C increased  to 8.3; education provided related to potential health risks for uncontrolled diabetes  . Reviewed medications with patient and discussed patient is adhering to taking his weekly injection of Trulicity and patient is self injecting; discussed spouse Dylan [name] is not administering all medications to patient and is supervising him as he takes the meds  . Discussed plans with patient for ongoing care management follow up and provided patient with direct contact information for care management team . Advised patient, providing education and rationale, to check cbg  daily before meals and record, calling the CCM team and or PCP for findings outside established parameters.  <80 and or >250 . Discussed Dylan [name] has not been Self monitoring his CBG's; discussed Dylan [name] is unfamiliar with how to use a glucometer . Discussed [place] services are pending and Dylan [name] was encouraged to have the [facility] nurse teach her how to monitor the patient's CBG's - she agreed . Discussed Dylan [name] is waking Dylan [name] up in the am for breakfast and is working on having him eat three good meals per day and this has improved his sleep schedule, and his mood . Positive reinforcement given to Dylan [name] for taking great care of Dylan [name] and trying to be proactive in his care . Discussed she has noticed when its meal time and he has not eaten his mood changes; she will begin to monitor for hypoglycemia once checking his CBG's when these episode occur and will report hypoglycemic events to the CCM team and to [name]   Patient Self Care Activities:  . Unable to independently perform self care  Please see past updates related to this goal by clicking on the "Past Updates" button in the selected goal          Telephone follow up appointment with care management team member scheduled for: 10/07/19  [name], RN, BSN, CCM Care Management Coordinator [facility] [place] [facility]  Direct Phone: [phone]

## 2019-10-04 NOTE — Chronic Care Management (AMB) (Signed)
Chronic Care Management    Social Work Follow Up Note  10/04/2019 Name: Dylan [name]. MRN: [id number] DOB: 10-Dec-1940  Dylan [name]. is a 78 y.o. year old male who is a primary care patient of [name], MD. The CCM team was consulted for assistance with care coordination.   Review of patient status, including review of consultants reports, other relevant assessments, and collaboration with appropriate care team members and the patient's provider was performed as part of comprehensive patient evaluation and provision of chronic care management services.    SW placed an outbound call to the patients spouse and caregiver Dylan [name] to assist with care coordination of patient care management needs.  Outpatient Encounter Medications as of 10/04/2019  Medication Sig Note  . allopurinol (ZYLOPRIM) 100 MG tablet TAKE 1 TABLET BY MOUTH EVERY DAY   . amLODipine (NORVASC) 5 MG tablet TAKE 1 TABLET BY MOUTH DAILY   . Ascorbic Acid (VITAMIN C) 1000 MG tablet Take 1,000 mg by mouth daily.   [name] aspirin 81 MG tablet Take 81 mg by mouth 3 (three) times a week.    . carvedilol (COREG) 12.5 MG tablet Take 1 tablet (12.5 mg total) by mouth 2 (two) times daily.   . Cholecalciferol (VITAMIN D3) 5000 units CAPS Take 5,000 Units by mouth every evening.   . Coenzyme Q10 200 MG capsule Take 200 mg by mouth daily.   [name] COLCRYS 0.6 MG tablet Take 1 tablet (0.6 mg total) by mouth daily. (Patient taking differently: Take 0.6 mg by mouth as needed. ) 12/12/2018: Takes as needed for gout flare.   . cyclobenzaprine (FLEXERIL) 5 MG tablet Take 5-10 mg by mouth at bedtime as needed for muscle spasms.   [name] glucose blood test strip 1 each by Other route as needed for other. Use as instructed   . memantine (NAMENDA) 10 MG tablet Take 1 tablet (10 mg total) by mouth 2 (two) times daily.   . metFORMIN (GLUCOPHAGE) 1000 MG tablet Take one tablet by mouth daily.   . methocarbamol (ROBAXIN) 500 MG tablet Take 500 mg  by mouth daily as needed for muscle spasms.   . pravastatin (PRAVACHOL) 20 MG tablet TAKE 1 TABLET BY ORAL ROUTE EVERY DAY   . TRULICITY 1.5 ZW/2.5EN SOPN INJECT 1.5 MG INTO THE SKIN ONCE A WEEK.   . valsartan (DIOVAN) 320 MG tablet TAKE 1 TABLET BY MOUTH EVERY DAY    No facility-administered encounter medications on file as of 10/04/2019.      Goals Addressed            This Visit's Progress   . "He's eating too many sweets"       Spouse stated Current Barriers:  [name] Knowledge Deficits related to diabetes Meal planning using the plate method and portion control  Nurse Case Manager Clinical Goal(s):  [name] Over the next 90 days, patient will work with the CCM team to address needs related to diabetes Self Health management specifically related to nutritional recommendations  CCM SW Interventions: Completed 10/04/2019 with Dylan [name] . Outbound call to Mrs Arsenio Loader to review patient care coordination needs . Performed chart review to note most recent A1C up to 10.5  . Discussed current interventions within the home to assist with better disease management o Mrs Arsenio Loader reports assisting with medication management by administering PO medications to the patient during meal times o The patient is actively participating in daily exercise by riding a stationary bike within the home o Home  Health RN to initiate services on 10/05/2019 to assist with medication management . Assessed for current intervention success by requesting recent CBG readings o Determined the patient has not been tracking glucose levels stating to Mrs Arsenio Loader his meter is not working properly "I spoke with Glenard Haring this week and she asked me to follow up on that. I meant to call her and tell her he needs a new one" o Advised Mrs Colbert Ewing would collaborate with the patients primary provider to request a new meter be ordered o Discussed importance of glucose monitoring as directed by the patients healthcare team and recording  readings in a log to share during [place] appointments o Collaboration with RN Case Manager regarding today's intervention  Patient Self Care Activities:  . Unable to independently perform self care  Please see past updates related to this goal by clicking on the "Past Updates" button in the selected goal          Follow Up Plan: SW will follow up with patient by phone over the next 6 weeks   Daneen Schick, BSW, CDP Social Worker, Certified Dementia Practitioner [place] [facility] [place] [facility] [phone]  Total time spent performing care coordination and/or care management activities with the patient by phone or face to face = 14 minutes.

## 2019-10-04 NOTE — Patient Instructions (Signed)
Visit Information  Goals Addressed    . "He's eating too many sweets"   On track    Spouse stated Current Barriers:  [name] Knowledge Deficits related to diabetes Meal planning using the plate method and portion control  Nurse Case Manager Clinical Goal(s):  [name] Over the next 90 days, patient will work with the CCM team to address needs related to diabetes Self Health management specifically related to nutritional recommendations  CCM RN CM Interventions:  10/02/19 call completed with spouse [name]   . Evaluation of current treatment plan related to diabetes and patient's adherence to plan as established by provider. . Provided education to patient re: importance of diabetic dietary recommendations including adherence to following a low carb diet using Meal Planning with the plate method and portion control; discussed patient's recent A1C increased to 8.3; education provided related to potential health risks for uncontrolled diabetes  . Reviewed medications with patient and discussed patient is adhering to taking his weekly injection of Trulicity and patient is self injecting; discussed spouse [name] is not administering all medications to patient and is supervising him as he takes the meds  . Discussed plans with patient for ongoing care management follow up and provided patient with direct contact information for care management team . Advised patient, providing education and rationale, to check cbg daily before meals and record, calling the CCM team and or PCP for findings outside established parameters.  <80 and or >250 . Discussed Mr. Woolum has not been Self monitoring his CBG's; discussed [name] is unfamiliar with how to use a glucometer . Discussed [place] services are pending and [name] was encouraged to have the [facility] nurse teach her how to monitor the patient's CBG's - she agreed . Discussed [name] is waking Mr. Kratochvil up in the am for breakfast and is working on having him  eat three good meals per day and this has improved his sleep schedule, and his mood . Positive reinforcement given to [name] for taking great care of Mr. Lowdermilk and trying to be proactive in his care . Discussed she has noticed when its meal time and he has not eaten his mood changes; she will begin to monitor for hypoglycemia once checking his CBG's when these episode occur and will report hypoglycemic events to the CCM team and to [name]   Patient Self Care Activities:  . Unable to independently perform self care  Please see past updates related to this goal by clicking on the "Past Updates" button in the selected goal         The patient verbalized understanding of instructions provided today and declined a print copy of patient instruction materials.   Telephone follow up appointment with care management team member scheduled for: 10/07/19  [name], RN, BSN, CCM Care Management Coordinator [place] [facility]  Direct Phone: [phone]

## 2019-10-04 NOTE — Patient Instructions (Signed)
Social Worker Visit Information  Goals we discussed today:  Goals Addressed            This Visit's Progress   . "He's eating too many sweets"       Spouse stated Current Barriers:  [name] Knowledge Deficits related to diabetes Meal planning using the plate method and portion control  Nurse Case Manager Clinical Goal(s):  [name] Over the next 90 days, patient will work with the CCM team to address needs related to diabetes Self Health management specifically related to nutritional recommendations  CCM SW Interventions: Completed 10/04/2019 with [name] . Outbound call to Dylan [name] to review patient care coordination needs . Performed chart review to note most recent A1C up to 10.5  . Discussed current interventions within the home to assist with better disease management o Dylan [name] reports assisting with medication management by administering PO medications to the patient during meal times o The patient is actively participating in daily exercise by riding a stationary bike within the home o [place] RN to initiate services on 10/05/2019 to assist with medication management . Assessed for current intervention success by requesting recent CBG readings o Determined the patient has not been tracking glucose levels stating to Dylan [name] his meter is not working properly "I spoke with Dylan [name] this week and she asked me to follow up on that. I meant to call her and tell her he needs a new one" o Advised Dylan [name] would collaborate with the patients primary provider to request a new meter be ordered o Discussed importance of glucose monitoring as directed by the patients healthcare team and recording readings in a log to share during [place] appointments o Collaboration with RN Case Manager regarding today's intervention  Patient Self Care Activities:  . Unable to independently perform self care  Please see past updates related to this goal by clicking on the "Past Updates" button  in the selected goal          Follow Up Plan: SW will follow up with patient by phone over the next 6 weeks.  Dylan [name], BSW, CDP Social Worker, Certified Dementia Practitioner [place] [facility] [place] [facility] [phone]

## 2019-10-05 DIAGNOSIS — D519 Vitamin B12 deficiency anemia, unspecified: Secondary | ICD-10-CM

## 2019-10-05 DIAGNOSIS — Z7982 Long term (current) use of aspirin: Secondary | ICD-10-CM

## 2019-10-05 DIAGNOSIS — I350 Nonrheumatic aortic (valve) stenosis: Secondary | ICD-10-CM

## 2019-10-05 DIAGNOSIS — I1310 Hypertensive heart and chronic kidney disease without heart failure, with stage 1 through stage 4 chronic kidney disease, or unspecified chronic kidney disease: Secondary | ICD-10-CM

## 2019-10-05 DIAGNOSIS — E785 Hyperlipidemia, unspecified: Secondary | ICD-10-CM

## 2019-10-05 DIAGNOSIS — F0391 Unspecified dementia with behavioral disturbance: Secondary | ICD-10-CM

## 2019-10-05 DIAGNOSIS — E559 Vitamin D deficiency, unspecified: Secondary | ICD-10-CM

## 2019-10-05 DIAGNOSIS — E1151 Type 2 diabetes mellitus with diabetic peripheral angiopathy without gangrene: Secondary | ICD-10-CM

## 2019-10-05 DIAGNOSIS — F419 Anxiety disorder, unspecified: Secondary | ICD-10-CM

## 2019-10-05 DIAGNOSIS — I442 Atrioventricular block, complete: Secondary | ICD-10-CM

## 2019-10-05 DIAGNOSIS — N1831 Chronic kidney disease, stage 3a: Secondary | ICD-10-CM

## 2019-10-05 DIAGNOSIS — I700 Atherosclerosis of aorta: Secondary | ICD-10-CM

## 2019-10-05 DIAGNOSIS — E1122 Type 2 diabetes mellitus with diabetic chronic kidney disease: Secondary | ICD-10-CM

## 2019-10-05 DIAGNOSIS — M1030 Gout due to renal impairment, unspecified site: Secondary | ICD-10-CM

## 2019-10-05 DIAGNOSIS — J8410 Pulmonary fibrosis, unspecified: Secondary | ICD-10-CM

## 2019-10-07 ENCOUNTER — Telehealth: Payer: Medicare HMO

## 2019-10-07 ENCOUNTER — Ambulatory Visit: Payer: Self-pay

## 2019-10-07 DIAGNOSIS — I10 Essential (primary) hypertension: Secondary | ICD-10-CM

## 2019-10-07 DIAGNOSIS — N1831 Chronic kidney disease, stage 3a: Secondary | ICD-10-CM

## 2019-10-07 DIAGNOSIS — N183 Chronic kidney disease, stage 3 unspecified: Secondary | ICD-10-CM

## 2019-10-08 ENCOUNTER — Ambulatory Visit: Payer: Self-pay

## 2019-10-08 ENCOUNTER — Telehealth: Payer: Self-pay

## 2019-10-08 ENCOUNTER — Other Ambulatory Visit: Payer: Self-pay

## 2019-10-08 ENCOUNTER — Ambulatory Visit: Payer: Self-pay | Admitting: Pharmacist

## 2019-10-08 DIAGNOSIS — I10 Essential (primary) hypertension: Secondary | ICD-10-CM

## 2019-10-08 DIAGNOSIS — F0391 Unspecified dementia with behavioral disturbance: Secondary | ICD-10-CM

## 2019-10-08 DIAGNOSIS — N183 Chronic kidney disease, stage 3 unspecified: Secondary | ICD-10-CM

## 2019-10-08 DIAGNOSIS — E1121 Type 2 diabetes mellitus with diabetic nephropathy: Secondary | ICD-10-CM | POA: Diagnosis not present

## 2019-10-08 DIAGNOSIS — E1122 Type 2 diabetes mellitus with diabetic chronic kidney disease: Secondary | ICD-10-CM

## 2019-10-08 DIAGNOSIS — N1831 Chronic kidney disease, stage 3a: Secondary | ICD-10-CM

## 2019-10-08 MED ORDER — ONETOUCH VERIO VI STRP: ORAL_STRIP | 3 refills | Status: DC

## 2019-10-08 MED ORDER — ONETOUCH DELICA LANCETS 33G MISC: 3 refills | Status: DC

## 2019-10-08 NOTE — Telephone Encounter (Signed)
-----   Message from [name], MD sent at 10/04/2019  1:45 PM EST ----- Regarding: FW: needs glucometer ordered Can you advise him to check sugars every morning upon awakening? They can also come next week to pick up meter if we have some ----- Message ----- From: Daneen Schick Sent: 10/04/2019  10:45 AM EST To: [name], MD, Lynne Logan, RN Subject: needs glucometer ordered                       Dr Baird Cancer,   I spoke with [name] this morning and she reports [facility] RN planned initial visit for Saturday 12/5. She stated Mr Monestime is taking medications during meals and riding an exercise back each day. She feels he is doing a lot better. She stated she spoke with Glenard Haring earlier this week and had homework to inquire about CBG readings. Mr Thornhill reports to [name] he is no longer checking his blood sugar due to the glucometer not working accurately. I encouraged [name] to have [facility] RN go over how to check readings as [name] is unsure. I let her know I would reach out to you to request a new machine be ordered. Also, [name] is unsure how often he should be recording blood glucose readings. Can you have a [name] contact her Monday to review frequency?  Thank you, Tillie Rung

## 2019-10-08 NOTE — Telephone Encounter (Signed)
The pt was notified to check his blood sugars every morning when he first wakes up and to come pickup a glucometer from the office.

## 2019-10-08 NOTE — Chronic Care Management (AMB) (Addendum)
  Chronic Care Management   Outreach Note  10/07/2019 Name: Dylan [name]. MRN: [id number] DOB: 08/24/1941  Referred by: [name], MD Reason for referral : Chronic Care Management (CCM RNCM Inbound Call )   Inbound call received from spouse [name] with a vm left requesting a return call. An unsuccessful telephone outreach was attempted today. The patient was referred to the case management team by [name] MD for assistance with care management and care coordination.   Follow Up Plan: Telephone follow up appointment with care management team member scheduled for: 10/08/19  [name], RN, BSN, CCM Care Management Coordinator [place] [facility]  Direct Phone: [phone]

## 2019-10-09 ENCOUNTER — Ambulatory Visit: Payer: Self-pay | Admitting: Pharmacist

## 2019-10-09 ENCOUNTER — Ambulatory Visit: Payer: Self-pay

## 2019-10-09 DIAGNOSIS — N1831 Chronic kidney disease, stage 3a: Secondary | ICD-10-CM

## 2019-10-09 DIAGNOSIS — N183 Chronic kidney disease, stage 3 unspecified: Secondary | ICD-10-CM | POA: Diagnosis not present

## 2019-10-09 DIAGNOSIS — I10 Essential (primary) hypertension: Secondary | ICD-10-CM

## 2019-10-09 DIAGNOSIS — E1121 Type 2 diabetes mellitus with diabetic nephropathy: Secondary | ICD-10-CM | POA: Diagnosis not present

## 2019-10-09 DIAGNOSIS — E1122 Type 2 diabetes mellitus with diabetic chronic kidney disease: Secondary | ICD-10-CM

## 2019-10-09 NOTE — Chronic Care Management (AMB) (Signed)
Chronic Care Management   Follow Up Note   10/09/2019 Name: Dylan [name]. MRN: [id number] DOB: Mar 01, 1941  Referred by: [name], MD Reason for referral : Chronic Care Management (CCM RNCM Telephone Inbound call )   Dylan [name]. is a 78 y.o. year old male who is a primary care patient of [name], MD. The CCM team was consulted for assistance with chronic disease management and care coordination needs.    Review of patient status, including review of consultants reports, relevant laboratory and other test results, and collaboration with appropriate care team members and the patient's provider was performed as part of comprehensive patient evaluation and provision of chronic care management services.    SDOH (Social Determinants of Health) screening performed today: None. See Care Plan for related entries.   Inbound call received from spouse Dylan [name] concerning patient's glucometer.   Outpatient Encounter Medications as of 10/08/2019  Medication Sig Note  . allopurinol (ZYLOPRIM) 100 MG tablet TAKE 1 TABLET BY MOUTH EVERY DAY   . amLODipine (NORVASC) 5 MG tablet TAKE 1 TABLET BY MOUTH DAILY   . Ascorbic Acid (VITAMIN C) 1000 MG tablet Take 1,000 mg by mouth daily.   [name] aspirin 81 MG tablet Take 81 mg by mouth 3 (three) times a week.    . carvedilol (COREG) 12.5 MG tablet Take 1 tablet (12.5 mg total) by mouth 2 (two) times daily.   . Cholecalciferol (VITAMIN D3) 5000 units CAPS Take 5,000 Units by mouth every evening.   . Coenzyme Q10 200 MG capsule Take 200 mg by mouth daily.   [name] COLCRYS 0.6 MG tablet Take 1 tablet (0.6 mg total) by mouth daily. (Patient taking differently: Take 0.6 mg by mouth as needed. ) 12/12/2018: Takes as needed for gout flare.   . cyclobenzaprine (FLEXERIL) 5 MG tablet Take 5-10 mg by mouth at bedtime as needed for muscle spasms.   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  memantine (NAMENDA) 10 MG tablet Take 1 tablet (10 mg total) by mouth 2 (two) times daily.   . metFORMIN (GLUCOPHAGE) 1000 MG tablet Take one tablet by mouth daily.   . methocarbamol (ROBAXIN) 500 MG tablet Take 500 mg by mouth daily as needed for muscle spasms.   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 pravastatin (PRAVACHOL) 20 MG tablet TAKE 1 TABLET BY ORAL ROUTE EVERY DAY   . TRULICITY 1.5 AT/5.5DD SOPN INJECT 1.5 MG INTO THE SKIN ONCE A WEEK.   . valsartan (DIOVAN) 320 MG tablet TAKE 1 TABLET BY MOUTH EVERY DAY    No facility-administered encounter medications on file as of 10/08/2019.      Goals Addressed    . I would like to continue to optimize his medication management of my chronic conditions.       Spouse stated Current Barriers:  . Polypharmacy; complex patient with multiple comorbidities including dementia, diabetes, hypertension . Wife assists with managing medications via [employer] pill packaging system.  Patient continues to take pills out and sometimes does not take them.  Pharmacist Clinical Goal(s):  [name] Over the next 90 days, patient will work with PharmD and provider towards optimized medication management  Interventions: 09/02/19 completed call with patient's spouse . Comprehensive medication review performed; medication list updated in electronic medical record . Counseled wife on working to oversee pill pack administration  as patient takes pills out at times and forgets to take them.  She continues to give patient the Namenda twice daily.  She has not seen a great benefit from this. . Reviewed & discussed the following diabetes-related information with patient: o Continue checking blood sugars as directed o Follow ADA recommended "diabetes-friendly" diet  (reviewed healthy snack/food options); Patient continues to eat large amounts of desserts.  Wife states she is trying to limit, however patient is headstrong at times.  Discussed with her the likelihood of having to add more antidiabetes medications. o Confirmed current DM regimen: Metformin once daily, Trulicity weekly (actos d/c'd due to edema/CKD) o Discussed GLP-1 injection technique.  Patient is self-administering per wife. o Reviewed medication purpose/side effects-->wife denies adverse events, denies hypoglycemia o Most recent A1c is 8.2% on 04/23/19 (increase from 7.5% on 01/21/19).  Wife working to give medications as prescribed, but receives resistance from patient as he is battling with dementia.  They are using compliance packaging by [employer] mail order.   o Current anti-hypertensive regimen: carvedilol, amlodipine, valsartan o Current anti-hyperlipidemia regimen: pravastatin 20mg  daily o Continue taking all medications as prescribed by provider o Patient now has all medications in pill packs as ordered at last CCM PharmD visit o Next PCP OV 09/19/19  CCM RN CM Interventions:  10/08/19 completed call with spouse Dylan [name]  Inbound call received from spouse Dylan [name] . Evaluation of current treatment plan related to Diabetes and patient's adherence to plan as established by provider. . Advised patient to stop by the office to pick up his new glucometer; discussed the [facility] nurse will work with Dylan [name] and Dylan [name] on how to properly use the glucometer; discussed the embedded Pharm D Lottie Dawson is also available if needed and can scheduled a face to face OV if further instruction is needed . Discussed plans with patient for ongoing care management follow up and provided patient with direct contact information for care management team . Advised patient, providing education and rationale, to check cbg daily before meals and record, calling the CCM team and or PCP for findings outside established parameters.   . Discussed Dylan [name] is riding his exercise bike daily in the evenings and is feeling better overall . Discussed Dylan [name] is  waking him earlier to ensure he is checking his CBG, eating breakfast and taking his am medications . Discussed meal preparation and dinner time is now earlier in the evening - educated Dylan [name] on the importance of closely monitoring Mr. [place] CBG's at home due to adding exercise to his daily routine can lower his CBG's - educated Dylan [name] on measures to take for hypoglycemia  . Mailed printed signs/symptoms hypo/hyperglycemia reference sheet  Patient Self Care Activities:  . Patient will take medications as prescribed with help from wife and pill packaging system . Patient will focus on improved adherence by allowing wife to assist with pill pack administration  Please see past updates related to this goal by clicking on the "Past Updates" button in the selected goal          Telephone follow up appointment with care management team member scheduled for: 10/15/19  [name], RN, BSN, CCM Care Management Coordinator [place] [facility]  Direct Phone: [phone]

## 2019-10-09 NOTE — Patient Instructions (Signed)
Visit Information  Goals Addressed    . I would like to continue to optimize his medication management of my chronic conditions.       Spouse stated Current Barriers:  . Polypharmacy; complex patient with multiple comorbidities including dementia, diabetes, hypertension . Wife assists with managing medications via [employer] pill packaging system.  Patient continues to take pills out and sometimes does not take them.  Pharmacist Clinical Goal(s):  [name] Over the next 90 days, patient will work with PharmD and provider towards optimized medication management  Interventions: 09/02/19 completed call with patient's spouse . Comprehensive medication review performed; medication list updated in electronic medical record . Counseled wife on working to oversee pill pack administration as patient takes pills out at times and forgets to take them.  She continues to give patient the Namenda twice daily.  She has not seen a great benefit from this. . Reviewed & discussed the following diabetes-related information with patient: o Continue checking blood sugars as directed o Follow ADA recommended "diabetes-friendly" diet  (reviewed healthy snack/food options); Patient continues to eat large amounts of desserts.  Wife states she is trying to limit, however patient is headstrong at times. Discussed with her the likelihood of having to add more antidiabetes medications. o Confirmed current DM regimen: Metformin once daily, Trulicity weekly (actos d/c'd due to edema/CKD) o Discussed GLP-1 injection technique.  Patient is self-administering per wife. o Reviewed medication purpose/side effects-->wife denies adverse events, denies hypoglycemia o Most recent A1c is 8.2% on 04/23/19 (increase from 7.5% on 01/21/19).  Wife working to give medications as prescribed, but receives resistance from patient as he is battling with dementia.  They are using compliance packaging by [facility] [employer] mail order.   o Current anti-hypertensive  regimen: carvedilol, amlodipine, valsartan o Current anti-hyperlipidemia regimen: pravastatin 20mg  daily o Continue taking all medications as prescribed by provider o Patient now has all medications in pill packs as ordered at last CCM PharmD visit o Next PCP OV 09/19/19  CCM RN CM Interventions:  10/08/19 completed call with spouse Dylan [name]  Inbound call received from spouse Dylan [name] . Evaluation of current treatment plan related to Diabetes and patient's adherence to plan as established by provider. . Advised patient to stop by the office to pick up his new glucometer; discussed the [facility] nurse will work with Dylan [name] and Dylan [name] on how to properly use the glucometer; discussed the embedded Pharm D Dylan [name] is also available if needed and can scheduled a face to face OV if further instruction is needed . Discussed plans with patient for ongoing care management follow up and provided patient with direct contact information for care management team . Advised patient, providing education and rationale, to check cbg daily before meals and record, calling the CCM team and or PCP for findings outside established parameters.   . Discussed Dylan [name] is riding his exercise bike daily in the evenings and is feeling better overall . Discussed Dylan [name] is waking him earlier to ensure he is checking his CBG, eating breakfast and taking his am medications . Discussed meal preparation and dinner time is now earlier in the evening - educated Dylan [name] on the importance of closely monitoring Dylan [name] CBG's at home due to adding exercise to his daily routine can lower his CBG's - educated Dylan [name] on measures to take for hypoglycemia  . Mailed printed signs/symptoms hypo/hyperglycemia reference sheet  Patient Self Care Activities:  . Patient will take medications as prescribed  with help from wife and pill packaging system . Patient will focus on improved adherence by allowing wife to  assist with pill pack administration  Please see past updates related to this goal by clicking on the "Past Updates" button in the selected goal         The patient verbalized understanding of instructions provided today and declined a print copy of patient instruction materials.   Telephone follow up appointment with care management team member scheduled for: 10/15/19  Dylan Merino, RN, BSN, CCM Care Management Coordinator [place] [facility]  Direct Phone: [phone]

## 2019-10-10 ENCOUNTER — Ambulatory Visit: Payer: Medicare HMO | Admitting: Orthotics

## 2019-10-10 ENCOUNTER — Ambulatory Visit (INDEPENDENT_AMBULATORY_CARE_PROVIDER_SITE_OTHER): Payer: Medicare HMO | Admitting: Internal Medicine

## 2019-10-10 ENCOUNTER — Other Ambulatory Visit: Payer: Self-pay

## 2019-10-10 ENCOUNTER — Telehealth: Payer: Self-pay

## 2019-10-10 VITALS — BP 132/80 | HR 74 | Temp 98.3°F | Ht 68.0 in | Wt 194.2 lb

## 2019-10-10 DIAGNOSIS — M79675 Pain in left toe(s): Secondary | ICD-10-CM

## 2019-10-10 DIAGNOSIS — B351 Tinea unguium: Secondary | ICD-10-CM

## 2019-10-10 DIAGNOSIS — H9201 Otalgia, right ear: Secondary | ICD-10-CM | POA: Diagnosis not present

## 2019-10-10 DIAGNOSIS — R519 Headache, unspecified: Secondary | ICD-10-CM | POA: Diagnosis not present

## 2019-10-10 DIAGNOSIS — E1151 Type 2 diabetes mellitus with diabetic peripheral angiopathy without gangrene: Secondary | ICD-10-CM

## 2019-10-10 MED ORDER — BLOOD PRESSURE KIT: PACK | 0 refills | Status: DC

## 2019-10-10 MED ORDER — DICLOFENAC SODIUM 1 % EX GEL: 2.0000 g | Freq: Three times a day (TID) | CUTANEOUS | 0 refills | Status: AC

## 2019-10-10 NOTE — Telephone Encounter (Signed)
Wife called were you going to put in a prescription for a blood pressure machine? She said she was at the pharmacy and the prescription is not there

## 2019-10-10 NOTE — Progress Notes (Signed)
Exchanged shoes for a different type that seemed to fit better

## 2019-10-10 NOTE — Chronic Care Management (AMB) (Signed)
Chronic Care Management   Follow Up Note   10/09/2019 Name: Dylan [name]. MRN: [id number] DOB: 01/24/1941  Referred by: [name], MD Reason for referral : Chronic Care Management (CCM RNCM Telephone Follow up)   [name] is a 78 y.o. year old male who is a primary care patient of [name], MD. The CCM team was consulted for assistance with chronic disease management and care coordination needs.    Review of patient status, including review of consultants reports, relevant laboratory and other test results, and collaboration with appropriate care team members and the patient's provider was performed as part of comprehensive patient evaluation and provision of chronic care management services.    SDOH (Social Determinants of Health) screening performed today: None. See Care Plan for related entries.   Inbound call received from spouse [name] and patient today to report new onset of right ear pain with radiation to back of head when Mr. Swigert rides his exercise bike.   Outpatient Encounter Medications as of 10/09/2019  Medication Sig Note  . allopurinol (ZYLOPRIM) 100 MG tablet TAKE 1 TABLET BY MOUTH EVERY DAY   . amLODipine (NORVASC) 5 MG tablet TAKE 1 TABLET BY MOUTH DAILY   . Ascorbic Acid (VITAMIN C) 1000 MG tablet Take 1,000 mg by mouth daily.   [name] aspirin 81 MG tablet Take 81 mg by mouth 3 (three) times a week.    . carvedilol (COREG) 12.5 MG tablet Take 1 tablet (12.5 mg total) by mouth 2 (two) times daily.   . Cholecalciferol (VITAMIN D3) 5000 units CAPS Take 5,000 Units by mouth every evening.   . Coenzyme Q10 200 MG capsule Take 200 mg by mouth daily.   [name] COLCRYS 0.6 MG tablet Take 1 tablet (0.6 mg total) by mouth daily. (Patient taking differently: Take 0.6 mg by mouth as needed. ) 12/12/2018: Takes as needed for gout flare.   . cyclobenzaprine (FLEXERIL) 5 MG tablet Take 5-10 mg by mouth at bedtime as needed for muscle spasms.   [name] glucose blood  (ONETOUCH VERIO) test strip Use as instructed to check blood sugars daily dx: e11.22   . memantine (NAMENDA) 10 MG tablet Take 1 tablet (10 mg total) by mouth 2 (two) times daily.   . metFORMIN (GLUCOPHAGE) 1000 MG tablet Take one tablet by mouth daily.   . methocarbamol (ROBAXIN) 500 MG tablet Take 500 mg by mouth daily as needed for muscle spasms.   Glory Rosebush Delica Lancets 40J MISC Use as instructed to check blood sugars daily dx: e11.22   . pravastatin (PRAVACHOL) 20 MG tablet TAKE 1 TABLET BY ORAL ROUTE EVERY DAY   . TRULICITY 1.5 WJ/1.9JY SOPN INJECT 1.5 MG INTO THE SKIN ONCE A WEEK.   . valsartan (DIOVAN) 320 MG tablet TAKE 1 TABLET BY MOUTH EVERY DAY    No facility-administered encounter medications on file as of 10/09/2019.     Goals Addressed      Patient Stated   . "I am having right ear pain that radiates to the back of my head when I ride my exercise bike" (pt-stated)       Current Barriers:  [name] Knowledge Deficits related to evaluation and treatment of right ear and head pain   Nurse Case Manager Clinical Goal(s):  [name] Over the next 30 days, patient will work with PCP provider  to address needs related to diagnosis and treatment of right ear/head pain  CCM RN CM Interventions:  10/09/19 call completed with patient  and spouse   . Evaluation of current treatment plan related to right ear pain and headache and patient's adherence to plan as established by provider. . Advised patient to arrive at the [facility] office tomorrow, 10/10/19 @ 10:45 AM for evaluation and treatment of right ear pain and headache  . Collaborated with PCP [name] regarding patient's c/o right ear pain and headache when riding his exercise bike; advised the patient reports this problem started about 2 days ago and resolves with rest or lying his head down . Discussed plans with patient for ongoing care management follow up and provided patient with direct contact information for care management  team . Reviewed scheduled/upcoming provider appointments including: OV with provider Audery Amel PA-C set for 10/10/19 @10 :45 AM   Patient Self Care Activities:   . Unable to self administer medications as prescribed . Unable to perform ADLs independently . Unable to perform IADLs independently  Initial goal documentation         Telephone follow up appointment with care management team member scheduled for: 10/15/19   [name], RN, BSN, CCM Care Management Coordinator [place] [facility]  Direct Phone: [phone]

## 2019-10-10 NOTE — Progress Notes (Signed)
This visit occurred during the SARS-CoV-2 public health emergency.  Safety protocols were in place, including screening questions prior to the visit, additional usage of staff PPE, and extensive cleaning of exam room while observing appropriate contact time as indicated for disinfecting solutions.  Subjective:     Patient ID: Dylan [name]. , male    DOB: 1941/03/06 , 78 y.o.   MRN: [id number]   Chief Complaint  Patient presents with  . Headache    Right ear pain    HPI Onset of R ear pain 10 min after pedaling stationary machine x 2 weeks. His fiance states he is watching the TV straight ahead when doing this, and has not noticed him being in nay strange position. Denies decreased HEARING AND PAIN is behind his ear radiating to occipital region     Past Medical History:  Diagnosis Date  . Anxiety   . Aortic stenosis    mild AS 11/24/16 (peak grad 24, mean grad 11) Dr. [name]  . CHB (complete heart block) ([place]) 07/2017  . 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 Diabetes mellitus without complication ([place])   . Gout   . Hyperlipemia   . Hypertension   . 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 Memory loss   . Peripheral arterial disease ([place])   . 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 Second degree AV block    Wenckebach; no indication for pacemaker as of 12/01/16 (Dr. [name])  . Vitamin B12 deficiency anemia 03/01/2018  . Vitamin D deficiency disease      Family History  Problem Relation Age of Onset  . Diabetes Mother   . Breast cancer Mother   . Arthritis Mother   . Hypertension Father   . Diabetes Sister   . Diabetes Brother   . Diabetes Maternal Grandmother   . Diabetes Brother   . Diabetes Brother   . Diabetes Brother   . Diabetes Sister   . Diabetes Sister   . Diabetes Sister      Current Outpatient Medications:  .  allopurinol (ZYLOPRIM) 100 MG tablet, TAKE 1 TABLET BY MOUTH EVERY DAY, Disp: 90 tablet, Rfl: 1 .   amLODipine (NORVASC) 5 MG tablet, TAKE 1 TABLET BY MOUTH DAILY, Disp: 30 tablet, Rfl: 5 .  Ascorbic Acid (VITAMIN C) 1000 MG tablet, Take 1,000 mg by mouth daily., Disp: , Rfl:  .  aspirin 81 MG tablet, Take 81 mg by mouth 3 (three) times a week. , Disp: , Rfl:  .  carvedilol (COREG) 12.5 MG tablet, Take 1 tablet (12.5 mg total) by mouth 2 (two) times daily., Disp: 180 tablet, Rfl: 3 .  Cholecalciferol (VITAMIN D3) 5000 units CAPS, Take 5,000 Units by mouth every evening., Disp: , Rfl:  .  Coenzyme Q10 200 MG capsule, Take 200 mg by mouth daily., Disp: , Rfl:  .  COLCRYS 0.6 MG tablet, Take 1 tablet (0.6 mg total) by mouth daily. (Patient taking differently: Take 0.6 mg by mouth as needed. ), Disp: 90 tablet, Rfl: 1 .  cyclobenzaprine (FLEXERIL) 5 MG tablet, Take 5-10 mg by mouth at bedtime as needed for muscle spasms., Disp: , Rfl:  .  glucose blood (ONETOUCH VERIO) test strip, Use as instructed to check blood sugars daily dx: e11.22, Disp: 150 each, Rfl: 3 .  memantine (NAMENDA) 10 MG tablet, Take 1 tablet (10 mg total) by mouth 2 (two) times daily., Disp: 180 tablet, Rfl: 4 .  metFORMIN (GLUCOPHAGE) 1000  MG tablet, Take one tablet by mouth daily., Disp: 90 tablet, Rfl: 0 .  methocarbamol (ROBAXIN) 500 MG tablet, Take 500 mg by mouth daily as needed for muscle spasms., Disp: , Rfl:  .  OneTouch Delica Lancets 80D MISC, Use as instructed to check blood sugars daily dx: e11.22, Disp: 150 each, Rfl: 3 .  pravastatin (PRAVACHOL) 20 MG tablet, TAKE 1 TABLET BY ORAL ROUTE EVERY DAY, Disp: 30 tablet, Rfl: 2 .  TRULICITY 1.5 XI/3.3AS SOPN, INJECT 1.5 MG INTO THE SKIN ONCE A WEEK., Disp: 12 pen, Rfl: 1 .  valsartan (DIOVAN) 320 MG tablet, TAKE 1 TABLET BY MOUTH EVERY DAY, Disp: 30 tablet, Rfl: 8   No Known Allergies   Review of Systems  Denies ear drainage, decreased hearing, dizziness or light headness, swollen glands or fever.  Today's Vitals   10/10/19 1102  BP: 132/80  Pulse: 74  Temp: 98.3 F  (36.8 C)  TempSrc: Oral  SpO2: 95%  Weight: 194 lb 3.2 oz (88.1 kg)  Height: 5' 8" (1.727 m)   Body mass index is 29.53 kg/m.   Objective:  Physical Exam Vitals and nursing note reviewed.  Constitutional:      General: He is not in acute distress.    Appearance: He is not toxic-appearing.     Comments: Fiance is helping with history  HENT:     Head: Atraumatic.     Comments: Has tender R occipital scalp region and trap at the insertion. No rashes noted.  Neck:     Comments: ROM of neck shows stiffness and is decreased. Rotation to the L and anterior flexion provoked pain on R occipital region.  Cardiovascular:     Rate and Rhythm: Normal rate and regular rhythm.  Pulmonary:     Effort: Pulmonary effort is normal.     Breath sounds: Normal breath sounds.  Musculoskeletal:        General: Normal range of motion.  Neurological:     Mental Status: He is alert and oriented to person, place, and time.  Psychiatric:        Speech: Speech normal.        Behavior: Behavior normal.         Assessment And Plan:    1. Right ear pain- no disease found.    Asked to have BP checked before and after exercise and call me with results later today.   2. Occipital pain- acute. - diclofenac Sodium (VOLTAREN) 1 % GEL; Apply 2 g topically 3 (three) times daily.  Dispense: 150 g; Refill: 0  BP monitor kit sent to his pharmacy   [place], PA-C    THE PATIENT IS ENCOURAGED TO PRACTICE SOCIAL DISTANCING DUE TO THE COVID-19 PANDEMIC.

## 2019-10-10 NOTE — Patient Instructions (Signed)
Visit Information  Goals Addressed      Patient Stated   . "I am having right ear pain that radiates to the back of my head when I ride my exercise bike" (pt-stated)       Current Barriers:  [name] Knowledge Deficits related to evaluation and treatment of right ear and head pain   Nurse Case Manager Clinical Goal(s):  [name] Over the next 30 days, patient will work with PCP provider  to address needs related to diagnosis and treatment of right ear/head pain  CCM RN CM Interventions:  10/09/19 call completed with patient and spouse   . Evaluation of current treatment plan related to right ear pain and headache and patient's adherence to plan as established by provider. . Advised patient to arrive at the [facility] office tomorrow, 10/10/19 @ 10:45 AM for evaluation and treatment of right ear pain and headache  . Collaborated with PCP [name] regarding patient's c/o right ear pain and headache when riding his exercise bike; advised the patient reports this problem started about 2 days ago and resolves with rest or lying his head down . Discussed plans with patient for ongoing care management follow up and provided patient with direct contact information for care management team . Reviewed scheduled/upcoming provider appointments including: OV with provider Audery Amel PA-C set for 10/10/19 @10 :45 AM   Patient Self Care Activities:   . Unable to self administer medications as prescribed . Unable to perform ADLs independently . Unable to perform IADLs independently  Initial goal documentation        The patient verbalized understanding of instructions provided today and declined a print copy of patient instruction materials.   Telephone follow up appointment with care management team member scheduled for: 10/15/19  [name], RN, BSN, CCM Care Management Coordinator [place] [facility]  Direct Phone: [phone]

## 2019-10-10 NOTE — Patient Instructions (Addendum)
CHECK YOUR BLOOD PRESSURE BEFORE EXERCISING after medication, THEN AGAIN ONCE HE STARTS FEELING THE EAR PAIN. CALL ME WITH THOSE READING NEXT Wednesday.   Use heat on area of pain before using Voltaren up to three times a day for 15 minutes, then do stretches I taught you

## 2019-10-12 NOTE — Progress Notes (Signed)
Chronic Care Management   Visit Note  10/09/2019 Name: Dylan [name]. MRN: [id number] DOB: 02-09-1941  Referred by: [name], MD Reason for referral : Chronic Care Management   Dylan [name]. is a 78 y.o. year old male who is a primary care patient of [name], MD. 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     Medications: Outpatient Encounter Medications as of 10/09/2019  Medication Sig Note  . allopurinol (ZYLOPRIM) 100 MG tablet TAKE 1 TABLET BY MOUTH EVERY DAY   . amLODipine (NORVASC) 5 MG tablet TAKE 1 TABLET BY MOUTH DAILY   . Ascorbic Acid (VITAMIN C) 1000 MG tablet Take 1,000 mg by mouth daily.   [name] aspirin 81 MG tablet Take 81 mg by mouth 3 (three) times a week.    . carvedilol (COREG) 12.5 MG tablet Take 1 tablet (12.5 mg total) by mouth 2 (two) times daily.   . Cholecalciferol (VITAMIN D3) 5000 units CAPS Take 5,000 Units by mouth every evening.   . Coenzyme Q10 200 MG capsule Take 200 mg by mouth daily.   [name] COLCRYS 0.6 MG tablet Take 1 tablet (0.6 mg total) by mouth daily. (Patient taking differently: Take 0.6 mg by mouth as needed. ) 12/12/2018: Takes as needed for gout flare.   . cyclobenzaprine (FLEXERIL) 5 MG tablet Take 5-10 mg by mouth at bedtime as needed for muscle spasms.   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 memantine (NAMENDA) 10 MG tablet Take 1 tablet (10 mg total) by mouth 2 (two) times daily.   . metFORMIN (GLUCOPHAGE) 1000 MG tablet Take one tablet by mouth daily.   . methocarbamol (ROBAXIN)  500 MG tablet Take 500 mg by mouth daily as needed for muscle spasms.   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 pravastatin (PRAVACHOL) 20 MG tablet TAKE 1 TABLET BY ORAL ROUTE EVERY DAY   . TRULICITY 1.5 KG/4.0NU SOPN INJECT 1.5 MG INTO THE SKIN ONCE A WEEK.   . valsartan (DIOVAN) 320 MG tablet TAKE 1 TABLET BY MOUTH EVERY DAY    No facility-administered encounter medications on file as of 10/09/2019.     Objective:   Goals Addressed            This Visit's Progress   . I would like to continue to optimize his medication management of my chronic conditions.       Spouse stated Current Barriers:  . Polypharmacy; complex patient with multiple comorbidities including dementia, diabetes, hypertension . Wife assists with managing medications via [employer] pill packaging system.  Patient continues to take pills out and sometimes does not take them.  Pharmacist Clinical Goal(s):  [name] Over the next 90 days, patient will work with PharmD and provider towards optimized medication management  Interventions: 10/09/19 completed clinic visit with patient and spouse . Comprehensive medication review performed; medication list updated in electronic medical record . Counseled wife on working to oversee pill pack administration as patient takes pills out at times and forgets to take them.  She continues  to give patient the Namenda twice daily.  She has not seen a great benefit from this. . Reviewed & discussed the following diabetes-related information with patient: o Continue checking blood sugars as directed (twice daily if able) o Follow ADA recommended "diabetes-friendly" diet  (reviewed healthy snack/food options); Patient continues to eat desserts.  Wife states she is trying to limit, however patient is headstrong at times. Discussed with her the likelihood of having to add more antidiabetes medications. o Patients BG was checked today in clinic =  187 (3 hrs after smoothie this AM) - New one touch verio flex meter was set up with patient and wife at visit in 10/09/19 - Will make sure lancets and test strips are called in for patient o Confirmed current DM regimen: Metformin once daily, Trulicity weekly (actos d/c'd due to edema/CKD) o Discussed GLP-1 injection technique.  Patient is self-administering per wife. o Reviewed medication purpose/side effects-->wife denies adverse events, denies hypoglycemia o Most recent A1c is 10.5% on 09/29/19 (continues to increase: 8.2% on 6/23, 7.5% on 3/23.  Wife working to give medications as prescribed, but receives resistance from patient as he is battling with dementia.  They are using compliance packaging by [employer] [employer] mail order.  This method appears to be the best method of organization/compliance. o Current anti-hypertensive regimen: carvedilol, amlodipine, valsartan o Current anti-hyperlipidemia regimen: pravastatin 20mg  daily o Continue taking all medications as prescribed by provider o Patient now has all medications in pill packs as ordered at last CCM PharmD visit   CCM RN CM Interventions:  10/08/19 completed call with spouse [name]  Inbound call received from spouse [name] . Evaluation of current treatment plan related to Diabetes and patient's adherence to plan as established by provider. . Advised patient to stop by the office to pick up his new glucometer; discussed the [facility] nurse will work with Mr. Caggiano and [name] on how to properly use the glucometer; discussed the embedded Pharm D Lottie Dawson is also available if needed and can scheduled a face to face OV if further instruction is needed . Discussed plans with patient for ongoing care management follow up and provided patient with direct contact information for care management team . Advised patient, providing education and rationale, to check cbg daily before meals and record, calling the CCM team and or PCP for  findings outside established parameters.   . Discussed Mr. Nicklaus is riding his exercise bike daily in the evenings and is feeling better overall . Discussed [name] is waking him earlier to ensure he is checking his CBG, eating breakfast and taking his am medications . Discussed meal preparation and dinner time is now earlier in the evening - educated [name] on the importance of closely monitoring Mr. Dickison CBG's at home due to adding exercise to his daily routine can lower his CBG's - educated [name] on measures to take for hypoglycemia  . Mailed printed signs/symptoms hypo/hyperglycemia reference sheet  Patient Self Care Activities:  . Patient will take medications as prescribed with help from wife and pill packaging system . Patient will focus on improved adherence by allowing wife to assist with pill pack administration  Please see past updates related to this goal by clicking on the "Past Updates" button in the selected goal          Plan:   The care management team will reach out to the patient again over the next 30 days.   Provider Signature Cleotilde Neer  Blanca Friend, PharmD, BCPS Clinical Pharmacist, [facility] [place]: [phone]

## 2019-10-12 NOTE — Progress Notes (Signed)
Chronic Care Management   Visit Note  10/09/2019 Name: Selassie Spatafore. MRN: [id number] DOB: 06/09/1941  Referred by: [name], MD Reason for referral : Chronic Care Management   Maggie Dworkin. is a 78 y.o. year old male who is a primary care patient of [name], MD. 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    I met with Mr. Cicio and Ms. [name] in clinic today.  Medications: Outpatient Encounter Medications as of 10/08/2019  Medication Sig Note  . allopurinol (ZYLOPRIM) 100 MG tablet TAKE 1 TABLET BY MOUTH EVERY DAY   . amLODipine (NORVASC) 5 MG tablet TAKE 1 TABLET BY MOUTH DAILY   . Ascorbic Acid (VITAMIN C) 1000 MG tablet Take 1,000 mg by mouth daily.   [name] aspirin 81 MG tablet Take 81 mg by mouth 3 (three) times a week.    . carvedilol (COREG) 12.5 MG tablet Take 1 tablet (12.5 mg total) by mouth 2 (two) times daily.   . Cholecalciferol (VITAMIN D3) 5000 units CAPS Take 5,000 Units by mouth every evening.   . Coenzyme Q10 200 MG capsule Take 200 mg by mouth daily.   [name] COLCRYS 0.6 MG tablet Take 1 tablet (0.6 mg total) by mouth daily. (Patient taking differently: Take 0.6 mg by mouth as needed. ) 12/12/2018: Takes as needed for gout flare.   . cyclobenzaprine (FLEXERIL) 5 MG tablet Take 5-10 mg by mouth at bedtime as needed for muscle spasms.   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 memantine (NAMENDA) 10 MG tablet Take 1 tablet (10 mg total) by mouth 2 (two) times daily.   . metFORMIN (GLUCOPHAGE) 1000 MG tablet Take one  tablet by mouth daily.   . methocarbamol (ROBAXIN) 500 MG tablet Take 500 mg by mouth daily as needed for muscle spasms.   Glory Rosebush Delica Lancets 37J MISC Use as instructed to check blood sugars daily dx: e11.22   . pravastatin (PRAVACHOL) 20 MG tablet TAKE 1 TABLET BY ORAL ROUTE EVERY DAY   . TRULICITY 1.5 IR/6.7EL SOPN INJECT 1.5 MG INTO THE SKIN ONCE A WEEK.   . valsartan (DIOVAN) 320 MG tablet TAKE 1 TABLET BY MOUTH EVERY DAY    No facility-administered encounter medications on file as of 10/08/2019.     Objective:   Goals Addressed            This Visit's Progress   . I would like to continue to optimize his medication management of my chronic conditions.       Spouse stated Current Barriers:  . Polypharmacy; complex patient with multiple comorbidities including dementia, diabetes, hypertension . Wife assists with managing medications via [employer] pill packaging system.  Patient continues to take pills out and sometimes does not take them.  Pharmacist Clinical Goal(s):  [name] Over the next 90 days, patient will work with PharmD and provider towards optimized medication management  Interventions: 10/09/19 completed clinic visit with patient and spouse . Comprehensive medication review performed; medication list updated in electronic medical record . Counseled wife on working to oversee pill pack administration as patient takes pills out  at times and forgets to take them.  She continues to give patient the Namenda twice daily.  She has not seen a great benefit from this. . Reviewed & discussed the following diabetes-related information with patient: o Continue checking blood sugars as directed o Follow ADA recommended "diabetes-friendly" diet  (reviewed healthy snack/food options); Patient continues to eat desserts.  Wife states she is trying to limit, however patient is headstrong at times. Discussed with her the likelihood of having to add more antidiabetes medications. o Patients BG  was checked today in clinic = 187 (3 hrs after smoothie this AM) - New one touch verio flex meter was set up with patient and wife at visit in 10/09/19 - Will make sure lancets and test strips are called in for patient o Confirmed current DM regimen: Metformin once daily, Trulicity weekly (actos d/c'd due to edema/CKD) o Discussed GLP-1 injection technique.  Patient is self-administering per wife. o Reviewed medication purpose/side effects-->wife denies adverse events, denies hypoglycemia o Most recent A1c is 10.5% on 09/29/19 (continues to increase: 8.2% on 6/23, 7.5% on 3/23.  Wife working to give medications as prescribed, but receives resistance from patient as he is battling with dementia.  They are using compliance packaging by [employer] mail order.  This method appears to be the best method of organization/compliance. o Current anti-hypertensive regimen: carvedilol, amlodipine, valsartan o Current anti-hyperlipidemia regimen: pravastatin 23m daily o Continue taking all medications as prescribed by provider o Patient now has all medications in pill packs as ordered at last CCM PharmD visit   CCM RN CM Interventions:  10/08/19 completed call with spouse GPeter Congo Inbound call received from spouse GStaci Righter. Evaluation of current treatment plan related to Diabetes and patient's adherence to plan as established by provider. . Advised patient to stop by the office to pick up his new glucometer; [facility] will work with Mr. JCadieuxand Ms. GArsenio Loaderon how to properly use the glucometer; discussed the embedded Pharm D JLottie Dawsonis also available if needed and can scheduled a face to face OV if further instruction is needed . Discussed plans with patient for ongoing care management follow up and provided patient with direct contact information for care management team . Advised patient, providing education and rationale, to check cbg daily before meals and record, calling  the CCM team and or PCP for findings outside established parameters.   . Discussed Mr. JDingleyis riding his exercise bike daily in the evenings and is feeling better overall . Discussed Ms. GArsenio Loaderis waking him earlier to ensure he is checking his CBG, eating breakfast and taking his am medications . Discussed meal preparation and dinner time is now earlier in the evening - educated Ms. GArsenio Loaderon the importance of closely monitoring Mr. JPerzCBG's at home due to adding exercise to his daily routine can lower his CBG's - educated Ms. GArsenio Loaderon measures to take for hypoglycemia  . Mailed printed signs/symptoms hypo/hyperglycemia reference sheet  Patient Self Care Activities:  . Patient will take medications as prescribed with help from wife and pill packaging system . Patient will focus on improved adherence by allowing wife to assist with pill pack administration  Please see past updates related to this goal by clicking on the "Past Updates" button in the selected goal          Plan:   The care management team will reach out to the patient again over the next 30 days.  Provider Signature  Regina Eck, PharmD, BCPS Clinical Pharmacist, [facility] [place]: [phone]

## 2019-10-12 NOTE — Patient Instructions (Signed)
Visit Information  Goals Addressed            This Visit's Progress   . I would like to continue to optimize his medication management of my chronic conditions.       Spouse stated Current Barriers:  . Polypharmacy; complex patient with multiple comorbidities including dementia, diabetes, hypertension . Wife assists with managing medications via [employer] pill packaging system.  Patient continues to take pills out and sometimes does not take them.  Pharmacist Clinical Goal(s):  [name] Over the next 90 days, patient will work with PharmD and provider towards optimized medication management  Interventions: 10/09/19 completed clinic visit with patient and spouse . Comprehensive medication review performed; medication list updated in electronic medical record . Counseled wife on working to oversee pill pack administration as patient takes pills out at times and forgets to take them.  She continues to give patient the Namenda twice daily.  She has not seen a great benefit from this. . Reviewed & discussed the following diabetes-related information with patient: o Continue checking blood sugars as directed o Follow ADA recommended "diabetes-friendly" diet  (reviewed healthy snack/food options); Patient continues to eat desserts.  Wife states she is trying to limit, however patient is headstrong at times. Discussed with her the likelihood of having to add more antidiabetes medications. o Patients BG was checked today in clinic = 187 (3 hrs after smoothie this AM) - New one touch verio flex meter was set up with patient and wife at visit in 10/09/19 - Will make sure lancets and test strips are called in for patient o Confirmed current DM regimen: Metformin once daily, Trulicity weekly (actos d/c'd due to edema/CKD) o Discussed GLP-1 injection technique.  Patient is self-administering per wife. o Reviewed medication purpose/side effects-->wife denies adverse events, denies hypoglycemia o Most recent A1c is  10.5% on 09/29/19 (continues to increase: 8.2% on 6/23, 7.5% on 3/23.  Wife working to give medications as prescribed, but receives resistance from patient as he is battling with dementia.  They are using compliance packaging by [employer] mail order.  This method appears to be the best method of organization/compliance. o Current anti-hypertensive regimen: carvedilol, amlodipine, valsartan o Current anti-hyperlipidemia regimen: pravastatin 20mg  daily o Continue taking all medications as prescribed by provider o Patient now has all medications in pill packs as ordered at last CCM PharmD visit   CCM RN CM Interventions:  10/08/19 completed call with spouse [name]  Inbound call received from spouse [name] . Evaluation of current treatment plan related to Diabetes and patient's adherence to plan as established by provider. . Advised patient to stop by the office to pick up his new glucometer; discussed the [facility] nurse will work with Mr. Edgin and [name] on how to properly use the glucometer; discussed the embedded Pharm D Lottie Dawson is also available if needed and can scheduled a face to face OV if further instruction is needed . Discussed plans with patient for ongoing care management follow up and provided patient with direct contact information for care management team . Advised patient, providing education and rationale, to check cbg daily before meals and record, calling the CCM team and or PCP for findings outside established parameters.   . Discussed Mr. Wimer is riding his exercise bike daily in the evenings and is feeling better overall . Discussed [name] is waking him earlier to ensure he is checking his CBG, eating breakfast and taking his am medications . Discussed meal  preparation and dinner time is now earlier in the evening - educated [name] on the importance of closely monitoring Mr. Schopf CBG's at home due to adding exercise to his daily routine can  lower his CBG's - educated [name] on measures to take for hypoglycemia  . Mailed printed signs/symptoms hypo/hyperglycemia reference sheet  Patient Self Care Activities:  . Patient will take medications as prescribed with help from wife and pill packaging system . Patient will focus on improved adherence by allowing wife to assist with pill pack administration  Please see past updates related to this goal by clicking on the "Past Updates" button in the selected goal         The patient verbalized understanding of instructions provided today and declined a print copy of patient instruction materials.   The care management team will reach out to the patient again over the next 30 days.   SIGNATURE Regina Eck, PharmD, BCPS Clinical Pharmacist, [facility] [place]: [phone]

## 2019-10-14 ENCOUNTER — Other Ambulatory Visit: Payer: Self-pay

## 2019-10-14 MED ORDER — ONETOUCH DELICA LANCETS 33G MISC: 3 refills | Status: DC

## 2019-10-14 MED ORDER — ONETOUCH VERIO VI STRP: ORAL_STRIP | 3 refills | Status: DC

## 2019-10-14 NOTE — Patient Instructions (Signed)
Visit Information  Goals Addressed            This Visit's Progress   . I would like to continue to optimize his medication management of my chronic conditions.       Spouse stated Current Barriers:  . Polypharmacy; complex patient with multiple comorbidities including dementia, diabetes, hypertension . Wife assists with managing medications via [employer] pill packaging system.  Patient continues to take pills out and sometimes does not take them.  Pharmacist Clinical Goal(s):  [name] Over the next 90 days, patient will work with PharmD and provider towards optimized medication management  Interventions: 10/09/19 completed clinic visit with patient and spouse . Comprehensive medication review performed; medication list updated in electronic medical record . Counseled wife on working to oversee pill pack administration as patient takes pills out at times and forgets to take them.  She continues to give patient the Namenda twice daily.  She has not seen a great benefit from this. . Reviewed & discussed the following diabetes-related information with patient: o Continue checking blood sugars as directed (twice daily if able) o Follow ADA recommended "diabetes-friendly" diet  (reviewed healthy snack/food options); Patient continues to eat desserts.  Wife states she is trying to limit, however patient is headstrong at times. Discussed with her the likelihood of having to add more antidiabetes medications. o Patients BG was checked today in clinic = 187 (3 hrs after smoothie this AM) - New one touch verio flex meter was set up with patient and wife at visit in 10/09/19 - Will make sure lancets and test strips are called in for patient o Confirmed current DM regimen: Metformin once daily, Trulicity weekly (actos d/c'd due to edema/CKD) o Discussed GLP-1 injection technique.  Patient is self-administering per wife. o Reviewed medication purpose/side effects-->wife denies adverse events, denies  hypoglycemia o Most recent A1c is 10.5% on 09/29/19 (continues to increase: 8.2% on 6/23, 7.5% on 3/23.  Wife working to give medications as prescribed, but receives resistance from patient as he is battling with dementia.  They are using compliance packaging by [employer] mail order.  This method appears to be the best method of organization/compliance. o Current anti-hypertensive regimen: carvedilol, amlodipine, valsartan o Current anti-hyperlipidemia regimen: pravastatin 20mg  daily o Continue taking all medications as prescribed by provider o Patient now has all medications in pill packs as ordered at last CCM PharmD visit   CCM RN CM Interventions:  10/08/19 completed call with spouse [name]  Inbound call received from spouse [name] . Evaluation of current treatment plan related to Diabetes and patient's adherence to plan as established by provider. . Advised patient to stop by the office to pick up his new glucometer; discussed the [facility] nurse will work with Mr. Barfoot and [name] on how to properly use the glucometer; discussed the embedded Pharm D Lottie Dawson is also available if needed and can scheduled a face to face OV if further instruction is needed . Discussed plans with patient for ongoing care management follow up and provided patient with direct contact information for care management team . Advised patient, providing education and rationale, to check cbg daily before meals and record, calling the CCM team and or PCP for findings outside established parameters.   . Discussed Mr. Esterline is riding his exercise bike daily in the evenings and is feeling better overall . Discussed [name] is waking him earlier to ensure he is checking his CBG, eating breakfast and taking his am  medications . Discussed meal preparation and dinner time is now earlier in the evening - educated [name] on the importance of closely monitoring Mr. Craighead CBG's at home due to adding  exercise to his daily routine can lower his CBG's - educated [name] on measures to take for hypoglycemia  . Mailed printed signs/symptoms hypo/hyperglycemia reference sheet  Patient Self Care Activities:  . Patient will take medications as prescribed with help from wife and pill packaging system . Patient will focus on improved adherence by allowing wife to assist with pill pack administration  Please see past updates related to this goal by clicking on the "Past Updates" button in the selected goal         The patient verbalized understanding of instructions provided today and declined a print copy of patient instruction materials.   The care management team will reach out to the patient again over the next 30 days.   SIGNATURE Regina Eck, PharmD, BCPS Clinical Pharmacist, [facility] [place]: [phone]

## 2019-10-15 ENCOUNTER — Telehealth: Payer: Medicare HMO

## 2019-10-16 NOTE — Progress Notes (Signed)
I have reviewed and agreed above plan. 

## 2019-10-17 ENCOUNTER — Encounter: Payer: Self-pay | Admitting: Internal Medicine

## 2019-10-17 ENCOUNTER — Other Ambulatory Visit: Payer: Self-pay

## 2019-10-17 ENCOUNTER — Encounter: Payer: Medicare HMO | Admitting: Internal Medicine

## 2019-10-17 ENCOUNTER — Ambulatory Visit (INDEPENDENT_AMBULATORY_CARE_PROVIDER_SITE_OTHER): Payer: Medicare HMO

## 2019-10-17 ENCOUNTER — Ambulatory Visit (INDEPENDENT_AMBULATORY_CARE_PROVIDER_SITE_OTHER): Payer: Medicare HMO | Admitting: Internal Medicine

## 2019-10-17 VITALS — BP 126/76 | HR 90 | Temp 98.2°F | Ht 68.2 in | Wt 190.4 lb

## 2019-10-17 DIAGNOSIS — Z0000 Encounter for general adult medical examination without abnormal findings: Secondary | ICD-10-CM

## 2019-10-17 DIAGNOSIS — I129 Hypertensive chronic kidney disease with stage 1 through stage 4 chronic kidney disease, or unspecified chronic kidney disease: Secondary | ICD-10-CM | POA: Diagnosis not present

## 2019-10-17 DIAGNOSIS — N1831 Chronic kidney disease, stage 3a: Secondary | ICD-10-CM | POA: Diagnosis not present

## 2019-10-17 DIAGNOSIS — E1122 Type 2 diabetes mellitus with diabetic chronic kidney disease: Secondary | ICD-10-CM

## 2019-10-17 DIAGNOSIS — H6123 Impacted cerumen, bilateral: Secondary | ICD-10-CM

## 2019-10-17 DIAGNOSIS — E1121 Type 2 diabetes mellitus with diabetic nephropathy: Secondary | ICD-10-CM

## 2019-10-17 NOTE — Patient Instructions (Signed)
Dylan [name] , Thank you for taking time to come for your Medicare Wellness Visit. I appreciate your ongoing commitment to your health goals. Please review the following plan we discussed and let me know if I can assist you in the future.   Screening recommendations/referrals: Colonoscopy: not required Recommended yearly ophthalmology/optometry visit for glaucoma screening and checkup Recommended yearly dental visit for hygiene and checkup  Vaccinations: Influenza vaccine: 07/2019 Pneumococcal vaccine: 08/2016 Tdap vaccine: 10/2014 Shingles vaccine: discussed    Advanced directives: Please bring a copy of your POA (Power of [place]) and/or Living Will to your next appointment.    Conditions/risks identified: overweight  Next appointment: 10/20/2020 at 12:00  Preventive Care 77 Years and Older, Male Preventive care refers to lifestyle choices and visits with your health care provider that can promote health and wellness. What does preventive care include?  A yearly physical exam. This is also called an annual well check.  Dental exams once or twice a year.  Routine eye exams. Ask your health care provider how often you should have your eyes checked.  Personal lifestyle choices, including:  Daily care of your teeth and gums.  Regular physical activity.  Eating a healthy diet.  Avoiding tobacco and drug use.  Limiting alcohol use.  Practicing safe sex.  Taking low doses of aspirin every day.  Taking vitamin and mineral supplements as recommended by your health care provider. What happens during an annual well check? The services and screenings done by your health care provider during your annual well check will depend on your age, overall health, lifestyle risk factors, and family history of disease. Counseling  Your health care provider may ask you questions about your:  Alcohol use.  Tobacco use.  Drug use.  Emotional well-being.  Home and relationship  well-being.  Sexual activity.  Eating habits.  History of falls.  Memory and ability to understand (cognition).  Work and work Statistician. Screening  You may have the following tests or measurements:  Height, weight, and BMI.  Blood pressure.  Lipid and cholesterol levels. These may be checked every 5 years, or more frequently if you are over 28 years old.  Skin check.  Lung cancer screening. You may have this screening every year starting at age 77 if you have a 30-pack-year history of smoking and currently smoke or have quit within the past 15 years.  Fecal occult blood test (FOBT) of the stool. You may have this test every year starting at age 5.  Flexible sigmoidoscopy or colonoscopy. You may have a sigmoidoscopy every 5 years or a colonoscopy every 10 years starting at age 39.  Prostate cancer screening. Recommendations will vary depending on your family history and other risks.  Hepatitis C blood test.  Hepatitis B blood test.  Sexually transmitted disease (STD) testing.  Diabetes screening. This is done by checking your blood sugar (glucose) after you have not eaten for a while (fasting). You may have this done every 1-3 years.  Abdominal aortic aneurysm (AAA) screening. You may need this if you are a current or former smoker.  Osteoporosis. You may be screened starting at age 14 if you are at high risk. Talk with your health care provider about your test results, treatment options, and if necessary, the need for more tests. Vaccines  Your health care provider may recommend certain vaccines, such as:  Influenza vaccine. This is recommended every year.  Tetanus, diphtheria, and acellular pertussis (Tdap, Td) vaccine. You may need a Td booster  every 10 years.  Zoster vaccine. You may need this after age 77.  Pneumococcal 13-valent conjugate (PCV13) vaccine. One dose is recommended after age 67.  Pneumococcal polysaccharide (PPSV23) vaccine. One dose is  recommended after age 15. Talk to your health care provider about which screenings and vaccines you need and how often you need them. This information is not intended to replace advice given to you by your health care provider. Make sure you discuss any questions you have with your health care provider. Document Released: 11/13/2015 Document Revised: 07/06/2016 Document Reviewed: 08/18/2015 Elsevier Interactive Patient Education  2017 [place] Prevention in the Home Falls can cause injuries. They can happen to people of all ages. There are many things you can do to make your home safe and to help prevent falls. What can I do on the outside of my home?  Regularly fix the edges of walkways and driveways and fix any cracks.  Remove anything that might make you trip as you walk through a door, such as a raised step or threshold.  Trim any bushes or trees on the path to your home.  Use bright outdoor lighting.  Clear any walking paths of anything that might make someone trip, such as rocks or tools.  Regularly check to see if handrails are loose or broken. Make sure that both sides of any steps have handrails.  Any raised decks and porches should have guardrails on the edges.  Have any leaves, snow, or ice cleared regularly.  Use sand or salt on walking paths during winter.  Clean up any spills in your garage right away. This includes oil or grease spills. What can I do in the bathroom?  Use night lights.  Install grab bars by the toilet and in the tub and shower. Do not use towel bars as grab bars.  Use non-skid mats or decals in the tub or shower.  If you need to sit down in the shower, use a plastic, non-slip stool.  Keep the floor dry. Clean up any water that spills on the floor as soon as it happens.  Remove soap buildup in the tub or shower regularly.  Attach bath mats securely with double-sided non-slip rug tape.  Do not have throw rugs and other things on  the floor that can make you trip. What can I do in the bedroom?  Use night lights.  Make sure that you have a light by your bed that is easy to reach.  Do not use any sheets or blankets that are too big for your bed. They should not hang down onto the floor.  Have a firm chair that has side arms. You can use this for support while you get dressed.  Do not have throw rugs and other things on the floor that can make you trip. What can I do in the kitchen?  Clean up any spills right away.  Avoid walking on wet floors.  Keep items that you use a lot in easy-to-reach places.  If you need to reach something above you, use a strong step stool that has a grab bar.  Keep electrical cords out of the way.  Do not use floor polish or wax that makes floors slippery. If you must use wax, use non-skid floor wax.  Do not have throw rugs and other things on the floor that can make you trip. What can I do with my stairs?  Do not leave any items on the stairs.  Make  sure that there are handrails on both sides of the stairs and use them. Fix handrails that are broken or loose. Make sure that handrails are as long as the stairways.  Check any carpeting to make sure that it is firmly attached to the stairs. Fix any carpet that is loose or worn.  Avoid having throw rugs at the top or bottom of the stairs. If you do have throw rugs, attach them to the floor with carpet tape.  Make sure that you have a light switch at the top of the stairs and the bottom of the stairs. If you do not have them, ask someone to add them for you. What else can I do to help prevent falls?  Wear shoes that:  Do not have high heels.  Have rubber bottoms.  Are comfortable and fit you well.  Are closed at the toe. Do not wear sandals.  If you use a stepladder:  Make sure that it is fully opened. Do not climb a closed stepladder.  Make sure that both sides of the stepladder are locked into place.  Ask someone to  hold it for you, if possible.  Clearly mark and make sure that you can see:  Any grab bars or handrails.  First and last steps.  Where the edge of each step is.  Use tools that help you move around (mobility aids) if they are needed. These include:  Canes.  Walkers.  Scooters.  Crutches.  Turn on the lights when you go into a dark area. Replace any light bulbs as soon as they burn out.  Set up your furniture so you have a clear path. Avoid moving your furniture around.  If any of your floors are uneven, fix them.  If there are any pets around you, be aware of where they are.  Review your medicines with your doctor. Some medicines can make you feel dizzy. This can increase your chance of falling. Ask your doctor what other things that you can do to help prevent falls. This information is not intended to replace advice given to you by your health care provider. Make sure you discuss any questions you have with your health care provider. Document Released: 08/13/2009 Document Revised: 03/24/2016 Document Reviewed: 11/21/2014 Elsevier Interactive Patient Education  2017 [employer].

## 2019-10-17 NOTE — Progress Notes (Signed)
This visit occurred during the SARS-CoV-2 public health emergency.  Safety protocols were in place, including screening questions prior to the visit, additional usage of staff PPE, and extensive cleaning of exam room while observing appropriate contact time as indicated for disinfecting solutions.  Subjective:   Dylan [name]. is a 78 y.o. male who presents for Medicare Annual/Subsequent preventive examination.  Review of Systems:  n/a       Objective:    Vitals: BP 126/76 (BP Location: Left Arm, Patient Position: Sitting, Cuff Size: Normal)   Pulse 90   Temp 98.2 F (36.8 C) (Oral)   Ht 5' 8.2" (1.732 m)   Wt 190 lb 6.4 oz (86.4 kg)   SpO2 98%   BMI 28.78 kg/m   Body mass index is 28.78 kg/m.  Advanced Directives 10/17/2019 12/12/2018 10/04/2018 06/14/2018 06/13/2018 07/11/2017 07/11/2017  Does Patient Have a Medical Advance Directive? Yes Yes No - No - No  Type of Paramedic of [place];Living will - - - - -  Does patient want to make changes to medical advance directive? - No - Patient declined Yes (MAU/Ambulatory/Procedural Areas - Information given) No - Patient declined - - No - Patient declined  Copy of [place] in Chart? Yes - validated most recent copy scanned in chart (See row information) No - copy requested - - - - -  Would patient like information on creating a medical advance directive? - - - No - Patient declined No - Patient declined No - Patient declined -    Tobacco Social History   Tobacco Use  Smoking Status Former Smoker  . Packs/day: 0.50  . Years: 7.00  . Pack years: 3.50  Smokeless Tobacco Never Used  Tobacco Comment   quit 35 years     Counseling given: Not Answered Comment: quit 35 years   Clinical Intake:  Pre-visit preparation completed: Yes  Pain : No/denies pain     Nutritional Status: BMI 25 -29 Overweight Nutritional Risks: None Diabetes: Yes CBG done?:  No Did pt. bring in CBG monitor from home?: No  How often do you need to have someone help you when you read instructions, pamphlets, or other written materials from your doctor or pharmacy?: 4 - Often     Information entered by :: NAllen LPN  Past Medical History:  Diagnosis Date  . Anxiety   . Aortic stenosis    mild AS 11/24/16 (peak grad 24, mean grad 11) Dr. [name]  . CHB (complete heart block) ([place]) 07/2017  . 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 Diabetes mellitus without complication ([place])   . Gout   . Hyperlipemia   . Hypertension   . 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 Memory loss   . Peripheral arterial disease ([place])   . 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 Second degree AV block    Wenckebach; no indication for pacemaker as of 12/01/16 (Dr. [name])  . Vitamin B12 deficiency anemia 03/01/2018  . Vitamin D deficiency disease    Past Surgical History:  Procedure Laterality Date  . CARDIOVASCULAR STRESS TEST     11/21/16 Low risk study, EF 52% [facility])  . EYE SURGERY    . KYPHOPLASTY N/A 02/15/2017   Procedure: LUMBAR FOUR KYPHOPLASTY;  Surgeon: Phylliss Bob, MD;  Location: [facility];  Service: Orthopedics;  Laterality: N/A;  . lipoma surgery     neck - 30 years ago  .  PACEMAKER IMPLANT N/A 07/11/2017   Procedure: Pacemaker Implant;  Surgeon: Constance Haw, MD;  Location: [facility] CV LAB;  Service: Cardiovascular;  Laterality: N/A;  . TRANSTHORACIC ECHOCARDIOGRAM     11/24/16 [facility]): EF 55-60%, mild AS, mild-mod MR, mod TR, moderate pulm HTN, PAP 49 mmHg   Family History  Problem Relation Age of Onset  . Diabetes Mother   . Breast cancer Mother   . Arthritis Mother   . Hypertension Father   . Diabetes Sister   . Diabetes Brother   . Diabetes Maternal Grandmother   . Diabetes Brother   . Diabetes Brother   . Diabetes Brother   . Diabetes Sister   . Diabetes Sister   . Diabetes Sister     Social History   Socioeconomic History  . Marital status: Married    Spouse name: Not on file  . Number of children: 3  . Years of education: 32  . Highest education level: High school graduate  Occupational History  . Occupation: retired  Tobacco Use  . Smoking status: Former Smoker    Packs/day: 0.50    Years: 7.00    Pack years: 3.50  . Smokeless tobacco: Never Used  . Tobacco comment: quit 35 years  Substance and Sexual Activity  . Alcohol use: No  . Drug use: No  . Sexual activity: Not Currently  Other Topics Concern  . Not on file  Social History Narrative   Lives at home with his fiancee.   Right-handed.   No daily use of caffeine.      Social Determinants of Health   Financial Resource Strain:   . Difficulty of Paying Living Expenses: Not on file  Food Insecurity:   . Worried About Charity fundraiser in the Last Year: Not on file  . Ran Out of Food in the Last Year: Not on file  Transportation Needs:   . Lack of Transportation (Medical): Not on file  . Lack of Transportation (Non-Medical): Not on file  Physical Activity:   . Days of Exercise per Week: Not on file  . Minutes of Exercise per Session: Not on file  Stress:   . Feeling of Stress : Not on file  Social Connections:   . Frequency of Communication with Friends and Family: Not on file  . Frequency of Social Gatherings with Friends and Family: Not on file  . Attends Religious Services: Not on file  . Active Member of Clubs or Organizations: Not on file  . Attends Archivist Meetings: Not on file  . Marital Status: Not on file    Outpatient Encounter Medications as of 10/17/2019  Medication Sig  . allopurinol (ZYLOPRIM) 100 MG tablet TAKE 1 TABLET BY MOUTH EVERY DAY  . amLODipine (NORVASC) 5 MG tablet TAKE 1 TABLET BY MOUTH DAILY  . Ascorbic Acid (VITAMIN C) 1000 MG tablet Take 1,000 mg by mouth daily.  [name] aspirin 81 MG tablet Take 81 mg by mouth 3 (three) times a week.   . Blood  Pressure KIT Check blood pressure daily 2 hours or more after taking medication  . carvedilol (COREG) 12.5 MG tablet Take 1 tablet (12.5 mg total) by mouth 2 (two) times daily.  . Cholecalciferol (VITAMIN D3) 5000 units CAPS Take 5,000 Units by mouth every evening.  . Coenzyme Q10 200 MG capsule Take 200 mg by mouth daily.  [name] COLCRYS 0.6 MG tablet Take 1 tablet (0.6 mg total) by mouth daily. (Patient taking  differently: Take 0.6 mg by mouth as needed. )  . cyclobenzaprine (FLEXERIL) 5 MG tablet Take 5-10 mg by mouth at bedtime as needed for muscle spasms.  . diclofenac Sodium (VOLTAREN) 1 % GEL Apply 2 g topically 3 (three) times daily.  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 memantine (NAMENDA) 10 MG tablet Take 1 tablet (10 mg total) by mouth 2 (two) times daily.  . metFORMIN (GLUCOPHAGE) 1000 MG tablet Take one tablet by mouth daily.  . methocarbamol (ROBAXIN) 500 MG tablet Take 500 mg by mouth daily as needed for muscle spasms.  Glory Rosebush Delica Lancets 00F MISC Use as instructed to check blood sugars daily dx: e11.22  . pravastatin (PRAVACHOL) 20 MG tablet TAKE 1 TABLET BY ORAL ROUTE EVERY DAY  . TRULICITY 1.5 VC/9.4WH SOPN INJECT 1.5 MG INTO THE SKIN ONCE A WEEK.  . valsartan (DIOVAN) 320 MG tablet TAKE 1 TABLET BY MOUTH EVERY DAY   No facility-administered encounter medications on file as of 10/17/2019.    Activities of Daily Living In your present state of health, do you have any difficulty performing the following activities: 10/17/2019 10/10/2019  Hearing? N N  Vision? Y Y  Comment sometimes blurry -  Difficulty concentrating or making decisions? Tempie Donning  Walking or climbing stairs? Y Y  Comment - uses cane  Dressing or bathing? Y Y  Doing errands, shopping? Y Y  Comment - someone brings him to appts  Preparing Food and eating ? Y -  Using the Toilet? Y -  In the past six months, have you accidently leaked urine?  Y -  Do you have problems with loss of bowel control? N -  Managing your Medications? Y -  Managing your Finances? Y -  Some recent data might be hidden    Patient Care Team: [name], MD as PCP - General (Internal Medicine) Adrian Prows, MD as PCP - Cardiology (Cardiology) Constance Haw, MD as Consulting Physician (Cardiology) Rex Kras, Claudette Stapler, RN as Case Manager Daneen Schick as Social Worker Pruitt, Royce Macadamia, [facility] (Pharmacist)   Assessment:   This is a routine wellness examination for Delrae Phelan.  Exercise Activities and Dietary recommendations Current Exercise Habits: Home exercise routine, Type of exercise: strength training/weights;stretching, Time (Minutes): 30, Frequency (Times/Week): 2, Weekly Exercise (Minutes/Week): 60  Goals    . "He's eating too many sweets"     Spouse stated Current Barriers:  [name] Knowledge Deficits related to diabetes Meal planning using the plate method and portion control  Nurse Case Manager Clinical Goal(s):  [name] Over the next 90 days, patient will work with the CCM team to address needs related to diabetes Self Health management specifically related to nutritional recommendations  CCM RN CM Interventions:  10/02/19 call completed with spouse [name]   . Evaluation of current treatment plan related to diabetes and patient's adherence to plan as established by provider. . Provided education to patient re: importance of diabetic dietary recommendations including adherence to following a low carb diet using Meal Planning with the plate method and portion control; discussed patient's recent A1C increased to 8.3; education provided related to potential health risks for uncontrolled diabetes  . Reviewed medications with patient and discussed patient is adhering to taking his weekly injection of Trulicity and patient is self injecting; discussed spouse [name] is not administering all medications to patient and is supervising him as he takes the  meds  .  Discussed plans with patient for ongoing care management follow up and provided patient with direct contact information for care management team . Advised patient, providing education and rationale, to check cbg daily before meals and record, calling the CCM team and or PCP for findings outside established parameters.  <80 and or >250 . Discussed Mr. Jeanlouis has not been Self monitoring his CBG's; discussed [name] is unfamiliar with how to use a glucometer . Discussed [place] services are pending and [name] was encouraged to have the [facility] nurse teach her how to monitor the patient's CBG's - she agreed . Discussed [name] is waking Mr. Ramone up in the am for breakfast and is working on having him eat three good meals per day and this has improved his sleep schedule, and his mood . Positive reinforcement given to [name] for taking great care of Mr. Erdahl and trying to be proactive in his care . Discussed she has noticed when its meal time and he has not eaten his mood changes; she will begin to monitor for hypoglycemia once checking his CBG's when these episode occur and will report hypoglycemic events to the CCM team and to [name]   Patient Self Care Activities:  . Unable to independently perform self care  Please see past updates related to this goal by clicking on the "Past Updates" button in the selected goal      . "He's getting injections in his back"     Spouse states Current Barriers:  [name] Knowledge Deficits related to diagnosis and treatment management for lumbar pain  . Vascular dementia . Diabetes  Nurse Case Manager Clinical Goal(s):  [name] Over the next 30 days, patient/spouse will verbalize understanding of plan for treatment plan for lumbar pain . Over the next 30 days, spouse will report patient's CBG's remain to be WNL and will verbalize knowing when to call the CCM team and or PCP for abnormal readings  CCM RN CM Interventions:  08/07/19 call  completed with spouse [name]   . Evaluation of current treatment plan related to lumbar spinal pain and patient's adherence to plan as established by provider. . Provided education to patient re: the importance to closely monitor patient's CBG's due to potential for Hyperglycemia while receiving steroid injections to lumbar spine . Discussed plans with patient for ongoing care management follow up and provided patient with direct contact information for care management team . Advised patient, providing education and rationale, to check cbg daily before meals and record, calling CCM team and or PCP for findings outside established parameters.  <80 and or >250  Patient Self Care Activities with assistance from spouse . Self administers medications as prescribed . Attends all scheduled provider appointments . Performs ADL's independently . Performs IADL's independently   Initial goal documentation     . "He's having more urinary incontinence"     Spouse stated Current Barriers:  [name] Knowledge Deficits related to diagnosis and treatment of urinary incontinence  Nurse Case Manager Clinical Goal(s):  [name] Over the next 60 days, patient will work with PCP to address needs related to urinary incontinence  CCM RN CM Interventions:  09/13/19 call completed with spouse [name]  . Evaluation of current treatment plan related to Urinary incontinence and patient's adherence to plan as established by provider. . Advised patient to report ongoing issues with urinary incontinence to Neurologist at next visit scheduled for 09/18/19 _0 :15 PM to determine if this issue is related to patient's chronic progressive  vascular dementia; Encouraged to also discuss with [name] at next OV scheduled for 01/17/19 to further evaluate and seek medical advise   . Collaborated with [name] regarding patient's worsening incontinence and behavioral changes . Discussed plans with patient for ongoing care  management follow up and provided patient with direct contact information for care management team . Reviewed and discussed patient's next OV with [name] set for 09/19/19 _0 :49 PM   Patient Self Care Activities:  . Unable to independently perform Self Care  Please see past updates related to this goal by clicking on the "Past Updates" button in the selected goal      . "His balance is much worse and he needs a walker"     Spouse states Current Barriers:  . Impaired gait/balance . High Risk for Falls . 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 . Newly diagnosed; Mild chronic microvascular ischemic changes  Nurse Case Manager Clinical Goal(s):  [name] Over the next 30 days, patient will work with the CCM team to address needs related to impaired gait disturbance and high risk for falls  Goal Met  . 08/02/19 Over the next 30 days, patient will complete outpatient PT and will be able to adhere to his prescribed HEP Goal Partially Met  CCM RN CM Interventions:  09/13/19 call completed with spouse Ms. [name]  . Received inbound call from spouse [name] . Evaluation of current treatment plan related to impaired physical mobility and gait disturbance and patient's adherence to plan as established by provider . Discussed Mr. Lamm has been d/c from outpatient PT and is not following his HEP . Discussed Ms. Alonna Buckler concerns regarding Mr. Hehir ongoing issues with poor gait and balance . Encouraged [name] to report her concerns to Dr. Krista Blue at next appointment scheduled for 09/18/19 _1 :15 as this decline may indicative of his chronic progressive dementia . Discussed the importance of keeping PCP [name] well informed as well and to report any/all falls promptly if they occur . Encouraged [name] to encourage the use of Mr. Mathe using his rollator at all times when ambulating and to have him wear good supportive shoes  to help reduce the risk of falling . Discussed plans with spouse for ongoing care management follow up and confirmed [name] having the direct contact information for care management team  Patient Self Care Activities:  . Currently UNABLE TO independently perform self-care without assistance   Please see past updates related to this goal by clicking on the "Past Updates" button in the selected goal      . "I am having right ear pain that radiates to the back of my head when I ride my exercise bike" (pt-stated)     Current Barriers:  [name] Knowledge Deficits related to evaluation and treatment of right ear and head pain   Nurse Case Manager Clinical Goal(s):  [name] Over the next 30 days, patient will work with PCP provider  to address needs related to diagnosis and treatment of right ear/head pain  CCM RN CM Interventions:  10/09/19 call completed with patient and spouse   . Evaluation of current treatment plan related to right ear pain and headache and patient's adherence to plan as established by provider. . Advised patient to arrive at the [facility] office tomorrow, 10/10/19 @ 10:45 AM for evaluation and treatment of right ear pain and headache  . Collaborated with PCP [name] regarding patient's c/o right  ear pain and headache when riding his exercise bike; advised the patient reports this problem started about 2 days ago and resolves with rest or lying his head down . Discussed plans with patient for ongoing care management follow up and provided patient with direct contact information for care management team . Reviewed scheduled/upcoming provider appointments including: OV with provider Audery Amel PA-C set for 10/10/19 _0 :45 AM   Patient Self Care Activities:   . Unable to self administer medications as prescribed . Unable to perform ADLs independently . Unable to perform IADLs independently  Initial goal documentation     . "I don't know what the next steps are for Mr.  Bohlman Dementia"     Spouse stated  Current Barriers:  [name] Knowledge Deficits related to next steps for diagnosis and treatment for new diagnosis of Dementia  Nurse Case Manager Clinical Goal(s):  [name] Over the next 14 days, spouse will collaborative with the Neuro office and receive instructions regarding brain imaging ordered for determination of type of Dementia - Goal Met  . Over the next 90 days, patient will verbalize understanding of plan for treatment management for new dx: Dementia  CCM RN CM Interventions:  09/13/19: call completed with spouse [name]  . Inbound call received from spouse [name] to discuss ongoing concerns about Mr. Mollett dementia  . Discussed Mr. Reinard is having some bizarre behaviors; she reports he was noted to be walking around the house unclothed and was unaware that he was not fully dressed  . Reinforced the importance of [name] reporting patient's symptoms to his treating Neurologist, Dr. Krista Blue; discussed patient has a follow up appointment with [name] Neuro DNP on 09/18/19 at 3:15 PM; as well as PCP [name] to keep her informed of patient's Neuro status and prescribed plan of care recommended by Neuro  . Discussed [name] will contact Dr. Rhea Belton office a day ahead of Mr. Bracewell OV to report her concerns in private w/o causing Mr. Craighead to become angry and irritated if discussed in person . Sent in basket message to [name] to advise of spouse's reported symptoms . Discussed plans with [name] for ongoing care management follow up and confirmed she has the direct contact information for care management team  06/10/19 Reviewed brain CT image results from 06/07/19 as ordered by Neurologist, Dr. Krista Blue  Noted the following IMPRESSION:  This CT scan of the head without contrast shows the following: 1.   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 2.   Mild chronic microvascular ischemic  changes. 3.   There are no acute findings.  Results not discussed with spouse today. Per [name] she will call to schedule a follow up with the Dr. Krista Blue to review the CT results.   Patient Self Care Activities with assistance/supervision from spouse . Self administers medications as prescribed . Attends all scheduled provider appointments . Performs ADL's independently . Performs IADL's independently  Please see past updates related to this goal by clicking on the "Past Updates" button in the selected goal      . I would like to continue to optimize his medication management of my chronic conditions.     Spouse stated Current Barriers:  . Polypharmacy; complex patient with multiple comorbidities including dementia, diabetes, hypertension . Wife assists with managing medications via [employer] pill packaging system.  Patient continues to take pills out and sometimes does not take them.  Pharmacist Clinical Goal(s):  .  Over the next 90 days, patient will work with PharmD and provider towards optimized medication management  Interventions: 10/09/19 completed clinic visit with patient and spouse . Comprehensive medication review performed; medication list updated in electronic medical record . Counseled wife on working to oversee pill pack administration as patient takes pills out at times and forgets to take them.  She continues to give patient the Namenda twice daily.  She has not seen a great benefit from this. . Reviewed & discussed the following diabetes-related information with patient: o Continue checking blood sugars as directed (twice daily if able) o Follow ADA recommended "diabetes-friendly" diet  (reviewed healthy snack/food options); Patient continues to eat desserts.  Wife states she is trying to limit, however patient is headstrong at times. Discussed with her the likelihood of having to add more antidiabetes medications. o Patients BG was checked today in clinic = 187 (3 hrs  after smoothie this AM) - New one touch verio flex meter was set up with patient and wife at visit in 10/09/19 - Will make sure lancets and test strips are called in for patient o Confirmed current DM regimen: Metformin once daily, Trulicity weekly (actos d/c'd due to edema/CKD) o Discussed GLP-1 injection technique.  Patient is self-administering per wife. o Reviewed medication purpose/side effects-->wife denies adverse events, denies hypoglycemia o Most recent A1c is 10.5% on 09/29/19 (continues to increase: 8.2% on 6/23, 7.5% on 3/23.  Wife working to give medications as prescribed, but receives resistance from patient as he is battling with dementia.  They are using compliance packaging by [facility] [employer] mail order.  This method appears to be the best method of organization/compliance. o Current anti-hypertensive regimen: carvedilol, amlodipine, valsartan o Current anti-hyperlipidemia regimen: pravastatin '20mg'$  daily o Continue taking all medications as prescribed by provider o Patient now has all medications in pill packs as ordered at last CCM PharmD visit   CCM RN CM Interventions:  10/08/19 completed call with spouse [name]  Inbound call received from spouse [name] . Evaluation of current treatment plan related to Diabetes and patient's adherence to plan as established by provider. . Advised patient to stop by the office to pick up his new glucometer; discussed the [facility] nurse will work with Mr. Cadavid and [name] on how to properly use the glucometer; discussed the embedded Pharm D Lottie Dawson is also available if needed and can scheduled a face to face OV if further instruction is needed . Discussed plans with patient for ongoing care management follow up and provided patient with direct contact information for care management team . Advised patient, providing education and rationale, to check cbg daily before meals and record, calling the CCM team and or PCP for findings  outside established parameters.   . Discussed Mr. Mehringer is riding his exercise bike daily in the evenings and is feeling better overall . Discussed [name] is waking him earlier to ensure he is checking his CBG, eating breakfast and taking his am medications . Discussed meal preparation and dinner time is now earlier in the evening - educated [name] on the importance of closely monitoring Mr. Loiseau CBG's at home due to adding exercise to his daily routine can lower his CBG's - educated [name] on measures to take for hypoglycemia  . Mailed printed signs/symptoms hypo/hyperglycemia reference sheet  Patient Self Care Activities:  . Patient will take medications as prescribed with help from wife and pill packaging system . Patient will focus on improved adherence  by allowing wife to assist with pill pack administration  Please see past updates related to this goal by clicking on the "Past Updates" button in the selected goal      . Patient Stated     10/17/2019, wants to move better       Fall Risk Fall Risk  10/17/2019 10/10/2019 09/19/2019 07/24/2019 04/23/2019  Falls in the past year? 1 0 1 1 0  Comment lost balance - - - -  Number falls in past yr: 0 - 1 0 -  Injury with Fall? 0 - 0 0 -  Risk for fall due to : Impaired balance/gait;Medication side effect;Impaired mobility;History of fall(s) - - - -  Follow up Falls evaluation completed;Education provided;Falls prevention discussed - - - -   Is the patient's home free of loose throw rugs in walkways, pet beds, electrical cords, etc?   yes      Grab bars in the bathroom? yes      Handrails on the stairs?   yes      Adequate lighting?   yes  Timed Get Up and Go Performed: n/a  Depression Screen PHQ 2/9 Scores 10/17/2019 09/19/2019 07/24/2019 04/23/2019  PHQ - 2 Score 0 0 0 0  PHQ- 9 Score 3 - - -    Cognitive Function MMSE - Mini Mental State Exam 09/24/2019 05/22/2019  Not completed: (No Data) -  Orientation  to time 3 1  Orientation to Place 0 3  Registration 3 3  Attention/ Calculation 2 0  Recall 2 1  Language- name 2 objects 2 2  Language- repeat 0 0  Language- follow 3 step command 3 3  Language- read & follow direction 1 1  Write a sentence 0 0  Copy design 0 1  Total score 16 15     6CIT Screen 01/31/2019 10/04/2018  What Year? 4 points 0 points  What month? 0 points 0 points  What time? 0 points 0 points  Count back from 20 0 points 0 points  Months in reverse 4 points 4 points  Repeat phrase 0 points 2 points  Total Score 8 6    Immunization History  Administered Date(s) Administered  . Influenza, High Dose Seasonal PF 09/24/2018, 07/24/2019  . Influenza-Unspecified 07/17/2016  . Pneumococcal Conjugate-13 09/20/2016    Qualifies for Shingles Vaccine? yes  Screening Tests Health Maintenance  Topic Date Due  . PNA vac Low Risk Adult (2 of 2 - PPSV23) 10/16/2020 (Originally 09/20/2017)  . OPHTHALMOLOGY EXAM  12/05/2019  . HEMOGLOBIN A1C  03/18/2020  . FOOT EXAM  04/22/2020  . TETANUS/TDAP  11/25/2024  . INFLUENZA VACCINE  Completed   Cancer Screenings: Lung: Low Dose CT Chest recommended if Age 48-80 years, 30 pack-year currently smoking OR have quit w/in 15years. Patient does not qualify. Colorectal: not required  Additional Screenings:  Hepatitis C Screening:n/a      Plan:    Patient wants to be able to move around better. 6 CIT was not completed due to diagnosis of dementia.  I have personally reviewed and noted the following in the patient's chart:   . Medical and social history . Use of alcohol, tobacco or illicit drugs  . Current medications and supplements . Functional ability and status . Nutritional status . Physical activity . Advanced directives . List of other physicians . Hospitalizations, surgeries, and ER visits in previous 12 months . Vitals . Screenings to include cognitive, depression, and falls . Referrals and appointments  In  addition, I have reviewed and discussed with patient certain preventive protocols, quality metrics, and best practice recommendations. A written personalized care plan for preventive services as well as general preventive health recommendations were provided to patient.     Kellie Simmering, LPN  27/[id number]/1836

## 2019-10-17 NOTE — Progress Notes (Signed)
This visit occurred during the SARS-CoV-2 public health emergency.  Safety protocols were in place, including screening questions prior to the visit, additional usage of staff PPE, and extensive cleaning of exam room while observing appropriate contact time as indicated for disinfecting solutions.  Subjective:     Patient ID: Dylan [name]. , male    DOB: 08-Jun-1941 , 77 y.o.   MRN: [id number]   Chief Complaint  Patient presents with  . Annual Exam  . Diabetes  . Hypertension    HPI  He is here today for a full physical examination. He is accompanied by his partner. He has no specific concerns or complaints at this time. She reports that he is making better food choices and has been using stationary cycle.   Diabetes He presents for his follow-up diabetic visit. He has type 2 diabetes mellitus. There are no hypoglycemic associated symptoms. Pertinent negatives for diabetes include no blurred vision and no chest pain. There are no hypoglycemic complications. Diabetic complications include nephropathy. Risk factors for coronary artery disease include diabetes mellitus, dyslipidemia, hypertension, sedentary lifestyle, male sex and obesity. He is compliant with treatment some of the time. He is following a diabetic diet. He participates in exercise intermittently. His breakfast blood glucose is taken between 9-10 am. His breakfast blood glucose range is generally 140-180 mg/dl. An ACE inhibitor/angiotensin II receptor blocker is being taken. He does not see a podiatrist.Eye exam is current.  Hypertension This is a chronic problem. The current episode started more than 1 year ago. The problem has been gradually improving since onset. The problem is controlled. Pertinent negatives include no blurred vision or chest pain. Risk factors for coronary artery disease include diabetes mellitus, dyslipidemia, male gender, obesity and sedentary lifestyle. There is no history of sleep apnea.     Past  Medical History:  Diagnosis Date  . Anxiety   . Aortic stenosis    mild AS 11/24/16 (peak grad 24, mean grad 11) Dr. [name]  . CHB (complete heart block) ([place]) 07/2017  . 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 Diabetes mellitus without complication ([place])   . Gout   . Hyperlipemia   . Hypertension   . 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 Memory loss   . Peripheral arterial disease ([place])   . 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 Second degree AV block    Wenckebach; no indication for pacemaker as of 12/01/16 (Dr. [name])  . Vitamin B12 deficiency anemia 03/01/2018  . Vitamin D deficiency disease      Family History  Problem Relation Age of Onset  . Diabetes Mother   . Breast cancer Mother   . Arthritis Mother   . Hypertension Father   . Diabetes Sister   . Diabetes Brother   . Diabetes Maternal Grandmother   . Diabetes Brother   . Diabetes Brother   . Diabetes Brother   . Diabetes Sister   . Diabetes Sister   . Diabetes Sister      Current Outpatient Medications:  .  allopurinol (ZYLOPRIM) 100 MG tablet, TAKE 1 TABLET BY MOUTH EVERY DAY, Disp: 90 tablet, Rfl: 1 .  amLODipine (NORVASC) 5 MG tablet, TAKE 1 TABLET BY MOUTH DAILY, Disp: 30 tablet, Rfl: 5 .  Ascorbic Acid (VITAMIN C) 1000 MG tablet, Take 1,000 mg by mouth daily., Disp: , Rfl:  .  aspirin 81 MG tablet, Take 81 mg by mouth 3 (three) times a week. , Disp: ,  Rfl:  .  Blood Pressure KIT, Check blood pressure daily 2 hours or more after taking medication, Disp: 1 kit, Rfl: 0 .  carvedilol (COREG) 12.5 MG tablet, Take 1 tablet (12.5 mg total) by mouth 2 (two) times daily., Disp: 180 tablet, Rfl: 3 .  Cholecalciferol (VITAMIN D3) 5000 units CAPS, Take 5,000 Units by mouth every evening., Disp: , Rfl:  .  Coenzyme Q10 200 MG capsule, Take 200 mg by mouth daily., Disp: , Rfl:  .  COLCRYS 0.6 MG tablet, Take 1 tablet (0.6 mg total) by mouth daily. (Patient taking differently:  Take 0.6 mg by mouth as needed. ), Disp: 90 tablet, Rfl: 1 .  cyclobenzaprine (FLEXERIL) 5 MG tablet, Take 5-10 mg by mouth at bedtime as needed for muscle spasms., Disp: , Rfl:  .  diclofenac Sodium (VOLTAREN) 1 % GEL, Apply 2 g topically 3 (three) times daily., Disp: 150 g, Rfl: 0 .  glucose blood (ONETOUCH VERIO) test strip, Use as instructed to check blood sugars daily dx: e11.22, Disp: 150 each, Rfl: 3 .  memantine (NAMENDA) 10 MG tablet, Take 1 tablet (10 mg total) by mouth 2 (two) times daily., Disp: 180 tablet, Rfl: 4 .  metFORMIN (GLUCOPHAGE) 1000 MG tablet, Take one tablet by mouth daily., Disp: 90 tablet, Rfl: 0 .  methocarbamol (ROBAXIN) 500 MG tablet, Take 500 mg by mouth daily as needed for muscle spasms., Disp: , Rfl:  .  OneTouch Delica Lancets 37C MISC, Use as instructed to check blood sugars daily dx: e11.22, Disp: 150 each, Rfl: 3 .  pravastatin (PRAVACHOL) 20 MG tablet, TAKE 1 TABLET BY ORAL ROUTE EVERY DAY, Disp: 30 tablet, Rfl: 2 .  TRULICITY 1.5 WU/8.8BV SOPN, INJECT 1.5 MG INTO THE SKIN ONCE A WEEK., Disp: 12 pen, Rfl: 1 .  valsartan (DIOVAN) 320 MG tablet, TAKE 1 TABLET BY MOUTH EVERY DAY, Disp: 30 tablet, Rfl: 8   No Known Allergies   Men's preventive visit. Patient Health Questionnaire (PHQ-2) is    Clinical Support from 10/17/2019 in [facility]  PHQ-2 Total Score  0    . Patient is on a healthy diet. Marital status: Married. Relevant history for alcohol use is:  Social History   Substance and Sexual Activity  Alcohol Use No  . Relevant history for tobacco use is:  Social History   Tobacco Use  Smoking Status Former Smoker  . Packs/day: 0.50  . Years: 7.00  . Pack years: 3.50  Smokeless Tobacco Never Used  Tobacco Comment   quit 35 years  .  Review of Systems  Constitutional: Negative.   HENT: Negative.   Eyes: Negative.  Negative for blurred vision.  Respiratory: Negative.   Cardiovascular: Negative.  Negative for chest  pain.  Endocrine: Negative.   Genitourinary: Negative.   Musculoskeletal: Negative.   Skin: Negative.   Allergic/Immunologic: Negative.   Neurological: Negative.   Hematological: Negative.   Psychiatric/Behavioral: Negative.      Today's Vitals   10/17/19 1036  BP: 126/76  Pulse: 90  Temp: 98.2 F (36.8 C)  TempSrc: Oral  Weight: 190 lb 6.4 oz (86.4 kg)  Height: 5' 8.2" (1.732 m)   Body mass index is 28.78 kg/m.   Objective:  Physical Exam Vitals and nursing note reviewed.  Constitutional:      Appearance: Normal appearance.  HENT:     Head: Normocephalic and atraumatic.     Right Ear: Ear canal and external ear normal. There is impacted cerumen.  Left Ear: Ear canal and external ear normal. There is impacted cerumen.     Ears:     Comments: Hard, impacted cerumen noted bilaterally.    Nose:     Comments: Deferred, masked    Mouth/Throat:     Comments: Deferred. masked Eyes:     Extraocular Movements: Extraocular movements intact.     Conjunctiva/sclera: Conjunctivae normal.     Pupils: Pupils are equal, round, and reactive to light.  Cardiovascular:     Rate and Rhythm: Normal rate and regular rhythm.     Pulses:          Dorsalis pedis pulses are 1+ on the right side and 1+ on the left side.     Heart sounds: Normal heart sounds.  Pulmonary:     Effort: Pulmonary effort is normal.     Breath sounds: Normal breath sounds.  Chest:     Breasts:        Right: Normal. No swelling, bleeding, inverted nipple, mass or nipple discharge.        Left: Normal. No swelling, bleeding, inverted nipple, mass or nipple discharge.  Abdominal:     General: Bowel sounds are normal.     Palpations: Abdomen is soft.     Comments: Rounded, soft  Genitourinary:    Comments: Deferred, per patient's request Musculoskeletal:        General: Normal range of motion.     Cervical back: Normal range of motion and neck supple.  Feet:     Right foot:     Protective Sensation: 5  sites tested. 5 sites sensed.     Skin integrity: Callus and dry skin present.     Toenail Condition: Right toenails are abnormally thick.     Left foot:     Protective Sensation: 5 sites tested. 5 sites sensed.     Skin integrity: Callus and dry skin present.     Toenail Condition: Left toenails are abnormally thick.  Skin:    General: Skin is warm.  Neurological:     General: No focal deficit present.     Mental Status: He is alert.     Comments: Appears to have shuffling gait.   Psychiatric:        Mood and Affect: Mood normal.        Behavior: Behavior normal.         Assessment And Plan:     1. Routine general medical examination at health care facility  A full exam was performed.  DRE deferred, per his request. .PATIENT HAS BEEN ADVISED TO GET 30-45 MINUTES REGULAR EXERCISE NO LESS THAN FOUR TO FIVE DAYS PER WEEK - BOTH WEIGHTBEARING EXERCISES AND AEROBIC ARE RECOMMENDED.  HE IS ADVISED TO FOLLOW A HEALTHY DIET WITH AT LEAST SIX FRUITS/VEGGIES PER DAY, DECREASE INTAKE OF RED MEAT, AND TO INCREASE FISH INTAKE TO TWO DAYS PER WEEK.  MEATS/FISH SHOULD NOT BE FRIED, BAKED OR BROILED IS PREFERABLE.  I SUGGEST WEARING SPF 50 SUNSCREEN ON EXPOSED PARTS AND ESPECIALLY WHEN IN THE DIRECT SUNLIGHT FOR AN EXTENDED PERIOD OF TIME.  PLEASE AVOID FAST FOOD RESTAURANTS AND INCREASE YOUR WATER INTAKE.   2. Type 2 diabetes mellitus with stage 3a chronic kidney disease, without long-term current use of insulin ([place])  Diabetic foot exam was performed. I DISCUSSED WITH THE PATIENT AT LENGTH REGARDING THE GOALS OF GLYCEMIC CONTROL AND POSSIBLE LONG-TERM COMPLICATIONS.  I  ALSO STRESSED THE IMPORTANCE OF COMPLIANCE WITH HOME GLUCOSE MONITORING, DIETARY RESTRICTIONS  INCLUDING AVOIDANCE OF SUGARY DRINKS/PROCESSED FOODS,  ALONG WITH REGULAR EXERCISE.  I  ALSO STRESSED THE IMPORTANCE OF ANNUAL EYE EXAMS, SELF FOOT CARE AND COMPLIANCE WITH OFFICE VISITS.  - EKG 12-Lead - BMP8+EGFR  3. Hypertensive  nephropathy  Chronic, well controlled. She will continue with current meds. He is encouraged to avoid adding salt to his foods. EKG performed, pacemaker ECG. Importance of regular exercise was discussed with the patient. He was congratulated for using stationery cycle more frequently. He is encouraged to aim for 30 minutes four to five days weekly.   4. Bilateral impacted cerumen  AFTER OBTAINING VERBAL CONSENT, BOTH EARS WERE FLUSHED BY IRRIGATION. HE TOLERATED PROCEDURE WELL WITHOUT ANY COMPLICATIONS. NO TM ABNORMALITIES WERE NOTED.  - Ear Lavage        Maximino Greenland, MD    THE PATIENT IS ENCOURAGED TO PRACTICE SOCIAL DISTANCING DUE TO THE COVID-19 PANDEMIC.

## 2019-10-17 NOTE — Patient Instructions (Signed)

## 2019-10-18 LAB — BMP8+EGFR
BUN/Creatinine Ratio: 12 (ref 10–24)
BUN: 17 mg/dL (ref 8–27)
CO2: 21 mmol/L (ref 20–29)
Calcium: 9.7 mg/dL (ref 8.6–10.2)
Chloride: 103 mmol/L (ref 96–106)
Creatinine, Ser: 1.47 mg/dL — ABNORMAL HIGH (ref 0.76–1.27)
GFR calc Af Amer: 52 mL/min/{1.73_m2} — ABNORMAL LOW (ref >59)
GFR calc non Af Amer: 45 mL/min/{1.73_m2} — ABNORMAL LOW (ref >59)
Glucose: 193 mg/dL — ABNORMAL HIGH (ref 65–99)
Potassium: 4.4 mmol/L (ref 3.5–5.2)
Sodium: 141 mmol/L (ref 134–144)

## 2019-10-20 ENCOUNTER — Other Ambulatory Visit: Payer: Self-pay

## 2019-10-20 ENCOUNTER — Ambulatory Visit (INDEPENDENT_AMBULATORY_CARE_PROVIDER_SITE_OTHER): Payer: Medicare HMO

## 2019-10-20 ENCOUNTER — Encounter (HOSPITAL_COMMUNITY): Payer: Self-pay

## 2019-10-20 ENCOUNTER — Ambulatory Visit (HOSPITAL_COMMUNITY)
Admission: EM | Admit: 2019-10-20 | Discharge: 2019-10-20 | Disposition: A | Discharge status: Home / Self Care | Payer: Medicare HMO | Attending: Family Medicine | Admitting: Family Medicine

## 2019-10-20 DIAGNOSIS — W19XXXA Unspecified fall, initial encounter: Secondary | ICD-10-CM

## 2019-10-20 DIAGNOSIS — S20212A Contusion of left front wall of thorax, initial encounter: Secondary | ICD-10-CM

## 2019-10-20 DIAGNOSIS — S6292XA Unspecified fracture of left wrist and hand, initial encounter for closed fracture: Secondary | ICD-10-CM

## 2019-10-20 DIAGNOSIS — M545 Low back pain, unspecified: Secondary | ICD-10-CM

## 2019-10-20 DIAGNOSIS — Y92009 Unspecified place in unspecified non-institutional (private) residence as the place of occurrence of the external cause: Secondary | ICD-10-CM

## 2019-10-20 MED ORDER — TRAMADOL HCL 50 MG PO TABS: 50.0000 mg | ORAL_TABLET | Freq: Four times a day (QID) | ORAL | 0 refills | Status: DC | PRN

## 2019-10-20 NOTE — ED Notes (Signed)
Ortho called for ulnar gutter splint

## 2019-10-20 NOTE — ED Provider Notes (Signed)
[name]    CSN: [id number] Arrival date & time: 10/20/19  1038      History   Chief Complaint Chief Complaint  Patient presents with  . Back Pain  . Fall    HPI Dylan [name]. is a 78 y.o. male.   HPI  Patient walks with a cane.  He is off balance.  While in his bedroom, he did not have his cane, and walked around the edge of the bed.  He fell on landed on his left side.  His ribs hit the edge of the bed frame.  He caught himself with his left hand.  He is here for left hand pain, left rib pain, and low back pain. He does have a history of a fall some years ago.  He had multiple fractured lumbar bodies and a kyphoplasty.  His wife is concerned about his back pain. He is complaining that his left lower ribs hurt when he laughs, coughs, or takes a deep breath.  No fever or sputum production. Swelling and pain in left hand over fourth and fifth fingers Patient has multiple medical problems.  He does have memory impairment.  Wife says that he is compliant with his medical care and his medicines. Specifically patient states that he lost his balance and fell.  He is certain that he did not pass out, blackout, or have any dizziness  Past Medical History:  Diagnosis Date  . Anxiety   . Aortic stenosis    mild AS 11/24/16 (peak grad 24, mean grad 11) Dr. [name]  . CHB (complete heart block) ([place]) 07/2017  . 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 Diabetes mellitus without complication ([place])   . Gout   . Hyperlipemia   . Hypertension   . 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 Memory loss   . Peripheral arterial disease ([place])   . 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 Second degree AV block    Wenckebach; no indication for pacemaker as of 12/01/16 (Dr. [name])  . Vitamin B12 deficiency anemia 03/01/2018  . Vitamin D deficiency disease     Patient Active Problem List   Diagnosis Date Noted  . 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 Dementia with behavioral disturbance ([place]) 05/22/2019  . Gait abnormality 05/22/2019  . Acute kidney injury (nontraumatic) ([place]) 07/14/2018  . Nephropathy 07/14/2018  . Other disorders of lung 07/14/2018  . Atherosclerosis of aorta ([place]) 07/14/2018  . Dehydration 07/14/2018  . 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 CKD (chronic kidney disease), stage III 06/13/2018  . Syncope 06/13/2018  . 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[place]) 07/11/2017  . 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[place]) 06/23/2015  . Peripheral arterial disease ([place]) 05/13/2014  . Essential hypertension 05/13/2014  . Diabetes mellitus type II, non insulin dependent ([place]) 05/13/2014  . Second degree AV block 05/13/2014    Past Surgical History:  Procedure Laterality Date  . CARDIOVASCULAR STRESS TEST     11/21/16 Low risk study, EF 52% [facility])  . EYE SURGERY    . KYPHOPLASTY N/A 02/15/2017   Procedure: LUMBAR FOUR KYPHOPLASTY;  Surgeon: Phylliss Bob, MD;  Location: [place];  Service: Orthopedics;  Laterality: N/A;  . lipoma surgery     neck - 30 years ago  . PACEMAKER IMPLANT N/A 07/11/2017   Procedure: Pacemaker Implant;  Surgeon: Constance Haw, MD;  Location: [place] CV [facility];  Service: Cardiovascular;  Laterality: N/A;  . TRANSTHORACIC ECHOCARDIOGRAM  11/24/16 [facility] CV): EF 55-60%, mild AS, mild-mod MR, mod TR, moderate pulm HTN, PAP 49 mmHg       Home Medications    Prior to Admission medications   Medication Sig Start Date End Date Taking? Authorizing Provider  allopurinol (ZYLOPRIM) 100 MG tablet TAKE 1 TABLET BY MOUTH EVERY DAY 08/13/19   [name], MD  amLODipine (NORVASC) 5 MG tablet TAKE 1 TABLET BY MOUTH DAILY 08/29/19   [name], MD  Ascorbic Acid (VITAMIN C) 1000 MG tablet Take 1,000 mg by mouth daily.    [provider]  aspirin 81 MG tablet Take 81 mg by mouth 3 (three) times a week.     [provider]  Blood Pressure KIT  Check blood pressure daily 2 hours or more after taking medication 10/10/19   Rodriguez-[place], Sunday Spillers, PA-C  carvedilol (COREG) 12.5 MG tablet Take 1 tablet (12.5 mg total) by mouth 2 (two) times daily. 08/07/19   [name], MD  Cholecalciferol (VITAMIN D3) 5000 units CAPS Take 5,000 Units by mouth every evening.    [provider]  Coenzyme Q10 200 MG capsule Take 200 mg by mouth daily.    [provider]  COLCRYS 0.6 MG tablet Take 1 tablet (0.6 mg total) by mouth daily. Patient taking differently: Take 0.6 mg by mouth as needed.  10/12/18   [name], MD  diclofenac Sodium (VOLTAREN) 1 % GEL Apply 2 g topically 3 (three) times daily. 10/10/19   Rodriguez-Southworth, Sunday Spillers, PA-C  glucose blood (ONETOUCH VERIO) test strip Use as instructed to check blood sugars daily dx: e11.22 10/14/19   [name], MD  memantine (NAMENDA) 10 MG tablet Take 1 tablet (10 mg total) by mouth 2 (two) times daily. 08/07/19   [name], MD  metFORMIN (GLUCOPHAGE) 1000 MG tablet Take one tablet by mouth daily. 08/07/19   [name], MD  OneTouch Delica Lancets 63J MISC Use as instructed to check blood sugars daily dx: e11.22 10/14/19   [name], MD  pravastatin (PRAVACHOL) 20 MG tablet TAKE 1 TABLET BY ORAL ROUTE EVERY DAY 08/29/19   [name], MD  traMADol (ULTRAM) 50 MG tablet Take 1 tablet (50 mg total) by mouth every 6 (six) hours as needed. 10/20/19   Raylene Ever[place], MD  TRULICITY 1.5 SH/7.77YO SOPN INJECT 1.5 MG INTO THE SKIN ONCE A WEEK. 09/10/19   [name], MD  valsartan (DIOVAN) 320 MG tablet TAKE 1 TABLET BY MOUTH EVERY DAY 08/29/19   [name], MD    Family History Family History  Problem Relation Age of Onset  . Diabetes Mother   . Breast cancer Mother   . Arthritis Mother   . Hypertension Father   . Diabetes Sister   . Diabetes Brother   . Diabetes Maternal Grandmother   . Diabetes Brother   . Diabetes Brother   . Diabetes  Brother   . Diabetes Sister   . Diabetes Sister   . Diabetes Sister     Social History Social History   Tobacco Use  . Smoking status: Former Smoker    Packs/day: 0.50    Years: 7.00    Pack years: 3.50  . Smokeless tobacco: Never Used  . Tobacco comment: quit 35 years  Substance Use Topics  . Alcohol use: No  . Drug use: No     Allergies   Patient has no known allergies.   Review of Systems Review of Systems  Constitutional: Negative for chills and fever.  HENT: Negative for congestion and hearing loss.   Eyes: Negative for pain.  Respiratory: Negative for cough and shortness of breath.   Cardiovascular: Positive for chest pain. Negative for leg swelling.       Chest wall pain  Gastrointestinal: Negative for abdominal pain, constipation and diarrhea.  Genitourinary: Negative for dysuria and frequency.  Musculoskeletal: Positive for arthralgias, back pain and gait problem. Negative for myalgias.  Neurological: Positive for weakness. Negative for dizziness, seizures and headaches.       Chronic balance disorder  Psychiatric/Behavioral: The patient is not nervous/anxious.      Physical Exam Triage Vital Signs ED Triage Vitals  Enc Vitals Group     BP 10/20/19 1105 (!) 161/67     Pulse Rate 10/20/19 1105 88     Resp 10/20/19 1105 18     Temp 10/20/19 1105 98.1 F (36.7 C)     Temp Source 10/20/19 1105 Oral     SpO2 10/20/19 1105 98 %     Weight 10/20/19 1103 190 lb (86.2 kg)     Height --      Head Circumference --      Peak Flow --      Pain Score 10/20/19 1103 8     Pain Loc --      Pain Edu? --      Excl. in [place]? --    No data found.  Updated Vital Signs BP (!) 161/67 (BP Location: Right Arm)   Pulse 88   Temp 98.1 F (36.7 C) (Oral)   Resp 18   Wt 86.2 kg   SpO2 98%   BMI 28.72 kg/m     Physical Exam Constitutional:      General: He is not in acute distress.    Appearance: He is well-developed. He is obese.     Comments: Slow responses.   Wife answers for him.  HENT:     Head: Normocephalic and atraumatic.     Mouth/Throat:     Comments: Mask in place Eyes:     Conjunctiva/sclera: Conjunctivae normal.     Pupils: Pupils are equal, round, and reactive to light.  Cardiovascular:     Rate and Rhythm: Normal rate and regular rhythm.     Heart sounds: Normal heart sounds.  Pulmonary:     Effort: Pulmonary effort is normal. No respiratory distress.     Breath sounds: Normal breath sounds.     Comments: Tenderness in the midaxillary line at the lower rib border.  No bruising is seen Chest:     Chest wall: Tenderness present.  Abdominal:     General: There is no distension.     Palpations: Abdomen is soft.  Musculoskeletal:        General: Normal range of motion.     Cervical back: Normal range of motion.     Comments: Tender along the central lumbar spine.  No muscular pain or spasm. Tender fifth finger at the MCP joint.  Limited movement.  Swelling noted  Skin:    General: Skin is warm and dry.  Neurological:     General: No focal deficit present.     Mental Status: He is alert.  Psychiatric:     Comments: Patient has Alzheimer's/memory impairment      UC Treatments / Results  Labs (all labs ordered are listed, but only abnormal results are displayed) Labs Reviewed - No data to display  EKG   Radiology DG Ribs Unilateral W/Chest Left  Result Date: 10/20/2019 CLINICAL DATA:  Acute LEFT chest and rib pain following fall 2 days ago. Initial encounter. EXAM: LEFT RIBS AND CHEST - 3+ VIEW COMPARISON:  06/29/2018 and prior radiographs FINDINGS: No fracture or other bone lesions are seen involving the ribs. There is no evidence of pneumothorax or pleural effusion. Both lungs are clear. A LEFT-sided pacemaker is again noted. Heart size and mediastinal contours are within normal limits. IMPRESSION: No acute abnormality. Electronically Signed   By: Margarette Canada M.D.   On: 10/20/2019 11:58   DG Lumbar Spine 2-3  Views  Result Date: 10/20/2019 CLINICAL DATA:  Acute low back pain following fall 2 days ago. Initial encounter. EXAM: LUMBAR SPINE - 2-3 VIEW COMPARISON:  07/15/2019 FINDINGS: No acute fracture or subluxation. Compression fractures of L3 and L4 are unchanged as well as L4 vertebral augmentation changes. Mild multilevel degenerative disc disease and facet arthropathy noted. No focal bony lesions are identified. IMPRESSION: 1. No evidence of acute bony abnormality. 2. L3 and L4 compression fractures again noted with L4 vertebral augmentation changes. Electronically Signed   By: Margarette Canada M.D.   On: 10/20/2019 12:02   DG Hand Complete Left  Result Date: 10/20/2019 CLINICAL DATA:  Acute LEFT hand pain following fall 2 days ago. Initial encounter. EXAM: LEFT HAND - COMPLETE 3+ VIEW COMPARISON:  None. FINDINGS: A nondisplaced oblique fracture of the proximal aspect of the little finger proximal phalanx is noted. No other acute fracture, subluxation or dislocation identified. Moderate degenerative changes in the wrist noted. Vascular calcifications are present. IMPRESSION: 1. Nondisplaced oblique fracture of the little finger proximal phalanx. 2. No other acute bony abnormalities. Electronically Signed   By: Margarette Canada M.D.   On: 10/20/2019 12:00    Procedures Procedures (including critical care time)  Medications Ordered in UC Medications - No data to display  Initial Impression / Assessment and Plan / UC Course  I have reviewed the triage vital signs and the nursing notes.  Pertinent labs & imaging results that were available during my care of the patient were reviewed by me and considered in my medical decision making (see chart for details).  Clinical Course as of Oct 20 1307  Sun Oct 20, 2019  1205 DG Lumbar Spine 2-3 Views [YN]    Clinical Course User Index [YN] Raylene [place], MD     Final Clinical Impressions(s) / UC Diagnoses   Final diagnoses:  Closed fracture of left  hand, initial encounter  Rib contusion, left, initial encounter  Acute midline low back pain without sciatica  Fall in home, initial encounter     Discharge Instructions     Ice and elevation will help with pain and swelling in the hand Call the orthopedic office tomorrow for an appointment Activity as tolerated Call your PCP for problems with ribs or back pain   ED Prescriptions    Medication Sig Dispense Auth. Provider   traMADol (ULTRAM) 50 MG tablet Take 1 tablet (50 mg total) by mouth every 6 (six) hours as needed. 15 tablet Raylene [place]ts, MD     I have reviewed the PDMP during this encounter.   Raylene [place], MD 10/20/19 [phone]

## 2019-10-20 NOTE — Discharge Instructions (Addendum)
Ice and elevation will help with pain and swelling in the hand Call the orthopedic office tomorrow for an appointment Activity as tolerated Call your PCP for problems with ribs or back pain

## 2019-10-20 NOTE — ED Triage Notes (Signed)
Pt states he fell 2 days ago on his back and left hand. Pt was walking around the end of the baed and tripped and fell.

## 2019-10-21 ENCOUNTER — Telehealth: Payer: Medicare HMO

## 2019-10-22 ENCOUNTER — Encounter: Payer: Self-pay | Admitting: Internal Medicine

## 2019-10-22 ENCOUNTER — Ambulatory Visit: Payer: Self-pay

## 2019-10-22 DIAGNOSIS — N183 Chronic kidney disease, stage 3 unspecified: Secondary | ICD-10-CM

## 2019-10-22 DIAGNOSIS — I10 Essential (primary) hypertension: Secondary | ICD-10-CM

## 2019-10-22 DIAGNOSIS — N1831 Chronic kidney disease, stage 3a: Secondary | ICD-10-CM

## 2019-10-22 DIAGNOSIS — E1122 Type 2 diabetes mellitus with diabetic chronic kidney disease: Secondary | ICD-10-CM

## 2019-10-22 NOTE — Chronic Care Management (AMB) (Addendum)
  Chronic Care Management   Outreach Note  10/22/2019 Name: Ilias Stcharles. MRN: [id number] DOB: February 14, 1941  Referred by: [name], MD Reason for referral : Chronic Care Management (CCM RNCM Telephone Follow up)   Voice message received from spouse [name] requesting a return phone call. An unsuccessful telephone attempt was completed today. Mr. Serio was referred to the case management team by [name] MD for assistance with care management and care coordination.   Follow Up Plan: A HIPPA compliant phone message was left for the patient providing contact information and requesting a return call.  Telephone follow up appointment with care management team member scheduled for: 12/02/19  [name], RN, BSN, CCM Care Management Coordinator [facility]  Direct Phone: [phone]

## 2019-10-22 NOTE — Progress Notes (Signed)
PPM remote 

## 2019-10-23 ENCOUNTER — Ambulatory Visit: Payer: Medicare HMO | Admitting: Pharmacist

## 2019-10-23 DIAGNOSIS — N1831 Chronic kidney disease, stage 3a: Secondary | ICD-10-CM

## 2019-10-28 NOTE — Progress Notes (Signed)
  Chronic Care Management   Outreach Note  10/23/2019 Name: Dylan [name]. MRN: [id number] DOB: 03-22-1941  Referred by: [name], MD Reason for referral : Chronic Care Management   An unsuccessful telephone outreach was attempted today. The patient was referred to the case management team by for assistance with care management and care coordination.   Follow Up Plan: A HIPPA compliant phone message was left for the patient providing contact information and requesting a return call.  The care management team will reach out to the patient again over the next 14 days.   SIGNATURE Regina Eck, PharmD, BCPS Clinical Pharmacist, [facility] [place]: [phone]

## 2019-10-30 ENCOUNTER — Telehealth: Payer: Medicare HMO | Admitting: Pharmacist

## 2019-11-05 ENCOUNTER — Ambulatory Visit: Payer: Medicare HMO | Admitting: Cardiology

## 2019-11-05 ENCOUNTER — Encounter: Payer: Self-pay | Admitting: Cardiology

## 2019-11-05 ENCOUNTER — Other Ambulatory Visit: Payer: Self-pay

## 2019-11-05 VITALS — BP 136/68 | HR 71 | Ht 68.2 in | Wt 189.4 lb

## 2019-11-05 DIAGNOSIS — I442 Atrioventricular block, complete: Secondary | ICD-10-CM

## 2019-11-05 NOTE — Progress Notes (Signed)
Electrophysiology Office Note   Date:  11/05/2019   ID:  Dylan Ina., DOB [birth year], MRN [id number]  PCP:  [name], MD  Cardiologist:  [name] Primary Electrophysiologist:   Meredith Leeds, MD    No chief complaint on file.    History of Present Illness: Dylan Clymer. is a 79 y.o. male who is being seen today for the evaluation of complete AV block at the request of [name], MD. Presenting today for electrophysiology evaluation. He has a history of hypertension, hyperlipidemia, and diabetes with PAD and diabetic retinopathy. He's been having dyspnea and fatigue.  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  He has Environmental health practitioner dual-chamber pacemaker implanted 07/11/17.  Today, denies symptoms of palpitations, chest pain, shortness of breath, orthopnea, PND, lower extremity edema, claudication, dizziness, presyncope, syncope, bleeding, or neurologic sequela. The patient is tolerating medications without difficulties.  He is overall done well.  Unfortunately a few weeks ago, he had a fall and he broke his finger.  His hand is currently splinted.  Prior to the fall, he had an episode of syncope.  Device interrogation shows no evidence of arrhythmia.   Past Medical History:  Diagnosis Date  . Anxiety   . Aortic stenosis    mild AS 11/24/16 (peak grad 24, mean grad 11) Dr. [name]  . CHB (complete heart block) ([place]) 07/2017  . 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 Diabetes mellitus without complication ([place])   . Gout   . Hyperlipemia   . Hypertension   . 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 Memory loss   . Peripheral arterial disease ([place])   . 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 Second degree AV block    Wenckebach; no indication for pacemaker as of 12/01/16 (Dr. [name])  . Vitamin B12 deficiency anemia 03/01/2018  . Vitamin D deficiency disease    Past Surgical History:  Procedure Laterality Date  . CARDIOVASCULAR  STRESS TEST     11/21/16 Low risk study, EF 52% [facility])  . EYE SURGERY    . KYPHOPLASTY N/A 02/15/2017   Procedure: LUMBAR FOUR KYPHOPLASTY;  Surgeon: Phylliss Bob, MD;  Location: [place];  Service: Orthopedics;  Laterality: N/A;  . lipoma surgery     neck - 30 years ago  . PACEMAKER IMPLANT N/A 07/11/2017   Procedure: Pacemaker Implant;  Surgeon: Constance Haw, MD;  Location: [place] CV [facility];  Service: Cardiovascular;  Laterality: N/A;  . TRANSTHORACIC ECHOCARDIOGRAM     11/24/16 [facility]): EF 55-60%, mild AS, mild-mod MR, mod TR, moderate pulm HTN, PAP 49 mmHg     Current Outpatient Medications  Medication Sig Dispense Refill  . allopurinol (ZYLOPRIM) 100 MG tablet TAKE 1 TABLET BY MOUTH EVERY DAY 90 tablet 1  . amLODipine (NORVASC) 5 MG tablet TAKE 1 TABLET BY MOUTH DAILY 30 tablet 5  . Ascorbic Acid (VITAMIN C) 1000 MG tablet Take 1,000 mg by mouth daily.    [name] aspirin 81 MG tablet Take 81 mg by mouth 3 (three) times a week.     . Blood Pressure KIT Check blood pressure daily 2 hours or more after taking medication 1 kit 0  . carvedilol (COREG) 12.5 MG tablet Take 1 tablet (12.5 mg total) by mouth 2 (two) times daily. 180 tablet 3  . Cholecalciferol (VITAMIN D3) 5000 units CAPS Take 5,000 Units by mouth every evening.    . Coenzyme Q10 200 MG capsule Take  200 mg by mouth daily.    [name] COLCRYS 0.6 MG tablet Take 1 tablet (0.6 mg total) by mouth daily. 90 tablet 1  . diclofenac Sodium (VOLTAREN) 1 % GEL Apply 2 g topically 3 (three) times daily. 150 g 0  . glucose blood (ONETOUCH VERIO) test strip Use as instructed to check blood sugars daily dx: e11.22 150 each 3  . memantine (NAMENDA) 10 MG tablet Take 1 tablet (10 mg total) by mouth 2 (two) times daily. 180 tablet 4  . metFORMIN (GLUCOPHAGE) 1000 MG tablet Take one tablet by mouth daily. 90 tablet 0  . OneTouch Delica Lancets 17E MISC Use as instructed to check blood sugars daily dx: e11.22 150 each 3  .  pravastatin (PRAVACHOL) 20 MG tablet TAKE 1 TABLET BY ORAL ROUTE EVERY DAY 30 tablet 2  . traMADol (ULTRAM) 50 MG tablet Take 1 tablet (50 mg total) by mouth every 6 (six) hours as needed. 15 tablet 0  . TRULICITY 1.5 YC/1.4GY SOPN INJECT 1.5 MG INTO THE SKIN ONCE A WEEK. 12 pen 1  . valsartan (DIOVAN) 320 MG tablet TAKE 1 TABLET BY MOUTH EVERY DAY 30 tablet 8   No current facility-administered medications for this visit.    Allergies:   Patient has no known allergies.   Social History:  The patient  reports that he has quit smoking. He has a 3.50 pack-year smoking history. He has never used smokeless tobacco. He reports that he does not drink alcohol or use drugs.   Family History:  The patient's family history includes Arthritis in his mother; Breast cancer in his mother; Diabetes in his brother, brother, brother, brother, maternal grandmother, mother, sister, sister, sister, and sister; Hypertension in his father.   ROS:  Please see the history of present illness.   Otherwise, review of systems is positive for none.   All other systems are reviewed and negative.   PHYSICAL EXAM: VS:  BP 136/68   Pulse 71   Ht 5' 8.2" (1.732 m)   Wt 189 lb 6.4 oz (85.9 kg)   SpO2 98%   BMI 28.63 kg/m  , BMI Body mass index is 28.63 kg/m. GEN: Well nourished, well developed, in no acute distress  HEENT: normal  Neck: no JVD, carotid bruits, or masses Cardiac: RRR; no murmurs, rubs, or gallops,no edema  Respiratory:  clear to auscultation bilaterally, normal work of breathing GI: soft, nontender, nondistended, + BS MS: no deformity or atrophy  Skin: warm and dry, device site well healed Neuro:  Strength and sensation are intact Psych: euthymic mood, full affect  EKG:  EKG is ordered today. Personal review of the ekg ordered shows atrial sensed, ventricular paced  Personal review of the device interrogation today. Results in [place]: 01/31/2019: TSH 1.240 07/24/2019: ALT 25  10/17/2019: BUN 17; Creatinine, Ser 1.47; Potassium 4.4; Sodium 141    Lipid Panel     Component Value Date/Time   CHOL 129 07/24/2019 1228   TRIG 70 07/24/2019 1228   HDL 49 07/24/2019 1228   CHOLHDL 2.6 07/24/2019 1228   LDLCALC 66 07/24/2019 1228     Wt Readings from Last 3 Encounters:  11/05/19 189 lb 6.4 oz (85.9 kg)  10/20/19 190 lb (86.2 kg)  10/17/19 190 lb 6.4 oz (86.4 kg)      Other studies Reviewed: Additional studies/ records that were reviewed today include: TTE 11/24/16 Review of the above records today demonstrates:  Ejection fraction 55-60%, moderate LVH Left  atrium mild to moderately dilated right atrium moderately dilated right ventricle moderately dilated normal right ventricular function Mild to moderate mitral regurgitation Moderate tricuspid regurgitation PA systolic pressure 49 mmHg   Myoview 11/21/16 Ejection fraction 50%, normal perfusion     ASSESSMENT AND PLAN:  1.  Complete heart block: Status post Saint Jude dual-chamber pacemaker implanted 07/11/2017.  Functioning appropriately.  No changes.   2. Hypertension: Mildly elevated today but is usually much better controlled.  No changes.  3. Hyperlipidemia: Continue statin  4.  Paroxysmal atrial fibrillation: Not on device check.  Has had minimal atrial fibrillation.  No changes.  5.  Ventricular tachycardia: no furhter episodes  6.  Syncope4: Had an episode of syncope a few weeks ago.  Device interrogation shows no arrhythmias or pauses.  It is unclear to me as to the cause.  I have advised him of no driving for the next 6 months.  Current medicines are reviewed at length with the patient today.   The patient does not have concerns regarding his medicines.  The following changes were made today: None  Labs/ tests ordered today include:  No orders of the defined types were placed in this encounter.    Disposition:   FU with   12 months  Signed,  Meredith Leeds, MD   11/05/2019 12:44 PM     [address] [place] [id number] [phone] (office) [phone] (fax)

## 2019-11-08 ENCOUNTER — Ambulatory Visit: Payer: Medicare HMO

## 2019-11-08 DIAGNOSIS — N1831 Chronic kidney disease, stage 3a: Secondary | ICD-10-CM

## 2019-11-08 DIAGNOSIS — E1122 Type 2 diabetes mellitus with diabetic chronic kidney disease: Secondary | ICD-10-CM

## 2019-11-08 NOTE — Chronic Care Management (AMB) (Signed)
Chronic Care Management    Social Work Follow Up Note  11/08/2019 Name: Gillian Kluever. MRN: [id number] DOB: 26-May-1941  Joao Mccurdy. is a 79 y.o. year old male who is a primary care patient of [name], MD. The CCM team was consulted for assistance with care coordination.   Review of patient status, including review of consultants reports, other relevant assessments, and collaboration with appropriate care team members and the patient's provider was performed as part of comprehensive patient evaluation and provision of chronic care management services.    SW placed an outbound call to the patients caregiver [name] to assist with care coordination.  Outpatient Encounter Medications as of 11/08/2019  Medication Sig  . allopurinol (ZYLOPRIM) 100 MG tablet TAKE 1 TABLET BY MOUTH EVERY DAY  . amLODipine (NORVASC) 5 MG tablet TAKE 1 TABLET BY MOUTH DAILY  . Ascorbic Acid (VITAMIN C) 1000 MG tablet Take 1,000 mg by mouth daily.  [name] aspirin 81 MG tablet Take 81 mg by mouth 3 (three) times a week.   . Blood Pressure KIT Check blood pressure daily 2 hours or more after taking medication  . carvedilol (COREG) 12.5 MG tablet Take 1 tablet (12.5 mg total) by mouth 2 (two) times daily.  . Cholecalciferol (VITAMIN D3) 5000 units CAPS Take 5,000 Units by mouth every evening.  . Coenzyme Q10 200 MG capsule Take 200 mg by mouth daily.  [name] COLCRYS 0.6 MG tablet Take 1 tablet (0.6 mg total) by mouth daily.  . diclofenac Sodium (VOLTAREN) 1 % GEL Apply 2 g topically 3 (three) times daily.  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 memantine (NAMENDA) 10 MG tablet Take 1 tablet (10 mg total) by mouth 2 (two) times daily.  . metFORMIN (GLUCOPHAGE) 1000 MG tablet Take one tablet by mouth daily.  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 pravastatin (PRAVACHOL) 20 MG tablet TAKE 1  TABLET BY ORAL ROUTE EVERY DAY  . traMADol (ULTRAM) 50 MG tablet Take 1 tablet (50 mg total) by mouth every 6 (six) hours as needed.  . TRULICITY 1.5 XT/0.5WP SOPN INJECT 1.5 MG INTO THE SKIN ONCE A WEEK.  . valsartan (DIOVAN) 320 MG tablet TAKE 1 TABLET BY MOUTH EVERY DAY   No facility-administered encounter medications on file as of 11/08/2019.     Goals Addressed            This Visit's Progress   . I would like to continue to optimize his medication management of my chronic conditions.       Spouse stated Current Barriers:  . Polypharmacy; complex patient with multiple comorbidities including dementia, diabetes, hypertension . Wife assists with managing medications via [employer] pill packaging system.  Patient continues to take pills out and sometimes does not take them.  Pharmacist Clinical Goal(s):  [name] Over the next 90 days, patient will work with PharmD and provider towards optimized medication management  CCM SW Interventions: Completed 11/08/19 with [name] . Outbound call placed to [name] to assist with care coordination . Discussed [name] concern surrounding patient ability to use new glucometer o "He keeps using the old machine and I think he uses the same needle more than once because he cant get enough blood out of his finger sometimes" o Mrs Arsenio Loader requests in person follow up with PharmD to review instructions . Collaboration  with embedded PharmD regarding appointment availability o Scheduled office visit for Wednesday January 13 at 2:30 pm  Patient Self Care Activities:  . Patient will take medications as prescribed with help from wife and pill packaging system . Patient will focus on improved adherence by allowing wife to assist with pill pack administration  Please see past updates related to this goal by clicking on the "Past Updates" button in the selected goal          Follow Up Plan: Face to Face appointment with CCM team member scheduled  for: January 13 at 2:30 pm.  Daneen Schick, BSW, CDP Social Worker, Certified Dementia Practitioner [place] [facility] [phone]  Total time spent performing care coordination and/or care management activities with the patient by phone or face to face = 10 minutes.

## 2019-11-08 NOTE — Patient Instructions (Signed)
Social Worker Visit Information  Goals we discussed today:  Goals Addressed            This Visit's Progress   . I would like to continue to optimize his medication management of my chronic conditions.       Spouse stated Current Barriers:  . Polypharmacy; complex patient with multiple comorbidities including dementia, diabetes, hypertension . Wife assists with managing medications via [employer] pill packaging system.  Patient continues to take pills out and sometimes does not take them.  Pharmacist Clinical Goal(s):  [name] Over the next 90 days, patient will work with PharmD and provider towards optimized medication management  CCM SW Interventions: Completed 11/08/19 with [name] . Outbound call placed to [name] to assist with care coordination . Discussed [name] concern surrounding patient ability to use new glucometer o "He keeps using the old machine and I think he uses the same needle more than once because he cant get enough blood out of his finger sometimes" o Mrs Arsenio Loader requests in person follow up with PharmD to review instructions . Collaboration with embedded PharmD regarding appointment availability o Scheduled office visit for Wednesday January 13 at 2:30 pm  Patient Self Care Activities:  . Patient will take medications as prescribed with help from wife and pill packaging system . Patient will focus on improved adherence by allowing wife to assist with pill pack administration  Please see past updates related to this goal by clicking on the "Past Updates" button in the selected goal          Follow Up Plan: Face to Face appointment with CCM team member scheduled for: January 13 at 2:30 pm.  Daneen Schick, BSW, CDP Social Worker, Certified Dementia Practitioner [place] [facility] [phone]

## 2019-11-13 ENCOUNTER — Ambulatory Visit (INDEPENDENT_AMBULATORY_CARE_PROVIDER_SITE_OTHER): Payer: Medicare HMO | Admitting: Pharmacist

## 2019-11-13 ENCOUNTER — Other Ambulatory Visit: Payer: Self-pay

## 2019-11-13 DIAGNOSIS — E1122 Type 2 diabetes mellitus with diabetic chronic kidney disease: Secondary | ICD-10-CM

## 2019-11-13 DIAGNOSIS — N1831 Chronic kidney disease, stage 3a: Secondary | ICD-10-CM | POA: Diagnosis not present

## 2019-11-13 DIAGNOSIS — E1121 Type 2 diabetes mellitus with diabetic nephropathy: Secondary | ICD-10-CM | POA: Diagnosis not present

## 2019-11-13 MED ORDER — ONETOUCH DELICA LANCETS 33G MISC: 3 refills | Status: DC

## 2019-11-14 NOTE — Progress Notes (Signed)
Chronic Care Management   Visit Note  11/13/2019 Name: Dylan [name]. MRN: [id number] DOB: 01/21/1941  Referred by: Dylan Chard, MD Reason for referral : Chronic Care Management   Dylan Nee. is a 79 y.o. year old male who is a primary care patient of Dylan Chard, MD. 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     Medications: Outpatient Encounter Medications as of 11/13/2019  Medication Sig  . Lancet Devices (ONE TOUCH DELICA LANCING DEV) MISC 1 Device by Does not apply route.  [name] allopurinol (ZYLOPRIM) 100 MG tablet TAKE 1 TABLET BY MOUTH EVERY DAY  . amLODipine (NORVASC) 5 MG tablet TAKE 1 TABLET BY MOUTH DAILY  . Ascorbic Acid (VITAMIN C) 1000 MG tablet Take 1,000 mg by mouth daily.  [name] aspirin 81 MG tablet Take 81 mg by mouth 3 (three) times a week.   . Blood Pressure KIT Check blood pressure daily 2 hours or more after taking medication  . carvedilol (COREG) 12.5 MG tablet Take 1 tablet (12.5 mg total) by mouth 2 (two) times daily.  . Cholecalciferol (VITAMIN D3) 5000 units CAPS Take 5,000 Units by mouth every evening.  . Coenzyme Q10 200 MG capsule Take 200 mg by mouth daily.  [name] COLCRYS 0.6 MG tablet Take 1 tablet (0.6 mg total) by mouth daily.  . diclofenac Sodium (VOLTAREN) 1 % GEL Apply 2 g topically 3 (three) times daily.  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 memantine (NAMENDA) 10 MG tablet Take 1 tablet (10 mg total) by mouth 2 (two) times daily.  . metFORMIN (GLUCOPHAGE) 1000 MG tablet Take one tablet by mouth daily.  .  pravastatin (PRAVACHOL) 20 MG tablet TAKE 1 TABLET BY ORAL ROUTE EVERY DAY  . traMADol (ULTRAM) 50 MG tablet Take 1 tablet (50 mg total) by mouth every 6 (six) hours as needed.  . TRULICITY 1.5 ES/9.2ZR SOPN INJECT 1.5 MG INTO THE SKIN ONCE A WEEK.  . valsartan (DIOVAN) 320 MG tablet TAKE 1 TABLET BY MOUTH EVERY DAY  . 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     Objective:   Goals Addressed            This Visit's Progress   . I would like to continue to optimize his medication management of my chronic conditions.       Spouse stated Current Barriers:  . Polypharmacy; complex patient with multiple comorbidities including dementia, diabetes, hypertension . Wife assists with managing medications via [employer] pill packaging system.  Patient continues to take pills out and sometimes does not take them.  Pharmacist Clinical Goal(s):  [name] Over the next 90 days, patient will work with PharmD and provider towards optimized medication management  CCM SW Interventions: Completed 11/08/19 with Dylan [name] . Outbound call placed to Dylan [name] to assist with care coordination . Discussed Dylan [name] concern surrounding patient ability to use new glucometer o "He keeps using the old machine and I think he uses the same needle more than once  because he cant get enough blood out of his finger sometimes" o Dylan [name] requests in person follow up with PharmD to review instructions . Collaboration with embedded PharmD regarding appointment availability o Scheduled office visit for Wednesday January 13 at 2:30 pm  Interventions: 11/13/19 completed clinic visit  . Comprehensive medication review performed; medication list updated in electronic medical record . Counseled wife on working to oversee pill pack administration as patient takes pills out at times and  forgets to take them.  She continues to give patient the Namenda twice daily.  She has not seen a great benefit from this. . Reviewed & discussed the following diabetes-related information with patient: o Continue checking blood sugars as directed (twice daily if able) o Follow ADA recommended "diabetes-friendly" diet  (reviewed healthy snack/food options); Patient continues to eat desserts.  Wife states she is trying to limit, however patient is headstrong at times. Discussed with her the likelihood of having to add more antidiabetes medications. o Patients BG was checked today in clinic = 187 (3 hrs after smoothie this AM) - New one touch verio flex meter was set up again - Lancet drum was broken-new one ordered via mail order - Patient has test strips and lancets/ o Confirmed current DM regimen: Metformin once daily, Trulicity weekly (actos d/c'd due to edema/CKD) o Discussed GLP-1 injection technique.  Patient is self-administering per wife. o Reviewed medication purpose/side effects-->wife denies adverse events, denies hypoglycemia o Most recent A1c is 10.5% on 09/29/19 (continues to increase: 8.2% on 6/23, 7.5% on 3/23.  Wife working to give medications as prescribed, but receives resistance from patient as he is battling with dementia.  They are using compliance packaging by [facility] [employer] mail order.  This method appears to be the best method of organization/compliance. o Current anti-hypertensive regimen: carvedilol, amlodipine, valsartan o Current anti-hyperlipidemia regimen: pravastatin 34m daily o Continue taking all medications as prescribed by provider o Patient now has all medications in pill packs as ordered at last CCM PharmD visit   CCM RN CM Interventions:  10/08/19 completed call with spouse Dylan [name] Inbound call received from spouse Dylan [name]. Evaluation of current treatment plan related to Diabetes and patient's adherence to plan as established by  provider. . Advised patient to stop by the office to pick up his new glucometer; [facility] Mr. JMavisand [name] Loaderon how to properly use the glucometer; discussed the embedded Pharm D JLottie Dawsonis also available if needed and can scheduled a face to face OV if further instruction is needed . Discussed plans with patient for ongoing care management follow up and provided patient with direct contact information for care management team . Advised patient, providing education and rationale, to check cbg daily before meals and record, calling the CCM team and or PCP for findings outside established parameters.   . Discussed Mr. JNickolsonis riding his exercise bike daily in the evenings and is feeling better overall . Discussed [name] Loaderis waking him earlier to ensure he is checking his CBG, eating breakfast and taking his am medications . Discussed meal preparation and dinner time is now earlier in the evening - educated [name] Loaderon the importance of closely monitoring Mr. JMilerCBG's at home due to adding exercise to his daily routine can lower his CBG's - educated [name] Loaderon measures to take for hypoglycemia  . Mailed printed signs/symptoms hypo/hyperglycemia reference sheet  Patient Self Care Activities:  .  Patient will take medications as prescribed with help from wife and pill packaging system . Patient will focus on improved adherence by allowing wife to assist with pill pack administration  Please see past updates related to this goal by clicking on the "Past Updates" button in the selected goal          Plan:   The care management team will reach out to the patient again over the next 60 days.   Provider Signature Regina Eck, PharmD, BCPS Clinical Pharmacist, [facility] [place]: [phone]

## 2019-11-14 NOTE — Patient Instructions (Signed)
Visit Information  Goals Addressed            This Visit's Progress   . I would like to continue to optimize his medication management of my chronic conditions.       Spouse stated Current Barriers:  . Polypharmacy; complex patient with multiple comorbidities including dementia, diabetes, hypertension . Wife assists with managing medications via [employer] pill packaging system.  Patient continues to take pills out and sometimes does not take them.  Pharmacist Clinical Goal(s):  [name] Over the next 90 days, patient will work with PharmD and provider towards optimized medication management  CCM SW Interventions: Completed 11/08/19 with [name] . Outbound call placed to [name] to assist with care coordination . Discussed [name] concern surrounding patient ability to use new glucometer o "He keeps using the old machine and I think he uses the same needle more than once because he cant get enough blood out of his finger sometimes" o Mrs Arsenio Loader requests in person follow up with PharmD to review instructions . Collaboration with embedded PharmD regarding appointment availability o Scheduled office visit for Wednesday January 13 at 2:30 pm  Interventions: 11/13/19 completed clinic visit  . Comprehensive medication review performed; medication list updated in electronic medical record . Counseled wife on working to oversee pill pack administration as patient takes pills out at times and forgets to take them.  She continues to give patient the Namenda twice daily.  She has not seen a great benefit from this. . Reviewed & discussed the following diabetes-related information with patient: o Continue checking blood sugars as directed (twice daily if able) o Follow ADA recommended "diabetes-friendly" diet  (reviewed healthy snack/food options); Patient continues to eat desserts.  Wife states she is trying to limit, however patient is headstrong at times. Discussed with her the likelihood  of having to add more antidiabetes medications. o Patients BG was checked today in clinic = 187 (3 hrs after smoothie this AM) - New one touch verio flex meter was set up again - Lancet drum was broken-new one ordered via mail order - Patient has test strips and lancets/ o Confirmed current DM regimen: Metformin once daily, Trulicity weekly (actos d/c'd due to edema/CKD) o Discussed GLP-1 injection technique.  Patient is self-administering per wife. o Reviewed medication purpose/side effects-->wife denies adverse events, denies hypoglycemia o Most recent A1c is 10.5% on 09/29/19 (continues to increase: 8.2% on 6/23, 7.5% on 3/23.  Wife working to give medications as prescribed, but receives resistance from patient as he is battling with dementia.  They are using compliance packaging by [facility] [employer] mail order.  This method appears to be the best method of organization/compliance. o Current anti-hypertensive regimen: carvedilol, amlodipine, valsartan o Current anti-hyperlipidemia regimen: pravastatin 20mg  daily o Continue taking all medications as prescribed by provider o Patient now has all medications in pill packs as ordered at last CCM PharmD visit   CCM RN CM Interventions:  10/08/19 completed call with spouse [name]  Inbound call received from spouse [name] . Evaluation of current treatment plan related to Diabetes and patient's adherence to plan as established by provider. . Advised patient to stop by the office to pick up his new glucometer; discussed the [facility] nurse will work with Mr. Lukacs and [name] on how to properly use the glucometer; discussed the embedded Pharm D Lottie Dawson is also available if needed and can scheduled a face to face OV if further instruction is needed . Discussed  plans with patient for ongoing care management follow up and provided patient with direct contact information for care management team . Advised patient, providing education and  rationale, to check cbg daily before meals and record, calling the CCM team and or PCP for findings outside established parameters.   . Discussed Mr. Ellithorpe is riding his exercise bike daily in the evenings and is feeling better overall . Discussed [name] is waking him earlier to ensure he is checking his CBG, eating breakfast and taking his am medications . Discussed meal preparation and dinner time is now earlier in the evening - educated [name] on the importance of closely monitoring Mr. On CBG's at home due to adding exercise to his daily routine can lower his CBG's - educated [name] on measures to take for hypoglycemia  . Mailed printed signs/symptoms hypo/hyperglycemia reference sheet  Patient Self Care Activities:  . Patient will take medications as prescribed with help from wife and pill packaging system . Patient will focus on improved adherence by allowing wife to assist with pill pack administration  Please see past updates related to this goal by clicking on the "Past Updates" button in the selected goal         The patient verbalized understanding of instructions provided today and declined a print copy of patient instruction materials.   The care management team will reach out to the patient again over the next 60 days.   SIGNATURE Regina Eck, PharmD, BCPS Clinical Pharmacist, [facility] [place]: [phone]

## 2019-11-15 ENCOUNTER — Ambulatory Visit: Payer: Self-pay | Admitting: Pharmacist

## 2019-11-15 DIAGNOSIS — E1122 Type 2 diabetes mellitus with diabetic chronic kidney disease: Secondary | ICD-10-CM

## 2019-11-15 DIAGNOSIS — N1831 Chronic kidney disease, stage 3a: Secondary | ICD-10-CM

## 2019-11-15 MED ORDER — ONETOUCH DELICA LANCING DEV MISC: 1 refills | Status: AC

## 2019-11-15 NOTE — Patient Instructions (Signed)
Visit Information  Goals Addressed            This Visit's Progress   . I would like to continue to optimize his medication management of my chronic conditions.       Spouse stated Current Barriers:  . Polypharmacy; complex patient with multiple comorbidities including dementia, diabetes, hypertension . Wife assists with managing medications via [employer] pill packaging system.  Patient continues to take pills out and sometimes does not take them.  Pharmacist Clinical Goal(s):  [name] Over the next 90 days, patient will work with PharmD and provider towards optimized medication management  CCM SW Interventions: Completed 11/08/19 with [name] . Outbound call placed to [name] to assist with care coordination . Discussed [name] concern surrounding patient ability to use new glucometer o "He keeps using the old machine and I think he uses the same needle more than once because he cant get enough blood out of his finger sometimes" o Mrs Arsenio Loader requests in person follow up with PharmD to review instructions . Collaboration with embedded PharmD regarding appointment availability o Scheduled office visit for Wednesday January 13 at 2:30 pm  Interventions: Incoming call form spouse 11/15/19 . Comprehensive medication review performed; medication list updated in electronic medical record . Counseled wife on working to oversee pill pack administration as patient takes pills out at times and forgets to take them.  She continues to give patient the Namenda twice daily.  She has not seen a great benefit from this. . Reviewed & discussed the following diabetes-related information with patient: o Continue checking blood sugars as directed (twice daily if able) o Follow ADA recommended "diabetes-friendly" diet  (reviewed healthy snack/food options); Patient continues to eat desserts.  Wife states she is trying to limit, however patient is headstrong at times. Discussed with her the likelihood  of having to add more antidiabetes medications. o Patients BG was checked today in clinic = 187 (3 hrs after smoothie this AM) - New one touch verio flex meter was set up again - Lancet drum was broken-new one ordered via mail order - Patient has test strips and lancets - 11/15/19-Pharmacy filled lancets not lancing device--called in lancing device again o Confirmed current DM regimen: Metformin once daily, Trulicity weekly (actos d/c'd due to edema/CKD) o Discussed GLP-1 injection technique.  Patient is self-administering per wife. o Reviewed medication purpose/side effects-->wife denies adverse events, denies hypoglycemia o Most recent A1c is 10.5% on 09/29/19 (continues to increase: 8.2% on 6/23, 7.5% on 3/23.  Wife working to give medications as prescribed, but receives resistance from patient as he is battling with dementia.  They are using compliance packaging by [facility] mail order.  This method appears to be the best method of organization/compliance. o Current anti-hypertensive regimen: carvedilol, amlodipine, valsartan o Current anti-hyperlipidemia regimen: pravastatin 20mg  daily o Continue taking all medications as prescribed by provider o Patient now has all medications in pill packs as ordered at last CCM PharmD visit   CCM RN CM Interventions:  10/08/19 completed call with spouse [name]  Inbound call received from spouse [name] . Evaluation of current treatment plan related to Diabetes and patient's adherence to plan as established by provider. . Advised patient to stop by the office to pick up his new glucometer; discussed the [facility] nurse will work with Mr. Grieder and [name] on how to properly use the glucometer; discussed the embedded Pharm D Lottie Dawson is also available if needed and can scheduled a  face to face OV if further instruction is needed . Discussed plans with patient for ongoing care management follow up and provided patient with direct contact  information for care management team . Advised patient, providing education and rationale, to check cbg daily before meals and record, calling the CCM team and or PCP for findings outside established parameters.   . Discussed Mr. Leverette is riding his exercise bike daily in the evenings and is feeling better overall . Discussed [name] is waking him earlier to ensure he is checking his CBG, eating breakfast and taking his am medications . Discussed meal preparation and dinner time is now earlier in the evening - educated [name] on the importance of closely monitoring Mr. Difatta CBG's at home due to adding exercise to his daily routine can lower his CBG's - educated [name] on measures to take for hypoglycemia  . Mailed printed signs/symptoms hypo/hyperglycemia reference sheet  Patient Self Care Activities:  . Patient will take medications as prescribed with help from wife and pill packaging system . Patient will focus on improved adherence by allowing wife to assist with pill pack administration  Please see past updates related to this goal by clicking on the "Past Updates" button in the selected goal         The patient verbalized understanding of instructions provided today and declined a print copy of patient instruction materials.   The care management team will reach out to the patient again over the next 30 days.   SIGNATURE Regina Eck, PharmD, BCPS Clinical Pharmacist, [facility] [place]: [phone]

## 2019-11-15 NOTE — Progress Notes (Signed)
Chronic Care Management    Visit Note  11/15/2019 Name: Dylan [name]. MRN: [id number] DOB: February 17, 1941  Referred by: [name], MD Reason for referral : Chronic Care Management   Dylan [name]. is a 79 y.o. year old male who is a primary care patient of [name], MD. 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    Incoming telephone call from [name] [name]'s spouse/caregiver.  Medications: Outpatient Encounter Medications as of 11/15/2019  Medication Sig  . allopurinol (ZYLOPRIM) 100 MG tablet TAKE 1 TABLET BY MOUTH EVERY DAY  . amLODipine (NORVASC) 5 MG tablet TAKE 1 TABLET BY MOUTH DAILY  . Ascorbic Acid (VITAMIN C) 1000 MG tablet Take 1,000 mg by mouth daily.  [name] aspirin 81 MG tablet Take 81 mg by mouth 3 (three) times a week.   . Blood Pressure KIT Check blood pressure daily 2 hours or more after taking medication  . carvedilol (COREG) 12.5 MG tablet Take 1 tablet (12.5 mg total) by mouth 2 (two) times daily.  . Cholecalciferol (VITAMIN D3) 5000 units CAPS Take 5,000 Units by mouth every evening.  . Coenzyme Q10 200 MG capsule Take 200 mg by mouth daily.  [name] COLCRYS 0.6 MG tablet Take 1 tablet (0.6 mg total) by mouth daily.  . diclofenac Sodium (VOLTAREN) 1 % GEL Apply 2 g topically 3 (three) times daily.  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 memantine (NAMENDA) 10 MG tablet Take 1 tablet (10 mg total) by mouth 2 (two) times daily.  . metFORMIN (GLUCOPHAGE) 1000 MG tablet Take one tablet by mouth daily.  Dylan [name] 28O  MISC Use as instructed to check blood sugars daily dx: e11.22  . pravastatin (PRAVACHOL) 20 MG tablet TAKE 1 TABLET BY ORAL ROUTE EVERY DAY  . traMADol (ULTRAM) 50 MG tablet Take 1 tablet (50 mg total) by mouth every 6 (six) hours as needed.  . TRULICITY 1.5 ZD/6.6YQ SOPN INJECT 1.5 MG INTO THE SKIN ONCE A WEEK.  . valsartan (DIOVAN) 320 MG tablet TAKE 1 TABLET BY MOUTH EVERY DAY  . [DISCONTINUED] Lancet Devices (ONE TOUCH DELICA LANCING DEV) MISC 1 Device by Does not apply route.   No facility-administered encounter medications on file as of 11/15/2019.     Objective:   Goals Addressed            This Visit's Progress   . I would like to continue to optimize his medication management of my chronic conditions.       Spouse stated Current Barriers:  . Polypharmacy; complex patient with multiple comorbidities including dementia, diabetes, hypertension . Wife assists with managing medications via [employer] pill packaging system.  Patient continues to take pills out and sometimes does not take them.  Pharmacist Clinical Goal(s):  [name] Over the next 90 days, patient will work with PharmD and provider towards optimized medication management  CCM SW Interventions: Completed 11/08/19 with Dylan [name] . Outbound call placed to Dylan [name] to assist with care coordination . Discussed Dylan [name] concern surrounding patient ability to use new glucometer o "He keeps using the old machine and I think  he uses the same needle more than once because he cant get enough blood out of his finger sometimes" o Dylan [name] requests in person follow up with PharmD to review instructions . Collaboration with embedded PharmD regarding appointment availability o Scheduled office visit for Wednesday January 13 at 2:30 pm  Interventions: Incoming call form spouse 11/15/19 . Comprehensive medication review performed; medication list updated in electronic medical record . Counseled wife on working to oversee pill  pack administration as patient takes pills out at times and forgets to take them.  She continues to give patient the Namenda twice daily.  She has not seen a great benefit from this. . Reviewed & discussed the following diabetes-related information with patient: o Continue checking blood sugars as directed (twice daily if able) o Follow ADA recommended "diabetes-friendly" diet  (reviewed healthy snack/food options); Patient continues to eat desserts.  Wife states she is trying to limit, however patient is headstrong at times. Discussed with her the likelihood of having to add more antidiabetes medications. o Patients BG was checked today in clinic = 187 (3 hrs after smoothie this AM) - New one touch verio flex meter was set up again - Lancet drum was broken-new one ordered via mail order - Patient has test strips and [name] - 11/15/19-Pharmacy filled [name] not lancing device--called in lancing device again o Confirmed current DM regimen: Metformin once daily, Trulicity weekly (actos d/c'd due to edema/CKD) o Discussed GLP-1 injection technique.  Patient is self-administering per wife. o Reviewed medication purpose/side effects-->wife denies adverse events, denies hypoglycemia o Most recent A1c is 10.5% on 09/29/19 (continues to increase: 8.2% on 6/23, 7.5% on 3/23.  Wife working to give medications as prescribed, but receives resistance from patient as he is battling with dementia.  They are using compliance packaging by [facility] mail order.  This method appears to be the best method of organization/compliance. o Current anti-hypertensive regimen: carvedilol, amlodipine, valsartan o Current anti-hyperlipidemia regimen: pravastatin 86m daily o Continue taking all medications as prescribed by provider o Patient now has all medications in pill packs as ordered at last CCM PharmD visit   CCM RN CM Interventions:  10/08/19 completed call with spouse Dylan [name] Inbound call received from spouse  Dylan [name]. Evaluation of current treatment plan related to Diabetes and patient's adherence to plan as established by provider. . Advised patient to stop by the office to pick up his new glucometer; [facility] will work with Mr. JMinehartand Ms. GArsenio Loaderon how to properly use the glucometer; discussed the embedded Pharm D JLottie Dawsonis also available if needed and can scheduled a face to face OV if further instruction is needed . Discussed plans with patient for ongoing care management follow up and provided patient with direct contact information for care management team . Advised patient, providing education and rationale, to check cbg daily before meals and record, calling the CCM team and or PCP for findings outside established parameters.   . Discussed Mr. JHeacockis riding his exercise bike daily in the evenings and is feeling better overall . Discussed Ms. GArsenio Loaderis waking him earlier to ensure he is checking his CBG, eating breakfast and taking his am medications . Discussed meal preparation and dinner time is now earlier in the evening - educated Ms. GArsenio Loaderon the importance of closely monitoring Mr. JBreedenCBG's at home due to adding exercise to his daily routine can lower his CBG's - educated Ms. GArsenio Loaderon measures  to take for hypoglycemia  . Mailed printed signs/symptoms hypo/hyperglycemia reference sheet  Patient Self Care Activities:  . Patient will take medications as prescribed with help from wife and pill packaging system . Patient will focus on improved adherence by allowing wife to assist with pill pack administration  Please see past updates related to this goal by clicking on the "Past Updates" button in the selected goal          Plan:   The care management team will reach out to the patient again over the next 30 days.   Provider Signature Regina Eck, PharmD, BCPS Clinical Pharmacist, [facility] [place]: [phone]

## 2019-11-18 ENCOUNTER — Other Ambulatory Visit: Payer: Self-pay

## 2019-11-18 ENCOUNTER — Ambulatory Visit: Payer: Medicare HMO | Admitting: Podiatry

## 2019-11-18 DIAGNOSIS — M79674 Pain in right toe(s): Secondary | ICD-10-CM | POA: Diagnosis not present

## 2019-11-18 DIAGNOSIS — B351 Tinea unguium: Secondary | ICD-10-CM

## 2019-11-18 DIAGNOSIS — E1151 Type 2 diabetes mellitus with diabetic peripheral angiopathy without gangrene: Secondary | ICD-10-CM

## 2019-11-18 DIAGNOSIS — M79675 Pain in left toe(s): Secondary | ICD-10-CM | POA: Diagnosis not present

## 2019-11-18 NOTE — Patient Instructions (Signed)

## 2019-11-23 ENCOUNTER — Other Ambulatory Visit: Payer: Self-pay | Admitting: Internal Medicine

## 2019-11-23 ENCOUNTER — Encounter: Payer: Self-pay | Admitting: Podiatry

## 2019-11-23 NOTE — Progress Notes (Signed)
Subjective: Dylan [name]. presents today for follow up of preventative diabetic foot care: and painful mycotic nails b/l that are difficult to trim. Pain interferes with ambulation. Aggravating factors include wearing enclosed shoe gear. Pain is relieved with periodic professional debridement.   No Known Allergies   Objective: There were no vitals filed for this visit.  Vascular Examination:  capillary fill time delayed to the digits, faintly palpable pedal pulses b/l, pedal hair absent b/l and skin temperature gradient within normal limits b/l  Dermatological Examination: Pedal skin with normal turgor, texture and tone bilaterally., No open wounds bilaterally. and No interdigital macerations bilaterally.  onychomycosis of the toenails 1-5 b/l with elongation, dystrophy, discoloration, thickening, subungual debris and pain to palpation of nail beds  Musculoskeletal: normal muscle strength 5/5 to all lower extremity muscle groups bilaterally, no gross bony deformities bilaterally and no pain crepitus or joint limitation noted with ROM  Neurological: sensation intact 5/5 intact bilaterally with 10g monofilament  Assessment: 1. Pain due to onychomycosis of toenails of both feet   2. Type II diabetes mellitus with peripheral circulatory disorder (HCC)      Plan: Continue diabetic foot care principles. Literature dispensed on today.  -Toenails 1-5 b/l were debrided in length and girth without iatrogenic bleeding. -Patient to continue soft, supportive shoe gear daily. -Patient to report any pedal injuries to medical professional immediately. -Patient/POA to call should there be question/concern in the interim.  Return in about 3 months (around 02/16/2020).

## 2019-11-25 ENCOUNTER — Telehealth: Payer: Medicare HMO | Admitting: Pharmacist

## 2019-11-28 LAB — HM DIABETES EYE EXAM

## 2019-11-29 ENCOUNTER — Telehealth: Payer: Self-pay

## 2019-11-29 NOTE — Telephone Encounter (Signed)
PPM Alert received for HVR 11/28/19 at 2309, 48 beats ~15 seconds.  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    Spoke with pt and his significant other [name], he is asymptomatic currently and at time of episode.  [name] reports pt ate foods yesterday that she told him not eat, including [place] food and cookies. Pt confirmed he is taking his medications including Carvedilol  12.5mg  BID.  Pt does not monitor his BP at home.    Advised pt will forward to Dr Curt Bears for review.  No upcoming appointments scheduled.

## 2019-12-02 ENCOUNTER — Ambulatory Visit: Payer: Self-pay | Admitting: Pharmacist

## 2019-12-02 ENCOUNTER — Telehealth: Payer: Medicare HMO

## 2019-12-02 DIAGNOSIS — I10 Essential (primary) hypertension: Secondary | ICD-10-CM

## 2019-12-02 DIAGNOSIS — R413 Other amnesia: Secondary | ICD-10-CM

## 2019-12-02 MED ORDER — CARVEDILOL 25 MG PO TABS: 25.0000 mg | ORAL_TABLET | Freq: Two times a day (BID) | ORAL | 6 refills | Status: DC

## 2019-12-02 NOTE — Telephone Encounter (Signed)
lmtcb

## 2019-12-02 NOTE — Telephone Encounter (Signed)
Reviewed advisement with wife (dpr on file). Agreeable to plan. Advised to call if SE/issues after medication increase. Wife verbalized understanding and agreeable to plan.

## 2019-12-02 NOTE — Progress Notes (Signed)
Chronic Care Management   Visit Note  12/02/2019 Name: Dylan [name]. MRN: [id number] DOB: September 22, 1941  Referred by: [name], MD Reason for referral : Chronic Care Management   Dylan [name]. is a 79 y.o. year old male who is a primary care patient of [name], MD. 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     Medications: Outpatient Encounter Medications as of 12/02/2019  Medication Sig Note  . allopurinol (ZYLOPRIM) 100 MG tablet TAKE 1 TABLET BY MOUTH EVERY DAY   . amLODipine (NORVASC) 5 MG tablet TAKE 1 TABLET BY MOUTH DAILY   . Ascorbic Acid (VITAMIN C) 1000 MG tablet Take 1,000 mg by mouth daily.   [name] aspirin 81 MG tablet Take 81 mg by mouth 3 (three) times a week.    . Blood Pressure KIT Check blood pressure daily 2 hours or more after taking medication   . carvedilol (COREG) 25 MG tablet Take 1 tablet (25 mg total) by mouth 2 (two) times daily.   . Cholecalciferol (VITAMIN D3) 5000 units CAPS Take 5,000 Units by mouth every evening.   . Coenzyme Q10 200 MG capsule Take 200 mg by mouth daily.   [name] COLCRYS 0.6 MG tablet Take 1 tablet (0.6 mg total) by mouth daily.   . diclofenac Sodium (VOLTAREN) 1 % GEL Apply 2 g topically 3 (three) times daily.   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 Lancet Devices (ONE TOUCH DELICA LANCING DEV) MISC Please fill ONE TOUCH DELICA LANCING DEVICE.  Use to test blood sugar twice daily as directed. E11.65   . memantine (NAMENDA) 10 MG tablet Take 1 tablet (10 mg total) by mouth 2 (two)  times daily. 12/05/2019: Patient is only taking 1 tablet in the am but is holding the pm dose due to having headaches  . metFORMIN (GLUCOPHAGE) 1000 MG tablet Take one tablet by mouth daily. (Patient not taking: Reported on 12/02/2019)   . pravastatin (PRAVACHOL) 20 MG tablet TAKE 1 TABLET BY ORAL ROUTE EVERY DAY   . traMADol (ULTRAM) 50 MG tablet Take 1 tablet (50 mg total) by mouth every 6 (six) hours as needed.   . TRULICITY 1.5 IN/8.6VE SOPN INJECT 1.5 MG INTO THE SKIN ONCE A WEEK.   . valsartan (DIOVAN) 320 MG tablet TAKE 1 TABLET BY MOUTH EVERY DAY   . [DISCONTINUED] OneTouch Delica Lancets 72C MISC Use as instructed to check blood sugars daily dx: e11.22    No facility-administered encounter medications on file as of 12/02/2019.     Objective:   Goals Addressed            This Visit's Progress   . I would like to continue to optimize his medication management of my chronic conditions.       Spouse stated Current Barriers:  . Polypharmacy; complex patient with multiple comorbidities including dementia, diabetes, hypertension . Wife assists with managing medications via [employer] pill packaging system.  Patient continues to take pills out and sometimes does not take them.  Pharmacist Clinical Goal(s):  [name] Over the next 90 days, patient will work with  PharmD and provider towards optimized medication management  Interventions: Incoming call form spouse 2/321 . Comprehensive medication review performed; medication list updated in electronic medical record . Counseled wife on working to oversee pill pack administration as patient takes pills out at times and forgets to take them.  She continues to give patient the Namenda twice daily.  She has not seen a great benefit from this. . Reviewed & discussed the following diabetes-related information with patient: o Continue checking blood sugars as directed (twice daily if able) o Follow ADA recommended "diabetes-friendly" diet  (reviewed healthy  snack/food options); Patient continues to eat desserts.  Wife states she is trying to limit, however patient is headstrong at times. Discussed with her the likelihood of having to add more antidiabetes medications. o Patients BG was checked today in clinic = 187 (3 hrs after smoothie this AM) - New one touch verio flex meter is not working well due to patient being unable to use lancing device - Will call in basic lancets--new lancing device is not working for patient/wife o Confirmed current DM regimen: Metformin once daily, Trulicity weekly (actos d/c'd due to edema/CKD) o Discussed GLP-1 injection technique.  Patient is self-administering per wife. o Reviewed medication purpose/side effects-->wife denies adverse events, denies hypoglycemia o Most recent A1c is 10.5% on 09/29/19 (continues to increase: 8.2% on 6/23, 7.5% on 3/23.  Wife working to give medications as prescribed, but receives resistance from patient as he is battling with dementia.  They are using compliance packaging by [facility] mail order.  This method appears to be the best method of organization/compliance. o Current anti-hypertensive regimen: carvedilol, amlodipine, valsartan o Current anti-hyperlipidemia regimen: pravastatin 35m daily o Continue taking all medications as prescribed by provider o 12/04/19: Patient is only taking memantine once daily vs twice daily due to headaches.  He states when he takes it twice daily, he has a headache.  When he omits night time memantine dose, he does not have headache - Can explore namenda XR version   CCM RN CM Interventions:  10/08/19 completed call with spouse Dylan [name] Inbound call received from spouse Dylan [name]. Evaluation of current treatment plan related to Diabetes and patient's adherence to plan as established by provider. . Advised patient to stop by the office to pick up his new glucometer; [facility] with [name] how to  properly use the glucometer; discussed the embedded Pharm D JLottie Dawsonis also available if needed and can scheduled a face to face OV if further instruction is needed . Discussed plans with patient for ongoing care management follow up and provided patient with direct contact information for care management team . Advised patient, providing education and rationale, to check cbg daily before meals and record, calling the CCM team and or PCP for findings outside established parameters.   . Discussed Dylan [name] riding his exercise bike daily in the evenings and is feeling better overall . Discussed [name] Loaderis waking him earlier to ensure he is checking his CBG, eating breakfast and taking his am medications . Discussed meal preparation and dinner time is now earlier in the evening - educated Dylan [name] the importance of closely monitoring Mr. JCastrillonCBG's at home due to adding exercise to his daily routine can lower his CBG's - educated Dylan [name] measures to take for hypoglycemia  . Mailed printed signs/symptoms hypo/hyperglycemia reference sheet  Patient Self Care Activities:  .  Patient will take medications as prescribed with help from wife and pill packaging system . Patient will focus on improved adherence by allowing wife to assist with pill pack administration  Please see past updates related to this goal by clicking on the "Past Updates" button in the selected goal           Plan:   The care management team will reach out to the patient again over the next 30 days.   Provider Signature Regina Eck, PharmD, BCPS Clinical Pharmacist, [facility] [place]: [phone]

## 2019-12-03 ENCOUNTER — Encounter: Payer: Self-pay | Admitting: Internal Medicine

## 2019-12-05 ENCOUNTER — Other Ambulatory Visit: Payer: Self-pay

## 2019-12-05 ENCOUNTER — Telehealth: Payer: Medicare HMO

## 2019-12-05 ENCOUNTER — Ambulatory Visit (INDEPENDENT_AMBULATORY_CARE_PROVIDER_SITE_OTHER): Payer: Medicare HMO

## 2019-12-05 DIAGNOSIS — E1121 Type 2 diabetes mellitus with diabetic nephropathy: Secondary | ICD-10-CM

## 2019-12-05 DIAGNOSIS — I10 Essential (primary) hypertension: Secondary | ICD-10-CM

## 2019-12-05 DIAGNOSIS — R413 Other amnesia: Secondary | ICD-10-CM

## 2019-12-05 DIAGNOSIS — N1831 Chronic kidney disease, stage 3a: Secondary | ICD-10-CM | POA: Diagnosis not present

## 2019-12-05 DIAGNOSIS — N183 Chronic kidney disease, stage 3 unspecified: Secondary | ICD-10-CM

## 2019-12-05 DIAGNOSIS — E1122 Type 2 diabetes mellitus with diabetic chronic kidney disease: Secondary | ICD-10-CM

## 2019-12-06 MED ORDER — LANCETS MISC: 4 refills | Status: AC

## 2019-12-06 NOTE — Patient Instructions (Signed)
Visit Information  Goals Addressed            This Visit's Progress   . I would like to continue to optimize his medication management of my chronic conditions.       Spouse stated Current Barriers:  . Polypharmacy; complex patient with multiple comorbidities including dementia, diabetes, hypertension . Wife assists with managing medications via [facility] pill packaging system.  Patient continues to take pills out and sometimes does not take them.  Pharmacist Clinical Goal(s):  [name] Over the next 90 days, patient will work with PharmD and provider towards optimized medication management  Interventions: Incoming call form spouse 2/321 . Comprehensive medication review performed; medication list updated in electronic medical record . Counseled wife on working to oversee pill pack administration as patient takes pills out at times and forgets to take them.  She continues to give patient the Namenda twice daily.  She has not seen a great benefit from this. . Reviewed & discussed the following diabetes-related information with patient: o Continue checking blood sugars as directed (twice daily if able) o Follow ADA recommended "diabetes-friendly" diet  (reviewed healthy snack/food options); Patient continues to eat desserts.  Wife states she is trying to limit, however patient is headstrong at times. Discussed with her the likelihood of having to add more antidiabetes medications. o Patients BG was checked today in clinic = 187 (3 hrs after smoothie this AM) - New one touch verio flex meter is not working well due to patient being unable to use lancing device - Will call in basic lancets--new lancing device is not working for patient/wife o Confirmed current DM regimen: Metformin once daily, Trulicity weekly (actos d/c'd due to edema/CKD) o Discussed GLP-1 injection technique.  Patient is self-administering per wife. o Reviewed medication purpose/side effects-->wife denies adverse events, denies  hypoglycemia o Most recent A1c is 10.5% on 09/29/19 (continues to increase: 8.2% on 6/23, 7.5% on 3/23.  Wife working to give medications as prescribed, but receives resistance from patient as he is battling with dementia.  They are using compliance packaging by [facility] mail order.  This method appears to be the best method of organization/compliance. o Current anti-hypertensive regimen: carvedilol, amlodipine, valsartan o Current anti-hyperlipidemia regimen: pravastatin 20mg  daily o Continue taking all medications as prescribed by provider o 12/04/19: Patient is only taking memantine once daily vs twice daily due to headaches.  He states when he takes it twice daily, he has a headache.  When he omits night time memantine dose, he does not have headache - Can explore namenda XR version   CCM RN CM Interventions:  10/08/19 completed call with spouse [name]  Inbound call received from spouse [name] . Evaluation of current treatment plan related to Diabetes and patient's adherence to plan as established by provider. . Advised patient to stop by the office to pick up his new glucometer; discussed the [facility] nurse will work with Mr. Sheahan and [name] on how to properly use the glucometer; discussed the embedded Pharm D Lottie Dawson is also available if needed and can scheduled a face to face OV if further instruction is needed . Discussed plans with patient for ongoing care management follow up and provided patient with direct contact information for care management team . Advised patient, providing education and rationale, to check cbg daily before meals and record, calling the CCM team and or PCP for findings outside established parameters.   . Discussed Mr. Hanzlik is riding his exercise bike  daily in the evenings and is feeling better overall . Discussed [name] is waking him earlier to ensure he is checking his CBG, eating breakfast and taking his am medications . Discussed meal  preparation and dinner time is now earlier in the evening - educated [name] on the importance of closely monitoring Mr. Manninen CBG's at home due to adding exercise to his daily routine can lower his CBG's - educated [name] on measures to take for hypoglycemia  . Mailed printed signs/symptoms hypo/hyperglycemia reference sheet  Patient Self Care Activities:  . Patient will take medications as prescribed with help from wife and pill packaging system . Patient will focus on improved adherence by allowing wife to assist with pill pack administration  Please see past updates related to this goal by clicking on the "Past Updates" button in the selected goal         The patient verbalized understanding of instructions provided today and declined a print copy of patient instruction materials.   The care management team will reach out to the patient again over the next 30 days.   SIGNATURE Regina Eck, PharmD, BCPS Clinical Pharmacist, [facility] [place]: [phone]

## 2019-12-09 NOTE — Chronic Care Management (AMB) (Signed)
Chronic Care Management   Follow Up Note   12/05/2019 Name: Dylan [name]. MRN: [id number] DOB: January 22, 1941  Referred by: [name], MD Reason for referral : Chronic Care Management (CCM RNCM Telephone Follow up )   Dylan Ina. is a 79 y.o. year old male who is a primary care patient of [name], MD. The CCM team was consulted for assistance with chronic disease management and care coordination needs.    Review of patient status, including review of consultants reports, relevant laboratory and other test results, and collaboration with appropriate care team members and the patient's provider was performed as part of comprehensive patient evaluation and provision of chronic care management services.    SDOH (Social Determinants of Health) screening performed today: None. See Care Plan for related entries.   Placed outbound call to spouse Dylan [name] for a CCM RN CM patient update.   Outpatient Encounter Medications as of 12/05/2019  Medication Sig Note  . allopurinol (ZYLOPRIM) 100 MG tablet TAKE 1 TABLET BY MOUTH EVERY DAY   . amLODipine (NORVASC) 5 MG tablet TAKE 1 TABLET BY MOUTH DAILY   . Ascorbic Acid (VITAMIN C) 1000 MG tablet Take 1,000 mg by mouth daily.   [name] aspirin 81 MG tablet Take 81 mg by mouth 3 (three) times a week.    . Blood Pressure KIT Check blood pressure daily 2 hours or more after taking medication   . carvedilol (COREG) 25 MG tablet Take 1 tablet (25 mg total) by mouth 2 (two) times daily.   . Cholecalciferol (VITAMIN D3) 5000 units CAPS Take 5,000 Units by mouth every evening.   . Coenzyme Q10 200 MG capsule Take 200 mg by mouth daily.   [name] COLCRYS 0.6 MG tablet Take 1 tablet (0.6 mg total) by mouth daily.   . diclofenac Sodium (VOLTAREN) 1 % GEL Apply 2 g topically 3 (three) times daily.   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 Lancet Devices (ONE TOUCH DELICA LANCING DEV) MISC  Please fill ONE TOUCH DELICA LANCING DEVICE.  Use to test blood sugar twice daily as directed. E11.65   . memantine (NAMENDA) 10 MG tablet Take 1 tablet (10 mg total) by mouth 2 (two) times daily. 12/05/2019: Patient is only taking 1 tablet in the am but is holding the pm dose due to having headaches  . metFORMIN (GLUCOPHAGE) 1000 MG tablet Take one tablet by mouth daily. (Patient not taking: Reported on 12/02/2019)   . pravastatin (PRAVACHOL) 20 MG tablet TAKE 1 TABLET BY ORAL ROUTE EVERY DAY   . traMADol (ULTRAM) 50 MG tablet Take 1 tablet (50 mg total) by mouth every 6 (six) hours as needed.   . TRULICITY 1.5 VE/9.3YB SOPN INJECT 1.5 MG INTO THE SKIN ONCE A WEEK.   . valsartan (DIOVAN) 320 MG tablet TAKE 1 TABLET BY MOUTH EVERY DAY   . [DISCONTINUED] OneTouch Delica Lancets 01B MISC Use as instructed to check blood sugars daily dx: e11.22    No facility-administered encounter medications on file as of 12/05/2019.     Objective:  Lab Results  Component Value Date   HGBA1C 10.5 (H) 09/19/2019   HGBA1C 8.3 (H) 07/24/2019   HGBA1C 8.2 (H) 04/23/2019   Lab Results  Component Value Date   MICROALBUR 80 01/22/2019   LDLCALC 66 07/24/2019   CREATININE 1.47 (H) 10/17/2019   BP Readings from Last 3 Encounters:  11/05/19 136/68  10/20/19 (!) 161/67  10/17/19 126/76    Goals Addressed    . 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 Over the next 90 days, patient/spouse will report any/all falls to the CCM team and PCP provider  . Over the next 90 days, patient will not experience having any falls and or injury related to falls   Interventions:  . Evaluation of current treatment plan related to Impaired Physical Mobility and patient's adherence to plan as established by provider . Determined patient experienced a fall in late December while in his home and not using his cane resulting in an ED visit; Determined patient suffered from a closed left hand fracture and rib contusion . Assessed for change in mental status and or sensory changes; Assessed for worsening impaired gait/balance . Determined patient was not wearing shoes and did not have his cane in use when he fell in his bedroom while trying to hold onto his bed for leverage . Discussed patient will be discharged from in home PT next week due to meeting all goals . Discussed spouse Dylan [name] continues to encourage him to use his DME at all times and to perform his HEP as directed and this includes using his exercise bike as tolerated . Provided disease education regarding patient's vascular dementia and brain atrophy noted by the Neurologist to be affecting his balance and coordination; further education provided regarding patient's impaired physical mobility and high risk for falls and injury from falls  . Instructed spouse to notify [name] and or the CCM team promptly of any further falls and or injury from falls . Discussed plans with patient for ongoing care management follow up and provided patient with direct contact information for care management team . Sent in basket message to [name] with a patient update concerning patient's fall and injury occurring in late December   Patient Self Care Activities: . Unable to self administer medications as prescribed . Does not adhere to provider recommendations re: ADA diet . Unable to perform ADLs  independently . Unable to perform IADLs independently  Please see past updates related to this goal by clicking on the "Past Updates" button in the selected goal      . "He's eating too many sweets"       Spouse stated Current Barriers:  [name] Knowledge Deficits related to diabetes Meal planning using the plate method and portion control . 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 12/05/19 New Over the next 90 days, patient and spouse will report improved adherence with Self monitoring his CBG's at home and will have increased knowledge of how to manage any abnormal readings . 12/05/19 New  Over the next 90 days, patient/spouse will adhere to limiting sugary drinks and deserts AEB improved FBS within range of 80-130  CCM RN CM Interventions:  . Evaluation of current treatment plan related to diabetes and patient's adherence to plan as established by provider . Reinforced education to patient/spouse Dylan [name] re: importance of adherence to following a low carb diet using Meal Planning with the plate method with portion control; reviewed patient's recent A1C increased to 10.5 obtained on 09/19/19; education provided related to potential health risks for uncontrolled diabetes; reinforced importance to work on lowering A1C with goal <7.0  . Reviewed medications with patient and discussed patient is adhering to taking his weekly injection of Trulicity and patient is self injecting; with spouse Dylan [name] supervising dosage and administration . Determined patient and spouse are having difficulty changing out the lancet from patient's glucose pin due to having poor dexterity; Determined for this reason, patient's FBS are not being  monitored exactly as directed . Discussed embedded Pharm D Dylan [name] is aware and has recommended a One Touch Glucometer and will assist with obtaining . Sent in basket message to [facility] requesting the status on this equipment; advised the patient's pharmacy did not receive an Rx for this equipment . Discussed plans with patient for ongoing care management follow up and provided patient with direct contact information for care management team . Reiterated to spouse Dylan [name], providing education and rationale, to check cbg daily before meals and record, calling the CCM team and or PCP for findings outside established parameters.  <80 and or >250 . Determined Dylan [name] continues to have Mr. Sakai on a better schedule and he is eating his meals on time  . Discussed his average FBS when checked has been between the target range of 80-130   Patient Self Care Activities:  . Unable to independently perform self care  Please see past updates related to this goal by clicking on the "Past Updates" button in the selected goal          Plan:   Telephone follow up appointment with care management team member scheduled for: 01/20/20  [name], RN, BSN, CCM Care Management Coordinator [facility]  Direct Phone: [phone]

## 2019-12-09 NOTE — Patient Instructions (Signed)
Visit Information  Goals Addressed    . 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 Chronic Disease Management support and education needs related to DMII, CKDIII, HTN, memory loss   Nurse Case Manager Clinical Goal(s):  [name] Over the next 60 days, patient will verbalize understanding of plan for following home safety recommendations, including wearing good supportive shoes even while inside his home, using his cane/walker at all times, keeping floors clutter free and hallways well lit to help avoid falls . Over the next 90 days, patient/spouse will report any/all falls to the CCM team and PCP provider  . Over the next 90 days, patient will not experience having any falls and or injury related to falls   Interventions:  . Evaluation of current treatment plan related to Impaired Physical Mobility and patient's adherence to plan as established by provider . Determined patient experienced a fall in late December while in his home and not using his cane resulting in an ED visit; Determined patient suffered from a closed left hand fracture and rib contusion . Assessed for change in mental status and or sensory changes; Assessed for worsening impaired gait/balance . Determined patient was not wearing shoes and did not have his cane in use when he fell in his bedroom while trying to hold onto his bed for leverage . Discussed patient will be discharged from in home PT next week due to meeting all goals . Discussed spouse [name] continues to encourage him to use his DME at all times and to perform his HEP as directed and this includes using his exercise bike as tolerated . Provided disease education regarding patient's vascular dementia and brain atrophy noted by the Neurologist to be affecting his balance and coordination; further education provided  regarding patient's impaired physical mobility and high risk for falls and injury from falls  . Instructed spouse to notify [name] and or the CCM team promptly of any further falls and or injury from falls . Discussed plans with patient for ongoing care management follow up and provided patient with direct contact information for care management team . Sent in basket message to [name] with a patient update concerning patient's fall and injury occurring in late December   Patient Self Care Activities: . Unable to self administer medications as prescribed . Does not adhere to provider recommendations re: ADA diet . Unable to perform ADLs independently . Unable to perform IADLs independently  Please see past updates related to this goal by clicking on the "Past Updates" button in the selected goal      . "He's eating too many sweets"       Spouse stated Current Barriers:  [name] Knowledge Deficits related to diabetes Meal planning using the plate method and portion control . 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 12/05/19 New Over the next 90 days, patient and spouse will report improved adherence with Self monitoring his CBG's at home and will have increased knowledge of how to manage any abnormal readings . 12/05/19 New Over the next 90 days, patient/spouse will  adhere to limiting sugary drinks and deserts AEB improved FBS within range of 80-130  CCM RN CM Interventions:  . Evaluation of current treatment plan related to diabetes and patient's adherence to plan as established by provider . Reinforced education to patient/spouse [name] re: importance of adherence to following a low carb diet using Meal  Planning with the plate method with portion control; reviewed patient's recent A1C increased to 10.5 obtained on 09/19/19; education provided related to potential health risks for uncontrolled diabetes; reinforced importance to work on lowering A1C with goal <7.0  . Reviewed medications with patient and discussed patient is adhering to taking his weekly injection of Trulicity and patient is self injecting; with spouse [name] supervising dosage and administration . Determined patient and spouse are having difficulty changing out the lancet from patient's glucose pin due to having poor dexterity; Determined for this reason, patient's FBS are not being monitored exactly as directed . Discussed embedded Pharm D Lottie Dawson is aware and has recommended a One Touch Glucometer and will assist with obtaining . Sent in basket message to [facility] requesting the status on this equipment; advised the patient's pharmacy did not receive an Rx for this equipment . Discussed plans with patient for ongoing care management follow up and provided patient with direct contact information for care management team . Reiterated to spouse [name], providing education and rationale, to check cbg daily before meals and record, calling the CCM team and or PCP for findings outside established parameters.  <80 and or >250 . Determined [name] continues to have Mr. Knoch on a better schedule and he is eating his meals on time  . Discussed his average FBS when checked has been between the target range of 80-130   Patient Self Care Activities:  . Unable to independently perform self care  Please see past updates related to this goal by clicking on the "Past Updates" button in the selected goal         The patient verbalized understanding of instructions provided today and declined a print copy of patient instruction materials.   Telephone follow up appointment with care management team member scheduled for:  01/20/20  [name], RN, BSN, CCM Care Management Coordinator [facility]  Direct Phone: [phone]

## 2019-12-10 ENCOUNTER — Ambulatory Visit: Payer: Self-pay | Admitting: Pharmacist

## 2019-12-10 DIAGNOSIS — E1122 Type 2 diabetes mellitus with diabetic chronic kidney disease: Secondary | ICD-10-CM

## 2019-12-10 DIAGNOSIS — N1831 Chronic kidney disease, stage 3a: Secondary | ICD-10-CM

## 2019-12-11 NOTE — Patient Instructions (Signed)
Visit Information  Goals Addressed            This Visit's Progress   . I would like to continue to optimize his medication management of my chronic conditions.       Spouse stated Current Barriers:  . Polypharmacy; complex patient with multiple comorbidities including dementia, diabetes, hypertension . Wife assists with managing medications via [facility] pill packaging system.  Patient continues to take pills out and sometimes does not take them.  Pharmacist Clinical Goal(s):  [name] Over the next 90 days, patient will work with PharmD and provider towards optimized medication management  Interventions: Incoming call from spouse 12/10/19 . Comprehensive medication review performed; medication list updated in electronic medical record . Counseled wife on working to oversee pill pack administration as patient takes pills out at times and forgets to take them.  She continues to give patient the Namenda twice daily.  She has not seen a great benefit from this. . Reviewed & discussed the following diabetes-related information with patient: o Continue checking blood sugars as directed (twice daily if able) o Follow ADA recommended "diabetes-friendly" diet  (reviewed healthy snack/food options); Patient continues to eat desserts.  Wife states she is trying to limit, however patient is headstrong at times. Discussed with her the likelihood of having to add more antidiabetes medications. - New one touch verio flex meter is not working well due to patient being unable to use lancing device - Will call in basic lancets--new lancing device is not working for patient/wife . [facility]ets [facility] ([facility] [place] [facility] not have in stock and couldn't order) . $8 for 43-month supply cash . Demonstrated how to use o Confirmed current DM regimen: Metformin once daily, Trulicity weekly (actos d/c'd due to edema/CKD) o Discussed GLP-1 injection technique.  Patient is self-administering per wife. o Reviewed  medication purpose/side effects-->wife denies adverse events, denies hypoglycemia o Most recent A1c is 10.5% on 09/29/19 (continues to increase: 8.2% on 6/23, 7.5% on 3/23.  Wife working to give medications as prescribed, but receives resistance from patient as he is battling with dementia.  They are using compliance packaging by [facility] mail order.  This method appears to be the best method of organization/compliance. o Current anti-hypertensive regimen: carvedilol, amlodipine, valsartan o Current anti-hyperlipidemia regimen: pravastatin 20mg  daily o Continue taking all medications as prescribed by provider o 12/04/19: Patient is only taking memantine once daily vs twice daily due to headaches.  He states when he takes it twice daily, he has a headache.  When he omits night time memantine dose, he does not have headache - Can explore namenda XR version at next appt  CCM RN CM Interventions:  10/08/19 completed call with spouse [name]  Inbound call received from spouse [name] . Evaluation of current treatment plan related to Diabetes and patient's adherence to plan as established by provider. . Advised patient to stop by the office to pick up his new glucometer; discussed the [facility] nurse will work with Mr. Birky and [name] on how to properly use the glucometer; discussed the embedded Pharm D Lottie Dawson is also available if needed and can scheduled a face to face OV if further instruction is needed . Discussed plans with patient for ongoing care management follow up and provided patient with direct contact information for care management team . Advised patient, providing education and rationale, to check cbg daily before meals and record, calling the CCM team and or PCP for findings  outside established parameters.   . Discussed Mr. Pecha is riding his exercise bike daily in the evenings and is feeling better overall . Discussed [name] is waking him earlier to ensure he is  checking his CBG, eating breakfast and taking his am medications . Discussed meal preparation and dinner time is now earlier in the evening - educated [name] on the importance of closely monitoring Mr. Garfield CBG's at home due to adding exercise to his daily routine can lower his CBG's - educated [name] on measures to take for hypoglycemia  . Mailed printed signs/symptoms hypo/hyperglycemia reference sheet  Patient Self Care Activities:  . Patient will take medications as prescribed with help from wife and pill packaging system . Patient will focus on improved adherence by allowing wife to assist with pill pack administration  Please see past updates related to this goal by clicking on the "Past Updates" button in the selected goal         The patient verbalized understanding of instructions provided today and declined a print copy of patient instruction materials.   The care management team will reach out to the patient again over the next 30 days.   SIGNATURE Regina Eck, PharmD, BCPS Clinical Pharmacist, [facility] [place]: [phone]

## 2019-12-11 NOTE — Progress Notes (Signed)
Chronic Care Management   Visit Note  12/11/2019 Name: Dylan [name]. MRN: [id number] DOB: [birth year]  Referred by: [name], MD Reason for referral : CCM-DIABETES   Dylan [name]. is a 79 y.o. year old male who is a primary care patient of [name], MD. 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     Medications: Outpatient Encounter Medications as of 12/10/2019  Medication Sig Note  . allopurinol (ZYLOPRIM) 100 MG tablet TAKE 1 TABLET BY MOUTH EVERY DAY   . amLODipine (NORVASC) 5 MG tablet TAKE 1 TABLET BY MOUTH DAILY   . Ascorbic Acid (VITAMIN C) 1000 MG tablet Take 1,000 mg by mouth daily.   [name] aspirin 81 MG tablet Take 81 mg by mouth 3 (three) times a week.    . Blood Pressure KIT Check blood pressure daily 2 hours or more after taking medication   . carvedilol (COREG) 25 MG tablet Take 1 tablet (25 mg total) by mouth 2 (two) times daily.   . Cholecalciferol (VITAMIN D3) 5000 units CAPS Take 5,000 Units by mouth every evening.   . Coenzyme Q10 200 MG capsule Take 200 mg by mouth daily.   [name] COLCRYS 0.6 MG tablet Take 1 tablet (0.6 mg total) by mouth daily.   . diclofenac Sodium (VOLTAREN) 1 % GEL Apply 2 g topically 3 (three) times daily.   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 Lancet Devices (ONE TOUCH DELICA LANCING DEV) MISC Please fill ONE TOUCH DELICA LANCING DEVICE.  Use to test blood sugar twice daily as directed. E11.65   . Lancets MISC Please fill basic/generic push button lancets.  Patient to test blood sugar twice daily.  DX: E11.65   . memantine (NAMENDA) 10 MG tablet Take 1 tablet (10 mg total) by mouth 2 (two) times daily. 12/05/2019: Patient is only taking 1 tablet in the am but is holding the pm dose due to having headaches  . metFORMIN (GLUCOPHAGE) 1000 MG tablet Take one tablet by mouth daily. (Patient not taking: Reported on 12/02/2019)   . pravastatin (PRAVACHOL) 20 MG tablet TAKE 1 TABLET BY ORAL ROUTE EVERY DAY   . traMADol (ULTRAM) 50 MG tablet Take 1 tablet (50 mg total) by mouth every 6 (six) hours as needed.   . TRULICITY 1.5 CX/5.0HK SOPN INJECT 1.5 MG INTO THE SKIN ONCE A WEEK.   . valsartan (DIOVAN) 320 MG tablet TAKE 1 TABLET BY MOUTH EVERY DAY    No facility-administered encounter medications on file as of 12/10/2019.     Objective:   Goals Addressed            This Visit's Progress   . I would like to continue to optimize his medication management of my chronic conditions.       Spouse stated Current Barriers:  . Polypharmacy; complex patient with multiple comorbidities including dementia, diabetes, hypertension . Wife assists with managing medications via [facility] pill packaging system.  Patient continues to take pills out and sometimes does not take them.  Pharmacist Clinical Goal(s):  [name] Over the next 90 days, patient will work with PharmD and  provider towards optimized medication management  Interventions: Incoming call from spouse 12/10/19 . Comprehensive medication review performed; medication list updated in electronic medical record . Counseled wife on working to oversee pill pack administration as patient takes pills out at times and forgets to take them.  She continues to give patient the Namenda twice daily.  She has not seen a great benefit from this. . Reviewed & discussed the following diabetes-related information with patient: o Continue checking blood sugars as directed (twice daily if able) o Follow ADA recommended "diabetes-friendly" diet  (reviewed healthy snack/food  options); Patient continues to eat desserts.  Wife states she is trying to limit, however patient is headstrong at times. Discussed with her the likelihood of having to add more antidiabetes medications. - New one touch verio flex meter is not working well due to patient being unable to use lancing device - Will call in basic lancets--new lancing device is not working for patient/wife . [facility]ets called [facility] ([facility] in [place] did not have in stock and couldn't order) . $8 for 41-monthsupply cash . Demonstrated how to use o Confirmed current DM regimen: Metformin once daily, Trulicity weekly (actos d/c'd due to edema/CKD) o Discussed GLP-1 injection technique.  Patient is self-administering per wife. o Reviewed medication purpose/side effects-->wife denies adverse events, denies hypoglycemia o Most recent A1c is 10.5% on 09/29/19 (continues to increase: 8.2% on 6/23, 7.5% on 3/23.  Wife working to give medications as prescribed, but receives resistance from patient as he is battling with dementia.  They are using compliance packaging by [facility] mail order.  This method appears to be the best method of organization/compliance. o Current anti-hypertensive regimen: carvedilol, amlodipine, valsartan o Current anti-hyperlipidemia regimen: pravastatin 241mdaily o Continue taking all medications as prescribed by provider o 12/04/19: Patient is only taking memantine once daily vs twice daily due to headaches.  He states when he takes it twice daily, he has a headache.  When he omits night time memantine dose, he does not have headache - Can explore namenda XR version at next appt  CCM RN CM Interventions:  10/08/19 completed call with spouse GlPeter CongoInbound call received from spouse GlStaci [name] Evaluation of current treatment plan related to Diabetes and patient's adherence to plan as established by provider. . Advised patient to stop by the office to pick up his new glucometer;  [facility] Mr. JoTadrosnd Ms. GiArsenio Loadern how to properly use the glucometer; discussed the embedded Pharm D JuLottie Dawsons also available if needed and can scheduled a face to face OV if further instruction is needed . Discussed plans with patient for ongoing care management follow up and provided patient with direct contact information for care management team . Advised patient, providing education and rationale, to check cbg daily before meals and record, calling the CCM team and or PCP for findings outside established parameters.   . Discussed Mr. JoCrehans riding his exercise bike daily in the evenings and is feeling better overall . Discussed Ms. GiArsenio Loaders waking him earlier to ensure he is checking his CBG, eating breakfast and taking his am medications . Discussed meal preparation and dinner time is now earlier in the evening - educated Ms. GiArsenio Loadern the importance of closely monitoring Mr. JoViveroBG's at home due to adding exercise to his daily routine can lower his CBG's - educated Ms. GiArsenio Loadern measures to take for hypoglycemia  . Mailed  printed signs/symptoms hypo/hyperglycemia reference sheet  Patient Self Care Activities:  . Patient will take medications as prescribed with help from wife and pill packaging system . Patient will focus on improved adherence by allowing wife to assist with pill pack administration  Please see past updates related to this goal by clicking on the "Past Updates" button in the selected goal         Plan:   The care management team will reach out to the patient again over the next 30 days.   Provider Signature Regina Eck, PharmD, BCPS Clinical Pharmacist, [facility] [place]: [phone]

## 2019-12-19 LAB — CUP PACEART INCLINIC DEVICE CHECK
Battery Remaining Longevity: 104 mo
Battery Voltage: 2.99 V
Brady Statistic RA Percent Paced: 5.1 %
Brady Statistic RV Percent Paced: 99.73 %
Date Time Interrogation Session: 20210105133000
Implantable Lead Implant Date: 20180911
Implantable Lead Implant Date: 20180911
Implantable Lead Location: 753859
Implantable Lead Location: 753860
Implantable Pulse Generator Implant Date: 20180911
Lead Channel Impedance Value: 462.5 Ohm
Lead Channel Impedance Value: 462.5 Ohm
Lead Channel Pacing Threshold Amplitude: 0.5 V
Lead Channel Pacing Threshold Amplitude: 0.75 V
Lead Channel Pacing Threshold Pulse Width: 0.5 ms
Lead Channel Pacing Threshold Pulse Width: 0.5 ms
Lead Channel Sensing Intrinsic Amplitude: 12 mV
Lead Channel Sensing Intrinsic Amplitude: 5 mV
Lead Channel Setting Pacing Amplitude: 2 V
Lead Channel Setting Pacing Amplitude: 2.5 V
Lead Channel Setting Pacing Pulse Width: 0.5 ms
Lead Channel Setting Sensing Sensitivity: 4 mV
Pulse Gen Model: 2272
Pulse Gen Serial Number: 8940191

## 2019-12-23 ENCOUNTER — Encounter: Payer: Self-pay | Admitting: Internal Medicine

## 2019-12-23 ENCOUNTER — Ambulatory Visit: Payer: Medicare HMO | Admitting: Pharmacist

## 2019-12-23 ENCOUNTER — Ambulatory Visit (INDEPENDENT_AMBULATORY_CARE_PROVIDER_SITE_OTHER): Payer: Medicare HMO | Admitting: Internal Medicine

## 2019-12-23 ENCOUNTER — Other Ambulatory Visit: Payer: Self-pay

## 2019-12-23 VITALS — BP 116/82 | HR 73 | Temp 98.5°F | Ht 68.2 in | Wt 188.2 lb

## 2019-12-23 DIAGNOSIS — N1831 Chronic kidney disease, stage 3a: Secondary | ICD-10-CM

## 2019-12-23 DIAGNOSIS — E1121 Type 2 diabetes mellitus with diabetic nephropathy: Secondary | ICD-10-CM | POA: Diagnosis not present

## 2019-12-23 DIAGNOSIS — E663 Overweight: Secondary | ICD-10-CM

## 2019-12-23 DIAGNOSIS — R2689 Other abnormalities of gait and mobility: Secondary | ICD-10-CM

## 2019-12-23 DIAGNOSIS — Z6828 Body mass index (BMI) 28.0-28.9, adult: Secondary | ICD-10-CM

## 2019-12-23 DIAGNOSIS — I129 Hypertensive chronic kidney disease with stage 1 through stage 4 chronic kidney disease, or unspecified chronic kidney disease: Secondary | ICD-10-CM | POA: Diagnosis not present

## 2019-12-23 NOTE — Progress Notes (Signed)
This visit occurred during the SARS-CoV-2 public health emergency.  Safety protocols were in place, including screening questions prior to the visit, additional usage of staff PPE, and extensive cleaning of exam room while observing appropriate contact time as indicated for disinfecting solutions.  Subjective:     Patient ID: Dylan [name]. , male    DOB: October 28, 1941 , 79 y.o.   MRN: [id number]   Chief Complaint  Patient presents with  . Diabetes  . Hypertension    HPI  He presents today for DM check. He is accompanied by his wife. Neither offer any complaints/concerns today.   Diabetes He presents for his follow-up diabetic visit. He has type 2 diabetes mellitus. There are no hypoglycemic associated symptoms. Pertinent negatives for diabetes include no blurred vision and no chest pain. There are no hypoglycemic complications. Diabetic complications include nephropathy. Risk factors for coronary artery disease include diabetes mellitus, dyslipidemia, hypertension, sedentary lifestyle, male sex and obesity. He is compliant with treatment some of the time. He is following a diabetic diet. He participates in exercise intermittently. His breakfast blood glucose is taken between 9-10 am. His breakfast blood glucose range is generally 140-180 mg/dl. An ACE inhibitor/angiotensin II receptor blocker is being taken. He does not see a podiatrist.Eye exam is current.  Hypertension This is a chronic problem. The current episode started more than 1 year ago. The problem has been gradually improving since onset. The problem is controlled. Pertinent negatives include no blurred vision or chest pain. Risk factors for coronary artery disease include diabetes mellitus, dyslipidemia, male gender, obesity and sedentary lifestyle. There is no history of sleep apnea.     Past Medical History:  Diagnosis Date  . Anxiety   . Aortic stenosis    mild AS 11/24/16 (peak grad 24, mean grad 11) Dr. [name]  . CHB  (complete heart block) ([place]) 07/2017  . 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 Diabetes mellitus without complication ([place])   . Gout   . Hyperlipemia   . Hypertension   . 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 Memory loss   . Peripheral arterial disease ([place])   . 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 Second degree AV block    Wenckebach; no indication for pacemaker as of 12/01/16 (Dr. [name])  . Vitamin B12 deficiency anemia 03/01/2018  . Vitamin D deficiency disease      Family History  Problem Relation Age of Onset  . Diabetes Mother   . Breast cancer Mother   . Arthritis Mother   . Hypertension Father   . Diabetes Sister   . Diabetes Brother   . Diabetes Maternal Grandmother   . Diabetes Brother   . Diabetes Brother   . Diabetes Brother   . Diabetes Sister   . Diabetes Sister   . Diabetes Sister      Current Outpatient Medications:  .  allopurinol (ZYLOPRIM) 100 MG tablet, TAKE 1 TABLET BY MOUTH EVERY DAY, Disp: 90 tablet, Rfl: 1 .  amLODipine (NORVASC) 5 MG tablet, TAKE 1 TABLET BY MOUTH DAILY, Disp: 30 tablet, Rfl: 5 .  Ascorbic Acid (VITAMIN C) 1000 MG tablet, Take 1,000 mg by mouth daily., Disp: , Rfl:  .  aspirin 81 MG tablet, Take 81 mg by mouth 3 (three) times a week. , Disp: , Rfl:  .  carvedilol (COREG) 25 MG tablet, Take 1 tablet (25 mg total) by mouth 2 (two) times daily., Disp: 60 tablet, Rfl: 6 .  Cholecalciferol (VITAMIN D3) 5000 units CAPS, Take 5,000 Units by mouth every evening., Disp: , Rfl:  .  Coenzyme Q10 200 MG capsule, Take 200 mg by mouth daily., Disp: , Rfl:  .  diclofenac Sodium (VOLTAREN) 1 % GEL, Apply 2 g topically 3 (three) times daily., Disp: 150 g, Rfl: 0 .  glucose blood (ONETOUCH VERIO) test strip, Use as instructed to check blood sugars daily dx: e11.22, Disp: 150 each, Rfl: 3 .  Lancet Devices (ONE TOUCH DELICA LANCING DEV) MISC, Please fill ONE TOUCH DELICA LANCING DEVICE.  Use to test blood  sugar twice daily as directed. E11.65, Disp: 1 each, Rfl: 1 .  Lancets MISC, Please fill basic/generic push button lancets.  Patient to test blood sugar twice daily. DX: E11.65, Disp: 100 each, Rfl: 4 .  memantine (NAMENDA) 10 MG tablet, Take 1 tablet (10 mg total) by mouth 2 (two) times daily., Disp: 180 tablet, Rfl: 4 .  metFORMIN (GLUCOPHAGE) 1000 MG tablet, Take one tablet by mouth daily., Disp: 90 tablet, Rfl: 0 .  pravastatin (PRAVACHOL) 20 MG tablet, TAKE 1 TABLET BY ORAL ROUTE EVERY DAY, Disp: 30 tablet, Rfl: 2 .  TRULICITY 1.5 RS/8.5IO SOPN, INJECT 1.5 MG INTO THE SKIN ONCE A WEEK., Disp: 12 pen, Rfl: 1 .  valsartan (DIOVAN) 320 MG tablet, TAKE 1 TABLET BY MOUTH EVERY DAY, Disp: 30 tablet, Rfl: 8 .  COLCRYS 0.6 MG tablet, Take 1 tablet (0.6 mg total) by mouth daily. (Patient not taking: Reported on 12/23/2019), Disp: 90 tablet, Rfl: 1   No Known Allergies   Review of Systems  Constitutional: Negative.   Eyes: Negative for blurred vision.  Respiratory: Negative.   Cardiovascular: Negative.  Negative for chest pain.  Gastrointestinal: Negative.   Neurological: Negative.        He initially mentions having frequent falls, then states later that he has not had a fall in a long time. He is ambulatory with a cane.   Psychiatric/Behavioral: Negative.      Today's Vitals   12/23/19 1440  BP: 116/82  Pulse: 73  Temp: 98.5 F (36.9 C)  TempSrc: Oral  Weight: 188 lb 3.2 oz (85.4 kg)  Height: 5' 8.2" (1.732 m)  PainSc: 8   PainLoc: Head   Body mass index is 28.45 kg/m.   Objective:  Physical Exam Vitals and nursing note reviewed.  Constitutional:      Appearance: Normal appearance.  Cardiovascular:     Rate and Rhythm: Normal rate and regular rhythm.     Heart sounds: Normal heart sounds.  Pulmonary:     Effort: Pulmonary effort is normal.     Breath sounds: Normal breath sounds.  Skin:    General: Skin is warm.  Neurological:     General: No focal deficit present.      Mental Status: He is alert.     Comments: Shuffling gait  Psychiatric:        Mood and Affect: Mood normal.         Assessment And Plan:     1. Type 2 diabetes mellitus with stage 3a chronic kidney disease, without long-term current use of insulin (HCC)  Chronic. Most recent renal function reviewed, last drawn Dec 2020.   - Hemoglobin A1c  2. Hypertensive nephropathy  Chronic, well controlled. She will continue with current meds. He is encouraged to avoid adding salt to his foods.   3. Shuffling gait  I will refer him to Neuro for further evaluation. Both he  and his caregiver agree to referral.   - Ambulatory referral to Neurology  4. Overweight with body mass index (BMI) of 28 to 28.9 in adult  His weight is stable for his demographic.   Maximino Greenland, MD    THE PATIENT IS ENCOURAGED TO PRACTICE SOCIAL DISTANCING DUE TO THE COVID-19 PANDEMIC.

## 2019-12-23 NOTE — Progress Notes (Signed)
Chronic Care Management    Visit Note  12/27/2019 Name: Dylan [name]. MRN: [id number] DOB: Nov 12, 1940  Referred by: Dylan Chard, MD Reason for referral : Chronic Care Management   Dylan [name]. is a 79 y.o. year old male who is a primary care patient of Dylan Chard, MD. 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    SDOH (Social Determinants of Health) assessments performed: No    Medications: Outpatient Encounter Medications as of 12/23/2019  Medication Sig Note  . allopurinol (ZYLOPRIM) 100 MG tablet TAKE 1 TABLET BY MOUTH EVERY DAY   . amLODipine (NORVASC) 5 MG tablet TAKE 1 TABLET BY MOUTH DAILY   . Ascorbic Acid (VITAMIN C) 1000 MG tablet Take 1,000 mg by mouth daily.   [name] aspirin 81 MG tablet Take 81 mg by mouth 3 (three) times a week.    . carvedilol (COREG) 25 MG tablet Take 1 tablet (25 mg total) by mouth 2 (two) times daily.   . Cholecalciferol (VITAMIN D3) 5000 units CAPS Take 5,000 Units by mouth every evening.   . Coenzyme Q10 200 MG capsule Take 200 mg by mouth daily.   [name] COLCRYS 0.6 MG tablet Take 1 tablet (0.6 mg total) by mouth daily. (Patient not taking: Reported on 12/23/2019)   . diclofenac Sodium (VOLTAREN) 1 % GEL Apply 2 g topically 3 (three) times daily.   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 Lancet Devices (ONE TOUCH DELICA LANCING DEV) MISC Please fill ONE TOUCH DELICA LANCING DEVICE.  Use to test blood sugar twice daily as directed. E11.65   . Lancets MISC Please fill basic/generic push button lancets.  Patient  to test blood sugar twice daily. DX: E11.65   . memantine (NAMENDA) 10 MG tablet Take 1 tablet (10 mg total) by mouth 2 (two) times daily. 12/05/2019: Patient is only taking 1 tablet in the am but is holding the pm dose due to having headaches  . pravastatin (PRAVACHOL) 20 MG tablet TAKE 1 TABLET BY ORAL ROUTE EVERY DAY   . TRULICITY 1.5 YW/7.3XT SOPN INJECT 1.5 MG INTO THE SKIN ONCE A WEEK.   . valsartan (DIOVAN) 320 MG tablet TAKE 1 TABLET BY MOUTH EVERY DAY   . [DISCONTINUED] metFORMIN (GLUCOPHAGE) 1000 MG tablet Take one tablet by mouth daily.    No facility-administered encounter medications on file as of 12/23/2019.     Objective:   Goals Addressed            This Visit's Progress   . I would like to continue to optimize his medication management of my chronic conditions.       Spouse stated Current Barriers:  . Polypharmacy; complex patient with multiple comorbidities including dementia, diabetes, hypertension . Wife assists with managing medications via [facility] pill packaging system.  Patient continues to take pills out and sometimes does not take them.  Pharmacist Clinical Goal(s):  [name] Over the next 90 days, patient will work with PharmD and provider towards optimized medication management  Interventions: Clinic visit with patient and spouse 12/23/19 . Comprehensive medication review performed; medication list updated in electronic medical  record . Counseled wife on working to oversee pill pack administration as patient takes pills out at times and forgets to take them.  She continues to give patient the Namenda twice daily.  She has not seen a great benefit from this. . Reviewed & discussed the following diabetes-related information with patient: o Continue checking blood sugars as directed (twice daily if able) o Follow ADA recommended "diabetes-friendly" diet  (reviewed healthy snack/food options); Patient continues to eat desserts.  Wife states she is trying to limit, however  patient is headstrong at times. Discussed with her the likelihood of having to add more antidiabetes medications. - New one touch verio flex meter is not working well due to patient being unable to use lancing device - Will call in basic lancets--new lancing device is not working for patient/wife . [facility] [place] [facility] not [facility] and couldn't order).  These lancets and the "Safety push button" lancets that are single use disposable. . $8 for 84-month supply cash . Demonstrated how to use. Patient needed refresher on how to use lancets o Confirmed current DM regimen: Metformin once daily, Trulicity weekly (actos d/c'd due to edema/CKD) o Discussed GLP-1 injection technique.  Patient is self-administering per wife. o Reviewed medication purpose/side effects-->wife denies adverse events, denies hypoglycemia o Most recent A1c is 10.5% on 09/29/19 (continues to increase: 8.2% on 6/23, 7.5% on 3/23.  Wife working to give medications as prescribed, but receives resistance from patient as he is battling with dementia.  They are using compliance packaging by [facility] mail order.  This method appears to be the best method of organization/compliance. o Current anti-hypertensive regimen: carvedilol, amlodipine, valsartan o Current anti-hyperlipidemia regimen: pravastatin 20mg  daily o Continue taking all medications as prescribed by provider o 12/04/19: Patient is only taking memantine once daily vs twice daily due to headaches.  He states when he takes it twice daily, he has a headache.  When he omits night time memantine dose, he does not have headache - Can explore namenda XR version at next appt  CCM RN CM Interventions:  10/08/19 completed call with spouse Dylan [name]  Inbound call received from spouse Dylan [name] . Evaluation of current treatment plan related to Diabetes and patient's adherence to plan as established by provider. . Advised patient to stop by the  office to pick up his new glucometer; discussed the [facility] nurse will work with [name] and [name] on how to properly use the glucometer; discussed the embedded Pharm D Lottie Dawson is also available if needed and can scheduled a face to face OV if further instruction is needed . Discussed plans with patient for ongoing care management follow up and provided patient with direct contact information for care management team . Advised patient, providing education and rationale, to check cbg daily before meals and record, calling the CCM team and or PCP for findings outside established parameters.   . Discussed Mr. Frangos is riding his exercise bike daily in the evenings and is feeling better overall . Discussed [name] is waking him earlier to ensure he is checking his CBG, eating breakfast and taking his am medications . Discussed meal preparation and dinner time is now earlier in the evening - educated [name] on the importance of closely monitoring Mr. Turnage CBG's at home due to adding exercise to his daily routine can lower his CBG's - educated [name] on measures to take for hypoglycemia  . Mailed printed  signs/symptoms hypo/hyperglycemia reference sheet  Patient Self Care Activities:  . Patient will take medications as prescribed with help from wife and pill packaging system . Patient will focus on improved adherence by allowing wife to assist with pill pack administration  Please see past updates related to this goal by clicking on the "Past Updates" button in the selected goal           Plan:   The care management team will reach out to the patient again over the next 60 days.   Provider Signature   Regina Eck, PharmD, BCPS Clinical Pharmacist, [facility] [place]: [phone]

## 2019-12-24 LAB — HEMOGLOBIN A1C
Est. average glucose Bld gHb Est-mCnc: 186 mg/dL
Hgb A1c MFr Bld: 8.1 % — ABNORMAL HIGH (ref 4.8–5.6)

## 2019-12-25 ENCOUNTER — Telehealth: Payer: Self-pay

## 2019-12-25 ENCOUNTER — Ambulatory Visit (INDEPENDENT_AMBULATORY_CARE_PROVIDER_SITE_OTHER): Payer: Medicare HMO | Admitting: *Deleted

## 2019-12-25 DIAGNOSIS — I442 Atrioventricular block, complete: Secondary | ICD-10-CM

## 2019-12-25 LAB — CUP PACEART REMOTE DEVICE CHECK
Battery Remaining Longevity: 104 mo
Battery Remaining Percentage: 95.5 %
Battery Voltage: 2.99 V
Brady Statistic AP VP Percent: 12 %
Brady Statistic AP VS Percent: 1 %
Brady Statistic AS VP Percent: 88 %
Brady Statistic AS VS Percent: 1 %
Brady Statistic RA Percent Paced: 11 %
Brady Statistic RV Percent Paced: 99 %
Date Time Interrogation Session: 20210222020013
Implantable Lead Implant Date: 20180911
Implantable Lead Implant Date: 20180911
Implantable Lead Location: 753859
Implantable Lead Location: 753860
Implantable Pulse Generator Implant Date: 20180911
Lead Channel Impedance Value: 440 Ohm
Lead Channel Impedance Value: 450 Ohm
Lead Channel Pacing Threshold Amplitude: 0.5 V
Lead Channel Pacing Threshold Amplitude: 0.75 V
Lead Channel Pacing Threshold Pulse Width: 0.5 ms
Lead Channel Pacing Threshold Pulse Width: 0.5 ms
Lead Channel Sensing Intrinsic Amplitude: 12 mV
Lead Channel Sensing Intrinsic Amplitude: 5 mV
Lead Channel Setting Pacing Amplitude: 2 V
Lead Channel Setting Pacing Amplitude: 2.5 V
Lead Channel Setting Pacing Pulse Width: 0.5 ms
Lead Channel Setting Sensing Sensitivity: 4 mV
Pulse Gen Model: 2272
Pulse Gen Serial Number: 8940191

## 2019-12-25 NOTE — Telephone Encounter (Signed)
Unable to leave vm- needs lab results

## 2019-12-25 NOTE — Telephone Encounter (Signed)
-----   Message from [name], MD sent at 12/24/2019  7:47 PM EST ----- Your hba1c has improved, down to 8.1 from 10.5/ keep up the great work! I am proud of you! Cutting back on your intake of processed foods and sweets has helped immensely!

## 2019-12-26 ENCOUNTER — Other Ambulatory Visit: Payer: Self-pay | Admitting: Internal Medicine

## 2019-12-26 NOTE — Progress Notes (Signed)
PPM Remote  

## 2019-12-27 NOTE — Patient Instructions (Signed)
Visit Information  Goals Addressed            This Visit's Progress   . I would like to continue to optimize his medication management of my chronic conditions.       Spouse stated Current Barriers:  . Polypharmacy; complex patient with multiple comorbidities including dementia, diabetes, hypertension . Wife assists with managing medications via [facility] pill packaging system.  Patient continues to take pills out and sometimes does not take them.  Pharmacist Clinical Goal(s):  [name] Over the next 90 days, patient will work with PharmD and provider towards optimized medication management  Interventions: Clinic visit with patient and spouse 12/23/19 . Comprehensive medication review performed; medication list updated in electronic medical record . Counseled wife on working to oversee pill pack administration as patient takes pills out at times and forgets to take them.  She continues to give patient the Namenda twice daily.  She has not seen a great benefit from this. . Reviewed & discussed the following diabetes-related information with patient: o Continue checking blood sugars as directed (twice daily if able) o Follow ADA recommended "diabetes-friendly" diet  (reviewed healthy snack/food options); Patient continues to eat desserts.  Wife states she is trying to limit, however patient is headstrong at times. Discussed with her the likelihood of having to add more antidiabetes medications. - New one touch verio flex meter is not working well due to patient being unable to use lancing device - Will call in basic lancets--new lancing device is not working for patient/wife . [facility] [place] did not have in stock and couldn't order).  These lancets and the "Safety push button" lancets that are single use disposable. . $8 for 87-month supply cash . Demonstrated how to use. Patient needed refresher on how to use lancets o Confirmed current DM regimen: Metformin once daily,  Trulicity weekly (actos d/c'd due to edema/CKD) o Discussed GLP-1 injection technique.  Patient is self-administering per wife. o Reviewed medication purpose/side effects-->wife denies adverse events, denies hypoglycemia o Most recent A1c is 10.5% on 09/29/19 (continues to increase: 8.2% on 6/23, 7.5% on 3/23.  Wife working to give medications as prescribed, but receives resistance from patient as he is battling with dementia.  They are using compliance packaging by [facility] mail order.  This method appears to be the best method of organization/compliance. o Current anti-hypertensive regimen: carvedilol, amlodipine, valsartan o Current anti-hyperlipidemia regimen: pravastatin 20mg  daily o Continue taking all medications as prescribed by provider o 12/04/19: Patient is only taking memantine once daily vs twice daily due to headaches.  He states when he takes it twice daily, he has a headache.  When he omits night time memantine dose, he does not have headache - Can explore namenda XR version at next appt  CCM RN CM Interventions:  10/08/19 completed call with spouse [name]  Inbound call received from spouse [name] . Evaluation of current treatment plan related to Diabetes and patient's adherence to plan as established by provider. . Advised patient to stop by the office to pick up his new glucometer; discussed the [facility] nurse will work with Mr. Mathers and [name] on how to properly use the glucometer; discussed the embedded Pharm D Lottie Dawson is also available if needed and can scheduled a face to face OV if further instruction is needed . Discussed plans with patient for ongoing care management follow up and provided patient with direct contact information for care management team .  Advised patient, providing education and rationale, to check cbg daily before meals and record, calling the CCM team and or PCP for findings outside established parameters.   . Discussed Mr. Tangen is  riding his exercise bike daily in the evenings and is feeling better overall . Discussed [name] is waking him earlier to ensure he is checking his CBG, eating breakfast and taking his am medications . Discussed meal preparation and dinner time is now earlier in the evening - educated [name] on the importance of closely monitoring Mr. Georg CBG's at home due to adding exercise to his daily routine can lower his CBG's - educated [name] on measures to take for hypoglycemia  . Mailed printed signs/symptoms hypo/hyperglycemia reference sheet  Patient Self Care Activities:  . Patient will take medications as prescribed with help from wife and pill packaging system . Patient will focus on improved adherence by allowing wife to assist with pill pack administration  Please see past updates related to this goal by clicking on the "Past Updates" button in the selected goal         The patient verbalized understanding of instructions provided today and declined a print copy of patient instruction materials.   The care management team will reach out to the patient again over the next 60 days.   SIGNATURE Regina Eck, PharmD, BCPS Clinical Pharmacist, [facility] [place]: [phone]

## 2020-01-06 ENCOUNTER — Other Ambulatory Visit: Payer: Self-pay

## 2020-01-06 MED ORDER — ONETOUCH VERIO VI STRP: ORAL_STRIP | 3 refills | Status: AC

## 2020-01-16 ENCOUNTER — Ambulatory Visit (INDEPENDENT_AMBULATORY_CARE_PROVIDER_SITE_OTHER): Payer: Medicare HMO

## 2020-01-16 ENCOUNTER — Telehealth: Payer: Medicare HMO

## 2020-01-16 ENCOUNTER — Other Ambulatory Visit: Payer: Self-pay

## 2020-01-16 DIAGNOSIS — E1121 Type 2 diabetes mellitus with diabetic nephropathy: Secondary | ICD-10-CM

## 2020-01-16 DIAGNOSIS — I129 Hypertensive chronic kidney disease with stage 1 through stage 4 chronic kidney disease, or unspecified chronic kidney disease: Secondary | ICD-10-CM

## 2020-01-16 DIAGNOSIS — N183 Chronic kidney disease, stage 3 unspecified: Secondary | ICD-10-CM

## 2020-01-16 DIAGNOSIS — I10 Essential (primary) hypertension: Secondary | ICD-10-CM | POA: Diagnosis not present

## 2020-01-16 DIAGNOSIS — F0391 Unspecified dementia with behavioral disturbance: Secondary | ICD-10-CM

## 2020-01-16 DIAGNOSIS — N1831 Chronic kidney disease, stage 3a: Secondary | ICD-10-CM

## 2020-01-16 DIAGNOSIS — E1122 Type 2 diabetes mellitus with diabetic chronic kidney disease: Secondary | ICD-10-CM

## 2020-01-17 NOTE — Chronic Care Management (AMB) (Signed)
Chronic Care Management   Follow Up Note   01/16/2020 Name: Dylan [name]. MRN: [id number] DOB: [birth year]  Referred by: Dylan Chard, MD Reason for referral : Chronic Care Management ([facility] Inbound Call - dementia with behavior changes)   Dylan [name]. is a 79 y.o. year old male who is a primary care patient of Dylan Chard, MD. The CCM team was consulted for assistance with chronic disease management and care coordination needs.    Review of patient status, including review of consultants reports, relevant laboratory and other test results, and collaboration with appropriate care team members and the patient's provider was performed as part of comprehensive patient evaluation and provision of chronic care management services.    SDOH (Social Determinants of Health) assessments performed: No See Care Plan activities for detailed interventions related to [place])   Inbound call received from spouse Dylan [name] to report new and worsening symptoms related to Dylan [name] cognitive and behavior changes.     Outpatient Encounter Medications as of 01/16/2020  Medication Sig Note  . allopurinol (ZYLOPRIM) 100 MG tablet TAKE 1 TABLET BY MOUTH EVERY DAY   . amLODipine (NORVASC) 5 MG tablet TAKE 1 TABLET BY MOUTH DAILY   . Ascorbic Acid (VITAMIN C) 1000 MG tablet Take 1,000 mg by mouth daily.   [name] aspirin 81 MG tablet Take 81 mg by mouth 3 (three) times a week.    . carvedilol (COREG) 25 MG tablet Take 1 tablet (25 mg total) by mouth 2 (two) times daily.   . Cholecalciferol (VITAMIN D3) 5000 units CAPS Take 5,000 Units by mouth every evening.   . Coenzyme Q10 200 MG capsule Take 200 mg by mouth daily.   [name] COLCRYS 0.6 MG tablet Take 1 tablet (0.6 mg total) by mouth daily. (Patient not taking: Reported on 12/23/2019)   . diclofenac Sodium (VOLTAREN) 1 % GEL Apply 2 g topically 3 (three) times daily.   [name] glucose blood (ONETOUCH VERIO) test strip Use as instructed to check blood sugars daily dx:  e11.22   . Lancet Devices (ONE TOUCH DELICA LANCING DEV) MISC Please fill ONE TOUCH DELICA LANCING DEVICE.  Use to test blood sugar twice daily as directed. E11.65   . Lancets MISC Please fill basic/generic push button lancets.  Patient to test blood sugar twice daily. DX: E11.65   . memantine (NAMENDA) 10 MG tablet Take 1 tablet (10 mg total) by mouth 2 (two) times daily. 12/05/2019: Patient is only taking 1 tablet in the am but is holding the pm dose due to having headaches  . metFORMIN (GLUCOPHAGE) 1000 MG tablet TAKE 1 TABLET BY MOUTH EVERY DAY   . pravastatin (PRAVACHOL) 20 MG tablet TAKE 1 TABLET BY ORAL ROUTE EVERY DAY   . TRULICITY 1.5 IO/0.3TD SOPN INJECT 1.5 MG INTO THE SKIN ONCE A WEEK.   . valsartan (DIOVAN) 320 MG tablet TAKE 1 TABLET BY MOUTH EVERY DAY    No facility-administered encounter medications on file as of 01/16/2020.     Objective:  Lab Results  Component Value Date   HGBA1C 8.1 (H) 12/23/2019   HGBA1C 10.5 (H) 09/19/2019   HGBA1C 8.3 (H) 07/24/2019   Lab Results  Component Value Date   MICROALBUR 80 01/22/2019   LDLCALC 66 07/24/2019   CREATININE 1.47 (H) 10/17/2019   BP Readings from Last 3 Encounters:  12/23/19 116/82  11/05/19 136/68  10/20/19 (!) 161/67    Goals Addressed    . "I don't know what the next steps  are for Dylan [name] Dementia"       Spouse stated  Current Barriers:  [name] Knowledge Deficits related to next steps for diagnosis and treatment for new diagnosis of Dementia . Chronic Disease Management support and education needs related to DMII, Essential HTN, CKDIII, Dementia with behavior changes  Nurse Case Manager Clinical Goal(s):  [name] Over the next 14 days, spouse will collaborative with the Neuro office and receive instructions regarding brain imaging ordered for determination of type of Dementia - Goal Met  . Over the next 90 days, patient will verbalize understanding of plan for treatment management for new dx: Dementia  Goal Met .  01/16/20 New Over the next 14 days, patient will complete a follow up visit with Dylan [name], neurologist and spouse will report all new or worsening symptoms related to cognitive and behavior changes . 01/16/20 New Over the next 30 days, patient and spouse will verbalize having a good understanding of the treatment plan recommended by Dylan [name] and will adhere to her recommendations . 01/16/20 New Over the next 90 days, spouse will be able to provide the care needed to patient without difficulty or caregiver burnout AEB spouse will report feeling satisfied with the care she is able to provide Dylan [name] and will verbalize having all resources needed  CCM RN CM Interventions:  01/16/20: call completed with spouse Dylan [name]  . Inbound call received from spouse [name] to discuss ongoing concerns about Dylan [name] dementia  . Determined Dylan [name] is experiencing slower processing with delayed physical reactions ("it takes him a while to ungrip things he is holding"),  . Determined Dylan [name] is experiencing slurred speech making it difficult for his spouse to understand him at times . Determined Dylan [name] continues to have a shuffling gait and poor balance . Reviewed medications previously prescribed for his dementia; spouse reports the patient is not taking his nighttime dose of Namenda due to having headaches after taking this dose of medication; Dylan [name] is aware - spouse feels his slow processing has worsened since stopping the nighttime dose of Namenda . Discussed and reviewed his  next scheduled Neuro follow up with Dylan [name] is scheduled for 01/29/20 @ 7:30 AM . Encouraged spouse to report all new symptoms to Dylan [name]; suggested Dylan [name] recording his symptoms in a journal including any pertinent details for Dylan [name] to review at next visit . Discussed it is sometimes difficult for Dylan [name] to report symptoms and discuss her concerns to Mr. Gladish healthcare providers due to Mr. Medlen  gets angry at her for doing so . Sent an in basket message to [name] NP, [facility] to advise of the reported symptoms provided by spouse [name] . Discussed plans with Dylan [name] for ongoing care management follow up and confirmed she has the direct contact information for care management team  06/10/19 Reviewed brain CT image results from 06/07/19 as ordered by Neurologist, Dylan [name]  Noted the following IMPRESSION:  This CT scan of the head without contrast shows the following: 1.   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 2.   Mild chronic microvascular ischemic changes. 3.   There are no acute findings.  Patient Self Care Activities with assistance/supervision from spouse . Self administers medications as prescribed . Attends all scheduled provider appointments . Performs ADL's independently . Performs IADL's independently  Please see past updates related to this goal by clicking on the "Past Updates" button in the  selected goal         Plan:   Telephone follow up appointment with care management team member scheduled for:01/22/20   [name], RN, BSN, CCM Care Management Coordinator [place] [facility]  Direct Phone: [phone]

## 2020-01-17 NOTE — Patient Instructions (Addendum)
Visit Information  Goals Addressed    . "I don't know what the next steps are for Dylan [name] Dementia"       Spouse stated  Current Barriers:  [name] Knowledge Deficits related to next steps for diagnosis and treatment for new diagnosis of Dementia . Chronic Disease Management support and education needs related to DMII, Essential HTN, CKDIII, Dementia with behavior changes  Nurse Case Manager Clinical Goal(s):  [name] Over the next 14 days, spouse will collaborative with the Neuro office and receive instructions regarding brain imaging ordered for determination of type of Dementia - Goal Met  . Over the next 90 days, patient will verbalize understanding of plan for treatment management for new dx: Dementia  Goal Met . 01/16/20 New Over the next 14 days, patient will complete a follow up visit with Dr. Krista Blue, neurologist and spouse will report all new or worsening symptoms related to cognitive and behavior changes . 01/16/20 New Over the next 30 days, patient and spouse will verbalize having a good understanding of the treatment plan recommended by Dr. Krista Blue and will adhere to her recommendations . 01/16/20 New Over the next 90 days, spouse will be able to provide the care needed to patient without difficulty or caregiver burnout AEB spouse will report feeling satisfied with the care she is able to provide Dylan [name] and will verbalize having all resources needed  CCM RN CM Interventions:  01/16/20: call completed with spouse Dylan [name]  . Inbound call received from spouse Dylan [name] to discuss ongoing concerns about Dylan [name] dementia  . Determined Dylan [name] is experiencing slower processing with delayed physical reactions ("it takes him a while to ungrip things he is holding"),and he often has difficulty feeding himself, "the food doesn't make it to his mouth due to having poor hand control and tremors when trying to self feed"  . Determined Dylan [name] is experiencing slurred speech making it  difficult for his spouse to understand him at times . Determined Dylan [name] continues to have a slow shuffling gait and poor balance . Reviewed medications previously prescribed for his dementia; spouse reports the patient is not taking his nighttime dose of Namenda due to having headaches after taking this dose of medication; Dr. Krista Blue is aware - spouse feels his slow processing has worsened since stopping the nighttime dose of Namenda, she reports "he often looks at me with confusion and becomes angry when I cannot understand what it is he needs from me" . Discussed and reviewed his  next scheduled Neuro follow up with Dr. Krista Blue is scheduled for 01/29/20 @ 7:30 AM . Encouraged spouse to report all new symptoms to Dr. Krista Blue; suggested Dylan [name] recording his symptoms in a journal including any pertinent details for Dr. Krista Blue to review at next visit . Discussed it is sometimes difficult for Dylan [name] to report symptoms and discuss her concerns to Dylan [name] healthcare providers due to Dylan [name] gets angry at her for doing so . Sent an in basket message to [name], NP, Neuro to advise of the reported symptoms provided by spouse Dylan [name] . Discussed plans with Dylan [name] for ongoing care management follow up and confirmed she has the direct contact information for care management team  06/10/19 Reviewed brain CT image results from 06/07/19 as ordered by Neurologist, Dr. Krista Blue  Noted the following IMPRESSION:  This CT scan of the head without contrast shows the following: 1.   Mild cortical atrophy mostly in the mesial temporal lobes  and parietal lobes. 2.   Mild chronic microvascular ischemic changes. 3.   There are no acute findings.  Patient Self Care Activities with assistance/supervision from spouse . Self administers medications as prescribed . Attends all scheduled provider appointments . Performs ADL's independently . Performs IADL's independently  Please see past updates  related to this goal by clicking on the "Past Updates" button in the selected goal        Patient verbalizes understanding of instructions provided today.   Telephone follow up appointment with care management team member scheduled for:01/22/20  [name], RN, BSN, CCM Care Management Coordinator [place] [facility]  Direct Phone: [phone]

## 2020-01-20 ENCOUNTER — Telehealth: Payer: Medicare HMO

## 2020-01-22 ENCOUNTER — Ambulatory Visit: Payer: Self-pay

## 2020-01-22 ENCOUNTER — Ambulatory Visit: Payer: Self-pay | Admitting: Pharmacist

## 2020-01-22 ENCOUNTER — Other Ambulatory Visit: Payer: Self-pay

## 2020-01-22 ENCOUNTER — Telehealth: Payer: Medicare HMO

## 2020-01-22 DIAGNOSIS — N1831 Chronic kidney disease, stage 3a: Secondary | ICD-10-CM

## 2020-01-22 DIAGNOSIS — F0391 Unspecified dementia with behavioral disturbance: Secondary | ICD-10-CM

## 2020-01-22 DIAGNOSIS — I10 Essential (primary) hypertension: Secondary | ICD-10-CM

## 2020-01-22 DIAGNOSIS — N183 Chronic kidney disease, stage 3 unspecified: Secondary | ICD-10-CM

## 2020-01-22 NOTE — Progress Notes (Signed)
Chronic Care Management   Visit Note  01/22/2020 Name: Dylan [name]. MRN: [id number] DOB: 10-24-1941  Referred by: [name], MD Reason for referral : Chronic Care Management and Dementia   Dylan [name]. is a 79 y.o. year old male who is a primary care patient of [name], MD. The CCM team was consulted for assistance with chronic disease management and care coordination needs related to Dementia  Review of patient status, including review of consultants reports, relevant laboratory and other test results, and collaboration with appropriate care team members and the patient's provider was performed as part of comprehensive patient evaluation and provision of chronic care management services.    SDOH (Social Determinants of Health) assessments performed: No See Care Plan activities for detailed interventions related to SDOH     Medications: Outpatient Encounter Medications as of 01/22/2020  Medication Sig Note  . allopurinol (ZYLOPRIM) 100 MG tablet TAKE 1 TABLET BY MOUTH EVERY DAY   . amLODipine (NORVASC) 5 MG tablet TAKE 1 TABLET BY MOUTH DAILY   . Ascorbic Acid (VITAMIN C) 1000 MG tablet Take 1,000 mg by mouth daily.   [name] aspirin 81 MG tablet Take 81 mg by mouth 3 (three) times a week.    . carvedilol (COREG) 25 MG tablet Take 1 tablet (25 mg total) by mouth 2 (two) times daily.   . Cholecalciferol (VITAMIN D3) 5000 units CAPS Take 5,000 Units by mouth every evening.   . Coenzyme Q10 200 MG capsule Take 200 mg by mouth daily.   [name] COLCRYS 0.6 MG tablet Take 1 tablet (0.6 mg total) by mouth daily. (Patient not taking: Reported on 12/23/2019)   . diclofenac Sodium (VOLTAREN) 1 % GEL Apply 2 g topically 3 (three) times daily.   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 Lancet Devices (ONE TOUCH DELICA LANCING DEV) MISC Please fill ONE TOUCH DELICA LANCING DEVICE.  Use to test blood sugar twice daily as  directed. E11.65   . Lancets MISC Please fill basic/generic push button lancets.  Patient to test blood sugar twice daily. DX: E11.65   . memantine (NAMENDA) 10 MG tablet Take 1 tablet (10 mg total) by mouth 2 (two) times daily. 12/05/2019: Patient is only taking 1 tablet in the am but is holding the pm dose due to having headaches  . metFORMIN (GLUCOPHAGE) 1000 MG tablet TAKE 1 TABLET BY MOUTH EVERY DAY   . pravastatin (PRAVACHOL) 20 MG tablet TAKE 1 TABLET BY ORAL ROUTE EVERY DAY   . TRULICITY 1.5 ZY/6.0YT SOPN INJECT 1.5 MG INTO THE SKIN ONCE A WEEK.   . valsartan (DIOVAN) 320 MG tablet TAKE 1 TABLET BY MOUTH EVERY DAY    No facility-administered encounter medications on file as of 01/22/2020.     Objective:   Goals Addressed            This Visit's Progress   . I would like to continue to optimize his medication management of my chronic conditions.       Spouse stated Current Barriers:  . Polypharmacy; complex patient with multiple comorbidities including dementia, diabetes, hypertension . Wife assists with managing medications via [facility] pill packaging system.  Patient continues to take pills out and sometimes does not take them.  Pharmacist Clinical Goal(s):  [name] Over the next 90 days, patient will work with PharmD and provider towards optimized medication management  Interventions: . Comprehensive medication review performed; medication  list updated in electronic medical record . Counseled wife on working to oversee pill pack administration as patient takes pills out at times and forgets to take them.  She continues to give patient the Namenda twice daily.  She has not seen a great benefit from this. . Reviewed & discussed the following diabetes-related information with patient: o Continue checking blood sugars as directed (twice daily if able) o Follow ADA recommended "diabetes-friendly" diet  (reviewed healthy snack/food options); Patient continues to eat desserts.  Wife states she  is trying to limit, however patient is headstrong at times. Discussed with her the likelihood of having to add more antidiabetes medications. - New one touch verio flex meter is not working well due to patient being unable to use lancing device - Will call in basic lancets--new lancing device is not working for patient/wife . [facility]ets [facility] [place] [facility] not [facility] in [facility] and couldn't order).  These lancets and the "Safety push button" lancets that are single use disposable. . $8 for 28-month supply cash . Demonstrated how to use. Patient needed refresher on how to use lancets o Confirmed current DM regimen: Metformin once daily, Trulicity weekly (actos d/c'd due to edema/CKD) o Discussed GLP-1 injection technique.  Patient is self-administering per wife. o Reviewed medication purpose/side effects-->wife denies adverse events, denies hypoglycemia o Most recent A1c is 10.5% on 09/29/19 (continues to increase: 8.2% on 6/23, 7.5% on 3/23.  Wife working to give medications as prescribed, but receives resistance from patient as he is battling with dementia.  They are using compliance packaging by [facility] mail order.  This method appears to be the best method of organization/compliance. o Current anti-hypertensive regimen: carvedilol, amlodipine, valsartan o Current anti-hyperlipidemia regimen: pravastatin 20mg  daily o Continue taking all medications as prescribed by provider o 12/04/19: Patient is only taking memantine once daily vs twice daily due to headaches.  He states when he takes it twice daily, he has a headache.  When he omits night time memantine dose, he does not have headache - 01/22/20-Message sent to CCM RNCM to assist with namenda IR to XR conversion--When switching from the immediate-release (IR) tablets to the extended-release (ER) capsules, the following conversion is recommended: 10 mg twice daily of the IR tablets should be converted to 28 mg once daily of the  ER capsules beginning the day after the last dose of the IR tablet. Patients with severe renal impairment receiving 5 mg twice daily of the IR tablets may be converted to the ER capsules at a dose of 14 mg once daily beginning the day after the last dose of the IR  - Patient sees neuro, therefore neuro to adjust RX  CCM RN CM Interventions:  10/08/19 completed call with spouse [name]  Inbound call received from spouse [name] . Evaluation of current treatment plan related to Diabetes and patient's adherence to plan as established by provider. . Advised patient to stop by the office to pick up his new glucometer; discussed the [facility] nurse will work with Mr. Macha and [name] on how to properly use the glucometer; discussed the embedded Pharm D Lottie Dawson is also available if needed and can scheduled a face to face OV if further instruction is needed . Discussed plans with patient for ongoing care management follow up and provided patient with direct contact information for care management team . Advised patient, providing education and rationale, to check cbg daily before meals and record, calling the  CCM team and or PCP for findings outside established parameters.   . Discussed Mr. Reede is riding his exercise bike daily in the evenings and is feeling better overall . Discussed [name] is waking him earlier to ensure he is checking his CBG, eating breakfast and taking his am medications . Discussed meal preparation and dinner time is now earlier in the evening - educated [name] on the importance of closely monitoring Mr. Leinen CBG's at home due to adding exercise to his daily routine can lower his CBG's - educated [name] on measures to take for hypoglycemia  . Mailed printed signs/symptoms hypo/hyperglycemia reference sheet  Patient Self Care Activities:  . Patient will take medications as prescribed with help from wife and pill packaging system . Patient will focus  on improved adherence by allowing wife to assist with pill pack administration  Please see past updates related to this goal by clicking on the "Past Updates" button in the selected goal          Provider Signature   Regina Eck, PharmD, [place] Pharmacist, [facility] [place]: [phone]

## 2020-01-23 NOTE — Chronic Care Management (AMB) (Signed)
  Chronic Care Management   Outreach Note  01/23/2020 Name: Dylan [name]. MRN: [id number] DOB: 01/05/1941  Referred by: [name], MD Reason for referral : Chronic Care Management ([facility] Inbound Call from spouse)   An unsuccessful telephone outreach was attempted today. The patient was referred to the case management team for assistance with care management and care coordination. Spoke with spouse [name] who states this is not a good time to speak with me, she will give me a call back.   Follow Up Plan: Telephone follow up appointment with care management team member scheduled for:01/28/20  [name], RN, BSN, CCM Care Management Coordinator [place] [facility]  Direct Phone: [phone]

## 2020-01-24 ENCOUNTER — Telehealth: Payer: Medicare HMO

## 2020-01-24 ENCOUNTER — Ambulatory Visit: Payer: Self-pay

## 2020-01-24 ENCOUNTER — Other Ambulatory Visit: Payer: Self-pay

## 2020-01-24 DIAGNOSIS — F0391 Unspecified dementia with behavioral disturbance: Secondary | ICD-10-CM

## 2020-01-24 DIAGNOSIS — N1831 Chronic kidney disease, stage 3a: Secondary | ICD-10-CM | POA: Diagnosis not present

## 2020-01-24 DIAGNOSIS — I10 Essential (primary) hypertension: Secondary | ICD-10-CM

## 2020-01-24 DIAGNOSIS — N183 Chronic kidney disease, stage 3 unspecified: Secondary | ICD-10-CM | POA: Diagnosis not present

## 2020-01-27 NOTE — Patient Instructions (Signed)
Visit Information  Goals Addressed    . "I don't know what the next steps are for Mr. Charland Dementia"       Spouse stated  Current Barriers:  [name] Knowledge Deficits related to next steps for diagnosis and treatment for new diagnosis of Dementia . Chronic Disease Management support and education needs related to DMII, Essential HTN, CKDIII, Dementia with behavior changes  Nurse Case Manager Clinical Goal(s):  [name] Over the next 14 days, spouse will collaborative with the Neuro office and receive instructions regarding brain imaging ordered for determination of type of Dementia - Goal Met  . Over the next 90 days, patient will verbalize understanding of plan for treatment management for new dx: Dementia  Goal Met . 01/16/20 New Over the next 14 days, patient will complete a follow up visit with Dr. Krista Blue, neurologist and spouse will report all new or worsening symptoms related to cognitive and behavior changes . 01/16/20 New Over the next 30 days, patient and spouse will verbalize having a good understanding of the treatment plan recommended by Dr. Krista Blue and will adhere to her recommendations . 01/16/20 New Over the next 90 days, spouse will be able to provide the care needed to patient without difficulty or caregiver burnout AEB spouse will report feeling satisfied with the care she is able to provide Mr. Daughety and will verbalize having all resources needed  CCM RN CM Interventions:  01/24/20: call completed with spouse [name] . Inbound call received from spouse [name] to discuss ongoing concerns about Mr. Schoon dementia  . Informed spouse [name], a message was sent to Scott with reported symptoms, advised no reply yet received . Reviewed and discussed Pharm D recommendations for Namenda regarding d/c of IR tablet and transition to ER tablet and or starting Exelon patch . Discussed and reviewed his  next scheduled Neuro follow up with Dr. Krista Blue is scheduled for 01/29/20 @ 7:30  AM . Reinforced importance of spouse reporting all new symptoms to Dr. Krista Blue; suggested [name] recording his symptoms in a journal including any pertinent details for Dr. Krista Blue to review at next visit . Discussed plans with [name] for ongoing care management follow up and confirmed she has the direct contact information for care management team 01/27/20 Placed outbound call to [facility], spoke with Oliver Hum RN  . Infomred Katharine Look RN regarding reported symptoms by spouse  . Forwarded in [employer] previously sent to [name] NP, to Oliver Hum RN . Discussed embedded Pharm D recommendations for dosage change of Namenda, transitioning to ER 24 hr Namenda and or switching to Exelon patch . Discussed Katharine Look will provide Dr. Krista Blue and Judson Roch FNP an update regarding spouse reported symtpoms and recommendations for change with Namenda  06/10/19 Reviewed brain CT image results from 06/07/19 as ordered by Neurologist, Dr. Krista Blue  Noted the following IMPRESSION:  This CT scan of the head without contrast shows the following: 1.   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 2.   Mild chronic microvascular ischemic changes. 3.   There are no acute findings.  Patient Self Care Activities with assistance/supervision from spouse . Self administers medications as prescribed . Attends all scheduled provider appointments . Performs ADL's independently . Performs IADL's independently  Please see past updates related to this goal by clicking on the "Past Updates" button in the selected goal        Patient verbalizes understanding of instructions provided today.  Telephone follow up appointment with care management team member scheduled for: 01/30/20  [name], RN, BSN, CCM Care Management Coordinator [place] [facility]  Direct Phone: [phone]

## 2020-01-27 NOTE — Chronic Care Management (AMB) (Signed)
Chronic Care Management   Follow Up Note   01/27/2020 Name: Dylan [name]. MRN: [id number] DOB: 06/04/1941  Referred by: [name], MD Reason for referral : Chronic Care Management (Inbound Call - falls/Dementia)   Dylan [name]. is a 79 y.o. year old male who is a primary care patient of [name], MD. The CCM team was consulted for assistance with chronic disease management and care coordination needs.    Review of patient status, including review of consultants reports, relevant laboratory and other test results, and collaboration with appropriate care team members and the patient's provider was performed as part of comprehensive patient evaluation and provision of chronic care management services.    SDOH (Social Determinants of Health) assessments performed: Yes - No new needs identified See Care Plan activities for detailed interventions related to [facility])      Outpatient Encounter Medications as of 01/24/2020  Medication Sig Note  . allopurinol (ZYLOPRIM) 100 MG tablet TAKE 1 TABLET BY MOUTH EVERY DAY   . amLODipine (NORVASC) 5 MG tablet TAKE 1 TABLET BY MOUTH DAILY   . Ascorbic Acid (VITAMIN C) 1000 MG tablet Take 1,000 mg by mouth daily.   [name] aspirin 81 MG tablet Take 81 mg by mouth 3 (three) times a week.    . carvedilol (COREG) 25 MG tablet Take 1 tablet (25 mg total) by mouth 2 (two) times daily.   . Cholecalciferol (VITAMIN D3) 5000 units CAPS Take 5,000 Units by mouth every evening.   . Coenzyme Q10 200 MG capsule Take 200 mg by mouth daily.   [name] COLCRYS 0.6 MG tablet Take 1 tablet (0.6 mg total) by mouth daily. (Patient not taking: Reported on 12/23/2019)   . diclofenac Sodium (VOLTAREN) 1 % GEL Apply 2 g topically 3 (three) times daily.   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 Lancet Devices (ONE TOUCH DELICA LANCING DEV) MISC Please fill ONE TOUCH DELICA LANCING DEVICE.  Use to test blood  sugar twice daily as directed. E11.65   . Lancets MISC Please fill basic/generic push button lancets.  Patient to test blood sugar twice daily. DX: E11.65   . memantine (NAMENDA) 10 MG tablet Take 1 tablet (10 mg total) by mouth 2 (two) times daily. 12/05/2019: Patient is only taking 1 tablet in the am but is holding the pm dose due to having headaches  . metFORMIN (GLUCOPHAGE) 1000 MG tablet TAKE 1 TABLET BY MOUTH EVERY DAY   . pravastatin (PRAVACHOL) 20 MG tablet TAKE 1 TABLET BY ORAL ROUTE EVERY DAY   . TRULICITY 1.5 TM/5.4YT SOPN INJECT 1.5 MG INTO THE SKIN ONCE A WEEK.   . valsartan (DIOVAN) 320 MG tablet TAKE 1 TABLET BY MOUTH EVERY DAY    No facility-administered encounter medications on file as of 01/24/2020.     Objective: Lab Results  Component Value Date   HGBA1C 8.1 (H) 12/23/2019   HGBA1C 10.5 (H) 09/19/2019   HGBA1C 8.3 (H) 07/24/2019   Lab Results  Component Value Date   MICROALBUR 80 01/22/2019   LDLCALC 66 07/24/2019   CREATININE 1.47 (H) 10/17/2019   BP Readings from Last 3 Encounters:  12/23/19 116/82  11/05/19 136/68  10/20/19 (!) 161/67     Goals Addressed    . "I don't know what the next steps are for Dylan [name] Dementia"       Spouse stated  Current Barriers:  [name] Knowledge Deficits  related to next steps for diagnosis and treatment for new diagnosis of Dementia . Chronic Disease Management support and education needs related to DMII, Essential HTN, CKDIII, Dementia with behavior changes  Nurse Case Manager Clinical Goal(s):  [name] Over the next 14 days, spouse will collaborative with the Neuro office and receive instructions regarding brain imaging ordered for determination of type of Dementia - Goal Met  . Over the next 90 days, patient will verbalize understanding of plan for treatment management for new dx: Dementia  Goal Met . 01/16/20 New Over the next 14 days, patient will complete a follow up visit with Dr. Krista Blue, neurologist and spouse will report all  new or worsening symptoms related to cognitive and behavior changes . 01/16/20 New Over the next 30 days, patient and spouse will verbalize having a good understanding of the treatment plan recommended by Dr. Krista Blue and will adhere to her recommendations . 01/16/20 New Over the next 90 days, spouse will be able to provide the care needed to patient without difficulty or caregiver burnout AEB spouse will report feeling satisfied with the care she is able to provide Dylan [name] and will verbalize having all resources needed  CCM RN CM Interventions:  01/24/20: call completed with spouse Dylan [name] . Inbound call received from spouse Dylan [name] to discuss ongoing concerns about Mr. Jeff dementia  . Informed spouse Dylan [name], a message was sent to Dylan [name] with reported symptoms, advised no reply yet received . Reviewed and discussed Pharm D recommendations for Namenda regarding d/c of IR tablet and transition to ER tablet and or starting Exelon patch . Discussed and reviewed his  next scheduled Neuro follow up with Dr. Krista Blue is scheduled for 01/29/20 @ 7:30 AM . Reinforced importance of spouse reporting all new symptoms to Dr. Krista Blue; suggested Dylan [name] recording his symptoms in a journal including any pertinent details for Dr. Krista Blue to review at next visit . Discussed plans with Dylan [name] for ongoing care management follow up and confirmed she has the direct contact information for care management team 01/27/20 Placed outbound call to [facility], spoke with Dylan Hum RN  . Infomred Dylan Look RN regarding reported symptoms by spouse  . Forwarded in [employer] previously sent to [name] NP, to Dylan Hum RN . Discussed embedded Pharm D recommendations for dosage change of Namenda, transitioning to ER 24 hr Namenda and or switching to Exelon patch . Discussed Dylan [name] will provide Dr. Krista Blue and Dylan [name] an update regarding spouse reported symtpoms and recommendations for change with Namenda  06/10/19  Reviewed brain CT image results from 06/07/19 as ordered by Neurologist, Dr. Krista Blue  Noted the following IMPRESSION:  This CT scan of the head without contrast shows the following: 1.   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 2.   Mild chronic microvascular ischemic changes. 3.   There are no acute findings.  Patient Self Care Activities with assistance/supervision from spouse . Self administers medications as prescribed . Attends all scheduled provider appointments . Performs ADL's independently . Performs IADL's independently  Please see past updates related to this goal by clicking on the "Past Updates" button in the selected goal         Plan:   Telephone follow up appointment with care management team member scheduled for: 01/30/20   [facility]

## 2020-01-28 ENCOUNTER — Telehealth: Payer: Medicare HMO

## 2020-01-29 ENCOUNTER — Other Ambulatory Visit: Payer: Self-pay

## 2020-01-29 ENCOUNTER — Ambulatory Visit: Payer: Medicare HMO | Admitting: Neurology

## 2020-01-29 ENCOUNTER — Encounter: Payer: Self-pay | Admitting: Neurology

## 2020-01-29 VITALS — BP 142/68 | HR 70 | Ht 68.0 in | Wt 189.0 lb

## 2020-01-29 DIAGNOSIS — G20C Parkinsonism, unspecified: Secondary | ICD-10-CM | POA: Insufficient documentation

## 2020-01-29 DIAGNOSIS — G20 Parkinson's disease: Secondary | ICD-10-CM | POA: Diagnosis not present

## 2020-01-29 DIAGNOSIS — F0391 Unspecified dementia with behavioral disturbance: Secondary | ICD-10-CM | POA: Diagnosis not present

## 2020-01-29 MED ORDER — CARBIDOPA-LEVODOPA 25-100 MG PO TABS: 1.0000 | ORAL_TABLET | Freq: Three times a day (TID) | ORAL | 11 refills | Status: DC

## 2020-01-29 MED ORDER — RIVASTIGMINE 4.6 MG/24HR TD PT24: 4.6000 mg | MEDICATED_PATCH | Freq: Every day | TRANSDERMAL | 12 refills | Status: DC
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
## 2020-01-30 ENCOUNTER — Telehealth: Payer: Medicare HMO

## 2020-01-30 ENCOUNTER — Ambulatory Visit (INDEPENDENT_AMBULATORY_CARE_PROVIDER_SITE_OTHER): Payer: Medicare HMO

## 2020-01-30 DIAGNOSIS — I10 Essential (primary) hypertension: Secondary | ICD-10-CM | POA: Diagnosis not present

## 2020-01-30 DIAGNOSIS — N1831 Chronic kidney disease, stage 3a: Secondary | ICD-10-CM

## 2020-01-30 DIAGNOSIS — F0391 Unspecified dementia with behavioral disturbance: Secondary | ICD-10-CM | POA: Diagnosis not present

## 2020-01-30 DIAGNOSIS — E1121 Type 2 diabetes mellitus with diabetic nephropathy: Secondary | ICD-10-CM

## 2020-01-30 DIAGNOSIS — N183 Chronic kidney disease, stage 3 unspecified: Secondary | ICD-10-CM | POA: Diagnosis not present

## 2020-02-03 NOTE — Chronic Care Management (AMB) (Signed)
Chronic Care Management   Follow Up Note   01/30/2020 Name: Dylan [name]. MRN: [id number] DOB: 03-07-1941  Referred by: Dylan Chard, MD Reason for referral : Chronic Care Management (FU Inbound Call from spouse-dementia)   Dylan Ina. is a 79 y.o. year old male who is a primary care patient of Dylan Chard, MD. The CCM team was consulted for assistance with chronic disease management and care coordination needs.    Review of patient status, including review of consultants reports, relevant laboratory and other test results, and collaboration with appropriate care team members and the patient's provider was performed as part of comprehensive patient evaluation and provision of chronic care management services.    SDOH (Social Determinants of Health) assessments performed: Yes Spouse requesting resources for adult daycare's and private duty nursing services See Care Plan activities for detailed interventions related to [facility])   Inbound call received from spouse Dylan [name] for an update on Dylan [name] f/u with Neurology.     Outpatient Encounter Medications as of 01/30/2020  Medication Sig Note  . allopurinol (ZYLOPRIM) 100 MG tablet TAKE 1 TABLET BY MOUTH EVERY DAY   . amLODipine (NORVASC) 5 MG tablet TAKE 1 TABLET BY MOUTH DAILY   . Ascorbic Acid (VITAMIN C) 1000 MG tablet Take 1,000 mg by mouth daily.   [name] aspirin 81 MG tablet Take 81 mg by mouth 3 (three) times a week.    . carbidopa-levodopa (SINEMET IR) 25-100 MG tablet Take 1 tablet by mouth 3 (three) times daily.   . carvedilol (COREG) 25 MG tablet Take 1 tablet (25 mg total) by mouth 2 (two) times daily.   . Cholecalciferol (VITAMIN D3) 5000 units CAPS Take 5,000 Units by mouth every evening.   . Coenzyme Q10 200 MG capsule Take 200 mg by mouth daily.   [name] COLCRYS 0.6 MG tablet Take 1 tablet (0.6 mg total) by mouth daily.   . diclofenac Sodium (VOLTAREN) 1 % GEL Apply 2 g topically 3 (three) times daily.   [name] glucose  blood (ONETOUCH VERIO) test strip Use as instructed to check blood sugars daily dx: e11.22   . Lancet Devices (ONE TOUCH DELICA LANCING DEV) MISC Please fill ONE TOUCH DELICA LANCING DEVICE.  Use to test blood sugar twice daily as directed. E11.65   . Lancets MISC Please fill basic/generic push button lancets.  Patient to test blood sugar twice daily. DX: E11.65   . Melatonin 10 MG TABS Take 10 mg by mouth at bedtime.   . memantine (NAMENDA) 10 MG tablet Take 1 tablet (10 mg total) by mouth 2 (two) times daily. 12/05/2019: Patient is only taking 1 tablet in the am but is holding the pm dose due to having headaches  . metFORMIN (GLUCOPHAGE) 1000 MG tablet TAKE 1 TABLET BY MOUTH EVERY DAY   . pravastatin (PRAVACHOL) 20 MG tablet TAKE 1 TABLET BY ORAL ROUTE EVERY DAY   . rivastigmine (EXELON) 4.6 mg/24hr Place 1 patch (4.6 mg total) onto the skin daily.   . TRULICITY 1.5 MB/5.5HR SOPN INJECT 1.5 MG INTO THE SKIN ONCE A WEEK.   . valsartan (DIOVAN) 320 MG tablet TAKE 1 TABLET BY MOUTH EVERY DAY    No facility-administered encounter medications on file as of 01/30/2020.     Objective:  Lab Results  Component Value Date   HGBA1C 8.1 (H) 12/23/2019   HGBA1C 10.5 (H) 09/19/2019   HGBA1C 8.3 (H) 07/24/2019   Lab Results  Component Value Date   MICROALBUR 80  01/22/2019   LDLCALC 66 07/24/2019   CREATININE 1.47 (H) 10/17/2019   BP Readings from Last 3 Encounters:  01/29/20 (!) 142/68  12/23/19 116/82  11/05/19 136/68    Goals Addressed    . "I don't know what the next steps are for Dylan [name] Dementia"       Spouse stated  Current Barriers:  [name] Knowledge Deficits related to next steps for diagnosis and treatment for new diagnosis of Dementia . Chronic Disease Management support and education needs related to DMII, Essential HTN, CKDIII, Dementia with behavior changes  Nurse Case Manager Clinical Goal(s):  [name] Over the next 14 days, spouse will collaborative with the Neuro office and  receive instructions regarding brain imaging ordered for determination of type of Dementia - Goal Met  . Over the next 90 days, patient will verbalize understanding of plan for treatment management for new dx: Dementia  Goal Met . 01/16/20 New Over the next 14 days, patient will complete a follow up visit with Dr. Krista Blue, neurologist and spouse will report all new or worsening symptoms related to cognitive and behavior changes . 01/16/20 New Over the next 30 days, patient and spouse will verbalize having a good understanding of the treatment plan recommended by Dr. Krista Blue and will adhere to her recommendations . 01/16/20 New Over the next 90 days, spouse will be able to provide the care needed to patient without difficulty or caregiver burnout AEB spouse will report feeling satisfied with the care she is able to provide Dylan [name] and will verbalize having all resources needed  CCM RN CM Interventions:  01/30/20: call completed with spouse Dylan [name] . Inbound call received from spouse Dylan [name] to discuss ongoing concerns about Dylan [name] dementia and recent follow up with Neurology . Reviewed and discussed Dr. Rhea Belton recommendations to start Dylan [name] a trial dose of Carbidopa/Levodopa and to add an Exelon patch in addition to taking IR Namenda 10 mg bid . Reviewed newly prescribed medications and educated on the indication, dosage, frequency and importance of giving medications exactly as prescribed w/o missed doses . Discussed and reviewed Dr. Krista Blue noted Dylan [name] is displaying symptoms suggestive of Parkinson's disease and or Parkinsonism . Educated spouse Dylan [name] about the differences between Parkinsonism and Parkinson's disease, including, basic disease process, symptoms, evaluation and treatment recommendations; educated on PD being a neurologically progressive condition  . Sent in basket message to [place] with request to contact spouse Dylan [name] to provide resources for adult day cares  and private duty nursing per spouse's request . Discussed plans with Dylan [name] for ongoing care management follow up and confirmed she has the direct contact information for care management team . Mailed printed educational materials for spouse/patient to review; What is Parkinsonism?   Patient Self Care Activities  . Attends all scheduled provider appointments . Unable to perform ADL's independently . Unable to perform IADL's independently  Please see past updates related to this goal by clicking on the "Past Updates" button in the selected goal      . "I would like some resources for adult daycare's in the area and private duty nursing services"       [employer] (see longtitudinal plan of care for additional care plan information)  Current Barriers:  [name] Knowledge Deficits related to education and resource information on locating an adult daycare and or reliable trustworthy private duty nursing services . Chronic Disease Management support and education needs related to Type II DM, CKD stage III,  Essential HTN  Nurse Case Manager Clinical Goal(s):  [name] Over the next 30 days, patient will work with embedded BSW Dylan [name] to address needs related to resources for assistance with patient care such as adult daycare and private duty nursing   CCM RN CM Interventions:  01/30/20 call completed with spouse Dylan [name]  . Determined spouse would like resources to help her identify adult day cares in the area as well as private duty nursing services that are reliable and trustworthy  . Collaborated with embedded [place] regarding resources requested by spouse . Discussed plans with patient for ongoing care management follow up and provided patient with direct contact information for care management team  Patient Self Care Activities:  . Calls provider office for new concerns or questions . Unable to self administer medications as prescribed . Lacks social connections . Unable to  perform ADLs independently . Unable to perform IADLs independently  Initial goal documentation     . COMPLETED: I would like to continue to optimize his medication management of my chronic conditions.       Spouse stated Current Barriers:  . Polypharmacy; complex patient with multiple comorbidities including dementia, diabetes, hypertension . Wife assists with managing medications via [facility] pill packaging system.  Patient continues to take pills out and sometimes does not take them.  Pharmacist Clinical Goal(s):  [name] Over the next 90 days, patient will work with PharmD and provider towards optimized medication management  Interventions: . Comprehensive medication review performed; medication list updated in electronic medical record . Counseled wife on working to oversee pill pack administration as patient takes pills out at times and forgets to take them.  She continues to give patient the Namenda twice daily.  She has not seen a great benefit from this. . Reviewed & discussed the following diabetes-related information with patient: o Continue checking blood sugars as directed (twice daily if able) o Follow ADA recommended "diabetes-friendly" diet  (reviewed healthy snack/food options); Patient continues to eat desserts.  Wife states she is trying to limit, however patient is headstrong at times. Discussed with her the likelihood of having to add more antidiabetes medications. - New one touch verio flex meter is not working well due to patient being unable to use lancing device - Will call in basic lancets--new lancing device is not working for patient/wife . [facility] [place] [facility] not [facility] and couldn't order).  These lancets and the "Safety push button" lancets that are single use disposable. . $8 for 23-monthsupply cash . Demonstrated how to use. Patient needed refresher on how to use lancets o Confirmed current DM regimen: Metformin once daily,  Trulicity weekly (actos d/c'd due to edema/CKD) o Discussed GLP-1 injection technique.  Patient is self-administering per wife. o Reviewed medication purpose/side effects-->wife denies adverse events, denies hypoglycemia o Most recent A1c is 10.5% on 09/29/19 (continues to increase: 8.2% on 6/23, 7.5% on 3/23.  Wife working to give medications as prescribed, but receives resistance from patient as he is battling with dementia.  They are using compliance packaging by [facility] mail order.  This method appears to be the best method of organization/compliance. o Current anti-hypertensive regimen: carvedilol, amlodipine, valsartan o Current anti-hyperlipidemia regimen: pravastatin 239mdaily o Continue taking all medications as prescribed by provider o 12/04/19: Patient is only taking memantine once daily vs twice daily due to headaches.  He states when he takes it twice daily, he has a headache.  When he omits night time memantine dose, he does not have headache - 01/22/20-Message sent to CCM RNCM to assist with namenda IR to XR conversion--When switching from the immediate-release (IR) tablets to the extended-release (ER) capsules, the following conversion is recommended: 10 mg twice daily of the IR tablets should be converted to 28 mg once daily of the ER capsules beginning the day after the last dose of the IR tablet. Patients with severe renal impairment receiving 5 mg twice daily of the IR tablets may be converted to the ER capsules at a dose of 14 mg once daily beginning the day after the last dose of the IR  - Patient sees neuro, therefore neuro to adjust RX  CCM RN CM Interventions:  10/08/19 completed call with spouse Dylan [name]  Inbound call received from spouse Dylan [name] . Evaluation of current treatment plan related to Diabetes and patient's adherence to plan as established by provider. . Advised patient to stop by the office to pick up his new glucometer; discussed the [facility] nurse will work  with Mr. Lamb and Dylan [name] on how to properly use the glucometer; discussed the embedded Pharm D Lottie Dawson is also available if needed and can scheduled a face to face OV if further instruction is needed . Discussed plans with patient for ongoing care management follow up and provided patient with direct contact information for care management team . Advised patient, providing education and rationale, to check cbg daily before meals and record, calling the CCM team and or PCP for findings outside established parameters.   . Discussed Mr. Knee is riding his exercise bike daily in the evenings and is feeling better overall . Discussed Dylan [name] is waking him earlier to ensure he is checking his CBG, eating breakfast and taking his am medications . Discussed meal preparation and dinner time is now earlier in the evening - educated Dylan [name] on the importance of closely monitoring Mr. Lucken CBG's at home due to adding exercise to his daily routine can lower his CBG's - educated Dylan [name] on measures to take for hypoglycemia  . Mailed printed signs/symptoms hypo/hyperglycemia reference sheet  Patient Self Care Activities:  . Patient will take medications as prescribed with help from wife and pill packaging system . Patient will focus on improved adherence by allowing wife to assist with pill pack administration  Please see past updates related to this goal by clicking on the "Past Updates" button in the selected goal         Patient verbalizes understanding of instructions provided today.   The care management team will reach out to the patient again over the next 14-21 days.    [name], RN, BSN, CCM Care Management Coordinator [facility]  Direct Phone: [phone]

## 2020-02-03 NOTE — Patient Instructions (Signed)
Visit Information  Goals Addressed    . "I don't know what the next steps are for Mr. Cannata Dementia"       Spouse stated  Current Barriers:  [name] Knowledge Deficits related to next steps for diagnosis and treatment for new diagnosis of Dementia . Chronic Disease Management support and education needs related to DMII, Essential HTN, CKDIII, Dementia with behavior changes  Nurse Case Manager Clinical Goal(s):  [name] Over the next 14 days, spouse will collaborative with the Neuro office and receive instructions regarding brain imaging ordered for determination of type of Dementia - Goal Met  . Over the next 90 days, patient will verbalize understanding of plan for treatment management for new dx: Dementia  Goal Met . 01/16/20 New Over the next 14 days, patient will complete a follow up visit with Dr. Krista Blue, neurologist and spouse will report all new or worsening symptoms related to cognitive and behavior changes . 01/16/20 New Over the next 30 days, patient and spouse will verbalize having a good understanding of the treatment plan recommended by Dr. Krista Blue and will adhere to her recommendations . 01/16/20 New Over the next 90 days, spouse will be able to provide the care needed to patient without difficulty or caregiver burnout AEB spouse will report feeling satisfied with the care she is able to provide Mr. Brockbank and will verbalize having all resources needed  CCM RN CM Interventions:  01/30/20: call completed with spouse [name] . Inbound call received from spouse [name] to discuss ongoing concerns about Mr. Levario dementia and recent follow up with Neurology . Reviewed and discussed Dr. Rhea Belton recommendations to start Mr. Schlabach a trial dose of Carbidopa/Levodopa and to add an Exelon patch in addition to taking IR Namenda 10 mg bid . Reviewed newly prescribed medications and educated on the indication, dosage, frequency and importance of giving medications exactly as prescribed w/o missed  doses . Discussed and reviewed Dr. Krista Blue noted Mr. Magner is displaying symptoms suggestive of Parkinson's disease and or Parkinsonism . Educated spouse [name] about the differences between Parkinsonism and Parkinson's disease, including, basic disease process, symptoms, evaluation and treatment recommendations; educated on PD being a neurologically progressive condition  . Sent in basket message to [place] with request to contact spouse [name] to provide resources for adult day cares and private duty nursing per spouse's request . Discussed plans with [name] for ongoing care management follow up and confirmed she has the direct contact information for care management team . Mailed printed educational materials for spouse/patient to review; What is Parkinsonism?   Patient Self Care Activities  . Attends all scheduled provider appointments . Unable to perform ADL's independently . Unable to perform IADL's independently  Please see past updates related to this goal by clicking on the "Past Updates" button in the selected goal      . "I would like some resources for adult daycare's in the area and private duty nursing services"       [place] (see longtitudinal plan of care for additional care plan information)  Current Barriers:  [name] Knowledge Deficits related to education and resource information on locating an adult daycare and or reliable trustworthy private duty nursing services . Chronic Disease Management support and education needs related to Type II DM, CKD stage III, Essential HTN  Nurse Case Manager Clinical Goal(s):  [name] Over the next 30 days, patient will work with embedded BSW Daneen Schick to address needs related to resources for assistance  with patient care such as adult daycare and private duty nursing   CCM RN CM Interventions:  01/30/20 call completed with spouse [name]  . Determined spouse would like resources to help her identify adult day cares in the  area as well as private duty nursing services that are reliable and trustworthy  . Collaborated with embedded [place] regarding resources requested by spouse . Discussed plans with patient for ongoing care management follow up and provided patient with direct contact information for care management team  Patient Self Care Activities:  . Calls provider office for new concerns or questions . Unable to self administer medications as prescribed . Lacks social connections . Unable to perform ADLs independently . Unable to perform IADLs independently  Initial goal documentation     . COMPLETED: I would like to continue to optimize his medication management of my chronic conditions.       Spouse stated Current Barriers:  . Polypharmacy; complex patient with multiple comorbidities including dementia, diabetes, hypertension . Wife assists with managing medications via [employer] pill packaging system.  Patient continues to take pills out and sometimes does not take them.  Pharmacist Clinical Goal(s):  [name] Over the next 90 days, patient will work with PharmD and provider towards optimized medication management  Interventions: . Comprehensive medication review performed; medication list updated in electronic medical record . Counseled wife on working to oversee pill pack administration as patient takes pills out at times and forgets to take them.  She continues to give patient the Namenda twice daily.  She has not seen a great benefit from this. . Reviewed & discussed the following diabetes-related information with patient: o Continue checking blood sugars as directed (twice daily if able) o Follow ADA recommended "diabetes-friendly" diet  (reviewed healthy snack/food options); Patient continues to eat desserts.  Wife states she is trying to limit, however patient is headstrong at times. Discussed with her the likelihood of having to add more antidiabetes medications. - New one touch verio flex  meter is not working well due to patient being unable to use lancing device - Will call in basic lancets--new lancing device is not working for patient/wife . [facility] [place] [facility] not have in stock and couldn't order).  These lancets and the "Safety push button" lancets that are single use disposable. . $8 for 14-monthsupply cash . Demonstrated how to use. Patient needed refresher on how to use lancets o Confirmed current DM regimen: Metformin once daily, Trulicity weekly (actos d/c'd due to edema/CKD) o Discussed GLP-1 injection technique.  Patient is self-administering per wife. o Reviewed medication purpose/side effects-->wife denies adverse events, denies hypoglycemia o Most recent A1c is 10.5% on 09/29/19 (continues to increase: 8.2% on 6/23, 7.5% on 3/23.  Wife working to give medications as prescribed, but receives resistance from patient as he is battling with dementia.  They are using compliance packaging by [facility] mail order.  This method appears to be the best method of organization/compliance. o Current anti-hypertensive regimen: carvedilol, amlodipine, valsartan o Current anti-hyperlipidemia regimen: pravastatin 266mdaily o Continue taking all medications as prescribed by provider o 12/04/19: Patient is only taking memantine once daily vs twice daily due to headaches.  He states when he takes it twice daily, he has a headache.  When he omits night time memantine dose, he does not have headache - 01/22/20-Message sent to CCM RNCM to assist with namenda IR to XR conversion--When switching from the immediate-release (  IR) tablets to the extended-release (ER) capsules, the following conversion is recommended: 10 mg twice daily of the IR tablets should be converted to 28 mg once daily of the ER capsules beginning the day after the last dose of the IR tablet. Patients with severe renal impairment receiving 5 mg twice daily of the IR tablets may be converted  to the ER capsules at a dose of 14 mg once daily beginning the day after the last dose of the IR  - Patient sees neuro, therefore neuro to adjust RX  CCM RN CM Interventions:  10/08/19 completed call with spouse [name]  Inbound call received from spouse [name] . Evaluation of current treatment plan related to Diabetes and patient's adherence to plan as established by provider. . Advised patient to stop by the office to pick up his new glucometer; discussed the [facility] nurse will work with Mr. Beeney and [name] on how to properly use the glucometer; discussed the embedded Pharm D Lottie Dawson is also available if needed and can scheduled a face to face OV if further instruction is needed . Discussed plans with patient for ongoing care management follow up and provided patient with direct contact information for care management team . Advised patient, providing education and rationale, to check cbg daily before meals and record, calling the CCM team and or PCP for findings outside established parameters.   . Discussed Mr. Woolbright is riding his exercise bike daily in the evenings and is feeling better overall . Discussed [name] is waking him earlier to ensure he is checking his CBG, eating breakfast and taking his am medications . Discussed meal preparation and dinner time is now earlier in the evening - educated [name] on the importance of closely monitoring Mr. Rickey CBG's at home due to adding exercise to his daily routine can lower his CBG's - educated [name] on measures to take for hypoglycemia  . Mailed printed signs/symptoms hypo/hyperglycemia reference sheet  Patient Self Care Activities:  . Patient will take medications as prescribed with help from wife and pill packaging system . Patient will focus on improved adherence by allowing wife to assist with pill pack administration  Please see past updates related to this goal by clicking on the "Past Updates" button  in the selected goal         Patient verbalizes understanding of instructions provided today.   The care management team will reach out to the patient again over the next 14-21 days.    [name], RN, BSN, CCM Care Management Coordinator [facility]  Direct Phone: [phone]

## 2020-02-07 ENCOUNTER — Ambulatory Visit: Payer: Medicare HMO

## 2020-02-07 DIAGNOSIS — F0391 Unspecified dementia with behavioral disturbance: Secondary | ICD-10-CM

## 2020-02-07 NOTE — Patient Instructions (Signed)
Social Worker Visit Information  Goals we discussed today:  Goals Addressed            This Visit's Progress   . "I would like some resources for adult daycare's in the area and private duty nursing services"       [place] (see longtitudinal plan of care for additional care plan information)  Current Barriers:  [name] Knowledge Deficits related to education and resource information on locating an adult daycare and or reliable trustworthy private duty nursing services . Chronic Disease Management support and education needs related to Type II DM, CKD stage III, Essential HTN  Nurse Case Manager Clinical Goal(s):  [name] Over the next 30 days, patient will work with embedded BSW Dylan [name] to address needs related to resources for assistance with patient care such as adult daycare and private duty nursing   CCM SW Interventions Completed 02/07/20 with Dylan [name] . Successful outbound call to Dylan [name] to assist with resource education related to patient care . Discussed opportunity to enroll in an adult day program  o Informed by Dylan [name], the patient is not interested in or willing to attend an adult day program . Determined Dylan [name] is interested in respite placement "next year" for about 7 days when [place] plans to take her grand-daughter on a trip post graduation o Dylan [name] a respite stay would be offerred through assisted livings and would be sought when closer to date of placement needs o Discussed concerns of Dylan [name] wanting to know pricing now so she may save up funds - Dylan [name] reports she has contacted several ALF in [place] for pricing but wanted SW help regarding which place is "good" - Dylan [name], SW was unable to help plan a respite stay over a year in advance . Reviewed concern of patient being left alone when Dylan [name] goes out of town for weekend trips o Discussed opportunity to enroll patient in "Caregiver on call" program offered through [employer] o Provided  information via mail . Determined the patient has experienced a fall this week but without reported injury o Collaboration with RN Care Manager to inform of recent fall . Discussed importance of keeping patient active and engaged both physically and cognitively o Provided education on local programs offered by [facility]  o Discussed opportunity to attend "Connections" group respite program offered by [employer] o Mailed information to the patient and Dylan [name] for review . Scheduled follow up call over the next month to confirm receipt of mailing and assist with care coordination needs  CCM RN CM Interventions:  01/30/20 call completed with spouse Dylan [name]  . Determined spouse would like resources to help her identify adult day cares in the area as well as private duty nursing services that are reliable and trustworthy  . Collaborated with embedded [place] regarding resources requested by spouse . Discussed plans with patient for ongoing care management follow up and provided patient with direct contact information for care management team  Patient Self Care Activities:  . Calls provider office for new concerns or questions . Unable to self administer medications as prescribed . Lacks social connections . Unable to perform ADLs independently . Unable to perform IADLs independently  Please see past updates related to this goal by clicking on the "Past Updates" button in the selected goal          Materials Provided: Yes: provided resource education via mail  Follow Up Plan: SW will follow up with patient by phone  over the next month.   Dylan [name], BSW, CDP Social Worker, Certified Dementia Practitioner [place] [facility] [phone]

## 2020-02-07 NOTE — Chronic Care Management (AMB) (Signed)
Chronic Care Management    Social Work Follow Up Note  02/07/2020 Name: Dylan [name]. MRN: [id number] DOB: 17-Nov-1940  Dylan [name]. is a 79 y.o. year old male who is a primary care patient of Dylan Chard, MD. The CCM team was consulted for assistance with care coordination.   Review of patient status, including review of consultants reports, other relevant assessments, and collaboration with appropriate care team members and the patient's provider was performed as part of comprehensive patient evaluation and provision of chronic care management services.    SDOH (Social Determinants of Health) assessments performed: No    Outpatient Encounter Medications as of 02/07/2020  Medication Sig Note  . allopurinol (ZYLOPRIM) 100 MG tablet TAKE 1 TABLET BY MOUTH EVERY DAY   . amLODipine (NORVASC) 5 MG tablet TAKE 1 TABLET BY MOUTH DAILY   . Ascorbic Acid (VITAMIN C) 1000 MG tablet Take 1,000 mg by mouth daily.   [name] aspirin 81 MG tablet Take 81 mg by mouth 3 (three) times a week.    . carbidopa-levodopa (SINEMET IR) 25-100 MG tablet Take 1 tablet by mouth 3 (three) times daily.   . carvedilol (COREG) 25 MG tablet Take 1 tablet (25 mg total) by mouth 2 (two) times daily.   . Cholecalciferol (VITAMIN D3) 5000 units CAPS Take 5,000 Units by mouth every evening.   . Coenzyme Q10 200 MG capsule Take 200 mg by mouth daily.   [name] COLCRYS 0.6 MG tablet Take 1 tablet (0.6 mg total) by mouth daily.   . diclofenac Sodium (VOLTAREN) 1 % GEL Apply 2 g topically 3 (three) times daily.   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 Lancet Devices (ONE TOUCH DELICA LANCING DEV) MISC Please fill ONE TOUCH DELICA LANCING DEVICE.  Use to test blood sugar twice daily as directed. E11.65   . Lancets MISC Please fill basic/generic push button lancets.  Patient to test blood sugar twice daily. DX: E11.65   . Melatonin 10 MG TABS Take 10 mg by mouth at  bedtime.   . memantine (NAMENDA) 10 MG tablet Take 1 tablet (10 mg total) by mouth 2 (two) times daily. 12/05/2019: Patient is only taking 1 tablet in the am but is holding the pm dose due to having headaches  . metFORMIN (GLUCOPHAGE) 1000 MG tablet TAKE 1 TABLET BY MOUTH EVERY DAY   . pravastatin (PRAVACHOL) 20 MG tablet TAKE 1 TABLET BY ORAL ROUTE EVERY DAY   . rivastigmine (EXELON) 4.6 mg/24hr Place 1 patch (4.6 mg total) onto the skin daily.   . TRULICITY 1.5 RC/7.8LF SOPN INJECT 1.5 MG INTO THE SKIN ONCE A WEEK.   . valsartan (DIOVAN) 320 MG tablet TAKE 1 TABLET BY MOUTH EVERY DAY    No facility-administered encounter medications on file as of 02/07/2020.     Goals Addressed            This Visit's Progress   . "I would like some resources for adult daycare's in the area and private duty nursing services"       [place] (see longtitudinal plan of care for additional care plan information)  Current Barriers:  [name] Knowledge Deficits related to education and resource information on locating an adult daycare and or reliable trustworthy private duty nursing services . Chronic Disease Management support and education needs related to Type II DM, CKD stage III, Essential HTN  Nurse Case Manager Clinical  Goal(s):  [name] Over the next 30 days, patient will work with embedded BSW Daneen Schick to address needs related to resources for assistance with patient care such as adult daycare and private duty nursing   CCM SW Interventions Completed 02/07/20 with Dylan [name] . Successful outbound call to Dylan [name] to assist with resource education related to patient care . Discussed opportunity to enroll in an adult day program  o Informed by Dylan [name], the patient is not interested in or willing to attend an adult day program . Determined Dylan [name] is interested in respite placement "next year" for about 7 days when [place] plans to take her grand-daughter on a trip post graduation o [name] a respite stay  would be offerred through assisted livings and would be sought when closer to date of placement needs o Discussed concerns of Dylan [name] wanting to know pricing now so she may save up funds - Dylan [name] reports she has contacted several ALF in [place] for pricing but wanted SW help regarding which place is "good" - [name], SW was unable to help plan a respite stay over a year in advance . Reviewed concern of patient being left alone when Dylan [name] goes out of town for weekend trips o Discussed opportunity to enroll patient in "Caregiver on call" program offered through [employer] o Provided information via mail . Determined the patient has experienced a fall this week but without reported injury o Collaboration with RN Care Manager to inform of recent fall . Discussed importance of keeping patient active and engaged both physically and cognitively o Provided education on local programs offered by [facility]  o Discussed opportunity to attend "Connections" group respite program offered by [employer] o Mailed information to the patient and Dylan [name] for review . Scheduled follow up call over the next month to confirm receipt of mailing and assist with care coordination needs  CCM RN CM Interventions:  01/30/20 call completed with spouse Dylan [name]  . Determined spouse would like resources to help her identify adult day cares in the area as well as private duty nursing services that are reliable and trustworthy  . Collaborated with embedded [place] regarding resources requested by spouse . Discussed plans with patient for ongoing care management follow up and provided patient with direct contact information for care management team  Patient Self Care Activities:  . Calls provider office for new concerns or questions . Unable to self administer medications as prescribed . Lacks social connections . Unable to perform ADLs independently . Unable to perform  IADLs independently  Please see past updates related to this goal by clicking on the "Past Updates" button in the selected goal          Follow Up Plan: SW will follow up with patient by phone over the next month   Daneen Schick, BSW, CDP Social Worker, Certified Dementia Practitioner [facility] [phone]  Total time spent performing care coordination and/or care management activities with the patient by phone or face to face = 27 minutes.

## 2020-02-14 ENCOUNTER — Telehealth: Payer: Medicare HMO

## 2020-02-18 ENCOUNTER — Ambulatory Visit: Payer: Medicare HMO | Admitting: Podiatry

## 2020-02-18 ENCOUNTER — Other Ambulatory Visit: Payer: Self-pay

## 2020-02-18 DIAGNOSIS — M79675 Pain in left toe(s): Secondary | ICD-10-CM

## 2020-02-18 DIAGNOSIS — E1151 Type 2 diabetes mellitus with diabetic peripheral angiopathy without gangrene: Secondary | ICD-10-CM | POA: Diagnosis not present

## 2020-02-18 DIAGNOSIS — B351 Tinea unguium: Secondary | ICD-10-CM

## 2020-02-18 DIAGNOSIS — M79674 Pain in right toe(s): Secondary | ICD-10-CM | POA: Diagnosis not present

## 2020-02-18 NOTE — Patient Instructions (Signed)
Diabetes Mellitus and Foot Care Foot care is an important part of your health, especially when you have diabetes. Diabetes may cause you to have problems because of poor blood flow (circulation) to your feet and legs, which can cause your skin to:  Become thinner and drier.  Break more easily.  Heal more slowly.  Peel and crack. You may also have nerve damage (neuropathy) in your legs and feet, causing decreased feeling in them. This means that you may not notice minor injuries to your feet that could lead to more serious problems. Noticing and addressing any potential problems early is the best way to prevent future foot problems. How to care for your feet Foot hygiene  Wash your feet daily with warm water and mild soap. Do not use hot water. Then, pat your feet and the areas between your toes until they are completely dry. Do not soak your feet as this can dry your skin.  Trim your toenails straight across. Do not dig under them or around the cuticle. File the edges of your nails with an emery board or nail file.  Apply a moisturizing lotion or petroleum jelly to the skin on your feet and to dry, brittle toenails. Use lotion that does not contain alcohol and is unscented. Do not apply lotion between your toes. Shoes and socks  Wear clean socks or stockings every day. Make sure they are not too tight. Do not wear knee-high stockings since they may decrease blood flow to your legs.  Wear shoes that fit properly and have enough cushioning. Always look in your shoes before you put them on to be sure there are no objects inside.  To break in new shoes, wear them for just a few hours a day. This prevents injuries on your feet. Wounds, scrapes, corns, and calluses  Check your feet daily for blisters, cuts, bruises, sores, and redness. If you cannot see the bottom of your feet, use a mirror or ask someone for help.  Do not cut corns or calluses or try to remove them with medicine.  If you  find a minor scrape, cut, or break in the skin on your feet, keep it and the skin around it clean and dry. You may clean these areas with mild soap and water. Do not clean the area with peroxide, alcohol, or iodine.  If you have a wound, scrape, corn, or callus on your foot, look at it several times a day to make sure it is healing and not infected. Check for: ? Redness, swelling, or pain. ? Fluid or blood. ? Warmth. ? Pus or a bad smell. General instructions  Do not cross your legs. This may decrease blood flow to your feet.  Do not use heating pads or hot water bottles on your feet. They may burn your skin. If you have lost feeling in your feet or legs, you may not know this is happening until it is too late.  Protect your feet from hot and cold by wearing shoes, such as at the beach or on hot pavement.  Schedule a complete foot exam at least once a year (annually) or more often if you have foot problems. If you have foot problems, report any cuts, sores, or bruises to your health care provider immediately. Contact a health care provider if:  You have a medical condition that increases your risk of infection and you have any cuts, sores, or bruises on your feet.  You have an injury that is not   healing.  You have redness on your legs or feet.  You feel burning or tingling in your legs or feet.  You have pain or cramps in your legs and feet.  Your legs or feet are numb.  Your feet always feel cold.  You have pain around a toenail. Get help right away if:  You have a wound, scrape, corn, or callus on your foot and: ? You have pain, swelling, or redness that gets worse. ? You have fluid or blood coming from the wound, scrape, corn, or callus. ? Your wound, scrape, corn, or callus feels warm to the touch. ? You have pus or a bad smell coming from the wound, scrape, corn, or callus. ? You have a fever. ? You have a red line going up your leg. Summary  Check your feet every day  for cuts, sores, red spots, swelling, and blisters.  Moisturize feet and legs daily.  Wear shoes that fit properly and have enough cushioning.  If you have foot problems, report any cuts, sores, or bruises to your health care provider immediately.  Schedule a complete foot exam at least once a year (annually) or more often if you have foot problems. This information is not intended to replace advice given to you by your health care provider. Make sure you discuss any questions you have with your health care provider. Document Revised: 07/10/2019 Document Reviewed: 11/18/2016 Elsevier Patient Education  2020 [employer] Inc.  Onychomycosis/Fungal Toenails  WHAT IS IT? An infection that lies within the keratin of your nail plate that is caused by a fungus.  WHY ME? Fungal infections affect all ages, sexes, races, and creeds.  There may be many factors that predispose you to a fungal infection such as age, coexisting medical conditions such as diabetes, or an autoimmune disease; stress, medications, fatigue, genetics, etc.  Bottom line: fungus thrives in a warm, moist environment and your shoes offer such a location.  IS IT CONTAGIOUS? Theoretically, yes.  You do not want to share shoes, nail clippers or files with someone who has fungal toenails.  Walking around barefoot in the same room or sleeping in the same bed is unlikely to transfer the organism.  It is important to realize, however, that fungus can spread easily from one nail to the next on the same foot.  HOW DO WE TREAT THIS?  There are several ways to treat this condition.  Treatment may depend on many factors such as age, medications, pregnancy, liver and kidney conditions, etc.  It is best to ask your doctor which options are available to you.  5. No treatment.   Unlike many other medical concerns, you can live with this condition.  However for many people this can be a painful condition and may lead to ingrown toenails or a bacterial  infection.  It is recommended that you keep the nails cut short to help reduce the amount of fungal nail. 6. Topical treatment.  These range from herbal remedies to prescription strength nail lacquers.  About 40-50% effective, topicals require twice daily application for approximately 9 to 12 months or until an entirely new nail has grown out.  The most effective topicals are medical grade medications available through physicians offices. 7. Oral antifungal medications.  With an 80-90% cure rate, the most common oral medication requires 3 to 4 months of therapy and stays in your system for a year as the new nail grows out.  Oral antifungal medications do require blood work to make   sure it is a safe drug for you.  A liver function panel will be performed prior to starting the medication and after the first month of treatment.  It is important to have the blood work performed to avoid any harmful side effects.  In general, this medication safe but blood work is required. 8. Laser Therapy.  This treatment is performed by applying a specialized laser to the affected nail plate.  This therapy is noninvasive, fast, and non-painful.  It is not covered by insurance and is therefore, out of pocket.  The results have been very good with a 80-95% cure rate.  The [facility] is the only practice in the area to offer this therapy. 9. Permanent Nail Avulsion.  Removing the entire nail so that a new nail will not grow back. 

## 2020-02-20 ENCOUNTER — Other Ambulatory Visit: Payer: Self-pay

## 2020-02-20 ENCOUNTER — Ambulatory Visit: Payer: Self-pay

## 2020-02-20 ENCOUNTER — Telehealth: Payer: Medicare HMO

## 2020-02-20 ENCOUNTER — Encounter: Payer: Self-pay | Admitting: Podiatry

## 2020-02-20 DIAGNOSIS — I10 Essential (primary) hypertension: Secondary | ICD-10-CM | POA: Diagnosis not present

## 2020-02-20 DIAGNOSIS — N183 Chronic kidney disease, stage 3 unspecified: Secondary | ICD-10-CM

## 2020-02-20 DIAGNOSIS — F0391 Unspecified dementia with behavioral disturbance: Secondary | ICD-10-CM | POA: Diagnosis not present

## 2020-02-20 DIAGNOSIS — N1831 Chronic kidney disease, stage 3a: Secondary | ICD-10-CM | POA: Diagnosis not present

## 2020-02-20 NOTE — Progress Notes (Signed)
Subjective: Dylan [name]. presents today for follow up of at risk foot care. Pt has h/o NIDDM with chronic kidney disease and painful mycotic nails b/l that are difficult to trim. Pain interferes with ambulation. Aggravating factors include wearing enclosed shoe gear. Pain is relieved with periodic professional debridement.  Dylan [name] is present with his wife on today's visit. They voice no new pedal concerns.   No Known Allergies   Objective: There were no vitals filed for this visit.  Pt is a pleasant 78 y.o. year old AA male in NAD. AAO x 3.   Vascular Examination:  Capillary fill time to digits delayed. Faintly palpable DP pulses b/l. Faintly palpable PT pulses b/l. Pedal hair present b/l. Skin temperature gradient within normal limits b/l. Trace edema noted b/l feet.  Dermatological Examination: Pedal skin with normal turgor, texture and tone bilaterally. No open wounds bilaterally. No interdigital macerations bilaterally. Toenails 1-5 b/l elongated, dystrophic, thickened, crumbly with subungual debris and tenderness to dorsal palpation.  Musculoskeletal: Normal muscle strength 5/5 to all lower extremity muscle groups bilaterally, no gross bony deformities bilaterally and no pain crepitus or joint limitation noted with ROM b/l  Neurological: Protective sensation intact 5/5 intact bilaterally with 10g monofilament b/l. Vibratory sensation intact b/l. Babinski reflex negative b/l. Clonus negative b/l.  Assessment: 1. Pain due to onychomycosis of toenails of both feet   2. Type II diabetes mellitus with peripheral circulatory disorder (HCC)    Plan: -Continue diabetic foot care principles. Literature dispensed on today.  -Toenails 1-5 b/l were debrided in length and girth with sterile nail nippers and dremel without iatrogenic bleeding.  -Patient to continue soft, supportive shoe gear daily. -Patient to report any pedal injuries to medical professional immediately. -Patient/POA to  call should there be question/concern in the interim.  Return in about 9 weeks (around 04/21/2020) for diabetic nail trim.

## 2020-02-23 ENCOUNTER — Other Ambulatory Visit: Payer: Self-pay | Admitting: Internal Medicine

## 2020-02-25 NOTE — Chronic Care Management (AMB) (Signed)
Chronic Care Management   Follow Up Note   02/21/2020 Name: Dylan [name]. MRN: [id number] DOB: 12-Nov-1940  Referred by: [name], MD Reason for referral : Chronic Care Management (FU RN Call )   Dylan Ina. is a 79 y.o. year old male who is a primary care patient of [name], MD. The CCM team was consulted for assistance with chronic disease management and care coordination needs.    Review of patient status, including review of consultants reports, relevant laboratory and other test results, and collaboration with appropriate care team members and the patient's provider was performed as part of comprehensive patient evaluation and provision of chronic care management services.    SDOH (Social Determinants of Health) assessments performed: Yes - no new challenges identified during this call  See Care Plan activities for detailed interventions related to Down[place])    Placed CCM RN CM outbound follow up call to spouse Dylan [name].   Outpatient Encounter Medications as of 02/20/2020  Medication Sig Note  . Ascorbic Acid (VITAMIN C) 1000 MG tablet Take 1,000 mg by mouth daily.   [name] aspirin 81 MG tablet Take 81 mg by mouth 3 (three) times a week.    . carbidopa-levodopa (SINEMET IR) 25-100 MG tablet Take 1 tablet by mouth 3 (three) times daily.   . carvedilol (COREG) 25 MG tablet Take 1 tablet (25 mg total) by mouth 2 (two) times daily.   . Cholecalciferol (VITAMIN D3) 5000 units CAPS Take 5,000 Units by mouth every evening.   . Coenzyme Q10 200 MG capsule Take 200 mg by mouth daily.   [name] COLCRYS 0.6 MG tablet Take 1 tablet (0.6 mg total) by mouth daily.   . diclofenac Sodium (VOLTAREN) 1 % GEL Apply 2 g topically 3 (three) times daily.   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 Lancet Devices (ONE TOUCH DELICA LANCING DEV) MISC Please fill ONE TOUCH DELICA LANCING DEVICE.  Use to test blood sugar twice daily as  directed. E11.65   . Lancets MISC Please fill basic/generic push button lancets.  Patient to test blood sugar twice daily. DX: E11.65   . Melatonin 10 MG TABS Take 10 mg by mouth at bedtime.   . memantine (NAMENDA) 10 MG tablet Take 1 tablet (10 mg total) by mouth 2 (two) times daily. 12/05/2019: Patient is only taking 1 tablet in the am but is holding the pm dose due to having headaches  . metFORMIN (GLUCOPHAGE) 1000 MG tablet TAKE 1 TABLET BY MOUTH EVERY DAY   . rivastigmine (EXELON) 4.6 mg/24hr Place 1 patch (4.6 mg total) onto the skin daily.   . TRULICITY 1.5 AT/5.5DD SOPN INJECT 1.5 MG INTO THE SKIN ONCE A WEEK.   . valsartan (DIOVAN) 320 MG tablet TAKE 1 TABLET BY MOUTH EVERY DAY   . [DISCONTINUED] allopurinol (ZYLOPRIM) 100 MG tablet TAKE 1 TABLET BY MOUTH EVERY DAY   . [DISCONTINUED] amLODipine (NORVASC) 5 MG tablet TAKE 1 TABLET BY MOUTH DAILY   . [DISCONTINUED] pravastatin (PRAVACHOL) 20 MG tablet TAKE 1 TABLET BY ORAL ROUTE EVERY DAY    No facility-administered encounter medications on file as of 02/20/2020.     Objective:  Lab Results  Component Value Date   HGBA1C 8.1 (H) 12/23/2019   HGBA1C 10.5 (H) 09/19/2019   HGBA1C 8.3 (H) 07/24/2019   Lab Results  Component Value Date   MICROALBUR 80 01/22/2019   LDLCALC 66  07/24/2019   CREATININE 1.47 (H) 10/17/2019   BP Readings from Last 3 Encounters:  01/29/20 (!) 142/68  12/23/19 116/82  11/05/19 136/68    Goals Addressed      Patient Stated   . COMPLETED: "I am having right ear pain that radiates to the back of my head when I ride my exercise bike" (pt-stated)       Current Barriers:  [name] Knowledge Deficits related to evaluation and treatment of right ear and head pain   Nurse Case Manager Clinical Goal(s):  [name] Over the next 30 days, patient will work with PCP provider  to address needs related to diagnosis and treatment of right ear/head pain  CCM RN CM Interventions:  10/09/19 call completed with patient and spouse     . Evaluation of current treatment plan related to right ear pain and headache and patient's adherence to plan as established by provider. . Advised patient to arrive at the [facility] office tomorrow, 10/10/19 @ 10:45 AM for evaluation and treatment of right ear pain and headache  . Collaborated with PCP [name] regarding patient's c/o right ear pain and headache when riding his exercise bike; advised the patient reports this problem started about 2 days ago and resolves with rest or lying his head down . Discussed plans with patient for ongoing care management follow up and provided patient with direct contact information for care management team . Reviewed scheduled/upcoming provider appointments including: OV with provider Audery Amel PA-C set for 10/10/19 '@10' :45 AM   Patient Self Care Activities:   . Unable to self administer medications as prescribed . Unable to perform ADLs independently . Unable to perform IADLs independently  Initial goal documentation       Other   . 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 Chronic Disease Management support and education needs related to DMII, CKDIII, HTN, memory loss   Nurse Case Manager Clinical Goal(s):  [name] Over the next 60 days, patient will verbalize understanding of plan for following home safety recommendations, including wearing good supportive shoes even while inside his home, using his cane/walker at all times, keeping floors clutter free and hallways well lit to help avoid falls . Over the next 90 days, patient/spouse will report any/all falls to the CCM team and PCP provider  . Over the next 90 days, patient will not experience having any falls and or injury related to falls   Interventions:  . Evaluation of current treatment plan related to Impaired  Physical Mobility and patient's adherence to plan as established by provider . Determined patient experienced a fall in late December while in his home and not using his cane resulting in an ED visit; Determined patient suffered from a closed left hand fracture and rib contusion . Assessed for change in mental status and or sensory changes; Assessed for worsening impaired gait/balance . Determined patient was not wearing shoes and did not have his cane in use when he fell in his bedroom while trying to hold onto his bed for leverage . Discussed patient will be discharged from in home PT next week due to meeting all goals . Discussed spouse Dylan [name] continues to encourage him to use his DME at all times and to perform his HEP as directed and this includes using his exercise bike as tolerated .  Provided disease education regarding patient's vascular dementia and brain atrophy noted by the Neurologist to be affecting his balance and coordination; further education provided regarding patient's impaired physical mobility and high risk for falls and injury from falls  . Instructed spouse to notify [name] and or the CCM team promptly of any further falls and or injury from falls . Discussed plans with patient for ongoing care management follow up and provided patient with direct contact information for care management team . Sent in basket message to [name] with a patient update concerning patient's fall and injury occurring in late December   Patient Self Care Activities: . Unable to self administer medications as prescribed . Does not adhere to provider recommendations re: ADA diet . Unable to perform ADLs independently . Unable to perform IADLs independently  Please see past updates related to this goal by clicking on the "Past Updates" button in the selected goal     . COMPLETED: "He's getting injections in his back"       Spouse states Current Barriers:  [name] Knowledge Deficits related to  diagnosis and treatment management for lumbar pain  . Vascular dementia . Diabetes  Nurse Case Manager Clinical Goal(s):  [name] Over the next 30 days, patient/spouse will verbalize understanding of plan for treatment plan for lumbar pain . Over the next 30 days, spouse will report patient's CBG's remain to be WNL and will verbalize knowing when to call the CCM team and or PCP for abnormal readings  CCM RN CM Interventions:  08/07/19 call completed with spouse [name]   . Evaluation of current treatment plan related to lumbar spinal pain and patient's adherence to plan as established by provider. . Provided education to patient re: the importance to closely monitor patient's CBG's due to potential for Hyperglycemia while receiving steroid injections to lumbar spine . Discussed plans with patient for ongoing care management follow up and provided patient with direct contact information for care management team . Advised patient, providing education and rationale, to check cbg daily before meals and record, calling CCM team and or PCP for findings outside established parameters.  <80 and or >250  Patient Self Care Activities with assistance from spouse . Self administers medications as prescribed . Attends all scheduled provider appointments . Performs ADL's independently . Performs IADL's independently  Initial goal documentation    . "I don't know what the next steps are for Mr. Roes Dementia"       Spouse stated  Current Barriers:  [name] Knowledge Deficits related to next steps for diagnosis and treatment for new diagnosis of Dementia . Chronic Disease Management support and education needs related to DMII, Essential HTN, CKDIII, Dementia with behavior changes  Nurse Case Manager Clinical Goal(s):  [name] Over the next 14 days, spouse will collaborative with the Neuro office and receive instructions regarding brain imaging ordered for determination of type of Dementia - Goal Met   . Over the next 90 days, patient will verbalize understanding of plan for treatment management for new dx: Dementia  Goal Met . 01/16/20 New Over the next 14 days, patient will complete a follow up visit with Dr. Krista Blue, neurologist and spouse will report all new or worsening symptoms related to cognitive and behavior changes . 01/16/20 New Over the next 30 days, patient and spouse will verbalize having a good understanding of the treatment plan recommended by Dr. Krista Blue and will adhere to her recommendations . 01/16/20 New Over the next 90 days, spouse  will be able to provide the care needed to patient without difficulty or caregiver burnout AEB spouse will report feeling satisfied with the care she is able to provide Mr. Hao and will verbalize having all resources needed  CCM RN CM Interventions:  02/21/20: call completed with spouse Dylan [name] . Reviewed and discussed Dr. Rhea Belton recommendations to start Mr. Fiallos a trial dose of Carbidopa/Levodopa and to add an Exelon patch in addition to taking IR Namenda 10 mg bid . Determined spouse believes patient's symptoms of rigidity, tremors and slurred speech have improved since staring the new therapy . Reinforced the importance of patient taking this medication exactly as prescribed w/o missed or late doses for best effectiveness . Educated on potential wearing off effects and discussed this should be reported promptly if noticed  . Discussed patient's next f/u with [facility] is scheduled for 03/24/20 @ 1:15 PM to evaluate effectiveness of Carbidopa/Levodopa  . Discussed plans with [name] for ongoing care management follow up and confirmed she has the direct contact information for care management team  Patient Self Care Activities  . Attends all scheduled provider appointments . Unable to perform ADL's independently . Unable to perform IADL's independently  Please see past updates related to this goal by clicking on the "Past Updates" button in the  selected goal        Plan:   Telephone follow up appointment with care management team member scheduled for: 04/02/20  [name], RN, BSN, CCM Care Management Coordinator [place] [facility]  Direct Phone: [phone]

## 2020-02-25 NOTE — Patient Instructions (Signed)
Visit Information  Goals Addressed      Patient Stated   . COMPLETED: "I am having right ear pain that radiates to the back of my head when I ride my exercise bike" (pt-stated)       Current Barriers:  [name] Knowledge Deficits related to evaluation and treatment of right ear and head pain   Nurse Case Manager Clinical Goal(s):  [name] Over the next 30 days, patient will work with PCP provider  to address needs related to diagnosis and treatment of right ear/head pain  CCM RN CM Interventions:  10/09/19 call completed with patient and spouse   . Evaluation of current treatment plan related to right ear pain and headache and patient's adherence to plan as established by provider. . Advised patient to arrive at the [facility] office tomorrow, 10/10/19 @ 10:45 AM for evaluation and treatment of right ear pain and headache  . Collaborated with PCP [name] regarding patient's c/o right ear pain and headache when riding his exercise bike; advised the patient reports this problem started about 2 days ago and resolves with rest or lying his head down . Discussed plans with patient for ongoing care management follow up and provided patient with direct contact information for care management team . Reviewed scheduled/upcoming provider appointments including: OV with provider Audery Amel PA-C set for 10/10/19 '@10' :45 AM   Patient Self Care Activities:   . Unable to self administer medications as prescribed . Unable to perform ADLs independently . Unable to perform IADLs independently  Initial goal documentation       Other   . 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 Chronic Disease Management support and education needs related to DMII, CKDIII, HTN, memory loss   Nurse Case Manager Clinical Goal(s):  [name] Over the next 60 days,  patient will verbalize understanding of plan for following home safety recommendations, including wearing good supportive shoes even while inside his home, using his cane/walker at all times, keeping floors clutter free and hallways well lit to help avoid falls . Over the next 90 days, patient/spouse will report any/all falls to the CCM team and PCP provider  . Over the next 90 days, patient will not experience having any falls and or injury related to falls   Interventions:  . Evaluation of current treatment plan related to Impaired Physical Mobility and patient's adherence to plan as established by provider . Determined patient experienced a fall in late December while in his home and not using his cane resulting in an ED visit; Determined patient suffered from a closed left hand fracture and rib contusion . Assessed for change in mental status and or sensory changes; Assessed for worsening impaired gait/balance . Determined patient was not wearing shoes and did not have his cane in use when he fell in his bedroom while trying to hold onto his bed for leverage . Discussed patient will be discharged from in home PT next week due to meeting all goals . Discussed spouse Dylan [name] continues to encourage him to use his DME at all times and to perform his HEP as directed and this includes using his exercise bike as tolerated . Provided disease education regarding patient's vascular dementia and brain atrophy noted by the Neurologist to be affecting his balance and coordination; further education provided regarding patient's  impaired physical mobility and high risk for falls and injury from falls  . Instructed spouse to notify [name] and or the CCM team promptly of any further falls and or injury from falls . Discussed plans with patient for ongoing care management follow up and provided patient with direct contact information for care management team . Sent in basket message to [name] with a patient  update concerning patient's fall and injury occurring in late December   Patient Self Care Activities: . Unable to self administer medications as prescribed . Does not adhere to provider recommendations re: ADA diet . Unable to perform ADLs independently . Unable to perform IADLs independently  Please see past updates related to this goal by clicking on the "Past Updates" button in the selected goal      . COMPLETED: "He's getting injections in his back"       Spouse states Current Barriers:  [name] Knowledge Deficits related to diagnosis and treatment management for lumbar pain  . Vascular dementia . Diabetes  Nurse Case Manager Clinical Goal(s):  [name] Over the next 30 days, patient/spouse will verbalize understanding of plan for treatment plan for lumbar pain . Over the next 30 days, spouse will report patient's CBG's remain to be WNL and will verbalize knowing when to call the CCM team and or PCP for abnormal readings  CCM RN CM Interventions:  08/07/19 call completed with spouse [name]   . Evaluation of current treatment plan related to lumbar spinal pain and patient's adherence to plan as established by provider. . Provided education to patient re: the importance to closely monitor patient's CBG's due to potential for Hyperglycemia while receiving steroid injections to lumbar spine . Discussed plans with patient for ongoing care management follow up and provided patient with direct contact information for care management team . Advised patient, providing education and rationale, to check cbg daily before meals and record, calling CCM team and or PCP for findings outside established parameters.  <80 and or >250  Patient Self Care Activities with assistance from spouse . Self administers medications as prescribed . Attends all scheduled provider appointments . Performs ADL's independently . Performs IADL's independently   Initial goal documentation     . "I don't know what  the next steps are for Dylan [name] Dementia"       Spouse stated  Current Barriers:  [name] Knowledge Deficits related to next steps for diagnosis and treatment for new diagnosis of Dementia . Chronic Disease Management support and education needs related to DMII, Essential HTN, CKDIII, Dementia with behavior changes  Nurse Case Manager Clinical Goal(s):  [name] Over the next 14 days, spouse will collaborative with the Neuro office and receive instructions regarding brain imaging ordered for determination of type of Dementia - Goal Met  . Over the next 90 days, patient will verbalize understanding of plan for treatment management for new dx: Dementia  Goal Met . 01/16/20 New Over the next 14 days, patient will complete a follow up visit with Dr. Krista Blue, neurologist and spouse will report all new or worsening symptoms related to cognitive and behavior changes . 01/16/20 New Over the next 30 days, patient and spouse will verbalize having a good understanding of the treatment plan recommended by Dr. Krista Blue and will adhere to her recommendations . 01/16/20 New Over the next 90 days, spouse will be able to provide the care needed to patient without difficulty or caregiver burnout AEB spouse will report feeling satisfied with the  care she is able to provide Dylan [name] and will verbalize having all resources needed  CCM RN CM Interventions:  02/21/20: call completed with spouse Dylan [name] . Reviewed and discussed Dr. Rhea Belton recommendations to start Dylan [name] a trial dose of Carbidopa/Levodopa and to add an Exelon patch in addition to taking IR Namenda 10 mg bid . Determined spouse believes patient's symptoms of rigidity, tremors and slurred speech have improved since staring the new therapy . Reinforced the importance of patient taking this medication exactly as prescribed w/o missed or late doses for best effectiveness . Educated on potential wearing off effects and discussed this should be reported promptly if noticed   . Discussed patient's next f/u with [facility] is scheduled for 03/24/20 @ 1:15 PM to evaluate effectiveness of Carbidopa/Levodopa  . Discussed plans with [name] for ongoing care management follow up and confirmed she has the direct contact information for care management team  Patient Self Care Activities  . Attends all scheduled provider appointments . Unable to perform ADL's independently . Unable to perform IADL's independently  Please see past updates related to this goal by clicking on the "Past Updates" button in the selected goal         Patient verbalizes understanding of instructions provided today.   Telephone follow up appointment with care management team member scheduled for: 04/02/20  [name], RN, BSN, CCM Care Management Coordinator [place] [facility]  Direct Phone: [phone]

## 2020-03-12 ENCOUNTER — Telehealth: Payer: Medicare HMO

## 2020-03-12 ENCOUNTER — Other Ambulatory Visit: Payer: Self-pay

## 2020-03-12 ENCOUNTER — Ambulatory Visit (INDEPENDENT_AMBULATORY_CARE_PROVIDER_SITE_OTHER): Payer: Medicare HMO

## 2020-03-12 DIAGNOSIS — N1831 Chronic kidney disease, stage 3a: Secondary | ICD-10-CM | POA: Diagnosis not present

## 2020-03-12 DIAGNOSIS — I10 Essential (primary) hypertension: Secondary | ICD-10-CM

## 2020-03-12 DIAGNOSIS — N183 Chronic kidney disease, stage 3 unspecified: Secondary | ICD-10-CM | POA: Diagnosis not present

## 2020-03-12 DIAGNOSIS — F0391 Unspecified dementia with behavioral disturbance: Secondary | ICD-10-CM | POA: Diagnosis not present

## 2020-03-12 DIAGNOSIS — E1122 Type 2 diabetes mellitus with diabetic chronic kidney disease: Secondary | ICD-10-CM

## 2020-03-12 DIAGNOSIS — E1121 Type 2 diabetes mellitus with diabetic nephropathy: Secondary | ICD-10-CM

## 2020-03-16 NOTE — Patient Instructions (Signed)
Visit Information  Goals Addressed    . "He's eating too many sweets"   On track    Spouse stated Current Barriers:  [name] Knowledge Deficits related to diabetes Meal planning using the plate method and portion control . 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 12/05/19 New Over the next 90 days, patient and spouse will report improved adherence with Self monitoring his CBG's at home and will have increased knowledge of how to manage any abnormal readings . 12/05/19 New Over the next 90 days, patient/spouse will adhere to limiting sugary drinks and deserts AEB improved FBS within range of 80-130  CCM RN CM Interventions:  03/12/20 call completed with spouse [name] . Evaluation of current treatment plan related to diabetes and patient's adherence to plan as established by provider . Reinforced education to patient/spouse [name] re: importance of adherence to following a low carb diet using Meal Planning with the plate method with portion control; reviewed patient's recent A1c has decreased to 8.1 obtained on 12/23/19; reinforced education related to potential health risks for uncontrolled diabetes; reinforced importance to work on lowering A1c with goal <7.0  . Positive reinforcement given for making efforts to help patient adhere to his Diabetic treatment plan and lowering his A1c . Reviewed medications with patient and discussed patient is adhering to taking his weekly injection of Trulicity and patient is self injecting; with spouse [name] supervising dosage and administration . Discussed plans with patient for ongoing care management follow up and provided patient with direct contact information for care management  team . Reiterated to spouse [name], providing education and rationale, to check cbg daily before meals and record, calling the CCM team and or PCP for findings outside established parameters; FBS 80-130 and <180 after meals  Patient Self Care Activities:  . Attends all scheduled provider appointments . Currently UNABLE TO independently perform self-care without assistance  . Supportive caregiver, spouse [name] to assist with caregiver needs  Please see past updates related to this goal by clicking on the "Past Updates" button in the selected goal      . "His balance is much worse and he needs a walker"   On track    [place] (see longitudinal plan of care for additional care plan information)  Spouse states Current Barriers:  . Impaired gait/balance . High Risk for Falls . 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 . Newly diagnosed; Mild chronic microvascular ischemic changes  Nurse Case Manager Clinical Goal(s):  [name] Over the next 30 days, patient will work with the CCM team to address needs related to impaired gait disturbance and high risk for falls Goal Met  . 08/02/19 Over the next 30 days, patient will complete outpatient PT and will be able to adhere to his prescribed HEP Goal Partially Met . New 03/12/20 Over the next 90 days, caregiver will confirm patient is using his DME as directed by MD and PCP to assist with mobility and gait AS EVIDENCE BY patient will experience improved mobility w/o falls or injury related to falls  CCM RN CM Interventions:  03/12/20 call completed with spouse [name]  . Inter-disciplinary care team collaboration (see longitudinal plan of care) . Evaluation  of current treatment plan related to Impaired Physical Mobility and patient's adherence to plan as established by provider. . Provided education to patient re: Fall prevention tips and stressed importance of enforcing use of DME to help with  balance; Educated on importance of following HEP instructed by PT and keeping patient active to help keep up stamina, endurance and overall health  . Reviewed medications with patient and discussed patient's balance has improved with use of Carbidopa/Levodopa and Exelon patch . Discussed plans with patient for ongoing care management follow up and provided patient with direct contact information for care management team  Patient Self Care Activities:  . Attends all scheduled provider appointments . Currently UNABLE TO independently perform self-care without assistance  . Supportive caregiver, spouse [name] to assist with caregiver needs  Please see past updates related to this goal by clicking on the "Past Updates" button in the selected goal      . "I don't know what the next steps are for Mr. [place] Dementia"   On track    Spouse stated  Current Barriers:  [name] Knowledge Deficits related to next steps for diagnosis and treatment for new diagnosis of Dementia . Chronic Disease Management support and education needs related to DMII, Essential HTN, CKDIII, Dementia with behavior changes  Nurse Case Manager Clinical Goal(s):  [name] Over the next 14 days, spouse will collaborative with the Neuro office and receive instructions regarding brain imaging ordered for determination of type of Dementia - Goal Met  . Over the next 90 days, patient will verbalize understanding of plan for treatment management for new dx: Dementia  Goal Met . 01/16/20 New Over the next 14 days, patient will complete a follow up visit with Dr. Krista Blue, neurologist and spouse will report all new or worsening symptoms related to cognitive and behavior changes Goal Met  . 01/16/20 New Over the next 30 days, patient and spouse will verbalize having a good understanding of the treatment plan recommended by Dr. Krista Blue and will adhere to her recommendations Goal Met . 01/16/20 New Over the next 90 days, spouse will be able to provide the  care needed to patient without difficulty or caregiver burnout AEB spouse will report feeling satisfied with the care she is able to provide Mr. Borba and will verbalize having all resources needed  CCM RN CM Interventions:  03/12/20: call completed with spouse [name] . 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 Determined patient is adhering to his prescribed treatment plan with the assistance of his spouse [name]  . Determined spouse believes patient's symptoms of rigidity, tremors and slurred speech have improved since staring the new therapy . Reinforced the importance of patient taking this medication exactly as prescribed w/o missed or late doses for best effectiveness . Educated on potential wearing off effects and discussed this should be reported promptly if noticed  . Discussed patient's next f/u with [facility] is scheduled for 03/24/20 @ 1:15 PM to evaluate effectiveness of Carbidopa/Levodopa  . Discussed plans with [name] for ongoing care management follow up and confirmed she has the direct contact information for care management team  Patient Self Care Activities  . Attends all scheduled provider appointments . Currently UNABLE TO independently perform self-care without assistance  . Supportive caregiver, spouse [name] to assist with caregiver needs  Please see past updates related to this goal by clicking on the "Past Updates" button in the selected goal      . "  to evaluate and treat chronic persistent cough"   Not on track    [place] (see longitudinal plan of care for additional care plan information)  Current Barriers:  [name] Knowledge Deficits related to evaluation and treatment of persistent cough  . Chronic Disease Management support and education needs related to Type II DM, non-insulin dependent, CKD, stage III, Essential hypertension . Cognitive Deficits . Parkinsonism . Risk for Aspiration    Nurse Case Manager Clinical Goal(s):  [name] Over the next 90 days, patient will work with established Neurologist and PCP to address needs related to evaluation and treatment of persistent cough and or aspiration.   Interventions:  03/12/20 Inbound call completed spouse  . Inter-disciplinary care team collaboration (see longitudinal plan of care) . Evaluation of current treatment plan related to persistent cough with risk for aspiration and patient's adherence to plan as established by provider . Determined patient has developed a "wet" cough that comes and goes and spouse is concerned . Provided education to patient re: risk for aspiration with Parkinsonism; Educated on s/s suggestive of aspiration; Educated on aspiration prevention tips and importance of reporting symptoms as soon as possible if they occur; discussed patient may need to be evaluated for impaired swallowing and speech; Discussed this RNCM will notify Dr. Rhea Belton office of the reported symptoms and request a swallowing evaluation be ordered; Educated on the [employer] and potential benefits of patient receiving this service if found appropriate by Neurologist  . Collaborated with [name] NP with [facility] regarding patient has developed persistent "wet" cough that occurs on and off and may be early sign of aspiration associated with his symptoms of Parkinsonism; Requested this patient be considered for a swallowing evaluation and [employer] training; Advised patient's caregiver and spouse [name] reports symtpoms of rigidity and balance have improved along with patient's processing of information and cognition; Advised patient is still having some difficulty with feeding himself using eating utensils and may benefit from receiving OT . Discussed plans with patient for ongoing care management follow up and provided patient with direct contact information for care management team . Provided patient with printed  educational materials related to Risk for Aspiration and the [employer] training program  Patient Self Care Activities:  . Attends all scheduled provider appointments . Currently UNABLE TO independently perform self-care without assistance  . Supportive caregiver, spouse [name] to assist with caregiver needs  Initial goal documentation       Patient verbalizes understanding of instructions provided today.   Telephone follow up appointment with care management team member scheduled for: 04/02/20  [name], RN, BSN, CCM Care Management Coordinator [facility]  Direct Phone: [phone]

## 2020-03-16 NOTE — Chronic Care Management (AMB) (Signed)
Chronic Care Management   Follow Up Note   03/12/2020 Name: Dylan [name]. MRN: [id number] DOB: [birth year]  Referred by: [name], MD Reason for referral : Chronic Care Management (FU RN Call - Inbound call from spouse)   Dylan [name]. is a 79 y.o. year old male who is a primary care patient of [name], MD. The CCM team was consulted for assistance with chronic disease management and care coordination needs.    Review of patient status, including review of consultants reports, relevant laboratory and other test results, and collaboration with appropriate care team members and the patient's provider was performed as part of comprehensive patient evaluation and provision of chronic care management services.    SDOH (Social Determinants of Health) assessments performed: Yes - No Acute Challenges Identified today  See Care Plan activities for detailed interventions related to [place])   Inbound call received from patient's spouse Dylan [name] to discuss concerns about a persistent "wet cough" Mr. Vandam has developed.     Outpatient Encounter Medications as of 03/12/2020  Medication Sig Note  . allopurinol (ZYLOPRIM) 100 MG tablet TAKE 1 TABLET BY MOUTH EVERY DAY   . amLODipine (NORVASC) 5 MG tablet TAKE 1 TABLET BY MOUTH DAILY   . Ascorbic Acid (VITAMIN C) 1000 MG tablet Take 1,000 mg by mouth daily.   [name] aspirin 81 MG tablet Take 81 mg by mouth 3 (three) times a week.    . carbidopa-levodopa (SINEMET IR) 25-100 MG tablet Take 1 tablet by mouth 3 (three) times daily.   . carvedilol (COREG) 25 MG tablet Take 1 tablet (25 mg total) by mouth 2 (two) times daily.   . Cholecalciferol (VITAMIN D3) 5000 units CAPS Take 5,000 Units by mouth every evening.   . Coenzyme Q10 200 MG capsule Take 200 mg by mouth daily.   [name] COLCRYS 0.6 MG tablet Take 1 tablet (0.6 mg total) by mouth daily.   . diclofenac Sodium (VOLTAREN) 1 % GEL Apply 2 g topically 3 (three) times daily.   [name] glucose  blood (ONETOUCH VERIO) test strip Use as instructed to check blood sugars daily dx: e11.22   . Lancet Devices (ONE TOUCH DELICA LANCING DEV) MISC Please fill ONE TOUCH DELICA LANCING DEVICE.  Use to test blood sugar twice daily as directed. E11.65   . Lancets MISC Please fill basic/generic push button lancets.  Patient to test blood sugar twice daily. DX: E11.65   . Melatonin 10 MG TABS Take 10 mg by mouth at bedtime.   . memantine (NAMENDA) 10 MG tablet Take 1 tablet (10 mg total) by mouth 2 (two) times daily. 12/05/2019: Patient is only taking 1 tablet in the am but is holding the pm dose due to having headaches  . metFORMIN (GLUCOPHAGE) 1000 MG tablet TAKE 1 TABLET BY MOUTH EVERY DAY   . pravastatin (PRAVACHOL) 20 MG tablet TAKE 1 TABLET BY ORAL ROUTE EVERY DAY   . rivastigmine (EXELON) 4.6 mg/24hr Place 1 patch (4.6 mg total) onto the skin daily.   . TRULICITY 1.5 EL/3.8BO SOPN INJECT 1.5 MG INTO THE SKIN ONCE A WEEK.   . valsartan (DIOVAN) 320 MG tablet TAKE 1 TABLET BY MOUTH EVERY DAY    No facility-administered encounter medications on file as of 03/12/2020.     Objective:  Lab Results  Component Value Date   HGBA1C 8.1 (H) 12/23/2019   HGBA1C 10.5 (H) 09/19/2019   HGBA1C 8.3 (H) 07/24/2019   Lab Results  Component Value Date  MICROALBUR 80 01/22/2019   LDLCALC 66 07/24/2019   CREATININE 1.47 (H) 10/17/2019   BP Readings from Last 3 Encounters:  01/29/20 (!) 142/68  12/23/19 116/82  11/05/19 136/68    Goals Addressed    . "He's eating too many sweets"   On track    Spouse stated Current Barriers:  [name] Knowledge Deficits related to diabetes Meal planning using the plate method and portion control . Chronic Disease Management support and education needs related to CKDIII, DMII, HTN, memory loss  Nurse Case Manager Clinical Goal(s):  [name] Over the next 90 days, patient will work with the CCM team to address needs related to diabetes Self Health management specifically related to  nutritional recommendations  Goal Met . 12/05/19 New Over the next 90 days, patient and spouse will report improved adherence with Self monitoring his CBG's at home and will have increased knowledge of how to manage any abnormal readings . 12/05/19 New Over the next 90 days, patient/spouse will adhere to limiting sugary drinks and deserts AEB improved FBS within range of 80-130  CCM RN CM Interventions:  03/12/20 call completed with spouse Dylan [name] . Evaluation of current treatment plan related to diabetes and patient's adherence to plan as established by provider . Reinforced education to patient/spouse Dylan [name] re: importance of adherence to following a low carb diet using Meal Planning with the plate method with portion control; reviewed patient's recent A1c has decreased to 8.1 obtained on 12/23/19; reinforced education related to potential health risks for uncontrolled diabetes; reinforced importance to work on lowering A1c with goal <7.0  . Positive reinforcement given for making efforts to help patient adhere to his Diabetic treatment plan and lowering his A1c . Reviewed medications with patient and discussed patient is adhering to taking his weekly injection of Trulicity and patient is self injecting; with spouse Dylan [name] supervising dosage and administration . Discussed plans with patient for ongoing care management follow up and provided patient with direct contact information for care management team . Reiterated to spouse Dylan [name], providing education and rationale, to check cbg daily before meals and record, calling the CCM team and or PCP for findings outside established parameters; FBS 80-130 and <180 after meals  Patient Self Care Activities:  . Attends all scheduled provider appointments . Currently UNABLE TO independently perform self-care without assistance  . Supportive caregiver, spouse Dylan [name] to assist with caregiver needs  Please see past updates related to this goal by clicking on  the "Past Updates" button in the selected goal      . "His balance is much worse and he needs a walker"   On track    [place] (see longitudinal plan of care for additional care plan information)  Spouse states Current Barriers:  . Impaired gait/balance . High Risk for Falls . 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 . Newly diagnosed; Mild chronic microvascular ischemic changes  Nurse Case Manager Clinical Goal(s):  [name] Over the next 30 days, patient will work with the CCM team to address needs related to impaired gait disturbance and high risk for falls Goal Met  . 08/02/19 Over the next 30 days, patient will complete outpatient PT and will be able to adhere to his prescribed HEP Goal Partially Met . New 03/12/20 Over the next 90 days, caregiver will confirm patient is using his DME as directed by MD and PCP to assist with mobility and gait AS EVIDENCE BY patient will experience improved  mobility w/o falls or injury related to falls  CCM RN CM Interventions:  03/12/20 call completed with spouse Dylan [name]  . Inter-disciplinary care team collaboration (see longitudinal plan of care) . Evaluation of current treatment plan related to Impaired Physical Mobility and patient's adherence to plan as established by provider. . Provided education to patient re: Fall prevention tips and stressed importance of enforcing use of DME to help with balance; Educated on importance of following HEP instructed by PT and keeping patient active to help keep up stamina, endurance and overall health  . Reviewed medications with patient and discussed patient's balance has improved with use of Carbidopa/Levodopa and Exelon patch . Discussed plans with patient for ongoing care management follow up and provided patient with direct contact information for care management team  Patient Self Care Activities:  . Attends all scheduled provider  appointments . Currently UNABLE TO independently perform self-care without assistance  . Supportive caregiver, spouse Dylan [name] to assist with caregiver needs  Please see past updates related to this goal by clicking on the "Past Updates" button in the selected goal      . "I don't know what the next steps are for Mr. Heemstra Dementia"   On track    Spouse stated  Current Barriers:  [name] Knowledge Deficits related to next steps for diagnosis and treatment for new diagnosis of Dementia . Chronic Disease Management support and education needs related to DMII, Essential HTN, CKDIII, Dementia with behavior changes  Nurse Case Manager Clinical Goal(s):  [name] Over the next 14 days, spouse will collaborative with the Neuro office and receive instructions regarding brain imaging ordered for determination of type of Dementia - Goal Met  . Over the next 90 days, patient will verbalize understanding of plan for treatment management for new dx: Dementia  Goal Met . 01/16/20 New Over the next 14 days, patient will complete a follow up visit with Dr. Krista Blue, neurologist and spouse will report all new or worsening symptoms related to cognitive and behavior changes Goal Met  . 01/16/20 New Over the next 30 days, patient and spouse will verbalize having a good understanding of the treatment plan recommended by Dr. Krista Blue and will adhere to her recommendations Goal Met . 01/16/20 New Over the next 90 days, spouse will be able to provide the care needed to patient without difficulty or caregiver burnout AEB spouse will report feeling satisfied with the care she is able to provide Mr. Meggett and will verbalize having all resources needed  CCM RN CM Interventions:  03/12/20: call completed with spouse Dylan [name] . 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 Determined patient is adhering to his prescribed treatment plan with the assistance of his spouse Dylan [name]   . Determined spouse believes patient's symptoms of rigidity, tremors and slurred speech have improved since staring the new therapy . Reinforced the importance of patient taking this medication exactly as prescribed w/o missed or late doses for best effectiveness . Educated on potential wearing off effects and discussed this should be reported promptly if noticed  . Discussed patient's next f/u with [facility] is scheduled for 03/24/20 @ 1:15 PM to evaluate effectiveness of Carbidopa/Levodopa  . Discussed plans with [name] for ongoing care management follow up and confirmed she has the direct contact information for care management team  Patient Self Care Activities  . Attends all scheduled provider appointments . Currently UNABLE TO independently perform self-care without assistance  . Supportive caregiver,  spouse Dylan [name] to assist with caregiver needs  Please see past updates related to this goal by clicking on the "Past Updates" button in the selected goal      . "to evaluate and [name] chronic persistent cough"   Not on track    Falls (see longitudinal plan of care for additional care plan information)  Current Barriers:  [name] Knowledge Deficits related to evaluation and treatment of persistent cough  . Chronic Disease Management support and education needs related to Type II DM, non-insulin dependent, CKD, stage III, Essential hypertension . Cognitive Deficits . Parkinsonism . Risk for Aspiration   Nurse Case Manager Clinical Goal(s):  [name] Over the next 90 days, patient will work with established Neurologist and PCP to address needs related to evaluation and treatment of persistent cough and or aspiration.   Interventions:  03/12/20 Inbound call completed spouse  . Inter-disciplinary care team collaboration (see longitudinal plan of care) . Evaluation of current treatment plan related to persistent cough with risk for aspiration and patient's adherence to plan as established by  provider . Determined patient has developed a "wet" cough that comes and goes and spouse is concerned . Provided education to patient re: risk for aspiration with Parkinsonism; Educated on s/s suggestive of aspiration; Educated on aspiration prevention tips and importance of reporting symptoms as soon as possible if they occur; discussed patient may need to be evaluated for impaired swallowing and speech; Discussed this RNCM will notify Dr. Rhea Belton office of the reported symptoms and request a swallowing evaluation be ordered; Educated on the [employer] and potential benefits of patient receiving this service if found appropriate by Neurologist  . Collaborated with [name] NP with [place] regarding patient has developed persistent "wet" cough that occurs on and off and may be early sign of aspiration associated with his symptoms of Parkinsonism; Requested this patient be considered for a swallowing evaluation and [employer] training; Advised patient's caregiver and spouse [name] reports symtpoms of rigidity and balance have improved along with patient's processing of information and cognition; Advised patient is still having some difficulty with feeding himself using eating utensils and may benefit from receiving OT . Discussed plans with patient for ongoing care management follow up and provided patient with direct contact information for care management team . Provided patient with printed educational materials related to Risk for Aspiration and the [employer] training program  Patient Self Care Activities:  . Attends all scheduled provider appointments . Currently UNABLE TO independently perform self-care without assistance  . Supportive caregiver, spouse Dylan [name] to assist with caregiver needs  Initial goal documentation       Plan:   Telephone follow up appointment with care management team member scheduled for: 04/02/20  [name], RN, BSN,  CCM Care Management Coordinator [facility]  Direct Phone: [phone]

## 2020-03-23 ENCOUNTER — Ambulatory Visit: Payer: Medicare HMO | Admitting: Internal Medicine

## 2020-03-23 LAB — CUP PACEART REMOTE DEVICE CHECK
Battery Remaining Longevity: 101 mo
Battery Remaining Percentage: 95.5 %
Battery Voltage: 2.99 V
Brady Statistic AP VP Percent: 10 %
Brady Statistic AP VS Percent: 1 %
Brady Statistic AS VP Percent: 90 %
Brady Statistic AS VS Percent: 1 %
Brady Statistic RA Percent Paced: 9.1 %
Brady Statistic RV Percent Paced: 99 %
Date Time Interrogation Session: 20210524020022
Implantable Lead Implant Date: 20180911
Implantable Lead Implant Date: 20180911
Implantable Lead Location: 753859
Implantable Lead Location: 753860
Implantable Pulse Generator Implant Date: 20180911
Lead Channel Impedance Value: 390 Ohm
Lead Channel Impedance Value: 400 Ohm
Lead Channel Pacing Threshold Amplitude: 0.5 V
Lead Channel Pacing Threshold Amplitude: 0.75 V
Lead Channel Pacing Threshold Pulse Width: 0.5 ms
Lead Channel Pacing Threshold Pulse Width: 0.5 ms
Lead Channel Sensing Intrinsic Amplitude: 10.9 mV
Lead Channel Sensing Intrinsic Amplitude: 3.3 mV
Lead Channel Setting Pacing Amplitude: 2 V
Lead Channel Setting Pacing Amplitude: 2.5 V
Lead Channel Setting Pacing Pulse Width: 0.5 ms
Lead Channel Setting Sensing Sensitivity: 4 mV
Pulse Gen Model: 2272
Pulse Gen Serial Number: 8940191

## 2020-03-24 ENCOUNTER — Encounter: Payer: Self-pay | Admitting: Internal Medicine

## 2020-03-24 ENCOUNTER — Encounter: Payer: Self-pay | Admitting: Neurology

## 2020-03-24 ENCOUNTER — Ambulatory Visit: Payer: Medicare HMO | Admitting: Neurology

## 2020-03-24 ENCOUNTER — Other Ambulatory Visit: Payer: Self-pay

## 2020-03-24 ENCOUNTER — Ambulatory Visit (INDEPENDENT_AMBULATORY_CARE_PROVIDER_SITE_OTHER): Payer: Medicare HMO | Admitting: Internal Medicine

## 2020-03-24 VITALS — BP 142/86 | HR 57 | Ht 68.0 in | Wt 189.0 lb

## 2020-03-24 VITALS — BP 124/78 | HR 62 | Temp 98.2°F | Ht 68.0 in | Wt 189.2 lb

## 2020-03-24 DIAGNOSIS — E1122 Type 2 diabetes mellitus with diabetic chronic kidney disease: Secondary | ICD-10-CM

## 2020-03-24 DIAGNOSIS — I1310 Hypertensive heart and chronic kidney disease without heart failure, with stage 1 through stage 4 chronic kidney disease, or unspecified chronic kidney disease: Secondary | ICD-10-CM | POA: Diagnosis not present

## 2020-03-24 DIAGNOSIS — N1831 Chronic kidney disease, stage 3a: Secondary | ICD-10-CM

## 2020-03-24 DIAGNOSIS — G20 Parkinson's disease: Secondary | ICD-10-CM | POA: Diagnosis not present

## 2020-03-24 DIAGNOSIS — E1121 Type 2 diabetes mellitus with diabetic nephropathy: Secondary | ICD-10-CM | POA: Diagnosis not present

## 2020-03-24 DIAGNOSIS — M1A30X0 Chronic gout due to renal impairment, unspecified site, without tophus (tophi): Secondary | ICD-10-CM

## 2020-03-24 DIAGNOSIS — E538 Deficiency of other specified B group vitamins: Secondary | ICD-10-CM

## 2020-03-24 DIAGNOSIS — F0391 Unspecified dementia with behavioral disturbance: Secondary | ICD-10-CM

## 2020-03-24 MED ORDER — CARBIDOPA-LEVODOPA 25-100 MG PO TABS: 1.5000 | ORAL_TABLET | Freq: Three times a day (TID) | ORAL | 11 refills | Status: DC

## 2020-03-24 MED ORDER — CYANOCOBALAMIN 1000 MCG/ML IJ SOLN
1000.0000 ug | Freq: Once | INTRAMUSCULAR | Status: AC
  Administered 2020-03-24: 1000 ug via INTRAMUSCULAR
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
## 2020-03-24 NOTE — Patient Instructions (Signed)
Increase Sinemet 1.5 tablets 3 times daily  Continue other medications I will refer you to speech therapy for evaluation  See you back in 4 months

## 2020-03-24 NOTE — Progress Notes (Signed)
This visit occurred during the SARS-CoV-2 public health emergency.  Safety protocols were in place, including screening questions prior to the visit, additional usage of staff PPE, and extensive cleaning of exam room while observing appropriate contact time as indicated for disinfecting solutions.  Subjective:     Patient ID: Dylan [name]. , male    DOB: 1941/07/25 , 79 y.o.   MRN: [id number]   Chief Complaint  Patient presents with  . Diabetes  . Hypertension    HPI  He presents today for DM check. He is accompanied by his wife. Neither offer any complaints/concerns today.   Diabetes He presents for his follow-up diabetic visit. He has type 2 diabetes mellitus. There are no hypoglycemic associated symptoms. Pertinent negatives for diabetes include no blurred vision and no chest pain. There are no hypoglycemic complications. Diabetic complications include nephropathy. Risk factors for coronary artery disease include diabetes mellitus, dyslipidemia, hypertension, sedentary lifestyle, male sex and obesity. He is compliant with treatment some of the time. He is following a diabetic diet. He participates in exercise intermittently. His breakfast blood glucose is taken between 9-10 am. His breakfast blood glucose range is generally 140-180 mg/dl. An ACE inhibitor/angiotensin II receptor blocker is being taken. He does not see a podiatrist.Eye exam is current.  Hypertension This is a chronic problem. The current episode started more than 1 year ago. The problem has been gradually improving since onset. The problem is controlled. Pertinent negatives include no blurred vision or chest pain. Risk factors for coronary artery disease include diabetes mellitus, dyslipidemia, male gender, obesity and sedentary lifestyle. There is no history of sleep apnea.     Past Medical History:  Diagnosis Date  . Anxiety   . Aortic stenosis    mild AS 11/24/16 (peak grad 24, mean grad 11) Dr. [name]  . CHB  (complete heart block) ([place]) 07/2017  . 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 Diabetes mellitus without complication ([place])   . Gait abnormality   . Gout   . Hyperlipemia   . Hypertension   . 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 Memory loss   . Peripheral arterial disease ([place])   . 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 Second degree AV block    Wenckebach; no indication for pacemaker as of 12/01/16 (Dr. [name])  . Vitamin B12 deficiency anemia 03/01/2018  . Vitamin D deficiency disease      Family History  Problem Relation Age of Onset  . Diabetes Mother   . Breast cancer Mother   . Arthritis Mother   . Hypertension Father   . Diabetes Sister   . Diabetes Brother   . Diabetes Maternal Grandmother   . Diabetes Brother   . Diabetes Brother   . Diabetes Brother   . Diabetes Sister   . Diabetes Sister   . Diabetes Sister      Current Outpatient Medications:  .  allopurinol (ZYLOPRIM) 100 MG tablet, TAKE 1 TABLET BY MOUTH EVERY DAY, Disp: 30 tablet, Rfl: 5 .  amLODipine (NORVASC) 5 MG tablet, TAKE 1 TABLET BY MOUTH DAILY, Disp: 30 tablet, Rfl: 5 .  Ascorbic Acid (VITAMIN C) 1000 MG tablet, Take 1,000 mg by mouth daily., Disp: , Rfl:  .  aspirin 81 MG tablet, Take 81 mg by mouth 3 (three) times a week. , Disp: , Rfl:  .  carbidopa-levodopa (SINEMET IR) 25-100 MG tablet, Take 1.5 tablets by mouth 3 (three) times daily., Disp: 130 tablet,  Rfl: 11 .  carvedilol (COREG) 25 MG tablet, Take 1 tablet (25 mg total) by mouth 2 (two) times daily., Disp: 60 tablet, Rfl: 6 .  Cholecalciferol (VITAMIN D3) 5000 units CAPS, Take 5,000 Units by mouth every evening., Disp: , Rfl:  .  Coenzyme Q10 200 MG capsule, Take 200 mg by mouth daily., Disp: , Rfl:  .  COLCRYS 0.6 MG tablet, Take 1 tablet (0.6 mg total) by mouth daily., Disp: 90 tablet, Rfl: 1 .  diclofenac Sodium (VOLTAREN) 1 % GEL, Apply 2 g topically 3 (three) times daily., Disp: 150 g, Rfl:  0 .  glucose blood (ONETOUCH VERIO) test strip, Use as instructed to check blood sugars daily dx: e11.22, Disp: 150 each, Rfl: 3 .  Lancet Devices (ONE TOUCH DELICA LANCING DEV) MISC, Please fill ONE TOUCH DELICA LANCING DEVICE.  Use to test blood sugar twice daily as directed. E11.65, Disp: 1 each, Rfl: 1 .  Lancets MISC, Please fill basic/generic push button lancets.  Patient to test blood sugar twice daily. DX: E11.65, Disp: 100 each, Rfl: 4 .  Melatonin 10 MG TABS, Take 10 mg by mouth at bedtime., Disp: , Rfl:  .  memantine (NAMENDA) 10 MG tablet, Take 1 tablet (10 mg total) by mouth 2 (two) times daily., Disp: 180 tablet, Rfl: 4 .  metFORMIN (GLUCOPHAGE) 1000 MG tablet, TAKE 1 TABLET BY MOUTH EVERY DAY, Disp: 30 tablet, Rfl: 2 .  pravastatin (PRAVACHOL) 20 MG tablet, TAKE 1 TABLET BY ORAL ROUTE EVERY DAY, Disp: 30 tablet, Rfl: 2 .  rivastigmine (EXELON) 4.6 mg/24hr, Place 1 patch (4.6 mg total) onto the skin daily., Disp: 30 patch, Rfl: 12 .  TRULICITY 1.5 QI/6.9GE SOPN, INJECT 1.5 MG INTO THE SKIN ONCE A WEEK., Disp: 12 pen, Rfl: 1 .  valsartan (DIOVAN) 320 MG tablet, TAKE 1 TABLET BY MOUTH EVERY DAY, Disp: 30 tablet, Rfl: 8   No Known Allergies   Review of Systems  Constitutional: Negative.   Eyes: Negative for blurred vision.  Respiratory: Negative.   Cardiovascular: Negative.  Negative for chest pain.  Gastrointestinal: Negative.   Neurological: Negative.   Psychiatric/Behavioral: Negative.      Today's Vitals   03/24/20 1535  BP: 124/78  Pulse: 62  Temp: 98.2 F (36.8 C)  TempSrc: Oral  Weight: 189 lb 3.2 oz (85.8 kg)  Height: _0  (1.727 m)  PainSc: 0-No pain   Body mass index is 28.77 kg/m.   Objective:  Physical Exam Vitals and nursing note reviewed.  Constitutional:      Appearance: Normal appearance.  Cardiovascular:     Rate and Rhythm: Normal rate and regular rhythm.     Heart sounds: Normal heart sounds.  Pulmonary:     Effort: Pulmonary effort is  normal.     Breath sounds: Normal breath sounds.  Skin:    General: Skin is warm.  Neurological:     General: No focal deficit present.     Mental Status: He is alert.     Comments: Shuffling gait  Psychiatric:        Mood and Affect: Mood normal.         Assessment And Plan:     1. Type 2 diabetes mellitus with stage 3a chronic kidney disease, without long-term current use of insulin (HCC)  Chronic, I will check labs as listed below. Importance of dietary and medication compliance was discussed with the patient. Encouraged to increase daily activity. He was given chair exercises to perform.   -  CMP14+EGFR - Hemoglobin A1c  2. Hypertensive heart and renal disease with renal failure, stage 1 through stage 4 or unspecified chronic kidney disease, without heart failure  Chronic, well controlled. He will continue with current meds. He is encouraged to avoid adding salt to his foods.   3. Chronic gout due to renal impairment without tophus, unspecified site  Chronic. I will check uric acid level today. He denies using colchicine since his last visit.   - Uric acid  4. Vitamin B12 deficiency  I will check vitamin B12 level today, prior to administering vit b12 injection.   - Vitamin B12 - cyanocobalamin ((VITAMIN B-12)) injection 1,000 mcg        Maximino Greenland, MD    THE PATIENT IS ENCOURAGED TO PRACTICE SOCIAL DISTANCING DUE TO THE COVID-19 PANDEMIC.

## 2020-03-24 NOTE — Patient Instructions (Signed)
Exercises To Do While Sitting  Exercises that you do while sitting (chair exercises) can give you many of the same benefits as full exercise. Benefits include strengthening your heart, burning calories, and keeping muscles and joints healthy. Exercise can also improve your mood and help with depression and anxiety. You may benefit from chair exercises if you are unable to do standing exercises because of:  Diabetic foot pain.  Obesity.  Illness.  Arthritis.  Recovery from surgery or injury.  Breathing problems.  Balance problems.  Another type of disability. Before starting chair exercises, check with your health care provider or a physical therapist to find out how much exercise you can tolerate and which exercises are safe for you. If your health care provider approves:  Start out slowly and build up over time. Aim to work up to about 10-20 minutes for each exercise session.  Make exercise part of your daily routine.  Drink water when you exercise. Do not wait until you are thirsty. Drink every 10-15 minutes.  Stop exercising right away if you have pain, nausea, shortness of breath, or dizziness.  If you are exercising in a wheelchair, make sure to lock the wheels.  Ask your health care provider whether you can do tai chi or yoga. Many positions in these mind-body exercises can be modified to do while seated. Warm-up Before starting other exercises: 1. Sit up as straight as you can. Have your knees bent at 90 degrees, which is the shape of the capital letter "L." Keep your feet flat on the floor. 2. Sit at the front edge of your chair, if you can. 3. Pull in (tighten) the muscles in your abdomen and stretch your spine and neck as straight as you can. Hold this position for a few minutes. 4. Breathe in and out evenly. Try to concentrate on your breathing, and relax your mind. Stretching Exercise A: Arm stretch 1. Hold your arms out straight in front of your body. 2. Bend  your hands at the wrist with your fingers pointing up, as if signaling someone to stop. Notice the slight tension in your forearms as you hold the position. 3. Keeping your arms out and your hands bent, rotate your hands outward as far as you can and hold this stretch. Aim to have your thumbs pointing up and your pinkie fingers pointing down. Slowly repeat arm stretches for one minute as tolerated. Exercise B: Leg stretch 1. If you can move your legs, try to "draw" letters on the floor with the toes of your foot. Write your name with one foot. 2. Write your name with the toes of your other foot. Slowly repeat the movements for one minute as tolerated. Exercise C: Reach for the sky 1. Reach your hands as far over your head as you can to stretch your spine. 2. Move your hands and arms as if you are climbing a rope. Slowly repeat the movements for one minute as tolerated. Range of motion exercises Exercise A: Shoulder roll 1. Let your arms hang loosely at your sides. 2. Lift just your shoulders up toward your ears, then let them relax back down. 3. When your shoulders feel loose, rotate your shoulders in backward and forward circles. Do shoulder rolls slowly for one minute as tolerated. Exercise B: March in place 1. As if you are marching, pump your arms and lift your legs up and down. Lift your knees as high as you can. ? If you are unable to lift your knees,  just pump your arms and move your ankles and feet up and down. March in place for one minute as tolerated. Exercise C: Seated jumping jacks 1. Let your arms hang down straight. 2. Keeping your arms straight, lift them up over your head. Aim to point your fingers to the ceiling. 3. While you lift your arms, straighten your legs and slide your heels along the floor to your sides, as wide as you can. 4. As you bring your arms back down to your sides, slide your legs back together. ? If you are unable to use your legs, just move your  arms. Slowly repeat seated jumping jacks for one minute as tolerated. Strengthening exercises Exercise A: Shoulder squeeze 1. Hold your arms straight out from your body to your sides, with your elbows bent and your fists pointed at the ceiling. 2. Keeping your arms in the bent position, move them forward so your elbows and forearms meet in front of your face. 3. Open your arms back out as wide as you can with your elbows still bent, until you feel your shoulder blades squeezing together. Hold for 5 seconds. Slowly repeat the movements forward and backward for one minute as tolerated. Contact a health care provider if you:  Had to stop exercising due to any of the following: ? Pain. ? Nausea. ? Shortness of breath. ? Dizziness. ? Fatigue.  Have significant pain or soreness after exercising. Get help right away if you have:  Chest pain.  Difficulty breathing. These symptoms may represent a serious problem that is an emergency. Do not wait to see if the symptoms will go away. Get medical help right away. Call your local emergency services (911 in the [place] Do not drive yourself to the hospital. This information is not intended to replace advice given to you by your health care provider. Make sure you discuss any questions you have with your health care provider. Document Revised: 02/07/2019 Document Reviewed: 08/30/2017 Elsevier Patient Education  2020 [employer].

## 2020-03-25 ENCOUNTER — Ambulatory Visit (INDEPENDENT_AMBULATORY_CARE_PROVIDER_SITE_OTHER): Payer: Medicare HMO | Admitting: *Deleted

## 2020-03-25 DIAGNOSIS — I442 Atrioventricular block, complete: Secondary | ICD-10-CM

## 2020-03-25 LAB — CMP14+EGFR
ALT: 14 IU/L (ref 0–44)
AST: 19 IU/L (ref 0–40)
Albumin/Globulin Ratio: 1.9 (ref 1.2–2.2)
Albumin: 4.5 g/dL (ref 3.7–4.7)
Alkaline Phosphatase: 128 IU/L — ABNORMAL HIGH (ref 48–121)
BUN/Creatinine Ratio: 16 (ref 10–24)
BUN: 25 mg/dL (ref 8–27)
Bilirubin Total: 0.3 mg/dL (ref 0.0–1.2)
CO2: 23 mmol/L (ref 20–29)
Calcium: 10 mg/dL (ref 8.6–10.2)
Chloride: 102 mmol/L (ref 96–106)
Creatinine, Ser: 1.56 mg/dL — ABNORMAL HIGH (ref 0.76–1.27)
GFR calc Af Amer: 48 mL/min/{1.73_m2} — ABNORMAL LOW (ref >59)
GFR calc non Af Amer: 42 mL/min/{1.73_m2} — ABNORMAL LOW (ref >59)
Globulin, Total: 2.4 g/dL (ref 1.5–4.5)
Glucose: 133 mg/dL — ABNORMAL HIGH (ref 65–99)
Potassium: 5 mmol/L (ref 3.5–5.2)
Sodium: 141 mmol/L (ref 134–144)
Total Protein: 6.9 g/dL (ref 6.0–8.5)

## 2020-03-25 LAB — VITAMIN B12: Vitamin B-12: 253 pg/mL (ref 232–1245)

## 2020-03-25 LAB — URIC ACID: Uric Acid: 6.5 mg/dL (ref 3.8–8.4)

## 2020-03-25 LAB — HEMOGLOBIN A1C
Est. average glucose Bld gHb Est-mCnc: 169 mg/dL
Hgb A1c MFr Bld: 7.5 % — ABNORMAL HIGH (ref 4.8–5.6)

## 2020-03-26 ENCOUNTER — Telehealth: Payer: Medicare HMO

## 2020-03-26 ENCOUNTER — Ambulatory Visit: Payer: Self-pay

## 2020-03-26 DIAGNOSIS — I1310 Hypertensive heart and chronic kidney disease without heart failure, with stage 1 through stage 4 chronic kidney disease, or unspecified chronic kidney disease: Secondary | ICD-10-CM

## 2020-03-26 DIAGNOSIS — E1122 Type 2 diabetes mellitus with diabetic chronic kidney disease: Secondary | ICD-10-CM

## 2020-03-26 DIAGNOSIS — N1831 Type 2 diabetes mellitus with diabetic chronic kidney disease: Secondary | ICD-10-CM

## 2020-03-26 NOTE — Progress Notes (Signed)
Remote pacemaker transmission.   

## 2020-03-26 NOTE — Chronic Care Management (AMB) (Signed)
  Chronic Care Management   Outreach Note  03/26/2020 Name: Dylan [name]. MRN: [id number] DOB: Jan 10, 1941  Referred by: [name], MD Reason for referral : Care Coordination   SW placed an unsuccessful outbound call to patients caregiver [name] to assist with ongoing care management and care coordination needs. SW left a HIPAA compliant voice message requesting a return call.  Follow Up Plan: The care management team will reach out to the patient again over the next 14 days.   Daneen Schick, BSW, CDP Social Worker, Certified Dementia Practitioner [facility] [phone]

## 2020-03-31 ENCOUNTER — Ambulatory Visit (INDEPENDENT_AMBULATORY_CARE_PROVIDER_SITE_OTHER): Payer: Medicare HMO

## 2020-03-31 ENCOUNTER — Other Ambulatory Visit: Payer: Self-pay

## 2020-03-31 VITALS — BP 120/70 | HR 68 | Temp 98.1°F | Ht 68.0 in | Wt 191.0 lb

## 2020-03-31 DIAGNOSIS — E538 Deficiency of other specified B group vitamins: Secondary | ICD-10-CM | POA: Diagnosis not present

## 2020-03-31 MED ORDER — ALLOPURINOL 100 MG PO TABS: 200.0000 mg | ORAL_TABLET | Freq: Every day | ORAL | 5 refills | Status: DC

## 2020-03-31 MED ORDER — CYANOCOBALAMIN 1000 MCG/ML IJ SOLN
1000.0000 ug | Freq: Once | INTRAMUSCULAR | Status: AC
  Administered 2020-03-31: 1000 ug via INTRAMUSCULAR

## 2020-03-31 NOTE — Progress Notes (Signed)
Pt is here today for his first of four b12 injection. Pt will come back every week.

## 2020-04-02 ENCOUNTER — Ambulatory Visit (INDEPENDENT_AMBULATORY_CARE_PROVIDER_SITE_OTHER): Payer: Medicare HMO

## 2020-04-02 ENCOUNTER — Other Ambulatory Visit: Payer: Self-pay

## 2020-04-02 ENCOUNTER — Telehealth: Payer: Medicare HMO

## 2020-04-02 DIAGNOSIS — N1831 Chronic kidney disease, stage 3a: Secondary | ICD-10-CM

## 2020-04-02 DIAGNOSIS — N183 Chronic kidney disease, stage 3 unspecified: Secondary | ICD-10-CM

## 2020-04-02 DIAGNOSIS — F0391 Unspecified dementia with behavioral disturbance: Secondary | ICD-10-CM

## 2020-04-02 DIAGNOSIS — E1122 Type 2 diabetes mellitus with diabetic chronic kidney disease: Secondary | ICD-10-CM

## 2020-04-02 DIAGNOSIS — I10 Essential (primary) hypertension: Secondary | ICD-10-CM

## 2020-04-03 NOTE — Chronic Care Management (AMB) (Signed)
Chronic Care Management   Follow Up Note   04/03/2020 Name: Dylan [name]. MRN: [id number] DOB: 21-Dec-1940  Referred by: [name], MD Reason for referral : Chronic Care Management (FU RN CM Call )   Dylan [name]. is a 79 y.o. year old male who is a primary care patient of [name], MD. The CCM team was consulted for assistance with chronic disease management and care coordination needs.    Review of patient status, including review of consultants reports, relevant laboratory and other test results, and collaboration with appropriate care team members and the patient's provider was performed as part of comprehensive patient evaluation and provision of chronic care management services.    SDOH (Social Determinants of Health) assessments performed: Yes - No Acute needs identified See Care Plan activities for detailed interventions related to SDOH)    Placed CCM RN CM outbound follow up call to spouse Dylan [name] for a care plan update.   Outpatient Encounter Medications as of 04/02/2020  Medication Sig Note  . allopurinol (ZYLOPRIM) 100 MG tablet Take 2 tablets (200 mg total) by mouth daily.   [name] amLODipine (NORVASC) 5 MG tablet TAKE 1 TABLET BY MOUTH DAILY   . Ascorbic Acid (VITAMIN C) 1000 MG tablet Take 1,000 mg by mouth daily.   [name] aspirin 81 MG tablet Take 81 mg by mouth 3 (three) times a week.    . carbidopa-levodopa (SINEMET IR) 25-100 MG tablet Take 1.5 tablets by mouth 3 (three) times daily.   . carvedilol (COREG) 25 MG tablet Take 1 tablet (25 mg total) by mouth 2 (two) times daily.   . Cholecalciferol (VITAMIN D3) 5000 units CAPS Take 5,000 Units by mouth every evening.   . Coenzyme Q10 200 MG capsule Take 200 mg by mouth daily.   [name] COLCRYS 0.6 MG tablet Take 1 tablet (0.6 mg total) by mouth daily.   . diclofenac Sodium (VOLTAREN) 1 % GEL Apply 2 g topically 3 (three) times daily.   [name] glucose blood (ONETOUCH VERIO) test strip Use as instructed to check blood sugars  daily dx: e11.22   . Lancet Devices (ONE TOUCH DELICA LANCING DEV) MISC Please fill ONE TOUCH DELICA LANCING DEVICE.  Use to test blood sugar twice daily as directed. E11.65   . Lancets MISC Please fill basic/generic push button lancets.  Patient to test blood sugar twice daily. DX: E11.65   . Melatonin 10 MG TABS Take 10 mg by mouth at bedtime.   . memantine (NAMENDA) 10 MG tablet Take 1 tablet (10 mg total) by mouth 2 (two) times daily. 12/05/2019: Patient is only taking 1 tablet in the am but is holding the pm dose due to having headaches  . metFORMIN (GLUCOPHAGE) 1000 MG tablet TAKE 1 TABLET BY MOUTH EVERY DAY   . pravastatin (PRAVACHOL) 20 MG tablet TAKE 1 TABLET BY ORAL ROUTE EVERY DAY   . rivastigmine (EXELON) 4.6 mg/24hr Place 1 patch (4.6 mg total) onto the skin daily.   . TRULICITY 1.5 VO/1.6WV SOPN INJECT 1.5 MG INTO THE SKIN ONCE A WEEK.   . valsartan (DIOVAN) 320 MG tablet TAKE 1 TABLET BY MOUTH EVERY DAY    No facility-administered encounter medications on file as of 04/02/2020.     Objective:  Lab Results  Component Value Date   HGBA1C 7.5 (H) 03/24/2020   HGBA1C 8.1 (H) 12/23/2019   HGBA1C 10.5 (H) 09/19/2019   Lab Results  Component Value Date   MICROALBUR 80 01/22/2019   [place]  66 07/24/2019   CREATININE 1.56 (H) 03/24/2020   BP Readings from Last 3 Encounters:  03/31/20 120/70  03/24/20 124/78  03/24/20 (!) 142/86   Goals Addressed    . "He's eating too many sweets"       Spouse stated Current Barriers:  [name] Knowledge Deficits related to diabetes Meal planning using the plate method and portion control . Chronic Disease Management support and education needs related to CKDIII, DMII, HTN, memory loss  Nurse Case Manager Clinical Goal(s):  . 04/03/20 New  Over the next 90 days, spouse will report patient continues to modify his diet to recommended ADA diet and is taking his diabetic medications exactly as prescribed in order to better manage his DM and achieve  lowering his A1c <7.0 %  CCM RN CM Interventions:  04/03/20 call completed with spouse Dylan [name] . Evaluation of current treatment plan related to diabetes and patient's adherence to plan as established by provider . Reinforced education to patient/spouse Dylan [name] re: importance of adherence to following a low carb diet using Meal Planning with the plate method with portion control; reviewed patient's recent A1c has decreased to 7.5 % obtained on 03/24/20; Positive reinforcement given to spouse and patient for making efforts to lower his A1c; reinforced importance to work on lowering A1c with goal <7.0  . Reviewed and discussed patient's current GFR and ways to self improve renal function . Reviewed medications with patient and discussed patient is adhering to taking his weekly injection of Trulicity and patient is self injecting; with spouse Dylan [name] supervising dosage and administration . Reiterated to spouse Dylan [name], providing education and rationale, to check cbg daily before meals and record, calling the CCM team and or PCP for findings outside established parameters; FBS 80-130 and <180 after meals . Discussed plans with patient for ongoing care management follow up and provided patient with direct contact information for care management team . Mailed printed educational materials related to "Your Diabetes Care Schedule"; "Grocery Shopping: Eat Well with Diabetes"; "Diabetes and Kidney Disease"; "The Best Salt Substitutes for Kidney Patients"; "6 Ways to be Water Wise"  Patient Self Care Activities:  . Attends all scheduled provider appointments . Currently UNABLE TO independently perform self-care without assistance  . Supportive caregiver, spouse Dylan [name] to assist with caregiver needs  Please see past updates related to this goal by clicking on the "Past Updates" button in the selected goal      . "to evaluate and treat chronic persistent cough"       CARE PLAN ENTRY (see longitudinal plan of  care for additional care plan information)  Current Barriers:  [name] Knowledge Deficits related to evaluation and treatment of persistent cough  . Chronic Disease Management support and education needs related to Type II DM, non-insulin dependent, CKD, stage III, Essential hypertension . Cognitive Deficits . Parkinsonism . Risk for Aspiration   Nurse Case Manager Clinical Goal(s):  [name] Over the next 90 days, patient will work with established Neurologist and PCP to address needs related to evaluation and treatment of persistent cough and or aspiration.   CCM RN CM Interventions:  04/03/20 call completed spouse Dylan [name]  . Inter-disciplinary care team collaboration (see longitudinal plan of care) . Evaluation of current treatment plan related to persistent cough with risk for aspiration and patient's adherence to plan as established by provider . Determined patient followed up with Neurology last week, [name] NP; Reviewed and discussed a referral has been placed on 03/24/20 for speech pathology to evaluate swallowing/speech .  Determined the referral is still in "New Request" status; Discussed this CM RN will f/u with Neurology office regarding referral status and request to expedite the referral  . Re-iterated education to spouse re: risk for aspiration with Parkinsonism; Educated on s/s suggestive of aspiration; Educated on aspiration prevention tips and importance of reporting symptoms as soon as possible if they occur . Reviewed and discussed Neuro Assessment/Plan and medication increase for Carbidopa/Levodopa:  o 1.  Dementia with behavioral issues o 2.  Parkinsonism o -Has improved with Sinemet (walking, ability to lift legs, cognition) o -Fairly significant symmetric parkinsonian features on today's examination (rigidity, bradykinesia) o -Most consistent with central nervous system degenerative disorder, differential diagnosis including progressive supranuclear palsy o -CT head showed  generalized atrophy, supratentorium small vessel disease o -Continue Namenda 10 mg twice a day, Exelon patch o -Increase Sinemet 25/100 mg 1.5 tablets 3 times a day, taken the same time everyday o -Will refer to speech therapy for swallow evaluation, due to report of intermittent cough, concern for aspiration o -Decided to hold off with home OT for utensil training-they already have a lot going on for both of them o -Follow-up in 4 months or sooner if needed with Dr. Krista Blue . Discussed plans with patient for ongoing care management follow up and provided patient with direct contact information for care management team  Patient Self Care Activities:  . Attends all scheduled provider appointments . Currently UNABLE TO independently perform self-care without assistance  . Supportive caregiver, spouse Dylan [name] to assist with caregiver needs  Please see past updates related to this goal by clicking on the "Past Updates" button in the selected goal        Plan:   Follow up with provider re: Speech/Swallowing evaluation  04/06/20  [name], RN, BSN, CCM Care Management Coordinator [facility]  Direct Phone: [phone]

## 2020-04-03 NOTE — Patient Instructions (Signed)
Visit Information  Goals Addressed    . "He's eating too many sweets"       Spouse stated Current Barriers:  Dylan [name] Kitchen Knowledge Deficits related to diabetes Meal planning using the plate method and portion control . Chronic Disease Management support and education needs related to CKDIII, DMII, HTN, memory loss  Nurse Case Manager Clinical Goal(s):  . 04/03/20 New  Over the next 90 days, spouse will report patient continues to modify his diet to recommended ADA diet and is taking his diabetic medications exactly as prescribed in order to better manage his DM and achieve lowering his A1c <7.0 %  CCM RN CM Interventions:  04/03/20 call completed with spouse Dylan [name] . Evaluation of current treatment plan related to diabetes and patient's adherence to plan as established by provider . Reinforced education to patient/spouse Dylan [name] re: importance of adherence to following a low carb diet using Meal Planning with the plate method with portion control; reviewed patient's recent A1c has decreased to 7.5 % obtained on 03/24/20; Positive reinforcement given to spouse and patient for making efforts to lower his A1c; reinforced importance to work on lowering A1c with goal <7.0  . Reviewed and discussed patient's current GFR and ways to self improve renal function . Reviewed medications with patient and discussed patient is adhering to taking his weekly injection of Trulicity and patient is self injecting; with spouse Dylan [name] supervising dosage and administration . Reiterated to spouse Dylan [name], providing education and rationale, to check cbg daily before meals and record, calling the CCM team and or PCP for findings outside established parameters; FBS 80-130 and <180 after meals . Discussed plans with patient for ongoing care management follow up and provided patient with direct contact information for care management team . Mailed printed educational materials related to "Your Diabetes Care Schedule"; "Grocery  Shopping: Eat Well with Diabetes"; "Diabetes and Kidney Disease"; "The Best Salt Substitutes for Kidney Patients"; "6 Ways to be Water Wise"  Patient Self Care Activities:  . Attends all scheduled provider appointments . Currently UNABLE TO independently perform self-care without assistance  . Supportive caregiver, spouse Dylan [name] to assist with caregiver needs  Please see past updates related to this goal by clicking on the "Past Updates" button in the selected goal      . "to evaluate and treat chronic persistent cough"       CARE PLAN ENTRY (see longitudinal plan of care for additional care plan information)  Current Barriers:  Dylan [name] Kitchen Knowledge Deficits related to evaluation and treatment of persistent cough  . Chronic Disease Management support and education needs related to Type II DM, non-insulin dependent, CKD, stage III, Essential hypertension . Cognitive Deficits . Parkinsonism . Risk for Aspiration   Nurse Case Manager Clinical Goal(s):  Dylan [name] Kitchen Over the next 90 days, patient will work with established Neurologist and PCP to address needs related to evaluation and treatment of persistent cough and or aspiration.   CCM RN CM Interventions:  04/03/20 call completed spouse Dylan [name]  . Inter-disciplinary care team collaboration (see longitudinal plan of care) . Evaluation of current treatment plan related to persistent cough with risk for aspiration and patient's adherence to plan as established by provider . Determined patient followed up with Neurology last week, [name] NP; Reviewed and discussed a referral has been placed on 03/24/20 for speech pathology to evaluate swallowing/speech . Determined the referral is still in "New Request" status; Discussed this CM RN will f/u with Neurology office regarding referral status and request to expedite  the referral  . Re-iterated education to spouse re: risk for aspiration with Parkinsonism; Educated on s/s suggestive of aspiration; Educated on  aspiration prevention tips and importance of reporting symptoms as soon as possible if they occur . Reviewed and discussed Neuro Assessment/Plan and medication increase for Carbidopa/Levodopa:  o 1.  Dementia with behavioral issues o 2.  Parkinsonism o -Has improved with Sinemet (walking, ability to lift legs, cognition) o -Fairly significant symmetric parkinsonian features on today's examination (rigidity, bradykinesia) o -Most consistent with central nervous system degenerative disorder, differential diagnosis including progressive supranuclear palsy o -CT head showed generalized atrophy, supratentorium small vessel disease o -Continue Namenda 10 mg twice a day, Exelon patch o -Increase Sinemet 25/100 mg 1.5 tablets 3 times a day, taken the same time everyday o -Will refer to speech therapy for swallow evaluation, due to report of intermittent cough, concern for aspiration o -Decided to hold off with home OT for utensil training-they already have a lot going on for both of them o -Follow-up in 4 months or sooner if needed with Dr. Krista Blue . Discussed plans with patient for ongoing care management follow up and provided patient with direct contact information for care management team  Patient Self Care Activities:  . Attends all scheduled provider appointments . Currently UNABLE TO independently perform self-care without assistance  . Supportive caregiver, spouse Dylan [name] to assist with caregiver needs  Please see past updates related to this goal by clicking on the "Past Updates" button in the selected goal         Patient verbalizes understanding of instructions provided today.   Follow up with provider re: Speech/Swallowing evaluation   [name], RN, BSN, CCM Care Management Coordinator [facility]  Direct Phone: [phone]

## 2020-04-06 ENCOUNTER — Ambulatory Visit: Payer: Self-pay

## 2020-04-06 ENCOUNTER — Telehealth: Payer: Medicare HMO

## 2020-04-06 ENCOUNTER — Other Ambulatory Visit: Payer: Self-pay

## 2020-04-06 DIAGNOSIS — N183 Chronic kidney disease, stage 3 unspecified: Secondary | ICD-10-CM

## 2020-04-06 DIAGNOSIS — I10 Essential (primary) hypertension: Secondary | ICD-10-CM | POA: Diagnosis not present

## 2020-04-06 DIAGNOSIS — N1831 Chronic kidney disease, stage 3a: Secondary | ICD-10-CM

## 2020-04-06 DIAGNOSIS — F0391 Unspecified dementia with behavioral disturbance: Secondary | ICD-10-CM

## 2020-04-06 DIAGNOSIS — I1310 Hypertensive heart and chronic kidney disease without heart failure, with stage 1 through stage 4 chronic kidney disease, or unspecified chronic kidney disease: Secondary | ICD-10-CM

## 2020-04-06 DIAGNOSIS — E1122 Type 2 diabetes mellitus with diabetic chronic kidney disease: Secondary | ICD-10-CM

## 2020-04-06 DIAGNOSIS — E1121 Type 2 diabetes mellitus with diabetic nephropathy: Secondary | ICD-10-CM | POA: Diagnosis not present

## 2020-04-07 ENCOUNTER — Ambulatory Visit: Payer: Self-pay

## 2020-04-07 ENCOUNTER — Ambulatory Visit (INDEPENDENT_AMBULATORY_CARE_PROVIDER_SITE_OTHER): Payer: Medicare HMO

## 2020-04-07 ENCOUNTER — Telehealth: Payer: Medicare HMO

## 2020-04-07 ENCOUNTER — Telehealth: Payer: Self-pay | Admitting: Neurology

## 2020-04-07 VITALS — BP 118/72 | HR 51 | Temp 98.0°F | Ht 68.0 in | Wt 189.0 lb

## 2020-04-07 DIAGNOSIS — E538 Deficiency of other specified B group vitamins: Secondary | ICD-10-CM

## 2020-04-07 DIAGNOSIS — F0391 Unspecified dementia with behavioral disturbance: Secondary | ICD-10-CM

## 2020-04-07 DIAGNOSIS — N1831 Chronic kidney disease, stage 3a: Secondary | ICD-10-CM

## 2020-04-07 DIAGNOSIS — E1122 Type 2 diabetes mellitus with diabetic chronic kidney disease: Secondary | ICD-10-CM

## 2020-04-07 MED ORDER — CYANOCOBALAMIN 1000 MCG/ML IJ SOLN
1000.0000 ug | Freq: Once | INTRAMUSCULAR | Status: AC
  Administered 2020-04-07: 1000 ug via INTRAMUSCULAR

## 2020-04-07 NOTE — Chronic Care Management (AMB) (Signed)
Chronic Care Management   Follow Up Note   04/07/2020 Name: Dylan [name]. MRN: [id number] DOB: 05/11/1941  Referred by: [name], MD Reason for referral : Chronic Care Management (FU w/Neuro re: swallowing eval)   Dylan [name]. is a 79 y.o. year old male who is a primary care patient of [name], MD. The CCM team was consulted for assistance with chronic disease management and care coordination needs.    Review of patient status, including review of consultants reports, relevant laboratory and other test results, and collaboration with appropriate care team members and the patient's provider was performed as part of comprehensive patient evaluation and provision of chronic care management services.    SDOH (Social Determinants of Health) assessments performed: No See Care Plan activities for detailed interventions related to [facility])   Placed outbound call to [facility] to f/u on Speech Pathology referral.     Outpatient Encounter Medications as of 04/06/2020  Medication Sig Note  . allopurinol (ZYLOPRIM) 100 MG tablet Take 2 tablets (200 mg total) by mouth daily.   [name] amLODipine (NORVASC) 5 MG tablet TAKE 1 TABLET BY MOUTH DAILY   . Ascorbic Acid (VITAMIN C) 1000 MG tablet Take 1,000 mg by mouth daily.   [name] aspirin 81 MG tablet Take 81 mg by mouth 3 (three) times a week.    . carbidopa-levodopa (SINEMET IR) 25-100 MG tablet Take 1.5 tablets by mouth 3 (three) times daily.   . carvedilol (COREG) 25 MG tablet Take 1 tablet (25 mg total) by mouth 2 (two) times daily.   . Cholecalciferol (VITAMIN D3) 5000 units CAPS Take 5,000 Units by mouth every evening.   . Coenzyme Q10 200 MG capsule Take 200 mg by mouth daily.   [name] COLCRYS 0.6 MG tablet Take 1 tablet (0.6 mg total) by mouth daily.   . diclofenac Sodium (VOLTAREN) 1 % GEL Apply 2 g topically 3 (three) times daily.   [name] glucose blood (ONETOUCH VERIO) test strip Use as instructed to check blood sugars  daily dx: e11.22   . Lancet Devices (ONE TOUCH DELICA LANCING DEV) MISC Please fill ONE TOUCH DELICA LANCING DEVICE.  Use to test blood sugar twice daily as directed. E11.65   . Lancets MISC Please fill basic/generic push button lancets.  Patient to test blood sugar twice daily. DX: E11.65   . Melatonin 10 MG TABS Take 10 mg by mouth at bedtime.   . memantine (NAMENDA) 10 MG tablet Take 1 tablet (10 mg total) by mouth 2 (two) times daily. 12/05/2019: Patient is only taking 1 tablet in the am but is holding the pm dose due to having headaches  . metFORMIN (GLUCOPHAGE) 1000 MG tablet TAKE 1 TABLET BY MOUTH EVERY DAY   . pravastatin (PRAVACHOL) 20 MG tablet TAKE 1 TABLET BY ORAL ROUTE EVERY DAY   . rivastigmine (EXELON) 4.6 mg/24hr Place 1 patch (4.6 mg total) onto the skin daily.   . TRULICITY 1.5 EU/2.3NT SOPN INJECT 1.5 MG INTO THE SKIN ONCE A WEEK.   . valsartan (DIOVAN) 320 MG tablet TAKE 1 TABLET BY MOUTH EVERY DAY    No facility-administered encounter medications on file as of 04/06/2020.     Objective:   Lab Results  Component Value Date   HGBA1C 7.5 (H) 03/24/2020   HGBA1C 8.1 (H) 12/23/2019   HGBA1C 10.5 (H) 09/19/2019   Lab Results  Component Value Date   MICROALBUR 80 01/22/2019   LDLCALC 66 07/24/2019   CREATININE 1.56 (  H) 03/24/2020   BP Readings from Last 3 Encounters:  04/07/20 118/72  03/31/20 120/70  03/24/20 124/78   Goals Addressed    . "to evaluate and treat chronic persistent cough"       CARE PLAN ENTRY (see longitudinal plan of care for additional care plan information)  Current Barriers:  [name] Knowledge Deficits related to evaluation and treatment of persistent cough  . Chronic Disease Management support and education needs related to Type II DM, non-insulin dependent, CKD, stage III, Essential hypertension . Cognitive Deficits . Parkinsonism . Risk for Aspiration   Nurse Case Manager Clinical Goal(s):  [name] Over the next 90 days, patient will work with  established Neurologist and PCP to address needs related to evaluation and treatment of persistent cough and or aspiration.   CCM RN CM Interventions:  04/03/20 call completed spouse Dylan [name]  . Inter-disciplinary care team collaboration (see longitudinal plan of care) . Evaluation of current treatment plan related to persistent cough with risk for aspiration and patient's adherence to plan as established by provider . Determined patient followed up with Neurology last week, [name] NP; Reviewed and discussed a referral has been placed on 03/24/20 for speech pathology to evaluate swallowing/speech . Determined the referral is still in "New Request" status; Discussed this CM RN will f/u with Neurology office regarding referral status and request to expedite the referral  . Re-iterated education to spouse re: risk for aspiration with Parkinsonism; Educated on s/s suggestive of aspiration; Educated on aspiration prevention tips and importance of reporting symptoms as soon as possible if they occur . Reviewed and discussed Neuro Assessment/Plan and medication increase for Carbidopa/Levodopa:  o 1.  Dementia with behavioral issues o 2.  Parkinsonism o -Has improved with Sinemet (walking, ability to lift legs, cognition) o -Fairly significant symmetric parkinsonian features on today's examination (rigidity, bradykinesia) o -Most consistent with central nervous system degenerative disorder, differential diagnosis including progressive supranuclear palsy o -CT head showed generalized atrophy, supratentorium small vessel disease o -Continue Namenda 10 mg twice a day, Exelon patch o -Increase Sinemet 25/100 mg 1.5 tablets 3 times a day, taken the same time everyday o -Will refer to speech therapy for swallow evaluation, due to report of intermittent cough, concern for aspiration o -Decided to hold off with home OT for utensil training-they already have a lot going on for both of them o -Follow-up in 4  months or sooner if needed with Dr. Krista Blue . Discussed plans with patient for ongoing care management follow up and provided patient with direct contact information for care management team 04/07/20 Placed outbound call to the office of Dr. Krista Blue, Neurology . Spoke with Lattie Haw regarding the Speech Pathology referral placed by [name] NP . Advised the referral was placed on 03/24/20 and remains to be in "New Request" status . Determined [name] is the referral coordinator but is unavailable to take my call regarding the status of this referral . Provided my name/contact number and requested a return phone to f/u on this referral when possible, Lattie Haw will send her a message   Patient Self Care Activities:  . Attends all scheduled provider appointments . Currently UNABLE TO independently perform self-care without assistance  . Supportive caregiver, spouse Dylan [name] to assist with caregiver needs  Please see past updates related to this goal by clicking on the "Past Updates" button in the selected goal        Plan:   Follow up with provider re: Speech Pathology referral   Glenard Haring  Debbie Bellucci, RN, BSN, CCM Care Management Coordinator [place] [facility]  Direct Phone: [phone]

## 2020-04-07 NOTE — Telephone Encounter (Signed)
Nurse Case Manager Glenard Haring Litte(Dr [name] office) is asking for a call from Dylan [name] to discuss the speech referral for pt

## 2020-04-07 NOTE — Chronic Care Management (AMB) (Signed)
  Chronic Care Management   Outreach Note  04/07/2020 Name: Shant Hence. MRN: [id number] DOB: 09/16/1941  Referred by: [name], MD Reason for referral : Care Coordination   Second unsuccessful outbound call placed to the patients caregiver, [name] to assist with ongoing care management and care coordination needs. HIPAA compliant voice message requesting return call.  Follow Up Plan: Performed chart review to note patient caregiver actively engaged with RN Care Manager, Glenard Haring Little. Collaboration with RN to inform of SW plan to not proceed with a third outreach. SW requests RN Care Manager inform SW of future care coordination needs and/or to encourage the patient and his caregiver to contact SW as needed.  Daneen Schick, BSW, CDP Social Worker, Certified Dementia Practitioner [place] [facility] [facility] [phone]

## 2020-04-07 NOTE — Progress Notes (Signed)
Patient presents today for a vitamin B12 shot he is here for his second dose of four. YL,RMA

## 2020-04-08 NOTE — Telephone Encounter (Signed)
Called Angel back and left her message to call back . Thanks [name]

## 2020-04-09 ENCOUNTER — Other Ambulatory Visit: Payer: Self-pay

## 2020-04-09 ENCOUNTER — Ambulatory Visit: Payer: Self-pay

## 2020-04-09 ENCOUNTER — Telehealth: Payer: Medicare HMO

## 2020-04-09 DIAGNOSIS — F0391 Unspecified dementia with behavioral disturbance: Secondary | ICD-10-CM

## 2020-04-09 DIAGNOSIS — N183 Chronic kidney disease, stage 3 unspecified: Secondary | ICD-10-CM

## 2020-04-09 DIAGNOSIS — E1122 Type 2 diabetes mellitus with diabetic chronic kidney disease: Secondary | ICD-10-CM

## 2020-04-09 DIAGNOSIS — I10 Essential (primary) hypertension: Secondary | ICD-10-CM

## 2020-04-09 DIAGNOSIS — N1831 Chronic kidney disease, stage 3a: Secondary | ICD-10-CM

## 2020-04-13 ENCOUNTER — Telehealth: Payer: Medicare HMO

## 2020-04-13 ENCOUNTER — Ambulatory Visit: Payer: Self-pay

## 2020-04-13 ENCOUNTER — Other Ambulatory Visit: Payer: Self-pay

## 2020-04-13 DIAGNOSIS — N1831 Chronic kidney disease, stage 3a: Secondary | ICD-10-CM

## 2020-04-13 DIAGNOSIS — I10 Essential (primary) hypertension: Secondary | ICD-10-CM

## 2020-04-13 DIAGNOSIS — I1310 Hypertensive heart and chronic kidney disease without heart failure, with stage 1 through stage 4 chronic kidney disease, or unspecified chronic kidney disease: Secondary | ICD-10-CM

## 2020-04-13 DIAGNOSIS — N183 Chronic kidney disease, stage 3 unspecified: Secondary | ICD-10-CM

## 2020-04-13 DIAGNOSIS — F0391 Unspecified dementia with behavioral disturbance: Secondary | ICD-10-CM

## 2020-04-13 NOTE — Patient Instructions (Signed)
Visit Information  Goals Addressed            This Visit's Progress   . "He's eating too many sweets"   On track    Spouse stated Current Barriers:  [name] Knowledge Deficits related to diabetes Meal planning using the plate method and portion control . Chronic Disease Management support and education needs related to CKDIII, DMII, HTN, memory loss  Nurse Case Manager Clinical Goal(s):  . 04/03/20 New  Over the next 90 days, spouse will report patient continues to modify his diet to recommended ADA diet and is taking his diabetic medications exactly as prescribed in order to better manage his DM and achieve lowering his A1c <7.0 %  CCM RN CM Interventions:  04/09/20 call completed with spouse [name] . Evaluation of current treatment plan related to diabetes and patient's adherence to plan as established by provider . Reinforced education to patient/spouse [name] re: importance of adherence to following a low carb diet using Meal Planning with the plate method with portion control; reviewed patient's recent A1c has decreased to 7.5 % obtained on 03/24/20; Positive reinforcement given to spouse and patient for making efforts to lower his A1c; reinforced importance to work on lowering A1c with goal <7.0  . Reviewed and discussed patient's current GFR and ways to self improve renal function . Reviewed medications with patient and discussed patient is adhering to taking his weekly injection of Trulicity and patient is self injecting; with spouse [name] supervising dosage and administration . Reiterated to spouse [name], providing education and rationale, to check cbg daily before meals and record, calling the CCM team and or PCP for findings outside established parameters; FBS 80-130 and <180 after meals . Discussed plans with patient for ongoing care management follow up and provided patient with direct contact information for care management team  Patient Self Care Activities:  . Attends all  scheduled provider appointments . Currently UNABLE TO independently perform self-care without assistance  . Supportive caregiver, spouse [name] to assist with caregiver needs  Please see past updates related to this goal by clicking on the "Past Updates" button in the selected goal      . COMPLETED: "He's having more urinary incontinence"       Spouse stated Current Barriers:  [name] Knowledge Deficits related to diagnosis and treatment of urinary incontinence  Nurse Case Manager Clinical Goal(s):  [name] Over the next 60 days, patient will work with PCP to address needs related to urinary incontinence  CCM RN CM Interventions:  04/09/20 call completed with spouse [name] . Evaluation of current treatment plan related to Urinary incontinence and patient's adherence to plan as established by provider. . Discussed patient's urinary status is stable at this time with no worsening symptoms of incontinence . Determined spouse denies issues with skin breakdown and patient is able to provide perineal care at this time  . Education provided to spouse related this condition may worsen as a result of Parkinsonism as the condition progresses and she should keep Mr. Onorato health care team well informed . Discussed plans with patient for ongoing care management follow up and provided patient with direct contact information for care management team  Patient Self Care Activities:  . Unable to independently perform Self Care  Please see past updates related to this goal by clicking on the "Past Updates" button in the selected goal      . "His balance is much worse and he needs a walker"   On track  CARE PLAN ENTRY (see longitudinal plan of care for additional care plan information)  Spouse states Current Barriers:  . Impaired gait/balance . High Risk for Falls . 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 . Newly diagnosed; Mild chronic  microvascular ischemic changes  Nurse Case Manager Clinical Goal(s):  [name] Over the next 30 days, patient will work with the CCM team to address needs related to impaired gait disturbance and high risk for falls Goal Met  . 08/02/19 Over the next 30 days, patient will complete outpatient PT and will be able to adhere to his prescribed HEP Goal Partially Met . New 03/12/20 Over the next 90 days, caregiver will confirm patient is using his DME as directed by MD and PCP to assist with mobility and gait AS EVIDENCE BY patient will experience improved mobility w/o falls or injury related to falls  CCM RN CM Interventions:  04/09/20 call completed with spouse [name]  . Inter-disciplinary care team collaboration (see longitudinal plan of care) . Evaluation of current treatment plan related to Impaired Physical Mobility and patient's adherence to plan as established by provider. . Determined Mr. Woodbury is having difficulty getting in and out of his bed, spouse is concerned he is at fall risk while attempting this and she would like to request an order for a semi-electric hospital bed . Discussed [name] will clear his room and make some arrangements to have the bed placed in his current bedroom and will notify this RN CM when ready to proceed with the order for the bed  . Discussed plans with patient for ongoing care management follow up and provided patient with direct contact information for care management team  Patient Self Care Activities:  . Attends all scheduled provider appointments . Currently UNABLE TO independently perform self-care without assistance  . Supportive caregiver, spouse [name] to assist with caregiver needs  Please see past updates related to this goal by clicking on the "Past Updates" button in the selected goal      . "I don't know what the next steps are for Mr. Wenner Dementia"   On track    Spouse stated  Current Barriers:  [name] Knowledge Deficits related to next  steps for diagnosis and treatment for new diagnosis of Dementia . Chronic Disease Management support and education needs related to DMII, Essential HTN, CKDIII, Dementia with behavior changes  Nurse Case Manager Clinical Goal(s):  [name] Over the next 14 days, spouse will collaborative with the Neuro office and receive instructions regarding brain imaging ordered for determination of type of Dementia - Goal Met  . Over the next 90 days, patient will verbalize understanding of plan for treatment management for new dx: Dementia  Goal Met . 01/16/20 New Over the next 14 days, patient will complete a follow up visit with Dr. Krista Blue, neurologist and spouse will report all new or worsening symptoms related to cognitive and behavior changes Goal Met  . 01/16/20 New Over the next 30 days, patient and spouse will verbalize having a good understanding of the treatment plan recommended by Dr. Krista Blue and will adhere to her recommendations Goal Met . 01/16/20 New Over the next 90 days, spouse will be able to provide the care needed to patient without difficulty or caregiver burnout AEB spouse will report feeling satisfied with the care she is able to provide Mr. Banas and will verbalize having all resources needed  CCM RN CM Interventions:  03/12/20: call completed with  spouse [name] . 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 Determined patient is adhering to his prescribed treatment plan with the assistance of his spouse [name]  . Determined spouse believes patient's symptoms of rigidity, tremors and slurred speech have improved since staring the new therapy . Reinforced the importance of patient taking this medication exactly as prescribed w/o missed or late doses for best effectiveness . Educated on potential wearing off effects and discussed this should be reported promptly if noticed  . Discussed patient's next f/u with [facility] is scheduled for 03/24/20  @ 1:15 PM to evaluate effectiveness of Carbidopa/Levodopa  . Discussed plans with [name] for ongoing care management follow up and confirmed she has the direct contact information for care management team  Patient Self Care Activities  . Attends all scheduled provider appointments . Currently UNABLE TO independently perform self-care without assistance  . Supportive caregiver, spouse [name] to assist with caregiver needs  Please see past updates related to this goal by clicking on the "Past Updates" button in the selected goal      . "to evaluate and treat chronic persistent cough"   On track    [place] (see longitudinal plan of care for additional care plan information)  Current Barriers:  [name] Knowledge Deficits related to evaluation and treatment of persistent cough  . Chronic Disease Management support and education needs related to Type II DM, non-insulin dependent, CKD, stage III, Essential hypertension . Cognitive Deficits . Parkinsonism . Risk for Aspiration   Nurse Case Manager Clinical Goal(s):  [name] Over the next 90 days, patient will work with established Neurologist and PCP to address needs related to evaluation and treatment of persistent cough and or aspiration.   CCM RN CM Interventions:  04/09/20 call completed spouse [name]  . Inter-disciplinary care team collaboration (see longitudinal plan of care) . Evaluation of current treatment plan related to persistent cough with risk for aspiration and patient's adherence to plan as established by provider . Determined patient followed up with Neurology last week, [name] NP; Reviewed and discussed a referral has been placed on 03/24/20 for speech pathology to evaluate swallowing/speech . Determined the referral is still in "New Request" status; Discussed this CM RN will f/u with Neurology office regarding referral status and request to expedite the referral  . Re-iterated education to spouse re: risk for aspiration  with Parkinsonism; Educated on s/s suggestive of aspiration; Educated on aspiration prevention tips and importance of reporting symptoms as soon as possible if they occur . Reviewed and discussed Neuro Assessment/Plan and medication increase for Carbidopa/Levodopa:  o 1.  Dementia with behavioral issues o 2.  Parkinsonism o -Has improved with Sinemet (walking, ability to lift legs, cognition) o -Fairly significant symmetric parkinsonian features on today's examination (rigidity, bradykinesia) o -Most consistent with central nervous system degenerative disorder, differential diagnosis including progressive supranuclear palsy o -CT head showed generalized atrophy, supratentorium small vessel disease o -Continue Namenda 10 mg twice a day, Exelon patch o -Increase Sinemet 25/100 mg 1.5 tablets 3 times a day, taken the same time everyday o -Will refer to speech therapy for swallow evaluation, due to report of intermittent cough, concern for aspiration o -Decided to hold off with home OT for utensil training-they already have a lot going on for both of them o -Follow-up in 4 months or sooner if needed with Dr. Krista Blue . Discussed plans with patient for ongoing care management follow up and provided  patient with direct contact information for care management team 04/07/20 Placed outbound call to the office of Dr. Krista Blue, Neurology . Spoke with Lattie Haw regarding the Speech Pathology referral placed by [name] NP . Advised the referral was placed on 03/24/20 and remains to be in "New Request" status . Determined [name] is the referral coordinator but is unavailable to take my call regarding the status of this referral . Provided my name/contact number and requested a return phone to f/u on this referral when possible, Lattie Haw will send her a message  04/09/20 Return call from [place] with Dr. Rhea Belton office, she is returning the call regarding Mr. Dyar Speech referral   Patient Self Care Activities:  . Attends all  scheduled provider appointments . Currently UNABLE TO independently perform self-care without assistance  . Supportive caregiver, spouse [name] to assist with caregiver needs  Please see past updates related to this goal by clicking on the "Past Updates" button in the selected goal        Patient verbalizes understanding of instructions provided today.   Telephone follow up appointment with care management team member scheduled for: 05/18/20  [name], RN, BSN, CCM Care Management Coordinator [place] [facility]  Direct Phone: [phone]

## 2020-04-13 NOTE — Chronic Care Management (AMB) (Signed)
Chronic Care Management   Follow Up Note   04/09/2020 Name: Becker Christopher. MRN: [id number] DOB: 1941/03/05  Referred by: [name], MD Reason for referral : Chronic Care Management (FU RN CM Call to Neuro re: Speech Pathology)   [name] is a 79 y.o. year old male who is a primary care patient of [name], MD. The CCM team was consulted for assistance with chronic disease management and care coordination needs.    Review of patient status, including review of consultants reports, relevant laboratory and other test results, and collaboration with appropriate care team members and the patient's provider was performed as part of comprehensive patient evaluation and provision of chronic care management services.    SDOH (Social Determinants of Health) assessments performed: Yes - no acute needs  See Care Plan activities for detailed interventions related to [place])    Placed outbound CCM RN CM follow up call to spouse [name] to discuss her concerns about Mr. Doberstein fall risk when getting in and out of his bed.   Outpatient Encounter Medications as of 04/09/2020  Medication Sig Note  . allopurinol (ZYLOPRIM) 100 MG tablet Take 2 tablets (200 mg total) by mouth daily.   [name] amLODipine (NORVASC) 5 MG tablet TAKE 1 TABLET BY MOUTH DAILY   . Ascorbic Acid (VITAMIN C) 1000 MG tablet Take 1,000 mg by mouth daily.   [name] aspirin 81 MG tablet Take 81 mg by mouth 3 (three) times a week.    . carbidopa-levodopa (SINEMET IR) 25-100 MG tablet Take 1.5 tablets by mouth 3 (three) times daily.   . carvedilol (COREG) 25 MG tablet Take 1 tablet (25 mg total) by mouth 2 (two) times daily.   . Cholecalciferol (VITAMIN D3) 5000 units CAPS Take 5,000 Units by mouth every evening.   . Coenzyme Q10 200 MG capsule Take 200 mg by mouth daily.   [name] COLCRYS 0.6 MG tablet Take 1 tablet (0.6 mg total) by mouth daily.   . diclofenac Sodium (VOLTAREN) 1 % GEL Apply 2 g topically 3 (three) times daily.    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 Lancet Devices (ONE TOUCH DELICA LANCING DEV) MISC Please fill ONE TOUCH DELICA LANCING DEVICE.  Use to test blood sugar twice daily as directed. E11.65   . Lancets MISC Please fill basic/generic push button lancets.  Patient to test blood sugar twice daily. DX: E11.65   . Melatonin 10 MG TABS Take 10 mg by mouth at bedtime.   . memantine (NAMENDA) 10 MG tablet Take 1 tablet (10 mg total) by mouth 2 (two) times daily. 12/05/2019: Patient is only taking 1 tablet in the am but is holding the pm dose due to having headaches  . metFORMIN (GLUCOPHAGE) 1000 MG tablet TAKE 1 TABLET BY MOUTH EVERY DAY   . pravastatin (PRAVACHOL) 20 MG tablet TAKE 1 TABLET BY ORAL ROUTE EVERY DAY   . rivastigmine (EXELON) 4.6 mg/24hr Place 1 patch (4.6 mg total) onto the skin daily.   . TRULICITY 1.5 BV/6.7OL SOPN INJECT 1.5 MG INTO THE SKIN ONCE A WEEK.   . valsartan (DIOVAN) 320 MG tablet TAKE 1 TABLET BY MOUTH EVERY DAY    No facility-administered encounter medications on file as of 04/09/2020.     Objective:  Lab Results  Component Value Date   HGBA1C 7.5 (H) 03/24/2020   HGBA1C 8.1 (H) 12/23/2019   HGBA1C 10.5 (H) 09/19/2019  Lab Results  Component Value Date   MICROALBUR 80 01/22/2019   LDLCALC 66 07/24/2019   CREATININE 1.56 (H) 03/24/2020   BP Readings from Last 3 Encounters:  04/07/20 118/72  03/31/20 120/70  03/24/20 124/78    Goals Addressed    . "He's eating too many sweets"   On track    Spouse stated Current Barriers:  [name] Knowledge Deficits related to diabetes Meal planning using the plate method and portion control . Chronic Disease Management support and education needs related to CKDIII, DMII, HTN, memory loss  Nurse Case Manager Clinical Goal(s):  . 04/03/20 New  Over the next 90 days, spouse will report patient continues to modify his diet to recommended ADA diet and is taking his  diabetic medications exactly as prescribed in order to better manage his DM and achieve lowering his A1c <7.0 %  CCM RN CM Interventions:  04/09/20 call completed with spouse [name] . Evaluation of current treatment plan related to diabetes and patient's adherence to plan as established by provider . Reinforced education to patient/spouse [name] re: importance of adherence to following a low carb diet using Meal Planning with the plate method with portion control; reviewed patient's recent A1c has decreased to 7.5 % obtained on 03/24/20; Positive reinforcement given to spouse and patient for making efforts to lower his A1c; reinforced importance to work on lowering A1c with goal <7.0  . Reviewed and discussed patient's current GFR and ways to self improve renal function . Reviewed medications with patient and discussed patient is adhering to taking his weekly injection of Trulicity and patient is self injecting; with spouse [name] supervising dosage and administration . Reiterated to spouse [name], providing education and rationale, to check cbg daily before meals and record, calling the CCM team and or PCP for findings outside established parameters; FBS 80-130 and <180 after meals . Discussed plans with patient for ongoing care management follow up and provided patient with direct contact information for care management team  Patient Self Care Activities:  . Attends all scheduled provider appointments . Currently UNABLE TO independently perform self-care without assistance  . Supportive caregiver, spouse [name] to assist with caregiver needs  Please see past updates related to this goal by clicking on the "Past Updates" button in the selected goal      . COMPLETED: "He's having more urinary incontinence"       Spouse stated Current Barriers:  [name] Knowledge Deficits related to diagnosis and treatment of urinary incontinence  Nurse Case Manager Clinical Goal(s):  [name] Over the next 60 days,  patient will work with PCP to address needs related to urinary incontinence  CCM RN CM Interventions:  04/09/20 call completed with spouse [name] . Evaluation of current treatment plan related to Urinary incontinence and patient's adherence to plan as established by provider. . Discussed patient's urinary status is stable at this time with no worsening symptoms of incontinence . Determined spouse denies issues with skin breakdown and patient is able to provide perineal care at this time  . Education provided to spouse related this condition may worsen as a result of Parkinsonism as the condition progresses and she should keep Mr. Norgaard health care team well informed . Discussed plans with patient for ongoing care management follow up and provided patient with direct contact information for care management team  Patient Self Care Activities:  . Unable to independently perform Self Care  Please see past updates related to this goal by clicking on the "Past Updates"  button in the selected goal      . "His balance is much worse and he needs a walker"   On track    [place] (see longitudinal plan of care for additional care plan information)  Spouse states Current Barriers:  . Impaired gait/balance . High Risk for Falls . 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 . Newly diagnosed; Mild chronic microvascular ischemic changes  Nurse Case Manager Clinical Goal(s):  [name] Over the next 30 days, patient will work with the CCM team to address needs related to impaired gait disturbance and high risk for falls Goal Met  . 08/02/19 Over the next 30 days, patient will complete outpatient PT and will be able to adhere to his prescribed HEP Goal Partially Met . New 03/12/20 Over the next 90 days, caregiver will confirm patient is using his DME as directed by MD and PCP to assist with mobility and gait AS EVIDENCE BY patient will experience  improved mobility w/o falls or injury related to falls  CCM RN CM Interventions:  04/09/20 call completed with spouse [name]  . Inter-disciplinary care team collaboration (see longitudinal plan of care) . Evaluation of current treatment plan related to Impaired Physical Mobility and patient's adherence to plan as established by provider. . Determined Mr. Desroches is having difficulty getting in and out of his bed, spouse is concerned he is at fall risk while attempting this and she would like to request an order for a semi-electric hospital bed . Discussed [name] will clear his room and make some arrangements to have the bed placed in his current bedroom and will notify this RN CM when ready to proceed with the order for the bed  . Discussed plans with patient for ongoing care management follow up and provided patient with direct contact information for care management team  Patient Self Care Activities:  . Attends all scheduled provider appointments . Currently UNABLE TO independently perform self-care without assistance  . Supportive caregiver, spouse [name] to assist with caregiver needs  Please see past updates related to this goal by clicking on the "Past Updates" button in the selected goal      . "I don't know what the next steps are for Mr. Kudrna Dementia"   On track    Spouse stated  Current Barriers:  [name] Knowledge Deficits related to next steps for diagnosis and treatment for new diagnosis of Dementia . Chronic Disease Management support and education needs related to DMII, Essential HTN, CKDIII, Dementia with behavior changes  Nurse Case Manager Clinical Goal(s):  [name] Over the next 14 days, spouse will collaborative with the Neuro office and receive instructions regarding brain imaging ordered for determination of type of Dementia - Goal Met  . Over the next 90 days, patient will verbalize understanding of plan for treatment management for new dx: Dementia  Goal  Met . 01/16/20 New Over the next 14 days, patient will complete a follow up visit with Dr. Krista Blue, neurologist and spouse will report all new or worsening symptoms related to cognitive and behavior changes Goal Met  . 01/16/20 New Over the next 30 days, patient and spouse will verbalize having a good understanding of the treatment plan recommended by Dr. Krista Blue and will adhere to her recommendations Goal Met . 01/16/20 New Over the next 90 days, spouse will be able to provide the care needed to patient without difficulty or caregiver burnout AEB spouse will report feeling  satisfied with the care she is able to provide Mr. Knappenberger and will verbalize having all resources needed  CCM RN CM Interventions:  03/12/20: call completed with spouse [name] . 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 Determined patient is adhering to his prescribed treatment plan with the assistance of his spouse [name]  . Determined spouse believes patient's symptoms of rigidity, tremors and slurred speech have improved since staring the new therapy . Reinforced the importance of patient taking this medication exactly as prescribed w/o missed or late doses for best effectiveness . Educated on potential wearing off effects and discussed this should be reported promptly if noticed  . Discussed patient's next f/u with [facility] is scheduled for 03/24/20 @ 1:15 PM to evaluate effectiveness of Carbidopa/Levodopa  . Discussed plans with [name] for ongoing care management follow up and confirmed she has the direct contact information for care management team  Patient Self Care Activities  . Attends all scheduled provider appointments . Currently UNABLE TO independently perform self-care without assistance  . Supportive caregiver, spouse [name] to assist with caregiver needs  Please see past updates related to this goal by clicking on the "Past Updates" button in the  selected goal      . "to evaluate and treat chronic persistent cough"   On track    [place] (see longitudinal plan of care for additional care plan information)  Current Barriers:  [name] Knowledge Deficits related to evaluation and treatment of persistent cough  . Chronic Disease Management support and education needs related to Type II DM, non-insulin dependent, CKD, stage III, Essential hypertension . Cognitive Deficits . Parkinsonism . Risk for Aspiration   Nurse Case Manager Clinical Goal(s):  [name] Over the next 90 days, patient will work with established Neurologist and PCP to address needs related to evaluation and treatment of persistent cough and or aspiration.   CCM RN CM Interventions:  04/09/20 call completed spouse [name]  . Inter-disciplinary care team collaboration (see longitudinal plan of care) . Evaluation of current treatment plan related to persistent cough with risk for aspiration and patient's adherence to plan as established by provider . Determined patient followed up with Neurology last week, [name] NP; Reviewed and discussed a referral has been placed on 03/24/20 for speech pathology to evaluate swallowing/speech . Determined the referral is still in "New Request" status; Discussed this CM RN will f/u with Neurology office regarding referral status and request to expedite the referral  . Re-iterated education to spouse re: risk for aspiration with Parkinsonism; Educated on s/s suggestive of aspiration; Educated on aspiration prevention tips and importance of reporting symptoms as soon as possible if they occur . Reviewed and discussed Neuro Assessment/Plan and medication increase for Carbidopa/Levodopa:  o 1.  Dementia with behavioral issues o 2.  Parkinsonism o -Has improved with Sinemet (walking, ability to lift legs, cognition) o -Fairly significant symmetric parkinsonian features on today's examination (rigidity, bradykinesia) o -Most consistent with  central nervous system degenerative disorder, differential diagnosis including progressive supranuclear palsy o -CT head showed generalized atrophy, supratentorium small vessel disease o -Continue Namenda 10 mg twice a day, Exelon patch o -Increase Sinemet 25/100 mg 1.5 tablets 3 times a day, taken the same time everyday o -Will refer to speech therapy for swallow evaluation, due to report of intermittent cough, concern for aspiration o -Decided to hold off with home OT for utensil training-they already have a lot going on for  both of them o -Follow-up in 4 months or sooner if needed with Dr. Krista Blue . Discussed plans with patient for ongoing care management follow up and provided patient with direct contact information for care management team 04/07/20 Placed outbound call to the office of Dr. Krista Blue, Neurology . Spoke with Lattie Haw regarding the Speech Pathology referral placed by [name] NP . Advised the referral was placed on 03/24/20 and remains to be in "New Request" status . Determined [name] is the referral coordinator but is unavailable to take my call regarding the status of this referral . Provided my name/contact number and requested a return phone to f/u on this referral when possible, Lattie Haw will send her a message  04/09/20 Return call from [place] with Dr. Rhea Belton office, she is returning the call regarding Mr. Doolittle Speech referral   Patient Self Care Activities:  . Attends all scheduled provider appointments . Currently UNABLE TO independently perform self-care without assistance  . Supportive caregiver, spouse [name] to assist with caregiver needs  Please see past updates related to this goal by clicking on the "Past Updates" button in the selected goal        Plan:   Telephone follow up appointment with care management team member scheduled for: 05/18/20  [name], RN, BSN, CCM Care Management Coordinator [place] [facility]  Direct Phone:  [phone]

## 2020-04-14 ENCOUNTER — Ambulatory Visit: Payer: Medicare HMO

## 2020-04-14 NOTE — Telephone Encounter (Signed)
Called Dylan [name] back and left her another message to call me back. Thanks [name]

## 2020-04-14 NOTE — Chronic Care Management (AMB) (Signed)
Chronic Care Management   Follow Up Note   04/14/2020 Name: Dylan [name]. MRN: [id number] DOB: 1941/10/28  Referred by: Dylan Chard, MD Reason for referral : Chronic Care Management (FU RN CM call to Dylan [name], Neuro re: Speech)   Dylan [name]. is a 79 y.o. year old male who is a primary care patient of Dylan Chard, MD. The CCM team was consulted for assistance with chronic disease management and care coordination needs.    Review of patient status, including review of consultants reports, relevant laboratory and other test results, and collaboration with appropriate care team members and the patient's provider was performed as part of comprehensive patient evaluation and provision of chronic care management services.    SDOH (Social Determinants of Health) assessments performed: No See Care Plan activities for detailed interventions related to [facility])  Received voice message from Dylan [name] with [facility] returning my call regarding patient's referral for Speech Pathology.      Outpatient Encounter Medications as of 04/13/2020  Medication Sig Note  . allopurinol (ZYLOPRIM) 100 MG tablet Take 2 tablets (200 mg total) by mouth daily.   [name] amLODipine (NORVASC) 5 MG tablet TAKE 1 TABLET BY MOUTH DAILY   . Ascorbic Acid (VITAMIN C) 1000 MG tablet Take 1,000 mg by mouth daily.   [name] aspirin 81 MG tablet Take 81 mg by mouth 3 (three) times a week.    . carbidopa-levodopa (SINEMET IR) 25-100 MG tablet Take 1.5 tablets by mouth 3 (three) times daily.   . carvedilol (COREG) 25 MG tablet Take 1 tablet (25 mg total) by mouth 2 (two) times daily.   . Cholecalciferol (VITAMIN D3) 5000 units CAPS Take 5,000 Units by mouth every evening.   . Coenzyme Q10 200 MG capsule Take 200 mg by mouth daily.   [name] COLCRYS 0.6 MG tablet Take 1 tablet (0.6 mg total) by mouth daily.   . diclofenac Sodium (VOLTAREN) 1 % GEL Apply 2 g topically 3 (three) times daily.   [name] glucose blood (ONETOUCH VERIO) test strip Use as  instructed to check blood sugars daily dx: e11.22   . Lancet Devices (ONE TOUCH DELICA LANCING DEV) MISC Please fill ONE TOUCH DELICA LANCING DEVICE.  Use to test blood sugar twice daily as directed. E11.65   . Lancets MISC Please fill basic/generic push button lancets.  Patient to test blood sugar twice daily. DX: E11.65   . Melatonin 10 MG TABS Take 10 mg by mouth at bedtime.   . memantine (NAMENDA) 10 MG tablet Take 1 tablet (10 mg total) by mouth 2 (two) times daily. 12/05/2019: Patient is only taking 1 tablet in the am but is holding the pm dose due to having headaches  . metFORMIN (GLUCOPHAGE) 1000 MG tablet TAKE 1 TABLET BY MOUTH EVERY DAY   . pravastatin (PRAVACHOL) 20 MG tablet TAKE 1 TABLET BY ORAL ROUTE EVERY DAY   . rivastigmine (EXELON) 4.6 mg/24hr Place 1 patch (4.6 mg total) onto the skin daily.   . TRULICITY 1.5 YP/9.5KD SOPN INJECT 1.5 MG INTO THE SKIN ONCE A WEEK.   . valsartan (DIOVAN) 320 MG tablet TAKE 1 TABLET BY MOUTH EVERY DAY    No facility-administered encounter medications on file as of 04/13/2020.     Objective:  Lab Results  Component Value Date   HGBA1C 7.5 (H) 03/24/2020   HGBA1C 8.1 (H) 12/23/2019   HGBA1C 10.5 (H) 09/19/2019   Lab Results  Component Value Date   MICROALBUR 80 01/22/2019   Dylan [name]  66 07/24/2019   CREATININE 1.56 (H) 03/24/2020   BP Readings from Last 3 Encounters:  04/07/20 118/72  03/31/20 120/70  03/24/20 124/78    Goals Addressed    . "to evaluate and treat chronic persistent cough"       CARE PLAN ENTRY (see longitudinal plan of care for additional care plan information)  Current Barriers:  [name] Knowledge Deficits related to evaluation and treatment of persistent cough  . Chronic Disease Management support and education needs related to Type II DM, non-insulin dependent, CKD, stage III, Essential hypertension . Cognitive Deficits . Parkinsonism . Risk for Aspiration   Nurse Case Manager Clinical Goal(s):  [name] Over the next 90  days, patient will work with established Neurologist and PCP to address needs related to evaluation and treatment of persistent cough and or aspiration.   CCM RN CM Interventions:  04/09/20 call completed spouse Dylan [name]  . Inter-disciplinary care team collaboration (see longitudinal plan of care) . Evaluation of current treatment plan related to persistent cough with risk for aspiration and patient's adherence to plan as established by provider . Determined patient followed up with Neurology last week, Dylan Denmark NP; Reviewed and discussed a referral has been placed on 03/24/20 for speech pathology to evaluate swallowing/speech . Determined the referral is still in "New Request" status; Discussed this CM RN will f/u with Neurology office regarding referral status and request to expedite the referral  . Re-iterated education to spouse re: risk for aspiration with Parkinsonism; Educated on s/s suggestive of aspiration; Educated on aspiration prevention tips and importance of reporting symptoms as soon as possible if they occur . Reviewed and discussed Neuro Assessment/Plan and medication increase for Carbidopa/Levodopa:  o 1.  Dementia with behavioral issues o 2.  Parkinsonism o -Has improved with Sinemet (walking, ability to lift legs, cognition) o -Fairly significant symmetric parkinsonian features on today's examination (rigidity, bradykinesia) o -Most consistent with central nervous system degenerative disorder, differential diagnosis including progressive supranuclear palsy o -CT head showed generalized atrophy, supratentorium small vessel disease o -Continue Namenda 10 mg twice a day, Exelon patch o -Increase Sinemet 25/100 mg 1.5 tablets 3 times a day, taken the same time everyday o -Will refer to speech therapy for swallow evaluation, due to report of intermittent cough, concern for aspiration o -Decided to hold off with home OT for utensil training-they already have a lot going on for both  of them o -Follow-up in 4 months or sooner if needed with Dr. Krista Blue . Discussed plans with patient for ongoing care management follow up and provided patient with direct contact information for care management team 04/07/20 Placed outbound call to the office of Dr. Krista Blue, Neurology . Spoke with Lattie Haw regarding the Speech Pathology referral placed by Dylan Denmark NP . Advised the referral was placed on 03/24/20 and remains to be in "New Request" status . Determined Dylan [name] is the referral coordinator but is unavailable to take my call regarding the status of this referral . Provided my name/contact number and requested a return phone to f/u on this referral when possible, Lattie Haw will send her a message  04/13/20 Return call from [place] with Dr. Rhea Belton office, she is returning the call regarding Mr. Marohl Speech referral   Patient Self Care Activities:  . Attends all scheduled provider appointments . Currently UNABLE TO independently perform self-care without assistance  . Supportive caregiver, spouse Dylan [name] to assist with caregiver needs  Please see past updates related to this goal by clicking on the "Past  Updates" button in the selected goal         Plan:   Follow up with provider re: referral for Speech Pathology  [name], RN, BSN, CCM Care Management Coordinator [place] [facility]  Direct Phone: [phone]

## 2020-04-15 ENCOUNTER — Telehealth: Payer: Medicare HMO

## 2020-04-15 ENCOUNTER — Ambulatory Visit: Payer: Self-pay

## 2020-04-15 ENCOUNTER — Other Ambulatory Visit: Payer: Self-pay

## 2020-04-15 ENCOUNTER — Ambulatory Visit (INDEPENDENT_AMBULATORY_CARE_PROVIDER_SITE_OTHER): Payer: Medicare HMO

## 2020-04-15 VITALS — BP 126/78 | HR 77 | Temp 97.7°F | Ht 68.0 in | Wt 189.0 lb

## 2020-04-15 DIAGNOSIS — I10 Essential (primary) hypertension: Secondary | ICD-10-CM

## 2020-04-15 DIAGNOSIS — N1831 Chronic kidney disease, stage 3a: Secondary | ICD-10-CM

## 2020-04-15 DIAGNOSIS — E538 Deficiency of other specified B group vitamins: Secondary | ICD-10-CM | POA: Diagnosis not present

## 2020-04-15 DIAGNOSIS — N183 Chronic kidney disease, stage 3 unspecified: Secondary | ICD-10-CM

## 2020-04-15 DIAGNOSIS — F0391 Unspecified dementia with behavioral disturbance: Secondary | ICD-10-CM

## 2020-04-15 MED ORDER — CYANOCOBALAMIN 1000 MCG/ML IJ SOLN
1000.0000 ug | Freq: Once | INTRAMUSCULAR | Status: AC
  Administered 2020-04-15: 1000 ug via INTRAMUSCULAR

## 2020-04-15 NOTE — Progress Notes (Signed)
Pt presents today for b12 injection. 

## 2020-04-15 NOTE — Chronic Care Management (AMB) (Signed)
Chronic Care Management   Follow Up Note   04/14/2020 Name: Dylan [name]. MRN: [id number] DOB: [birth year]  Referred by: [name], MD Reason for referral : Chronic Care Management (FU RN Call )   Dylan Ina. is a 79 y.o. year old male who is a primary care patient of [name], MD. The CCM team was consulted for assistance with chronic disease management and care coordination needs.    Review of patient status, including review of consultants reports, relevant laboratory and other test results, and collaboration with appropriate care team members and the patient's provider was performed as part of comprehensive patient evaluation and provision of chronic care management services.    SDOH (Social Determinants of Health) assessments performed: No See Care Plan activities for detailed interventions related to Dylan [name])   Placed outbound call to [place] to follow up on new referral for Speech/Swallowing evaluation.     Outpatient Encounter Medications as of 04/15/2020  Medication Sig Note  . allopurinol (ZYLOPRIM) 100 MG tablet Take 2 tablets (200 mg total) by mouth daily.   [name] amLODipine (NORVASC) 5 MG tablet TAKE 1 TABLET BY MOUTH DAILY   . Ascorbic Acid (VITAMIN C) 1000 MG tablet Take 1,000 mg by mouth daily.   [name] aspirin 81 MG tablet Take 81 mg by mouth 3 (three) times a week.    . carbidopa-levodopa (SINEMET IR) 25-100 MG tablet Take 1.5 tablets by mouth 3 (three) times daily.   . carvedilol (COREG) 25 MG tablet Take 1 tablet (25 mg total) by mouth 2 (two) times daily.   . Cholecalciferol (VITAMIN D3) 5000 units CAPS Take 5,000 Units by mouth every evening.   . Coenzyme Q10 200 MG capsule Take 200 mg by mouth daily.   [name] COLCRYS 0.6 MG tablet Take 1 tablet (0.6 mg total) by mouth daily.   . diclofenac Sodium (VOLTAREN) 1 % GEL Apply 2 g topically 3 (three) times daily.   [name] glucose blood (ONETOUCH VERIO) test strip Use as instructed to check blood sugars daily dx: e11.22     . Lancet Devices (ONE TOUCH DELICA LANCING DEV) MISC Please fill ONE TOUCH DELICA LANCING DEVICE.  Use to test blood sugar twice daily as directed. E11.65   . Lancets MISC Please fill basic/generic push button lancets.  Patient to test blood sugar twice daily. DX: E11.65   . Melatonin 10 MG TABS Take 10 mg by mouth at bedtime.   . memantine (NAMENDA) 10 MG tablet Take 1 tablet (10 mg total) by mouth 2 (two) times daily. 12/05/2019: Patient is only taking 1 tablet in the am but is holding the pm dose due to having headaches  . metFORMIN (GLUCOPHAGE) 1000 MG tablet TAKE 1 TABLET BY MOUTH EVERY DAY   . pravastatin (PRAVACHOL) 20 MG tablet TAKE 1 TABLET BY ORAL ROUTE EVERY DAY   . rivastigmine (EXELON) 4.6 mg/24hr Place 1 patch (4.6 mg total) onto the skin daily.   . TRULICITY 1.5 IN/8.6VE SOPN INJECT 1.5 MG INTO THE SKIN ONCE A WEEK.   . valsartan (DIOVAN) 320 MG tablet TAKE 1 TABLET BY MOUTH EVERY DAY    No facility-administered encounter medications on file as of 04/15/2020.     Objective:  Lab Results  Component Value Date   HGBA1C 7.5 (H) 03/24/2020   HGBA1C 8.1 (H) 12/23/2019   HGBA1C 10.5 (H) 09/19/2019   Lab Results  Component Value Date   MICROALBUR 80 01/22/2019   LDLCALC 66 07/24/2019   CREATININE 1.56 (  H) 03/24/2020   BP Readings from Last 3 Encounters:  04/15/20 126/78  04/07/20 118/72  03/31/20 120/70   Goals Addressed    . "to evaluate and treat chronic persistent cough"       CARE PLAN ENTRY (see longitudinal plan of care for additional care plan information)  Current Barriers:  [name] Knowledge Deficits related to evaluation and treatment of persistent cough  . Chronic Disease Management support and education needs related to Type II DM, non-insulin dependent, CKD, stage III, Essential hypertension . Cognitive Deficits . Parkinsonism . Risk for Aspiration   Nurse Case Manager Clinical Goal(s):  [name] Over the next 90 days, patient will work with established Neurologist  and PCP to address needs related to evaluation and treatment of persistent cough and or aspiration.   CCM RN CM Interventions:  04/09/20 call completed spouse [name]  . Inter-disciplinary care team collaboration (see longitudinal plan of care) . Evaluation of current treatment plan related to persistent cough with risk for aspiration and patient's adherence to plan as established by provider . Determined patient followed up with Neurology last week, [name] NP; Reviewed and discussed a referral has been placed on 03/24/20 for speech pathology to evaluate swallowing/speech . Determined the referral is still in "New Request" status; Discussed this CM RN will f/u with Neurology office regarding referral status and request to expedite the referral  . Re-iterated education to spouse re: risk for aspiration with Parkinsonism; Educated on s/s suggestive of aspiration; Educated on aspiration prevention tips and importance of reporting symptoms as soon as possible if they occur . Reviewed and discussed Neuro Assessment/Plan and medication increase for Carbidopa/Levodopa:  o 1.  Dementia with behavioral issues o 2.  Parkinsonism o -Has improved with Sinemet (walking, ability to lift legs, cognition) o -Fairly significant symmetric parkinsonian features on today's examination (rigidity, bradykinesia) o -Most consistent with central nervous system degenerative disorder, differential diagnosis including progressive supranuclear palsy o -CT head showed generalized atrophy, supratentorium small vessel disease o -Continue Namenda 10 mg twice a day, Exelon patch o -Increase Sinemet 25/100 mg 1.5 tablets 3 times a day, taken the same time everyday o -Will refer to speech therapy for swallow evaluation, due to report of intermittent cough, concern for aspiration o -Decided to hold off with home OT for utensil training-they already have a lot going on for both of them o -Follow-up in 4 months or sooner if needed  with Dr. Krista Blue . Discussed plans with patient for ongoing care management follow up and provided patient with direct contact information for care management team 04/07/20 Placed outbound call to the office of Dr. Krista Blue, Neurology . Spoke with Lattie Haw regarding the Speech Pathology referral placed by [name] NP . Advised the referral was placed on 03/24/20 and remains to be in "New Request" status . Determined [name] is the referral coordinator but is unavailable to take my call regarding the status of this referral . Provided my name/contact number and requested a return phone to f/u on this referral when possible, Lattie Haw will send her a message  04/13/20 Return call from [place] with Dr. Rhea Belton office, she is returning the call regarding Mr. Niblack Speech referral  04/15/20 Placed outbound call to [facility] [phone] . Spoke with [name] regarding the referral placed by [name] NP on 03/24/20 for Speech Pathology . Advised the referral shows to be in "New Request" status and has not been processed . Advised Mr. Mones is displaying symptoms suggestive of Impaired swallowing and or  aspiration and this should be evaluated ASAP per Speech Pathology . Discussed [name] will contact [name], patient's spouse at the end of our call to make a joint call in efforts to get Mr. Drew Speech evaluation scheduled due to Neuro Rehab is running behind on processing new referrals . Confirmed [name] has the correct contact information for Ms. [name] in order to be successful in reaching her . Encouraged [name] to give this RN a call back if assistance with Care Coordination is needed  Patient Self Care Activities:  . Attends all scheduled provider appointments . Currently UNABLE TO independently perform self-care without assistance  . Supportive caregiver, spouse [name] to assist with caregiver needs  Please see past updates related to this goal by clicking on the "Past Updates" button in the selected goal         Plan:   Telephone follow up appointment with care management team member scheduled for: 05/18/20  [name], RN, BSN, CCM Care Management Coordinator [place] [facility]  Direct Phone: [phone]

## 2020-04-15 NOTE — Patient Instructions (Addendum)
Visit Information  Goals Addressed    . "to evaluate and treat chronic persistent cough"       CARE PLAN ENTRY (see longitudinal plan of care for additional care plan information)  Current Barriers:  [name] Knowledge Deficits related to evaluation and treatment of persistent cough  . Chronic Disease Management support and education needs related to Type II DM, non-insulin dependent, CKD, stage III, Essential hypertension . Cognitive Deficits . Parkinsonism . Risk for Aspiration   Nurse Case Manager Clinical Goal(s):  [name] Over the next 90 days, patient will work with established Neurologist and PCP to address needs related to evaluation and treatment of persistent cough and or aspiration.   CCM RN CM Interventions:  04/09/20 call completed spouse [name]  . Inter-disciplinary care team collaboration (see longitudinal plan of care) . Evaluation of current treatment plan related to persistent cough with risk for aspiration and patient's adherence to plan as established by provider . Determined patient followed up with Neurology last week, [name] NP; Reviewed and discussed a referral has been placed on 03/24/20 for speech pathology to evaluate swallowing/speech . Determined the referral is still in "New Request" status; Discussed this CM RN will f/u with Neurology office regarding referral status and request to expedite the referral  . Re-iterated education to spouse re: risk for aspiration with Parkinsonism; Educated on s/s suggestive of aspiration; Educated on aspiration prevention tips and importance of reporting symptoms as soon as possible if they occur . Reviewed and discussed Neuro Assessment/Plan and medication increase for Carbidopa/Levodopa:  o 1.  Dementia with behavioral issues o 2.  Parkinsonism o -Has improved with Sinemet (walking, ability to lift legs, cognition) o -Fairly significant symmetric parkinsonian features on today's examination (rigidity, bradykinesia) o -Most  consistent with central nervous system degenerative disorder, differential diagnosis including progressive supranuclear palsy o -CT head showed generalized atrophy, supratentorium small vessel disease o -Continue Namenda 10 mg twice a day, Exelon patch o -Increase Sinemet 25/100 mg 1.5 tablets 3 times a day, taken the same time everyday o -Will refer to speech therapy for swallow evaluation, due to report of intermittent cough, concern for aspiration o -Decided to hold off with home OT for utensil training-they already have a lot going on for both of them o -Follow-up in 4 months or sooner if needed with Dr. Krista Blue . Discussed plans with patient for ongoing care management follow up and provided patient with direct contact information for care management team 04/07/20 Placed outbound call to the office of Dr. Krista Blue, Neurology . Spoke with Lattie Haw regarding the Speech Pathology referral placed by [name] NP . Advised the referral was placed on 03/24/20 and remains to be in "New Request" status . Determined [name] is the referral coordinator but is unavailable to take my call regarding the status of this referral . Provided my name/contact number and requested a return phone to f/u on this referral when possible, Lattie Haw will send her a message  04/13/20 Return call from [place] with Dr. Rhea Belton office, she is returning the call regarding Mr. Woodrick Speech referral  04/15/20 Placed outbound call to [facility] [phone] . Spoke with [name] regarding the referral placed by [name] NP on 03/24/20 for Speech Pathology . Advised the referral shows to be in "New Request" status and has not been processed . Advised Mr. Valone is displaying symptoms suggestive of Impaired swallowing and or aspiration and this should be evaluated ASAP per Speech Pathology . Discussed [name] will contact [name], patient's spouse  at the end of our call to make a joint call in efforts to get Mr. Gander Speech evaluation scheduled due  to Neuro Rehab is running behind on processing new referrals . Confirmed [name] has the correct contact information for Ms. [name] in order to be successful in reaching her . Encouraged [name] to give this RN a call back if assistance with Care Coordination is needed  Patient Self Care Activities:  . Attends all scheduled provider appointments . Currently UNABLE TO independently perform self-care without assistance  . Supportive caregiver, spouse [name] to assist with caregiver needs  Please see past updates related to this goal by clicking on the "Past Updates" button in the selected goal        Patient verbalizes understanding of instructions provided today.   Telephone follow up appointment with care management team member scheduled for: 05/18/20  [name], RN, BSN, CCM Care Management Coordinator [place] [facility]  Direct Phone: [phone]

## 2020-04-16 ENCOUNTER — Telehealth: Payer: Medicare HMO

## 2020-04-16 ENCOUNTER — Ambulatory Visit: Payer: Self-pay

## 2020-04-16 DIAGNOSIS — E1122 Type 2 diabetes mellitus with diabetic chronic kidney disease: Secondary | ICD-10-CM

## 2020-04-16 DIAGNOSIS — I10 Essential (primary) hypertension: Secondary | ICD-10-CM

## 2020-04-16 DIAGNOSIS — N183 Chronic kidney disease, stage 3 unspecified: Secondary | ICD-10-CM

## 2020-04-16 DIAGNOSIS — F0391 Unspecified dementia with behavioral disturbance: Secondary | ICD-10-CM

## 2020-04-16 DIAGNOSIS — N1831 Chronic kidney disease, stage 3a: Secondary | ICD-10-CM

## 2020-04-17 ENCOUNTER — Other Ambulatory Visit: Payer: Self-pay | Admitting: Internal Medicine

## 2020-04-17 NOTE — Patient Instructions (Addendum)
Visit Information  Goals Addressed    . "I don't know what the next steps are for Mr. Warman Dementia"       Spouse stated  Current Barriers:  [name] Knowledge Deficits related to next steps for diagnosis and treatment for new diagnosis of Dementia . Chronic Disease Management support and education needs related to DMII, Essential HTN, CKDIII, Dementia with behavior changes  Nurse Case Manager Clinical Goal(s):  [name] Over the next 14 days, spouse will collaborative with the Neuro office and receive instructions regarding brain imaging ordered for determination of type of Dementia - Goal Met  . Over the next 90 days, patient will verbalize understanding of plan for treatment management for new dx: Dementia  Goal Met . 01/16/20 New Over the next 14 days, patient will complete a follow up visit with Dr. Krista Blue, neurologist and spouse will report all new or worsening symptoms related to cognitive and behavior changes Goal Met  . 01/16/20 New Over the next 30 days, patient and spouse will verbalize having a good understanding of the treatment plan recommended by Dr. Krista Blue and will adhere to her recommendations Goal Met . 01/16/20 New Over the next 90 days, spouse will be able to provide the care needed to patient without difficulty or caregiver burnout AEB spouse will report feeling satisfied with the care she is able to provide Mr. Dempster and will verbalize having all resources needed  CCM RN CM Interventions:  04/16/20: call completed with spouse [name] . 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 Determined patient is adhering to his prescribed treatment plan with the assistance of his spouse [name]  . Determined spouse believes patient's symptoms of rigidity, tremors and slurred speech have improved since staring the new therapy, however, Mr. Deshmukh continues to have difficulty with dexterity, such as gripping and releasing as well as  delayed reflexes in his hands . Determined Neurologist provider recommended OT during last visit once patient is ready . Determined spouse would like to initiate this service and would like to have order sent to previous OT/PT provider; [place] [facility] [place], the [facility] location in [place] . Sent in basket message to [name], Neuro NP with request to please place this referral when possible  . Reinforced the importance of patient taking this medication exactly as prescribed w/o missed or late doses for best effectiveness . Discussed plans with [name] for ongoing care management follow up and confirmed she has the direct contact information for care management team  Patient Self Care Activities  . Attends all scheduled provider appointments . Currently UNABLE TO independently perform self-care without assistance  . Supportive caregiver, spouse [name] to assist with caregiver needs  Please see past updates related to this goal by clicking on the "Past Updates" button in the selected goal      . "to evaluate and treat chronic persistent cough"       CARE PLAN ENTRY (see longitudinal plan of care for additional care plan information)  Current Barriers:  [name] Knowledge Deficits related to evaluation and treatment of persistent cough  . Chronic Disease Management support and education needs related to Type II DM, non-insulin dependent, CKD, stage III, Essential hypertension . Cognitive Deficits . Parkinsonism . Risk for Aspiration   Nurse Case Manager Clinical Goal(s):  [name] Over the next 90 days, patient will work with established Neurologist and PCP to address needs related to evaluation and treatment of persistent  cough and or aspiration.   CCM RN CM Interventions:  04/15/20 Placed outbound call to [place] . Spoke with [name] regarding the referral placed by [name] NP on 03/24/20 for Speech Pathology . Advised the referral shows to be in "New  Request" status and has not been processed . Advised Mr. Syler is displaying symptoms suggestive of Impaired swallowing and or aspiration and this should be evaluated ASAP per Speech Pathology . Discussed [name] will contact [name], patient's spouse at the end of our call to make a joint call in efforts to get Mr. Haskin Speech evaluation scheduled due to Neuro Rehab is running behind on processing new referrals . Confirmed [name] has the correct contact information for Ms. [name] in order to be successful in reaching her . Encouraged [name] to give this RN a call back if assistance with Care Coordination is needed 04/16/20 Call completed with spouse [name]  . Confirmed Mr. Dorthula Rue has a new patient appointment with [facility] for ST evaluation scheduled for 05/19/20 '@08' :45 AM . Confirmed his next scheduled f/u with Dr. Krista Blue is scheduled for 07/27/20 '@1'  PM unless symptoms worsen . Reiterated importance of reporting signs/symptoms suggestive of aspiration promptly, keeping patient in upright position while eating and for at least 20 minutes following a meal/snack, cutting food into smaller pieces and encouraging patient to take smaller bites while taking his time eating  . Discussed plans with patient for ongoing care management follow up and provided patient with direct contact information for care management team  Patient Self Care Activities:  . Attends all scheduled provider appointments . Currently UNABLE TO independently perform self-care without assistance  . Supportive caregiver, spouse [name] to assist with caregiver needs  Please see past updates related to this goal by clicking on the "Past Updates" button in the selected goal         Patient verbalizes understanding of instructions provided today.   Telephone follow up appointment with care management team member scheduled for: 05/18/20  [name], RN, BSN, CCM Care Management Coordinator [place] [facility]  Direct Phone: [phone]

## 2020-04-17 NOTE — Chronic Care Management (AMB) (Signed)
Chronic Care Management   Follow Up Note   04/16/2020 Name: Dylan [name]. MRN: [id number] DOB: 05-Oct-1941  Referred by: [name], MD Reason for referral : Chronic Care Management (FU RN CM Call )   Dylan [name]. is a 79 y.o. year old male who is a primary care patient of [name], MD. The CCM team was consulted for assistance with chronic disease management and care coordination needs.    Review of patient status, including review of consultants reports, relevant laboratory and other test results, and collaboration with appropriate care team members and the patient's provider was performed as part of comprehensive patient evaluation and provision of chronic care management services.    SDOH (Social Determinants of Health) assessments performed: Yes - no acute needs  See Care Plan activities for detailed interventions related to [place])   Placed outbound call to spouse [name] for a CCM RN CM follow up call regarding patient's ST evaluation.     Outpatient Encounter Medications as of 04/16/2020  Medication Sig Note  . allopurinol (ZYLOPRIM) 100 MG tablet Take 2 tablets (200 mg total) by mouth daily.   [name] amLODipine (NORVASC) 5 MG tablet TAKE 1 TABLET BY MOUTH DAILY   . Ascorbic Acid (VITAMIN C) 1000 MG tablet Take 1,000 mg by mouth daily.   [name] aspirin 81 MG tablet Take 81 mg by mouth 3 (three) times a week.    . carbidopa-levodopa (SINEMET IR) 25-100 MG tablet Take 1.5 tablets by mouth 3 (three) times daily.   . carvedilol (COREG) 25 MG tablet Take 1 tablet (25 mg total) by mouth 2 (two) times daily.   . Cholecalciferol (VITAMIN D3) 5000 units CAPS Take 5,000 Units by mouth every evening.   . Coenzyme Q10 200 MG capsule Take 200 mg by mouth daily.   [name] COLCRYS 0.6 MG tablet Take 1 tablet (0.6 mg total) by mouth daily.   . diclofenac Sodium (VOLTAREN) 1 % GEL Apply 2 g topically 3 (three) times daily.   [name] glucose blood (ONETOUCH VERIO) test strip Use as instructed to  check blood sugars daily dx: e11.22   . Lancet Devices (ONE TOUCH DELICA LANCING DEV) MISC Please fill ONE TOUCH DELICA LANCING DEVICE.  Use to test blood sugar twice daily as directed. E11.65   . Lancets MISC Please fill basic/generic push button lancets.  Patient to test blood sugar twice daily. DX: E11.65   . Melatonin 10 MG TABS Take 10 mg by mouth at bedtime.   . memantine (NAMENDA) 10 MG tablet Take 1 tablet (10 mg total) by mouth 2 (two) times daily. 12/05/2019: Patient is only taking 1 tablet in the am but is holding the pm dose due to having headaches  . pravastatin (PRAVACHOL) 20 MG tablet TAKE 1 TABLET BY ORAL ROUTE EVERY DAY   . rivastigmine (EXELON) 4.6 mg/24hr Place 1 patch (4.6 mg total) onto the skin daily.   . TRULICITY 1.5 TI/4.5YK SOPN INJECT 1.5 MG INTO THE SKIN ONCE A WEEK.   . valsartan (DIOVAN) 320 MG tablet TAKE 1 TABLET BY MOUTH EVERY DAY   . [DISCONTINUED] metFORMIN (GLUCOPHAGE) 1000 MG tablet TAKE 1 TABLET BY MOUTH EVERY DAY    No facility-administered encounter medications on file as of 04/16/2020.     Objective:  Lab Results  Component Value Date   HGBA1C 7.5 (H) 03/24/2020   HGBA1C 8.1 (H) 12/23/2019   HGBA1C 10.5 (H) 09/19/2019   Lab Results  Component Value Date   MICROALBUR 80  01/22/2019   LDLCALC 66 07/24/2019   CREATININE 1.56 (H) 03/24/2020   BP Readings from Last 3 Encounters:  04/15/20 126/78  04/07/20 118/72  03/31/20 120/70    Goals Addressed    . "I don't know what the next steps are for Dylan [name] Dementia"       Spouse stated  Current Barriers:  [name] Knowledge Deficits related to next steps for diagnosis and treatment for new diagnosis of Dementia . Chronic Disease Management support and education needs related to DMII, Essential HTN, CKDIII, Dementia with behavior changes  Nurse Case Manager Clinical Goal(s):  [name] Over the next 14 days, spouse will collaborative with the Neuro office and receive instructions regarding brain imaging  ordered for determination of type of Dementia - Goal Met  . Over the next 90 days, patient will verbalize understanding of plan for treatment management for new dx: Dementia  Goal Met . 01/16/20 New Over the next 14 days, patient will complete a follow up visit with Dr. Krista Blue, neurologist and spouse will report all new or worsening symptoms related to cognitive and behavior changes Goal Met  . 01/16/20 New Over the next 30 days, patient and spouse will verbalize having a good understanding of the treatment plan recommended by Dr. Krista Blue and will adhere to her recommendations Goal Met . 01/16/20 New Over the next 90 days, spouse will be able to provide the care needed to patient without difficulty or caregiver burnout AEB spouse will report feeling satisfied with the care she is able to provide Dylan [name] and will verbalize having all resources needed  CCM RN CM Interventions:  04/16/20: call completed with spouse [name] . 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 Determined patient is adhering to his prescribed treatment plan with the assistance of his spouse [name]  . Determined spouse believes patient's symptoms of rigidity, tremors and slurred speech have improved since staring the new therapy, however, [name] continues to have difficulty with dexterity, such as gripping and releasing as well as delayed reflexes in his hands . Determined Neurologist provider recommended OT during last visit once patient is ready . Determined spouse would like to initiate this service and would like to have order sent to previous OT/PT provider; [place] [facility] [place] location in [place] . Sent in basket message to [name], Neuro NP with request to please place this referral when possible  . Reinforced the importance of patient taking this medication exactly as prescribed w/o missed or late doses for best  effectiveness . Discussed plans with [name] for ongoing care management follow up and confirmed she has the direct contact information for care management team  Patient Self Care Activities  . Attends all scheduled provider appointments . Currently UNABLE TO independently perform self-care without assistance  . Supportive caregiver, spouse [name] to assist with caregiver needs  Please see past updates related to this goal by clicking on the "Past Updates" button in the selected goal      . "to evaluate and treat chronic persistent cough"       CARE PLAN ENTRY (see longitudinal plan of care for additional care plan information)  Current Barriers:  [name] Knowledge Deficits related to evaluation and treatment of persistent cough  . Chronic Disease Management support and education needs related to Type II DM, non-insulin dependent, CKD, stage III, Essential hypertension . Cognitive Deficits . Parkinsonism . Risk for Aspiration  Nurse Case Manager Clinical Goal(s):  [name] Over the next 90 days, patient will work with established Neurologist and PCP to address needs related to evaluation and treatment of persistent cough and or aspiration.   CCM RN CM Interventions:  04/15/20 Placed outbound call to [place] . Spoke with [name] regarding the referral placed by [name] NP on 03/24/20 for Speech Pathology . Advised the referral shows to be in "New Request" status and has not been processed . Advised Mr. Bignell is displaying symptoms suggestive of Impaired swallowing and or aspiration and this should be evaluated ASAP per Speech Pathology . Discussed [name] will contact [name], patient's spouse at the end of our call to make a joint call in efforts to get Mr. Mayhall Speech evaluation scheduled due to Neuro Rehab is running behind on processing new referrals . Confirmed [name] has the correct contact information for Ms. [name] in order to be successful in reaching  her . Encouraged [name] to give this RN a call back if assistance with Care Coordination is needed 04/16/20 Call completed with spouse [name]  . Confirmed Mr. Dorthula Rue has a new patient appointment with [facility] for ST evaluation scheduled for 05/19/20 '@08' :45 AM . Confirmed his next scheduled f/u with Dr. Krista Blue is scheduled for 07/27/20 '@1'  PM unless symptoms worsen . Reiterated importance of reporting signs/symptoms suggestive of aspiration promptly, keeping patient in upright position while eating and for at least 20 minutes following a meal/snack, cutting food into smaller pieces and encouraging patient to take smaller bites while taking his time eating  . Discussed plans with patient for ongoing care management follow up and provided patient with direct contact information for care management team  Patient Self Care Activities:  . Attends all scheduled provider appointments . Currently UNABLE TO independently perform self-care without assistance  . Supportive caregiver, spouse [name] to assist with caregiver needs  Please see past updates related to this goal by clicking on the "Past Updates" button in the selected goal         Plan:   Telephone follow up appointment with care management team member scheduled for: 05/18/20  [name], RN, BSN, CCM Care Management Coordinator [facility]  Direct Phone: [phone]

## 2020-04-20 ENCOUNTER — Telehealth: Payer: Self-pay | Admitting: Neurology

## 2020-04-20 DIAGNOSIS — G20 Parkinson's disease: Secondary | ICD-10-CM

## 2020-04-20 DIAGNOSIS — R269 Unspecified abnormalities of gait and mobility: Secondary | ICD-10-CM

## 2020-04-20 NOTE — Telephone Encounter (Signed)
Received message from case manager, patient is ready to start PT/OT. I will place the order.   [name] [name] feels Mr. Dylan [name] is ready to start outpatient PT/OT as discussed with you during his last visit. She would like to have the referral sent to [facility] [place] location in [place] where he has received services in the past.

## 2020-04-21 ENCOUNTER — Ambulatory Visit (INDEPENDENT_AMBULATORY_CARE_PROVIDER_SITE_OTHER): Payer: Medicare HMO

## 2020-04-21 ENCOUNTER — Ambulatory Visit: Payer: Medicare HMO

## 2020-04-21 ENCOUNTER — Other Ambulatory Visit: Payer: Self-pay

## 2020-04-21 ENCOUNTER — Ambulatory Visit: Payer: Medicare HMO | Admitting: Podiatry

## 2020-04-21 VITALS — BP 126/80 | HR 72 | Temp 98.0°F | Ht 68.0 in | Wt 179.0 lb

## 2020-04-21 DIAGNOSIS — E538 Deficiency of other specified B group vitamins: Secondary | ICD-10-CM

## 2020-04-21 MED ORDER — CYANOCOBALAMIN 1000 MCG/ML IJ SOLN
1000.0000 ug | Freq: Once | INTRAMUSCULAR | Status: AC
  Administered 2020-04-21: 1000 ug via INTRAMUSCULAR

## 2020-04-21 NOTE — Progress Notes (Signed)
Pt presents today for B-12 injection

## 2020-04-22 ENCOUNTER — Ambulatory Visit: Payer: Self-pay

## 2020-04-22 ENCOUNTER — Telehealth: Payer: Medicare HMO

## 2020-04-22 DIAGNOSIS — E1122 Type 2 diabetes mellitus with diabetic chronic kidney disease: Secondary | ICD-10-CM

## 2020-04-22 DIAGNOSIS — N183 Chronic kidney disease, stage 3 unspecified: Secondary | ICD-10-CM

## 2020-04-22 DIAGNOSIS — N1831 Chronic kidney disease, stage 3a: Secondary | ICD-10-CM

## 2020-04-22 DIAGNOSIS — F0391 Unspecified dementia with behavioral disturbance: Secondary | ICD-10-CM

## 2020-04-22 NOTE — Chronic Care Management (AMB) (Signed)
Chronic Care Management    Social Work Follow Up Note  04/22/2020 Name: Dylan [name]. MRN: [id number] DOB: [birth year]  Dylan [name]. is a 79 y.o. year old male who is a primary care patient of [name], MD. The CCM team was consulted for assistance with care coordination.   Review of patient status, including review of consultants reports, other relevant assessments, and collaboration with appropriate care team members and the patient's provider was performed as part of comprehensive patient evaluation and provision of chronic care management services.    SDOH (Social Determinants of Health) assessments performed: No    Outpatient Encounter Medications as of 04/22/2020  Medication Sig Note  . allopurinol (ZYLOPRIM) 100 MG tablet Take 2 tablets (200 mg total) by mouth daily.   [name] amLODipine (NORVASC) 5 MG tablet TAKE 1 TABLET BY MOUTH DAILY   . Ascorbic Acid (VITAMIN C) 1000 MG tablet Take 1,000 mg by mouth daily.   [name] aspirin 81 MG tablet Take 81 mg by mouth 3 (three) times a week.    . carbidopa-levodopa (SINEMET IR) 25-100 MG tablet Take 1.5 tablets by mouth 3 (three) times daily.   . carvedilol (COREG) 25 MG tablet Take 1 tablet (25 mg total) by mouth 2 (two) times daily.   . Cholecalciferol (VITAMIN D3) 5000 units CAPS Take 5,000 Units by mouth every evening.   . Coenzyme Q10 200 MG capsule Take 200 mg by mouth daily.   [name] COLCRYS 0.6 MG tablet Take 1 tablet (0.6 mg total) by mouth daily.   . diclofenac Sodium (VOLTAREN) 1 % GEL Apply 2 g topically 3 (three) times daily.   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 Lancet Devices (ONE TOUCH DELICA LANCING DEV) MISC Please fill ONE TOUCH DELICA LANCING DEVICE.  Use to test blood sugar twice daily as directed. E11.65   . Lancets MISC Please fill basic/generic push button lancets.  Patient to test blood sugar twice daily. DX: E11.65   . Melatonin 10 MG TABS Take 10  mg by mouth at bedtime.   . memantine (NAMENDA) 10 MG tablet Take 1 tablet (10 mg total) by mouth 2 (two) times daily. 12/05/2019: Patient is only taking 1 tablet in the am but is holding the pm dose due to having headaches  . metFORMIN (GLUCOPHAGE) 1000 MG tablet TAKE 1 TABLET BY MOUTH EVERY DAY   . pravastatin (PRAVACHOL) 20 MG tablet TAKE 1 TABLET BY ORAL ROUTE EVERY DAY   . rivastigmine (EXELON) 4.6 mg/24hr Place 1 patch (4.6 mg total) onto the skin daily.   . TRULICITY 1.5 FX/9.0WI SOPN INJECT 1.5 MG INTO THE SKIN ONCE A WEEK.   . valsartan (DIOVAN) 320 MG tablet TAKE 1 TABLET BY MOUTH EVERY DAY    No facility-administered encounter medications on file as of 04/22/2020.     Goals Addressed            This Visit's Progress   . "I would like some resources for adult daycare's in the area and private duty nursing services"       [place] (see longtitudinal plan of care for additional care plan information)  Current Barriers:  [name] Knowledge Deficits related to education and resource information on locating an adult daycare and or reliable trustworthy private duty nursing services . Chronic Disease Management support and education needs related to Type II DM, CKD stage III, Essential HTN  Nurse Case  Manager Clinical Goal(s):  [name] Over the next 30 days, patient will work with embedded [place] to address needs related to resources for assistance with patient care such as adult daycare and private duty nursing   Goal not met due to inability to maintain patient contact . New 04/22/20 Over the next 45 days the patient will work with SW to become more knowledgeable of caregiver resources available to the patient  CCM SW Interventions Completed 04/22/20 with [place] with RN Care Manager who indicated patient caregiver interest in respite resources to assist with occasional overnight care needs . Communication with [name] to assess caregiver respite  needs . Determined Dylan [name] owns a home in [place] and is interested in resources to assist with patient care needs on weekends should she go out of town to [place] o Advised Dylan [name] there are minimal options to assist with weekend care outside of the home o Provided education surrounding local adult day program assisting with care needs M-F during regular business hours o Reviewed opportunity for respite placement in a facility but requiring a minimum stay; Dylan [name] reports interest for just 2-3 days at a time o Advised Dylan [name] the best option would be in home care provider by an in home aide company o Mailed a list of in home aide providers that service patients county . Discussed Dylan [name] continues to have plans to go on a trip with her grand-daughter in Spring of 2022 when her grand-daughter graduates o Dylan [name] reports she has contacted an ALF which is affordable and close to home but they require a 14 day minimum respite state and she will only need a 10 day respite o Advised Dylan [name] most ALF do require a 14 day minimum stay o Attempted to provide a list of ALF communities within [place] for Dylan [name] to contact; Dylan [name] declined stating she was okay with the 14 day stay as she felt this community was very affordable . Scheduled follow up call over the next 4 weeks to confirm receipt of mailed resource . Collaboration with RN Care Manager regarding plan for caregiver resources  Patient Self Care Activities:  . Calls provider office for new concerns or questions . Unable to self administer medications as prescribed . Lacks social connections . Unable to perform ADLs independently . Unable to perform IADLs independently  Please see past updates related to this goal by clicking on the "Past Updates" button in the selected goal          Follow Up Plan: SW will follow up with patient by phone over the next 4 weeks.   Daneen Schick, BSW,  CDP Social Worker, Certified Dementia Practitioner [place] [facility] [phone]  Total time spent performing care coordination and/or care management activities with the patient by phone or face to face = 25 minutes.

## 2020-04-22 NOTE — Chronic Care Management (AMB) (Signed)
  Chronic Care Management   Outreach Note  04/22/2020 Name: Benjermin Korber. MRN: [id number] DOB: [birth year]  Referred by: [name], MD Reason for referral : Care Coordination   SW placed an unsuccessful outbound call to the patients spouse and caregiver, [name], in response to a voice message received. SW left a HIPAA compliant voice message requesting a return call.  Follow Up Plan: The care management team will reach out to the patient again over the next 14 days.   Daneen Schick, BSW, CDP Social Worker, Certified Dementia Practitioner [place] [facility] [phone]

## 2020-04-22 NOTE — Patient Instructions (Signed)
Social Worker Visit Information  Goals we discussed today:  Goals Addressed            This Visit's Progress   . "I would like some resources for adult daycare's in the area and private duty nursing services"       [place] (see longtitudinal plan of care for additional care plan information)  Current Barriers:  [name] Knowledge Deficits related to education and resource information on locating an adult daycare and or reliable trustworthy private duty nursing services . Chronic Disease Management support and education needs related to Type II DM, CKD stage III, Essential HTN  Nurse Case Manager Clinical Goal(s):  [name] Over the next 30 days, patient will work with embedded [place] to address needs related to resources for assistance with patient care such as adult daycare and private duty nursing   Goal not met due to inability to maintain patient contact . New 04/22/20 Over the next 45 days the patient will work with SW to become more knowledgeable of caregiver resources available to the patient  CCM SW Interventions Completed 04/22/20 with [place] with RN Care Manager who indicated patient caregiver interest in respite resources to assist with occasional overnight care needs . Communication with [name] to assess caregiver respite needs . Determined [name] owns a home in [place] and is interested in resources to assist with patient care needs on weekends should she go out of town to [place] o Advised [name] there are minimal options to assist with weekend care outside of the home o Provided education surrounding local adult day program assisting with care needs M-F during regular business hours o Reviewed opportunity for respite placement in a facility but requiring a minimum stay; [name] reports interest for just 2-3 days at a time o Advised [name] the best option would be in home care provider by an in home aide  company o Mailed a list of in home aide providers that service patients county . Discussed [name] continues to have plans to go on a trip with her grand-daughter in Spring of 2022 when her grand-daughter graduates o [name] reports she has contacted an ALF which is affordable and close to home but they require a 14 day minimum respite state and she will only need a 10 day respite o Advised [name] most ALF do require a 14 day minimum stay o Attempted to provide a list of ALF communities within [place] for [name] to contact; [name] declined stating she was okay with the 14 day stay as she felt this community was very affordable . Scheduled follow up call over the next 4 weeks to confirm receipt of mailed resource . Collaboration with RN Care Manager regarding plan for caregiver resources  Patient Self Care Activities:  . Calls provider office for new concerns or questions . Unable to self administer medications as prescribed . Lacks social connections . Unable to perform ADLs independently . Unable to perform IADLs independently  Please see past updates related to this goal by clicking on the "Past Updates" button in the selected goal          Materials Provided: Yes: provided verbal and written information on caregiver respite resources  Follow Up Plan: SW will follow up with patient by phone over the next four weeks.   Daneen Schick, BSW, CDP Social Worker, Certified Dementia Practitioner [facility] [phone]

## 2020-04-24 ENCOUNTER — Telehealth: Payer: Medicare HMO

## 2020-04-24 ENCOUNTER — Ambulatory Visit: Payer: Self-pay

## 2020-04-24 ENCOUNTER — Other Ambulatory Visit: Payer: Self-pay

## 2020-04-24 DIAGNOSIS — I1310 Hypertensive heart and chronic kidney disease without heart failure, with stage 1 through stage 4 chronic kidney disease, or unspecified chronic kidney disease: Secondary | ICD-10-CM

## 2020-04-24 DIAGNOSIS — F0391 Unspecified dementia with behavioral disturbance: Secondary | ICD-10-CM

## 2020-04-24 DIAGNOSIS — I10 Essential (primary) hypertension: Secondary | ICD-10-CM

## 2020-04-24 DIAGNOSIS — N1831 Chronic kidney disease, stage 3a: Secondary | ICD-10-CM

## 2020-04-24 DIAGNOSIS — N183 Chronic kidney disease, stage 3 unspecified: Secondary | ICD-10-CM

## 2020-04-24 NOTE — Chronic Care Management (AMB) (Signed)
  Chronic Care Management   Outreach Note  04/24/2020 Name: Dylan [name]. MRN: [id number] DOB: 1941/06/08  Referred by: [name], MD Reason for referral : Chronic Care Management (FU RN CM inbound Call from patient's spouse )   Received voice message from spouse [name] requesting a call back ASAP regarding Mr. Waldschmidt. An unsuccessful telephone outreach was attempted today. The patient was referred to the case management team for assistance with care management and care coordination.   Follow Up Plan: A HIPPA compliant phone message was left for the patient providing contact information and requesting a return call.  Telephone follow up appointment with care management team member scheduled for: 04/29/20  [name], RN, BSN, CCM Care Management Coordinator [facility]  Direct Phone: [phone]

## 2020-04-26 ENCOUNTER — Encounter (HOSPITAL_COMMUNITY): Payer: Self-pay

## 2020-04-26 ENCOUNTER — Emergency Department (HOSPITAL_COMMUNITY): Payer: Medicare HMO

## 2020-04-26 ENCOUNTER — Emergency Department (HOSPITAL_COMMUNITY)
Admission: EM | Admit: 2020-04-26 | Discharge: 2020-04-26 | Disposition: A | Discharge status: Home / Self Care | Payer: Medicare HMO | Source: Home / Self Care | Attending: Emergency Medicine | Admitting: Emergency Medicine

## 2020-04-26 ENCOUNTER — Other Ambulatory Visit: Payer: Self-pay

## 2020-04-26 DIAGNOSIS — I129 Hypertensive chronic kidney disease with stage 1 through stage 4 chronic kidney disease, or unspecified chronic kidney disease: Secondary | ICD-10-CM | POA: Insufficient documentation

## 2020-04-26 DIAGNOSIS — Y9389 Activity, other specified: Secondary | ICD-10-CM | POA: Insufficient documentation

## 2020-04-26 DIAGNOSIS — R55 Syncope and collapse: Secondary | ICD-10-CM | POA: Diagnosis not present

## 2020-04-26 DIAGNOSIS — E1122 Type 2 diabetes mellitus with diabetic chronic kidney disease: Secondary | ICD-10-CM | POA: Insufficient documentation

## 2020-04-26 DIAGNOSIS — W010XXA Fall on same level from slipping, tripping and stumbling without subsequent striking against object, initial encounter: Secondary | ICD-10-CM | POA: Insufficient documentation

## 2020-04-26 DIAGNOSIS — Z7984 Long term (current) use of oral hypoglycemic drugs: Secondary | ICD-10-CM | POA: Insufficient documentation

## 2020-04-26 DIAGNOSIS — Z7982 Long term (current) use of aspirin: Secondary | ICD-10-CM | POA: Insufficient documentation

## 2020-04-26 DIAGNOSIS — N1831 Chronic kidney disease, stage 3a: Secondary | ICD-10-CM | POA: Insufficient documentation

## 2020-04-26 DIAGNOSIS — Z043 Encounter for examination and observation following other accident: Secondary | ICD-10-CM | POA: Insufficient documentation

## 2020-04-26 DIAGNOSIS — Z950 Presence of cardiac pacemaker: Secondary | ICD-10-CM | POA: Insufficient documentation

## 2020-04-26 DIAGNOSIS — Y998 Other external cause status: Secondary | ICD-10-CM | POA: Insufficient documentation

## 2020-04-26 DIAGNOSIS — G20 Parkinson's disease: Secondary | ICD-10-CM | POA: Insufficient documentation

## 2020-04-26 DIAGNOSIS — Z87891 Personal history of nicotine dependence: Secondary | ICD-10-CM | POA: Insufficient documentation

## 2020-04-26 DIAGNOSIS — Z79899 Other long term (current) drug therapy: Secondary | ICD-10-CM | POA: Insufficient documentation

## 2020-04-26 DIAGNOSIS — Y92010 Kitchen of single-family (private) house as the place of occurrence of the external cause: Secondary | ICD-10-CM | POA: Insufficient documentation

## 2020-04-26 DIAGNOSIS — I951 Orthostatic hypotension: Secondary | ICD-10-CM | POA: Diagnosis not present

## 2020-04-26 DIAGNOSIS — W19XXXA Unspecified fall, initial encounter: Secondary | ICD-10-CM

## 2020-04-26 LAB — CBC
HCT: 43.1 % (ref 39.0–52.0)
Hemoglobin: 14 g/dL (ref 13.0–17.0)
MCH: 28.9 pg (ref 26.0–34.0)
MCHC: 32.5 g/dL (ref 30.0–36.0)
MCV: 89 fL (ref 80.0–100.0)
Platelets: 169 10*3/uL (ref 150–400)
RBC: 4.84 MIL/uL (ref 4.22–5.81)
RDW: 12.5 % (ref 11.5–15.5)
WBC: 8.4 10*3/uL (ref 4.0–10.5)
nRBC: 0 % (ref 0.0–0.2)

## 2020-04-26 LAB — COMPREHENSIVE METABOLIC PANEL
ALT: 38 U/L (ref 0–44)
AST: 101 U/L — ABNORMAL HIGH (ref 15–41)
Albumin: 3.7 g/dL (ref 3.5–5.0)
Alkaline Phosphatase: 103 U/L (ref 38–126)
Anion gap: 15 (ref 5–15)
BUN: 21 mg/dL (ref 8–23)
CO2: 22 mmol/L (ref 22–32)
Calcium: 9.5 mg/dL (ref 8.9–10.3)
Chloride: 103 mmol/L (ref 98–111)
Creatinine, Ser: 1.4 mg/dL — ABNORMAL HIGH (ref 0.61–1.24)
GFR calc Af Amer: 55 mL/min — ABNORMAL LOW (ref >60)
GFR calc non Af Amer: 48 mL/min — ABNORMAL LOW (ref >60)
Glucose, Bld: 200 mg/dL — ABNORMAL HIGH (ref 70–99)
Potassium: 4.6 mmol/L (ref 3.5–5.1)
Sodium: 140 mmol/L (ref 135–145)
Total Bilirubin: 1.3 mg/dL — ABNORMAL HIGH (ref 0.3–1.2)
Total Protein: 7.2 g/dL (ref 6.5–8.1)

## 2020-04-26 NOTE — Discharge Instructions (Signed)
No evidence of injury noted on your exam today. 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

## 2020-04-26 NOTE — ED Provider Notes (Signed)
[facility] EMERGENCY DEPARTMENT Provider Note   CSN: [id number] Arrival date & time: 04/26/20  1633     History Chief Complaint  Patient presents with  . Fall    Dylan [name]. is a 79 y.o. male. Level 5 caveat secondary to dementia HPI 79 year old male history of complete heart block with pacemaker, chronic kidney disease, aortic stenosis, Parkinson's dementia, presents today after fall.  Patient states that he lost his balance.  He was in the kitchen.  His wife is not present.  She came home to find him on the ground.  He denies any pain.  He reports that he has had multiple episodes of this before.  The wife states that she is unaware of this.  He denies any head injury.    Past Medical History:  Diagnosis Date  . Anxiety   . Aortic stenosis    mild AS 11/24/16 (peak grad 24, mean grad 11) Dr. [name]  . CHB (complete heart block) ([place]) 07/2017  . 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 Diabetes mellitus without complication ([place])   . Gait abnormality   . Gout   . Hyperlipemia   . Hypertension   . 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 Memory loss   . Peripheral arterial disease ([place])   . 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 Second degree AV block    Wenckebach; no indication for pacemaker as of 12/01/16 (Dr. [name])  . Vitamin B12 deficiency anemia 03/01/2018  . Vitamin D deficiency disease     Patient Active Problem List   Diagnosis Date Noted  . Parkinsonism ([place]) 01/29/2020  . 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 Dementia with behavioral disturbance ([place]) 05/22/2019  . Gait abnormality 05/22/2019  . Acute kidney injury (nontraumatic) ([place]) 07/14/2018  . Nephropathy 07/14/2018  . Other disorders of lung 07/14/2018  . Atherosclerosis of aorta ([place]) 07/14/2018  . Dehydration 07/14/2018  . 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 CKD (chronic kidney disease), stage III 06/13/2018  . Syncope  06/13/2018  . 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[place]) 07/11/2017  . 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[place]) 06/23/2015  . Peripheral arterial disease ([place]) 05/13/2014  . Essential hypertension 05/13/2014  . Type 2 diabetes mellitus with stage 3a chronic kidney disease, without long-term current use of insulin ([place]) 05/13/2014  . Second degree AV block 05/13/2014    Past Surgical History:  Procedure Laterality Date  . CARDIOVASCULAR STRESS TEST     11/21/16 Low risk study, EF 52% [facility])  . EYE SURGERY    . KYPHOPLASTY N/A 02/15/2017   Procedure: LUMBAR FOUR KYPHOPLASTY;  Surgeon: Phylliss Bob, MD;  Location: [facility];  Service: Orthopedics;  Laterality: N/A;  . lipoma surgery     neck - 30 years ago  . PACEMAKER IMPLANT N/A 07/11/2017   Procedure: Pacemaker Implant;  Surgeon: Constance Haw, MD;  Location: [facility] CV [facility];  Service: Cardiovascular;  Laterality: N/A;  . TRANSTHORACIC ECHOCARDIOGRAM     11/24/16 [facility]): EF 55-60%, mild AS, mild-mod MR, mod TR, moderate pulm HTN, PAP 49 mmHg       Family History  Problem Relation Age of Onset  . Diabetes Mother   . Breast cancer Mother   . Arthritis Mother   . Hypertension Father   . Diabetes Sister   . Diabetes Brother   . Diabetes Maternal Grandmother   . Diabetes Brother   . Diabetes Brother   . Diabetes  Brother   . Diabetes Sister   . Diabetes Sister   . Diabetes Sister     Social History   Tobacco Use  . Smoking status: Former Smoker    Packs/day: 0.50    Years: 7.00    Pack years: 3.50  . Smokeless tobacco: Never Used  . Tobacco comment: quit 35 years  Vaping Use  . Vaping Use: Never used  Substance Use Topics  . Alcohol use: No  . Drug use: No    Home Medications Prior to Admission medications   Medication Sig Start Date End Date Taking? Authorizing Provider  allopurinol (ZYLOPRIM) 100 MG tablet Take 2 tablets (200 mg total) by mouth daily. 03/31/20   [name], MD  amLODipine  (NORVASC) 5 MG tablet TAKE 1 TABLET BY MOUTH DAILY 02/24/20   [name], MD  Ascorbic Acid (VITAMIN C) 1000 MG tablet Take 1,000 mg by mouth daily.    [provider]  aspirin 81 MG tablet Take 81 mg by mouth 3 (three) times a week.     [provider]  carbidopa-levodopa (SINEMET IR) 25-100 MG tablet Take 1.5 tablets by mouth 3 (three) times daily. 03/24/20   Suzzanne Cloud, NP  carvedilol (COREG) 25 MG tablet Take 1 tablet (25 mg total) by mouth 2 (two) times daily. 12/02/19   Camnitz, Ocie Doyne, MD  Cholecalciferol (VITAMIN D3) 5000 units CAPS Take 5,000 Units by mouth every evening.    [provider]  Coenzyme Q10 200 MG capsule Take 200 mg by mouth daily.    [provider]  COLCRYS 0.6 MG tablet Take 1 tablet (0.6 mg total) by mouth daily. 10/12/18   [name], MD  diclofenac Sodium (VOLTAREN) 1 % GEL Apply 2 g topically 3 (three) times daily. 10/10/19   Rodriguez-Southworth, Sunday Spillers, PA-C  glucose blood (ONETOUCH VERIO) test strip Use as instructed to check blood sugars daily dx: e11.22 01/06/20   [name], MD  Lancet Devices (ONE TOUCH DELICA LANCING DEV) MISC Please fill ONE TOUCH DELICA LANCING DEVICE.  Use to test blood sugar twice daily as directed. E11.65 11/15/19   [name], MD  Lancets MISC Please fill basic/generic push button lancets.  Patient to test blood sugar twice daily. DX: E11.65 12/06/19   [name], MD  Melatonin 10 MG TABS Take 10 mg by mouth at bedtime.    [provider]  memantine (NAMENDA) 10 MG tablet Take 1 tablet (10 mg total) by mouth 2 (two) times daily. 08/07/19   [name], MD  metFORMIN (GLUCOPHAGE) 1000 MG tablet TAKE 1 TABLET BY MOUTH EVERY DAY 04/17/20   [name], MD  pravastatin (PRAVACHOL) 20 MG tablet TAKE 1 TABLET BY ORAL ROUTE EVERY DAY 02/24/20   [name], MD  rivastigmine (EXELON) 4.6 mg/24hr Place 1 patch (4.6 mg total) onto the skin daily. 01/29/20   Marcial Pacas, MD    TRULICITY 1.5 OV/5.6EP SOPN INJECT 1.5 MG INTO THE SKIN ONCE A WEEK. 09/10/19   [name], MD  valsartan (DIOVAN) 320 MG tablet TAKE 1 TABLET BY MOUTH EVERY DAY 08/29/19   [name], MD    Allergies    Patient has no known allergies.  Review of Systems   Review of Systems  All other systems reviewed and are negative.   Physical Exam Updated Vital Signs BP 129/71   Pulse 92   Temp 98.8 F (37.1 C) (Oral)   Resp (!) 22   Ht 1.803 m (5\' 11" )  SpO2 99%   BMI 24.97 kg/m   Physical Exam Vitals and nursing note reviewed.  Constitutional:      Appearance: Normal appearance.  HENT:     Head: Normocephalic.     Right Ear: External ear normal.     Left Ear: External ear normal.     Nose: Nose normal.     Mouth/Throat:     Mouth: Mucous membranes are moist.  Eyes:     Pupils: Pupils are equal, round, and reactive to light.  Cardiovascular:     Rate and Rhythm: Normal rate and regular rhythm.     Pulses: Normal pulses.     Heart sounds: Normal heart sounds.  Pulmonary:     Effort: Pulmonary effort is normal.     Breath sounds: Normal breath sounds.     Comments: Left anterior chest with pacemaker pocket.  No tenderness or erythema Abdominal:     General: Abdomen is flat. Bowel sounds are normal. There is no distension.     Palpations: Abdomen is soft.     Tenderness: There is no abdominal tenderness.  Musculoskeletal:        General: Normal range of motion.     Cervical back: Normal range of motion and neck supple.     Comments: Abrasions bilateral knees no tenderness noted Bilateral lower extremities ranged through full active range of motion without tenderness over hips or knees  Skin:    General: Skin is warm and dry.     Capillary Refill: Capillary refill takes less than 2 seconds.  Neurological:     General: No focal deficit present.     Mental Status: He is alert.  Psychiatric:        Mood and Affect: Mood normal.     ED Results / Procedures /  Treatments   Labs (all labs ordered are listed, but only abnormal results are displayed) Labs Reviewed  CBC  COMPREHENSIVE METABOLIC PANEL    EKG None  Radiology DG Chest Port 1 View  Result Date: 04/26/2020 CLINICAL DATA:  Unwitnessed fall, found down. EXAM: PORTABLE CHEST 1 VIEW COMPARISON:  Chest and rib radiographs 10/20/2019 FINDINGS: Left-sided pacemaker in place. Upper normal heart size with unchanged mediastinal contours. Aortic atherosclerosis. The lungs are clear. Pulmonary vasculature is normal. No consolidation, pleural effusion, or pneumothorax. No acute osseous abnormalities are seen. IMPRESSION: No acute chest findings. Electronically Signed   By: Keith Rake M.D.   On: 04/26/2020 20:31    Procedures Procedures (including critical care time)  Medications Ordered in ED Medications - No data to display  ED Course  I have reviewed the triage vital signs and the nursing notes.  Pertinent labs & imaging results that were available during my care of the patient were reviewed by me and considered in my medical decision making (see chart for details).    MDM Rules/Calculators/A&P                          79 year old male with history of Parkinson's dementia who was found down on the ground today.  He seems to indicate that he lost his balance, but is unclear from place and this was interrogated. Final Clinical Impression(s) / ED Diagnoses Final diagnoses:  Fall, initial encounter    Rx / DC Orders ED Discharge Orders    None       Pattricia Boss, MD 04/26/20 2045

## 2020-04-26 NOTE — ED Notes (Signed)
[employer] pace maker interrogated.  

## 2020-04-26 NOTE — ED Triage Notes (Signed)
Pt BIB [facility] from home following unwitnessed fall, found down by wife. Denies hitting head, no thinners, reports hitting left shoulder, no obvious injury. Hx dementia, parkinson's, DM. VS stable, Pt A&Ox3, confused to time at baseline. NAD noted.

## 2020-04-27 ENCOUNTER — Emergency Department (HOSPITAL_COMMUNITY): Payer: Medicare HMO

## 2020-04-27 ENCOUNTER — Telehealth: Payer: Medicare HMO

## 2020-04-27 ENCOUNTER — Other Ambulatory Visit: Payer: Self-pay

## 2020-04-27 ENCOUNTER — Ambulatory Visit: Payer: Self-pay

## 2020-04-27 ENCOUNTER — Inpatient Hospital Stay (HOSPITAL_COMMUNITY)
Admission: EM | Admit: 2020-04-27 | Discharge: 2020-05-11 | DRG: 312 | Disposition: A | Discharge status: Skilled Nursing Facility | Payer: Medicare HMO | Attending: Internal Medicine | Admitting: Internal Medicine

## 2020-04-27 ENCOUNTER — Encounter (HOSPITAL_COMMUNITY): Payer: Self-pay | Admitting: Emergency Medicine

## 2020-04-27 ENCOUNTER — Inpatient Hospital Stay (HOSPITAL_COMMUNITY): Payer: Medicare HMO

## 2020-04-27 DIAGNOSIS — Z8249 Family history of ischemic heart disease and other diseases of the circulatory system: Secondary | ICD-10-CM

## 2020-04-27 DIAGNOSIS — N1831 Chronic kidney disease, stage 3a: Secondary | ICD-10-CM

## 2020-04-27 DIAGNOSIS — I1310 Hypertensive heart and chronic kidney disease without heart failure, with stage 1 through stage 4 chronic kidney disease, or unspecified chronic kidney disease: Secondary | ICD-10-CM | POA: Diagnosis present

## 2020-04-27 DIAGNOSIS — E785 Hyperlipidemia, unspecified: Secondary | ICD-10-CM

## 2020-04-27 DIAGNOSIS — N401 Enlarged prostate with lower urinary tract symptoms: Secondary | ICD-10-CM | POA: Diagnosis present

## 2020-04-27 DIAGNOSIS — F0280 Dementia in other diseases classified elsewhere without behavioral disturbance: Secondary | ICD-10-CM | POA: Diagnosis present

## 2020-04-27 DIAGNOSIS — E860 Dehydration: Secondary | ICD-10-CM | POA: Diagnosis present

## 2020-04-27 DIAGNOSIS — I951 Orthostatic hypotension: Principal | ICD-10-CM | POA: Diagnosis present

## 2020-04-27 DIAGNOSIS — E440 Moderate protein-calorie malnutrition: Secondary | ICD-10-CM | POA: Diagnosis present

## 2020-04-27 DIAGNOSIS — Z950 Presence of cardiac pacemaker: Secondary | ICD-10-CM | POA: Diagnosis not present

## 2020-04-27 DIAGNOSIS — G20 Parkinson's disease: Secondary | ICD-10-CM

## 2020-04-27 DIAGNOSIS — E1121 Type 2 diabetes mellitus with diabetic nephropathy: Secondary | ICD-10-CM | POA: Diagnosis not present

## 2020-04-27 DIAGNOSIS — G20C Parkinsonism, unspecified: Secondary | ICD-10-CM | POA: Diagnosis present

## 2020-04-27 DIAGNOSIS — Z87891 Personal history of nicotine dependence: Secondary | ICD-10-CM

## 2020-04-27 DIAGNOSIS — Z20822 Contact with and (suspected) exposure to covid-19: Secondary | ICD-10-CM | POA: Diagnosis present

## 2020-04-27 DIAGNOSIS — Z7982 Long term (current) use of aspirin: Secondary | ICD-10-CM

## 2020-04-27 DIAGNOSIS — D519 Vitamin B12 deficiency anemia, unspecified: Secondary | ICD-10-CM | POA: Diagnosis present

## 2020-04-27 DIAGNOSIS — E1151 Type 2 diabetes mellitus with diabetic peripheral angiopathy without gangrene: Secondary | ICD-10-CM | POA: Diagnosis present

## 2020-04-27 DIAGNOSIS — R55 Syncope and collapse: Secondary | ICD-10-CM | POA: Diagnosis present

## 2020-04-27 DIAGNOSIS — E1122 Type 2 diabetes mellitus with diabetic chronic kidney disease: Secondary | ICD-10-CM | POA: Diagnosis present

## 2020-04-27 DIAGNOSIS — N179 Acute kidney failure, unspecified: Secondary | ICD-10-CM | POA: Diagnosis present

## 2020-04-27 DIAGNOSIS — R0781 Pleurodynia: Secondary | ICD-10-CM

## 2020-04-27 DIAGNOSIS — E559 Vitamin D deficiency, unspecified: Secondary | ICD-10-CM | POA: Diagnosis present

## 2020-04-27 DIAGNOSIS — R338 Other retention of urine: Secondary | ICD-10-CM | POA: Diagnosis present

## 2020-04-27 DIAGNOSIS — Z833 Family history of diabetes mellitus: Secondary | ICD-10-CM

## 2020-04-27 DIAGNOSIS — N1832 Chronic kidney disease, stage 3b: Secondary | ICD-10-CM | POA: Diagnosis present

## 2020-04-27 DIAGNOSIS — Z6824 Body mass index (BMI) 24.0-24.9, adult: Secondary | ICD-10-CM

## 2020-04-27 DIAGNOSIS — W010XXA Fall on same level from slipping, tripping and stumbling without subsequent striking against object, initial encounter: Secondary | ICD-10-CM | POA: Diagnosis present

## 2020-04-27 DIAGNOSIS — F419 Anxiety disorder, unspecified: Secondary | ICD-10-CM | POA: Diagnosis present

## 2020-04-27 DIAGNOSIS — I361 Nonrheumatic tricuspid (valve) insufficiency: Secondary | ICD-10-CM | POA: Diagnosis not present

## 2020-04-27 DIAGNOSIS — M109 Gout, unspecified: Secondary | ICD-10-CM

## 2020-04-27 DIAGNOSIS — Y92000 Kitchen of unspecified non-institutional (private) residence as  the place of occurrence of the external cause: Secondary | ICD-10-CM

## 2020-04-27 DIAGNOSIS — I442 Atrioventricular block, complete: Secondary | ICD-10-CM | POA: Diagnosis present

## 2020-04-27 DIAGNOSIS — I340 Nonrheumatic mitral (valve) insufficiency: Secondary | ICD-10-CM | POA: Diagnosis not present

## 2020-04-27 DIAGNOSIS — R52 Pain, unspecified: Secondary | ICD-10-CM

## 2020-04-27 DIAGNOSIS — F0391 Unspecified dementia with behavioral disturbance: Secondary | ICD-10-CM

## 2020-04-27 DIAGNOSIS — R748 Abnormal levels of other serum enzymes: Secondary | ICD-10-CM | POA: Diagnosis present

## 2020-04-27 DIAGNOSIS — Z79899 Other long term (current) drug therapy: Secondary | ICD-10-CM

## 2020-04-27 DIAGNOSIS — I10 Essential (primary) hypertension: Secondary | ICD-10-CM | POA: Diagnosis not present

## 2020-04-27 DIAGNOSIS — Z7984 Long term (current) use of oral hypoglycemic drugs: Secondary | ICD-10-CM

## 2020-04-27 DIAGNOSIS — R413 Other amnesia: Secondary | ICD-10-CM

## 2020-04-27 DIAGNOSIS — N183 Chronic kidney disease, stage 3 unspecified: Secondary | ICD-10-CM

## 2020-04-27 DIAGNOSIS — R262 Difficulty in walking, not elsewhere classified: Secondary | ICD-10-CM | POA: Diagnosis present

## 2020-04-27 DIAGNOSIS — R2689 Other abnormalities of gait and mobility: Secondary | ICD-10-CM

## 2020-04-27 LAB — CREATININE, SERUM
Creatinine, Ser: 3.29 mg/dL — ABNORMAL HIGH (ref 0.61–1.24)
GFR calc Af Amer: 20 mL/min — ABNORMAL LOW (ref >60)
GFR calc non Af Amer: 17 mL/min — ABNORMAL LOW (ref >60)

## 2020-04-27 LAB — COMPREHENSIVE METABOLIC PANEL
ALT: 15 U/L (ref 0–44)
AST: 83 U/L — ABNORMAL HIGH (ref 15–41)
Albumin: 3.6 g/dL (ref 3.5–5.0)
Alkaline Phosphatase: 93 U/L (ref 38–126)
Anion gap: 14 (ref 5–15)
BUN: 46 mg/dL — ABNORMAL HIGH (ref 8–23)
CO2: 22 mmol/L (ref 22–32)
Calcium: 9.1 mg/dL (ref 8.9–10.3)
Chloride: 101 mmol/L (ref 98–111)
Creatinine, Ser: 3.47 mg/dL — ABNORMAL HIGH (ref 0.61–1.24)
GFR calc Af Amer: 18 mL/min — ABNORMAL LOW (ref >60)
GFR calc non Af Amer: 16 mL/min — ABNORMAL LOW (ref >60)
Glucose, Bld: 219 mg/dL — ABNORMAL HIGH (ref 70–99)
Potassium: 4.4 mmol/L (ref 3.5–5.1)
Sodium: 137 mmol/L (ref 135–145)
Total Bilirubin: 0.7 mg/dL (ref 0.3–1.2)
Total Protein: 6.9 g/dL (ref 6.5–8.1)

## 2020-04-27 LAB — CBC WITH DIFFERENTIAL/PLATELET
Abs Immature Granulocytes: 0.04 10*3/uL (ref 0.00–0.07)
Basophils Absolute: 0 10*3/uL (ref 0.0–0.1)
Basophils Relative: 0 %
Eosinophils Absolute: 0.1 10*3/uL (ref 0.0–0.5)
Eosinophils Relative: 2 %
HCT: 40 % (ref 39.0–52.0)
Hemoglobin: 12.8 g/dL — ABNORMAL LOW (ref 13.0–17.0)
Immature Granulocytes: 1 %
Lymphocytes Relative: 23 %
Lymphs Abs: 1.8 10*3/uL (ref 0.7–4.0)
MCH: 28.6 pg (ref 26.0–34.0)
MCHC: 32 g/dL (ref 30.0–36.0)
MCV: 89.3 fL (ref 80.0–100.0)
Monocytes Absolute: 1.1 10*3/uL — ABNORMAL HIGH (ref 0.1–1.0)
Monocytes Relative: 14 %
Neutro Abs: 4.6 10*3/uL (ref 1.7–7.7)
Neutrophils Relative %: 60 %
Platelets: 150 10*3/uL (ref 150–400)
RBC: 4.48 MIL/uL (ref 4.22–5.81)
RDW: 12.9 % (ref 11.5–15.5)
WBC: 7.7 10*3/uL (ref 4.0–10.5)
nRBC: 0 % (ref 0.0–0.2)

## 2020-04-27 LAB — MAGNESIUM: Magnesium: 2 mg/dL (ref 1.7–2.4)

## 2020-04-27 LAB — CBC
HCT: 36.1 % — ABNORMAL LOW (ref 39.0–52.0)
Hemoglobin: 11.7 g/dL — ABNORMAL LOW (ref 13.0–17.0)
MCH: 28.6 pg (ref 26.0–34.0)
MCHC: 32.4 g/dL (ref 30.0–36.0)
MCV: 88.3 fL (ref 80.0–100.0)
Platelets: 145 10*3/uL — ABNORMAL LOW (ref 150–400)
RBC: 4.09 MIL/uL — ABNORMAL LOW (ref 4.22–5.81)
RDW: 12.9 % (ref 11.5–15.5)
WBC: 7.2 10*3/uL (ref 4.0–10.5)
nRBC: 0 % (ref 0.0–0.2)

## 2020-04-27 LAB — RAPID URINE DRUG SCREEN, HOSP PERFORMED
Amphetamines: NOT DETECTED
Barbiturates: NOT DETECTED
Benzodiazepines: NOT DETECTED
Cocaine: NOT DETECTED
Opiates: NOT DETECTED
Tetrahydrocannabinol: NOT DETECTED

## 2020-04-27 LAB — PHOSPHORUS: Phosphorus: 3.9 mg/dL (ref 2.5–4.6)

## 2020-04-27 LAB — URINALYSIS, ROUTINE W REFLEX MICROSCOPIC
Bacteria, UA: NONE SEEN
Bilirubin Urine: NEGATIVE
Glucose, UA: 50 mg/dL — AB
Hgb urine dipstick: NEGATIVE
Ketones, ur: 5 mg/dL — AB
Leukocytes,Ua: NEGATIVE
Nitrite: NEGATIVE
Protein, ur: 100 mg/dL — AB
Specific Gravity, Urine: 1.023 (ref 1.005–1.030)
pH: 5 (ref 5.0–8.0)

## 2020-04-27 LAB — GLUCOSE, CAPILLARY: Glucose-Capillary: 187 mg/dL — ABNORMAL HIGH (ref 70–99)

## 2020-04-27 LAB — CK: Total CK: 3228 U/L — ABNORMAL HIGH (ref 49–397)

## 2020-04-27 LAB — TSH: TSH: 1.679 u[IU]/mL (ref 0.350–4.500)

## 2020-04-27 LAB — LACTIC ACID, PLASMA: Lactic Acid, Venous: 1.3 mmol/L (ref 0.5–1.9)

## 2020-04-27 LAB — SARS CORONAVIRUS 2 BY RT PCR (HOSPITAL ORDER, PERFORMED IN ~~LOC~~ HOSPITAL LAB): SARS Coronavirus 2: NEGATIVE

## 2020-04-27 MED ORDER — SODIUM CHLORIDE 0.9 % IV SOLN: INTRAVENOUS | Status: DC

## 2020-04-27 MED ORDER — ONDANSETRON HCL 4 MG PO TABS: 4.0000 mg | ORAL_TABLET | Freq: Four times a day (QID) | ORAL | Status: DC | PRN

## 2020-04-27 MED ORDER — LACTATED RINGERS IV BOLUS
500.0000 mL | Freq: Once | INTRAVENOUS | Status: AC
  Administered 2020-04-27: 500 mL via INTRAVENOUS

## 2020-04-27 MED ORDER — ENOXAPARIN SODIUM 30 MG/0.3ML ~~LOC~~ SOLN
30.0000 mg | SUBCUTANEOUS | Status: DC
  Administered 2020-04-27 – 2020-04-28 (×2): 30 mg via SUBCUTANEOUS
  Filled 2020-04-27 (×2): qty 0.3

## 2020-04-27 MED ORDER — CARBIDOPA-LEVODOPA 25-100 MG PO TABS
1.5000 | ORAL_TABLET | Freq: Three times a day (TID) | ORAL | Status: DC
  Administered 2020-04-27: 1.5 via ORAL
  Filled 2020-04-27 (×2): qty 1.5

## 2020-04-27 MED ORDER — ACETAMINOPHEN 325 MG PO TABS
650.0000 mg | ORAL_TABLET | Freq: Four times a day (QID) | ORAL | Status: DC | PRN
  Administered 2020-04-28 – 2020-04-30 (×5): 650 mg via ORAL
  Filled 2020-04-27 (×5): qty 2

## 2020-04-27 MED ORDER — RIVASTIGMINE 4.6 MG/24HR TD PT24
4.6000 mg | MEDICATED_PATCH | Freq: Every day | TRANSDERMAL | Status: DC
  Administered 2020-04-28 – 2020-05-11 (×15): 4.6 mg via TRANSDERMAL
  Filled 2020-04-27 (×16): qty 1

## 2020-04-27 MED ORDER — ACETAMINOPHEN 650 MG RE SUPP: 650.0000 mg | Freq: Four times a day (QID) | RECTAL | Status: DC | PRN

## 2020-04-27 MED ORDER — ASPIRIN EC 81 MG PO TBEC
81.0000 mg | DELAYED_RELEASE_TABLET | ORAL | Status: DC
  Administered 2020-04-28 – 2020-05-11 (×7): 81 mg via ORAL
  Filled 2020-04-27 (×8): qty 1

## 2020-04-27 MED ORDER — CARBIDOPA-LEVODOPA 25-100 MG PO TABS
1.5000 | ORAL_TABLET | Freq: Three times a day (TID) | ORAL | Status: DC
  Administered 2020-04-28 – 2020-05-11 (×42): 1.5 via ORAL
  Filled 2020-04-27 (×2): qty 1.5
  Filled 2020-04-27 (×3): qty 2
  Filled 2020-04-27 (×3): qty 1.5
  Filled 2020-04-27 (×3): qty 2
  Filled 2020-04-27 (×2): qty 1.5
  Filled 2020-04-27: qty 2
  Filled 2020-04-27 (×2): qty 1.5
  Filled 2020-04-27 (×2): qty 2
  Filled 2020-04-27: qty 1.5
  Filled 2020-04-27 (×2): qty 2
  Filled 2020-04-27 (×2): qty 1.5
  Filled 2020-04-27: qty 2
  Filled 2020-04-27 (×4): qty 1.5
  Filled 2020-04-27: qty 2
  Filled 2020-04-27: qty 1.5
  Filled 2020-04-27 (×4): qty 2
  Filled 2020-04-27: qty 1.5
  Filled 2020-04-27 (×2): qty 2
  Filled 2020-04-27: qty 1.5
  Filled 2020-04-27 (×2): qty 2
  Filled 2020-04-27: qty 1.5
  Filled 2020-04-27: qty 2
  Filled 2020-04-27 (×2): qty 1.5

## 2020-04-27 MED ORDER — INSULIN ASPART 100 UNIT/ML ~~LOC~~ SOLN
0.0000 [IU] | Freq: Three times a day (TID) | SUBCUTANEOUS | Status: DC
  Administered 2020-04-28 (×2): 3 [IU] via SUBCUTANEOUS
  Administered 2020-04-29 (×2): 2 [IU] via SUBCUTANEOUS
  Administered 2020-04-30: 3 [IU] via SUBCUTANEOUS
  Administered 2020-04-30: 8 [IU] via SUBCUTANEOUS
  Administered 2020-05-01: 3 [IU] via SUBCUTANEOUS
  Administered 2020-05-02 – 2020-05-04 (×5): 2 [IU] via SUBCUTANEOUS
  Administered 2020-05-05: 3 [IU] via SUBCUTANEOUS
  Administered 2020-05-06 – 2020-05-10 (×5): 2 [IU] via SUBCUTANEOUS

## 2020-04-27 MED ORDER — PRAVASTATIN SODIUM 10 MG PO TABS
20.0000 mg | ORAL_TABLET | Freq: Every morning | ORAL | Status: DC
  Administered 2020-04-28: 20 mg via ORAL
  Filled 2020-04-27: qty 2

## 2020-04-27 MED ORDER — ONDANSETRON HCL 4 MG/2ML IJ SOLN: 4.0000 mg | Freq: Four times a day (QID) | INTRAMUSCULAR | Status: DC | PRN

## 2020-04-27 MED ORDER — MELATONIN 5 MG PO TABS
10.0000 mg | ORAL_TABLET | Freq: Every day | ORAL | Status: DC
  Administered 2020-04-28 – 2020-05-10 (×14): 10 mg via ORAL
  Filled 2020-04-27 (×15): qty 2

## 2020-04-27 MED ORDER — MEMANTINE HCL 10 MG PO TABS
10.0000 mg | ORAL_TABLET | Freq: Two times a day (BID) | ORAL | Status: DC
  Administered 2020-04-28 – 2020-05-11 (×28): 10 mg via ORAL
  Filled 2020-04-27 (×30): qty 1

## 2020-04-27 MED ORDER — FENTANYL CITRATE (PF) 100 MCG/2ML IJ SOLN: 25.0000 ug | Freq: Once | INTRAMUSCULAR | Status: DC

## 2020-04-27 MED ORDER — INSULIN ASPART 100 UNIT/ML ~~LOC~~ SOLN: 0.0000 [IU] | Freq: Every day | SUBCUTANEOUS | Status: DC

## 2020-04-27 NOTE — ED Notes (Signed)
Called for room x1, no answer.

## 2020-04-27 NOTE — H&P (Signed)
History and Physical    Dylan [name]. YPP:[id number] DOB: [birth year] DOA: 04/27/2020  PCP: [name], MD  Patient coming from: Home  I have personally briefly reviewed patient's old medical records in [place]  Chief Complaint: Physical deconditioning/fall  HPI: Dylan [name]. is a 79 y.o. male with medical history significant of complete heart block status post pacemaker placement, aortic stenosis, Parkinson disease, hypertension, hyperlipidemia, diabetes mellitus, gout, dementia, CKD stage IIIb brought by EMS for the evaluation of body ache/urinary retention.  Patient is poor historian.  Patient's wife at bedside is the historian.  She tells me that patient fell on the ground on Saturday for couple of hours while she was not at home in 1.  She came and found him on the floor.  She brought him to the ER on 6/27.  His pacemaker was interrogated and patient discharged home in stable condition.  Patient wife tells me that patient has decreased appetite, unable to walk/move, he needs 24/7 care which she cannot provide as she is suffering from CHF/arthritis.  She requested for SNF placement.  Reports decreased urinary output since yesterday as well.  No dysuria, foul-smelling urine, back pain. no history of headache, blurry vision, head trauma, seizures, chest pain, shortness of breath, palpitation, fever, chills, nausea, vomiting, diarrhea.  No history of smoking, alcohol, illicit drug use.  ED Course: Upon arrival to ED: Afebrile, tachypneic with respiratory rate in 20s, no leukocytosis, CMP shows worsening kidney function.  CK: 3228, UA, UDS: Negative for infection, CT head, chest x-ray, abdominal x-ray: Negative.CT renal stone study ordered and is pending.  Patient had in and out cath done in ED-215 cc of urine obtained.  His device interrogation was negative.  Patient received IV fluid bolus in ED.  [facility] hospitalist consulted for admission for worsening kidney  function.   Review of Systems: As per HPI otherwise negative.    Past Medical History:  Diagnosis Date  . Anxiety   . Aortic stenosis    mild AS 11/24/16 (peak grad 24, mean grad 11) Dr. [name]  . CHB (complete heart block) ([place]) 07/2017  . 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 Diabetes mellitus without complication ([place])   . Gait abnormality   . Gout   . Hyperlipemia   . Hypertension   . 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 Memory loss   . Peripheral arterial disease ([place])   . 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 Second degree AV block    Wenckebach; no indication for pacemaker as of 12/01/16 (Dr. [name])  . Vitamin B12 deficiency anemia 03/01/2018  . Vitamin D deficiency disease     Past Surgical History:  Procedure Laterality Date  . CARDIOVASCULAR STRESS TEST     11/21/16 Low risk study, EF 52% [facility])  . EYE SURGERY    . KYPHOPLASTY N/A 02/15/2017   Procedure: LUMBAR FOUR KYPHOPLASTY;  Surgeon: Phylliss Bob, MD;  Location: [place];  Service: Orthopedics;  Laterality: N/A;  . lipoma surgery     neck - 30 years ago  . PACEMAKER IMPLANT N/A 07/11/2017   Procedure: Pacemaker Implant;  Surgeon: Constance Haw, MD;  Location: [place] [facility];  Service: Cardiovascular;  Laterality: N/A;  . TRANSTHORACIC ECHOCARDIOGRAM     11/24/16 [facility]): EF 55-60%, mild AS, mild-mod MR, mod TR, moderate pulm HTN, PAP 49 mmHg     reports that he has quit smoking. He has a 3.50 pack-year  smoking history. He has never used smokeless tobacco. He reports that he does not drink alcohol and does not use drugs.  No Known Allergies  Family History  Problem Relation Age of Onset  . Diabetes Mother   . Breast cancer Mother   . Arthritis Mother   . Hypertension Father   . Diabetes Sister   . Diabetes Brother   . Diabetes Maternal Grandmother   . Diabetes Brother   . Diabetes Brother   . Diabetes Brother   . Diabetes  Sister   . Diabetes Sister   . Diabetes Sister     Prior to Admission medications   Medication Sig Start Date End Date Taking? Authorizing Provider  allopurinol (ZYLOPRIM) 100 MG tablet Take 2 tablets (200 mg total) by mouth daily. Patient taking differently: Take 200 mg by mouth every morning.  03/31/20  Yes [name], MD  amLODipine (NORVASC) 5 MG tablet TAKE 1 TABLET BY MOUTH DAILY Patient taking differently: Take 5 mg by mouth every morning.  02/24/20  Yes [name], MD  Ascorbic Acid (VITAMIN C) 1000 MG tablet Take 1,000 mg by mouth every morning.    Yes [provider]  aspirin 81 MG tablet Take 81 mg by mouth every Monday, Wednesday, and Friday.    Yes [provider]  carbidopa-levodopa (SINEMET IR) 25-100 MG tablet Take 1.5 tablets by mouth 3 (three) times daily. Patient taking differently: Take 1.5 tablets by mouth See admin instructions. Take 1 1/2  tablet by mouth three times daily - 10am, 5pm and 10pm 03/24/20  Yes Suzzanne Cloud, NP  carvedilol (COREG) 25 MG tablet Take 1 tablet (25 mg total) by mouth 2 (two) times daily. 12/02/19  Yes Camnitz, Will Hassell Done, MD  Cholecalciferol (VITAMIN D3) 5000 units CAPS Take 5,000 Units by mouth daily with supper.    Yes [provider]  Coenzyme Q10 200 MG capsule Take 200 mg by mouth daily with supper.    Yes [provider]  COLCRYS 0.6 MG tablet Take 1 tablet (0.6 mg total) by mouth daily. Patient taking differently: Take 0.6 mg by mouth daily as needed (gout attacks).  10/12/18  Yes [name], MD  Melatonin 10 MG TABS Take 10 mg by mouth at bedtime.   Yes [provider]  memantine (NAMENDA) 10 MG tablet Take 1 tablet (10 mg total) by mouth 2 (two) times daily. 08/07/19  Yes [name], MD  metFORMIN (GLUCOPHAGE) 1000 MG tablet TAKE 1 TABLET BY MOUTH EVERY DAY Patient taking differently: Take 1,000 mg by mouth every morning.  04/17/20  Yes [name], MD  pravastatin (PRAVACHOL)  20 MG tablet TAKE 1 TABLET BY ORAL ROUTE EVERY DAY Patient taking differently: Take 20 mg by mouth every morning.  02/24/20  Yes [name], MD  rivastigmine (EXELON) 4.6 mg/24hr Place 1 patch (4.6 mg total) onto the skin daily. Patient taking differently: Place 4.6 mg onto the skin every evening.  01/29/20  Yes Marcial Pacas, MD  TRULICITY 1.5 IZ/1.2WP SOPN INJECT 1.5 MG INTO THE SKIN ONCE A WEEK. Patient taking differently: Inject 1.5 mg into the skin every Monday.  09/10/19  Yes [name], MD  valsartan (DIOVAN) 320 MG tablet TAKE 1 TABLET BY MOUTH EVERY DAY Patient taking differently: Take 320 mg by mouth every morning.  08/29/19  Yes [name], MD  diclofenac Sodium (VOLTAREN) 1 % GEL Apply 2 g topically 3 (three) times daily. Patient not taking: Reported on 04/27/2020 10/10/19   Rodriguez-Southworth,  Sunday Spillers, PA-C  glucose blood (ONETOUCH VERIO) test strip Use as instructed to check blood sugars daily dx: e11.22 01/06/20   [name], MD  Lancet Devices (ONE TOUCH DELICA LANCING DEV) MISC Please fill ONE TOUCH DELICA LANCING DEVICE.  Use to test blood sugar twice daily as directed. E11.65 11/15/19   [name], MD  Lancets MISC Please fill basic/generic push button lancets.  Patient to test blood sugar twice daily. DX: E11.65 12/06/19   [name], MD    Physical Exam: Vitals:   04/27/20 1843 04/27/20 1844 04/27/20 1845 04/27/20 1846  BP:   (!) 108/46   Pulse: 75 72 78 73  Resp: (!) 21 20 (!) 22 (!) 22  Temp:      TempSrc:      SpO2: 98% 98% 98% 97%    Constitutional: NAD, calm,, comfortable, alert and oriented x2.  On room air, following commands.  Appears dehydrated. Eyes: PERRL, lids and conjunctivae normal ENMT: Mucous membranes are dry. Posterior pharynx clear of any exudate or lesions.Normal dentition.  Neck: normal, supple, no masses, no thyromegaly Respiratory: clear to auscultation bilaterally, no wheezing, no crackles. Normal respiratory effort. No  accessory muscle use.  Cardiovascular: Regular rate and rhythm, no murmurs / rubs / gallops. No extremity edema. 2+ pedal pulses. No carotid bruits.  Abdomen: Lower abdominal tenderness positive, no guarding, no rigidity, no masses palpated. No hepatosplenomegaly. Bowel sounds positive.  Musculoskeletal: no clubbing / cyanosis. No joint deformity upper and lower extremities. Good ROM, no contractures. Normal muscle tone.  Skin: no rashes, lesions, ulcers. No induration Neurologic: CN 2-12 grossly intact. Sensation intact, DTR normal. Strength 5/5 in all 4.  Psychiatric: Normal judgment and insight. Alert and oriented x 3. Normal mood.    Labs on Admission: I have personally reviewed following labs and imaging studies  CBC: Recent Labs  Lab 04/26/20 1825 04/27/20 1556  WBC 8.4 7.7  NEUTROABS  --  4.6  HGB 14.0 12.8*  HCT 43.1 40.0  MCV 89.0 89.3  PLT 169 169   Basic Metabolic Panel: Recent Labs  Lab 04/26/20 1825 04/27/20 1556  NA 140 137  K 4.6 4.4  CL 103 101  CO2 22 22  GLUCOSE 200* 219*  BUN 21 46*  CREATININE 1.40* 3.47*  CALCIUM 9.5 9.1   GFR: Estimated Creatinine Clearance: 18.7 mL/min (A) (by C-G formula based on SCr of 3.47 mg/dL (H)). Liver Function Tests: Recent Labs  Lab 04/26/20 1825 04/27/20 1556  AST 101* 83*  ALT 38 15  ALKPHOS 103 93  BILITOT 1.3* 0.7  PROT 7.2 6.9  ALBUMIN 3.7 3.6   No results for input(s): LIPASE, AMYLASE in the last 168 hours. No results for input(s): AMMONIA in the last 168 hours. Coagulation Profile: No results for input(s): INR, PROTIME in the last 168 hours. Cardiac Enzymes: Recent Labs  Lab 04/27/20 1556  CKTOTAL 3,228*   BNP (last 3 results) No results for input(s): PROBNP in the last 8760 hours. HbA1C: No results for input(s): HGBA1C in the last 72 hours. CBG: No results for input(s): GLUCAP in the last 168 hours. Lipid Profile: No results for input(s): CHOL, HDL, LDLCALC, TRIG, CHOLHDL, LDLDIRECT in the  last 72 hours. Thyroid Function Tests: No results for input(s): TSH, T4TOTAL, FREET4, T3FREE, THYROIDAB in the last 72 hours. Anemia Panel: No results for input(s): VITAMINB12, FOLATE, FERRITIN, TIBC, IRON, RETICCTPCT in the last 72 hours. Urine analysis:    Component Value Date/Time   COLORURINE AMBER (A) 04/27/2020 1600  APPEARANCEUR HAZY (A) 04/27/2020 1600   LABSPEC 1.023 04/27/2020 1600   PHURINE 5.0 04/27/2020 1600   GLUCOSEU 50 (A) 04/27/2020 1600   HGBUR NEGATIVE 04/27/2020 1600   BILIRUBINUR NEGATIVE 04/27/2020 1600   BILIRUBINUR Negative 01/22/2019 1434   KETONESUR 5 (A) 04/27/2020 1600   PROTEINUR 100 (A) 04/27/2020 1600   UROBILINOGEN 0.2 01/22/2019 1434   NITRITE NEGATIVE 04/27/2020 1600   LEUKOCYTESUR NEGATIVE 04/27/2020 1600    Radiological Exams on Admission: DG Ribs Bilateral W/Chest  Result Date: 04/27/2020 CLINICAL DATA:  Rib pain after fall EXAM: BILATERAL RIBS AND CHEST - 4+ VIEW COMPARISON:  04/26/2020 FINDINGS: Single-view chest demonstrates stable left-sided pacing device. Stable cardiomediastinal silhouette with aortic atherosclerosis. No focal opacity, pleural effusion or pneumothorax. Bilateral rib series demonstrates no acute displaced rib fracture. IMPRESSION: Negative. Electronically Signed   By: Donavan Foil M.D.   On: 04/27/2020 17:02   CT HEAD WO CONTRAST  Result Date: 04/27/2020 CLINICAL DATA:  Head trauma EXAM: CT HEAD WITHOUT CONTRAST TECHNIQUE: Contiguous axial images were obtained from the base of the skull through the vertex without intravenous contrast. COMPARISON:  06/07/2019 FINDINGS: Brain: Mild age related volume loss. No acute intracranial abnormality. Specifically, no hemorrhage, hydrocephalus, mass lesion, acute infarction, or significant intracranial injury. Vascular: No hyperdense vessel or unexpected calcification. Skull: No acute calvarial abnormality. Sinuses/Orbits: Visualized paranasal sinuses and mastoids clear. Orbital soft  tissues unremarkable. Other: None IMPRESSION: No acute intracranial abnormality. Electronically Signed   By: Rolm Baptise M.D.   On: 04/27/2020 17:12   DG Chest Port 1 View  Result Date: 04/26/2020 CLINICAL DATA:  Unwitnessed fall, found down. EXAM: PORTABLE CHEST 1 VIEW COMPARISON:  Chest and rib radiographs 10/20/2019 FINDINGS: Left-sided pacemaker in place. Upper normal heart size with unchanged mediastinal contours. Aortic atherosclerosis. The lungs are clear. Pulmonary vasculature is normal. No consolidation, pleural effusion, or pneumothorax. No acute osseous abnormalities are seen. IMPRESSION: No acute chest findings. Electronically Signed   By: Keith Rake M.D.   On: 04/26/2020 20:31   DG Abd 2 Views  Result Date: 04/27/2020 CLINICAL DATA:  Fall with rib pain EXAM: ABDOMEN - 2 VIEW COMPARISON:  None. FINDINGS: Mild diffuse gaseous prominence of the bowel without obstructive pattern. Moderate stool in the right colon. There is no evidence of free air. No radio-opaque calculi or other significant radiographic abnormality is seen. IMPRESSION: Mild diffuse gaseous prominence of the bowel without obstructive pattern. Electronically Signed   By: Donavan Foil M.D.   On: 04/27/2020 17:05    EKG: Independently reviewed.  Ventricular paced rhythm.  Prolonged QT interval.  No ST elevation or depression noted.  Assessment/Plan Principal Problem:   Syncope Active Problems:   Essential hypertension   Type 2 diabetes mellitus with stage 3a chronic kidney disease, without long-term current use of insulin (HCC)   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HCC)   Acute on chronic kidney failure (HCC)   Parkinsonism (HCC)   Elevated CK   Gout   Hyperlipemia   Syncope: -Differential diagnosis: Orthostatic hypotension, dehydration, arrhythmias, polypharmacy? -Patient is afebrile, no leukocytosis.  UA, UDS: Negative for infection -CT head, chest x-ray, abdominal x-ray: Negative for acute findings. -Admit patient on  the floor.  On med telemetry bed. -Check orthostatic vitals. -Reviewed echo from 06/14/2018 which showed preserved ejection fraction of 60 to 65% with grade 1 diastolic dysfunction. -Check lactic acid.  We will repeat echo. -Start on IV fluids. -PT/OT consulted -On fall precautions  AKI on CKD stage III: -Creatinine: 3.47, GFR:  18 (baseline creatinine: 1.40, GFR: 55) -Likely secondary to dehydration due to decreased p.o. intake. -Hold nephrotoxic medication.  Start on IV fluids. -CT renal stone study is pending -Monitor kidney function closely.  Decreased urinary output: -Likely secondary to dehydration.  Ketones positive in urine -Continue IV fluids.  Strict INO's and daily weight  Hypertension: Blood pressure was low upon arrival to ED -Hold amlodipine, Coreg, valsartan for now -Monitor blood pressure closely.  Resume home BP meds once blood pressure is back to baseline  Elevated CK: -Likely secondary to fall -Continue IV fluids.  Monitor kidney function closely -Repeat CK tomorrow a.m.  Parkinson: Continue Sinemet bowel  Dementia: Continue Namenda and rivastigmine  Gout: Hold allopurinol and colchicine for now due to worsening kidney function  Complete heart block status post pacemaker placement: -Device interrogation was negative in ED.  On telemetry.  Hyperlipidemia: Continue statin  Type 2 diabetes mellitus: Hold Metformin and Trulicity -Check F7P.  Started patient on sliding scale insulin  Physical deconditioning: -Consulted case manager for SNF placement.  DVT prophylaxis: Lovenox/SCD Code Status: Full code-confirmed with the patient and his wife at bedside Family Communication: Patient's wife present at bedside.  Plan of care discussed with patient and his wife at bedside in length and they verbalized understanding and agreed with it. Disposition Plan: Likely home in 2 days Consults called: None Admission status: Inpatient   Mckinley Jewel MD [facility]  If 7PM-7AM, please contact night-coverage [email or web address] Password [email or web address]  04/27/2020, 7:01 PM

## 2020-04-27 NOTE — Chronic Care Management (AMB) (Signed)
°  Chronic Care Management   Outreach Note  04/27/2020 Name: Marquet Faircloth. MRN: [id number] DOB: [birth year]  Referred by: [name], MD Reason for referral : [place] with RN Care Manager regarding patient recent fall resulting in ED visit x 2. Discussion surrounding resources needed to meet the needs of patient.   Follow Up Plan: Scheduled follow up call to the patients caregiver/spouse for July 16.  Daneen Schick, BSW, CDP Social Worker, Certified Dementia Practitioner [place] [facility] [phone]

## 2020-04-27 NOTE — ED Triage Notes (Signed)
Pt arrives via [facility] from home where he had a fall yesterday, was seen in ED after fall and sent home, c/o worsening "soreness" today as well as urinary retention, reports he has not urinated since around 12 yesterday despite being in ED after that time.

## 2020-04-27 NOTE — Care Management (Signed)
ED CM met with patient and spouse at bedside to discuss transitional care plans. Patient and spouse reports increase weakness and falls, patient has history of Parkinson's. Spouse believes patient may benefit from some reconditioning. ED evaluation still in progress. TOC will continue to follow to assist with transitional care plans

## 2020-04-27 NOTE — ED Provider Notes (Signed)
[facility] EMERGENCY DEPARTMENT Provider Note   CSN: [id number] Arrival date & time: 04/27/20  1437     History Chief Complaint  Patient presents with   Fall   Urinary Retention    Dylan Kowalke. is a 79 y.o. male.  The history is provided by the patient and the spouse.        Level 5 caveat due to underlying cognitive deficit which wife states may be dementia or combined with his Parkinson's.  Dylan Sporn. is a 79 y.o. male, with a history of aortic stenosis, complete heart block with pacemaker, DM, HTN, hyperlipidemia, Parkinson disease, presenting to the ED with syncopal episode that occurred sometime this afternoon. Patient's wife states she left the patient at home sometime around 12:30 PM and when she returned 3 to 4 hours later, she found the patient on the floor in the kitchen. Patient states he thinks he fell shortly after his wife left.   He states he was feeling normal and then passed out.  He states he fell toward the stove and broke the glass door the oven.  He endorses feeling "sore all over" and as well as bilateral lateral rib pain. He also states he has not urinated since yesterday. Patient's wife states the only medication changes that have occurred are increased dosage of his carbidopa levodopa, Namenda, and allopurinol.  This change occurred June 17. Denies fever/chills, recent illness, shortness of breath, chest pain, abdominal pain, head pain, neck/back pain, dizziness, recent illness, N/V/D, or any other complaints.    Past Medical History:  Diagnosis Date   Anxiety    Aortic stenosis    mild AS 11/24/16 (peak grad 24, mean grad 11) Dr. 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   CKD (chronic kidney disease), stage III 06/13/2018   Syncope 06/13/2018   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[place]) 07/11/2017   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[place]) 06/23/2015   Peripheral arterial disease ([place]) 05/13/2014   Essential hypertension 05/13/2014   Type 2 diabetes mellitus with stage 3a chronic kidney disease, without long-term current use of insulin ([place]) 05/13/2014   Second degree AV block 05/13/2014    Past Surgical History:  Procedure Laterality Date   CARDIOVASCULAR STRESS TEST     11/21/16 Low risk study, EF 52% [facility])   EYE SURGERY     KYPHOPLASTY N/A 02/15/2017   Procedure: LUMBAR FOUR KYPHOPLASTY;  Surgeon: Phylliss Bob, MD;  Location: [place];  Service: Orthopedics;  Laterality:  N/A;   lipoma surgery     neck - 30 years ago   PACEMAKER IMPLANT N/A 07/11/2017   Procedure: Pacemaker Implant;  Surgeon: Constance Haw, MD;  Location: [facility];  Service: Cardiovascular;  Laterality: N/A;   TRANSTHORACIC ECHOCARDIOGRAM     11/24/16 [facility] CV): EF 55-60%, mild AS, mild-mod MR, mod TR, moderate pulm HTN, PAP 49 mmHg       Family History  Problem Relation Age of Onset   Diabetes Mother    Breast cancer Mother     Arthritis Mother    Hypertension Father    Diabetes Sister    Diabetes Brother    Diabetes Maternal Grandmother    Diabetes Brother    Diabetes Brother    Diabetes Brother    Diabetes Sister    Diabetes Sister    Diabetes Sister     Social History   Tobacco Use   Smoking status: Former Smoker    Packs/day: 0.50    Years: 7.00    Pack years: 3.50   Smokeless tobacco: Never Used   Tobacco comment: quit 35 years  Vaping Use   Vaping Use: Never used  Substance Use Topics   Alcohol use: No   Drug use: No    Home Medications Prior to Admission medications   Medication Sig Start Date End Date Taking? Authorizing Provider  allopurinol (ZYLOPRIM) 100 MG tablet Take 2 tablets (200 mg total) by mouth daily. Patient taking differently: Take 200 mg by mouth every morning.  03/31/20  Yes [name], MD  amLODipine (NORVASC) 5 MG tablet TAKE 1 TABLET BY MOUTH DAILY Patient taking differently: Take 5 mg by mouth every morning.  02/24/20  Yes [name], MD  Ascorbic Acid (VITAMIN C) 1000 MG tablet Take 1,000 mg by mouth every morning.    Yes [provider]  aspirin 81 MG tablet Take 81 mg by mouth every Monday, Wednesday, and Friday.    Yes [provider]  carbidopa-levodopa (SINEMET IR) 25-100 MG tablet Take 1.5 tablets by mouth 3 (three) times daily. Patient taking differently: Take 1.5 tablets by mouth See admin instructions. Take 1 1/2  tablet by mouth three times daily - 10am, 5pm and 10pm 03/24/20  Yes Suzzanne Cloud, NP  carvedilol (COREG) 25 MG tablet Take 1 tablet (25 mg total) by mouth 2 (two) times daily. 12/02/19  Yes Camnitz, Will Hassell Done, MD  Cholecalciferol (VITAMIN D3) 5000 units CAPS Take 5,000 Units by mouth daily with supper.    Yes [provider]  Coenzyme Q10 200 MG capsule Take 200 mg by mouth daily with supper.    Yes [provider]  COLCRYS 0.6 MG tablet Take 1 tablet (0.6 mg total) by mouth  daily. Patient taking differently: Take 0.6 mg by mouth daily as needed (gout attacks).  10/12/18  Yes [name], MD  Melatonin 10 MG TABS Take 10 mg by mouth at bedtime.   Yes [provider]  memantine (NAMENDA) 10 MG tablet Take 1 tablet (10 mg total) by mouth 2 (two) times daily. 08/07/19  Yes [name], MD  metFORMIN (GLUCOPHAGE) 1000 MG tablet TAKE 1 TABLET BY MOUTH EVERY DAY Patient taking differently: Take 1,000 mg by mouth every morning.  04/17/20  Yes [name], MD  pravastatin (PRAVACHOL) 20 MG tablet TAKE 1 TABLET BY ORAL ROUTE EVERY DAY Patient taking differently: Take 20 mg by mouth every morning.  02/24/20  Yes [name], MD  rivastigmine Derek Jack)  4.6 mg/24hr Place 1 patch (4.6 mg total) onto the skin daily. Patient taking differently: Place 4.6 mg onto the skin every evening.  01/29/20  Yes Marcial Pacas, MD  TRULICITY 1.5 CN/4.7SJ SOPN INJECT 1.5 MG INTO THE SKIN ONCE A WEEK. Patient taking differently: Inject 1.5 mg into the skin every Monday.  09/10/19  Yes [name], MD  valsartan (DIOVAN) 320 MG tablet TAKE 1 TABLET BY MOUTH EVERY DAY Patient taking differently: Take 320 mg by mouth every morning.  08/29/19  Yes [name], MD  diclofenac Sodium (VOLTAREN) 1 % GEL Apply 2 g topically 3 (three) times daily. Patient not taking: Reported on 04/27/2020 10/10/19   Rodriguez-Southworth, Sunday Spillers, PA-C  glucose blood [facility] VERIO) test strip Use as instructed to check blood sugars daily dx: e11.22 01/06/20   [name], MD  Lancet Devices (ONE TOUCH DELICA LANCING DEV) MISC Please fill ONE TOUCH DELICA LANCING DEVICE.  Use to test blood sugar twice daily as directed. E11.65 11/15/19   [name], MD  Lancets MISC Please fill basic/generic push button lancets.  Patient to test blood sugar twice daily. DX: E11.65 12/06/19   [name], MD    Allergies    Patient has no known allergies.  Review of Systems   Review of Systems  Constitutional:  Negative for chills, diaphoresis and fever.  Eyes: Negative for visual disturbance.  Respiratory: Negative for cough and shortness of breath.   Cardiovascular: Negative for chest pain and leg swelling.  Gastrointestinal: Negative for abdominal pain, blood in stool, diarrhea, nausea and vomiting.  Genitourinary: Positive for difficulty urinating.  Musculoskeletal: Negative for back pain and neck pain.  Neurological: Positive for syncope. Negative for dizziness, weakness, light-headedness, numbness and headaches.  All other systems reviewed and are negative.   Physical Exam Updated Vital Signs BP (!) 82/71 (BP Location: Left Arm)    Pulse 78    Temp 98.4 F (36.9 C) (Oral)    Resp 16    SpO2 92%   Physical Exam Vitals and nursing note reviewed.  Constitutional:      General: He is not in acute distress.    Appearance: He is well-developed. He is not diaphoretic.  HENT:     Head: Normocephalic.     Comments: Patient does not know whether or not he struck his head.  He denies any pain to the head.  No noted tenderness, deformity, instability, wounds, or other signs of injury to the head or face.    Nose: Nose normal.     Mouth/Throat:     Mouth: Mucous membranes are moist.     Pharynx: Oropharynx is clear.  Eyes:     Conjunctiva/sclera: Conjunctivae normal.  Cardiovascular:     Rate and Rhythm: Normal rate and regular rhythm.     Pulses: Normal pulses.          Radial pulses are 2+ on the right side and 2+ on the left side.       Posterior tibial pulses are 2+ on the right side and 2+ on the left side.     Heart sounds: Normal heart sounds.     Comments: Tactile temperature in the extremities appropriate and equal bilaterally. Pulmonary:     Effort: Pulmonary effort is normal. No respiratory distress.     Breath sounds: Normal breath sounds.  Chest:     Chest wall: Tenderness present. No deformity, swelling or crepitus.    Abdominal:     Palpations: Abdomen is soft.  Tenderness: There is no abdominal tenderness. There is no guarding.     Comments: Some suprapubic firmness, however, patient denies tenderness.  Musculoskeletal:     Cervical back: Neck supple.     Right lower leg: No edema.     Left lower leg: No edema.     Comments: Normal motor function intact in all extremities. No midline spinal tenderness.   Overall trauma exam performed without any abnormalities noted other than those mentioned.  Lymphadenopathy:     Cervical: No cervical adenopathy.  Skin:    General: Skin is warm and dry.     Capillary Refill: Capillary refill takes less than 2 seconds.  Neurological:     Mental Status: He is alert.     Comments: Patient oriented to self, situation, and general location, however, is not clear on the year.  Patient's wife states this is typical for the patient. Sensation to light touch grossly intact throughout each of the extremities and equal bilaterally. Grip strength equal bilaterally. Strength 5/5 in each of the extremities. No noted facial droop. Cranial nerves III through XII grossly intact.  Psychiatric:        Mood and Affect: Mood and affect normal.        Speech: Speech normal.        Behavior: Behavior normal.     ED Results / Procedures / Treatments   Labs (all labs ordered are listed, but only abnormal results are displayed) Labs Reviewed  URINALYSIS, ROUTINE W REFLEX MICROSCOPIC - Abnormal; Notable for the following components:      Result Value   Color, Urine AMBER (*)    APPearance HAZY (*)    Glucose, UA 50 (*)    Ketones, ur 5 (*)    Protein, ur 100 (*)    All other components within normal limits  CBC WITH DIFFERENTIAL/PLATELET - Abnormal; Notable for the following components:   Hemoglobin 12.8 (*)    Monocytes Absolute 1.1 (*)    All other components within normal limits  COMPREHENSIVE METABOLIC PANEL - Abnormal; Notable for the following components:   Glucose, Bld 219 (*)    BUN 46 (*)    Creatinine, Ser  3.47 (*)    AST 83 (*)    GFR calc non Af Amer 16 (*)    GFR calc Af Amer 18 (*)    All other components within normal limits  CK - Abnormal; Notable for the following components:   Total CK 3,228 (*)    All other components within normal limits  URINE CULTURE  SARS CORONAVIRUS 2 BY RT PCR (HOSPITAL ORDER, [facility])  RAPID URINE DRUG SCREEN, HOSP PERFORMED  CBG MONITORING, ED    EKG EKG Interpretation  Date/Time:  Monday April 27 2020 15:50:23 EDT Ventricular Rate:  73 PR Interval:    QRS Duration: 156 QT Interval:  454 QTC Calculation: 501 R Axis:   -130 Text Interpretation: Ventricular-paced complexes No further rhythm analysis attempted due to paced rhythm Consider left ventricular hypertrophy Prolonged QT interval Confirmed by Madalyn Rob [phone]) on 04/27/2020 4:05:37 PM   Radiology DG Ribs Bilateral W/Chest  Result Date: 04/27/2020 CLINICAL DATA:  Rib pain after fall EXAM: BILATERAL RIBS AND CHEST - 4+ VIEW COMPARISON:  04/26/2020 FINDINGS: Single-view chest demonstrates stable left-sided pacing device. Stable cardiomediastinal silhouette with aortic atherosclerosis. No focal opacity, pleural effusion or pneumothorax. Bilateral rib series demonstrates no acute displaced rib fracture. IMPRESSION: Negative. Electronically Signed   By: Maudie Mercury  Francoise Ceo M.D.   On: 04/27/2020 17:02   CT HEAD WO CONTRAST  Result Date: 04/27/2020 CLINICAL DATA:  Head trauma EXAM: CT HEAD WITHOUT CONTRAST TECHNIQUE: Contiguous axial images were obtained from the base of the skull through the vertex without intravenous contrast. COMPARISON:  06/07/2019 FINDINGS: Brain: Mild age related volume loss. No acute intracranial abnormality. Specifically, no hemorrhage, hydrocephalus, mass lesion, acute infarction, or significant intracranial injury. Vascular: No hyperdense vessel or unexpected calcification. Skull: No acute calvarial abnormality. Sinuses/Orbits: Visualized paranasal  sinuses and mastoids clear. Orbital soft tissues unremarkable. Other: None IMPRESSION: No acute intracranial abnormality. Electronically Signed   By: Rolm Baptise M.D.   On: 04/27/2020 17:12   DG Chest Port 1 View  Result Date: 04/26/2020 CLINICAL DATA:  Unwitnessed fall, found down. EXAM: PORTABLE CHEST 1 VIEW COMPARISON:  Chest and rib radiographs 10/20/2019 FINDINGS: Left-sided pacemaker in place. Upper normal heart size with unchanged mediastinal contours. Aortic atherosclerosis. The lungs are clear. Pulmonary vasculature is normal. No consolidation, pleural effusion, or pneumothorax. No acute osseous abnormalities are seen. IMPRESSION: No acute chest findings. Electronically Signed   By: Keith Rake M.D.   On: 04/26/2020 20:31   DG Abd 2 Views  Result Date: 04/27/2020 CLINICAL DATA:  Fall with rib pain EXAM: ABDOMEN - 2 VIEW COMPARISON:  None. FINDINGS: Mild diffuse gaseous prominence of the bowel without obstructive pattern. Moderate stool in the right colon. There is no evidence of free air. No radio-opaque calculi or other significant radiographic abnormality is seen. IMPRESSION: Mild diffuse gaseous prominence of the bowel without obstructive pattern. Electronically Signed   By: Donavan Foil M.D.   On: 04/27/2020 17:05    Procedures Procedures (including critical care time)  Medications Ordered in ED Medications  carbidopa-levodopa (SINEMET IR) 25-100 MG per tablet immediate release 1.5 tablet (has no administration in time range)  lactated ringers bolus 500 mL (500 mLs Intravenous New Bag/Given 04/27/20 1742)    ED Course  I have reviewed the triage vital signs and the nursing notes.  Pertinent labs & imaging results that were available during my care of the patient were reviewed by me and considered in my medical decision making (see chart for details).  Clinical Course as of Apr 28 2119  Mon Apr 27, 2020  1644 Xray tech called. States something in the abdomen "looks not  quite right" and asks if we might get an abdominal xray since patient is already in place.    [SJ]  1821 Spoke with Dr. Doristine Bosworth, hospitalist. Agrees to admit the patient.    [SJ]    Clinical Course User Index [SJ] Lunabella Badgett, Helane Gunther, PA-C   MDM Rules/Calculators/A&P                          Patient presents following a fall and possible syncope. Patient is nontoxic appearing, afebrile, not tachycardic, not tachypneic, maintains excellent SPO2 on room air, and is in no apparent distress.  Recorded episode of hypotension with initial vital signs, however, at the time I saw the patient, he was no longer hypotensive.  I have reviewed the patient's chart to obtain more information.   I reviewed and interpreted the patient's labs and radiological studies.   Patient's pacemaker interrogated without any events noted. No acute abnormalities on patient's imaging studies. Patient does have AKI with elevated creatinine and BUN. Elevated CK. In and out cath was performed due to patient having difficulty with urination.  Noted to have 215  cc of urine in his bladder. Admission for hydration and continued management.  Patient's wife is also stating due to increased frequency of falls, she does not feel she can safely take care of the patient any longer.  She is requesting evaluation for facility placement.  Findings and plan of care discussed with Madalyn Rob, MD. Dr. Roslynn Amble personally evaluated and examined this patient.  Vitals:   04/27/20 1600 04/27/20 1601 04/27/20 1602 04/27/20 1603  BP: (!) 111/54     Pulse: 83 72 78 73  Resp: 19 (!) 21 20 20   Temp:      TempSrc:      SpO2: 100% 97% 98% 98%   Vitals:   04/27/20 1601 04/27/20 1602 04/27/20 1603 04/27/20 1745  BP:    (!) 109/52  Pulse: 72 78 73 74  Resp: (!) 21 20 20  (!) 22  Temp:      TempSrc:      SpO2: 97% 98% 98% 99%     Final Clinical Impression(s) / ED Diagnoses Final diagnoses:  Rib pain  Syncope and collapse  AKI (acute  kidney injury) ([place])  Elevated CK    Rx / DC Orders ED Discharge Orders    None       Layla Maw 04/27/20 2120    Lucrezia Starch, MD 04/27/20 2128

## 2020-04-27 NOTE — Evaluation (Signed)
Physical Therapy Evaluation Patient Details Name: Dylan [name]. MRN: [id number] DOB: 12/02/1940 Today's Date: 04/27/2020   History of Present Illness  Pt is a 79 y/o male presenting to the ED following fall and inability to ambulate. PMH includes Dementia, Parkinson's disease, HTN, DM, CKD, and s/p pacemaker.   Clinical Impression  Pt presenting with problem above and deficits below. Pt requiring max A to perform bed mobility tasks. Pt unable to tolerate further mobility. Per pt's wife, she has been having to provide increased assist to pt since he fell. Reports she cannot safely provide necessary assist, as she has almost hurt herself trying to help pt. Recommend SNF level therapies at d/c. Will continue to follow acutely.     Follow Up Recommendations SNF;Supervision/Assistance - 24 hour    Equipment Recommendations  None recommended by PT    Recommendations for Other Services       Precautions / Restrictions Precautions Precautions: Fall Restrictions Weight Bearing Restrictions: No      Mobility  Bed Mobility Overal bed mobility: Needs Assistance Bed Mobility: Supine to Sit;Sit to Supine     Supine to sit: Max assist Sit to supine: Max assist   General bed mobility comments: Max A for BLE and trunk assist. Increased time to perform. Pt with increased pain in BLE and unable to tolerate standing.   Transfers                 General transfer comment: Unable   Ambulation/Gait                Stairs            Wheelchair Mobility    Modified Rankin (Stroke Patients Only)       Balance Overall balance assessment: Needs assistance Sitting-balance support: No upper extremity supported;Feet supported Sitting balance-Leahy Scale: Fair                                       Pertinent Vitals/Pain Pain Assessment: 0-10 Pain Score: 9  Pain Location: BLE  Pain Descriptors / Indicators: Grimacing;Guarding;Sore Pain  Intervention(s): Limited activity within patient's tolerance;Monitored during session;Repositioned    Home Living Family/patient expects to be discharged to:: Skilled nursing facility                      Prior Function Level of Independence: Independent with assistive device(s)         Comments: Prior to fall, pt's wife reports pt was ambulating with cane vs RW. Since falling, pt's wife has had to assist him with all mobility. Reports it took her 3 hours to get him out of the car.      Hand Dominance        Extremity/Trunk Assessment   Upper Extremity Assessment Upper Extremity Assessment: Defer to OT evaluation;Generalized weakness    Lower Extremity Assessment Lower Extremity Assessment: Generalized weakness;LLE deficits/detail LLE Deficits / Details: Pt unable to perform LAQ in sitting. Pt with extensive extensor lag when performing SLR.     Cervical / Trunk Assessment Cervical / Trunk Assessment: Normal  Communication   Communication: No difficulties  Cognition Arousal/Alertness: Awake/alert Behavior During Therapy: Flat affect Overall Cognitive Status: History of cognitive impairments - at baseline  General Comments: Dementia at baseline       General Comments      Exercises     Assessment/Plan    PT Assessment Patient needs continued PT services  PT Problem List Decreased strength;Decreased range of motion;Decreased balance;Decreased mobility;Decreased coordination;Decreased activity tolerance;Decreased knowledge of use of DME;Decreased knowledge of precautions;Decreased cognition;Decreased safety awareness       PT Treatment Interventions DME instruction;Functional mobility training;Therapeutic activities;Therapeutic exercise;Stair training;Gait training;Balance training;Patient/family education;Cognitive remediation    PT Goals (Current goals can be found in the Care Plan section)  Acute Rehab PT  Goals Patient Stated Goal: to get stronger before going home PT Goal Formulation: With patient Time For Goal Achievement: 05/11/20 Potential to Achieve Goals: Fair    Frequency Min 2X/week   Barriers to discharge        Co-evaluation               AM-PAC PT "6 Clicks" Mobility  Outcome Measure Help needed turning from your back to your side while in a flat bed without using bedrails?: A Lot Help needed moving from lying on your back to sitting on the side of a flat bed without using bedrails?: Total Help needed moving to and from a bed to a chair (including a wheelchair)?: Total Help needed standing up from a chair using your arms (e.g., wheelchair or bedside chair)?: Total Help needed to walk in hospital room?: Total Help needed climbing 3-5 steps with a railing? : Total 6 Click Score: 7    End of Session   Activity Tolerance: Patient limited by pain Patient left: in bed;with call bell/phone within reach;with family/visitor present (on stretcher in ED ) Nurse Communication: Mobility status;Other (comment) (pt's wife asking about pt's meds and dinner) PT Visit Diagnosis: History of falling (Z91.81);Muscle weakness (generalized) (M62.81);Unsteadiness on feet (R26.81)    Time: 8546-2703 PT Time Calculation (min) (ACUTE ONLY): 13 min   Charges:   PT Evaluation $PT Eval Moderate Complexity: 1 Mod          Reuel Derby, PT, DPT  Acute Rehabilitation Services  Pager: [phone] Office: [phone]   Rudean Hitt 04/27/2020, 6:05 PM

## 2020-04-27 NOTE — ED Notes (Signed)
bp rechecked multiple times on both arms, charge aware of low bp in triage

## 2020-04-27 NOTE — ED Notes (Signed)
Called for room x2, no answer.

## 2020-04-28 ENCOUNTER — Inpatient Hospital Stay (HOSPITAL_COMMUNITY): Payer: Medicare HMO

## 2020-04-28 DIAGNOSIS — I361 Nonrheumatic tricuspid (valve) insufficiency: Secondary | ICD-10-CM

## 2020-04-28 DIAGNOSIS — I340 Nonrheumatic mitral (valve) insufficiency: Secondary | ICD-10-CM

## 2020-04-28 LAB — COMPREHENSIVE METABOLIC PANEL
ALT: 11 U/L (ref 0–44)
AST: 74 U/L — ABNORMAL HIGH (ref 15–41)
Albumin: 3 g/dL — ABNORMAL LOW (ref 3.5–5.0)
Alkaline Phosphatase: 77 U/L (ref 38–126)
Anion gap: 10 (ref 5–15)
BUN: 51 mg/dL — ABNORMAL HIGH (ref 8–23)
CO2: 22 mmol/L (ref 22–32)
Calcium: 8.4 mg/dL — ABNORMAL LOW (ref 8.9–10.3)
Chloride: 107 mmol/L (ref 98–111)
Creatinine, Ser: 3.1 mg/dL — ABNORMAL HIGH (ref 0.61–1.24)
GFR calc Af Amer: 21 mL/min — ABNORMAL LOW (ref >60)
GFR calc non Af Amer: 18 mL/min — ABNORMAL LOW (ref >60)
Glucose, Bld: 144 mg/dL — ABNORMAL HIGH (ref 70–99)
Potassium: 3.9 mmol/L (ref 3.5–5.1)
Sodium: 139 mmol/L (ref 135–145)
Total Bilirubin: 0.8 mg/dL (ref 0.3–1.2)
Total Protein: 5.9 g/dL — ABNORMAL LOW (ref 6.5–8.1)

## 2020-04-28 LAB — LACTIC ACID, PLASMA: Lactic Acid, Venous: 1 mmol/L (ref 0.5–1.9)

## 2020-04-28 LAB — GLUCOSE, CAPILLARY
Glucose-Capillary: 94 mg/dL (ref 70–99)
Glucose-Capillary: 164 mg/dL — ABNORMAL HIGH (ref 70–99)
Glucose-Capillary: 157 mg/dL — ABNORMAL HIGH (ref 70–99)
Glucose-Capillary: 111 mg/dL — ABNORMAL HIGH (ref 70–99)

## 2020-04-28 LAB — CK: Total CK: 2382 U/L — ABNORMAL HIGH (ref 49–397)

## 2020-04-28 LAB — CBC
HCT: 34.3 % — ABNORMAL LOW (ref 39.0–52.0)
Hemoglobin: 11.1 g/dL — ABNORMAL LOW (ref 13.0–17.0)
MCH: 28.5 pg (ref 26.0–34.0)
MCHC: 32.4 g/dL (ref 30.0–36.0)
MCV: 87.9 fL (ref 80.0–100.0)
Platelets: 125 10*3/uL — ABNORMAL LOW (ref 150–400)
RBC: 3.9 MIL/uL — ABNORMAL LOW (ref 4.22–5.81)
RDW: 12.8 % (ref 11.5–15.5)
WBC: 6.8 10*3/uL (ref 4.0–10.5)
nRBC: 0 % (ref 0.0–0.2)

## 2020-04-28 LAB — ECHOCARDIOGRAM COMPLETE
Height: 71 in
Weight: 2941.82 oz

## 2020-04-28 NOTE — Progress Notes (Signed)
New Admission Note:  Arrival Method: Stretcher from ED Mental Orientation: A&Ox2 Telemetry: Box # 9 Assessment: Completed Skin: Skin tear on R Knee and R elbow (OTA) IV: PIV R FA with NS at 100cc/hr Pain: 0/10 Tubes: None Safety Measures: Safety Fall Prevention Plan was discussed, Pt high fall risk Admission: Initiated Belongings: Clothing in bag at bedside Unit Orientation: Patient has been orientated to the room, unit, and the staff.  Orders have been reviewed and implemented. Call light has been placed within reach and bed alarm has been activated. Will continue to monitor the patient.  Percell Boston, RN

## 2020-04-28 NOTE — Progress Notes (Signed)
PROGRESS NOTE  Dylan Ina. STM:[id number] DOB: April 24, 1941 DOA: 04/27/2020 PCP: [name], MD  HPI/Recap of past 24 hours:  Dylan Ina. is a 79 y.o. male with medical history significant of complete heart block status post pacemaker placement, aortic stenosis, Parkinson disease, hypertension, hyperlipidemia, diabetes mellitus, gout, dementia, CKD stage IIIb brought by EMS for the evaluation of body ache/urinary retention.  Patient is poor historian.  Patient's wife at bedside is the historian.  She tells me that patient fell on the ground on Saturday for couple of hours while she was not at home in 1.  She came and found him on the floor.  She brought him to the ER on 6/27.  His pacemaker was interrogated and patient discharged home in stable condition.  Patient wife tells me that patient has decreased appetite, unable to walk/move, he needs 24/7 care which she cannot provide as she is suffering from CHF/arthritis.  She requested for SNF placement.  Reports decreased urinary output since yesterday as well.  No dysuria, foul-smelling urine.  ED Course: Upon arrival to ED: Afebrile, tachypneic with respiratory rate in 20s, no leukocytosis, CMP shows worsening kidney function.  CK: 3228, UA, UDS: Negative for infection, CT head, chest x-ray, abdominal x-ray: Negative.CT renal stone study ordered and is pending.  Patient had in and out cath done in ED-215 cc of urine obtained.  His device interrogation was negative.  Patient received IV fluid bolus in ED.  [facility] hospitalist consulted for admission for worsening kidney function.   04/28/20: Seen and examined.  He is alert oriented x1.  Reports he has been falling about 2-3 times per week.  PT has been consulted to assess.  TOC consulted for possible SNF placement.  Assessment/Plan: Principal Problem:   Syncope Active Problems:   Essential hypertension   Type 2 diabetes mellitus with stage 3a chronic kidney disease, without  long-term current use of insulin (HCC)   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HCC)   Acute on chronic kidney failure (HCC)   Parkinsonism (HCC)   Elevated CK   Gout   Hyperlipemia  Syncope: -Differential diagnosis: Orthostatic hypotension, dehydration, arrhythmias, polypharmacy? -Patient is afebrile, no leukocytosis.  UA, UDS: Negative for infection -CT head, chest x-ray, abdominal x-ray: Negative for acute findings. -Check orthostatic vitals. 2D echo completed, awaiting results. Continue IV fluids -PT/OT consulted Continue fall precautions  AKI on CKD stage III likely prerenal in the setting of dehydration with poor oral intake: -Presented with creatinine: 3.47, GFR: 18 (baseline creatinine: 1.40, GFR: 55) -Creatinine downtrending 3.1 Monitor urine output -Likely secondary to dehydration due to decreased p.o. intake. CT renal completed no explanation for AKI Continue to avoid nephrotoxins and hypotension Continue daily BMP  Decreased urinary output: -Likely secondary to dehydration.  Ketones positive in urine -Continue IV fluids.  Strict INO's and daily weight -Obtain bladder scan.    Essential hypertension, blood pressure currently soft Continue to hold off home Norvasc, Coreg and valsartan Continue to monitor blood pressure Maintain MAP greater than 65  Elevated CK: -Likely secondary to fall -Continue IV fluids.  Monitor kidney function closely -Repeat CK tomorrow a.m.  Parkinson: Continue Sinemet bowel  Dementia: Continue Namenda and rivastigmine  Gout: Hold allopurinol and colchicine for now due to worsening kidney function  Complete heart block status post pacemaker placement: -Device interrogation was negative in ED.  On telemetry.  Hyperlipidemia: Continue statin  Type 2 diabetes mellitus: Hold Metformin and Trulicity -Hemoglobin G9Q 7.5 on 03/24/2020.   Continue insulin sliding  scale and avoid hypoglycemia  Physical deconditioning: -Consulted case manager for  SNF placement.  DVT prophylaxis: Lovenox subcu daily/SCD Code Status: Full code-confirmed with the patient and his wife at bedside Family Communication:  None at bedside.    Consults called: None   Status is: Inpatient    Dispo:  Patient From: Home  Planned Disposition: [facility]  Expected discharge date: 04/30/20  Medically stable for discharge: No         Objective: Vitals:   04/28/20 0540 04/28/20 0545 04/28/20 0548 04/28/20 0955  BP:  113/60 113/60 128/71  Pulse:  76 76 73  Resp:  18 18 20   Temp:  98.1 F (36.7 C) 98.1 F (36.7 C) 98.5 F (36.9 C)  TempSrc:    Oral  SpO2:  99% 99% 97%  Weight:   83.4 kg   Height: 5\' 11"  (1.803 m)       Intake/Output Summary (Last 24 hours) at 04/28/2020 1255 Last data filed at 04/28/2020 1248 Gross per 24 hour  Intake 1285 ml  Output 215 ml  Net 1070 ml   Filed Weights   04/28/20 0548  Weight: 83.4 kg    Exam:  . General: 79 y.o. year-old male well developed well nourished in no acute distress.  Alert and oriented x1. . Cardiovascular: Regular rate and rhythm with no rubs or gallops.  No thyromegaly or JVD noted.   [name] Respiratory: Clear to auscultation with no wheezes or rales. Good inspiratory effort. . Abdomen: Soft mildly distended, nontender with normal bowel sounds. . Musculoskeletal: No lower extremity edema.  [name] Psychiatry: Mood is appropriate for condition and setting   Data Reviewed: CBC: Recent Labs  Lab 04/26/20 1825 04/27/20 1556 04/27/20 1855 04/28/20 0021  WBC 8.4 7.7 7.2 6.8  NEUTROABS  --  4.6  --   --   HGB 14.0 12.8* 11.7* 11.1*  HCT 43.1 40.0 36.1* 34.3*  MCV 89.0 89.3 88.3 87.9  PLT 169 150 145* 923*   Basic Metabolic Panel: Recent Labs  Lab 04/26/20 1825 04/27/20 1556 04/27/20 1855 04/28/20 0021  NA 140 137  --  139  K 4.6 4.4  --  3.9  CL 103 101  --  107  CO2 22 22  --  22  GLUCOSE 200* 219*  --  144*  BUN 21 46*  --  51*  CREATININE 1.40* 3.47* 3.29*  3.10*  CALCIUM 9.5 9.1  --  8.4*  MG  --   --  2.0  --   PHOS  --   --  3.9  --    GFR: Estimated Creatinine Clearance: 20.9 mL/min (A) (by C-G formula based on SCr of 3.1 mg/dL (H)). Liver Function Tests: Recent Labs  Lab 04/26/20 1825 04/27/20 1556 04/28/20 0021  AST 101* 83* 74*  ALT 38 15 11  ALKPHOS 103 93 77  BILITOT 1.3* 0.7 0.8  PROT 7.2 6.9 5.9*  ALBUMIN 3.7 3.6 3.0*   No results for input(s): LIPASE, AMYLASE in the last 168 hours. No results for input(s): AMMONIA in the last 168 hours. Coagulation Profile: No results for input(s): INR, PROTIME in the last 168 hours. Cardiac Enzymes: Recent Labs  Lab 04/27/20 1556 04/28/20 0021  CKTOTAL 3,228* 2,382*   BNP (last 3 results) No results for input(s): PROBNP in the last 8760 hours. HbA1C: No results for input(s): HGBA1C in the last 72 hours. CBG: Recent Labs  Lab 04/27/20 2150 04/28/20 0641 04/28/20 1208  GLUCAP 187* 94 164*  Lipid Profile: No results for input(s): CHOL, HDL, LDLCALC, TRIG, CHOLHDL, LDLDIRECT in the last 72 hours. Thyroid Function Tests: Recent Labs    04/27/20 1844  TSH 1.679   Anemia Panel: No results for input(s): VITAMINB12, FOLATE, FERRITIN, TIBC, IRON, RETICCTPCT in the last 72 hours. Urine analysis:    Component Value Date/Time   COLORURINE AMBER (A) 04/27/2020 1600   APPEARANCEUR HAZY (A) 04/27/2020 1600   LABSPEC 1.023 04/27/2020 1600   PHURINE 5.0 04/27/2020 1600   GLUCOSEU 50 (A) 04/27/2020 1600   HGBUR NEGATIVE 04/27/2020 1600   BILIRUBINUR NEGATIVE 04/27/2020 1600   BILIRUBINUR Negative 01/22/2019 1434   KETONESUR 5 (A) 04/27/2020 1600   PROTEINUR 100 (A) 04/27/2020 1600   UROBILINOGEN 0.2 01/22/2019 1434   NITRITE NEGATIVE 04/27/2020 1600   LEUKOCYTESUR NEGATIVE 04/27/2020 1600   Sepsis Labs: @LABRCNTIP (procalcitonin:4,lacticidven:4)  ) Recent Results (from the past 240 hour(s))  SARS Coronavirus 2 by RT PCR (hospital order, performed in [facility]  hospital lab) Nasopharyngeal Nasopharyngeal Swab     Status: None   Collection Time: 04/27/20  5:41 PM   Specimen: Nasopharyngeal Swab  Result Value Ref Range Status   SARS Coronavirus 2 NEGATIVE NEGATIVE Final    Comment: (NOTE) SARS-CoV-2 target nucleic acids are NOT DETECTED.  The SARS-CoV-2 RNA is generally detectable in upper and lower respiratory specimens during the acute phase of infection. The lowest concentration of SARS-CoV-2 viral copies this assay can detect is 250 copies / mL. A negative result does not preclude SARS-CoV-2 infection and should not be used as the sole basis for treatment or other patient management decisions.  A negative result may occur with improper specimen collection / handling, submission of specimen other than nasopharyngeal swab, presence of viral mutation(s) within the areas targeted by this assay, and inadequate number of viral copies (<250 copies / mL). A negative result must be combined with clinical observations, patient history, and epidemiological information.  Fact Sheet for Patients:   StrictlyIdeas.no  Fact Sheet for Healthcare Providers: BankingDealers.co.za  This test is not yet approved or  cleared by the [place] FDA and has been authorized for detection and/or diagnosis of SARS-CoV-2 by FDA under an Emergency Use Authorization (EUA).  This EUA will remain in effect (meaning this test can be used) for the duration of the COVID-19 declaration under Section 564(b)(1) of the Act, 21 U.S.C. section 360bbb-3(b)(1), unless the authorization is terminated or revoked sooner.  Performed at [facility], [place] [address], [place], [place] [address]       Studies: DG Ribs Bilateral W/Chest  Result Date: 04/27/2020 CLINICAL DATA:  Rib pain after fall EXAM: BILATERAL RIBS AND CHEST - 4+ VIEW COMPARISON:  04/26/2020 FINDINGS: Single-view chest demonstrates stable left-sided pacing  device. Stable cardiomediastinal silhouette with aortic atherosclerosis. No focal opacity, pleural effusion or pneumothorax. Bilateral rib series demonstrates no acute displaced rib fracture. IMPRESSION: Negative. Electronically Signed   By: Donavan Foil M.D.   On: 04/27/2020 17:02   CT HEAD WO CONTRAST  Result Date: 04/27/2020 CLINICAL DATA:  Head trauma EXAM: CT HEAD WITHOUT CONTRAST TECHNIQUE: Contiguous axial images were obtained from the base of the skull through the vertex without intravenous contrast. COMPARISON:  06/07/2019 FINDINGS: Brain: Mild age related volume loss. No acute intracranial abnormality. Specifically, no hemorrhage, hydrocephalus, mass lesion, acute infarction, or significant intracranial injury. Vascular: No hyperdense vessel or unexpected calcification. Skull: No acute calvarial abnormality. Sinuses/Orbits: Visualized paranasal sinuses and mastoids clear. Orbital soft tissues unremarkable. Other: None  IMPRESSION: No acute intracranial abnormality. Electronically Signed   By: Rolm Baptise M.D.   On: 04/27/2020 17:12   DG Abd 2 Views  Result Date: 04/27/2020 CLINICAL DATA:  Fall with rib pain EXAM: ABDOMEN - 2 VIEW COMPARISON:  None. FINDINGS: Mild diffuse gaseous prominence of the bowel without obstructive pattern. Moderate stool in the right colon. There is no evidence of free air. No radio-opaque calculi or other significant radiographic abnormality is seen. IMPRESSION: Mild diffuse gaseous prominence of the bowel without obstructive pattern. Electronically Signed   By: Donavan Foil M.D.   On: 04/27/2020 17:05   ECHOCARDIOGRAM COMPLETE  Result Date: 04/28/2020    ECHOCARDIOGRAM REPORT   Patient Name:   Nyal Schachter. Date of Exam: 04/28/2020 Medical Rec #:  [id number]          Height:       71.0 in Accession #:    [id number]         Weight:       183.9 lb Date of Birth:  1941-03-17          BSA:          2.035 m Patient Age:    42 years           BP:           128/71 mmHg  Patient Gender: M                  HR:           73 bpm. Exam Location:  Inpatient Procedure: 2D Echo, Cardiac Doppler and Color Doppler Indications:    Syncope  History:        Patient has prior history of Echocardiogram examinations, most                 recent 06/14/2018. Pacemaker, Aortic Valve Disease,                 Arrythmias:CHB; Risk Factors:Hypertension, Dyslipidemia and                 Diabetes. CKD.  Sonographer:    Dustin Flock Referring Phys: [id number] [place]  1. Left ventricular ejection fraction, by estimation, is 50 to 55%. The left ventricle has low normal function. The left ventricle has no regional wall motion abnormalities. Left ventricular diastolic parameters are consistent with Grade I diastolic dysfunction (impaired relaxation).  2. Right ventricular systolic function is normal. The right ventricular size is normal. There is moderately elevated pulmonary artery systolic pressure.  3. The mitral valve is normal in structure. Mild mitral valve regurgitation.  4. The aortic valve is tricuspid. Aortic valve regurgitation is mild. Mild to moderate aortic valve sclerosis/calcification is present, without any evidence of aortic stenosis. FINDINGS  Left Ventricle: Left ventricular ejection fraction, by estimation, is 50 to 55%. The left ventricle has low normal function. The left ventricle has no regional wall motion abnormalities. The left ventricular internal cavity size was small. There is no left ventricular hypertrophy. Abnormal (paradoxical) septal motion, consistent with RV pacemaker. Left ventricular diastolic parameters are consistent with Grade I diastolic dysfunction (impaired relaxation). Normal left ventricular filling pressure. Right Ventricle: The right ventricular size is normal. No increase in right ventricular wall thickness. Right ventricular systolic function is normal. There is moderately elevated pulmonary artery systolic pressure. The tricuspid  regurgitant velocity is 3.42 m/s, and with an assumed right atrial pressure of 3 mmHg, the estimated right ventricular systolic pressure is  49.8 mmHg. Left Atrium: Left atrial size was normal in size. Right Atrium: Right atrial size was normal in size. Pericardium: There is no evidence of pericardial effusion. Mitral Valve: The mitral valve is normal in structure. Mild mitral valve regurgitation. Tricuspid Valve: The tricuspid valve is normal in structure. Tricuspid valve regurgitation is mild. Aortic Valve: The aortic valve is tricuspid. Aortic valve regurgitation is mild. Aortic regurgitation PHT measures 683 msec. Mild to moderate aortic valve sclerosis/calcification is present, without any evidence of aortic stenosis. Aortic valve mean gradient measures 9.4 mmHg. Aortic valve peak gradient measures 17.6 mmHg. Aortic valve area, by VTI measures 2.08 cm. Pulmonic Valve: The pulmonic valve was not well visualized. Pulmonic valve regurgitation is not visualized. Aorta: The aortic root is normal in size and structure. IAS/Shunts: No atrial level shunt detected by color flow Doppler. Additional Comments: A pacer wire is visualized.  LEFT VENTRICLE PLAX 2D LVIDd:         4.90 cm  Diastology LVIDs:         3.30 cm  LV e' lateral:   7.18 cm/s LV PW:         1.00 cm  LV E/e' lateral: 8.2 LV IVS:        1.00 cm  LV e' medial:    5.44 cm/s LVOT diam:     2.20 cm  LV E/e' medial:  10.9 LV SV:         94 LV SV Index:   46 LVOT Area:     3.80 cm  RIGHT VENTRICLE RV Basal diam:  2.90 cm RV S prime:     10.70 cm/s TAPSE (M-mode): 2.6 cm LEFT ATRIUM              Index       RIGHT ATRIUM           Index LA diam:        3.30 cm  1.62 cm/m  RA Area:     18.90 cm LA Vol (A2C):   60.6 ml  29.79 ml/m RA Volume:   53.90 ml  26.49 ml/m LA Vol (A4C):   113.0 ml 55.54 ml/m LA Biplane Vol: 82.9 ml  40.75 ml/m  AORTIC VALVE AV Area (Vmax):    1.87 cm AV Area (Vmean):   1.91 cm AV Area (VTI):     2.08 cm AV Vmax:           209.64  cm/s AV Vmean:          144.890 cm/s AV VTI:            0.449 m AV Peak Grad:      17.6 mmHg AV Mean Grad:      9.4 mmHg LVOT Vmax:         103.00 cm/s LVOT Vmean:        72.900 cm/s LVOT VTI:          0.246 m LVOT/AV VTI ratio: 0.55 AI PHT:            683 msec  AORTA Ao Root diam: 2.80 cm MITRAL VALVE                TRICUSPID VALVE MV Area (PHT): 5.66 cm     TR Peak grad:   46.8 mmHg MV Decel Time: 134 msec     TR Vmax:        342.00 cm/s MV E velocity: 59.10 cm/s MV A velocity: 116.00 cm/s  SHUNTS MV  E/A ratio:  0.51         Systemic VTI:  0.25 m                             Systemic Diam: 2.20 cm Sanda Klein MD Electronically signed by Sanda Klein MD Signature Date/Time: 04/28/2020/11:55:34 AM    Final    CT Renal Stone Study  Result Date: 04/27/2020 CLINICAL DATA:  Acute renal failure.  Fall yesterday. EXAM: CT ABDOMEN AND PELVIS WITHOUT CONTRAST TECHNIQUE: Multidetector CT imaging of the abdomen and pelvis was performed following the standard protocol without IV contrast. COMPARISON:  Radiograph earlier today. FINDINGS: Lower chest: No basilar pneumothorax or focal airspace disease. Pacemaker partially included. Coronary artery calcifications. No pleural fluid. Remote posterior left eighth rib fracture. No evidence of acute fracture of lower ribs. Hepatobiliary: Few coarse calcifications in the central dome of the liver. No evidence of focal lesion on noncontrast exam. No evidence of perihepatic hematoma. Gallbladder physiologically distended, no calcified stone. No biliary dilatation. Pancreas: No ductal dilatation or inflammation. Spleen: Normal in size without focal abnormality. Adrenals/Urinary Tract: Mild left greater than right adrenal thickening without dominant nodule. There is no hydronephrosis. Minimal symmetric perinephric edema. No renal calculi. No obvious focal renal mass on noncontrast exam. Both ureters are decompressed. Urinary bladder is partially distended. No bladder wall  thickening. Stomach/Bowel: Small hiatal hernia. Stomach is decompressed. Normal positioning of the duodenum and ligament of Treitz. There is no small bowel obstruction or inflammation. Normal appendix. Large volume of colonic stool, greater in the right colon. Significant tortuosity of the transverse and sigmoid colon. Mild gaseous distension of tortuous sigmoid colon without wall thickening or inflammation. No significant diverticular disease. Vascular/Lymphatic: Aortic atherosclerosis. Moderate branch atherosclerosis. There is no portal venous or mesenteric gas. No bulky abdominopelvic adenopathy. Reproductive: Enlarged prostate gland spanning 5 cm transverse. Other: No free air, free fluid, or intra-abdominal fluid collection. Musculoskeletal: Remote L4 compression fracture with vertebroplasty. Remote minor L3 superior endplate compression fracture. Minimally displaced fracture through the anterior inferior aspect of L1 vertebral body appears acute. Trace loss of height inferiorly. There is no posterior element involvement. There is trabecular coarsening involving the right iliac bone suggestive of underlying Paget's. Degenerative change of both hips. IMPRESSION: 1. No explanation for acute renal failure. 2. Minimally displaced fracture through the anterior inferior aspect of L1 vertebral body appears acute. Trace loss of height inferiorly. No posterior element involvement. Recommend correlation for back pain/focal tenderness given recent fall. 3. Remote L4 and L3 compression fractures. 4. Large volume of colonic stool, greater in the right colon, can be seen with constipation. Diffuse colonic tortuosity. 5. Enlarged prostate gland. Aortic Atherosclerosis (ICD10-I70.0). Electronically Signed   By: Keith Rake M.D.   On: 04/27/2020 19:26    Scheduled Meds: . aspirin EC  81 mg Oral Q M,W,F  . carbidopa-levodopa  1.5 tablet Oral TID  . enoxaparin (LOVENOX) injection  30 mg Subcutaneous Q24H  . fentaNYL  (SUBLIMAZE) injection  25 mcg Intravenous Once  . insulin aspart  0-15 Units Subcutaneous TID WC  . insulin aspart  0-5 Units Subcutaneous QHS  . melatonin  10 mg Oral QHS  . memantine  10 mg Oral BID  . pravastatin  20 mg Oral q morning - 10a  . rivastigmine  4.6 mg Transdermal Daily    Continuous Infusions: . sodium chloride 100 mL/hr at 04/28/20 1049     LOS: 1 day  Kayleen Memos, MD [facility] Pager [phone]  If 7PM-7AM, please contact night-coverage [email or web address] Password [facility] 04/28/2020, 12:55 PM

## 2020-04-28 NOTE — Progress Notes (Signed)
Occupational Therapy Evaluation Patient Details Name: Dylan [name]. MRN: [id number] DOB: Aug 30, 1941 Today's Date: 04/28/2020    History of Present Illness Pt is a 79 y/o male presenting to the ED following fall and inability to ambulate. PMH includes Dementia, Parkinson's disease, HTN, DM, CKD, and s/p pacemaker.    Clinical Impression   Patient lives at home with wife and prior to fall patient was independent with assistive devices.  He typically uses a cane, though recently wife having increased difficulty assist patient with mobility.  Patient has dementia at baseline.  Today patient becoming emotional during session about causing a burden to his wife.  He is very motivated to get stronger.  He was able to complete bed mobility with mod assist today and stand with min assist and RW.  Transferred to chair with min assist and verbal cueing for safety/sequencing.  Requires set up for UB ADLs while seated and mod assist with LB ADLs.  Patient will need increased care before returning home as patient's wife is struggling with level of support.  Will continue to follow with OT acutely to address the deficits listed below.      Follow Up Recommendations  SNF;Supervision/Assistance - 24 hour    Equipment Recommendations  Other (comment) (defer to next venue)    Recommendations for Other Services       Precautions / Restrictions Precautions Precautions: Fall Restrictions Weight Bearing Restrictions: No      Mobility Bed Mobility Overal bed mobility: Needs Assistance Bed Mobility: Supine to Sit     Supine to sit: Mod assist     General bed mobility comments: Assist with trunk   Transfers Overall transfer level: Needs assistance Equipment used: Rolling walker (2 wheeled) Transfers: Sit to/from [facility] Sit to Stand: Min assist;From elevated surface Stand pivot transfers: Min assist;From elevated surface       General transfer comment: Bed slightly  elevated    Balance Overall balance assessment: Needs assistance Sitting-balance support: No upper extremity supported;Feet supported Sitting balance-Leahy Scale: Fair     Standing balance support: Bilateral upper extremity supported;During functional activity Standing balance-Leahy Scale: Poor                             ADL either performed or assessed with clinical judgement   ADL Overall ADL's : Needs assistance/impaired Eating/Feeding: Independent;Sitting   Grooming: Set up;Sitting   Upper Body Bathing: Set up;Sitting   Lower Body Bathing: Sit to/from stand;Moderate assistance   Upper Body Dressing : Set up;Sitting   Lower Body Dressing: Moderate assistance;Sit to/from stand   Toilet Transfer: Minimal assistance;Stand-pivot   Toileting- Clothing Manipulation and Hygiene: Moderate assistance;Sit to/from stand       Functional mobility during ADLs: Minimal assistance;Rolling walker       Vision         Perception     Praxis      Pertinent Vitals/Pain Pain Assessment: Faces Faces Pain Scale: Hurts little more Pain Location: BLE - "thighs" Pain Descriptors / Indicators: Grimacing;Guarding Pain Intervention(s): Limited activity within patient's tolerance;Monitored during session;Repositioned     Hand Dominance     Extremity/Trunk Assessment Upper Extremity Assessment Upper Extremity Assessment: Generalized weakness   Lower Extremity Assessment Lower Extremity Assessment: Defer to PT evaluation       Communication Communication Communication: No difficulties   Cognition Arousal/Alertness: Awake/alert Behavior During Therapy: Flat affect Overall Cognitive Status: History of cognitive impairments - at baseline  General Comments: Dementia at baselien.  Patient able to follow simple commands consistently and answer questions.  Not oriented to time but was to situation.  He became emotional  after transfer to chair about being afraid his mobility was getting bad.  Crying because he knows his wife is struggling caring for him   General Comments  BP 128/71    Exercises     Shoulder Instructions      Home Living Family/patient expects to be discharged to:: Skilled nursing facility Living Arrangements: Spouse/significant other                               Additional Comments: At home patient states it is a one level house, 3 steps to get inside.  Has a walk in shower with chair.        Prior Functioning/Environment Level of Independence: Independent with assistive device(s)        Comments: Prior to fall, pt's wife reports pt was ambulating with cane vs RW. Since falling, pt's wife has had to assist him with all mobility.         OT Problem List: Decreased strength;Decreased activity tolerance;Impaired balance (sitting and/or standing);Decreased cognition      OT Treatment/Interventions: Self-care/ADL training;Therapeutic exercise;Energy conservation;Therapeutic activities;Cognitive remediation/compensation;Patient/family education;Balance training    OT Goals(Current goals can be found in the care plan section) Acute Rehab OT Goals Patient Stated Goal: to get stronger before going home OT Goal Formulation: With patient Time For Goal Achievement: 05/12/20 Potential to Achieve Goals: Good  OT Frequency: Min 2X/week   Barriers to D/C:            Co-evaluation              AM-PAC OT "6 Clicks" Daily Activity     Outcome Measure Help from another person eating meals?: None Help from another person taking care of personal grooming?: A Little Help from another person toileting, which includes using toliet, bedpan, or urinal?: A Lot Help from another person bathing (including washing, rinsing, drying)?: A Lot Help from another person to put on and taking off regular upper body clothing?: A Little Help from another person to put on and taking  off regular lower body clothing?: A Lot 6 Click Score: 16   End of Session Equipment Utilized During Treatment: Gait belt;Rolling walker Nurse Communication: Mobility status  Activity Tolerance: Patient tolerated treatment well Patient left: in chair;with call bell/phone within reach;with chair alarm set  OT Visit Diagnosis: Unsteadiness on feet (R26.81);History of falling (Z91.81);Muscle weakness (generalized) (M62.81);Other symptoms and signs involving cognitive function;Pain Pain - Right/Left:  (both) Pain - part of body: Leg                Time: 0258-5277 OT Time Calculation (min): 28 min Charges:  OT General Charges $OT Visit: 1 Visit OT Evaluation $OT Eval Moderate Complexity: 1 Mod OT Treatments $Self Care/Home Management : 8-22 mins  August Luz, OTR/L   Phylliss Bob 04/28/2020, 3:27 PM

## 2020-04-28 NOTE — Progress Notes (Signed)
  Echocardiogram 2D Echocardiogram has been performed.  Dylan [name] 04/28/2020, 11:43 AM

## 2020-04-29 ENCOUNTER — Telehealth: Payer: Medicare HMO

## 2020-04-29 DIAGNOSIS — I10 Essential (primary) hypertension: Secondary | ICD-10-CM

## 2020-04-29 DIAGNOSIS — N179 Acute kidney failure, unspecified: Secondary | ICD-10-CM

## 2020-04-29 LAB — BASIC METABOLIC PANEL
Anion gap: 11 (ref 5–15)
BUN: 21 mg/dL (ref 8–23)
CO2: 20 mmol/L — ABNORMAL LOW (ref 22–32)
Calcium: 9.1 mg/dL (ref 8.9–10.3)
Chloride: 107 mmol/L (ref 98–111)
Creatinine, Ser: 1.12 mg/dL (ref 0.61–1.24)
GFR calc Af Amer: >60 mL/min (ref >60)
GFR calc non Af Amer: >60 mL/min (ref >60)
Glucose, Bld: 169 mg/dL — ABNORMAL HIGH (ref 70–99)
Potassium: 4.3 mmol/L (ref 3.5–5.1)
Sodium: 138 mmol/L (ref 135–145)

## 2020-04-29 LAB — GLUCOSE, CAPILLARY
Glucose-Capillary: 97 mg/dL (ref 70–99)
Glucose-Capillary: 129 mg/dL — ABNORMAL HIGH (ref 70–99)
Glucose-Capillary: 141 mg/dL — ABNORMAL HIGH (ref 70–99)
Glucose-Capillary: 105 mg/dL — ABNORMAL HIGH (ref 70–99)

## 2020-04-29 LAB — URINE CULTURE: Culture: NO GROWTH

## 2020-04-29 MED ORDER — ENOXAPARIN SODIUM 40 MG/0.4ML ~~LOC~~ SOLN
40.0000 mg | SUBCUTANEOUS | Status: DC
  Administered 2020-04-29 – 2020-05-11 (×13): 40 mg via SUBCUTANEOUS
  Filled 2020-04-29 (×12): qty 0.4

## 2020-04-29 NOTE — Progress Notes (Signed)
Physical Therapy Treatment Patient Details Name: Dylan [name]. MRN: [id number] DOB: 07-Mar-1941 Today's Date: 04/29/2020    History of Present Illness Pt is a 79 y/o male presenting to the ED following fall and inability to ambulate. PMH includes Dementia, Parkinson's disease, HTN, DM, CKD, and s/p pacemaker.     PT Comments    Pt remains limited overall secondary to weakness, fatigue and pain. He continues to require physical assistance for bed mobility, transfers and very short distance ambulation with use of RW. Pt would continue to benefit from skilled physical therapy services at this time while admitted and after d/c to address the below listed limitations in order to improve overall safety and independence with functional mobility.    Follow Up Recommendations  SNF;Supervision/Assistance - 24 hour     Equipment Recommendations  None recommended by PT    Recommendations for Other Services       Precautions / Restrictions Precautions Precautions: Fall Restrictions Weight Bearing Restrictions: No    Mobility  Bed Mobility Overal bed mobility: Needs Assistance Bed Mobility: Supine to Sit     Supine to sit: Mod assist     General bed mobility comments: assistance needed for trunk elevation  Transfers Overall transfer level: Needs assistance Equipment used: Rolling walker (2 wheeled) Transfers: Sit to/from Stand Sit to Stand: Min assist;+2 physical assistance         General transfer comment: cueing for safe hand placement, assistance needed to power into standing from EOB and for stability  Ambulation/Gait Ambulation/Gait assistance: Min assist;+2 safety/equipment Gait Distance (Feet): 10 Feet Assistive device: Rolling walker (2 wheeled) Gait Pattern/deviations: Step-to pattern;Decreased step length - right;Decreased step length - left;Decreased stride length;Shuffle;Festinating Gait velocity: decreased   General Gait Details: pt with short, shuffling  steps and required constant cueing and assistance with RW management and to maintain proximity to [employer]    Modified Rankin (Stroke Patients Only)       Balance Overall balance assessment: Needs assistance Sitting-balance support: No upper extremity supported;Feet supported Sitting balance-Leahy Scale: Fair     Standing balance support: Bilateral upper extremity supported;During functional activity Standing balance-Leahy Scale: Poor                              Cognition Arousal/Alertness: Awake/alert Behavior During Therapy: Flat affect Overall Cognitive Status: History of cognitive impairments - at baseline                                 General Comments: Dementia at baseline. Patient able to follow simple commands consistently and answer questions.  Not oriented to time but was to situation.  He became emotional after transfer to chair about being afraid his mobility was getting bad.  Crying because he knows his wife is struggling caring for him      Exercises      General Comments        Pertinent Vitals/Pain Pain Assessment: Faces Faces Pain Scale: Hurts little more Pain Location: bilateral hips Pain Descriptors / Indicators: Sore Pain Intervention(s): Monitored during session;Repositioned    Home Living                      Prior Function  PT Goals (current goals can now be found in the care plan section) Acute Rehab PT Goals PT Goal Formulation: With patient Time For Goal Achievement: 05/11/20 Potential to Achieve Goals: Fair Progress towards PT goals: Progressing toward goals    Frequency    Min 2X/week      PT Plan Current plan remains appropriate    Co-evaluation              AM-PAC PT "6 Clicks" Mobility   Outcome Measure  Help needed turning from your back to your side while in a flat bed without using bedrails?: A Lot Help needed moving  from lying on your back to sitting on the side of a flat bed without using bedrails?: A Lot Help needed moving to and from a bed to a chair (including a wheelchair)?: A Lot Help needed standing up from a chair using your arms (e.g., wheelchair or bedside chair)?: A Lot Help needed to walk in hospital room?: A Little Help needed climbing 3-5 steps with a railing? : Total 6 Click Score: 12    End of Session Equipment Utilized During Treatment: Gait belt Activity Tolerance: Patient tolerated treatment well Patient left: in chair;with call bell/phone within reach;with chair alarm set Nurse Communication: Mobility status PT Visit Diagnosis: Other abnormalities of gait and mobility (R26.89)     Time: 0034-9179 PT Time Calculation (min) (ACUTE ONLY): 18 min  Charges:  $Therapeutic Activity: 8-22 mins                     Anastasio Champion, DPT  Acute Rehabilitation Services Pager [phone] Office [place] 04/29/2020, 3:26 PM

## 2020-04-29 NOTE — Chronic Care Management (AMB) (Signed)
Chronic Care Management   Follow Up Note   04/27/2020 Name: Dylan [name]. MRN: [id number] DOB: 01/31/1941  Referred by: [name], MD Reason for referral : Chronic Care Management (FU RN CM inbound call from spouse Dylan [name] )   Dylan [name]. is a 79 y.o. year old male who is a primary care patient of [name], MD. The CCM team was consulted for assistance with chronic disease management and care coordination needs.    Review of patient status, including review of consultants reports, relevant laboratory and other test results, and collaboration with appropriate care team members and the patient's provider was performed as part of comprehensive patient evaluation and provision of chronic care management services.    SDOH (Social Determinants of Health) assessments performed: No See Care Plan activities for detailed interventions related to [place])   Received inbound call from spouse Dylan [name] stating Dylan [name] had a fall at home yesterday and is not doing well.     No facility-administered encounter medications on file as of 04/27/2020.   Outpatient Encounter Medications as of 04/27/2020  Medication Sig Note  . allopurinol (ZYLOPRIM) 100 MG tablet Take 2 tablets (200 mg total) by mouth daily. (Patient taking differently: Take 200 mg by mouth every morning. )   . amLODipine (NORVASC) 5 MG tablet TAKE 1 TABLET BY MOUTH DAILY (Patient taking differently: Take 5 mg by mouth every morning. )   . Ascorbic Acid (VITAMIN C) 1000 MG tablet Take 1,000 mg by mouth every morning.    [name] aspirin 81 MG tablet Take 81 mg by mouth every Monday, Wednesday, and Friday.    . carbidopa-levodopa (SINEMET IR) 25-100 MG tablet Take 1.5 tablets by mouth 3 (three) times daily. (Patient taking differently: Take 1.5 tablets by mouth See admin instructions. Take 1 1/2  tablet by mouth three times daily - 10am, 5pm and 10pm)   . carvedilol (COREG) 25 MG tablet Take 1 tablet (25 mg total) by mouth  2 (two) times daily.   . Cholecalciferol (VITAMIN D3) 5000 units CAPS Take 5,000 Units by mouth daily with supper.    . Coenzyme Q10 200 MG capsule Take 200 mg by mouth daily with supper.    . COLCRYS 0.6 MG tablet Take 1 tablet (0.6 mg total) by mouth daily. (Patient taking differently: Take 0.6 mg by mouth daily as needed (gout attacks). )   . diclofenac Sodium (VOLTAREN) 1 % GEL Apply 2 g topically 3 (three) times daily. (Patient not taking: Reported on 04/27/2020)   . 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 Lancet Devices (ONE TOUCH DELICA LANCING DEV) MISC Please fill ONE TOUCH DELICA LANCING DEVICE.  Use to test blood sugar twice daily as directed. E11.65   . Lancets MISC Please fill basic/generic push button lancets.  Patient to test blood sugar twice daily. DX: E11.65   . Melatonin 10 MG TABS Take 10 mg by mouth at bedtime.   . memantine (NAMENDA) 10 MG tablet Take 1 tablet (10 mg total) by mouth 2 (two) times daily.   . metFORMIN (GLUCOPHAGE) 1000 MG tablet TAKE 1 TABLET BY MOUTH EVERY DAY (Patient taking differently: Take 1,000 mg by mouth every morning. )   . pravastatin (PRAVACHOL) 20 MG tablet TAKE 1 TABLET BY ORAL ROUTE EVERY DAY (Patient taking differently: Take 20 mg by mouth every morning. )   . rivastigmine (EXELON) 4.6 mg/24hr Place 1 patch (4.6 mg  total) onto the skin daily. (Patient taking differently: Place 4.6 mg onto the skin every evening. ) 04/27/2020: Current patch is on back of right shoulder  . TRULICITY 1.5 PX/1.0GY SOPN INJECT 1.5 MG INTO THE SKIN ONCE A WEEK. (Patient taking differently: Inject 1.5 mg into the skin every Monday. )   . valsartan (DIOVAN) 320 MG tablet TAKE 1 TABLET BY MOUTH EVERY DAY (Patient taking differently: Take 320 mg by mouth every morning. )      Objective:  Lab Results  Component Value Date   HGBA1C 7.5 (H) 03/24/2020   HGBA1C 8.1 (H) 12/23/2019   HGBA1C 10.5 (H) 09/19/2019    Lab Results  Component Value Date   MICROALBUR 80 01/22/2019   LDLCALC 66 07/24/2019   CREATININE 1.12 04/29/2020   BP Readings from Last 3 Encounters:  04/29/20 135/74  04/26/20 111/61  04/21/20 126/80    Goals Addressed      Patient Stated   .  "I need help with Dylan [name]...he can't move today" (pt-stated)        CARE PLAN ENTRY (see longitudinal plan of care for additional care plan information)  Current Barriers:  [name] Knowledge Deficits related to etiology and treatment management for worsening symptoms of weakness, anuria, immobility, muscle rigidity with recent fall and ED visit  . Chronic Disease Management support and education needs related to Type II DM, non-insulin dependent, CKD, stage III, Essential hypertension, Dementia, Parkinsonism   Nurse Case Manager Clinical Goal(s):  [name] Over the next 24 hours, patient will follow up on recommendations to return to his local ED for evaluation and treatment for worsening symptoms of weakness, anuria, immobility, and muscle rigidity  CCM RN CM Interventions:  04/27/20 Inbound call completed with spouse Dylan [name] . Inter-disciplinary care team collaboration (see longitudinal plan of care) . Evaluation of current treatment plan related to gait disturbance with recent fall and ED visit and patient's adherence to plan as established by provider . Determined patient had a fall while at home alone on Sunday afternoon while his spouse ran out to pick up dinner, she is unsure what may have caused the fall . Determined spouse called EMS upon her arrival home after finding Dylan [name] had fallen, he was evaluated and d/c with recommendations to f/u with his PCP . Determined upon arriving home from the ED, Dylan [name] had great difficulty getting Dylan [name] out of their car and in his home due to his inability to stand and or walk . Determined today, he continues to show worsening symptoms of immobility, he cannot stand, walk, turn or shift  his weight, and he has not urinated since yesterday afternoon despite taking in what Dylan [name] considers to be "enough fluids" . Assessed for s/s suggestive of Stroke, patient is able to follow simple commands, spouse reports no evidence of facial drooping or a symmetry with limb strength . Collaborated with PCP provider Dr. [name] regarding Mr. Yandow condition and reported concerns from spouse . Instructed spouse Dylan [name] to call EMS to have Mr. Dimmick re-evaluated in the ED . Educated spouse on potential triggers that may worsen PD symptoms such as AKI, UTI, dehydration and or other infection, and or missing doses of Carbidopa/Levodopa  . Determined Mr. Carandang did miss his afternoon dose of Carbidopa/Levodopa yesterday afternoon, in addition, Mr. Pembleton inadvertently took a double dose of his am medications Thursday morning at which time Dylan [name] called poison control and followed their advise . Instructed Dylan [name] to make the  ED RN/MD aware of this mishap and to take all his medications with her to the ED, she verbalizes understanding and is agreeable  . Discussed plans with patient for ongoing care management follow up and provided patient with direct contact information for care management team  Patient Self Care Activities:  . Attends all scheduled provider appointments . Calls provider office for new concerns or questions . Unable to self administer medications as prescribed . Unable to perform ADLs independently . Unable to perform IADLs independently . Supportive spouse to assist with care needs   Initial goal documentation       Plan:   The care management team will monitor for patient discharge and follow up with the patient and his spouse upon his discharge home to assess for CCM needs.    [name], RN, BSN, CCM Care Management Coordinator [facility]  Direct Phone: [phone]

## 2020-04-29 NOTE — Progress Notes (Signed)
Progress Note    Dylan Ina.  BDZ:[id number] DOB: [birth year]  DOA: 04/27/2020 PCP: [name], MD    Brief Narrative:     Medical records reviewed and are as summarized below:  Dylan Ina. is an 79 y.o. 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  Patient is poor historian. Patient's wife at bedside is the historian. She tells me that patient fell on the ground on Saturday for couple of hours while she was not at home in 1. She came and found him on the floor. She brought him to the ER on 6/27.His pacemaker was interrogated and patient discharged home in stable condition.  Patient wife tells me that patient has decreased appetite, unable to walk/move, he needs 24/7 care which she cannot provide as she is suffering from CHF/arthritis. She requested for SNF placement.  Assessment/Plan:   Principal Problem:   Syncope Active Problems:   Essential hypertension   Type 2 diabetes mellitus with stage 3a chronic kidney disease, without long-term current use of insulin (HCC)   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HCC)   Acute on chronic kidney failure (HCC)   Parkinsonism (HCC)   Elevated CK   Gout   Hyperlipemia   Syncope: -Patient is afebrile, no leukocytosis. UA, UDS: Negative for infection -CT head, chest x-ray, abdominal x-ray: Negative for acute findings. -Suspect related to hypotension and volume status  AKI on CKD stage III likely prerenal in the setting of dehydration with poor oral intake: -Presented with creatinine: 3.47, GFR: 18 -Creatinine downtrending  to normal -Likely secondary to dehydration due to decreased p.o. intake. Continue to avoid nephrotoxins and hypotension  Decreased urinary output: Appears to have 5 L  documented output yesterday -Likely secondary to dehydration.  -Needs strict I's and O's  Essential hypertension, blood pressure currently soft -Hold home blood pressure medications  Elevated CK: -Likely secondary to fall -Trending down  Parkinson:  -Continue Sinemet -Makes him higher fall risk  Dementia:  -Continue Namenda and rivastigmine  Complete heart block status post pacemaker placement: -Device interrogation was negative in ED -d/c tele  Hyperlipidemia: Continue statin  Type 2 diabetes mellitus: Hold Metformin and Trulicity -Hemoglobin T4H 7.5 on 03/24/2020.  Continue insulin sliding scale and avoid hypoglycemia  Physical deconditioning: -Consulted case manager for SNF placement.   Family Communication/Anticipated D/C date and plan/Code Status   DVT prophylaxis: Lovenox ordered. Code Status: Full Code.   Disposition Plan: Status is: Inpatient  Remains inpatient appropriate because:Unsafe d/c plan   Dispo:  Patient From: Home  Planned Disposition: [place]  Expected discharge date: when bed available  Medically stable for discharge: No- needs SNF     Medical Consultants:    None.    Subjective:   Needs help moving in the bed  Objective:    Vitals:   04/28/20 1636 04/28/20 2043 04/29/20 0442 04/29/20 0900  BP: (!) 147/73 (!) 142/65 130/72 135/74  Pulse: 79 65 75 79  Resp: 16 17 17 18   Temp: 98.1 F (36.7 C) 98.1 F (36.7 C) 98.1 F (36.7 C) 98 F (36.7 C)  TempSrc:    Oral  SpO2: 100% 100% 98% 97%  Weight:   85.4 kg   Height:        Intake/Output Summary (Last 24 hours) at 04/29/2020 1312 Last data filed at 04/29/2020 1100  Gross per 24 hour  Intake 4082.77 ml  Output 4975 ml  Net -892.23 ml   Filed Weights   04/28/20 0548 04/29/20 0442  Weight: 83.4 kg 85.4 kg    Exam:  General: Appearance:     Overweight male in no acute distress     Lungs:     Clear to auscultation bilaterally, respirations  unlabored  Heart:    Normal heart rate. Normal rhythm. No murmurs, rubs, or gallops.   MS:   All extremities are intact.   Neurologic:   Awake, alert, generalized weakness    Data Reviewed:   I have personally reviewed following labs and imaging studies:  Labs: Labs show the following:   Basic Metabolic Panel: Recent Labs  Lab 04/26/20 1825 04/26/20 1825 04/27/20 1556 04/27/20 1556 04/27/20 1855 04/28/20 0021 04/29/20 1105  NA 140  --  137  --   --  139 138  K 4.6   < > 4.4   < >  --  3.9 4.3  CL 103  --  101  --   --  107 107  CO2 22  --  22  --   --  22 20*  GLUCOSE 200*  --  219*  --   --  144* 169*  BUN 21  --  46*  --   --  51* 21  CREATININE 1.40*  --  3.47*  --  3.29* 3.10* 1.12  CALCIUM 9.5  --  9.1  --   --  8.4* 9.1  MG  --   --   --   --  2.0  --   --   PHOS  --   --   --   --  3.9  --   --    < > = values in this interval not displayed.   GFR Estimated Creatinine Clearance: 57.9 mL/min (by C-G formula based on SCr of 1.12 mg/dL). Liver Function Tests: Recent Labs  Lab 04/26/20 1825 04/27/20 1556 04/28/20 0021  AST 101* 83* 74*  ALT 38 15 11  ALKPHOS 103 93 77  BILITOT 1.3* 0.7 0.8  PROT 7.2 6.9 5.9*  ALBUMIN 3.7 3.6 3.0*   No results for input(s): LIPASE, AMYLASE in the last 168 hours. No results for input(s): AMMONIA in the last 168 hours. Coagulation profile No results for input(s): INR, PROTIME in the last 168 hours.  CBC: Recent Labs  Lab 04/26/20 1825 04/27/20 1556 04/27/20 1855 04/28/20 0021  WBC 8.4 7.7 7.2 6.8  NEUTROABS  --  4.6  --   --   HGB 14.0 12.8* 11.7* 11.1*  HCT 43.1 40.0 36.1* 34.3*  MCV 89.0 89.3 88.3 87.9  PLT 169 150 145* 125*   Cardiac Enzymes: Recent Labs  Lab 04/27/20 1556 04/28/20 0021  CKTOTAL 3,228* 2,382*   BNP (last 3 results) No results for input(s): PROBNP in the last 8760 hours. CBG: Recent Labs  Lab 04/28/20 1208 04/28/20 1637 04/28/20 2044 04/29/20 0641 04/29/20 1117  GLUCAP 164* 157*  111* 97 129*   D-Dimer: No results for input(s): DDIMER in the last 72 hours. Hgb A1c: No results for input(s): HGBA1C in the last 72 hours. Lipid Profile: No results for input(s): CHOL, HDL, LDLCALC, TRIG, CHOLHDL, LDLDIRECT in the last 72 hours. Thyroid function studies: Recent Labs    04/27/20 1844  TSH 1.679   Anemia work up: No results for input(s): VITAMINB12, FOLATE, FERRITIN, TIBC, IRON, RETICCTPCT in the last 72 hours. Sepsis Labs:  Recent Labs  Lab 04/26/20 1825 04/27/20 1556 04/27/20 1855 04/27/20 1950 04/28/20 0021  WBC 8.4 7.7 7.2  --  6.8  LATICACIDVEN  --   --   --  1.3 1.0    Microbiology Recent Results (from the past 240 hour(s))  Urine culture     Status: None   Collection Time: 04/27/20  4:04 PM   Specimen: Urine, Random  Result Value Ref Range Status   Specimen Description URINE, RANDOM  Final   Special Requests NONE  Final   Culture   Final    NO GROWTH Performed at [facility], [place] [address] [place], [place] [address]    Report Status 04/29/2020 FINAL  Final  SARS Coronavirus 2 by RT PCR (hospital order, performed in [facility] hospital lab) Nasopharyngeal Nasopharyngeal Swab     Status: None   Collection Time: 04/27/20  5:41 PM   Specimen: Nasopharyngeal Swab  Result Value Ref Range Status   SARS Coronavirus 2 NEGATIVE NEGATIVE Final    Comment: (NOTE) SARS-CoV-2 target nucleic acids are NOT DETECTED.  The SARS-CoV-2 RNA is generally detectable in upper and lower respiratory specimens during the acute phase of infection. The lowest concentration of SARS-CoV-2 viral copies this assay can detect is 250 copies / mL. A negative result does not preclude SARS-CoV-2 infection and should not be used as the sole basis for treatment or other patient management decisions.  A negative result may occur with improper specimen collection / handling, submission of specimen other than nasopharyngeal swab, presence of viral mutation(s) within  the areas targeted by this assay, and inadequate number of viral copies (<250 copies / mL). A negative result must be combined with clinical observations, patient history, and epidemiological information.  Fact Sheet for Patients:   StrictlyIdeas.no  Fact Sheet for Healthcare Providers: BankingDealers.co.za  This test is not yet approved or  cleared by the [place] FDA and has been authorized for detection and/or diagnosis of SARS-CoV-2 by FDA under an Emergency Use Authorization (EUA).  This EUA will remain in effect (meaning this test can be used) for the duration of the COVID-19 declaration under Section 564(b)(1) of the Act, 21 U.S.C. section 360bbb-3(b)(1), unless the authorization is terminated or revoked sooner.  Performed at [facility], [place] [address] [place], [place] [address]     Procedures and diagnostic studies:  DG Ribs Bilateral W/Chest  Result Date: 04/27/2020 CLINICAL DATA:  Rib pain after fall EXAM: BILATERAL RIBS AND CHEST - 4+ VIEW COMPARISON:  04/26/2020 FINDINGS: Single-view chest demonstrates stable left-sided pacing device. Stable cardiomediastinal silhouette with aortic atherosclerosis. No focal opacity, pleural effusion or pneumothorax. Bilateral rib series demonstrates no acute displaced rib fracture. IMPRESSION: Negative. Electronically Signed   By: Donavan Foil M.D.   On: 04/27/2020 17:02   CT HEAD WO CONTRAST  Result Date: 04/27/2020 CLINICAL DATA:  Head trauma EXAM: CT HEAD WITHOUT CONTRAST TECHNIQUE: Contiguous axial images were obtained from the base of the skull through the vertex without intravenous contrast. COMPARISON:  06/07/2019 FINDINGS: Brain: Mild age related volume loss. No acute intracranial abnormality. Specifically, no hemorrhage, hydrocephalus, mass lesion, acute infarction, or significant intracranial injury. Vascular: No hyperdense vessel or unexpected calcification. Skull: No  acute calvarial abnormality. Sinuses/Orbits: Visualized paranasal sinuses and mastoids clear. Orbital soft tissues unremarkable. Other: None IMPRESSION: No acute intracranial abnormality. Electronically Signed   By: Rolm Baptise M.D.   On: 04/27/2020 17:12   DG Abd 2 Views  Result Date: 04/27/2020  CLINICAL DATA:  Fall with rib pain EXAM: ABDOMEN - 2 VIEW COMPARISON:  None. FINDINGS: Mild diffuse gaseous prominence of the bowel without obstructive pattern. Moderate stool in the right colon. There is no evidence of free air. No radio-opaque calculi or other significant radiographic abnormality is seen. IMPRESSION: Mild diffuse gaseous prominence of the bowel without obstructive pattern. Electronically Signed   By: Donavan Foil M.D.   On: 04/27/2020 17:05   ECHOCARDIOGRAM COMPLETE  Result Date: 04/28/2020    ECHOCARDIOGRAM REPORT   Patient Name:   Jahmeir Geisen. Date of Exam: 04/28/2020 Medical Rec #:  [id number]          Height:       71.0 in Accession #:    [id number]         Weight:       183.9 lb Date of Birth:  1940-11-29          BSA:          2.035 m Patient Age:    59 years           BP:           128/71 mmHg Patient Gender: M                  HR:           73 bpm. Exam Location:  Inpatient Procedure: 2D Echo, Cardiac Doppler and Color Doppler Indications:    Syncope  History:        Patient has prior history of Echocardiogram examinations, most                 recent 06/14/2018. Pacemaker, Aortic Valve Disease,                 Arrythmias:CHB; Risk Factors:Hypertension, Dyslipidemia and                 Diabetes. CKD.  Sonographer:    Dustin Flock Referring Phys: [id number] [place]  1. Left ventricular ejection fraction, by estimation, is 50 to 55%. The left ventricle has low normal function. The left ventricle has no regional wall motion abnormalities. Left ventricular diastolic parameters are consistent with Grade I diastolic dysfunction (impaired relaxation).  2. Right  ventricular systolic function is normal. The right ventricular size is normal. There is moderately elevated pulmonary artery systolic pressure.  3. The mitral valve is normal in structure. Mild mitral valve regurgitation.  4. The aortic valve is tricuspid. Aortic valve regurgitation is mild. Mild to moderate aortic valve sclerosis/calcification is present, without any evidence of aortic stenosis. FINDINGS  Left Ventricle: Left ventricular ejection fraction, by estimation, is 50 to 55%. The left ventricle has low normal function. The left ventricle has no regional wall motion abnormalities. The left ventricular internal cavity size was small. There is no left ventricular hypertrophy. Abnormal (paradoxical) septal motion, consistent with RV pacemaker. Left ventricular diastolic parameters are consistent with Grade I diastolic dysfunction (impaired relaxation). Normal left ventricular filling pressure. Right Ventricle: The right ventricular size is normal. No increase in right ventricular wall thickness. Right ventricular systolic function is normal. There is moderately elevated pulmonary artery systolic pressure. The tricuspid regurgitant velocity is 3.42 m/s, and with an assumed right atrial pressure of 3 mmHg, the estimated right ventricular systolic pressure is 22.0 mmHg. Left Atrium: Left atrial size was normal in size. Right Atrium: Right atrial size was normal in size. Pericardium: There is no evidence of pericardial effusion. Mitral Valve:  The mitral valve is normal in structure. Mild mitral valve regurgitation. Tricuspid Valve: The tricuspid valve is normal in structure. Tricuspid valve regurgitation is mild. Aortic Valve: The aortic valve is tricuspid. Aortic valve regurgitation is mild. Aortic regurgitation PHT measures 683 msec. Mild to moderate aortic valve sclerosis/calcification is present, without any evidence of aortic stenosis. Aortic valve mean gradient measures 9.4 mmHg. Aortic valve peak gradient  measures 17.6 mmHg. Aortic valve area, by VTI measures 2.08 cm. Pulmonic Valve: The pulmonic valve was not well visualized. Pulmonic valve regurgitation is not visualized. Aorta: The aortic root is normal in size and structure. IAS/Shunts: No atrial level shunt detected by color flow Doppler. Additional Comments: A pacer wire is visualized.  LEFT VENTRICLE PLAX 2D LVIDd:         4.90 cm  Diastology LVIDs:         3.30 cm  LV e' lateral:   7.18 cm/s LV PW:         1.00 cm  LV E/e' lateral: 8.2 LV IVS:        1.00 cm  LV e' medial:    5.44 cm/s LVOT diam:     2.20 cm  LV E/e' medial:  10.9 LV SV:         94 LV SV Index:   46 LVOT Area:     3.80 cm  RIGHT VENTRICLE RV Basal diam:  2.90 cm RV S prime:     10.70 cm/s TAPSE (M-mode): 2.6 cm LEFT ATRIUM              Index       RIGHT ATRIUM           Index LA diam:        3.30 cm  1.62 cm/m  RA Area:     18.90 cm LA Vol (A2C):   60.6 ml  29.79 ml/m RA Volume:   53.90 ml  26.49 ml/m LA Vol (A4C):   113.0 ml 55.54 ml/m LA Biplane Vol: 82.9 ml  40.75 ml/m  AORTIC VALVE AV Area (Vmax):    1.87 cm AV Area (Vmean):   1.91 cm AV Area (VTI):     2.08 cm AV Vmax:           209.64 cm/s AV Vmean:          144.890 cm/s AV VTI:            0.449 m AV Peak Grad:      17.6 mmHg AV Mean Grad:      9.4 mmHg LVOT Vmax:         103.00 cm/s LVOT Vmean:        72.900 cm/s LVOT VTI:          0.246 m LVOT/AV VTI ratio: 0.55 AI PHT:            683 msec  AORTA Ao Root diam: 2.80 cm MITRAL VALVE                TRICUSPID VALVE MV Area (PHT): 5.66 cm     TR Peak grad:   46.8 mmHg MV Decel Time: 134 msec     TR Vmax:        342.00 cm/s MV E velocity: 59.10 cm/s MV A velocity: 116.00 cm/s  SHUNTS MV E/A ratio:  0.51         Systemic VTI:  0.25 m  Systemic Diam: 2.20 cm Sanda Klein MD Electronically signed by Sanda Klein MD Signature Date/Time: 04/28/2020/11:55:34 AM    Final    CT Renal Stone Study  Result Date: 04/27/2020 CLINICAL DATA:  Acute renal  failure.  Fall yesterday. EXAM: CT ABDOMEN AND PELVIS WITHOUT CONTRAST TECHNIQUE: Multidetector CT imaging of the abdomen and pelvis was performed following the standard protocol without IV contrast. COMPARISON:  Radiograph earlier today. FINDINGS: Lower chest: No basilar pneumothorax or focal airspace disease. Pacemaker partially included. Coronary artery calcifications. No pleural fluid. Remote posterior left eighth rib fracture. No evidence of acute fracture of lower ribs. Hepatobiliary: Few coarse calcifications in the central dome of the liver. No evidence of focal lesion on noncontrast exam. No evidence of perihepatic hematoma. Gallbladder physiologically distended, no calcified stone. No biliary dilatation. Pancreas: No ductal dilatation or inflammation. Spleen: Normal in size without focal abnormality. Adrenals/Urinary Tract: Mild left greater than right adrenal thickening without dominant nodule. There is no hydronephrosis. Minimal symmetric perinephric edema. No renal calculi. No obvious focal renal mass on noncontrast exam. Both ureters are decompressed. Urinary bladder is partially distended. No bladder wall thickening. Stomach/Bowel: Small hiatal hernia. Stomach is decompressed. Normal positioning of the duodenum and ligament of Treitz. There is no small bowel obstruction or inflammation. Normal appendix. Large volume of colonic stool, greater in the right colon. Significant tortuosity of the transverse and sigmoid colon. Mild gaseous distension of tortuous sigmoid colon without wall thickening or inflammation. No significant diverticular disease. Vascular/Lymphatic: Aortic atherosclerosis. Moderate branch atherosclerosis. There is no portal venous or mesenteric gas. No bulky abdominopelvic adenopathy. Reproductive: Enlarged prostate gland spanning 5 cm transverse. Other: No free air, free fluid, or intra-abdominal fluid collection. Musculoskeletal: Remote L4 compression fracture with vertebroplasty.  Remote minor L3 superior endplate compression fracture. Minimally displaced fracture through the anterior inferior aspect of L1 vertebral body appears acute. Trace loss of height inferiorly. There is no posterior element involvement. There is trabecular coarsening involving the right iliac bone suggestive of underlying Paget's. Degenerative change of both hips. IMPRESSION: 1. No explanation for acute renal failure. 2. Minimally displaced fracture through the anterior inferior aspect of L1 vertebral body appears acute. Trace loss of height inferiorly. No posterior element involvement. Recommend correlation for back pain/focal tenderness given recent fall. 3. Remote L4 and L3 compression fractures. 4. Large volume of colonic stool, greater in the right colon, can be seen with constipation. Diffuse colonic tortuosity. 5. Enlarged prostate gland. Aortic Atherosclerosis (ICD10-I70.0). Electronically Signed   By: Keith Rake M.D.   On: 04/27/2020 19:26    Medications:   . aspirin EC  81 mg Oral Q M,W,F  . carbidopa-levodopa  1.5 tablet Oral TID  . enoxaparin (LOVENOX) injection  30 mg Subcutaneous Q24H  . fentaNYL (SUBLIMAZE) injection  25 mcg Intravenous Once  . insulin aspart  0-15 Units Subcutaneous TID WC  . insulin aspart  0-5 Units Subcutaneous QHS  . melatonin  10 mg Oral QHS  . memantine  10 mg Oral BID  . rivastigmine  4.6 mg Transdermal Daily   Continuous Infusions: . sodium chloride 100 mL/hr at 04/29/20 1100     LOS: 2 days   Geradine Girt  [facility]   How to contact the [facility] Attending or Consulting provider [place] or covering provider during after hours [place], for this patient?  1. Check the care team in [facility] and look for a) attending/consulting [facility] provider listed and b) the [facility] team listed 2. Log into  [email or web address] and use [place]'s universal password to access. If you do not have the password, please contact the hospital operator. 3. Locate the [facility] provider  you are looking for under [facility] and page to a number that you can be directly reached. 4. If you still have difficulty reaching the provider, please page the [facility] (Director on Call) for the Hospitalists listed on amion for assistance.  04/29/2020, 1:12 PM

## 2020-04-29 NOTE — Patient Instructions (Signed)
Visit Information  Goals Addressed      Patient Stated     "I need help with Mr. Balthazor...he can't move today" (pt-stated)        CARE PLAN ENTRY (see longitudinal plan of care for additional care plan information)  Current Barriers:   Knowledge Deficits related to etiology and treatment management for worsening symptoms of weakness, anuria, immobility, muscle rigidity with recent fall and ED visit   Chronic Disease Management support and education needs related to Type II DM, non-insulin dependent, CKD, stage III, Essential hypertension, Dementia, Parkinsonism   Nurse Case Manager Clinical Goal(s):   Over the next 24 hours, patient will follow up on recommendations to return to his local ED for evaluation and treatment for worsening symptoms of weakness, anuria, immobility, and muscle rigidity  CCM RN CM Interventions:  04/27/20 Inbound call completed with spouse Ancil Linsey care team collaboration (see longitudinal plan of care)  Evaluation of current treatment plan related to gait disturbance with recent fall and ED visit and patient's adherence to plan as established by provider  Determined patient had a fall while at home alone on Sunday afternoon while his spouse ran out to pick up dinner, she is unsure what may have caused the fall  Determined spouse called EMS upon her arrival home after finding Mr. Fernandez had fallen, he was evaluated and d/c with recommendations to f/u with his PCP  Determined upon arriving home from the ED, [name] had great difficulty getting Mr. Paparella out of their car and in his home due to his inability to stand and or walk  Determined today, he continues to show worsening symptoms of immobility, he cannot stand, walk, turn or shift his weight, and he has not urinated since yesterday afternoon despite taking in what [name] considers to be "enough fluids"  Assessed for s/s suggestive of Stroke, patient is able to follow  simple commands, spouse reports no evidence of facial drooping or a symmetry with limb strength  Collaborated with PCP provider Dr. [name] regarding Mr. Glogowski condition and reported concerns from spouse  Instructed spouse [name] to call EMS to have Mr. Mika re-evaluated in the ED  Educated spouse on potential triggers that may worsen PD symptoms such as AKI, UTI, dehydration and or other infection, and or missing doses of Carbidopa/Levodopa   Determined Mr. Morici did miss his afternoon dose of Carbidopa/Levodopa yesterday afternoon, in addition, Mr. Arabie inadvertently took a double dose of his am medications Thursday morning at which time [name] called poison control and followed their advise  Instructed [name] to make the ED RN/MD aware of this mishap and to take all his medications with her to the ED, she verbalizes understanding and is agreeable   Discussed plans with patient for ongoing care management follow up and provided patient with direct contact information for care management team  Patient Self Care Activities:   Attends all scheduled provider appointments  Calls provider office for new concerns or questions  Unable to self administer medications as prescribed  Unable to perform ADLs independently  Unable to perform IADLs independently  Supportive spouse to assist with care needs   Initial goal documentation       Patient verbalizes understanding of instructions provided today.   Telephone follow up appointment with care management team member scheduled for: 05/18/20  [name], RN, BSN, CCM Care Management Coordinator [facility]  Direct Phone: [phone]

## 2020-04-29 NOTE — Plan of Care (Signed)
  Problem: Safety: Goal: Ability to remain free from injury will improve Outcome: Progressing   

## 2020-04-30 ENCOUNTER — Ambulatory Visit (INDEPENDENT_AMBULATORY_CARE_PROVIDER_SITE_OTHER): Payer: Medicare HMO

## 2020-04-30 ENCOUNTER — Telehealth: Payer: Medicare HMO

## 2020-04-30 DIAGNOSIS — I10 Essential (primary) hypertension: Secondary | ICD-10-CM

## 2020-04-30 DIAGNOSIS — N1831 Chronic kidney disease, stage 3a: Secondary | ICD-10-CM

## 2020-04-30 DIAGNOSIS — N183 Chronic kidney disease, stage 3 unspecified: Secondary | ICD-10-CM

## 2020-04-30 DIAGNOSIS — R413 Other amnesia: Secondary | ICD-10-CM

## 2020-04-30 DIAGNOSIS — E1122 Type 2 diabetes mellitus with diabetic chronic kidney disease: Secondary | ICD-10-CM

## 2020-04-30 LAB — GLUCOSE, CAPILLARY
Glucose-Capillary: 112 mg/dL — ABNORMAL HIGH (ref 70–99)
Glucose-Capillary: 157 mg/dL — ABNORMAL HIGH (ref 70–99)
Glucose-Capillary: 292 mg/dL — ABNORMAL HIGH (ref 70–99)
Glucose-Capillary: 81 mg/dL (ref 70–99)

## 2020-04-30 MED ORDER — DICLOFENAC SODIUM 1 % EX GEL
2.0000 g | Freq: Three times a day (TID) | CUTANEOUS | Status: DC
  Administered 2020-04-30 – 2020-05-11 (×31): 2 g via TOPICAL
  Filled 2020-04-30: qty 100

## 2020-04-30 NOTE — Patient Instructions (Signed)
Visit Information  Goals Addressed      Patient Stated   .  "I need help with Mr. Sweda...he can't move today" (pt-stated)        CARE PLAN ENTRY (see longitudinal plan of care for additional care plan information)  Current Barriers:  [name] Knowledge Deficits related to etiology and treatment management for worsening symptoms of weakness, anuria, immobility, muscle rigidity with recent fall and ED visit  . Chronic Disease Management support and education needs related to Type II DM, non-insulin dependent, CKD, stage III, Essential hypertension, Dementia, Parkinsonism   Nurse Case Manager Clinical Goal(s):  [name] Over the next 24 hours, patient will follow up on recommendations to return to his local ED for evaluation and treatment for worsening symptoms of weakness, anuria, immobility, and muscle rigidity  CCM RN CM Interventions:  04/27/20 Inbound call completed with spouse [name] . Inter-disciplinary care team collaboration (see longitudinal plan of care) . Evaluation of current treatment plan related to gait disturbance with recent fall and ED visit and patient's adherence to plan as established by provider . Determined patient had a fall while at home alone on Sunday afternoon while his spouse ran out to pick up dinner, she is unsure what may have caused the fall . Determined spouse called EMS upon her arrival home after finding Mr. Cislo had fallen, he was evaluated and d/c with recommendations to f/u with his PCP . Determined upon arriving home from the ED, [name] had great difficulty getting Mr. Liew out of their car and in his home due to his inability to stand and or walk . Determined today, he continues to show worsening symptoms of immobility, he cannot stand, walk, turn or shift his weight, and he has not urinated since yesterday afternoon despite taking in what [name] considers to be "enough fluids" . Assessed for s/s suggestive of Stroke, patient is able to follow  simple commands, spouse reports no evidence of facial drooping or a symmetry with limb strength . Collaborated with PCP provider Dr. [name] regarding Mr. Damron condition and reported concerns from spouse . Instructed spouse [name] to call EMS to have Mr. Wildes re-evaluated in the ED . Educated spouse on potential triggers that may worsen PD symptoms such as AKI, UTI, dehydration and or other infection, and or missing doses of Carbidopa/Levodopa  . Determined Mr. Mullendore did miss his afternoon dose of Carbidopa/Levodopa yesterday afternoon, in addition, Mr. Westbrooks inadvertently took a double dose of his am medications Thursday morning at which time [name] called poison control and followed their advise . Instructed [name] to make the ED RN/MD aware of this mishap and to take all his medications with her to the ED, she verbalizes understanding and is agreeable  . Discussed plans with patient for ongoing care management follow up and provided patient with direct contact information for care management team 04/29/20 Inbound call completed with spouse [name] . Determined Mr. Mandato remains to be Inpatient and his condition is improving . Determined he will be discharged to an IP rehab facility upon discharge once ready . Discussed plans with patient for ongoing care management follow up and provided patient with direct contact information for care management team 04/30/20 Completed inbound call with spouse [name]  . Determined [name] would like a list of the top rated IP rehab facility's to help her become familiar with the facility's near by in order to chose a good place for Mr. Chock . Discussed the hospital SW/CM can  also provider her with a list of the local facility's with bed placement availability  . Provided [name] with a list of IP rehab facility's located in [place] including;  o [place]  . Discussed plans with patient for ongoing care management follow up and provided patient with direct contact information for care management team  Patient Self Care Activities:  . Attends all scheduled provider appointments . Calls provider office for new concerns or questions . Unable to self administer medications as prescribed . Unable to perform ADLs independently . Unable to perform IADLs independently . Supportive spouse to assist with care needs   Initial goal documentation       Patient verbalizes understanding of instructions provided today.   The care management team will reach out to the patient/spouse once Mr. Moody is discharged home  [name], RN, BSN, CCM Care Management Coordinator [place] [facility]  Direct Phone: [phone]

## 2020-04-30 NOTE — NC FL2 (Signed)
[place] LEVEL OF CARE SCREENING TOOL     IDENTIFICATION  Patient Name: Dylan [name]. Birthdate: Apr 03, 1941 Sex: male Admission Date (Current Location): 04/27/2020  [facility] [place] Number:  Herbalist and Address:  The . [facility], [place] [address], [place], [place] [address]      Provider Number: [id number]  Attending Physician Name and Address:  Geradine Girt, DO  Relative Name and Phone Number:  [name] [phone]    Current Level of Care: Hospital Recommended Level of Care: [place] Prior Approval Number:    Date Approved/Denied: 04/30/20 PASRR Number: [id number]  Discharge Plan: SNF    Current Diagnoses: Patient Active Problem List   Diagnosis Date Noted  . Elevated CK 04/27/2020  . Gout   . Hyperlipemia   . Parkinsonism ([place]) 01/29/2020  . 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 Dementia with behavioral disturbance ([place]) 05/22/2019  . Gait abnormality 05/22/2019  . Acute on chronic kidney failure ([place]) 07/14/2018  . Nephropathy 07/14/2018  . Other disorders of lung 07/14/2018  . Atherosclerosis of aorta ([place]) 07/14/2018  . Dehydration 07/14/2018  . 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 CKD (chronic kidney disease), stage III 06/13/2018  . Syncope 06/13/2018  . 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 Peripheral arterial disease ([place]) 05/13/2014  . Essential hypertension 05/13/2014  . Type 2 diabetes mellitus with stage 3a chronic kidney disease, without long-term current use of insulin ([place]) 05/13/2014  . Second degree AV block 05/13/2014    Orientation RESPIRATION BLADDER Height & Weight     Self, Situation, Place  Normal Incontinent Weight: 84.4 kg Height:  5\' 11"  (180.3 cm)  BEHAVIORAL SYMPTOMS/MOOD NEUROLOGICAL BOWEL NUTRITION STATUS      Continent Diet  AMBULATORY STATUS COMMUNICATION OF NEEDS Skin    Limited Assist Verbally Skin abrasions, Other (Comment) (skin tear to elbow, knee - abrasions to elbow)                       Personal Care Assistance Level of Assistance  Bathing, Feeding, Dressing Bathing Assistance: Limited assistance Feeding assistance: Limited assistance Dressing Assistance: Limited assistance     Functional Limitations Info  Sight, Hearing, Speech Sight Info: Adequate Hearing Info: Adequate Speech Info: Adequate    SPECIAL CARE FACTORS FREQUENCY  PT (By licensed PT), OT (By licensed OT)     PT Frequency: PT at SNF to eval and treat a min of 5x/week OT Frequency: OT at SNF to eval and treat a min of 5x/week            Contractures Contractures Info: Not present    Additional Factors Info  Code Status, Allergies Code Status Info: Full Allergies Info: No known drug allergies           Current Medications (04/30/2020):  This is the current hospital active medication list Current Facility-Administered Medications  Medication Dose Route Frequency Provider Last Rate Last Admin  . acetaminophen (TYLENOL) tablet 650 mg  650 mg Oral Q6H PRN Pahwani, Rinka R, MD   650 mg at 04/30/20 1036   Or  . acetaminophen (TYLENOL) suppository 650 mg  650 mg Rectal Q6H PRN Pahwani, Rinka R, MD      . aspirin EC tablet 81 mg  81 mg Oral Q M,W,F Pahwani, Rinka R, MD   81 mg at 04/29/20 1023  . carbidopa-levodopa (SINEMET IR) 25-100  MG per tablet immediate release 1.5 tablet  1.5 tablet Oral TID Pahwani, Rinka R, MD   1.5 tablet at 04/30/20 1028  . diclofenac Sodium (VOLTAREN) 1 % topical gel 2 g  2 g Topical TID Vann, Jessica U, DO      . enoxaparin (LOVENOX) injection 40 mg  40 mg Subcutaneous Q24H Vann, Jessica U, DO   40 mg at 04/29/20 1751  . insulin aspart (novoLOG) injection 0-15 Units  0-15 Units Subcutaneous TID WC Pahwani, Rinka R, MD   3 Units at 04/30/20 1155  . insulin aspart (novoLOG) injection 0-5 Units  0-5 Units Subcutaneous QHS Pahwani, Rinka R, MD       . melatonin tablet 10 mg  10 mg Oral QHS Pahwani, Rinka R, MD   10 mg at 04/29/20 2137  . memantine (NAMENDA) tablet 10 mg  10 mg Oral BID Pahwani, Rinka R, MD   10 mg at 04/30/20 1028  . ondansetron (ZOFRAN) tablet 4 mg  4 mg Oral Q6H PRN Pahwani, Rinka R, MD       Or  . ondansetron (ZOFRAN) injection 4 mg  4 mg Intravenous Q6H PRN Pahwani, Rinka R, MD      . rivastigmine (EXELON) 4.6 mg/24hr 4.6 mg  4.6 mg Transdermal Daily Pahwani, Rinka R, MD   4.6 mg at 04/30/20 1030     Discharge Medications: Please see discharge summary for a list of discharge medications.  Relevant Imaging Results:  Relevant Lab Results:   Additional Information SSN [id number] - patient has received his covid vaccinations  Bartholomew Crews, RN

## 2020-04-30 NOTE — Chronic Care Management (AMB) (Signed)
Chronic Care Management   Follow Up Note   04/30/2020 Name: Rad Gramling. MRN: [id number] DOB: 1941-07-24  Referred by: [name], MD Reason for referral : Chronic Care Management (FU RN CM Call )   Dylan [name]. is a 79 y.o. year old male who is a primary care patient of [name], MD. The CCM team was consulted for assistance with chronic disease management and care coordination needs.    Review of patient status, including review of consultants reports, relevant laboratory and other test results, and collaboration with appropriate care team members and the patient's provider was performed as part of comprehensive patient evaluation and provision of chronic care management services.    SDOH (Social Determinants of Health) assessments performed: No See Care Plan activities for detailed interventions related to [place])   Inbound call received from spouse [name] for a list of IP rehab facility's for Mr. Olver.     Facility-Administered Encounter Medications as of 04/30/2020  Medication  . acetaminophen (TYLENOL) tablet 650 mg   Or  . acetaminophen (TYLENOL) suppository 650 mg  . aspirin EC tablet 81 mg  . carbidopa-levodopa (SINEMET IR) 25-100 MG per tablet immediate release 1.5 tablet  . diclofenac Sodium (VOLTAREN) 1 % topical gel 2 g  . enoxaparin (LOVENOX) injection 40 mg  . insulin aspart (novoLOG) injection 0-15 Units  . insulin aspart (novoLOG) injection 0-5 Units  . melatonin tablet 10 mg  . memantine (NAMENDA) tablet 10 mg  . ondansetron (ZOFRAN) tablet 4 mg   Or  . ondansetron (ZOFRAN) injection 4 mg  . rivastigmine (EXELON) 4.6 mg/24hr 4.6 mg   Outpatient Encounter Medications as of 04/30/2020  Medication Sig Note  . allopurinol (ZYLOPRIM) 100 MG tablet Take 2 tablets (200 mg total) by mouth daily. (Patient taking differently: Take 200 mg by mouth every morning. )   . amLODipine (NORVASC) 5 MG tablet TAKE 1 TABLET BY MOUTH DAILY (Patient taking  differently: Take 5 mg by mouth every morning. )   . Ascorbic Acid (VITAMIN C) 1000 MG tablet Take 1,000 mg by mouth every morning.    [name] aspirin 81 MG tablet Take 81 mg by mouth every Monday, Wednesday, and Friday.    . carbidopa-levodopa (SINEMET IR) 25-100 MG tablet Take 1.5 tablets by mouth 3 (three) times daily. (Patient taking differently: Take 1.5 tablets by mouth See admin instructions. Take 1 1/2  tablet by mouth three times daily - 10am, 5pm and 10pm)   . carvedilol (COREG) 25 MG tablet Take 1 tablet (25 mg total) by mouth 2 (two) times daily.   . Cholecalciferol (VITAMIN D3) 5000 units CAPS Take 5,000 Units by mouth daily with supper.    . Coenzyme Q10 200 MG capsule Take 200 mg by mouth daily with supper.    . COLCRYS 0.6 MG tablet Take 1 tablet (0.6 mg total) by mouth daily. (Patient taking differently: Take 0.6 mg by mouth daily as needed (gout attacks). )   . diclofenac Sodium (VOLTAREN) 1 % GEL Apply 2 g topically 3 (three) times daily. (Patient not taking: Reported on 04/27/2020)   . 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 Lancet Devices (ONE TOUCH DELICA LANCING DEV) MISC Please fill ONE TOUCH DELICA LANCING DEVICE.  Use to test blood sugar twice daily as directed. E11.65   . Lancets MISC Please fill basic/generic push button lancets.  Patient to test blood sugar twice daily. DX: E11.65   .  Melatonin 10 MG TABS Take 10 mg by mouth at bedtime.   . memantine (NAMENDA) 10 MG tablet Take 1 tablet (10 mg total) by mouth 2 (two) times daily.   . metFORMIN (GLUCOPHAGE) 1000 MG tablet TAKE 1 TABLET BY MOUTH EVERY DAY (Patient taking differently: Take 1,000 mg by mouth every morning. )   . pravastatin (PRAVACHOL) 20 MG tablet TAKE 1 TABLET BY ORAL ROUTE EVERY DAY (Patient taking differently: Take 20 mg by mouth every morning. )   . rivastigmine (EXELON) 4.6 mg/24hr Place 1 patch (4.6 mg total) onto the skin daily. (Patient taking  differently: Place 4.6 mg onto the skin every evening. ) 04/27/2020: Current patch is on back of right shoulder  . TRULICITY 1.5 HW/8.0SU SOPN INJECT 1.5 MG INTO THE SKIN ONCE A WEEK. (Patient taking differently: Inject 1.5 mg into the skin every Monday. )   . valsartan (DIOVAN) 320 MG tablet TAKE 1 TABLET BY MOUTH EVERY DAY (Patient taking differently: Take 320 mg by mouth every morning. )      Objective:  Lab Results  Component Value Date   HGBA1C 7.5 (H) 03/24/2020   HGBA1C 8.1 (H) 12/23/2019   HGBA1C 10.5 (H) 09/19/2019   Lab Results  Component Value Date   MICROALBUR 80 01/22/2019   LDLCALC 66 07/24/2019   CREATININE 1.12 04/29/2020   BP Readings from Last 3 Encounters:  04/30/20 114/69  04/26/20 111/61  04/21/20 126/80   Goals Addressed      Patient Stated   .  "I need help with Mr. Matsuura...he can't move today" (pt-stated)        CARE PLAN ENTRY (see longitudinal plan of care for additional care plan information)  Current Barriers:  [name] Knowledge Deficits related to etiology and treatment management for worsening symptoms of weakness, anuria, immobility, muscle rigidity with recent fall and ED visit  . Chronic Disease Management support and education needs related to Type II DM, non-insulin dependent, CKD, stage III, Essential hypertension, Dementia, Parkinsonism   Nurse Case Manager Clinical Goal(s):  [name] Over the next 24 hours, patient will follow up on recommendations to return to his local ED for evaluation and treatment for worsening symptoms of weakness, anuria, immobility, and muscle rigidity  CCM RN CM Interventions:  04/27/20 Inbound call completed with spouse [name] . Inter-disciplinary care team collaboration (see longitudinal plan of care) . Evaluation of current treatment plan related to gait disturbance with recent fall and ED visit and patient's adherence to plan as established by provider . Determined patient had a fall while at home alone on Sunday  afternoon while his spouse ran out to pick up dinner, she is unsure what may have caused the fall . Determined spouse called EMS upon her arrival home after finding Mr. Mysliwiec had fallen, he was evaluated and d/c with recommendations to f/u with his PCP . Determined upon arriving home from the ED, [name] had great difficulty getting Mr. Whiteford out of their car and in his home due to his inability to stand and or walk . Determined today, he continues to show worsening symptoms of immobility, he cannot stand, walk, turn or shift his weight, and he has not urinated since yesterday afternoon despite taking in what [name] considers to be "enough fluids" . Assessed for s/s suggestive of Stroke, patient is able to follow simple commands, spouse reports no evidence of facial drooping or a symmetry with limb strength . Collaborated with PCP provider Dr. [name] regarding Mr. Fritsch  condition and reported concerns from spouse . Instructed spouse [name] to call EMS to have Mr. Dalgleish re-evaluated in the ED . Educated spouse on potential triggers that may worsen PD symptoms such as AKI, UTI, dehydration and or other infection, and or missing doses of Carbidopa/Levodopa  . Determined Mr. Muckleroy did miss his afternoon dose of Carbidopa/Levodopa yesterday afternoon, in addition, Mr. Smolinsky inadvertently took a double dose of his am medications Thursday morning at which time [name] called poison control and followed their advise . Instructed [name] to make the ED RN/MD aware of this mishap and to take all his medications with her to the ED, she verbalizes understanding and is agreeable  . Discussed plans with patient for ongoing care management follow up and provided patient with direct contact information for care management team 04/29/20 Inbound call completed with spouse [name] . Determined Mr. Kornegay remains to be Inpatient and his condition is improving . Determined he will be  discharged to an IP rehab facility upon discharge once ready . Discussed plans with patient for ongoing care management follow up and provided patient with direct contact information for care management team 04/30/20 Completed inbound call with spouse [name]  . Determined [name] would like a list of the top rated IP rehab facility's to help her become familiar with the facility's near by in order to chose a good place for Mr. Janusz . Discussed the hospital SW/CM can also provider her with a list of the local facility's with bed placement availability  . Provided [name] with a list of IP rehab facility's located in [place] including;  o [place]  . Discussed plans with patient for ongoing care management follow up and provided patient with direct contact information for care management team  Patient Self Care Activities:  . Attends all scheduled provider appointments . Calls provider office for new concerns or questions . Unable to self administer medications as prescribed . Unable to perform ADLs independently . Unable to perform IADLs independently . Supportive spouse to assist with care needs   Initial goal documentation        Plan:   The care management team will reach out to the patient/spouse following his discharge home.    [name], RN, BSN, CCM Care Management Coordinator [place] [facility]  Direct Phone: [phone]

## 2020-04-30 NOTE — Progress Notes (Signed)
Progress Note    Dylan Ina.  PXT:[id number] DOB: Nov 23, 1940  DOA: 04/27/2020 PCP: [name], MD    Brief Narrative:     Medical records reviewed and are as summarized below:  Dylan Ina. is an 79 y.o. 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 Patient is poor historian. Patient's wife at bedside is the historian. She tells me that patient fell on the ground on Saturday for couple of hours while she was not at home in 1. She came and found him on the floor. She brought him to the ER on 6/27.His pacemaker was interrogated and patient discharged home in stable condition. Patient wife tells me that patient has decreased appetite, unable to walk/move, he needs 24/7 care which she cannot provide as she is suffering from CHF/arthritis. She requested for SNF placement.  Assessment/Plan:   Principal Problem:   Syncope Active Problems:   Essential hypertension   Type 2 diabetes mellitus with stage 3a chronic kidney disease, without long-term current use of insulin (HCC)   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HCC)   Acute on chronic kidney failure (HCC)   Parkinsonism (HCC)   Elevated CK   Gout   Hyperlipemia   Syncope: -Patient is afebrile, no leukocytosis. UA, UDS: Negative for infection -CT head, chest x-ray, abdominal x-ray: Negative for acute findings. -Suspect related to hypotension and volume status  AKI on CKD stage III likely prerenal in the setting of dehydration with poor oral intake: -Presented with creatinine: 3.47, GFR: 18 -Creatinine downtrending  to normal -Likely secondary to dehydration due to decreased p.o. intake. Continue to avoid nephrotoxins and hypotension  Decreased urinary output: resolved -Likely secondary to  dehydration.  -Needs strict I's and O's  Essential hypertension, blood pressure currently soft -Hold home blood pressure medications  Elevated CK: -Likely secondary to fall -Trending down  Parkinson:  -Continue Sinemet -Makes him higher fall risk  Dementia:  -Continue Namenda and rivastigmine  Complete heart block status post pacemaker placement: -Device interrogation was negative in ED -d/c tele  Hyperlipidemia: Continue statin  Type 2 diabetes mellitus: Hold Metformin and Trulicity -Hemoglobin O2V 7.5 on 03/24/2020.  Continue insulin sliding scale and avoid hypoglycemia  Physical deconditioning: -Consulted case manager for SNF placement.   Family Communication/Anticipated D/C date and plan/Code Status   DVT prophylaxis: Lovenox ordered. Code Status: Full Code.   Disposition Plan: Status is: Inpatient  Remains inpatient appropriate because:Unsafe d/c plan   Dispo:  Patient From: Home  Planned Disposition: [place]  Expected discharge date: when bed available  Medically stable for discharge: No- needs SNF     Medical Consultants:    None.    Subjective:   C/o thigh soreness  Objective:    Vitals:   04/29/20 2030 04/29/20 2109 04/30/20 0447 04/30/20 0800  BP: 129/76 (!) 151/80 (!) 155/77 114/69  Pulse: 88 65 71 80  Resp: 18 18 18 18   Temp: 97.6 F (36.4 C) 97.9 F (36.6 C) 97.8 F (36.6 C) 97.9 F (36.6 C)  TempSrc:    Oral  SpO2: 100% 99% 100% 99%  Weight:   84.4 kg   Height:        Intake/Output Summary (Last 24 hours) at 04/30/2020 1155 Last data filed at 04/30/2020 1000 Gross per 24 hour  Intake 1259.86 ml  Output 3801 ml  Net -2541.14 ml   Filed Weights   04/28/20 0548 04/29/20 0442 04/30/20 0447  Weight: 83.4 kg 85.4 kg 84.4 kg    Exam:   General: Appearance:     Overweight male in no acute distress     Lungs:     Clear to auscultation bilaterally, respirations unlabored  Heart:    Normal heart  rate. Normal rhythm. No murmurs, rubs, or gallops.   MS:   All extremities are intact. Thighs w/o swelling/tightness  Neurologic:   Awake, alert     Data Reviewed:   I have personally reviewed following labs and imaging studies:  Labs: Labs show the following:   Basic Metabolic Panel: Recent Labs  Lab 04/26/20 1825 04/26/20 1825 04/27/20 1556 04/27/20 1556 04/27/20 1855 04/28/20 0021 04/29/20 1105  NA 140  --  137  --   --  139 138  K 4.6   < > 4.4   < >  --  3.9 4.3  CL 103  --  101  --   --  107 107  CO2 22  --  22  --   --  22 20*  GLUCOSE 200*  --  219*  --   --  144* 169*  BUN 21  --  46*  --   --  51* 21  CREATININE 1.40*  --  3.47*  --  3.29* 3.10* 1.12  CALCIUM 9.5  --  9.1  --   --  8.4* 9.1  MG  --   --   --   --  2.0  --   --   PHOS  --   --   --   --  3.9  --   --    < > = values in this interval not displayed.   GFR Estimated Creatinine Clearance: 57.9 mL/min (by C-G formula based on SCr of 1.12 mg/dL). Liver Function Tests: Recent Labs  Lab 04/26/20 1825 04/27/20 1556 04/28/20 0021  AST 101* 83* 74*  ALT 38 15 11  ALKPHOS 103 93 77  BILITOT 1.3* 0.7 0.8  PROT 7.2 6.9 5.9*  ALBUMIN 3.7 3.6 3.0*   No results for input(s): LIPASE, AMYLASE in the last 168 hours. No results for input(s): AMMONIA in the last 168 hours. Coagulation profile No results for input(s): INR, PROTIME in the last 168 hours.  CBC: Recent Labs  Lab 04/26/20 1825 04/27/20 1556 04/27/20 1855 04/28/20 0021  WBC 8.4 7.7 7.2 6.8  NEUTROABS  --  4.6  --   --   HGB 14.0 12.8* 11.7* 11.1*  HCT 43.1 40.0 36.1* 34.3*  MCV 89.0 89.3 88.3 87.9  PLT 169 150 145* 125*   Cardiac Enzymes: Recent Labs  Lab 04/27/20 1556 04/28/20 0021  CKTOTAL 3,228* 2,382*   BNP (last 3 results) No results for input(s): PROBNP in the last 8760 hours. CBG: Recent Labs  Lab 04/29/20 1117 04/29/20 1618 04/29/20 2112 04/30/20 0643 04/30/20 1102  GLUCAP 129* 141* 105* 112* 157*    D-Dimer: No results for input(s): DDIMER in the last 72 hours. Hgb A1c: No results for input(s): HGBA1C in the last 72 hours. Lipid Profile: No results for input(s): CHOL, HDL, LDLCALC, TRIG, CHOLHDL, LDLDIRECT in the last 72 hours. Thyroid function studies: Recent Labs    04/27/20 1844  TSH 1.679   Anemia work up: No results for input(s): VITAMINB12, FOLATE, FERRITIN, TIBC, IRON, RETICCTPCT in the last 72 hours. Sepsis Labs: Recent Labs  Lab 04/26/20 1825 04/27/20  1556 04/27/20 1855 04/27/20 1950 04/28/20 0021  WBC 8.4 7.7 7.2  --  6.8  LATICACIDVEN  --   --   --  1.3 1.0    Microbiology Recent Results (from the past 240 hour(s))  Urine culture     Status: None   Collection Time: 04/27/20  4:04 PM   Specimen: Urine, Random  Result Value Ref Range Status   Specimen Description URINE, RANDOM  Final   Special Requests NONE  Final   Culture   Final    NO GROWTH Performed at [facility], [place] [address] [place], [place] [address]    Report Status 04/29/2020 FINAL  Final  SARS Coronavirus 2 by RT PCR (hospital order, performed in [facility] hospital lab) Nasopharyngeal Nasopharyngeal Swab     Status: None   Collection Time: 04/27/20  5:41 PM   Specimen: Nasopharyngeal Swab  Result Value Ref Range Status   SARS Coronavirus 2 NEGATIVE NEGATIVE Final    Comment: (NOTE) SARS-CoV-2 target nucleic acids are NOT DETECTED.  The SARS-CoV-2 RNA is generally detectable in upper and lower respiratory specimens during the acute phase of infection. The lowest concentration of SARS-CoV-2 viral copies this assay can detect is 250 copies / mL. A negative result does not preclude SARS-CoV-2 infection and should not be used as the sole basis for treatment or other patient management decisions.  A negative result may occur with improper specimen collection / handling, submission of specimen other than nasopharyngeal swab, presence of viral mutation(s) within the areas  targeted by this assay, and inadequate number of viral copies (<250 copies / mL). A negative result must be combined with clinical observations, patient history, and epidemiological information.  Fact Sheet for Patients:   StrictlyIdeas.no  Fact Sheet for Healthcare Providers: BankingDealers.co.za  This test is not yet approved or  cleared by the [place] FDA and has been authorized for detection and/or diagnosis of SARS-CoV-2 by FDA under an Emergency Use Authorization (EUA).  This EUA will remain in effect (meaning this test can be used) for the duration of the COVID-19 declaration under Section 564(b)(1) of the Act, 21 U.S.C. section 360bbb-3(b)(1), unless the authorization is terminated or revoked sooner.  Performed at [facility], [place] [address] [place], [place] [address]     Procedures and diagnostic studies:  No results found.  Medications:   . aspirin EC  81 mg Oral Q M,W,F  . carbidopa-levodopa  1.5 tablet Oral TID  . diclofenac Sodium  2 g Topical TID  . enoxaparin (LOVENOX) injection  40 mg Subcutaneous Q24H  . insulin aspart  0-15 Units Subcutaneous TID WC  . insulin aspart  0-5 Units Subcutaneous QHS  . melatonin  10 mg Oral QHS  . memantine  10 mg Oral BID  . rivastigmine  4.6 mg Transdermal Daily   Continuous Infusions:    LOS: 3 days   Dylan [name]  [facility]   How to contact the [facility] Attending or Consulting provider [place] or covering provider during after hours [place], for this patient?  1. Check the care team in [facility] and look for a) attending/consulting TRH provider listed and b) the [facility] team listed 2. Log into [email or web address] and use [place]'s universal password to access. If you do not have the password, please contact the hospital operator. 3. Locate the [facility] provider you are looking for under [facility] and page to a number that you can be directly reached.  4. If  you still have difficulty reaching the provider, please page the [facility] (Director on Call) for the Hospitalists listed on amion for assistance.  04/30/2020, 11:55 AM

## 2020-04-30 NOTE — TOC Initial Note (Addendum)
Transition of Care (TOC) - Initial/Assessment Note    Patient Details  Name: Dylan [name]. MRN: [id number] Date of Birth: 06-Aug-1941  Transition of Care [facility]) CM/SW Contact:    Bartholomew Crews, RN Phone Number: [phone] 04/30/2020, 4:00 PM  Clinical Narrative:                  4270 Bed offer provided to patient and spouse along with medicare.gov information. Spouse to research facility before making a decision.  1611 Received call back from [facility] - declined bed offer d/t out of network with [facility].  22 Spoke with patient and spouse at the bedside to discuss transition plans. Patient is agreeable to SNF and provides permission for NCM to speak with his spouse. PTA patient lived at home with his spouse, and used a walker to assist with ambulation.   Discussed process for seeking SNF. Patient and spouse agreeable to faxing out information. First choice was [facility], however, they are out of network with [facility]. Second choice, [facility] Voicemail left at [facility] to request potential bed availability.   PASRR# received today. FL2 completed pending MD signature. FL2 faxed out to local SNFs.   Patient and spouse have both received their covid vaccinations. Spouse states that when patient eventually transitions home that he will need a hospital bed. Advised that SNF will assist with transition needs to home.   TOC following for transition needs.   Expected Discharge Plan: Skilled Nursing Facility Barriers to Discharge: Insurance Authorization   Patient Goals and CMS Choice Patient states their goals for this hospitalization and ongoing recovery are:: skilled rehab CMS Medicare.gov Compare Post Acute Care list provided to:: Patient Choice offered to / list presented to : Patient, Spouse  Expected Discharge Plan and Services Expected Discharge Plan: [place] In-house Referral: Clinical Social Work Discharge Planning  Services: CM Consult Post Acute Care Choice: [place] arrangements for the past 2 months: Single Family Home                 DME Arranged: N/A DME Agency: NA       HH Arranged: NA [place] Agency: NA        Prior Living Arrangements/Services Living arrangements for the past 2 months: [place] Lives with:: Self, Spouse Patient language and need for interpreter reviewed:: Yes Do you feel safe going back to the place where you live?: Yes      Need for Family Participation in Patient Care: Yes (Comment) Care giver support system in place?: Yes (comment) Current home services: DME (RW) Criminal Activity/Legal Involvement Pertinent to Current Situation/Hospitalization: No - Comment as needed  Activities of Daily Living Home Assistive Devices/Equipment: Environmental consultant (specify type) ADL Screening (condition at time of admission) Patient's cognitive ability adequate to safely complete daily activities?: Yes Is the patient deaf or have difficulty hearing?: No Does the patient have difficulty seeing, even when wearing glasses/contacts?: No Does the patient have difficulty concentrating, remembering, or making decisions?: No Patient able to express need for assistance with ADLs?: Yes Does the patient have difficulty dressing or bathing?: No Independently performs ADLs?: Yes (appropriate for developmental age) Does the patient have difficulty walking or climbing stairs?: Yes Weakness of Legs: Both Weakness of Arms/Hands: None  Permission Sought/Granted Permission sought to share information with : Family Supports Permission granted to share information with : Yes, Verbal Permission Granted  Share Information with NAME: [name]     Permission granted  to share info w Relationship: spouse  Permission granted to share info w Contact Information: [phone]  Emotional Assessment Appearance:: Appears stated age Attitude/Demeanor/Rapport: Engaged Affect  (typically observed): Accepting Orientation: : Oriented to Self, Oriented to Place, Oriented to Situation Alcohol / Substance Use: Not Applicable Psych Involvement: No (comment)  Admission diagnosis:  Syncope and collapse [R55] Rib pain [R07.81] Elevated CK [R74.8] Acute on chronic kidney failure (HCC) [N17.9, N18.9] AKI (acute kidney injury) ([place]) [N17.9] Patient Active Problem List   Diagnosis Date Noted  . Elevated CK 04/27/2020  . Gout   . Hyperlipemia   . Parkinsonism ([place]) 01/29/2020  . 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 Dementia with behavioral disturbance ([place]) 05/22/2019  . Gait abnormality 05/22/2019  . Acute on chronic kidney failure ([place]) 07/14/2018  . Nephropathy 07/14/2018  . Other disorders of lung 07/14/2018  . Atherosclerosis of aorta ([place]) 07/14/2018  . Dehydration 07/14/2018  . 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 CKD (chronic kidney disease), stage III 06/13/2018  . Syncope 06/13/2018  . 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[place]) 07/11/2017  . 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 Peripheral arterial disease ([place]) 05/13/2014  . Essential hypertension 05/13/2014  . Type 2 diabetes mellitus with stage 3a chronic kidney disease, without long-term current use of insulin ([place]) 05/13/2014  . Second degree AV block 05/13/2014   PCP:  [name], MD Pharmacy:   [id number] [place], [place] - [address] [place] [address] [place] [address] Phone: [phone] Fax: [phone]  [id number] [place], [place] - [address] AT [facility] [address] [place] [address] Phone: [phone] Fax: [phone]     Social Determinants of Health (SDOH) Interventions    Readmission Risk Interventions No flowsheet data found.

## 2020-05-01 DIAGNOSIS — R748 Abnormal levels of other serum enzymes: Secondary | ICD-10-CM

## 2020-05-01 LAB — BASIC METABOLIC PANEL
Anion gap: 11 (ref 5–15)
BUN: 23 mg/dL (ref 8–23)
CO2: 26 mmol/L (ref 22–32)
Calcium: 9.5 mg/dL (ref 8.9–10.3)
Chloride: 103 mmol/L (ref 98–111)
Creatinine, Ser: 1.3 mg/dL — ABNORMAL HIGH (ref 0.61–1.24)
GFR calc Af Amer: >60 mL/min (ref >60)
GFR calc non Af Amer: 52 mL/min — ABNORMAL LOW (ref >60)
Glucose, Bld: 112 mg/dL — ABNORMAL HIGH (ref 70–99)
Potassium: 3.3 mmol/L — ABNORMAL LOW (ref 3.5–5.1)
Sodium: 140 mmol/L (ref 135–145)

## 2020-05-01 LAB — GLUCOSE, CAPILLARY
Glucose-Capillary: 89 mg/dL (ref 70–99)
Glucose-Capillary: 110 mg/dL — ABNORMAL HIGH (ref 70–99)
Glucose-Capillary: 153 mg/dL — ABNORMAL HIGH (ref 70–99)
Glucose-Capillary: 114 mg/dL — ABNORMAL HIGH (ref 70–99)

## 2020-05-01 LAB — CK: Total CK: 275 U/L (ref 49–397)

## 2020-05-01 MED ORDER — POTASSIUM CHLORIDE CRYS ER 20 MEQ PO TBCR
40.0000 meq | EXTENDED_RELEASE_TABLET | Freq: Once | ORAL | Status: AC
  Administered 2020-05-01: 40 meq via ORAL
  Filled 2020-05-01: qty 2

## 2020-05-01 NOTE — TOC Progression Note (Signed)
Transition of Care (TOC) - Progression Note    Patient Details  Name: Dylan [name]. MRN: [id number] Date of Birth: September 27, 1941  Transition of Care [facility]) CM/SW Contact  Bartholomew Crews, RN Phone Number: [phone] 05/01/2020, 11:21 AM  Clinical Narrative:     Spoke with patient's spouse, Dylan [name], on the phone to review new bed offers. Sp[facility] selected [facility] . Confirmed [facility] with [facility]. Patient needs updated PT and OT notes - rehab services notified. SNF to initiate insurance authorization. TOC following for transition needs.   Expected Discharge Plan: Skilled Nursing Facility Barriers to Discharge: Insurance Authorization  Expected Discharge Plan and Services Expected Discharge Plan: [place] In-house Referral: Clinical Social Work Discharge Planning Services: CM Consult Post Acute Care Choice: [place] Living arrangements for the past 2 months: Single Family Home                 DME Arranged: N/A DME Agency: NA       HH Arranged: NA HH Agency: NA         Social Determinants of Health (SDOH) Interventions    Readmission Risk Interventions No flowsheet data found.

## 2020-05-01 NOTE — Progress Notes (Signed)
Progress Note    Dylan Ina.  IRW:[id number] DOB: 02/12/1941  DOA: 04/27/2020 PCP: [name], MD    Brief Narrative:     Medical records reviewed and are as summarized below:  Dylan Ina. is an 79 y.o. 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  Patient is poor historian. Patient's wife at bedside is the historian. She tells me that patient fell on the ground on Saturday for couple of hours while she was not at home in 1. She came and found him on the floor. She brought him to the ER on 6/27.His pacemaker was interrogated and patient discharged home in stable condition.   Assessment/Plan:   Principal Problem:   Syncope Active Problems:   Essential hypertension   Type 2 diabetes mellitus with stage 3a chronic kidney disease, without long-term current use of insulin (HCC)   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HCC)   Acute on chronic kidney failure (HCC)   Parkinsonism (HCC)   Elevated CK   Gout   Hyperlipemia   Syncope: -Patient is afebrile, no leukocytosis. UA, UDS: Negative for infection -CT head, chest x-ray, abdominal x-ray: Negative for acute findings. -Suspect related to hypotension and volume status  AKI on CKD stage III likely prerenal in the setting of dehydration with poor oral intake: -Presented with creatinine: 3.47, GFR: 18 -Creatinine downtrending  to normal -Likely secondary to dehydration due to decreased p.o. intake. Continue to avoid nephrotoxins and hypotension  Decreased urinary output: Appears to have 5 L documented output yesterday -Likely secondary to dehydration.  -Needs strict I's and O's  Essential hypertension, blood pressure currently soft -Hold home blood pressure medications  Elevated  CK: -Likely secondary to fall -Trending down  Parkinson:  -Continue Sinemet -Makes him higher fall risk  Dementia:  -Continue Namenda and rivastigmine  Complete heart block status post pacemaker placement: -Device interrogation was negative in ED -d/c tele  Hyperlipidemia: Continue statin  Type 2 diabetes mellitus: Hold Metformin and Trulicity -Hemoglobin P6P 7.5 on 03/24/2020.  Continue insulin sliding scale and avoid hypoglycemia  Physical deconditioning: -Consulted case manager for SNF placement.   Family Communication/Anticipated D/C date and plan/Code Status   DVT prophylaxis: Lovenox ordered. Code Status: Full Code.   Disposition Plan: Status is: Inpatient  Remains inpatient appropriate because:Unsafe d/c plan   Dispo:  Patient From: Home  Planned Disposition: [place]  Expected discharge date: when bed available  Medically stable for discharge: No- needs SNF     Medical Consultants:    None.    Subjective:   Having trouble feeding himself  Objective:    Vitals:   04/30/20 1603 04/30/20 2044 05/01/20 0548 05/01/20 0830  BP: 130/76 (!) 147/70 132/77 137/62  Pulse: 69 71 72 66  Resp: 20 18 18 18   Temp: 98 F (36.7 C) 98 F (36.7 C) 98.7 F (37.1 C) 97.6 F (36.4 C)  TempSrc: Oral  Oral Oral  SpO2: 98% 100% 96% 99%  Weight:      Height:        Intake/Output Summary (Last 24 hours) at 05/01/2020 1406 Last data filed at 05/01/2020 0843 Gross per 24 hour  Intake 960 ml  Output 1000 ml  Net -40 ml   Filed Weights   04/28/20 0548 04/29/20 0442 04/30/20 0447  Weight: 83.4 kg 85.4 kg 84.4  kg    Exam:  General: Appearance:     Overweight male in no acute distress     Lungs:     Clear to auscultation bilaterally, respirations unlabored  Heart:    Normal heart rate. Normal rhythm. No murmurs, rubs, or gallops.   MS:   All extremities are intact.   Neurologic: Awake and alert, having trouble feeding himself     Data Reviewed:   I have personally reviewed following labs and imaging studies:  Labs: Labs show the following:   Basic Metabolic Panel: Recent Labs  Lab 04/26/20 1825 04/26/20 1825 04/27/20 1556 04/27/20 1556 04/27/20 1855 04/28/20 0021 04/28/20 0021 04/29/20 1105 05/01/20 0427  NA 140  --  137  --   --  139  --  138 140  K 4.6   < > 4.4   < >  --  3.9   < > 4.3 3.3*  CL 103  --  101  --   --  107  --  107 103  CO2 22  --  22  --   --  22  --  20* 26  GLUCOSE 200*  --  219*  --   --  144*  --  169* 112*  BUN 21  --  46*  --   --  51*  --  21 23  CREATININE 1.40*   < > 3.47*  --  3.29* 3.10*  --  1.12 1.30*  CALCIUM 9.5  --  9.1  --   --  8.4*  --  9.1 9.5  MG  --   --   --   --  2.0  --   --   --   --   PHOS  --   --   --   --  3.9  --   --   --   --    < > = values in this interval not displayed.   GFR Estimated Creatinine Clearance: 49.9 mL/min (A) (by C-G formula based on SCr of 1.3 mg/dL (H)). Liver Function Tests: Recent Labs  Lab 04/26/20 1825 04/27/20 1556 04/28/20 0021  AST 101* 83* 74*  ALT 38 15 11  ALKPHOS 103 93 77  BILITOT 1.3* 0.7 0.8  PROT 7.2 6.9 5.9*  ALBUMIN 3.7 3.6 3.0*   No results for input(s): LIPASE, AMYLASE in the last 168 hours. No results for input(s): AMMONIA in the last 168 hours. Coagulation profile No results for input(s): INR, PROTIME in the last 168 hours.  CBC: Recent Labs  Lab 04/26/20 1825 04/27/20 1556 04/27/20 1855 04/28/20 0021  WBC 8.4 7.7 7.2 6.8  NEUTROABS  --  4.6  --   --   HGB 14.0 12.8* 11.7* 11.1*  HCT 43.1 40.0 36.1* 34.3*  MCV 89.0 89.3 88.3 87.9  PLT 169 150 145* 125*   Cardiac Enzymes: Recent Labs  Lab 04/27/20 1556 04/28/20 0021 05/01/20 0427  CKTOTAL 3,228* 2,382* 275   BNP (last 3 results) No results for input(s): PROBNP in the last 8760 hours. CBG: Recent Labs  Lab 04/30/20 1102 04/30/20 1600 04/30/20 2044 05/01/20 0646 05/01/20 1127  GLUCAP 157* 292* 81 89 110*    D-Dimer: No results for input(s): DDIMER in the last 72 hours. Hgb A1c: No results for input(s): HGBA1C in the last 72 hours. Lipid Profile: No results for input(s): CHOL, HDL, LDLCALC, TRIG, CHOLHDL, LDLDIRECT in the last 72 hours. Thyroid function studies: No results for input(s): TSH, T4TOTAL, T3FREE,  THYROIDAB in the last 72 hours.  Invalid input(s): FREET3 Anemia work up: No results for input(s): VITAMINB12, FOLATE, FERRITIN, TIBC, IRON, RETICCTPCT in the last 72 hours. Sepsis Labs: Recent Labs  Lab 04/26/20 1825 04/27/20 1556 04/27/20 1855 04/27/20 1950 04/28/20 0021  WBC 8.4 7.7 7.2  --  6.8  LATICACIDVEN  --   --   --  1.3 1.0    Microbiology Recent Results (from the past 240 hour(s))  Urine culture     Status: None   Collection Time: 04/27/20  4:04 PM   Specimen: Urine, Random  Result Value Ref Range Status   Specimen Description URINE, RANDOM  Final   Special Requests NONE  Final   Culture   Final    NO GROWTH Performed at [facility], [place] [address], [place], [place] [address]    Report Status 04/29/2020 FINAL  Final  SARS Coronavirus 2 by RT PCR (hospital order, performed in [facility] hospital lab) Nasopharyngeal Nasopharyngeal Swab     Status: None   Collection Time: 04/27/20  5:41 PM   Specimen: Nasopharyngeal Swab  Result Value Ref Range Status   SARS Coronavirus 2 NEGATIVE NEGATIVE Final    Comment: (NOTE) SARS-CoV-2 target nucleic acids are NOT DETECTED.  The SARS-CoV-2 RNA is generally detectable in upper and lower respiratory specimens during the acute phase of infection. The lowest concentration of SARS-CoV-2 viral copies this assay can detect is 250 copies / mL. A negative result does not preclude SARS-CoV-2 infection and should not be used as the sole basis for treatment or other patient management decisions.  A negative result may occur with improper specimen collection / handling, submission of specimen other than nasopharyngeal  swab, presence of viral mutation(s) within the areas targeted by this assay, and inadequate number of viral copies (<250 copies / mL). A negative result must be combined with clinical observations, patient history, and epidemiological information.  Fact Sheet for Patients:   StrictlyIdeas.no  Fact Sheet for Healthcare Providers: BankingDealers.co.za  This test is not yet approved or  cleared by the [place] FDA and has been authorized for detection and/or diagnosis of SARS-CoV-2 by FDA under an Emergency Use Authorization (EUA).  This EUA will remain in effect (meaning this test can be used) for the duration of the COVID-19 declaration under Section 564(b)(1) of the Act, 21 U.S.C. section 360bbb-3(b)(1), unless the authorization is terminated or revoked sooner.  Performed at [facility], [place] [address], [place], [place] [address]     Procedures and diagnostic studies:  No results found.  Medications:   . aspirin EC  81 mg Oral Q M,W,F  . carbidopa-levodopa  1.5 tablet Oral TID  . diclofenac Sodium  2 g Topical TID  . enoxaparin (LOVENOX) injection  40 mg Subcutaneous Q24H  . insulin aspart  0-15 Units Subcutaneous TID WC  . insulin aspart  0-5 Units Subcutaneous QHS  . melatonin  10 mg Oral QHS  . memantine  10 mg Oral BID  . rivastigmine  4.6 mg Transdermal Daily   Continuous Infusions:    LOS: 4 days   Geradine Girt  [facility]   How to contact the [facility] Attending or Consulting provider [place] or covering provider during after hours [place], for this patient?  1. Check the care team in [facility] and look for a) attending/consulting TRH provider listed and b) the [facility] team listed 2. Log into [email or web address] and use [place]'s universal password  to access. If you do not have the password, please contact the hospital operator. 3. Locate the [facility] provider you are looking for under [facility] and page  to a number that you can be directly reached. 4. If you still have difficulty reaching the provider, please page the [facility] (Director on Call) for the Hospitalists listed on amion for assistance.  05/01/2020, 2:06 PM

## 2020-05-01 NOTE — Progress Notes (Signed)
Physical Therapy Treatment Patient Details Name: Dylan [name]. MRN: [id number] DOB: October 21, 1941 Today's Date: 05/01/2020    History of Present Illness Pt is a 79 y/o male presenting to the ED following fall and inability to ambulate. PMH includes Dementia, Parkinson's disease, HTN, DM, CKD, and s/p pacemaker.     PT Comments    Patient received in bed, agrees to PT session. He performed bed level exercises, then supine to sit with min assist. Transferred with min/mod assist with posterior lean initially. He ambulated 125 feet with RW and min guard. He will continue to benefit from skilled PT to improve strength and functional independence.    Follow Up Recommendations  SNF;Supervision/Assistance - 24 hour He may be able to return home if has assistance.      Equipment Recommendations  None recommended by PT    Recommendations for Other Services       Precautions / Restrictions Precautions Precautions: Fall Restrictions Weight Bearing Restrictions: No    Mobility  Bed Mobility Overal bed mobility: Needs Assistance Bed Mobility: Supine to Sit     Supine to sit: Min guard     General bed mobility comments: oob in chair  Transfers Overall transfer level: Needs assistance Equipment used: Rolling walker (2 wheeled) Transfers: Sit to/from Stand Sit to Stand: Min assist;From elevated surface         General transfer comment: Requires cues for hand placement. Posterior lean with initial standing balance.  Ambulation/Gait Ambulation/Gait assistance: Min guard Gait Distance (Feet): 125 Feet Assistive device: Rolling walker (2 wheeled) Gait Pattern/deviations: Step-through pattern;Decreased stride length;Shuffle;Decreased dorsiflexion - left;Decreased dorsiflexion - right Gait velocity: decreased   General Gait Details: improved ability this visit. Ambulated with min guard. improved safety from last visit per notes.   Stairs             Wheelchair Mobility     Modified Rankin (Stroke Patients Only)       Balance Overall balance assessment: Needs assistance Sitting-balance support: Feet supported;Single extremity supported Sitting balance-Leahy Scale: Fair     Standing balance support: Bilateral upper extremity supported;During functional activity Standing balance-Leahy Scale: Fair Standing balance comment: reliant on RW and external assist for safety                            Cognition Arousal/Alertness: Awake/alert Behavior During Therapy: WFL for tasks assessed/performed Overall Cognitive Status: No family/caregiver present to determine baseline cognitive functioning                                 General Comments: Patient pleasant, in good mood.      Exercises Other Exercises Other Exercises: B LE exercises: AP, heel slides, hip abd/add x 10 reps each    General Comments General comments (skin integrity, edema, etc.): pt attempting to void and only producing a clear muscus like void no actual bowel movmenet      Pertinent Vitals/Pain Pain Assessment: No/denies pain    Home Living                      Prior Function            PT Goals (current goals can now be found in the care plan section) Acute Rehab PT Goals Patient Stated Goal: none stated PT Goal Formulation: With patient Time For Goal Achievement: 05/11/20 Potential to Achieve Goals:  Fair Progress towards PT goals: Progressing toward goals    Frequency    Min 2X/week      PT Plan Current plan remains appropriate    Co-evaluation              AM-PAC PT "6 Clicks" Mobility   Outcome Measure  Help needed turning from your back to your side while in a flat bed without using bedrails?: A Little Help needed moving from lying on your back to sitting on the side of a flat bed without using bedrails?: A Little Help needed moving to and from a bed to a chair (including a wheelchair)?: A Little Help needed  standing up from a chair using your arms (e.g., wheelchair or bedside chair)?: A Little Help needed to walk in hospital room?: A Little Help needed climbing 3-5 steps with a railing? : A Lot 6 Click Score: 17    End of Session Equipment Utilized During Treatment: Gait belt Activity Tolerance: Patient tolerated treatment well Patient left: in chair;with call bell/phone within reach;with chair alarm set Nurse Communication: Mobility status PT Visit Diagnosis: Muscle weakness (generalized) (M62.81);Other abnormalities of gait and mobility (R26.89);Difficulty in walking, not elsewhere classified (R26.2)     Time: 1100-3496 PT Time Calculation (min) (ACUTE ONLY): 29 min  Charges:  $Gait Training: 8-22 mins $Therapeutic Exercise: 8-22 mins                     Adlene Adduci, PT, GCS 05/01/20,3:24 PM

## 2020-05-01 NOTE — TOC Progression Note (Signed)
Transition of Care (TOC) - Progression Note    Patient Details  Name: Dylan [name]. MRN: [id number] Date of Birth: October 06, 1941  Transition of Care [facility]) CM/SW Contact  Bartholomew Crews, RN Phone Number: [phone] 05/01/2020, 5:28 PM  Clinical Narrative:     Received call from spouse, [name], who stated that she visited facility and this is definitely where she wants patient to go for rehab.   Spoke with admissions at [facility]. Insurance authorization has been initiated. Advised that updated PT and OT were now in the chart. SNF to provide additional therapy notes to [facility].   TOC following for transition needs.   Expected Discharge Plan: Skilled Nursing Facility Barriers to Discharge: Insurance Authorization  Expected Discharge Plan and Services Expected Discharge Plan: [place] In-house Referral: Clinical Social Work Discharge Planning Services: CM Consult Post Acute Care Choice: [place] Living arrangements for the past 2 months: Single Family Home                 DME Arranged: N/A DME Agency: NA       HH Arranged: NA HH Agency: NA         Social Determinants of Health (SDOH) Interventions    Readmission Risk Interventions No flowsheet data found.

## 2020-05-01 NOTE — Progress Notes (Signed)
Occupational Therapy Treatment Patient Details Name: Dylan [name]. MRN: [id number] DOB: 1941-08-29 Today's Date: 05/01/2020    History of present illness Pt is a 79 y/o male presenting to the ED following fall and inability to ambulate. PMH includes Dementia, Parkinson's disease, HTN, DM, CKD, and s/p pacemaker.    OT comments  Pt reports need to void bowels but unable to void actual stool. Pt passing gas only at this time with a mucous like discharge. Pt remains appropriate for SNf as patient needs increased (A) for basic chair transfer max (A).    Follow Up Recommendations  SNF;Supervision/Assistance - 24 hour    Equipment Recommendations  Other (comment)    Recommendations for Other Services      Precautions / Restrictions Precautions Precautions: Fall       Mobility Bed Mobility               General bed mobility comments: oob in chair  Transfers Overall transfer level: Needs assistance Equipment used: Rolling walker (2 wheeled) Transfers: Sit to/from Stand Sit to Stand: Max assist         General transfer comment: pt requires (A) to power up from chair. pt with posterior directly pushing to power up    Balance Overall balance assessment: Needs assistance         Standing balance support: Bilateral upper extremity supported;During functional activity Standing balance-Leahy Scale: Poor Standing balance comment: reliant on RW                           ADL either performed or assessed with clinical judgement   ADL Overall ADL's : Needs assistance/impaired                         Toilet Transfer: Minimal assistance;Ambulation;BSC;RW   Toileting- Clothing Manipulation and Hygiene: Total assistance       Functional mobility during ADLs: Minimal assistance;Rolling walker       Vision       Perception     Praxis      Cognition Arousal/Alertness: Awake/alert Behavior During Therapy: Flat affect Overall Cognitive  Status: History of cognitive impairments - at baseline                                 General Comments: Dementia at baseline. Patient able to follow simple commands consistently and answer questions.  Not oriented to time but was to situation.  He became emotional after transfer to chair about being afraid his mobility was getting bad.  Crying because he knows his wife is struggling caring for him        Exercises     Shoulder Instructions       General Comments pt attempting to void and only producing a clear muscus like void no actual bowel movmenet    Pertinent Vitals/ Pain       Pain Assessment: No/denies pain  Home Living                                          Prior Functioning/Environment              Frequency  Min 2X/week        Progress Toward Goals  OT Goals(current goals can now  be found in the care plan section)  Progress towards OT goals: Progressing toward goals  Acute Rehab OT Goals Patient Stated Goal: none stated OT Goal Formulation: With patient Time For Goal Achievement: 05/12/20 Potential to Achieve Goals: Good ADL Goals Pt Will Perform Grooming: with supervision;standing Pt Will Transfer to Toilet: with min guard assist;ambulating Pt Will Perform Toileting - Clothing Manipulation and hygiene: with supervision;sitting/lateral leans Pt Will Perform Tub/Shower Transfer: with caregiver independent in assisting;3 in 1 Additional ADL Goal #1: Patient will independently verbalize 3 fall prevention strategies.  Plan Discharge plan remains appropriate    Co-evaluation                 AM-PAC OT "6 Clicks" Daily Activity     Outcome Measure   Help from another person eating meals?: None Help from another person taking care of personal grooming?: A Little Help from another person toileting, which includes using toliet, bedpan, or urinal?: A Lot Help from another person bathing (including washing, rinsing,  drying)?: A Lot Help from another person to put on and taking off regular upper body clothing?: A Little Help from another person to put on and taking off regular lower body clothing?: A Lot 6 Click Score: 16    End of Session Equipment Utilized During Treatment: Gait belt;Rolling walker  OT Visit Diagnosis: Unsteadiness on feet (R26.81);History of falling (Z91.81);Muscle weakness (generalized) (M62.81);Other symptoms and signs involving cognitive function;Pain Pain - part of body: Leg   Activity Tolerance Patient tolerated treatment well   Patient Left in chair;with call bell/phone within reach;with chair alarm set   Nurse Communication Mobility status        Time: 1435-1451 OT Time Calculation (min): 16 min  Charges: OT General Charges $OT Visit: 1 Visit OT Treatments $Self Care/Home Management : 8-22 mins   Brynn, OTR/L  Acute Rehabilitation Services Pager: [phone] Office: [phone] .    Jeri Modena 05/01/2020, 2:54 PM

## 2020-05-02 DIAGNOSIS — N1831 Chronic kidney disease, stage 3a: Secondary | ICD-10-CM

## 2020-05-02 LAB — GLUCOSE, CAPILLARY
Glucose-Capillary: 111 mg/dL — ABNORMAL HIGH (ref 70–99)
Glucose-Capillary: 131 mg/dL — ABNORMAL HIGH (ref 70–99)
Glucose-Capillary: 115 mg/dL — ABNORMAL HIGH (ref 70–99)
Glucose-Capillary: 129 mg/dL — ABNORMAL HIGH (ref 70–99)

## 2020-05-02 NOTE — Progress Notes (Signed)
Progress Note    Dylan [name].  GGY:[id number] DOB: May 10, 1941  DOA: 04/27/2020 PCP: [name], MD    Brief Narrative:     Medical records reviewed and are as summarized below:  Dylan [name]. is an 79 y.o. 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  Patient is poor historian. Patient's wife at bedside is the historian. She tells me that patient fell on the ground on Saturday for couple of hours while she was not at home in 1. She came and found him on the floor. She brought him to the ER on 6/27.His pacemaker was interrogated and patient discharged home in stable condition.   Assessment/Plan:   Principal Problem:   Syncope Active Problems:   Essential hypertension   Type 2 diabetes mellitus with stage 3a chronic kidney disease, without long-term current use of insulin (HCC)   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HCC)   Acute on chronic kidney failure (HCC)   Parkinsonism (HCC)   Elevated CK   Gout   Hyperlipemia   Syncope: -Patient is afebrile, no leukocytosis. UA, UDS: Negative for infection -CT head, chest x-ray, abdominal x-ray: Negative for acute findings. -Suspect related to hypotension and volume status  AKI on CKD stage IIIa likely prerenal in the setting of dehydration with poor oral intake: -Presented with creatinine: 3.47, GFR: 18 -Creatinine downtrending  to normal -Likely secondary to dehydration due to decreased p.o. intake. Continue to avoid nephrotoxins and hypotension  Essential hypertension, blood pressure currently soft -Hold home blood pressure medications  Elevated CK: -Likely secondary to fall -Trending down  Parkinson:  -Continue Sinemet -Makes him higher fall risk  Dementia:  -Continue Namenda  and rivastigmine  Complete heart block status post pacemaker placement: -Device interrogation was negative in ED -d/c tele  Hyperlipidemia: Continue statin  Type 2 diabetes mellitus: Hold Metformin and Trulicity -Hemoglobin O3J 7.5 on 03/24/2020.  Continue insulin sliding scale and avoid hypoglycemia  Physical deconditioning: -Consulted case manager for SNF placement as recommended by PT   Family Communication/Anticipated D/C date and plan/Code Status   DVT prophylaxis: Lovenox ordered. Code Status: Full Code.   Disposition Plan: Status is: Inpatient  Remains inpatient appropriate because: needs SNF   Dispo:  Patient From: Home  Planned Disposition: [place]  Expected discharge date: when bed available  Medically stable for discharge: yes, await SNF     Medical Consultants:    None.    Subjective:   No current complaints  Objective:    Vitals:   05/01/20 1631 05/01/20 2124 05/02/20 0500 05/02/20 0540  BP: 126/68 116/66  (!) 147/63  Pulse: 67 73  60  Resp: 16 18  16   Temp: 98 F (36.7 C) 98.5 F (36.9 C)  98.4 F (36.9 C)  TempSrc: Oral Oral  Oral  SpO2: 98% 98%  98%  Weight:  84.7 kg 84.7 kg   Height:        Intake/Output Summary (Last 24 hours) at 05/02/2020 0908 Last data filed at 05/02/2020 0600 Gross per 24 hour  Intake 780 ml  Output 1600 ml  Net -820 ml   Filed Weights   04/30/20 0447 05/01/20 2124 05/02/20 0500  Weight: 84.4 kg 84.7 kg 84.7 kg    Exam:   General: Appearance:     Overweight male in no acute distress  Lungs:     Clear to auscultation bilaterally, respirations unlabored  Heart:    Normal heart rate. Normal rhythm. No murmurs, rubs, or gallops.   MS:   All extremities are intact.   Neurologic: Awake and alert-- generally weak appearing     Data Reviewed:   I have personally reviewed following labs and imaging studies:  Labs: Labs show the following:   Basic Metabolic Panel: Recent  Labs  Lab 04/26/20 1825 04/26/20 1825 04/27/20 1556 04/27/20 1556 04/27/20 1855 04/28/20 0021 04/28/20 0021 04/29/20 1105 05/01/20 0427  NA 140  --  137  --   --  139  --  138 140  K 4.6   < > 4.4   < >  --  3.9   < > 4.3 3.3*  CL 103  --  101  --   --  107  --  107 103  CO2 22  --  22  --   --  22  --  20* 26  GLUCOSE 200*  --  219*  --   --  144*  --  169* 112*  BUN 21  --  46*  --   --  51*  --  21 23  CREATININE 1.40*   < > 3.47*  --  3.29* 3.10*  --  1.12 1.30*  CALCIUM 9.5  --  9.1  --   --  8.4*  --  9.1 9.5  MG  --   --   --   --  2.0  --   --   --   --   PHOS  --   --   --   --  3.9  --   --   --   --    < > = values in this interval not displayed.   GFR Estimated Creatinine Clearance: 49.9 mL/min (A) (by C-G formula based on SCr of 1.3 mg/dL (H)). Liver Function Tests: Recent Labs  Lab 04/26/20 1825 04/27/20 1556 04/28/20 0021  AST 101* 83* 74*  ALT 38 15 11  ALKPHOS 103 93 77  BILITOT 1.3* 0.7 0.8  PROT 7.2 6.9 5.9*  ALBUMIN 3.7 3.6 3.0*   No results for input(s): LIPASE, AMYLASE in the last 168 hours. No results for input(s): AMMONIA in the last 168 hours. Coagulation profile No results for input(s): INR, PROTIME in the last 168 hours.  CBC: Recent Labs  Lab 04/26/20 1825 04/27/20 1556 04/27/20 1855 04/28/20 0021  WBC 8.4 7.7 7.2 6.8  NEUTROABS  --  4.6  --   --   HGB 14.0 12.8* 11.7* 11.1*  HCT 43.1 40.0 36.1* 34.3*  MCV 89.0 89.3 88.3 87.9  PLT 169 150 145* 125*   Cardiac Enzymes: Recent Labs  Lab 04/27/20 1556 04/28/20 0021 05/01/20 0427  CKTOTAL 3,228* 2,382* 275   BNP (last 3 results) No results for input(s): PROBNP in the last 8760 hours. CBG: Recent Labs  Lab 05/01/20 0646 05/01/20 1127 05/01/20 1632 05/01/20 2126 05/02/20 0655  GLUCAP 89 110* 153* 114* 111*   D-Dimer: No results for input(s): DDIMER in the last 72 hours. Hgb A1c: No results for input(s): HGBA1C in the last 72 hours. Lipid Profile: No results for  input(s): CHOL, HDL, LDLCALC, TRIG, CHOLHDL, LDLDIRECT in the last 72 hours. Thyroid function studies: No results for input(s): TSH, T4TOTAL, T3FREE, THYROIDAB in the last 72 hours.  Invalid input(s): FREET3 Anemia work up: No results for input(s): VITAMINB12, FOLATE, FERRITIN, TIBC, IRON,  RETICCTPCT in the last 72 hours. Sepsis Labs: Recent Labs  Lab 04/26/20 1825 04/27/20 1556 04/27/20 1855 04/27/20 1950 04/28/20 0021  WBC 8.4 7.7 7.2  --  6.8  LATICACIDVEN  --   --   --  1.3 1.0    Microbiology Recent Results (from the past 240 hour(s))  Urine culture     Status: None   Collection Time: 04/27/20  4:04 PM   Specimen: Urine, Random  Result Value Ref Range Status   Specimen Description URINE, RANDOM  Final   Special Requests NONE  Final   Culture   Final    NO GROWTH Performed at [facility], [place] [address] [place], [place] [address]    Report Status 04/29/2020 FINAL  Final  SARS Coronavirus 2 by RT PCR (hospital order, performed in [facility] hospital lab) Nasopharyngeal Nasopharyngeal Swab     Status: None   Collection Time: 04/27/20  5:41 PM   Specimen: Nasopharyngeal Swab  Result Value Ref Range Status   SARS Coronavirus 2 NEGATIVE NEGATIVE Final    Comment: (NOTE) SARS-CoV-2 target nucleic acids are NOT DETECTED.  The SARS-CoV-2 RNA is generally detectable in upper and lower respiratory specimens during the acute phase of infection. The lowest concentration of SARS-CoV-2 viral copies this assay can detect is 250 copies / mL. A negative result does not preclude SARS-CoV-2 infection and should not be used as the sole basis for treatment or other patient management decisions.  A negative result may occur with improper specimen collection / handling, submission of specimen other than nasopharyngeal swab, presence of viral mutation(s) within the areas targeted by this assay, and inadequate number of viral copies (<250 copies / mL). A negative result must be  combined with clinical observations, patient history, and epidemiological information.  Fact Sheet for Patients:   StrictlyIdeas.no  Fact Sheet for Healthcare Providers: BankingDealers.co.za  This test is not yet approved or  cleared by the [place] FDA and has been authorized for detection and/or diagnosis of SARS-CoV-2 by FDA under an Emergency Use Authorization (EUA).  This EUA will remain in effect (meaning this test can be used) for the duration of the COVID-19 declaration under Section 564(b)(1) of the Act, 21 U.S.C. section 360bbb-3(b)(1), unless the authorization is terminated or revoked sooner.  Performed at [facility], [place] [address], [place], [place] [address]     Procedures and diagnostic studies:  No results found.  Medications:   . aspirin EC  81 mg Oral Q M,W,F  . carbidopa-levodopa  1.5 tablet Oral TID  . diclofenac Sodium  2 g Topical TID  . enoxaparin (LOVENOX) injection  40 mg Subcutaneous Q24H  . insulin aspart  0-15 Units Subcutaneous TID WC  . insulin aspart  0-5 Units Subcutaneous QHS  . melatonin  10 mg Oral QHS  . memantine  10 mg Oral BID  . rivastigmine  4.6 mg Transdermal Daily   Continuous Infusions:    LOS: 5 days   Geradine Girt  [facility]   How to contact the [facility] Attending or Consulting provider [place] or covering provider during after hours [place], for this patient?  1. Check the care team in [facility] and look for a) attending/consulting TRH provider listed and b) the [facility] team listed 2. Log into [email or web address] and use 's universal password to access. If you do not have the password, please contact the hospital operator. 3. Locate the [facility] provider you are looking  for under [facility] and page to a number that you can be directly reached. 4. If you still have difficulty reaching the provider, please page the [facility] (Director on Call) for the Hospitalists  listed on amion for assistance.  05/02/2020, 9:08 AM

## 2020-05-03 LAB — GLUCOSE, CAPILLARY
Glucose-Capillary: 111 mg/dL — ABNORMAL HIGH (ref 70–99)
Glucose-Capillary: 132 mg/dL — ABNORMAL HIGH (ref 70–99)
Glucose-Capillary: 137 mg/dL — ABNORMAL HIGH (ref 70–99)
Glucose-Capillary: 58 mg/dL — ABNORMAL LOW (ref 70–99)
Glucose-Capillary: 64 mg/dL — ABNORMAL LOW (ref 70–99)
Glucose-Capillary: 176 mg/dL — ABNORMAL HIGH (ref 70–99)

## 2020-05-03 NOTE — Progress Notes (Signed)
Progress Note    Dylan Ina.  JSE:[id number] DOB: May 05, 1941  DOA: 04/27/2020 PCP: [name], MD    Brief Narrative:     Medical records reviewed and are as summarized below:  Dylan Ina. is an 79 y.o. 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  Patient is poor historian. Patient's wife at bedside is the historian. She tells me that patient fell on the ground on Saturday for couple of hours while she was not at home in 1. She came and found him on the floor. She brought him to the ER on 6/27.His pacemaker was interrogated and patient discharged home in stable condition.   Assessment/Plan:   Principal Problem:   Syncope Active Problems:   Essential hypertension   Type 2 diabetes mellitus with stage 3a chronic kidney disease, without long-term current use of insulin (HCC)   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HCC)   Acute on chronic kidney failure (HCC)   Parkinsonism (HCC)   Elevated CK   Gout   Hyperlipemia   Syncope: -Patient is afebrile, no leukocytosis. UA, UDS: Negative for infection -CT head, chest x-ray, abdominal x-ray: Negative for acute findings. -Suspect related to hypotension and volume status  AKI on CKD stage IIIa likely prerenal in the setting of dehydration with poor oral intake: -Presented with creatinine: 3.47, GFR: 18 -Creatinine downtrending  to normal -Likely secondary to dehydration due to decreased p.o. intake. Continue to avoid nephrotoxins and hypotension  Essential hypertension, blood pressure currently soft -Hold home blood pressure medications  Elevated CK: -Likely secondary to fall -Trending down  Parkinson:  -Continue Sinemet -Makes him higher fall risk  Dementia:  -Continue Namenda  and rivastigmine  Complete heart block status post pacemaker placement: -Device interrogation was negative in ED -d/c tele  Hyperlipidemia: Continue statin  Type 2 diabetes mellitus: Hold Metformin and Trulicity -Hemoglobin W7P 7.5 on 03/24/2020.  Continue insulin sliding scale and avoid hypoglycemia  Physical deconditioning: -Consulted case manager for SNF placement as recommended by PT   Family Communication/Anticipated D/C date and plan/Code Status   DVT prophylaxis: Lovenox ordered. Code Status: Full Code.   Disposition Plan: Status is: Inpatient  Remains inpatient appropriate because: needs SNF   Dispo:  Patient From: Home  Planned Disposition: [place]  Expected discharge date: when bed available  Medically stable for discharge: yes, await SNF     Medical Consultants:    None.    Subjective:   No overnight events  Objective:    Vitals:   05/02/20 2154 05/02/20 2155 05/03/20 0123 05/03/20 0443  BP:  119/67  (!) 149/66  Pulse:  64  60  Resp:  16  14  Temp:  98.8 F (37.1 C)  98.7 F (37.1 C)  TempSrc:  Oral  Oral  SpO2:  98%  100%  Weight: 83.9 kg  83.9 kg   Height:        Intake/Output Summary (Last 24 hours) at 05/03/2020 1241 Last data filed at 05/03/2020 0600 Gross per 24 hour  Intake 480 ml  Output 750 ml  Net -270 ml   Filed Weights   05/02/20 0500 05/02/20 2154 05/03/20 0123  Weight: 84.7 kg 83.9 kg 83.9 kg    Exam:  In bed, NAD   Data Reviewed:   I have personally reviewed following labs and imaging  studies:  Labs: Labs show the following:   Basic Metabolic Panel: Recent Labs  Lab 04/26/20 1825 04/26/20 1825 04/27/20 1556 04/27/20 1556 04/27/20 1855 04/28/20 0021 04/28/20 0021 04/29/20 1105 05/01/20 0427  NA 140  --  137  --   --  139  --  138 140  K 4.6   < > 4.4   < >  --  3.9   < > 4.3 3.3*  CL 103  --  101  --   --  107  --  107 103  CO2 22  --  22  --   --  22  --  20* 26  GLUCOSE  200*  --  219*  --   --  144*  --  169* 112*  BUN 21  --  46*  --   --  51*  --  21 23  CREATININE 1.40*   < > 3.47*  --  3.29* 3.10*  --  1.12 1.30*  CALCIUM 9.5  --  9.1  --   --  8.4*  --  9.1 9.5  MG  --   --   --   --  2.0  --   --   --   --   PHOS  --   --   --   --  3.9  --   --   --   --    < > = values in this interval not displayed.   GFR Estimated Creatinine Clearance: 49.9 mL/min (A) (by C-G formula based on SCr of 1.3 mg/dL (H)). Liver Function Tests: Recent Labs  Lab 04/26/20 1825 04/27/20 1556 04/28/20 0021  AST 101* 83* 74*  ALT 38 15 11  ALKPHOS 103 93 77  BILITOT 1.3* 0.7 0.8  PROT 7.2 6.9 5.9*  ALBUMIN 3.7 3.6 3.0*   No results for input(s): LIPASE, AMYLASE in the last 168 hours. No results for input(s): AMMONIA in the last 168 hours. Coagulation profile No results for input(s): INR, PROTIME in the last 168 hours.  CBC: Recent Labs  Lab 04/26/20 1825 04/27/20 1556 04/27/20 1855 04/28/20 0021  WBC 8.4 7.7 7.2 6.8  NEUTROABS  --  4.6  --   --   HGB 14.0 12.8* 11.7* 11.1*  HCT 43.1 40.0 36.1* 34.3*  MCV 89.0 89.3 88.3 87.9  PLT 169 150 145* 125*   Cardiac Enzymes: Recent Labs  Lab 04/27/20 1556 04/28/20 0021 05/01/20 0427  CKTOTAL 3,228* 2,382* 275   BNP (last 3 results) No results for input(s): PROBNP in the last 8760 hours. CBG: Recent Labs  Lab 05/02/20 1202 05/02/20 1645 05/02/20 2152 05/03/20 0640 05/03/20 1106  GLUCAP 131* 115* 129* 111* 132*   D-Dimer: No results for input(s): DDIMER in the last 72 hours. Hgb A1c: No results for input(s): HGBA1C in the last 72 hours. Lipid Profile: No results for input(s): CHOL, HDL, LDLCALC, TRIG, CHOLHDL, LDLDIRECT in the last 72 hours. Thyroid function studies: No results for input(s): TSH, T4TOTAL, T3FREE, THYROIDAB in the last 72 hours.  Invalid input(s): FREET3 Anemia work up: No results for input(s): VITAMINB12, FOLATE, FERRITIN, TIBC, IRON, RETICCTPCT in the last 72 hours. Sepsis  Labs: Recent Labs  Lab 04/26/20 1825 04/27/20 1556 04/27/20 1855 04/27/20 1950 04/28/20 0021  WBC 8.4 7.7 7.2  --  6.8  LATICACIDVEN  --   --   --  1.3 1.0    Microbiology Recent Results (from the past 240 hour(s))  Urine culture  Status: None   Collection Time: 04/27/20  4:04 PM   Specimen: Urine, Random  Result Value Ref Range Status   Specimen Description URINE, RANDOM  Final   Special Requests NONE  Final   Culture   Final    NO GROWTH Performed at [facility], [address] [place], [place] [address]    Report Status 04/29/2020 FINAL  Final  SARS Coronavirus 2 by RT PCR (hospital order, performed in [facility] hospital lab) Nasopharyngeal Nasopharyngeal Swab     Status: None   Collection Time: 04/27/20  5:41 PM   Specimen: Nasopharyngeal Swab  Result Value Ref Range Status   SARS Coronavirus 2 NEGATIVE NEGATIVE Final    Comment: (NOTE) SARS-CoV-2 target nucleic acids are NOT DETECTED.  The SARS-CoV-2 RNA is generally detectable in upper and lower respiratory specimens during the acute phase of infection. The lowest concentration of SARS-CoV-2 viral copies this assay can detect is 250 copies / mL. A negative result does not preclude SARS-CoV-2 infection and should not be used as the sole basis for treatment or other patient management decisions.  A negative result may occur with improper specimen collection / handling, submission of specimen other than nasopharyngeal swab, presence of viral mutation(s) within the areas targeted by this assay, and inadequate number of viral copies (<250 copies / mL). A negative result must be combined with clinical observations, patient history, and epidemiological information.  Fact Sheet for Patients:   StrictlyIdeas.no  Fact Sheet for Healthcare Providers: BankingDealers.co.za  This test is not yet approved or  cleared by the [place] FDA and has been authorized  for detection and/or diagnosis of SARS-CoV-2 by FDA under an Emergency Use Authorization (EUA).  This EUA will remain in effect (meaning this test can be used) for the duration of the COVID-19 declaration under Section 564(b)(1) of the Act, 21 U.S.C. section 360bbb-3(b)(1), unless the authorization is terminated or revoked sooner.  Performed at [facility], [place] [address], [place], [place] [address]     Procedures and diagnostic studies:  No results found.  Medications:   . aspirin EC  81 mg Oral Q M,W,F  . carbidopa-levodopa  1.5 tablet Oral TID  . diclofenac Sodium  2 g Topical TID  . enoxaparin (LOVENOX) injection  40 mg Subcutaneous Q24H  . insulin aspart  0-15 Units Subcutaneous TID WC  . insulin aspart  0-5 Units Subcutaneous QHS  . melatonin  10 mg Oral QHS  . memantine  10 mg Oral BID  . rivastigmine  4.6 mg Transdermal Daily   Continuous Infusions:    LOS: 6 days   Dylan [name]  [facility]   How to contact the [facility] Attending or Consulting provider [place] or covering provider during after hours [place], for this patient?  1. Check the care team in [facility] and look for a) attending/consulting TRH provider listed and b) the [facility] team listed 2. Log into [email or web address] and use [place]'s universal password to access. If you do not have the password, please contact the hospital operator. 3. Locate the [facility] provider you are looking for under [facility] and page to a number that you can be directly reached. 4. If you still have difficulty reaching the provider, please page the [facility] (Director on Call) for the Hospitalists listed on amion for assistance.  05/03/2020, 12:41 PM

## 2020-05-04 LAB — BASIC METABOLIC PANEL
Anion gap: 7 (ref 5–15)
BUN: 20 mg/dL (ref 8–23)
CO2: 26 mmol/L (ref 22–32)
Calcium: 9.2 mg/dL (ref 8.9–10.3)
Chloride: 105 mmol/L (ref 98–111)
Creatinine, Ser: 1.13 mg/dL (ref 0.61–1.24)
GFR calc Af Amer: >60 mL/min (ref >60)
GFR calc non Af Amer: >60 mL/min (ref >60)
Glucose, Bld: 99 mg/dL (ref 70–99)
Potassium: 3.5 mmol/L (ref 3.5–5.1)
Sodium: 138 mmol/L (ref 135–145)

## 2020-05-04 LAB — GLUCOSE, CAPILLARY
Glucose-Capillary: 108 mg/dL — ABNORMAL HIGH (ref 70–99)
Glucose-Capillary: 127 mg/dL — ABNORMAL HIGH (ref 70–99)
Glucose-Capillary: 141 mg/dL — ABNORMAL HIGH (ref 70–99)
Glucose-Capillary: 86 mg/dL (ref 70–99)

## 2020-05-04 NOTE — Progress Notes (Signed)
Progress Note    Dylan Ina.  DZH:[id number] DOB: [birth year]  DOA: 04/27/2020 PCP: [name], MD    Brief Narrative:     Medical records reviewed and are as summarized below:  Dylan Ina. is an 79 y.o. 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  Patient is poor historian. Patient's wife at bedside is the historian. She tells me that patient fell on the ground on Saturday for couple of hours while she was not at home in 1. She came and found him on the floor. She brought him to the ER on 6/27.His pacemaker was interrogated and patient discharged home in stable condition.   Assessment/Plan:   Principal Problem:   Syncope Active Problems:   Essential hypertension   Type 2 diabetes mellitus with stage 3a chronic kidney disease, without long-term current use of insulin (HCC)   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HCC)   Acute on chronic kidney failure (HCC)   Parkinsonism (HCC)   Elevated CK   Gout   Hyperlipemia   Syncope: -Patient is afebrile, no leukocytosis. UA, UDS: Negative for infection -CT head, chest x-ray, abdominal x-ray: Negative for acute findings. -Suspect related to hypotension and volume status  AKI on CKD stage IIIa likely prerenal in the setting of dehydration with poor oral intake: -Presented with creatinine: 3.47, GFR: 18 -Creatinine downtrending  to normal -Likely secondary to dehydration due to decreased p.o. intake. Continue to avoid nephrotoxins and hypotension  Essential hypertension, blood pressure currently soft -Hold home blood pressure medications  Elevated CK: -Likely secondary to fall -Trending down  Parkinson:  -Continue Sinemet -Makes him higher fall risk  Dementia:  -Continue Namenda  and rivastigmine  Complete heart block status post pacemaker placement: -Device interrogation was negative in ED -d/c tele  Hyperlipidemia: Continue statin  Type 2 diabetes mellitus: Hold Metformin and Trulicity -Hemoglobin M1D 7.5 on 03/24/2020.  Continue insulin sliding scale and avoid hypoglycemia  Physical deconditioning: -Consulted case manager for SNF placement as recommended by PT   Family Communication/Anticipated D/C date and plan/Code Status   DVT prophylaxis: Lovenox ordered. Code Status: Full Code.  Disposition Plan: Status is: Inpatient  Remains inpatient appropriate because: needs SNF   Dispo:  Patient From: Home  Planned Disposition: [place]  Expected discharge date: when bed available  Medically stable for discharge: yes, await SNF     Medical Consultants:    None.    Subjective:   Continue to await insurance authorization  Objective:    Vitals:   05/03/20 2056 05/04/20 0155 05/04/20 0446 05/04/20 0900  BP: 126/63  (!) 143/57 140/62  Pulse: 73  64 70  Resp: 17  17 18   Temp: 97.8 F (36.6 C)  98.6 F (37 C) 97.8 F (36.6 C)  TempSrc:   Oral Oral  SpO2: 96%  96% 98%  Weight:  83.9 kg    Height:        Intake/Output Summary (Last 24 hours) at 05/04/2020 1225 Last data filed at 05/04/2020 0950 Gross per 24 hour  Intake 1140 ml  Output 3000 ml  Net -1860 ml   Filed Weights   05/02/20 2154 05/03/20 0123 05/04/20 0155  Weight: 83.9 kg 83.9 kg 83.9 kg    Exam:  In bed, NAD   Data Reviewed:   I have personally reviewed following  labs and imaging studies:  Labs: Labs show the following:   Basic Metabolic Panel: Recent Labs  Lab 04/27/20 1556 04/27/20 1556 04/27/20 1855 04/28/20 0021 04/28/20 0021 04/29/20 1105 04/29/20 1105 05/01/20 0427 05/04/20 0423  NA 137  --   --  139  --  138  --  140 138  K 4.4   < >  --  3.9   < > 4.3   < > 3.3* 3.5  CL 101  --   --  107  --  107  --  103 105  CO2 22   --   --  22  --  20*  --  26 26  GLUCOSE 219*  --   --  144*  --  169*  --  112* 99  BUN 46*  --   --  51*  --  21  --  23 20  CREATININE 3.47*   < > 3.29* 3.10*  --  1.12  --  1.30* 1.13  CALCIUM 9.1  --   --  8.4*  --  9.1  --  9.5 9.2  MG  --   --  2.0  --   --   --   --   --   --   PHOS  --   --  3.9  --   --   --   --   --   --    < > = values in this interval not displayed.   GFR Estimated Creatinine Clearance: 57.4 mL/min (by C-G formula based on SCr of 1.13 mg/dL). Liver Function Tests: Recent Labs  Lab 04/27/20 1556 04/28/20 0021  AST 83* 74*  ALT 15 11  ALKPHOS 93 77  BILITOT 0.7 0.8  PROT 6.9 5.9*  ALBUMIN 3.6 3.0*   No results for input(s): LIPASE, AMYLASE in the last 168 hours. No results for input(s): AMMONIA in the last 168 hours. Coagulation profile No results for input(s): INR, PROTIME in the last 168 hours.  CBC: Recent Labs  Lab 04/27/20 1556 04/27/20 1855 04/28/20 0021  WBC 7.7 7.2 6.8  NEUTROABS 4.6  --   --   HGB 12.8* 11.7* 11.1*  HCT 40.0 36.1* 34.3*  MCV 89.3 88.3 87.9  PLT 150 145* 125*   Cardiac Enzymes: Recent Labs  Lab 04/27/20 1556 04/28/20 0021 05/01/20 0427  CKTOTAL 3,228* 2,382* 275   BNP (last 3 results) No results for input(s): PROBNP in the last 8760 hours. CBG: Recent Labs  Lab 05/03/20 2053 05/03/20 2054 05/03/20 2150 05/04/20 0653 05/04/20 1106  GLUCAP 58* 64* 176* 108* 127*   D-Dimer: No results for input(s): DDIMER in the last 72 hours. Hgb A1c: No results for input(s): HGBA1C in the last 72 hours. Lipid Profile: No results for input(s): CHOL, HDL, LDLCALC, TRIG, CHOLHDL, LDLDIRECT in the last 72 hours. Thyroid function studies: No results for input(s): TSH, T4TOTAL, T3FREE, THYROIDAB in the last 72 hours.  Invalid input(s): FREET3 Anemia work up: No results for input(s): VITAMINB12, FOLATE, FERRITIN, TIBC, IRON, RETICCTPCT in the last 72 hours. Sepsis Labs: Recent Labs  Lab 04/27/20 1556  04/27/20 1855 04/27/20 1950 04/28/20 0021  WBC 7.7 7.2  --  6.8  LATICACIDVEN  --   --  1.3 1.0    Microbiology Recent Results (from the past 240 hour(s))  Urine culture     Status: None   Collection Time: 04/27/20  4:04 PM   Specimen: Urine, Random  Result Value Ref Range Status  Specimen Description URINE, RANDOM  Final   Special Requests NONE  Final   Culture   Final    NO GROWTH Performed at [facility], [place] [address], [place], [place] [address]    Report Status 04/29/2020 FINAL  Final  SARS Coronavirus 2 by RT PCR (hospital order, performed in [facility] hospital lab) Nasopharyngeal Nasopharyngeal Swab     Status: None   Collection Time: 04/27/20  5:41 PM   Specimen: Nasopharyngeal Swab  Result Value Ref Range Status   SARS Coronavirus 2 NEGATIVE NEGATIVE Final    Comment: (NOTE) SARS-CoV-2 target nucleic acids are NOT DETECTED.  The SARS-CoV-2 RNA is generally detectable in upper and lower respiratory specimens during the acute phase of infection. The lowest concentration of SARS-CoV-2 viral copies this assay can detect is 250 copies / mL. A negative result does not preclude SARS-CoV-2 infection and should not be used as the sole basis for treatment or other patient management decisions.  A negative result may occur with improper specimen collection / handling, submission of specimen other than nasopharyngeal swab, presence of viral mutation(s) within the areas targeted by this assay, and inadequate number of viral copies (<250 copies / mL). A negative result must be combined with clinical observations, patient history, and epidemiological information.  Fact Sheet for Patients:   StrictlyIdeas.no  Fact Sheet for Healthcare Providers: BankingDealers.co.za  This test is not yet approved or  cleared by the [place] FDA and has been authorized for detection and/or diagnosis of SARS-CoV-2 by FDA under an  Emergency Use Authorization (EUA).  This EUA will remain in effect (meaning this test can be used) for the duration of the COVID-19 declaration under Section 564(b)(1) of the Act, 21 U.S.C. section 360bbb-3(b)(1), unless the authorization is terminated or revoked sooner.  Performed at [facility], [place] [address] [place], [place] [address]     Procedures and diagnostic studies:  No results found.  Medications:    aspirin EC  81 mg Oral Q M,W,F   carbidopa-levodopa  1.5 tablet Oral TID   diclofenac Sodium  2 g Topical TID   enoxaparin (LOVENOX) injection  40 mg Subcutaneous Q24H   insulin aspart  0-15 Units Subcutaneous TID WC   insulin aspart  0-5 Units Subcutaneous QHS   melatonin  10 mg Oral QHS   memantine  10 mg Oral BID   rivastigmine  4.6 mg Transdermal Daily   Continuous Infusions:    LOS: 7 days   Geradine Girt  [facility]   How to contact the [facility] Attending or Consulting provider [place] or covering provider during after hours [place], for this patient?  1. Check the care team in [facility] and look for a) attending/consulting TRH provider listed and b) the [facility] team listed 2. Log into [email or web address] and use [place]'s universal password to access. If you do not have the password, please contact the hospital operator. 3. Locate the [facility] provider you are looking for under [facility] and page to a number that you can be directly reached. 4. If you still have difficulty reaching the provider, please page the [facility] (Director on Call) for the Hospitalists listed on amion for assistance.  05/04/2020, 12:25 PM

## 2020-05-05 ENCOUNTER — Ambulatory Visit: Payer: Medicare HMO | Admitting: Internal Medicine

## 2020-05-05 LAB — GLUCOSE, CAPILLARY
Glucose-Capillary: 91 mg/dL (ref 70–99)
Glucose-Capillary: 156 mg/dL — ABNORMAL HIGH (ref 70–99)
Glucose-Capillary: 119 mg/dL — ABNORMAL HIGH (ref 70–99)
Glucose-Capillary: 176 mg/dL — ABNORMAL HIGH (ref 70–99)

## 2020-05-05 NOTE — Progress Notes (Signed)
Physical Therapy Treatment Patient Details Name: Dylan [name]. MRN: [id number] DOB: 11-20-1940 Today's Date: 05/05/2020    History of Present Illness Pt is a 79 y/o male presenting to the ED following fall and inability to ambulate. PMH includes Dementia, Parkinson's disease, HTN, DM, CKD, and s/p pacemaker.     PT Comments    Patient received sleeping in bed. Easily roused to my voice. Patient agrees to PT. He requires min assist for supine to sit to raise trunk. Cues to sit edge of bed for a minute prior to standing. Patient requests bed height be elevated to assist with standing. He required min assist powering up to standing and initial balance. Ambulated 120 feet with RW and min guard. Cues for staying inside and close to RW. Decreased cadence. He will continue to benefit from skilled PT while here to improve strength and safety with mobility.       Follow Up Recommendations  SNF;Supervision/Assistance - 24 hour     Equipment Recommendations  None recommended by PT    Recommendations for Other Services       Precautions / Restrictions Precautions Precautions: Fall Restrictions Weight Bearing Restrictions: No    Mobility  Bed Mobility Overal bed mobility: Needs Assistance Bed Mobility: Supine to Sit     Supine to sit: Min assist        Transfers Overall transfer level: Needs assistance Equipment used: Rolling walker (2 wheeled) Transfers: Sit to/from Stand   Stand pivot transfers: Min assist;From elevated surface       General transfer comment: Requires cues for hand placement. Posterior lean with initial standing balance.  Ambulation/Gait Ambulation/Gait assistance: Min guard Gait Distance (Feet): 120 Feet Assistive device: Rolling walker (2 wheeled) Gait Pattern/deviations: Step-through pattern;Decreased stride length;Shuffle;Decreased dorsiflexion - left;Decreased dorsiflexion - right Gait velocity: decreased   General Gait Details: patient  requires cues to stay inside RW especially with turning.   Stairs             Wheelchair Mobility    Modified Rankin (Stroke Patients Only)       Balance Overall balance assessment: Needs assistance Sitting-balance support: Feet supported;Single extremity supported Sitting balance-Leahy Scale: Fair     Standing balance support: Bilateral upper extremity supported;During functional activity Standing balance-Leahy Scale: Fair Standing balance comment: reliant on RW and external assist for safety                            Cognition Arousal/Alertness: Awake/alert Behavior During Therapy: WFL for tasks assessed/performed Overall Cognitive Status: No family/caregiver present to determine baseline cognitive functioning                                 General Comments: Patient pleasant, in good mood.      Exercises      General Comments        Pertinent Vitals/Pain Pain Assessment: No/denies pain    Home Living                      Prior Function            PT Goals (current goals can now be found in the care plan section) Acute Rehab PT Goals Patient Stated Goal: to go to rehab to get stronger PT Goal Formulation: With patient Time For Goal Achievement: 05/11/20 Potential to Achieve Goals: Good Progress towards PT goals:  Progressing toward goals    Frequency    Min 2X/week      PT Plan Current plan remains appropriate    Co-evaluation              AM-PAC PT "6 Clicks" Mobility   Outcome Measure  Help needed turning from your back to your side while in a flat bed without using bedrails?: A Little Help needed moving from lying on your back to sitting on the side of a flat bed without using bedrails?: A Little Help needed moving to and from a bed to a chair (including a wheelchair)?: A Little Help needed standing up from a chair using your arms (e.g., wheelchair or bedside chair)?: A Little Help needed to  walk in hospital room?: A Little Help needed climbing 3-5 steps with a railing? : A Lot 6 Click Score: 17    End of Session Equipment Utilized During Treatment: Gait belt Activity Tolerance: Patient tolerated treatment well Patient left: Other (comment) (left on BSC with OT present to assist) Nurse Communication: Mobility status PT Visit Diagnosis: Muscle weakness (generalized) (M62.81);Other abnormalities of gait and mobility (R26.89);Difficulty in walking, not elsewhere classified (R26.2)     Time: 6579-0383 PT Time Calculation (min) (ACUTE ONLY): 18 min  Charges:  $Gait Training: 8-22 mins                     Melis Trochez, PT, GCS 05/05/20,10:39 AM

## 2020-05-05 NOTE — Plan of Care (Signed)
  Problem: Safety: Goal: Ability to remain free from injury will improve Outcome: Progressing   

## 2020-05-05 NOTE — Progress Notes (Signed)
Progress Note    Dylan Ina.  IRS:[id number] DOB: [birth year]  DOA: 04/27/2020 PCP: [name], MD    Brief Narrative:     Medical records reviewed and are as summarized below:  Dylan Ina. is an 79 y.o. 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  Patient is poor historian. Patient's wife at bedside is the historian. She tells me that patient fell on the ground on Saturday for couple of hours while she was not at home in 1. She came and found him on the floor. She brought him to the ER on 6/27.His pacemaker was interrogated and patient discharged home in stable condition.  He was evaluated by PT here and has been waiting for SNF placement.  Assessment/Plan:   Principal Problem:   Syncope Active Problems:   Essential hypertension   Type 2 diabetes mellitus with stage 3a chronic kidney disease, without long-term current use of insulin (HCC)   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HCC)   Acute on chronic kidney failure (HCC)   Parkinsonism (HCC)   Elevated CK   Gout   Hyperlipemia   Syncope: -Patient is afebrile, no leukocytosis. UA, UDS: Negative for infection -CT head, chest x-ray, abdominal x-ray: Negative for acute findings. -Suspect related to hypotension and volume status  AKI on CKD stage IIIa likely prerenal in the setting of dehydration with poor oral intake: -Presented with creatinine: 3.47, GFR: 18 -Creatinine downtrending  to normal -Likely secondary to dehydration due to decreased p.o. intake. Continue to avoid nephrotoxins and hypotension  Essential hypertension, blood pressure currently soft -Hold home blood pressure medications  Elevated CK: -Likely secondary to fall -Trending down  Parkinson:  -Continue  Sinemet -Makes him higher fall risk  Dementia:  -Continue Namenda and rivastigmine  Complete heart block status post pacemaker placement: -Device interrogation was negative in ED -d/c tele  Hyperlipidemia: Continue statin  Type 2 diabetes mellitus: Hold Metformin and Trulicity -Hemoglobin K0X 7.5 on 03/24/2020.  Continue insulin sliding scale and avoid hypoglycemia  Physical deconditioning: -Consulted case manager for SNF placement as recommended by PT   Family Communication/Anticipated D/C date and plan/Code Status   DVT prophylaxis: Lovenox ordered. Code Status: Full Code.  Disposition Plan: Status is: Inpatient  Remains inpatient appropriate because: needs SNF   Dispo:  Patient From: Home  Planned Disposition: [place]  Expected discharge date: when bed available  Medically stable for discharge: yes, await SNF     Medical Consultants:    None.    Subjective:   Continue to await insurance authorization  Objective:    Vitals:   05/04/20 2200 05/05/20 0533 05/05/20 0601 05/05/20 0955  BP: (!) 149/68 (!) 163/68  (!) 160/66  Pulse: 64 79  60  Resp: 18 17  16   Temp: 98.5 F (36.9 C) 98.4 F (36.9 C)  98.2 F (36.8 C)  TempSrc: Oral Oral  Oral  SpO2: 98% 97%  96%  Weight:   82.1 kg   Height:        Intake/Output Summary (Last 24 hours) at 05/05/2020 1126 Last data filed at 05/05/2020 0435 Gross per 24 hour  Intake 1000 ml  Output 2900 ml  Net -1900 ml   Filed Weights   05/03/20 0123 05/04/20 0155 05/05/20 0601  Weight: 83.9 kg 83.9 kg 82.1 kg    Exam:  In bed, able to lay flat w/o issue   Data Reviewed:   I have personally reviewed following labs and imaging studies:  Labs: Labs show the following:   Basic Metabolic Panel: Recent Labs  Lab 04/29/20 1105 04/29/20 1105 05/01/20 0427 05/04/20 0423  NA 138  --  140 138  K 4.3   < > 3.3* 3.5  CL 107  --  103 105  CO2 20*  --  26 26  GLUCOSE 169*  --  112* 99   BUN 21  --  23 20  CREATININE 1.12  --  1.30* 1.13  CALCIUM 9.1  --  9.5 9.2   < > = values in this interval not displayed.   GFR Estimated Creatinine Clearance: 57.4 mL/min (by C-G formula based on SCr of 1.13 mg/dL). Liver Function Tests: No results for input(s): AST, ALT, ALKPHOS, BILITOT, PROT, ALBUMIN in the last 168 hours. No results for input(s): LIPASE, AMYLASE in the last 168 hours. No results for input(s): AMMONIA in the last 168 hours. Coagulation profile No results for input(s): INR, PROTIME in the last 168 hours.  CBC: No results for input(s): WBC, NEUTROABS, HGB, HCT, MCV, PLT in the last 168 hours. Cardiac Enzymes: Recent Labs  Lab 05/01/20 0427  CKTOTAL 275   BNP (last 3 results) No results for input(s): PROBNP in the last 8760 hours. CBG: Recent Labs  Lab 05/04/20 1106 05/04/20 1615 05/04/20 2202 05/05/20 0639 05/05/20 1116  GLUCAP 127* 141* 86 91 156*   D-Dimer: No results for input(s): DDIMER in the last 72 hours. Hgb A1c: No results for input(s): HGBA1C in the last 72 hours. Lipid Profile: No results for input(s): CHOL, HDL, LDLCALC, TRIG, CHOLHDL, LDLDIRECT in the last 72 hours. Thyroid function studies: No results for input(s): TSH, T4TOTAL, T3FREE, THYROIDAB in the last 72 hours.  Invalid input(s): FREET3 Anemia work up: No results for input(s): VITAMINB12, FOLATE, FERRITIN, TIBC, IRON, RETICCTPCT in the last 72 hours. Sepsis Labs: No results for input(s): PROCALCITON, WBC, LATICACIDVEN in the last 168 hours.  Microbiology Recent Results (from the past 240 hour(s))  Urine culture     Status: None   Collection Time: 04/27/20  4:04 PM   Specimen: Urine, Random  Result Value Ref Range Status   Specimen Description URINE, RANDOM  Final   Special Requests NONE  Final   Culture   Final    NO GROWTH Performed at [facility], [address] [place], [place] [address]    Report Status 04/29/2020 FINAL  Final  SARS Coronavirus 2 by  RT PCR (hospital order, performed in [facility] hospital lab) Nasopharyngeal Nasopharyngeal Swab     Status: None   Collection Time: 04/27/20  5:41 PM   Specimen: Nasopharyngeal Swab  Result Value Ref Range Status   SARS Coronavirus 2 NEGATIVE NEGATIVE Final    Comment: (NOTE) SARS-CoV-2 target nucleic acids are NOT DETECTED.  The SARS-CoV-2 RNA is generally detectable in upper and lower respiratory specimens during the acute phase of infection. The lowest concentration of SARS-CoV-2 viral copies this assay can detect is 250 copies / mL. A negative result does not preclude SARS-CoV-2 infection and should not be used as the sole basis for treatment or other patient management decisions.  A negative result may occur with improper specimen collection / handling, submission of specimen other than nasopharyngeal swab, presence of viral mutation(s) within the areas targeted by this assay, and inadequate number of viral copies (<250 copies / mL).  A negative result must be combined with clinical observations, patient history, and epidemiological information.  Fact Sheet for Patients:   StrictlyIdeas.no  Fact Sheet for Healthcare Providers: BankingDealers.co.za  This test is not yet approved or  cleared by the [place] FDA and has been authorized for detection and/or diagnosis of SARS-CoV-2 by FDA under an Emergency Use Authorization (EUA).  This EUA will remain in effect (meaning this test can be used) for the duration of the COVID-19 declaration under Section 564(b)(1) of the Act, 21 U.S.C. section 360bbb-3(b)(1), unless the authorization is terminated or revoked sooner.  Performed at [facility], [place] [address] [place], [place] [address]     Procedures and diagnostic studies:  No results found.  Medications:   . aspirin EC  81 mg Oral Q M,W,F  . carbidopa-levodopa  1.5 tablet Oral TID  . diclofenac Sodium  2 g  Topical TID  . enoxaparin (LOVENOX) injection  40 mg Subcutaneous Q24H  . insulin aspart  0-15 Units Subcutaneous TID WC  . insulin aspart  0-5 Units Subcutaneous QHS  . melatonin  10 mg Oral QHS  . memantine  10 mg Oral BID  . rivastigmine  4.6 mg Transdermal Daily   Continuous Infusions:    LOS: 8 days   Geradine Girt  [facility]   How to contact the [facility] Attending or Consulting provider [place] or covering provider during after hours [place], for this patient?  1. Check the care team in [facility] and look for a) attending/consulting TRH provider listed and b) the [facility] team listed 2. Log into [email or web address] and use [place]'s universal password to access. If you do not have the password, please contact the hospital operator. 3. Locate the [facility] provider you are looking for under [facility] and page to a number that you can be directly reached. 4. If you still have difficulty reaching the provider, please page the [facility] (Director on Call) for the Hospitalists listed on amion for assistance.  05/05/2020, 11:26 AM

## 2020-05-05 NOTE — TOC Progression Note (Signed)
Transition of Care (TOC) - Progression Note    Patient Details  Name: Dylan [name]. MRN: [id number] Date of Birth: 1941/08/26  Transition of Care [facility]) CM/SW Contact  Bartholomew Crews, RN Phone Number: [phone] 05/05/2020, 10:04 AM  Clinical Narrative:     Received phone call from [place] at [facility]. [employer] is requesting updated therapy notes. PT and OT to see patient today. TOC following for transition needs.   Expected Discharge Plan: Skilled Nursing Facility Barriers to Discharge: Insurance Authorization  Expected Discharge Plan and Services Expected Discharge Plan: [place] In-house Referral: Clinical Social Work Discharge Planning Services: CM Consult Post Acute Care Choice: [place] Living arrangements for the past 2 months: Single Family Home                 DME Arranged: N/A DME Agency: NA       HH Arranged: NA HH Agency: NA         Social Determinants of Health (SDOH) Interventions    Readmission Risk Interventions No flowsheet data found.

## 2020-05-05 NOTE — Progress Notes (Signed)
Occupational Therapy Treatment Patient Details Name: Dylan [name]. MRN: [id number] DOB: 1941-02-26 Today's Date: 05/05/2020    History of present illness Pt is a 79 y/o male presenting to the ED following fall and inability to ambulate. PMH includes Dementia, Parkinson's disease, HTN, DM, CKD, and s/p pacemaker.    OT comments  Patient walking with PT on arrival.  Patient able to walk with min guard and RW.  Requesting to use the bathroom and patient required max assist with toileting.  Cueing for hand placement with transfers and walker use, and required min assist to stand.  Patient following commands well.  He did become emotional at one point though unclear what about, resolved quickly.   Recommend SNF and continued acute OT to work on building strength and activity tolerance for improved ADL independence.   Follow Up Recommendations  SNF;Supervision/Assistance - 24 hour    Equipment Recommendations  Other (comment)    Recommendations for Other Services      Precautions / Restrictions Precautions Precautions: Fall Restrictions Weight Bearing Restrictions: No       Mobility Bed Mobility Overal bed mobility: Needs Assistance Bed Mobility: Supine to Sit     Supine to sit: Min assist     General bed mobility comments: oob walking with PT when arrived  Transfers Overall transfer level: Needs assistance Equipment used: Rolling walker (2 wheeled) Transfers: Sit to/from Stand Sit to Stand: Min assist;From elevated surface Stand pivot transfers: Min assist;From elevated surface       General transfer comment: Requires cues for hand placement. Posterior lean with initial standing balance.    Balance Overall balance assessment: Needs assistance Sitting-balance support: Feet supported;Single extremity supported Sitting balance-Leahy Scale: Fair     Standing balance support: Bilateral upper extremity supported;During functional activity Standing balance-Leahy Scale:  Fair Standing balance comment: reliant on RW and external assist for safety                           ADL either performed or assessed with clinical judgement   ADL Overall ADL's : Needs assistance/impaired     Grooming: Set up;Sitting                   Toilet Transfer: Minimal assistance;Ambulation;BSC;RW   Toileting- Clothing Manipulation and Hygiene: Maximal assistance;Sit to/from stand       Functional mobility during ADLs: Minimal assistance;Rolling walker       Vision       Perception     Praxis      Cognition Arousal/Alertness: Awake/alert Behavior During Therapy: WFL for tasks assessed/performed Overall Cognitive Status: History of cognitive impairments - at baseline                                 General Comments: Patient pleasant, in good mood for mot of session. Had one instant of becoming emotional but passed quickly        Exercises     Shoulder Instructions       General Comments      Pertinent Vitals/ Pain       Pain Assessment: Faces Faces Pain Scale: Hurts a little bit Pain Location: L side rib area Pain Descriptors / Indicators: Sore Pain Intervention(s): Monitored during session  Home Living  Prior Functioning/Environment              Frequency  Min 2X/week        Progress Toward Goals  OT Goals(current goals can now be found in the care plan section)  Progress towards OT goals: Progressing toward goals  Acute Rehab OT Goals Patient Stated Goal: to go to rehab to get stronger OT Goal Formulation: With patient Time For Goal Achievement: 05/12/20 Potential to Achieve Goals: Good  Plan Discharge plan remains appropriate    Co-evaluation                 AM-PAC OT "6 Clicks" Daily Activity     Outcome Measure   Help from another person eating meals?: None Help from another person taking care of personal grooming?: A  Little Help from another person toileting, which includes using toliet, bedpan, or urinal?: A Lot Help from another person bathing (including washing, rinsing, drying)?: A Lot Help from another person to put on and taking off regular upper body clothing?: A Little Help from another person to put on and taking off regular lower body clothing?: A Lot 6 Click Score: 16    End of Session Equipment Utilized During Treatment: Gait belt;Rolling walker  OT Visit Diagnosis: Unsteadiness on feet (R26.81);History of falling (Z91.81);Muscle weakness (generalized) (M62.81);Other symptoms and signs involving cognitive function;Pain Pain - Right/Left: Left Pain - part of body:  (side of abdomen)   Activity Tolerance Patient tolerated treatment well   Patient Left in chair;with call bell/phone within reach;with chair alarm set   Nurse Communication Mobility status        Time: 0459-9774 OT Time Calculation (min): 24 min  Charges: OT General Charges $OT Visit: 1 Visit OT Treatments $Self Care/Home Management : 23-37 mins  August Luz, OTR/L    Phylliss Bob 05/05/2020, 11:35 AM

## 2020-05-05 NOTE — Plan of Care (Signed)
°  Problem: Safety: °Goal: Ability to remain free from injury will improve °Outcome: Progressing °  °Problem: Activity: °Goal: Risk for activity intolerance will decrease °Outcome: Progressing °  °

## 2020-05-06 ENCOUNTER — Telehealth: Payer: Medicare HMO

## 2020-05-06 ENCOUNTER — Ambulatory Visit: Payer: Self-pay

## 2020-05-06 DIAGNOSIS — N1831 Chronic kidney disease, stage 3a: Secondary | ICD-10-CM | POA: Diagnosis not present

## 2020-05-06 DIAGNOSIS — G20 Parkinson's disease: Secondary | ICD-10-CM

## 2020-05-06 DIAGNOSIS — I10 Essential (primary) hypertension: Secondary | ICD-10-CM | POA: Diagnosis not present

## 2020-05-06 DIAGNOSIS — N183 Chronic kidney disease, stage 3 unspecified: Secondary | ICD-10-CM | POA: Diagnosis not present

## 2020-05-06 DIAGNOSIS — E1122 Type 2 diabetes mellitus with diabetic chronic kidney disease: Secondary | ICD-10-CM

## 2020-05-06 DIAGNOSIS — E1121 Type 2 diabetes mellitus with diabetic nephropathy: Secondary | ICD-10-CM | POA: Diagnosis not present

## 2020-05-06 DIAGNOSIS — R413 Other amnesia: Secondary | ICD-10-CM

## 2020-05-06 DIAGNOSIS — I442 Atrioventricular block, complete: Secondary | ICD-10-CM

## 2020-05-06 DIAGNOSIS — G20C Parkinsonism, unspecified: Secondary | ICD-10-CM

## 2020-05-06 LAB — SARS CORONAVIRUS 2 (TAT 6-24 HRS): SARS Coronavirus 2: NEGATIVE

## 2020-05-06 LAB — BASIC METABOLIC PANEL
Anion gap: 10 (ref 5–15)
BUN: 16 mg/dL (ref 8–23)
CO2: 24 mmol/L (ref 22–32)
Calcium: 9.1 mg/dL (ref 8.9–10.3)
Chloride: 101 mmol/L (ref 98–111)
Creatinine, Ser: 1.12 mg/dL (ref 0.61–1.24)
GFR calc Af Amer: >60 mL/min (ref >60)
GFR calc non Af Amer: >60 mL/min (ref >60)
Glucose, Bld: 213 mg/dL — ABNORMAL HIGH (ref 70–99)
Potassium: 3.5 mmol/L (ref 3.5–5.1)
Sodium: 135 mmol/L (ref 135–145)

## 2020-05-06 LAB — CBC
HCT: 36.1 % — ABNORMAL LOW (ref 39.0–52.0)
Hemoglobin: 11.7 g/dL — ABNORMAL LOW (ref 13.0–17.0)
MCH: 28.6 pg (ref 26.0–34.0)
MCHC: 32.4 g/dL (ref 30.0–36.0)
MCV: 88.3 fL (ref 80.0–100.0)
Platelets: 203 10*3/uL (ref 150–400)
RBC: 4.09 MIL/uL — ABNORMAL LOW (ref 4.22–5.81)
RDW: 12.4 % (ref 11.5–15.5)
WBC: 4.3 10*3/uL (ref 4.0–10.5)
nRBC: 0 % (ref 0.0–0.2)

## 2020-05-06 LAB — GLUCOSE, CAPILLARY
Glucose-Capillary: 85 mg/dL (ref 70–99)
Glucose-Capillary: 124 mg/dL — ABNORMAL HIGH (ref 70–99)
Glucose-Capillary: 113 mg/dL — ABNORMAL HIGH (ref 70–99)
Glucose-Capillary: 164 mg/dL — ABNORMAL HIGH (ref 70–99)

## 2020-05-06 NOTE — TOC Progression Note (Addendum)
Transition of Care (TOC) - Progression Note    Patient Details  Name: Dylan [name]. MRN: [id number] Date of Birth: Feb 15, 1941  Transition of Care [facility]) CM/SW Contact  Sharlet Salina Mila Homer, LCSW Phone Number: 05/06/2020, 12:11 PM  Clinical Narrative:  Received a call from Cleon Dew, admissions director [phone]) at [employer] called to advise CSW that [employer] denied patient for ST rehab. The attending can request a peer-to-peer with Dr, Jerene Pitch by calling [phone] - select option 3 by 4:30 pm.  Dr. Broadus John provided with the information to appeal. CSW will continue to follow.  1:02 pm: Received call from MD re: Peer-to-Peer: [facility] is requesting clinicals be faxed to them. Contacted Cleon Dew at [employer] request. He later provided CSW with information to fax clinicals to [facility]: Fax number - [phone] and Ref [id number]. Clinicals faxed to [employer] at 2:21 pm. CSW will continue to follow and await decision from [place]. .    Expected Discharge Plan: Skilled Nursing Facility Barriers to Discharge: Insurance Authorization  Expected Discharge Plan and Services Expected Discharge Plan: [place] In-house Referral: Clinical Social Work Discharge Planning Services: CM Consult Post Acute Care Choice: [place] Living arrangements for the past 2 months: Single Family Home                 DME Arranged: N/A DME Agency: NA       HH Arranged: NA HH Agency: NA         Social Determinants of Health (SDOH) Interventions  None requested or needed at this time.  Readmission Risk Interventions No flowsheet data found.

## 2020-05-06 NOTE — Progress Notes (Signed)
Occupational Therapy Treatment Patient Details Name: Dylan [name]. MRN: [id number] DOB: 09/10/1941 Today's Date: 05/06/2020    History of present illness Pt is a 79 y/o male presenting to the ED following fall and inability to ambulate. PMH includes Dementia, Parkinson's disease, HTN, DM, CKD, and s/p pacemaker.    OT comments  Pt received asleep in supine upon OTA arrival however agreeable to OOB activity with OTA. Overall, pt requires MIN A for functional mobility with RW. Pt continues to present with baseline cognitive impairments, impaired balance and decreased activity tolerance impacting pts ability to complete BADLs. Pt completed UB ADLS with supervision - MIN guard and LB ADLs with MAX A. Pt continues to be slightly confused but pleasant during session. Dc plan remains appropriate, will follow acutely per POC.   Follow Up Recommendations  SNF;Supervision/Assistance - 24 hour    Equipment Recommendations  Other (comment)    Recommendations for Other Services      Precautions / Restrictions Precautions Precautions: Fall Restrictions Weight Bearing Restrictions: No       Mobility Bed Mobility Overal bed mobility: Needs Assistance Bed Mobility: Supine to Sit     Supine to sit: Min assist;HOB elevated     General bed mobility comments: MIN A to scoot hips to EOB;pt using bed rails to elevate trunk  Transfers Overall transfer level: Needs assistance Equipment used: Rolling walker (2 wheeled) Transfers: Sit to/from Stand Sit to Stand: Min assist;Mod assist         General transfer comment: cues for hand placement each trial; MIN A to stand from EOB; MOD A from [facility] with heavy posterior lean    Balance Overall balance assessment: Needs assistance Sitting-balance support: Feet supported;Single extremity supported Sitting balance-Leahy Scale: Fair     Standing balance support: Bilateral upper extremity supported;During functional activity Standing balance-Leahy  Scale: Poor Standing balance comment: reliant on RW and external assist for safety                           ADL either performed or assessed with clinical judgement   ADL Overall ADL's : Needs assistance/impaired         Upper Body Bathing: Supervision/ safety;Set up Upper Body Bathing Details (indicate cue type and reason): from sitting on BSC Lower Body Bathing: Supervison/ safety;Set up;Min guard Lower Body Bathing Details (indicate cue type and reason): from sitting on BSC; min guard when reaching out of BOS Upper Body Dressing : Minimal assistance;Sitting Upper Body Dressing Details (indicate cue type and reason): to don new gown Lower Body Dressing: Maximal assistance;Sitting/lateral leans Lower Body Dressing Details (indicate cue type and reason): to don new socks Toilet Transfer: Minimal assistance;Ambulation;BSC;RW;Moderate assistance Toilet Transfer Details (indicate cue type and reason): MIN for RW mgmt, MIN directional cues. MOD A to stand to RW and control descent onto sitting surface   Toileting - Clothing Manipulation Details (indicate cue type and reason): pt unable to void     Functional mobility during ADLs: Minimal assistance;Rolling walker;Cueing for safety General ADL Comments: pt condom cath came off during session therefore pt incontinent during ambulation.     Vision       Perception     Praxis      Cognition Arousal/Alertness: Awake/alert;Lethargic (lethargic upon arrival but awakens more as session progressed) Behavior During Therapy: WFL for tasks assessed/performed Overall Cognitive Status: History of cognitive impairments - at baseline  General Comments: pt very pleasant and at first wondering thinking OTA was pts RN, however at end of session pt asking when therapy will be back as pt reports haven't been out of bed since yesterday        Exercises     Shoulder Instructions        General Comments pt reports needing to void bowels requesting to sit on [facility] however unable to void. pts condom cath fell off during session resulting in incontience episode    Pertinent Vitals/ Pain       Pain Assessment: Faces Faces Pain Scale: Hurts a little bit Pain Location: low back Pain Descriptors / Indicators: Sore;Discomfort Pain Intervention(s): Limited activity within patient's tolerance;Monitored during session;Repositioned;Other (comment) (reposition OOB in chair)  Home Living                                          Prior Functioning/Environment              Frequency  Min 2X/week        Progress Toward Goals  OT Goals(current goals can now be found in the care plan section)  Progress towards OT goals: Progressing toward goals  Acute Rehab OT Goals Patient Stated Goal: to go to rehab to get stronger OT Goal Formulation: With patient Time For Goal Achievement: 05/12/20 Potential to Achieve Goals: Good  Plan Discharge plan remains appropriate;Frequency remains appropriate    Co-evaluation                 AM-PAC OT "6 Clicks" Daily Activity     Outcome Measure   Help from another person eating meals?: None Help from another person taking care of personal grooming?: A Little Help from another person toileting, which includes using toliet, bedpan, or urinal?: A Lot Help from another person bathing (including washing, rinsing, drying)?: A Lot Help from another person to put on and taking off regular upper body clothing?: A Little Help from another person to put on and taking off regular lower body clothing?: A Lot 6 Click Score: 16    End of Session Equipment Utilized During Treatment: Gait belt;Rolling walker;Other (comment) (BSC)  OT Visit Diagnosis: Unsteadiness on feet (R26.81);History of falling (Z91.81);Muscle weakness (generalized) (M62.81);Other symptoms and signs involving cognitive function;Pain Pain - Right/Left:  Left   Activity Tolerance Patient tolerated treatment well   Patient Left in chair;with call bell/phone within reach;with chair alarm set   Nurse Communication Mobility status;Other (comment) (needs new condom cath)        Time: 2549-8264 OT Time Calculation (min): 23 min  Charges: OT General Charges $OT Visit: 1 Visit OT Treatments $Self Care/Home Management : 23-37 mins  Lanier Clam., COTA/L Acute Rehabilitation Services [phone]    Ihor Gully 05/06/2020, 4:44 PM

## 2020-05-06 NOTE — Progress Notes (Signed)
Progress Note    Dylan [name].  VZD:[id number] DOB: [birth year]  DOA: 04/27/2020 PCP: [name], MD    Brief Narrative:     Medical records reviewed and are as summarized below:  Dylan [name]. is an 79 y.o. 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 Patient is poor historian. Patient's wife at bedside is the historian. She tells me that patient fell on the ground on Saturday for couple of hours while she was not at home in 1. She came and found him on the floor. She brought him to the ER on 6/27.His pacemaker was interrogated and patient discharged home in stable condition. He was evaluated by PT here and has been waiting for SNF placement.  Assessment/Plan:    Syncope: -could be Orthostatic  -Patient is afebrile, no leukocytosis. UA, UDS: Negative for infection -CT head, chest x-ray, abdominal x-ray: Negative for acute findings. -Suspect related to hypotension likely orthostatic from Parkinsons/meds -Completed pacemaker interrogation in the emergency room which was unremarkable -PT OT eval completed due to overall debility, gait dysfunction and severe deconditioning, SNF was recommended for short-term rehab -I was just notified by social work regarding denial by his insurance, will contact his insurance service for peer-to-peer review, if this is declined will need home health services  AKI on CKD stage IIIa likely prerenal in the setting of dehydration with poor oral intake: -Presented with creatinine: 3.47, GFR: 18 -Resolved with hydration, ARB and Lasix discontinued  Essential hypertension -Stopped home BP meds  Elevated CK: -Likely secondary to fall -Trending down  Parkinson:  -Continue Sinemet -Makes him higher  fall risk  Dementia:  -Continue Namenda and rivastigmine  Complete heart block status post pacemaker placement: -Device interrogation was negative in ED -d/c tele  Hyperlipidemia: Continue statin  Type 2 diabetes mellitus: Hold Metformin and Trulicity -Hemoglobin I9J 7.5 on 03/24/2020.  Continue insulin sliding scale and avoid hypoglycemia  Physical deconditioning: -Consulted case manager for SNF placement as recommended by PT   Family Communication/Anticipated D/C date and plan/Code Status   DVT prophylaxis: Lovenox ordered. Code Status: Full Code.  Disposition Plan: Status is: Inpatient Family: updated wife Remains inpatient appropriate because: unsafe discharge  Dispo: Patient From: Home  Planned Disposition: [place]  Expected discharge date: Unsafe discharge plan, wife unable to get him up  Medically stable for discharge: Yes needs rehab    Medical Consultants:    None.    Subjective:   -No events overnight, cognitive deficits noted  Objective:    Vitals:   05/05/20 0601 05/05/20 0955 05/05/20 1633 05/05/20 2038  BP:  (!) 160/66 (!) 161/70 (!) 151/51  Pulse:  60 62 63  Resp:  16 18 18   Temp:  98.2 F (36.8 C) 98.6 F (37 C) 98.8 F (37.1 C)  TempSrc:  Oral Oral Oral  SpO2:  96% 99% 100%  Weight: 82.1 kg     Height:        Intake/Output Summary (Last 24 hours) at 05/06/2020 1229 Last data filed at 05/06/2020 0900 Gross per 24 hour  Intake 460 ml  Output 800 ml  Net -340 ml   Filed Weights   05/03/20 0123 05/04/20 0155 05/05/20 0601  Weight: 83.9 kg 83.9 kg 82.1 kg    Exam: General, elderly pleasant male laying in bed, awake alert oriented  to self and only partly to place CVS: S1-S2, regular rate rhythm Lungs: Clear Abdomen: Soft, nontender Extremities: No edema Psych: Poor insight and judgment  Data Reviewed:   I have personally reviewed following labs and imaging studies:  Labs: Labs show the following:    Basic Metabolic Panel: Recent Labs  Lab 05/01/20 0427 05/01/20 0427 05/04/20 0423 05/06/20 0809  NA 140  --  138 135  K 3.3*   < > 3.5 3.5  CL 103  --  105 101  CO2 26  --  26 24  GLUCOSE 112*  --  99 213*  BUN 23  --  20 16  CREATININE 1.30*  --  1.13 1.12  CALCIUM 9.5  --  9.2 9.1   < > = values in this interval not displayed.   GFR Estimated Creatinine Clearance: 57.9 mL/min (by C-G formula based on SCr of 1.12 mg/dL). Liver Function Tests: No results for input(s): AST, ALT, ALKPHOS, BILITOT, PROT, ALBUMIN in the last 168 hours. No results for input(s): LIPASE, AMYLASE in the last 168 hours. No results for input(s): AMMONIA in the last 168 hours. Coagulation profile No results for input(s): INR, PROTIME in the last 168 hours.  CBC: Recent Labs  Lab 05/06/20 0809  WBC 4.3  HGB 11.7*  HCT 36.1*  MCV 88.3  PLT 203   Cardiac Enzymes: Recent Labs  Lab 05/01/20 0427  CKTOTAL 275   BNP (last 3 results) No results for input(s): PROBNP in the last 8760 hours. CBG: Recent Labs  Lab 05/05/20 1116 05/05/20 1634 05/05/20 2037 05/06/20 0636 05/06/20 1122  GLUCAP 156* 119* 176* 85 124*   D-Dimer: No results for input(s): DDIMER in the last 72 hours. Hgb A1c: No results for input(s): HGBA1C in the last 72 hours. Lipid Profile: No results for input(s): CHOL, HDL, LDLCALC, TRIG, CHOLHDL, LDLDIRECT in the last 72 hours. Thyroid function studies: No results for input(s): TSH, T4TOTAL, T3FREE, THYROIDAB in the last 72 hours.  Invalid input(s): FREET3 Anemia work up: No results for input(s): VITAMINB12, FOLATE, FERRITIN, TIBC, IRON, RETICCTPCT in the last 72 hours. Sepsis Labs: Recent Labs  Lab 05/06/20 0809  WBC 4.3    Microbiology Recent Results (from the past 240 hour(s))  Urine culture     Status: None   Collection Time: 04/27/20  4:04 PM   Specimen: Urine, Random  Result Value Ref Range Status   Specimen Description URINE, RANDOM  Final   Special  Requests NONE  Final   Culture   Final    NO GROWTH Performed at [facility], [address] [place], [place] [address]    Report Status 04/29/2020 FINAL  Final  SARS Coronavirus 2 by RT PCR (hospital order, performed in [facility] hospital lab) Nasopharyngeal Nasopharyngeal Swab     Status: None   Collection Time: 04/27/20  5:41 PM   Specimen: Nasopharyngeal Swab  Result Value Ref Range Status   SARS Coronavirus 2 NEGATIVE NEGATIVE Final    Comment: (NOTE) SARS-CoV-2 target nucleic acids are NOT DETECTED.  The SARS-CoV-2 RNA is generally detectable in upper and lower respiratory specimens during the acute phase of infection. The lowest concentration of SARS-CoV-2 viral copies this assay can detect is 250 copies / mL. A negative result does not preclude SARS-CoV-2 infection and should not be used as the sole basis for treatment or other patient management decisions.  A negative result may occur with improper specimen collection / handling, submission of specimen other than  nasopharyngeal swab, presence of viral mutation(s) within the areas targeted by this assay, and inadequate number of viral copies (<250 copies / mL). A negative result must be combined with clinical observations, patient history, and epidemiological information.  Fact Sheet for Patients:   StrictlyIdeas.no  Fact Sheet for Healthcare Providers: BankingDealers.co.za  This test is not yet approved or  cleared by the [place] FDA and has been authorized for detection and/or diagnosis of SARS-CoV-2 by FDA under an Emergency Use Authorization (EUA).  This EUA will remain in effect (meaning this test can be used) for the duration of the COVID-19 declaration under Section 564(b)(1) of the Act, 21 U.S.C. section 360bbb-3(b)(1), unless the authorization is terminated or revoked sooner.  Performed at [facility], [place] [address], [place],  [place] [address]     Procedures and diagnostic studies:  No results found.  Medications:   . aspirin EC  81 mg Oral Q M,W,F  . carbidopa-levodopa  1.5 tablet Oral TID  . diclofenac Sodium  2 g Topical TID  . enoxaparin (LOVENOX) injection  40 mg Subcutaneous Q24H  . insulin aspart  0-15 Units Subcutaneous TID WC  . insulin aspart  0-5 Units Subcutaneous QHS  . melatonin  10 mg Oral QHS  . memantine  10 mg Oral BID  . rivastigmine  4.6 mg Transdermal Daily   Continuous Infusions:    LOS: 9 days   Domenic Polite  [facility]  05/06/2020, 12:29 PM

## 2020-05-07 LAB — GLUCOSE, CAPILLARY
Glucose-Capillary: 96 mg/dL (ref 70–99)
Glucose-Capillary: 118 mg/dL — ABNORMAL HIGH (ref 70–99)
Glucose-Capillary: 122 mg/dL — ABNORMAL HIGH (ref 70–99)
Glucose-Capillary: 129 mg/dL — ABNORMAL HIGH (ref 70–99)

## 2020-05-07 MED ORDER — ACETAMINOPHEN 325 MG PO TABS: 650.0000 mg | ORAL_TABLET | Freq: Four times a day (QID) | ORAL | Status: DC | PRN

## 2020-05-07 MED ORDER — CARVEDILOL 25 MG PO TABS
12.5000 mg | ORAL_TABLET | Freq: Two times a day (BID) | ORAL | Status: DC
Start: 2020-05-07 — End: 2020-06-08

## 2020-05-07 NOTE — Plan of Care (Signed)
  Problem: Health Behavior/Discharge Planning: Goal: Ability to manage health-related needs will improve Outcome: Progressing   Problem: Clinical Measurements: Goal: Diagnostic test results will improve Outcome: Progressing   

## 2020-05-07 NOTE — Plan of Care (Signed)
  Problem: Health Behavior/Discharge Planning: Goal: Ability to manage health-related needs will improve Outcome: Progressing   Problem: Clinical Measurements: Goal: Diagnostic test results will improve Outcome: Progressing Goal: Respiratory complications will improve Outcome: Progressing Goal: Cardiovascular complication will be avoided Outcome: Progressing   Problem: Activity: Goal: Risk for activity intolerance will decrease Outcome: Progressing   Problem: Nutrition: Goal: Adequate nutrition will be maintained Outcome: Progressing   Problem: Coping: Goal: Level of anxiety will decrease Outcome: Progressing

## 2020-05-07 NOTE — TOC Progression Note (Addendum)
Transition of Care (TOC) - Progression Note  *Insurance Authorization Pending with [employer]   Patient Details  Name: Dare Sanger. MRN: [id number] Date of Birth: 06/17/1941  Transition of Care [facility]) CM/SW Contact  Sharlet Salina Mila Homer, LCSW Phone Number: 05/07/2020, 11:18 AM  Clinical Narrative:  Received call from [facility] with [place]. They are reviewing the clinicals and we will hear back in 72 hours or less, probably Friday (7/9). Was provided with the Case ID; [id number]. Contact made with Cleon Dew, admissions director at [facility] and message left regarding call from [place]. CSW will continue to follow.  2:28 pm - Room Visit with patient and wife: CSW and Nurse Case Manager Wendi visited with patient and wife at the bedside to discuss his discharge. Mr. Filler was sitting in a chair and was alert, but quiet during the conversation. When asked a question, he hesitated a moment, then turned it over to his wife to answer. Patient and wife updated on [employer]'s denial, peer-to-peer and subsequent request for additional information that was faxed to them on 7/7. Wife concerned and indicated that due to her age and health issues, she cannot care for her husband at home at this time and there is no one else that can assist her and this was discussed. Wife did report that her husband got out of bed by himself today.  Mrs. Magos had been told about [facility] by someone in her PCP's office and requested additional information regarding this service and nurse case manager advise wife that she would get information on this. Wife also requested information on private pay costs for SNF for rehab/LTC, lists of Assisted Living Facilities in [place] and [place] counties and information on the PACE program and was informed by CSW that this information would be gathered and provided to her.   2:30 pm - Voice Mail from [place] with [employer]. She asked if patient has been seen by PT/OT on  7/7 or 7/8 and needs updated therapy and MD notes. Requested that clinicals be faxed to [facility] [employer], fax [phone]. Her phone number is [phone]. Checked Epic for PT/OT notes.  3:40 pm - Received call from [place] regarding additional information request. She asked that PT/OT evals be sent as they need information on patient's prior level of functioning. Information faxed to [facility] after phone call.   3:53 pm - Visited room and wife had left. CSW will hold unto information she requested and call her on Friday.          Expected Discharge Plan: Skilled Nursing Facility Barriers to Discharge: Insurance Authorization  Expected Discharge Plan and Services Expected Discharge Plan: [place] In-house Referral: Clinical Social Work Discharge Planning Services: CM Consult Post Acute Care Choice: [place] Living arrangements for the past 2 months: Single Family Home                 DME Arranged: N/A DME Agency: NA       HH Arranged: NA HH Agency: NA         Social Determinants of Health (SDOH) Interventions    Readmission Risk Interventions No flowsheet data found.

## 2020-05-07 NOTE — Discharge Summary (Signed)
Physician Discharge Summary  Dylan [name]. OEV:[id number] DOB: 1941-02-22 DOA: 04/27/2020  PCP: [name], MD  Admit date: 04/27/2020 Discharge date: 05/07/2020  Time spent: 35 minutes  Recommendations for Outpatient Follow-up:  1. PCP in 1 week 2. Please consider ongoing goals of care discussions 3. Palliative evaluation as outpatient   Discharge Diagnoses:  Principal Problem:   Syncope Orthostatic hypotension Acute kidney injury Dementia Parkinson's disease   Essential hypertension   Type 2 diabetes mellitus with stage 3a chronic kidney disease, without long-term current use of insulin (HCC)   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HCC)   AKI (acute kidney injury) ([place])   Elevated CK   Gout   Hyperlipemia   Discharge Condition: Stable  Diet recommendation: Low-sodium, diabetic  Filed Weights   05/04/20 0155 05/05/20 0601 05/07/20 0449  Weight: 83.9 kg 82.1 kg 81.9 kg    History of present illness:  Dylan Stuard. is an 79 y.o. male with medical history significant ofcomplete heart block status post pacemaker placement, aortic stenosis, Parkinson disease, hypertension, hyperlipidemia, diabetes mellitus, gout, dementia, CKD stage IIIb brought by EMS. Patient is poor historian.  She reported that patient fell on the ground on Saturday for couple of hours while she was not at home in 1. She came and found him on the floor. He was unable to get up and move  Hospital Course:   Syncope: -Secondary to orthostatic  hypotension and dehydration -Patient is afebrile, no leukocytosis. UA, UDS: Negative for infection -CT head, chest x-ray, abdominal x-ray: Negative for acute findings. -Completed pacemaker interrogation in the emergency room which was unremarkable -PT OT eval completed due to overall debility, gait dysfunction and severe deconditioning, SNF was recommended for short-term rehab  AKI on CKD stage IIIalikely prerenal in the setting of dehydration with poor oral  intake: -Presented with creatinine: 3.47, GFR: 18 -Resolved with hydration, ARB and Lasix discontinued -Monitor volume status closely and determine regarding resumption of diuretics in the future at a lower dose  Essential hypertension -Discontinued ARB, amlodipine and diuretics due to orthostatic hypotension primarily secondary to Parkinson's disease, dopaminergic meds etc  Elevated CK: -Likely secondary to fall -Trending down  Parkinson:  -Continue Sinemet -Makes him higher fall risk  Dementia:  -Continue Namenda and rivastigmine  Complete heart block status post pacemaker placement: -Device interrogation was negative in ED  Hyperlipidemia: Continue statin  Type 2 diabetes mellitus:  -Hemoglobin A1c 7.5 on 03/24/2020. -Resume oral hypoglycemics at discharge  Physical deconditioning: -Plan for rehabilitation  Ethics: This is a chronically ill elderly male with dementia, Parkinson's disease, malnutrition and multiple medical problems, needs further goals of care discussions with his wife -Recommend palliative care evaluation as outpatient  Discharge Exam: Vitals:   05/07/20 0449 05/07/20 0801  BP: 124/76 (!) 146/66  Pulse: 65 68  Resp: 18 18  Temp: 98.2 F (36.8 C) 98.5 F (36.9 C)  SpO2: 97% 97%    General: Awake alert, oriented to self and only partly to place, cognitive deficits noted Cardiovascular: S1-S2, regular rate rhythm Respiratory: Clear  Discharge Instructions    Allergies as of 05/07/2020   No Known Allergies     Medication List    STOP taking these medications   amLODipine 5 MG tablet Commonly known as: NORVASC   valsartan 320 MG tablet Commonly known as: DIOVAN     TAKE these medications   acetaminophen 325 MG tablet Commonly known as: TYLENOL Take 2 tablets (650 mg total) by mouth every 6 (six) hours as needed  for mild pain (or Fever >/= 101).   allopurinol 100 MG tablet Commonly known as: ZYLOPRIM Take 2 tablets (200  mg total) by mouth daily. What changed: when to take this   aspirin 81 MG tablet Take 81 mg by mouth every Monday, Wednesday, and Friday.   carbidopa-levodopa 25-100 MG tablet Commonly known as: SINEMET IR Take 1.5 tablets by mouth 3 (three) times daily. What changed:   when to take this  additional instructions   carvedilol 25 MG tablet Commonly known as: COREG Take 0.5 tablets (12.5 mg total) by mouth 2 (two) times daily. What changed: how much to take   Coenzyme Q10 200 MG capsule Take 200 mg by mouth daily with supper.   Colcrys 0.6 MG tablet Generic drug: colchicine Take 1 tablet (0.6 mg total) by mouth daily. What changed:   when to take this  reasons to take this   diclofenac Sodium 1 % Gel Commonly known as: Voltaren Apply 2 g topically 3 (three) times daily.   Lancets Misc Please fill basic/generic push button lancets.  Patient to test blood sugar twice daily. DX: E11.65   Melatonin 10 MG Tabs Take 10 mg by mouth at bedtime.   memantine 10 MG tablet Commonly known as: NAMENDA Take 1 tablet (10 mg total) by mouth 2 (two) times daily.   metFORMIN 1000 MG tablet Commonly known as: GLUCOPHAGE TAKE 1 TABLET BY MOUTH EVERY DAY What changed: when to take this   [place] LANCING DEV Misc Please fill ONE TOUCH DELICA LANCING DEVICE.  Use to test blood sugar twice daily as directed. E11.65   OneTouch Verio test strip Generic drug: glucose blood Use as instructed to check blood sugars daily dx: e11.22   pravastatin 20 MG tablet Commonly known as: PRAVACHOL TAKE 1 TABLET BY ORAL ROUTE EVERY DAY What changed: See the new instructions.   rivastigmine 4.6 mg/24hr Commonly known as: Exelon Place 1 patch (4.6 mg total) onto the skin daily. What changed: when to take this   Trulicity 1.5 WJ/1.9JY Sopn Generic drug: Dulaglutide INJECT 1.5 MG INTO THE SKIN ONCE A WEEK. What changed: See the new instructions.   vitamin C 1000 MG tablet Take 1,000  mg by mouth every morning.   Vitamin D3 125 MCG (5000 UT) Caps Take 5,000 Units by mouth daily with supper.      No Known Allergies  Contact information for follow-up providers    [name], MD Follow up in 1 week(s).   Specialty: Internal Medicine Contact information: [address] [place] [address] [phone]        Adrian Prows, MD .   Specialty: Cardiology Contact information: [place] [address] [phone]            Contact information for after-discharge care    Destination    [facility] Preferred SNF .   Service: Skilled Nursing Contact information: [address] [place] [phone]                   The results of significant diagnostics from this hospitalization (including imaging, microbiology, ancillary and laboratory) are listed below for reference.    Significant Diagnostic Studies: DG Ribs Bilateral W/Chest  Result Date: 04/27/2020 CLINICAL DATA:  Rib pain after fall EXAM: BILATERAL RIBS AND CHEST - 4+ VIEW COMPARISON:  04/26/2020 FINDINGS: Single-view chest demonstrates stable left-sided pacing device. Stable cardiomediastinal silhouette with aortic atherosclerosis. No focal  opacity, pleural effusion or pneumothorax. Bilateral rib series demonstrates no acute displaced rib fracture. IMPRESSION: Negative. Electronically Signed   By: Donavan Foil M.D.   On: 04/27/2020 17:02   CT HEAD WO CONTRAST  Result Date: 04/27/2020 CLINICAL DATA:  Head trauma EXAM: CT HEAD WITHOUT CONTRAST TECHNIQUE: Contiguous axial images were obtained from the base of the skull through the vertex without intravenous contrast. COMPARISON:  06/07/2019 FINDINGS: Brain: Mild age related volume loss. No acute intracranial abnormality. Specifically, no hemorrhage, hydrocephalus, mass lesion, acute infarction, or significant intracranial injury. Vascular: No hyperdense vessel or  unexpected calcification. Skull: No acute calvarial abnormality. Sinuses/Orbits: Visualized paranasal sinuses and mastoids clear. Orbital soft tissues unremarkable. Other: None IMPRESSION: No acute intracranial abnormality. Electronically Signed   By: Rolm Baptise M.D.   On: 04/27/2020 17:12   DG Chest Port 1 View  Result Date: 04/26/2020 CLINICAL DATA:  Unwitnessed fall, found down. EXAM: PORTABLE CHEST 1 VIEW COMPARISON:  Chest and rib radiographs 10/20/2019 FINDINGS: Left-sided pacemaker in place. Upper normal heart size with unchanged mediastinal contours. Aortic atherosclerosis. The lungs are clear. Pulmonary vasculature is normal. No consolidation, pleural effusion, or pneumothorax. No acute osseous abnormalities are seen. IMPRESSION: No acute chest findings. Electronically Signed   By: Keith Rake M.D.   On: 04/26/2020 20:31   DG Abd 2 Views  Result Date: 04/27/2020 CLINICAL DATA:  Fall with rib pain EXAM: ABDOMEN - 2 VIEW COMPARISON:  None. FINDINGS: Mild diffuse gaseous prominence of the bowel without obstructive pattern. Moderate stool in the right colon. There is no evidence of free air. No radio-opaque calculi or other significant radiographic abnormality is seen. IMPRESSION: Mild diffuse gaseous prominence of the bowel without obstructive pattern. Electronically Signed   By: Donavan Foil M.D.   On: 04/27/2020 17:05   ECHOCARDIOGRAM COMPLETE  Result Date: 04/28/2020    ECHOCARDIOGRAM REPORT   Patient Name:   Dylan Cheramie. Date of Exam: 04/28/2020 Medical Rec #:  [id number]          Height:       71.0 in Accession #:    [id number]         Weight:       183.9 lb Date of Birth:  June 28, 1941          BSA:          2.035 m Patient Age:    23 years           BP:           128/71 mmHg Patient Gender: M                  HR:           73 bpm. Exam Location:  Inpatient Procedure: 2D Echo, Cardiac Doppler and Color Doppler Indications:    Syncope  History:        Patient has prior history of  Echocardiogram examinations, most                 recent 06/14/2018. Pacemaker, Aortic Valve Disease,                 Arrythmias:CHB; Risk Factors:Hypertension, Dyslipidemia and                 Diabetes. CKD.  Sonographer:    Dustin Flock Referring Phys: [id number] [place]  1. Left ventricular ejection fraction, by estimation, is 50 to 55%. The left ventricle has low normal function. The left ventricle  has no regional wall motion abnormalities. Left ventricular diastolic parameters are consistent with Grade I diastolic dysfunction (impaired relaxation).  2. Right ventricular systolic function is normal. The right ventricular size is normal. There is moderately elevated pulmonary artery systolic pressure.  3. The mitral valve is normal in structure. Mild mitral valve regurgitation.  4. The aortic valve is tricuspid. Aortic valve regurgitation is mild. Mild to moderate aortic valve sclerosis/calcification is present, without any evidence of aortic stenosis. FINDINGS  Left Ventricle: Left ventricular ejection fraction, by estimation, is 50 to 55%. The left ventricle has low normal function. The left ventricle has no regional wall motion abnormalities. The left ventricular internal cavity size was small. There is no left ventricular hypertrophy. Abnormal (paradoxical) septal motion, consistent with RV pacemaker. Left ventricular diastolic parameters are consistent with Grade I diastolic dysfunction (impaired relaxation). Normal left ventricular filling pressure. Right Ventricle: The right ventricular size is normal. No increase in right ventricular wall thickness. Right ventricular systolic function is normal. There is moderately elevated pulmonary artery systolic pressure. The tricuspid regurgitant velocity is 3.42 m/s, and with an assumed right atrial pressure of 3 mmHg, the estimated right ventricular systolic pressure is 48.0 mmHg. Left Atrium: Left atrial size was normal in size. Right Atrium:  Right atrial size was normal in size. Pericardium: There is no evidence of pericardial effusion. Mitral Valve: The mitral valve is normal in structure. Mild mitral valve regurgitation. Tricuspid Valve: The tricuspid valve is normal in structure. Tricuspid valve regurgitation is mild. Aortic Valve: The aortic valve is tricuspid. Aortic valve regurgitation is mild. Aortic regurgitation PHT measures 683 msec. Mild to moderate aortic valve sclerosis/calcification is present, without any evidence of aortic stenosis. Aortic valve mean gradient measures 9.4 mmHg. Aortic valve peak gradient measures 17.6 mmHg. Aortic valve area, by VTI measures 2.08 cm. Pulmonic Valve: The pulmonic valve was not well visualized. Pulmonic valve regurgitation is not visualized. Aorta: The aortic root is normal in size and structure. IAS/Shunts: No atrial level shunt detected by color flow Doppler. Additional Comments: A pacer wire is visualized.  LEFT VENTRICLE PLAX 2D LVIDd:         4.90 cm  Diastology LVIDs:         3.30 cm  LV e' lateral:   7.18 cm/s LV PW:         1.00 cm  LV E/e' lateral: 8.2 LV IVS:        1.00 cm  LV e' medial:    5.44 cm/s LVOT diam:     2.20 cm  LV E/e' medial:  10.9 LV SV:         94 LV SV Index:   46 LVOT Area:     3.80 cm  RIGHT VENTRICLE RV Basal diam:  2.90 cm RV S prime:     10.70 cm/s TAPSE (M-mode): 2.6 cm LEFT ATRIUM              Index       RIGHT ATRIUM           Index LA diam:        3.30 cm  1.62 cm/m  RA Area:     18.90 cm LA Vol (A2C):   60.6 ml  29.79 ml/m RA Volume:   53.90 ml  26.49 ml/m LA Vol (A4C):   113.0 ml 55.54 ml/m LA Biplane Vol: 82.9 ml  40.75 ml/m  AORTIC VALVE AV Area (Vmax):    1.87 cm AV Area (Vmean):  1.91 cm AV Area (VTI):     2.08 cm AV Vmax:           209.64 cm/s AV Vmean:          144.890 cm/s AV VTI:            0.449 m AV Peak Grad:      17.6 mmHg AV Mean Grad:      9.4 mmHg LVOT Vmax:         103.00 cm/s LVOT Vmean:        72.900 cm/s LVOT VTI:          0.246 m  LVOT/AV VTI ratio: 0.55 AI PHT:            683 msec  AORTA Ao Root diam: 2.80 cm MITRAL VALVE                TRICUSPID VALVE MV Area (PHT): 5.66 cm     TR Peak grad:   46.8 mmHg MV Decel Time: 134 msec     TR Vmax:        342.00 cm/s MV E velocity: 59.10 cm/s MV A velocity: 116.00 cm/s  SHUNTS MV E/A ratio:  0.51         Systemic VTI:  0.25 m                             Systemic Diam: 2.20 cm Dani Gobble Croitoru MD Electronically signed by Sanda Klein MD Signature Date/Time: 04/28/2020/11:55:34 AM    Final    CT Renal Stone Study  Result Date: 04/27/2020 CLINICAL DATA:  Acute renal failure.  Fall yesterday. EXAM: CT ABDOMEN AND PELVIS WITHOUT CONTRAST TECHNIQUE: Multidetector CT imaging of the abdomen and pelvis was performed following the standard protocol without IV contrast. COMPARISON:  Radiograph earlier today. FINDINGS: Lower chest: No basilar pneumothorax or focal airspace disease. Pacemaker partially included. Coronary artery calcifications. No pleural fluid. Remote posterior left eighth rib fracture. No evidence of acute fracture of lower ribs. Hepatobiliary: Few coarse calcifications in the central dome of the liver. No evidence of focal lesion on noncontrast exam. No evidence of perihepatic hematoma. Gallbladder physiologically distended, no calcified stone. No biliary dilatation. Pancreas: No ductal dilatation or inflammation. Spleen: Normal in size without focal abnormality. Adrenals/Urinary Tract: Mild left greater than right adrenal thickening without dominant nodule. There is no hydronephrosis. Minimal symmetric perinephric edema. No renal calculi. No obvious focal renal mass on noncontrast exam. Both ureters are decompressed. Urinary bladder is partially distended. No bladder wall thickening. Stomach/Bowel: Small hiatal hernia. Stomach is decompressed. Normal positioning of the duodenum and ligament of Treitz. There is no small bowel obstruction or inflammation. Normal appendix. Large volume of  colonic stool, greater in the right colon. Significant tortuosity of the transverse and sigmoid colon. Mild gaseous distension of tortuous sigmoid colon without wall thickening or inflammation. No significant diverticular disease. Vascular/Lymphatic: Aortic atherosclerosis. Moderate branch atherosclerosis. There is no portal venous or mesenteric gas. No bulky abdominopelvic adenopathy. Reproductive: Enlarged prostate gland spanning 5 cm transverse. Other: No free air, free fluid, or intra-abdominal fluid collection. Musculoskeletal: Remote L4 compression fracture with vertebroplasty. Remote minor L3 superior endplate compression fracture. Minimally displaced fracture through the anterior inferior aspect of L1 vertebral body appears acute. Trace loss of height inferiorly. There is no posterior element involvement. There is trabecular coarsening involving the right iliac bone suggestive of underlying Paget's. Degenerative change of both hips. IMPRESSION: 1. No  explanation for acute renal failure. 2. Minimally displaced fracture through the anterior inferior aspect of L1 vertebral body appears acute. Trace loss of height inferiorly. No posterior element involvement. Recommend correlation for back pain/focal tenderness given recent fall. 3. Remote L4 and L3 compression fractures. 4. Large volume of colonic stool, greater in the right colon, can be seen with constipation. Diffuse colonic tortuosity. 5. Enlarged prostate gland. Aortic Atherosclerosis (ICD10-I70.0). Electronically Signed   By: Keith Rake M.D.   On: 04/27/2020 19:26    Microbiology: Recent Results (from the past 240 hour(s))  Urine culture     Status: None   Collection Time: 04/27/20  4:04 PM   Specimen: Urine, Random  Result Value Ref Range Status   Specimen Description URINE, RANDOM  Final   Special Requests NONE  Final   Culture   Final    NO GROWTH Performed at [facility], [address] [place], [place] [address]    Report  Status 04/29/2020 FINAL  Final  SARS Coronavirus 2 by RT PCR (hospital order, performed in [facility] hospital lab) Nasopharyngeal Nasopharyngeal Swab     Status: None   Collection Time: 04/27/20  5:41 PM   Specimen: Nasopharyngeal Swab  Result Value Ref Range Status   SARS Coronavirus 2 NEGATIVE NEGATIVE Final    Comment: (NOTE) SARS-CoV-2 target nucleic acids are NOT DETECTED.  The SARS-CoV-2 RNA is generally detectable in upper and lower respiratory specimens during the acute phase of infection. The lowest concentration of SARS-CoV-2 viral copies this assay can detect is 250 copies / mL. A negative result does not preclude SARS-CoV-2 infection and should not be used as the sole basis for treatment or other patient management decisions.  A negative result may occur with improper specimen collection / handling, submission of specimen other than nasopharyngeal swab, presence of viral mutation(s) within the areas targeted by this assay, and inadequate number of viral copies (<250 copies / mL). A negative result must be combined with clinical observations, patient history, and epidemiological information.  Fact Sheet for Patients:   StrictlyIdeas.no  Fact Sheet for Healthcare Providers: BankingDealers.co.za  This test is not yet approved or  cleared by the [place] FDA and has been authorized for detection and/or diagnosis of SARS-CoV-2 by FDA under an Emergency Use Authorization (EUA).  This EUA will remain in effect (meaning this test can be used) for the duration of the COVID-19 declaration under Section 564(b)(1) of the Act, 21 U.S.C. section 360bbb-3(b)(1), unless the authorization is terminated or revoked sooner.  Performed at [facility], [place] [address] [place], [place] [address]   SARS CORONAVIRUS 2 (TAT 6-24 HRS) Nasopharyngeal Nasopharyngeal Swab     Status: None   Collection Time: 05/06/20  6:54 PM    Specimen: Nasopharyngeal Swab  Result Value Ref Range Status   SARS Coronavirus 2 NEGATIVE NEGATIVE Final    Comment: (NOTE) SARS-CoV-2 target nucleic acids are NOT DETECTED.  The SARS-CoV-2 RNA is generally detectable in upper and lower respiratory specimens during the acute phase of infection. Negative results do not preclude SARS-CoV-2 infection, do not rule out co-infections with other pathogens, and should not be used as the sole basis for treatment or other patient management decisions. Negative results must be combined with clinical observations, patient history, and epidemiological information. The expected result is Negative.  Fact Sheet for Patients: SugarRoll.be  Fact Sheet for Healthcare Providers: https://www.woods-mathews.com/  This test is not yet approved or cleared by the [place]  FDA and  has been authorized for detection and/or diagnosis of SARS-CoV-2 by FDA under an Emergency Use Authorization (EUA). This EUA will remain  in effect (meaning this test can be used) for the duration of the COVID-19 declaration under Se ction 564(b)(1) of the Act, 21 U.S.C. section 360bbb-3(b)(1), unless the authorization is terminated or revoked sooner.  Performed at [facility], [place] [address], [place], [place] [address]      Labs: Basic Metabolic Panel: Recent Labs  Lab 05/01/20 0427 05/04/20 0423 05/06/20 0809  NA 140 138 135  K 3.3* 3.5 3.5  CL 103 105 101  CO2 26 26 24   GLUCOSE 112* 99 213*  BUN 23 20 16   CREATININE 1.30* 1.13 1.12  CALCIUM 9.5 9.2 9.1   Liver Function Tests: No results for input(s): AST, ALT, ALKPHOS, BILITOT, PROT, ALBUMIN in the last 168 hours. No results for input(s): LIPASE, AMYLASE in the last 168 hours. No results for input(s): AMMONIA in the last 168 hours. CBC: Recent Labs  Lab 05/06/20 0809  WBC 4.3  HGB 11.7*  HCT 36.1*  MCV 88.3  PLT 203   Cardiac Enzymes: Recent Labs   Lab 05/01/20 0427  CKTOTAL 275   BNP: BNP (last 3 results) No results for input(s): BNP in the last 8760 hours.  ProBNP (last 3 results) No results for input(s): PROBNP in the last 8760 hours.  CBG: Recent Labs  Lab 05/06/20 0636 05/06/20 1122 05/06/20 1651 05/06/20 2056 05/07/20 0651  GLUCAP 85 124* 113* 164* 96   Signed:  Domenic Polite MD.  [facility] 05/07/2020, 10:27 AM

## 2020-05-07 NOTE — Progress Notes (Addendum)
Physical Therapy Treatment Patient Details Name: Dylan [name]. MRN: [id number] DOB: 08/14/1941 Today's Date: 05/07/2020    History of Present Illness Pt is a 79 y/o male presenting to the ED following fall and inability to ambulate. PMH includes Dementia, Parkinson's disease, HTN, DM, CKD, and s/p pacemaker.     PT Comments    Pt seen for mobility progression and continues to make steady progress towards achieving his current functional mobility goals. He remains limited especially with bed mobility and transfers secondary to weakness and difficulty initiating movement. Additionally, he continues to require very close min guard with intermittent min A needed with ambulation in hallway with use of RW. He demonstrates very short, shuffling steps with very minimal correction with cueing. Additionally, he demonstrates decreased safety awareness and difficulties safely navigating around obstacles with ambulation. Pt appears to remain at an increased risk for falls and would greatly benefit from further intensive therapy services at a SNF to maximize his independence with functional mobility prior to returning home with his wife. Pt would continue to benefit from skilled physical therapy services at this time while admitted and after d/c to address the below listed limitations in order to improve overall safety and independence with functional mobility.    Follow Up Recommendations  SNF;Supervision/Assistance - 24 hour     Equipment Recommendations  None recommended by PT    Recommendations for Other Services       Precautions / Restrictions Precautions Precautions: Fall Restrictions Weight Bearing Restrictions: No    Mobility  Bed Mobility Overal bed mobility: Needs Assistance Bed Mobility: Supine to Sit     Supine to sit: HOB elevated;Mod assist     General bed mobility comments: increased time and effort needed, cueing for sequencing and technique, use of bed rails, assistance  needed for trunk elevation and to scoot hips forwards towards EOB  Transfers Overall transfer level: Needs assistance Equipment used: Rolling walker (2 wheeled) Transfers: Sit to/from Stand Sit to Stand: From elevated surface;Mod assist         General transfer comment: use of momentum, cueing for technique and safe hand placement, assistance needed to power into standing and for stability with transition  Ambulation/Gait Ambulation/Gait assistance: Min guard;Min assist Gait Distance (Feet): 150 Feet Assistive device: Rolling walker (2 wheeled) Gait Pattern/deviations: Step-to pattern;Decreased step length - right;Decreased step length - left;Decreased stride length;Shuffle;Narrow base of support Gait velocity: decreased   General Gait Details: frequent cueing needed to maintain proximity to RW and for appropriate use, min A intermittently to assist with RW management. Pt demonstrating very short, shuffling steps bilaterally with minimal correction with cueing   Stairs             Wheelchair Mobility    Modified Rankin (Stroke Patients Only)       Balance Overall balance assessment: Needs assistance Sitting-balance support: Feet supported Sitting balance-Leahy Scale: Fair     Standing balance support: Bilateral upper extremity supported;During functional activity Standing balance-Leahy Scale: Poor Standing balance comment: reliant on RW and external assist for safety                            Cognition Arousal/Alertness: Awake/alert Behavior During Therapy: Flat affect Overall Cognitive Status: History of cognitive impairments - at baseline Area of Impairment: Following commands;Safety/judgement;Memory;Attention;Awareness;Problem solving                   Current Attention Level: Sustained Memory: Decreased short-term  memory Following Commands: Follows one step commands with increased time Safety/Judgement: Decreased awareness of  deficits;Decreased awareness of safety Awareness: Intellectual Problem Solving: Slow processing;Decreased initiation;Difficulty sequencing;Requires verbal cues;Requires tactile cues        Exercises      General Comments        Pertinent Vitals/Pain Pain Assessment: Faces Faces Pain Scale: No hurt    Home Living                      Prior Function            PT Goals (current goals can now be found in the care plan section) Acute Rehab PT Goals PT Goal Formulation: With patient Time For Goal Achievement: 05/11/20 Potential to Achieve Goals: Good Progress towards PT goals: Progressing toward goals    Frequency    Min 2X/week      PT Plan Current plan remains appropriate    Co-evaluation              AM-PAC PT "6 Clicks" Mobility   Outcome Measure  Help needed turning from your back to your side while in a flat bed without using bedrails?: A Little Help needed moving from lying on your back to sitting on the side of a flat bed without using bedrails?: A Lot Help needed moving to and from a bed to a chair (including a wheelchair)?: A Little Help needed standing up from a chair using your arms (e.g., wheelchair or bedside chair)?: A Lot Help needed to walk in hospital room?: A Little Help needed climbing 3-5 steps with a railing? : A Lot 6 Click Score: 15    End of Session Equipment Utilized During Treatment: Gait belt Activity Tolerance: Patient tolerated treatment well Patient left: in chair;with call bell/phone within reach;with chair alarm set;with family/visitor present Nurse Communication: Mobility status PT Visit Diagnosis: Muscle weakness (generalized) (M62.81);Other abnormalities of gait and mobility (R26.89)     Time: 9030-0923 PT Time Calculation (min) (ACUTE ONLY): 20 min  Charges:  $Gait Training: 8-22 mins                     Anastasio Champion, DPT  Acute Rehabilitation Services Pager [phone] Office  [place] 05/07/2020, 3:14 PM

## 2020-05-08 LAB — GLUCOSE, CAPILLARY
Glucose-Capillary: 103 mg/dL — ABNORMAL HIGH (ref 70–99)
Glucose-Capillary: 147 mg/dL — ABNORMAL HIGH (ref 70–99)
Glucose-Capillary: 108 mg/dL — ABNORMAL HIGH (ref 70–99)
Glucose-Capillary: 144 mg/dL — ABNORMAL HIGH (ref 70–99)

## 2020-05-08 NOTE — Plan of Care (Signed)
  Problem: Nutrition: Goal: Adequate nutrition will be maintained Outcome: Progressing   

## 2020-05-08 NOTE — Progress Notes (Signed)
No changes, awaiting SNF for rehab, s/p peer to peer review with insurance company on Wednesday, resubmitted requests and clinicals per social work -See my discharge summary from yesterday  Domenic Polite, MD

## 2020-05-08 NOTE — TOC Progression Note (Signed)
Transition of Care (TOC) - Progression Note    Patient Details  Name: Dylan [name]. MRN: [id number] Date of Birth: [birth year]  Transition of Care [facility]) CM/SW Contact  Sharlet Salina Mila Homer, LCSW Phone Number: 05/08/2020, 2:59 PM  Clinical Narrative:   Visited room as wife requested to speak with CSW. Informed Mrs. Demetriou that we have not received a call from [facility] yet. Mrs. Beutler advised that she will be informed once the insurance company contacts CSW with SNF authorization decision. Wife expressed understanding and thanked CSW for the information.     Expected Discharge Plan: Skilled Nursing Facility Barriers to Discharge: Insurance Authorization  Expected Discharge Plan and Services Expected Discharge Plan: [place] In-house Referral: Clinical Social Work Discharge Planning Services: CM Consult Post Acute Care Choice: [place] Living arrangements for the past 2 months: Single Family Home                 DME Arranged: N/A DME Agency: NA       HH Arranged: NA HH Agency: NA         Social Determinants of Health (SDOH) Interventions    Readmission Risk Interventions No flowsheet data found.

## 2020-05-09 LAB — GLUCOSE, CAPILLARY
Glucose-Capillary: 99 mg/dL (ref 70–99)
Glucose-Capillary: 143 mg/dL — ABNORMAL HIGH (ref 70–99)
Glucose-Capillary: 112 mg/dL — ABNORMAL HIGH (ref 70–99)
Glucose-Capillary: 131 mg/dL — ABNORMAL HIGH (ref 70–99)

## 2020-05-09 NOTE — Progress Notes (Signed)
Patient seen and examined, no changes from my discharge summary 7/8 sitting up in bed eating breakfast, awake alert oriented to self and place with cognitive deficits -Stable for discharge to SNF when bed/insurance authorization available  Domenic Polite, MD

## 2020-05-10 LAB — GLUCOSE, CAPILLARY
Glucose-Capillary: 102 mg/dL — ABNORMAL HIGH (ref 70–99)
Glucose-Capillary: 134 mg/dL — ABNORMAL HIGH (ref 70–99)
Glucose-Capillary: 107 mg/dL — ABNORMAL HIGH (ref 70–99)
Glucose-Capillary: 124 mg/dL — ABNORMAL HIGH (ref 70–99)

## 2020-05-10 NOTE — Progress Notes (Signed)
No changes, awaiting insurance authorization to SNF -Remains stable, hopefully tomorrow  Domenic Polite, MD

## 2020-05-11 ENCOUNTER — Ambulatory Visit: Payer: Medicare HMO | Admitting: Neurology

## 2020-05-11 LAB — CREATININE, SERUM
Creatinine, Ser: 1.17 mg/dL (ref 0.61–1.24)
GFR calc Af Amer: >60 mL/min (ref >60)
GFR calc non Af Amer: 59 mL/min — ABNORMAL LOW (ref >60)

## 2020-05-11 LAB — GLUCOSE, CAPILLARY
Glucose-Capillary: 100 mg/dL — ABNORMAL HIGH (ref 70–99)
Glucose-Capillary: 120 mg/dL — ABNORMAL HIGH (ref 70–99)
Glucose-Capillary: 109 mg/dL — ABNORMAL HIGH (ref 70–99)

## 2020-05-11 NOTE — TOC Transition Note (Addendum)
Transition of Care (TOC) - CM/SW Discharge Note *Discharged to [facility] by non-emergency ambulance *[place]   Patient Details  Name: Dylan [name]. MRN: [id number] Date of Birth: [birth year]  Transition of Care [facility]) CM/SW Contact:  Sable Feil, LCSW Phone Number: 05/11/2020, 3:50 PM   Clinical Narrative:  Insurance authorization received and [facility] admissions director Dylan [name] contacted and informed. MD and patient's wife contacted and advised. Discharge clinicals transmitted to facility. Recent COVID test not needed per Gerald Stabs as patient has been vaccinated: 1st dose 12/04/19 and 2nd dose 12/30/19 per wife. Ambulance transport arranged.     Final next level of care: [place] ([facility]) Barriers to Discharge: Barriers Resolved (Received insurance auth 05/11/20)   Patient Goals and CMS Choice Patient states their goals for this hospitalization and ongoing recovery are:: Rehab at a skilled nursing facility CMS Medicare.gov Compare Post Acute Care list provided to:: Patient Represenative (must comment) Choice offered to / list presented to : Spouse  Discharge Placement PASRR number recieved: 04/30/20            Patient chooses bed at: [facility]  Patient to be transferred to facility by: Non-emergency ambulance transport Name of family member notified: Wife Dylan [name] by phone - [phone] Patient and family notified of of transfer: 05/11/20  Discharge Plan and Services In-house Referral: Clinical Social Work Discharge Planning Services: [employer] Consult Post Acute Care Choice: [place]          DME Arranged: N/A DME Agency: NA       HH Arranged: NA HH Agency: NA        Social Determinants of Health ([place]) Interventions  No SDOH interventions requested or needed at discharge   Readmission Risk Interventions No flowsheet data found.

## 2020-05-11 NOTE — Patient Instructions (Signed)
Visit Information  Goals Addressed      Patient Stated   .  "I need help with Mr. Barnhart...he can't move today" (pt-stated)        Wife stated    [place] (see longitudinal plan of care for additional care plan information)  Current Barriers:  [name] Knowledge Deficits related to etiology and treatment management for worsening symptoms of weakness, anuria, immobility, muscle rigidity with recent fall and ED visit  . Chronic Disease Management support and education needs related to Type II DM, non-insulin dependent, CKD, stage III, Essential hypertension, Dementia, Parkinsonism   Nurse Case Manager Clinical Goal(s):  [name] Over the next 24 hours, patient will follow up on recommendations to return to his local ED for evaluation and treatment for worsening symptoms of weakness, anuria, immobility, and muscle rigidity  CCM RN CM Interventions:  04/27/20 Inbound call completed with spouse [name] . Inter-disciplinary care team collaboration (see longitudinal plan of care) . Evaluation of current treatment plan related to gait disturbance with recent fall and ED visit and patient's adherence to plan as established by provider . Determined patient had a fall while at home alone on Sunday afternoon while his spouse ran out to pick up dinner, she is unsure what may have caused the fall . Determined spouse called EMS upon her arrival home after finding Mr. Alberico had fallen, he was evaluated and d/c with recommendations to f/u with his PCP . Determined upon arriving home from the ED, [name] had great difficulty getting Mr. Oregel out of their car and in his home due to his inability to stand and or walk . Determined today, he continues to show worsening symptoms of immobility, he cannot stand, walk, turn or shift his weight, and he has not urinated since yesterday afternoon despite taking in what [name] considers to be "enough fluids" . Assessed for s/s suggestive of Stroke, patient is  able to follow simple commands, spouse reports no evidence of facial drooping or a symmetry with limb strength . Collaborated with PCP provider Dr. [name] regarding Mr. Mcfadden condition and reported concerns from spouse . Instructed spouse [name] to call EMS to have Mr. Jourdan re-evaluated in the ED . Educated spouse on potential triggers that may worsen PD symptoms such as AKI, UTI, dehydration and or other infection, and or missing doses of Carbidopa/Levodopa  . Determined Mr. Ringenberg did miss his afternoon dose of Carbidopa/Levodopa yesterday afternoon, in addition, Mr. Wadsworth inadvertently took a double dose of his am medications Thursday morning at which time [name] called poison control and followed their advise . Instructed [name] to make the ED RN/MD aware of this mishap and to take all his medications with her to the ED, she verbalizes understanding and is agreeable  . Discussed plans with patient for ongoing care management follow up and provided patient with direct contact information for care management team 04/29/20 Inbound call completed with spouse [name] . Determined Mr. Emanuelson remains to be Inpatient and his condition is improving . Determined he will be discharged to an IP rehab facility upon discharge once ready . Discussed plans with patient for ongoing care management follow up and provided patient with direct contact information for care management team 04/30/20 Completed inbound call with spouse [name]  . Determined [name] would like a list of the top rated IP rehab facility's to help her become familiar with the facility's near by in order to chose a good place for Mr. Cwikla .  Discussed the hospital SW/CM can also provider her with a list of the local facility's with bed placement availability  . Provided [name] with a list of IP rehab facility's located in [place] including;  [facility] [place]  . Discussed plans with patient for ongoing care management follow up and provided patient with direct contact information for care management team 05/07/20 completed Inbound call with spouse [name]  . Inbound call received from spouse [name] stating she is concerned about taking Mr. Belcher home due to feeling she will physically be unable to care for him . Determined she was advised his inpatient short term rehab post d/c from [facility] was denied by Medicare due to patient is most likely not going to progress toward wellness secondary to having PD . Provided active listening to spouse and validated her feelings of concern . Discussed long term care planning with spouse for Mr. Halbert to include long term care placement into a SNF, privately paying for an certified nursing assistance part time, requesting referral for [facility] and then considering the [facility] program . Assessed for DME needs and determined [name] has prepared Mr. Golaszewski bedroom at home in order to receive a hospital bed, discussed she will request this DME upon his d/c home . Discussed having embedded BSW Daneen Schick contact her by telephone to discuss in more detail what services the PACE program have to offer  . Discussed and encouraged [name] to have the inpatient PT make recommendations for DME as appropriate such as a gait belt and or hoyer lift, Discussed having the inpatient PT demonstrate the safest way for spouse to assist Mr. Turnbo with transferring w/o causing injury to herself or the patient  . Discussed plans with patient for ongoing care management follow up and provided patient with direct contact information for care management team  Patient Self Care Activities:  . Patient verbalizes understanding of plan to work with inpatient PT for demonstration of best way to assist patient with transfers and mobility  . Attends all scheduled provider  appointments . Calls provider office for new concerns or questions . Unable to self administer medications as prescribed . Unable to perform ADLs independently . Unable to perform IADLs independently . Supportive spouse to assist with care needs   Please see past updates related to this goal by clicking on the "Past Updates" button in the selected goal         Patient verbalizes understanding of instructions provided today.   Telephone follow up appointment with care management team member scheduled for: 05/15/20  [name], RN, BSN, CCM Care Management Coordinator [facility]  Direct Phone: [phone]

## 2020-05-11 NOTE — Plan of Care (Signed)
  Problem: Clinical Measurements: Goal: Diagnostic test results will improve Outcome: Progressing   Problem: Activity: Goal: Risk for activity intolerance will decrease Outcome: Progressing   Problem: Nutrition: Goal: Adequate nutrition will be maintained Outcome: Progressing   

## 2020-05-11 NOTE — Chronic Care Management (AMB) (Signed)
Chronic Care Management   Follow Up Note   05/07/2020 Name: Dylan [name]. MRN: [id number] DOB: 22-Nov-1940  Referred by: Dylan Chard, MD Reason for referral : Chronic Care Management (FU RN CM inbound call from spouse)   Dylan [name]. is a 79 y.o. year old male who is a primary care patient of Dylan Chard, MD. The CCM team was consulted for assistance with chronic disease management and care coordination needs.    Review of patient status, including review of consultants reports, relevant laboratory and other test results, and collaboration with appropriate care team members and the patient's provider was performed as part of comprehensive patient evaluation and provision of chronic care management services.    SDOH (Social Determinants of Health) assessments performed: Yes See Care Plan activities for detailed interventions related to Dylan [name])   Inbound call completed with spouse Dylan [name] concerning Dylan [name] discharge home.     Facility-Administered Encounter Medications as of 05/06/2020  Medication   acetaminophen (TYLENOL) tablet 650 mg   Or   acetaminophen (TYLENOL) suppository 650 mg   aspirin EC tablet 81 mg   carbidopa-levodopa (SINEMET IR) 25-100 MG per tablet immediate release 1.5 tablet   diclofenac Sodium (VOLTAREN) 1 % topical gel 2 g   enoxaparin (LOVENOX) injection 40 mg   insulin aspart (novoLOG) injection 0-15 Units   insulin aspart (novoLOG) injection 0-5 Units   melatonin tablet 10 mg   memantine (NAMENDA) tablet 10 mg   ondansetron (ZOFRAN) tablet 4 mg   Or   ondansetron (ZOFRAN) injection 4 mg   rivastigmine (EXELON) 4.6 mg/24hr 4.6 mg   Outpatient Encounter Medications as of 05/06/2020  Medication Sig Note   acetaminophen (TYLENOL) 325 MG tablet Take 2 tablets (650 mg total) by mouth every 6 (six) hours as needed for mild pain (or Fever >/= 101).    allopurinol (ZYLOPRIM) 100 MG tablet Take 2 tablets (200 mg total) by  mouth daily. (Patient taking differently: Take 200 mg by mouth every morning. )    amLODipine (NORVASC) 5 MG tablet TAKE 1 TABLET BY MOUTH DAILY (Patient taking differently: Take 5 mg by mouth every morning. )    Ascorbic Acid (VITAMIN C) 1000 MG tablet Take 1,000 mg by mouth every morning.     aspirin 81 MG tablet Take 81 mg by mouth every Monday, Wednesday, and Friday.     carbidopa-levodopa (SINEMET IR) 25-100 MG tablet Take 1.5 tablets by mouth 3 (three) times daily. (Patient taking differently: Take 1.5 tablets by mouth See admin instructions. Take 1 1/2  tablet by mouth three times daily - 10am, 5pm and 10pm)    carvedilol (COREG) 25 MG tablet Take 0.5 tablets (12.5 mg total) by mouth 2 (two) times daily.    Cholecalciferol (VITAMIN D3) 5000 units CAPS Take 5,000 Units by mouth daily with supper.     Coenzyme Q10 200 MG capsule Take 200 mg by mouth daily with supper.     COLCRYS 0.6 MG tablet Take 1 tablet (0.6 mg total) by mouth daily. (Patient taking differently: Take 0.6 mg by mouth daily as needed (gout attacks). )    diclofenac Sodium (VOLTAREN) 1 % GEL Apply 2 g topically 3 (three) times daily. (Patient not taking: Reported on 04/27/2020)    glucose blood (ONETOUCH VERIO) test strip Use as instructed to check blood sugars daily dx: e11.22    Lancet Devices (ONE TOUCH DELICA LANCING DEV) MISC Please fill ONE TOUCH DELICA LANCING DEVICE.  Use to test blood  sugar twice daily as directed. E11.65    Lancets MISC Please fill basic/generic push button lancets.  Patient to test blood sugar twice daily. DX: E11.65    Melatonin 10 MG TABS Take 10 mg by mouth at bedtime.    memantine (NAMENDA) 10 MG tablet Take 1 tablet (10 mg total) by mouth 2 (two) times daily.    metFORMIN (GLUCOPHAGE) 1000 MG tablet TAKE 1 TABLET BY MOUTH EVERY DAY (Patient taking differently: Take 1,000 mg by mouth every morning. )    pravastatin (PRAVACHOL) 20 MG tablet TAKE 1 TABLET BY ORAL ROUTE EVERY DAY  (Patient taking differently: Take 20 mg by mouth every morning. )    rivastigmine (EXELON) 4.6 mg/24hr Place 1 patch (4.6 mg total) onto the skin daily. (Patient taking differently: Place 4.6 mg onto the skin every evening. ) 04/27/2020: Current patch is on back of right shoulder   TRULICITY 1.5 AQ/7.6AU SOPN INJECT 1.5 MG INTO THE SKIN ONCE A WEEK. (Patient taking differently: Inject 1.5 mg into the skin every Monday. )    valsartan (DIOVAN) 320 MG tablet TAKE 1 TABLET BY MOUTH EVERY DAY (Patient taking differently: Take 320 mg by mouth every morning. )    [DISCONTINUED] carvedilol (COREG) 25 MG tablet Take 1 tablet (25 mg total) by mouth 2 (two) times daily.      Objective:  Lab Results  Component Value Date   HGBA1C 7.5 (H) 03/24/2020   HGBA1C 8.1 (H) 12/23/2019   HGBA1C 10.5 (H) 09/19/2019   Lab Results  Component Value Date   MICROALBUR 80 01/22/2019   LDLCALC 66 07/24/2019   CREATININE 1.17 05/11/2020   BP Readings from Last 3 Encounters:  05/11/20 135/66  04/26/20 111/61  04/21/20 126/80    Goals Addressed      Patient Stated     "I need help with Dylan [name]...he can't move today" (pt-stated)        Wife stated    Dylan [name] (see longitudinal plan of care for additional care plan information)  Current Barriers:   Knowledge Deficits related to etiology and treatment management for worsening symptoms of weakness, anuria, immobility, muscle rigidity with recent fall and ED visit   Chronic Disease Management support and education needs related to Type II DM, non-insulin dependent, CKD, stage III, Essential hypertension, Dementia, Parkinsonism   Nurse Case Manager Clinical Goal(s):   Over the next 24 hours, patient will follow up on recommendations to return to his local ED for evaluation and treatment for worsening symptoms of weakness, anuria, immobility, and muscle rigidity  CCM RN CM Interventions:  04/27/20 Inbound call completed with spouse  Dylan [name] care team collaboration (see longitudinal plan of care)  Evaluation of current treatment plan related to gait disturbance with recent fall and ED visit and patient's adherence to plan as established by provider  Determined patient had a fall while at home alone on Sunday afternoon while his spouse ran out to pick up dinner, she is unsure what may have caused the fall  Determined spouse called EMS upon her arrival home after finding Mr. Novick had fallen, he was evaluated and d/c with recommendations to f/u with his PCP  Determined upon arriving home from the ED, [name] had great difficulty getting Mr. Streett out of their car and in his home due to his inability to stand and or walk  Determined today, he continues to show worsening symptoms of immobility, he cannot stand, walk, turn or shift his weight,  and he has not urinated since yesterday afternoon despite taking in what [name] considers to be "enough fluids"  Assessed for s/s suggestive of Stroke, patient is able to follow simple commands, spouse reports no evidence of facial drooping or a symmetry with limb strength  Collaborated with PCP provider Dr. Glendale [name] regarding Mr. Duquette condition and reported concerns from spouse  Instructed spouse [name] to call EMS to have Mr. River re-evaluated in the ED  Educated spouse on potential triggers that may worsen PD symptoms such as AKI, UTI, dehydration and or other infection, and or missing doses of Carbidopa/Levodopa   Determined Mr. Buenger did miss his afternoon dose of Carbidopa/Levodopa yesterday afternoon, in addition, Mr. Shutes inadvertently took a double dose of his am medications Thursday morning at which time [name] called poison control and followed their advise  Instructed [name] to make the ED RN/MD aware of this mishap and to take all his medications with her to the ED, she verbalizes understanding and is agreeable    Discussed plans with patient for ongoing care management follow up and provided patient with direct contact information for care management team 04/29/20 Inbound call completed with spouse [name]  Determined Mr. Sullenberger remains to be Inpatient and his condition is improving  Determined he will be discharged to an IP rehab facility upon discharge once ready  Discussed plans with patient for ongoing care management follow up and provided patient with direct contact information for care management team 04/30/20 Completed inbound call with spouse [name]   Determined [name] would like a list of the top rated IP rehab facility's to help her become familiar with the facility's near by in order to chose a good place for Mr. Buczynski  Discussed the hospital SW/CM can also provider her with a list of the local facility's with bed placement availability   Provided [name] with a list of IP rehab facility's located in [place] including;  o [place]   Discussed plans with patient for ongoing care management follow up and provided patient with direct contact information for care management team 05/07/20 completed Inbound call with spouse [name]   Inbound call received from spouse [name] stating she is concerned about taking Mr. Geibel home due to feeling she will physically be unable to care for him  Determined she was advised his inpatient short term rehab post d/c from [facility] was denied by Medicare due to patient is most likely not going to progress toward wellness secondary to having PD  Provided active listening to spouse and validated her feelings of concern  Discussed long term care planning with spouse for Mr. Inclan to include long term care placement into a SNF, privately paying for an certified nursing assistance part time, requesting referral for [facility] and then considering the [facility]  program  Assessed for DME needs and determined [name] has prepared Mr. Mcbane bedroom at home in order to receive a hospital bed, discussed she will request this DME upon his d/c home  Discussed having embedded BSW Daneen Schick contact her by telephone to discuss in more detail what services the PACE program have to offer   Discussed and encouraged [name] to have the inpatient PT make recommendations for DME as appropriate such as a gait belt and or hoyer lift, Discussed having the inpatient PT demonstrate the safest way for spouse to assist Mr. Nair with transferring w/o causing injury  to herself or the patient   Discussed plans with patient for ongoing care management follow up and provided patient with direct contact information for care management team  Patient Self Care Activities:   Patient verbalizes understanding of plan to work with inpatient PT for demonstration of best way to assist patient with transfers and mobility   Attends all scheduled provider appointments  Calls provider office for new concerns or questions  Unable to self administer medications as prescribed  Unable to perform ADLs independently  Unable to perform IADLs independently  Supportive spouse to assist with care needs   Please see past updates related to this goal by clicking on the "Past Updates" button in the selected goal        Plan:   Telephone follow up appointment with care management team member scheduled for: 05/15/20  [name], RN, BSN, CCM Care Management Coordinator [place] [facility]  Direct Phone: [phone]

## 2020-05-11 NOTE — Discharge Summary (Signed)
Physician Discharge Summary  Dylan [name]. UDJ:[id number] DOB: 05/08/1941 DOA: 04/27/2020  PCP: [name], MD  Admit date: 04/27/2020 Discharge date: 05/11/2020  Time spent: 35 minutes  Recommendations for Outpatient Follow-up:  1. PCP in 1 week, please consider ongoing goals of care discussions 2. Palliative evaluation as outpatient   Discharge Diagnoses:  Principal Problem:   Syncope Orthostatic hypotension Acute kidney injury Dementia Parkinson's disease   Essential hypertension   Type 2 diabetes mellitus with stage 3a chronic kidney disease, without long-term current use of insulin (HCC)   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HCC)   AKI (acute kidney injury) ([place])   Elevated CK   Gout   Hyperlipemia   Discharge Condition: Stable  Diet recommendation: Low-sodium, diabetic  Filed Weights   05/10/20 0127 05/10/20 2111 05/11/20 0428  Weight: 81.4 kg 81.5 kg 81.5 kg    History of present illness:  Dylan [name]. is an 79 y.o. male with medical history significant ofcomplete heart block status post pacemaker placement, aortic stenosis, Parkinson disease, hypertension, hyperlipidemia, diabetes mellitus, gout, dementia, CKD stage IIIb brought by EMS. Patient is poor historian.  She reported that patient fell on the ground on Saturday for couple of hours while she was not at home in 1. She came and found him on the floor. He was unable to get up and move  Hospital Course:   Syncope: -Secondary to orthostatic  hypotension and dehydration -Patient is afebrile, no leukocytosis. UA, UDS: Negative for infection -CT head, chest x-ray, abdominal x-ray: Negative for acute findings. -Completed pacemaker interrogation in the emergency room which was unremarkable -PT OT eval completed due to overall debility, gait dysfunction and severe deconditioning, SNF was recommended for short-term rehab  AKI on CKD stage IIIalikely prerenal in the setting of dehydration with poor oral  intake: -Presented with creatinine: 3.47, GFR: 18 -Resolved with hydration, ARB and Lasix discontinued -Monitor volume status closely and determine regarding resumption of diuretics in the future at a lower dose  Essential hypertension -Discontinued ARB, amlodipine and diuretics due to orthostatic hypotension primarily secondary to Parkinson's disease, dopaminergic meds etc  Elevated CK: -Likely secondary to fall -Trending down  Parkinson:  -Continue Sinemet -Makes him higher fall risk  Dementia:  -Continue Namenda and rivastigmine  Complete heart block status post pacemaker placement: -Device interrogation was negative in ED  Hyperlipidemia: Continue statin  Type 2 diabetes mellitus:  -Hemoglobin A1c 7.5 on 03/24/2020. -Resume oral hypoglycemics at discharge  Physical deconditioning: -Plan for rehabilitation  Ethics: This is a chronically ill elderly male with dementia, Parkinson's disease, malnutrition and multiple medical problems, needs further goals of care discussions with his wife -Recommend palliative care evaluation as outpatient  Discharge Exam: Vitals:   05/10/20 2114 05/11/20 0505  BP: (!) 143/68 126/79  Pulse: 60 63  Resp: 16 15  Temp: 98.2 F (36.8 C) (!) 97.1 F (36.2 C)  SpO2: 98% 100%    General: Awake alert, oriented to self and only partly to place, cognitive deficits noted Cardiovascular: S1-S2, regular rate rhythm Respiratory: Clear  Discharge Instructions    Allergies as of 05/11/2020   No Known Allergies     Medication List    STOP taking these medications   amLODipine 5 MG tablet Commonly known as: NORVASC   valsartan 320 MG tablet Commonly known as: DIOVAN     TAKE these medications   acetaminophen 325 MG tablet Commonly known as: TYLENOL Take 2 tablets (650 mg total) by mouth every 6 (six) hours as needed  for mild pain (or Fever >/= 101).   allopurinol 100 MG tablet Commonly known as: ZYLOPRIM Take 2  tablets (200 mg total) by mouth daily. What changed: when to take this   aspirin 81 MG tablet Take 81 mg by mouth every Monday, Wednesday, and Friday.   carbidopa-levodopa 25-100 MG tablet Commonly known as: SINEMET IR Take 1.5 tablets by mouth 3 (three) times daily. What changed:   when to take this  additional instructions   carvedilol 25 MG tablet Commonly known as: COREG Take 0.5 tablets (12.5 mg total) by mouth 2 (two) times daily. What changed: how much to take   Coenzyme Q10 200 MG capsule Take 200 mg by mouth daily with supper.   Colcrys 0.6 MG tablet Generic drug: colchicine Take 1 tablet (0.6 mg total) by mouth daily. What changed:   when to take this  reasons to take this   diclofenac Sodium 1 % Gel Commonly known as: Voltaren Apply 2 g topically 3 (three) times daily.   Lancets Misc Please fill basic/generic push button lancets.  Patient to test blood sugar twice daily. DX: E11.65   Melatonin 10 MG Tabs Take 10 mg by mouth at bedtime.   memantine 10 MG tablet Commonly known as: NAMENDA Take 1 tablet (10 mg total) by mouth 2 (two) times daily.   metFORMIN 1000 MG tablet Commonly known as: GLUCOPHAGE TAKE 1 TABLET BY MOUTH EVERY DAY What changed: when to take this   Dylan [name] Misc Please fill ONE TOUCH DELICA LANCING DEVICE.  Use to test blood sugar twice daily as directed. E11.65   OneTouch Verio test strip Generic drug: glucose blood Use as instructed to check blood sugars daily dx: e11.22   pravastatin 20 MG tablet Commonly known as: PRAVACHOL TAKE 1 TABLET BY ORAL ROUTE EVERY DAY What changed: See the new instructions.   rivastigmine 4.6 mg/24hr Commonly known as: Exelon Place 1 patch (4.6 mg total) onto the skin daily. What changed: when to take this   Trulicity 1.5 KT/6.2BW Sopn Generic drug: Dulaglutide INJECT 1.5 MG INTO THE SKIN ONCE A WEEK. What changed: See the new instructions.   vitamin C 1000 MG  tablet Take 1,000 mg by mouth every morning.   Vitamin D3 125 MCG (5000 UT) Caps Take 5,000 Units by mouth daily with supper.      No Known Allergies  Contact information for follow-up providers    [name], MD Follow up in 1 week(s).   Specialty: Internal Medicine Contact information: [address] [place] [address] [phone]        Adrian Prows, MD .   Specialty: Cardiology Contact information: [place] [address] [phone]            Contact information for after-discharge care    Destination    [facility] Preferred SNF .   Service: Skilled Nursing Contact information: [address] [place] [phone]                   The results of significant diagnostics from this hospitalization (including imaging, microbiology, ancillary and laboratory) are listed below for reference.    Significant Diagnostic Studies: DG Ribs Bilateral W/Chest  Result Date: 04/27/2020 CLINICAL DATA:  Rib pain after fall EXAM: BILATERAL RIBS AND CHEST - 4+ VIEW COMPARISON:  04/26/2020 FINDINGS: Single-view chest demonstrates stable left-sided pacing device. Stable cardiomediastinal silhouette with aortic atherosclerosis. No focal  opacity, pleural effusion or pneumothorax. Bilateral rib series demonstrates no acute displaced rib fracture. IMPRESSION: Negative. Electronically Signed   By: Donavan Foil M.D.   On: 04/27/2020 17:02   CT HEAD WO CONTRAST  Result Date: 04/27/2020 CLINICAL DATA:  Head trauma EXAM: CT HEAD WITHOUT CONTRAST TECHNIQUE: Contiguous axial images were obtained from the base of the skull through the vertex without intravenous contrast. COMPARISON:  06/07/2019 FINDINGS: Brain: Mild age related volume loss. No acute intracranial abnormality. Specifically, no hemorrhage, hydrocephalus, mass lesion, acute infarction, or significant intracranial injury. Vascular: No  hyperdense vessel or unexpected calcification. Skull: No acute calvarial abnormality. Sinuses/Orbits: Visualized paranasal sinuses and mastoids clear. Orbital soft tissues unremarkable. Other: None IMPRESSION: No acute intracranial abnormality. Electronically Signed   By: Rolm Baptise M.D.   On: 04/27/2020 17:12   DG Chest Port 1 View  Result Date: 04/26/2020 CLINICAL DATA:  Unwitnessed fall, found down. EXAM: PORTABLE CHEST 1 VIEW COMPARISON:  Chest and rib radiographs 10/20/2019 FINDINGS: Left-sided pacemaker in place. Upper normal heart size with unchanged mediastinal contours. Aortic atherosclerosis. The lungs are clear. Pulmonary vasculature is normal. No consolidation, pleural effusion, or pneumothorax. No acute osseous abnormalities are seen. IMPRESSION: No acute chest findings. Electronically Signed   By: Keith Rake M.D.   On: 04/26/2020 20:31   DG Abd 2 Views  Result Date: 04/27/2020 CLINICAL DATA:  Fall with rib pain EXAM: ABDOMEN - 2 VIEW COMPARISON:  None. FINDINGS: Mild diffuse gaseous prominence of the bowel without obstructive pattern. Moderate stool in the right colon. There is no evidence of free air. No radio-opaque calculi or other significant radiographic abnormality is seen. IMPRESSION: Mild diffuse gaseous prominence of the bowel without obstructive pattern. Electronically Signed   By: Donavan Foil M.D.   On: 04/27/2020 17:05   ECHOCARDIOGRAM COMPLETE  Result Date: 04/28/2020    ECHOCARDIOGRAM REPORT   Patient Name:   Zaion Hreha. Date of Exam: 04/28/2020 Medical Rec #:  [id number]          Height:       71.0 in Accession #:    [id number]         Weight:       183.9 lb Date of Birth:  06-07-1941          BSA:          2.035 m Patient Age:    67 years           BP:           128/71 mmHg Patient Gender: M                  HR:           73 bpm. Exam Location:  Inpatient Procedure: 2D Echo, Cardiac Doppler and Color Doppler Indications:    Syncope  History:        Patient has  prior history of Echocardiogram examinations, most                 recent 06/14/2018. Pacemaker, Aortic Valve Disease,                 Arrythmias:CHB; Risk Factors:Hypertension, Dyslipidemia and                 Diabetes. CKD.  Sonographer:    Dustin Flock Referring Phys: [id number] Gregg  1. Left ventricular ejection fraction, by estimation, is 50 to 55%. The left ventricle has low normal function. The left ventricle  has no regional wall motion abnormalities. Left ventricular diastolic parameters are consistent with Grade I diastolic dysfunction (impaired relaxation).  2. Right ventricular systolic function is normal. The right ventricular size is normal. There is moderately elevated pulmonary artery systolic pressure.  3. The mitral valve is normal in structure. Mild mitral valve regurgitation.  4. The aortic valve is tricuspid. Aortic valve regurgitation is mild. Mild to moderate aortic valve sclerosis/calcification is present, without any evidence of aortic stenosis. FINDINGS  Left Ventricle: Left ventricular ejection fraction, by estimation, is 50 to 55%. The left ventricle has low normal function. The left ventricle has no regional wall motion abnormalities. The left ventricular internal cavity size was small. There is no left ventricular hypertrophy. Abnormal (paradoxical) septal motion, consistent with RV pacemaker. Left ventricular diastolic parameters are consistent with Grade I diastolic dysfunction (impaired relaxation). Normal left ventricular filling pressure. Right Ventricle: The right ventricular size is normal. No increase in right ventricular wall thickness. Right ventricular systolic function is normal. There is moderately elevated pulmonary artery systolic pressure. The tricuspid regurgitant velocity is 3.42 m/s, and with an assumed right atrial pressure of 3 mmHg, the estimated right ventricular systolic pressure is 95.6 mmHg. Left Atrium: Left atrial size was normal in  size. Right Atrium: Right atrial size was normal in size. Pericardium: There is no evidence of pericardial effusion. Mitral Valve: The mitral valve is normal in structure. Mild mitral valve regurgitation. Tricuspid Valve: The tricuspid valve is normal in structure. Tricuspid valve regurgitation is mild. Aortic Valve: The aortic valve is tricuspid. Aortic valve regurgitation is mild. Aortic regurgitation PHT measures 683 msec. Mild to moderate aortic valve sclerosis/calcification is present, without any evidence of aortic stenosis. Aortic valve mean gradient measures 9.4 mmHg. Aortic valve peak gradient measures 17.6 mmHg. Aortic valve area, by VTI measures 2.08 cm. Pulmonic Valve: The pulmonic valve was not well visualized. Pulmonic valve regurgitation is not visualized. Aorta: The aortic root is normal in size and structure. IAS/Shunts: No atrial level shunt detected by color flow Doppler. Additional Comments: A pacer wire is visualized.  LEFT VENTRICLE PLAX 2D LVIDd:         4.90 cm  Diastology LVIDs:         3.30 cm  LV e' lateral:   7.18 cm/s LV PW:         1.00 cm  LV E/e' lateral: 8.2 LV IVS:        1.00 cm  LV e' medial:    5.44 cm/s LVOT diam:     2.20 cm  LV E/e' medial:  10.9 LV SV:         94 LV SV Index:   46 LVOT Area:     3.80 cm  RIGHT VENTRICLE RV Basal diam:  2.90 cm RV S prime:     10.70 cm/s TAPSE (M-mode): 2.6 cm LEFT ATRIUM              Index       RIGHT ATRIUM           Index LA diam:        3.30 cm  1.62 cm/m  RA Area:     18.90 cm LA Vol (A2C):   60.6 ml  29.79 ml/m RA Volume:   53.90 ml  26.49 ml/m LA Vol (A4C):   113.0 ml 55.54 ml/m LA Biplane Vol: 82.9 ml  40.75 ml/m  AORTIC VALVE AV Area (Vmax):    1.87 cm AV Area (Vmean):  1.91 cm AV Area (VTI):     2.08 cm AV Vmax:           209.64 cm/s AV Vmean:          144.890 cm/s AV VTI:            0.449 m AV Peak Grad:      17.6 mmHg AV Mean Grad:      9.4 mmHg LVOT Vmax:         103.00 cm/s LVOT Vmean:        72.900 cm/s LVOT VTI:           0.246 m LVOT/AV VTI ratio: 0.55 AI PHT:            683 msec  AORTA Ao Root diam: 2.80 cm MITRAL VALVE                TRICUSPID VALVE MV Area (PHT): 5.66 cm     TR Peak grad:   46.8 mmHg MV Decel Time: 134 msec     TR Vmax:        342.00 cm/s MV E velocity: 59.10 cm/s MV A velocity: 116.00 cm/s  SHUNTS MV E/A ratio:  0.51         Systemic VTI:  0.25 m                             Systemic Diam: 2.20 cm Dani Gobble Croitoru MD Electronically signed by Sanda Klein MD Signature Date/Time: 04/28/2020/11:55:34 AM    Final    CT Renal Stone Study  Result Date: 04/27/2020 CLINICAL DATA:  Acute renal failure.  Fall yesterday. EXAM: CT ABDOMEN AND PELVIS WITHOUT CONTRAST TECHNIQUE: Multidetector CT imaging of the abdomen and pelvis was performed following the standard protocol without IV contrast. COMPARISON:  Radiograph earlier today. FINDINGS: Lower chest: No basilar pneumothorax or focal airspace disease. Pacemaker partially included. Coronary artery calcifications. No pleural fluid. Remote posterior left eighth rib fracture. No evidence of acute fracture of lower ribs. Hepatobiliary: Few coarse calcifications in the central dome of the liver. No evidence of focal lesion on noncontrast exam. No evidence of perihepatic hematoma. Gallbladder physiologically distended, no calcified stone. No biliary dilatation. Pancreas: No ductal dilatation or inflammation. Spleen: Normal in size without focal abnormality. Adrenals/Urinary Tract: Mild left greater than right adrenal thickening without dominant nodule. There is no hydronephrosis. Minimal symmetric perinephric edema. No renal calculi. No obvious focal renal mass on noncontrast exam. Both ureters are decompressed. Urinary bladder is partially distended. No bladder wall thickening. Stomach/Bowel: Small hiatal hernia. Stomach is decompressed. Normal positioning of the duodenum and ligament of Treitz. There is no small bowel obstruction or inflammation. Normal appendix. Large  volume of colonic stool, greater in the right colon. Significant tortuosity of the transverse and sigmoid colon. Mild gaseous distension of tortuous sigmoid colon without wall thickening or inflammation. No significant diverticular disease. Vascular/Lymphatic: Aortic atherosclerosis. Moderate branch atherosclerosis. There is no portal venous or mesenteric gas. No bulky abdominopelvic adenopathy. Reproductive: Enlarged prostate gland spanning 5 cm transverse. Other: No free air, free fluid, or intra-abdominal fluid collection. Musculoskeletal: Remote L4 compression fracture with vertebroplasty. Remote minor L3 superior endplate compression fracture. Minimally displaced fracture through the anterior inferior aspect of L1 vertebral body appears acute. Trace loss of height inferiorly. There is no posterior element involvement. There is trabecular coarsening involving the right iliac bone suggestive of underlying Paget's. Degenerative change of both hips. IMPRESSION: 1. No  explanation for acute renal failure. 2. Minimally displaced fracture through the anterior inferior aspect of L1 vertebral body appears acute. Trace loss of height inferiorly. No posterior element involvement. Recommend correlation for back pain/focal tenderness given recent fall. 3. Remote L4 and L3 compression fractures. 4. Large volume of colonic stool, greater in the right colon, can be seen with constipation. Diffuse colonic tortuosity. 5. Enlarged prostate gland. Aortic Atherosclerosis (ICD10-I70.0). Electronically Signed   By: Keith Rake M.D.   On: 04/27/2020 19:26    Microbiology: Recent Results (from the past 240 hour(s))  SARS CORONAVIRUS 2 (TAT 6-24 HRS) Nasopharyngeal Nasopharyngeal Swab     Status: None   Collection Time: 05/06/20  6:54 PM   Specimen: Nasopharyngeal Swab  Result Value Ref Range Status   SARS Coronavirus 2 NEGATIVE NEGATIVE Final    Comment: (NOTE) SARS-CoV-2 target nucleic acids are NOT DETECTED.  The  SARS-CoV-2 RNA is generally detectable in upper and lower respiratory specimens during the acute phase of infection. Negative results do not preclude SARS-CoV-2 infection, do not rule out co-infections with other pathogens, and should not be used as the sole basis for treatment or other patient management decisions. Negative results must be combined with clinical observations, patient history, and epidemiological information. The expected result is Negative.  Fact Sheet for Patients: SugarRoll.be  Fact Sheet for Healthcare Providers: https://www.woods-mathews.com/  This test is not yet approved or cleared by the [place] FDA and  has been authorized for detection and/or diagnosis of SARS-CoV-2 by FDA under an Emergency Use Authorization (EUA). This EUA will remain  in effect (meaning this test can be used) for the duration of the COVID-19 declaration under Se ction 564(b)(1) of the Act, 21 U.S.C. section 360bbb-3(b)(1), unless the authorization is terminated or revoked sooner.  Performed at [facility], [place] [address], [place], [place] [address]      Labs: Basic Metabolic Panel: Recent Labs  Lab 05/06/20 0809 05/11/20 0430  NA 135  --   K 3.5  --   CL 101  --   CO2 24  --   GLUCOSE 213*  --   BUN 16  --   CREATININE 1.12 1.17  CALCIUM 9.1  --    Liver Function Tests: No results for input(s): AST, ALT, ALKPHOS, BILITOT, PROT, ALBUMIN in the last 168 hours. No results for input(s): LIPASE, AMYLASE in the last 168 hours. No results for input(s): AMMONIA in the last 168 hours. CBC: Recent Labs  Lab 05/06/20 0809  WBC 4.3  HGB 11.7*  HCT 36.1*  MCV 88.3  PLT 203   Cardiac Enzymes: No results for input(s): CKTOTAL, CKMB, CKMBINDEX, TROPONINI in the last 168 hours. BNP: BNP (last 3 results) No results for input(s): BNP in the last 8760 hours.  ProBNP (last 3 results) No results for input(s): PROBNP in the  last 8760 hours.  CBG: Recent Labs  Lab 05/10/20 0703 05/10/20 1109 05/10/20 1634 05/10/20 2215 05/11/20 0650  GLUCAP 102* 134* 107* 124* 100*   Signed:  Domenic Polite MD.  [facility] 05/11/2020, 9:50 AM

## 2020-05-11 NOTE — Progress Notes (Signed)
DISCHARGE NOTE SNF [name] to be discharged Skilled nursing facility per MD order. Patient verbalized understanding.  Skin clean, dry and intact without evidence of skin break down, no evidence of skin tears noted. IV catheter discontinued intact. Site without signs and symptoms of complications. Dressing and pressure applied. Pt denies pain at the site currently. No complaints noted.  Patient free of lines, drains, and wounds.   Discharge packet assembled. An After Visit Summary (AVS) was printed and given to the EMS personnel. Patient escorted via stretcher and discharged to [facility] via ambulance. Report called to accepting facility; all questions and concerns addressed.   Aneta Mins BSN, RN3

## 2020-05-12 ENCOUNTER — Telehealth: Payer: Self-pay

## 2020-05-12 NOTE — Telephone Encounter (Signed)
Call to do TCM call. Pt was discharged from hospital to rehab facility. Pt is at [facility] in [place]. Information from wife.

## 2020-05-15 ENCOUNTER — Ambulatory Visit: Payer: Self-pay

## 2020-05-15 ENCOUNTER — Telehealth: Payer: Medicare HMO

## 2020-05-15 DIAGNOSIS — N1831 Chronic kidney disease, stage 3a: Secondary | ICD-10-CM

## 2020-05-15 DIAGNOSIS — N183 Chronic kidney disease, stage 3 unspecified: Secondary | ICD-10-CM

## 2020-05-15 NOTE — Chronic Care Management (AMB) (Signed)
Chronic Care Management    Social Work Follow Up Note  05/15/2020 Name: Dylan [name]. MRN: [id number] DOB: April 24, 1941  Dylan [name]. is a 79 y.o. year old male who is a primary care patient of [name], MD. The CCM team was consulted for assistance with care coordination.   Review of patient status, including review of consultants reports, other relevant assessments, and collaboration with appropriate care team members and the patient's provider was performed as part of comprehensive patient evaluation and provision of chronic care management services.    SW reviewed patient chart in preparation to follow up on respite resource needs. SW noted patient is currently at [facility] in [place] for rehab.     Outpatient Encounter Medications as of 05/15/2020  Medication Sig Note  . acetaminophen (TYLENOL) 325 MG tablet Take 2 tablets (650 mg total) by mouth every 6 (six) hours as needed for mild pain (or Fever >/= 101).   [name] allopurinol (ZYLOPRIM) 100 MG tablet Take 2 tablets (200 mg total) by mouth daily. (Patient taking differently: Take 200 mg by mouth every morning. )   . Ascorbic Acid (VITAMIN C) 1000 MG tablet Take 1,000 mg by mouth every morning.    [name] aspirin 81 MG tablet Take 81 mg by mouth every Monday, Wednesday, and Friday.    . carbidopa-levodopa (SINEMET IR) 25-100 MG tablet Take 1.5 tablets by mouth 3 (three) times daily. (Patient taking differently: Take 1.5 tablets by mouth See admin instructions. Take 1 1/2  tablet by mouth three times daily - 10am, 5pm and 10pm)   . carvedilol (COREG) 25 MG tablet Take 0.5 tablets (12.5 mg total) by mouth 2 (two) times daily.   . Cholecalciferol (VITAMIN D3) 5000 units CAPS Take 5,000 Units by mouth daily with supper.    . Coenzyme Q10 200 MG capsule Take 200 mg by mouth daily with supper.    . COLCRYS 0.6 MG tablet Take 1 tablet (0.6 mg total) by mouth daily. (Patient taking differently: Take 0.6 mg by mouth daily as needed  (gout attacks). )   . diclofenac Sodium (VOLTAREN) 1 % GEL Apply 2 g topically 3 (three) times daily. (Patient not taking: Reported on 04/27/2020)   . 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 Lancet Devices (ONE TOUCH DELICA LANCING DEV) MISC Please fill ONE TOUCH DELICA LANCING DEVICE.  Use to test blood sugar twice daily as directed. E11.65   . Lancets MISC Please fill basic/generic push button lancets.  Patient to test blood sugar twice daily. DX: E11.65   . Melatonin 10 MG TABS Take 10 mg by mouth at bedtime.   . memantine (NAMENDA) 10 MG tablet Take 1 tablet (10 mg total) by mouth 2 (two) times daily.   . metFORMIN (GLUCOPHAGE) 1000 MG tablet TAKE 1 TABLET BY MOUTH EVERY DAY (Patient taking differently: Take 1,000 mg by mouth every morning. )   . pravastatin (PRAVACHOL) 20 MG tablet TAKE 1 TABLET BY ORAL ROUTE EVERY DAY (Patient taking differently: Take 20 mg by mouth every morning. )   . rivastigmine (EXELON) 4.6 mg/24hr Place 1 patch (4.6 mg total) onto the skin daily. (Patient taking differently: Place 4.6 mg onto the skin every evening. ) 04/27/2020: Current patch is on back of right shoulder  . TRULICITY 1.5 UX/3.2TF SOPN INJECT 1.5 MG INTO THE SKIN ONCE A WEEK. (Patient taking differently: Inject 1.5 mg into the skin every Monday. )  No facility-administered encounter medications on file as of 05/15/2020.     Follow Up Plan: SW will follow up with patient by phone over the next three weeks.  Daneen Schick, BSW, CDP Social Worker, Certified Dementia Practitioner [place] [facility] [place] [facility] [phone]

## 2020-05-18 ENCOUNTER — Telehealth: Payer: Medicare HMO

## 2020-05-19 ENCOUNTER — Ambulatory Visit: Payer: Medicare HMO

## 2020-05-19 ENCOUNTER — Ambulatory Visit: Payer: Medicare HMO | Admitting: Podiatry

## 2020-05-21 ENCOUNTER — Other Ambulatory Visit: Payer: Self-pay

## 2020-05-21 ENCOUNTER — Ambulatory Visit: Payer: Self-pay

## 2020-05-21 ENCOUNTER — Telehealth: Payer: Medicare HMO

## 2020-05-21 DIAGNOSIS — E1121 Type 2 diabetes mellitus with diabetic nephropathy: Secondary | ICD-10-CM | POA: Diagnosis not present

## 2020-05-21 DIAGNOSIS — N1831 Chronic kidney disease, stage 3a: Secondary | ICD-10-CM

## 2020-05-21 DIAGNOSIS — F0391 Unspecified dementia with behavioral disturbance: Secondary | ICD-10-CM

## 2020-05-21 DIAGNOSIS — N183 Chronic kidney disease, stage 3 unspecified: Secondary | ICD-10-CM

## 2020-05-21 DIAGNOSIS — I10 Essential (primary) hypertension: Secondary | ICD-10-CM

## 2020-05-21 DIAGNOSIS — G20 Parkinson's disease: Secondary | ICD-10-CM

## 2020-05-22 NOTE — Chronic Care Management (AMB) (Signed)
Chronic Care Management   Follow Up Note   05/21/2020 Name: Kian Ottaviano. MRN: [id number] DOB: [birth year]  Referred by: [name], MD Reason for referral : Chronic Care Management (FU RN CM Call )   [name] is a 79 y.o. year old male who is a primary care patient of [name], MD. The CCM team was consulted for assistance with chronic disease management and care coordination needs.    Review of patient status, including review of consultants reports, relevant laboratory and other test results, and collaboration with appropriate care team members and the patient's provider was performed as part of comprehensive patient evaluation and provision of chronic care management services.    SDOH (Social Determinants of Health) assessments performed: No See Care Plan activities for detailed interventions related to [place])   Inbound call received from spouse [name] regarding patient's upcoming discharge from rehab.     Outpatient Encounter Medications as of 05/21/2020  Medication Sig Note  . acetaminophen (TYLENOL) 325 MG tablet Take 2 tablets (650 mg total) by mouth every 6 (six) hours as needed for mild pain (or Fever >/= 101).   [name] allopurinol (ZYLOPRIM) 100 MG tablet Take 2 tablets (200 mg total) by mouth daily. (Patient taking differently: Take 200 mg by mouth every morning. )   . Ascorbic Acid (VITAMIN C) 1000 MG tablet Take 1,000 mg by mouth every morning.    [name] aspirin 81 MG tablet Take 81 mg by mouth every Monday, Wednesday, and Friday.    . carbidopa-levodopa (SINEMET IR) 25-100 MG tablet Take 1.5 tablets by mouth 3 (three) times daily. (Patient taking differently: Take 1.5 tablets by mouth See admin instructions. Take 1 1/2  tablet by mouth three times daily - 10am, 5pm and 10pm)   . carvedilol (COREG) 25 MG tablet Take 0.5 tablets (12.5 mg total) by mouth 2 (two) times daily.   . Cholecalciferol (VITAMIN D3) 5000 units CAPS Take 5,000 Units by mouth daily  with supper.    . Coenzyme Q10 200 MG capsule Take 200 mg by mouth daily with supper.    . COLCRYS 0.6 MG tablet Take 1 tablet (0.6 mg total) by mouth daily. (Patient taking differently: Take 0.6 mg by mouth daily as needed (gout attacks). )   . diclofenac Sodium (VOLTAREN) 1 % GEL Apply 2 g topically 3 (three) times daily. (Patient not taking: Reported on 04/27/2020)   . 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 Lancet Devices (ONE TOUCH DELICA LANCING DEV) MISC Please fill ONE TOUCH DELICA LANCING DEVICE.  Use to test blood sugar twice daily as directed. E11.65   . Lancets MISC Please fill basic/generic push button lancets.  Patient to test blood sugar twice daily. DX: E11.65   . Melatonin 10 MG TABS Take 10 mg by mouth at bedtime.   . memantine (NAMENDA) 10 MG tablet Take 1 tablet (10 mg total) by mouth 2 (two) times daily.   . metFORMIN (GLUCOPHAGE) 1000 MG tablet TAKE 1 TABLET BY MOUTH EVERY DAY (Patient taking differently: Take 1,000 mg by mouth every morning. )   . pravastatin (PRAVACHOL) 20 MG tablet TAKE 1 TABLET BY ORAL ROUTE EVERY DAY (Patient taking differently: Take 20 mg by mouth every morning. )   . rivastigmine (EXELON) 4.6 mg/24hr Place 1 patch (4.6 mg total) onto the skin daily. (Patient taking differently: Place 4.6 mg onto the skin every evening. ) 04/27/2020: Current  patch is on back of right shoulder  . TRULICITY 1.5 KD/3.2IZ SOPN INJECT 1.5 MG INTO THE SKIN ONCE A WEEK. (Patient taking differently: Inject 1.5 mg into the skin every Monday. )    No facility-administered encounter medications on file as of 05/21/2020.     Objective:  Lab Results  Component Value Date   HGBA1C 7.5 (H) 03/24/2020   HGBA1C 8.1 (H) 12/23/2019   HGBA1C 10.5 (H) 09/19/2019   Lab Results  Component Value Date   MICROALBUR 80 01/22/2019   LDLCALC 66 07/24/2019   CREATININE 1.17 05/11/2020   BP Readings from Last 3 Encounters:    05/11/20 135/66  04/26/20 111/61  04/21/20 126/80    Goals Addressed      Patient Stated   .  "I need help with Mr. Starks...he can't move today" (pt-stated)        Wife stated    [place] (see longitudinal plan of care for additional care plan information)  Current Barriers:  [name] Knowledge Deficits related to etiology and treatment management for worsening symptoms of weakness, anuria, immobility, muscle rigidity with recent fall and ED visit  . Chronic Disease Management support and education needs related to Type II DM, non-insulin dependent, CKD, stage III, Essential hypertension, Dementia, Parkinsonism   Nurse Case Manager Clinical Goal(s):  [name] Over the next 24 hours, patient will follow up on recommendations to return to his local ED for evaluation and treatment for worsening symptoms of weakness, anuria, immobility, and muscle rigidity  CCM RN CM Interventions:  0722/21 Inbound call completed with spouse [name] . Inter-disciplinary care team collaboration (see longitudinal plan of care) . Determined patient is currently inpatient at [facility] located in [place], [place] . Determined [name] was informed he may be discharged as early as Saturday, May 23, 2020 . Determined [name] does not feel he is ready to return home and has filed an appeal . Determined she has not yet spoken with the SW at this facility for discharge planning . Discussed DME needs, including the continued need for a semi electric hospital bed . Instructed [name] to contact the facility SW to discuss discharge planning and DME needs, she verbalizes understanding and is agreeable  . Discussed plans with patient for ongoing care management follow up and provided patient with direct contact information for care management team   Patient Self Care Activities:  . Patient verbalizes understanding of plan to work with inpatient PT for demonstration of best way to assist patient with transfers  and mobility  . Attends all scheduled provider appointments . Calls provider office for new concerns or questions . Unable to self administer medications as prescribed . Unable to perform ADLs independently . Unable to perform IADLs independently . Supportive spouse to assist with care needs   Please see past updates related to this goal by clicking on the "Past Updates" button in the selected goal        Plan:   Telephone follow up appointment with care management team member scheduled for: 06/02/20  [name], RN, BSN, CCM Care Management Coordinator [place] [facility]  Direct Phone: [phone]

## 2020-05-22 NOTE — Patient Instructions (Signed)
Visit Information  Goals Addressed              This Visit's Progress     Patient Stated   .  "I need help with Mr. Law...he can't move today" (pt-stated)        Wife stated    [place] (see longitudinal plan of care for additional care plan information)  Current Barriers:  [name] Knowledge Deficits related to etiology and treatment management for worsening symptoms of weakness, anuria, immobility, muscle rigidity with recent fall and ED visit  . Chronic Disease Management support and education needs related to Type II DM, non-insulin dependent, CKD, stage III, Essential hypertension, Dementia, Parkinsonism   Nurse Case Manager Clinical Goal(s):  [name] Over the next 24 hours, patient will follow up on recommendations to return to his local ED for evaluation and treatment for worsening symptoms of weakness, anuria, immobility, and muscle rigidity  CCM RN CM Interventions:  0722/21 Inbound call completed with spouse [name] . Inter-disciplinary care team collaboration (see longitudinal plan of care) . Determined patient is currently inpatient at [facility] located in [place], [place] . Determined [name] was informed he may be discharged as early as Saturday, May 23, 2020 . Determined [name] does not feel he is ready to return home and has filed an appeal . Determined she has not yet spoken with the SW at this facility for discharge planning . Discussed DME needs, including the continued need for a semi electric hospital bed . Instructed [name] to contact the facility SW to discuss discharge planning and DME needs, she verbalizes understanding and is agreeable  . Discussed plans with patient for ongoing care management follow up and provided patient with direct contact information for care management team   Patient Self Care Activities:  . Patient verbalizes understanding of plan to work with inpatient PT for demonstration of best way to assist patient with transfers  and mobility  . Attends all scheduled provider appointments . Calls provider office for new concerns or questions . Unable to self administer medications as prescribed . Unable to perform ADLs independently . Unable to perform IADLs independently . Supportive spouse to assist with care needs   Please see past updates related to this goal by clicking on the "Past Updates" button in the selected goal        Patient verbalizes understanding of instructions provided today.   Telephone follow up appointment with care management team member scheduled for: 06/02/20  [name], RN, BSN, CCM Care Management Coordinator [place] [facility]  Direct Phone: [phone]

## 2020-05-26 ENCOUNTER — Ambulatory Visit: Payer: Medicare HMO | Admitting: Neurology

## 2020-05-27 ENCOUNTER — Ambulatory Visit: Payer: Medicare HMO | Admitting: Cardiology

## 2020-05-29 ENCOUNTER — Ambulatory Visit: Payer: Medicare HMO | Admitting: Podiatry

## 2020-05-29 ENCOUNTER — Other Ambulatory Visit: Payer: Self-pay

## 2020-05-29 ENCOUNTER — Encounter: Payer: Self-pay | Admitting: Podiatry

## 2020-05-29 DIAGNOSIS — M79674 Pain in right toe(s): Secondary | ICD-10-CM

## 2020-05-29 DIAGNOSIS — B351 Tinea unguium: Secondary | ICD-10-CM | POA: Diagnosis not present

## 2020-05-29 DIAGNOSIS — S90819A Abrasion, unspecified foot, initial encounter: Secondary | ICD-10-CM | POA: Diagnosis not present

## 2020-05-29 DIAGNOSIS — E1151 Type 2 diabetes mellitus with diabetic peripheral angiopathy without gangrene: Secondary | ICD-10-CM | POA: Diagnosis not present

## 2020-05-29 DIAGNOSIS — M79675 Pain in left toe(s): Secondary | ICD-10-CM | POA: Diagnosis not present

## 2020-05-30 NOTE — Progress Notes (Signed)
Subjective: Dylan [name]. presents today for follow up of at risk foot care. Pt has h/o NIDDM with chronic kidney disease and PAD.  He also has painful mycotic nails b/l that are difficult to trim. Aggravating factors include wearing enclosed shoe gear. Pain is relieved with periodic professional debridement.  Dylan [name] is accompanied by his wife on today's visit.  His wife states Dylan [name] now resides at [facility] in [place].  She states he sustained a fall a while ago at home resulting in injury to the right foot.  The abrasions appear to be healing.      No Known Allergies   Objective: There were no vitals filed for this visit.  Dylan [name] is a pleasant 79 y.o. year old African-American male in NAD. AAO x 3.   Vascular Examination:  Capillary fill time to digits delayed. Faintly palpable DP pulses b/l. Faintly palpable PT pulses b/l. Pedal hair present b/l. Skin temperature gradient within normal limits b/l. Trace edema noted b/l feet.  Dermatological Examination: Pedal skin with normal turgor, texture and tone bilaterally. No open wounds bilaterally. No interdigital macerations bilaterally. Toenails 1-5 b/l elongated, dystrophic, thickened, crumbly with subungual debris and tenderness to dorsal palpation.   He does have several healing abrasions on the dorsal aspect of his right foot.  There is no erythema, no edema, no drainage, no flocculence noted.  Musculoskeletal: Normal muscle strength 5/5 to all lower extremity muscle groups bilaterally. No pain crepitus or joint limitation noted with ROM b/l. No gross bony deformities bilaterally. Utilizes wheelchair for mobility assistance.  Neurological: Protective sensation intact 5/5 intact bilaterally with 10g monofilament b/l. Vibratory sensation intact b/l. Babinski reflex negative b/l. Clonus negative b/l.  Assessment: 1. Pain due to onychomycosis of toenails of both feet   2. Abrasion, foot w/o  infection   3. Type II diabetes mellitus with peripheral circulatory disorder (HCC)    Plan: -Continue diabetic foot care principles. Literature dispensed on today.  -For abrasions on the right foot, order written for [facility] to apply triple antibiotic ointment once daily until they are healed. -Toenails 1-5 b/l were debrided in length and girth with sterile nail nippers and dremel without iatrogenic bleeding.  -Patient to continue soft, supportive shoe gear daily. -Patient to report any pedal injuries to medical professional immediately. -Patient/POA to call should there be question/concern in the interim.  Return in about 3 months (around 08/29/2020) for diabetic nail trim.

## 2020-06-01 ENCOUNTER — Telehealth: Payer: Medicare HMO

## 2020-06-01 ENCOUNTER — Telehealth: Payer: Self-pay

## 2020-06-01 ENCOUNTER — Ambulatory Visit (INDEPENDENT_AMBULATORY_CARE_PROVIDER_SITE_OTHER): Payer: Medicare HMO

## 2020-06-01 ENCOUNTER — Other Ambulatory Visit: Payer: Self-pay

## 2020-06-01 DIAGNOSIS — N1831 Chronic kidney disease, stage 3a: Secondary | ICD-10-CM

## 2020-06-01 DIAGNOSIS — E1121 Type 2 diabetes mellitus with diabetic nephropathy: Secondary | ICD-10-CM | POA: Diagnosis not present

## 2020-06-01 DIAGNOSIS — I10 Essential (primary) hypertension: Secondary | ICD-10-CM

## 2020-06-01 DIAGNOSIS — F0391 Unspecified dementia with behavioral disturbance: Secondary | ICD-10-CM

## 2020-06-01 DIAGNOSIS — N183 Chronic kidney disease, stage 3 unspecified: Secondary | ICD-10-CM

## 2020-06-01 DIAGNOSIS — E1122 Type 2 diabetes mellitus with diabetic chronic kidney disease: Secondary | ICD-10-CM

## 2020-06-01 DIAGNOSIS — G20C Parkinsonism, unspecified: Secondary | ICD-10-CM

## 2020-06-01 DIAGNOSIS — G20 Parkinson's disease: Secondary | ICD-10-CM

## 2020-06-01 NOTE — Telephone Encounter (Addendum)
°  Chronic Care Management   Outreach Note  06/01/2020 Name: Dylan [name]. MRN: [id number] DOB: 22-Dec-1940  Referred by: [name], MD Reason for referral : No chief complaint on file.   An unsuccessful telephone outreach was attempted today. The patient was referred to the case management team for assistance with care management and care coordination.   Follow Up Plan: A HIPPA compliant phone message was left for the patient providing contact information and requesting a return call.  Telephone follow up appointment with care management team member scheduled for: 06/09/20  [name], RN, BSN, CCM Care Management Coordinator [place] [facility]  Direct Phone: [phone]

## 2020-06-01 NOTE — Telephone Encounter (Signed)
I called to schedule the pt a f/u because he was recently [facility].  I tried to schedule with Laurance Flatten, FNP-BC and Ms. [name] declined and said that he needed to see Dr Baird Cancer.  I told her that the next available was in September.  [name] was ok with waiting until September 15th.

## 2020-06-02 ENCOUNTER — Telehealth: Payer: Self-pay

## 2020-06-02 ENCOUNTER — Ambulatory Visit: Payer: Medicare HMO

## 2020-06-02 DIAGNOSIS — N1831 Chronic kidney disease, stage 3a: Secondary | ICD-10-CM

## 2020-06-02 DIAGNOSIS — E1122 Type 2 diabetes mellitus with diabetic chronic kidney disease: Secondary | ICD-10-CM

## 2020-06-02 DIAGNOSIS — N183 Chronic kidney disease, stage 3 unspecified: Secondary | ICD-10-CM

## 2020-06-02 NOTE — Patient Instructions (Signed)
Social Worker Visit Information  Goals we discussed today:  Goals Addressed            This Visit's Progress   . "I would like some resources for adult daycare's in the area and private duty nursing services"   On track    [place] (see longtitudinal plan of care for additional care plan information)  Current Barriers:  [name] Knowledge Deficits related to education and resource information on locating an adult daycare and or reliable trustworthy private duty nursing services . Chronic Disease Management support and education needs related to Type II DM, CKD stage III, Essential HTN  Nurse Case Manager Clinical Goal(s):  [name] Over the next 30 days, patient will work with embedded [place] to address needs related to resources for assistance with patient care such as adult daycare and private duty nursing   Goal not met due to inability to maintain patient contact . New 04/22/20 Over the next 45 days the patient will work with SW to become more knowledgeable of caregiver resources available to the patient  CCM SW Interventions Completed 06/02/20 with [place] with RN Care Manager regarding patient care coordination needs post-discharge from SNF . Successful outbound call placed to [name] to assess for care coordination needs . Determined [name] is interested in programs to provide interaction for the patient . Discussed planned home health SW home visit scheduled for 8/4 . Advised [name] to contact embedded SW directly if resources are still needed following planned home visit . Scheduled follow up call over the next month to assess goal progression  Patient Self Care Activities:  . Calls provider office for new concerns or questions . Unable to self administer medications as prescribed . Lacks social connections . Unable to perform ADLs independently . Unable to perform IADLs independently  Please see past updates related to this goal  by clicking on the "Past Updates" button in the selected goal          Follow Up Plan: SW will follow up with patient by phone over the next month. Please contact me directly as needed prior to next scheduled outreach.   Daneen Schick, BSW, CDP Social Worker, Certified Dementia Practitioner [place] [facility] [phone]

## 2020-06-02 NOTE — Patient Instructions (Signed)
Visit Information  Goals Addressed            This Visit's Progress     Patient Stated   .         Wife stated    CARE PLAN ENTRY (see longitudinal plan of care for additional care plan information)  Current Barriers:  [name] Knowledge Deficits related to etiology and treatment management for worsening symptoms of weakness, anuria, immobility, muscle rigidity with recent fall and ED visit  . Chronic Disease Management support and education needs related to Type II DM, non-insulin dependent, CKD, stage III, Essential hypertension, Dementia, Parkinsonism   Nurse Case Manager Clinical Goal(s):  [name] Over the next 24 hours, patient will follow up on recommendations to return to his local ED for evaluation and treatment for worsening symptoms of weakness, anuria, immobility, and muscle rigidity  CCM RN CM Interventions:  06/01/20 Inbound call completed with spouse [name] . Inter-disciplinary care team collaboration (see longitudinal plan of care) . Determined patient was discharged from [facility] located in [place], [place] . Discussed and reviewed post discharge recommendations for continued in home PT/OT/ST, spouse is unable to provide the name of HHA but is communicating with the PT to coordinate initial visit   . Reviewed patient medication changes per hospital discharge, spouse is questioning new regimen, sent in basket message to [name] with notification . Received in basket message from [place] stating spouse refused post hospital f/u with FNP due to [name] is booked until later September . Discussed importance of post hospital f/u with PCP provider, spouse agreed to see FNP for earlier appointment, in basket message sent to [facility] for scheduling . Assessed for DME needs, determined patient has semi-electric hospital bed and new eating utensils that have improved his ability to self feed . Determined patient has all other DME needed at this time but may benefit from having a bed or chair  exit alarm due to dementia and fall risk . Obtained resources for chair alarm per embedded BSW to provide to spouse at next call  . Determined spouse feels she is able to manage caring for Mr. Spillers at this time and feels she has the services in place that are needed . Educated spouse on fall safety; Educated on orthostatic hypotension; Educated on importance of having patient change positions slowly using caution and assistance of spouse and or DME to help avoid falls . Educated on importance of ensuring patient is staying well hydrated, reviewed urine should be clear yellow, w/o unusual odor or blood tinged . Reviewed and discussed post hospital f/u appointments scheduled with Dr. Adrian Prows set for 8/9 @1 :30 PM  . Discussed plans with patient for ongoing care management follow up and provided patient with direct contact information for care management team  Patient Self Care Activities:  . Patient verbalizes understanding of plan to work with inpatient PT for demonstration of best way to assist patient with transfers and mobility  . Attends all scheduled provider appointments . Calls provider office for new concerns or questions . Unable to self administer medications as prescribed . Unable to perform ADLs independently . Unable to perform IADLs independently . Supportive spouse to assist with care needs   Please see past updates related to this goal by clicking on the "Past Updates" button in the selected goal        Patient verbalizes understanding of instructions provided today.   Telephone follow up appointment with care management team member scheduled for: 06/09/20  [name], RN, BSN, CCM Care Management Coordinator [place] [facility]  Direct Phone: [phone]

## 2020-06-02 NOTE — Telephone Encounter (Signed)
°  Chronic Care Management   Outreach Note  06/02/2020 Name: Dylan [name]. MRN: [id number] DOB: 31-Jan-1941  Referred by: [name], MD Reason for referral : Care Coordination   SW placed an unsuccessful outbound call to the patients spouse and caregiver, Dylan [name], to assess goal progression. SW left a HIPAA compliant voice message requesting a return call.   Follow Up Plan: Collaboration with RN Care Manager who had successful contact with the patients caregiver on 06/01/20. SW will plan a follow up call over the next month.  Daneen Schick, BSW, CDP Social Worker, Certified Dementia Practitioner [place] [facility] [place] [facility] [phone]

## 2020-06-02 NOTE — Chronic Care Management (AMB) (Signed)
Chronic Care Management   Follow Up Note   06/01/2020 Name: Dylan [name]. MRN: [id number] DOB: [birth year]  Referred by: [name], MD Reason for referral : Chronic Care Management (FU RN CM Inbound call )   Dylan [name]. is a 79 y.o. year old male who is a primary care patient of [name], MD. The CCM team was consulted for assistance with chronic disease management and care coordination needs.    Review of patient status, including review of consultants reports, relevant laboratory and other test results, and collaboration with appropriate care team members and the patient's provider was performed as part of comprehensive patient evaluation and provision of chronic care management services.    SDOH (Social Determinants of Health) assessments performed: Yes See Care Plan activities for detailed interventions related to Dylan [name])   Received inbound call from spouse Dylan [name] regarding patient's recent d/c from skilled rehabilitation.     Outpatient Encounter Medications as of 06/01/2020  Medication Sig Note  . allopurinol (ZYLOPRIM) 100 MG tablet Take 2 tablets (200 mg total) by mouth daily. (Patient taking differently: Take 200 mg by mouth every morning. )   . carbidopa-levodopa (SINEMET IR) 25-100 MG tablet Take 1.5 tablets by mouth 3 (three) times daily. (Patient taking differently: Take 1.5 tablets by mouth See admin instructions. Take 1 1/2  tablet by mouth three times daily - 10am, 5pm and 10pm)   . carvedilol (COREG) 25 MG tablet Take 0.5 tablets (12.5 mg total) by mouth 2 (two) times daily.   [name] COLCRYS 0.6 MG tablet Take 1 tablet (0.6 mg total) by mouth daily. (Patient taking differently: Take 0.6 mg by mouth daily as needed (gout attacks). )   . memantine (NAMENDA) 10 MG tablet Take 1 tablet (10 mg total) by mouth 2 (two) times daily.   . pravastatin (PRAVACHOL) 20 MG tablet TAKE 1 TABLET BY ORAL ROUTE EVERY DAY (Patient taking differently: Take 20 mg by mouth  every morning. )   . rivastigmine (EXELON) 4.6 mg/24hr Place 1 patch (4.6 mg total) onto the skin daily. (Patient taking differently: Place 4.6 mg onto the skin every evening. ) 04/27/2020: Current patch is on back of right shoulder  . TRULICITY 1.5 OE/4.2PN SOPN INJECT 1.5 MG INTO THE SKIN ONCE A WEEK. (Patient taking differently: Inject 1.5 mg into the skin every Monday. )   . acetaminophen (TYLENOL) 325 MG tablet Take 2 tablets (650 mg total) by mouth every 6 (six) hours as needed for mild pain (or Fever >/= 101).   . Ascorbic Acid (VITAMIN C) 1000 MG tablet Take 1,000 mg by mouth every morning.    [name] aspirin 81 MG tablet Take 81 mg by mouth every Monday, Wednesday, and Friday.    . Cholecalciferol (VITAMIN D3) 5000 units CAPS Take 5,000 Units by mouth daily with supper.    . Coenzyme Q10 200 MG capsule Take 200 mg by mouth daily with supper.    . diclofenac Sodium (VOLTAREN) 1 % GEL Apply 2 g topically 3 (three) times daily. (Patient not taking: Reported on 04/27/2020)   . 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 Lancet Devices (ONE TOUCH DELICA LANCING DEV) MISC Please fill ONE TOUCH DELICA LANCING DEVICE.  Use to test blood sugar twice daily as directed. E11.65   . Lancets MISC Please fill basic/generic push button lancets.  Patient to test blood sugar twice daily. DX: E11.65   . Melatonin  10 MG TABS Take 10 mg by mouth at bedtime.   . metFORMIN (GLUCOPHAGE) 1000 MG tablet TAKE 1 TABLET BY MOUTH EVERY DAY (Patient taking differently: Take 1,000 mg by mouth every morning. )    No facility-administered encounter medications on file as of 06/01/2020.     Objective:  Lab Results  Component Value Date   HGBA1C 7.5 (H) 03/24/2020   HGBA1C 8.1 (H) 12/23/2019   HGBA1C 10.5 (H) 09/19/2019   Lab Results  Component Value Date   MICROALBUR 80 01/22/2019   LDLCALC 66 07/24/2019   CREATININE 1.17 05/11/2020   BP Readings from Last 3  Encounters:  05/11/20 135/66  04/26/20 111/61  04/21/20 126/80    Goals Addressed      Patient Stated   .  "I need help with Dylan [name]...he can't move today" (pt-stated)        Wife stated    Dylan [name] (see longitudinal plan of care for additional care plan information)  Current Barriers:  [name] Knowledge Deficits related to etiology and treatment management for worsening symptoms of weakness, anuria, immobility, muscle rigidity with recent fall and ED visit  . Chronic Disease Management support and education needs related to Type II DM, non-insulin dependent, CKD, stage III, Essential hypertension, Dementia, Parkinsonism   Nurse Case Manager Clinical Goal(s):  [name] Over the next 24 hours, patient will follow up on recommendations to return to his local ED for evaluation and treatment for worsening symptoms of weakness, anuria, immobility, and muscle rigidity  CCM RN CM Interventions:  06/01/20 Inbound call completed with spouse Dylan [name] . Inter-disciplinary care team collaboration (see longitudinal plan of care) . Determined patient was discharged from [facility] located in [place], [place] . Discussed and reviewed post discharge recommendations for continued in home PT/OT/ST, spouse is unable to provide the name of HHA but is communicating with the PT to coordinate initial visit   . Reviewed patient medication changes per hospital discharge, spouse is questioning new regimen, sent in basket message to Dylan [name] with notification . Received in basket message from [facility] [place] stating spouse refused post hospital f/u with FNP due to Dylan [name] is booked until later September . Discussed importance of post hospital f/u with PCP provider, spouse agreed to see FNP for earlier appointment, in basket message sent to [facility] for scheduling . Assessed for DME needs, determined patient has semi-electric hospital bed and new eating utensils that have improved his ability to self feed . Determined patient  has all other DME needed at this time but may benefit from having a bed or chair exit alarm due to dementia and fall risk . Obtained resources for chair alarm per embedded BSW to provide to spouse at next call  . Determined spouse feels she is able to manage caring for Dylan [name] at this time and feels she has the services in place that are needed . Educated spouse on fall safety; Educated on orthostatic hypotension; Educated on importance of having patient change positions slowly using caution and assistance of spouse and or DME to help avoid falls . Educated on importance of ensuring patient is staying well hydrated, reviewed urine should be clear yellow, w/o unusual odor or blood tinged . Reviewed and discussed post hospital f/u appointments scheduled with Dylan [name] set for 8/9 @1 :30 PM  . Discussed plans with patient for ongoing care management follow up and provided patient with direct contact information for care management team  Patient Self Care Activities:  .  Patient verbalizes understanding of plan to work with inpatient PT for demonstration of best way to assist patient with transfers and mobility  . Attends all scheduled provider appointments . Calls provider office for new concerns or questions . Unable to self administer medications as prescribed . Unable to perform ADLs independently . Unable to perform IADLs independently . Supportive spouse to assist with care needs   Please see past updates related to this goal by clicking on the "Past Updates" button in the selected goal        Plan:   Telephone follow up appointment with care management team member scheduled for: 06/09/20  [name], RN, BSN, CCM Care Management Coordinator [facility]  Direct Phone: [phone]

## 2020-06-02 NOTE — Chronic Care Management (AMB) (Signed)
Chronic Care Management    Social Work Follow Up Note  06/02/2020 Name: Dylan [name]. MRN: [id number] DOB: [birth year]  Dylan [name]. is a 79 y.o. year old male who is a primary care patient of [name], MD. The CCM team was consulted for assistance with care coordination.   Review of patient status, including review of consultants reports, other relevant assessments, and collaboration with appropriate care team members and the patient's provider was performed as part of comprehensive patient evaluation and provision of chronic care management services.    SDOH (Social Determinants of Health) assessments performed: No    Outpatient Encounter Medications as of 06/02/2020  Medication Sig Note  . acetaminophen (TYLENOL) 325 MG tablet Take 2 tablets (650 mg total) by mouth every 6 (six) hours as needed for mild pain (or Fever >/= 101).   [name] allopurinol (ZYLOPRIM) 100 MG tablet Take 2 tablets (200 mg total) by mouth daily. (Patient taking differently: Take 200 mg by mouth every morning. )   . Ascorbic Acid (VITAMIN C) 1000 MG tablet Take 1,000 mg by mouth every morning.    [name] aspirin 81 MG tablet Take 81 mg by mouth every Monday, Wednesday, and Friday.    . carbidopa-levodopa (SINEMET IR) 25-100 MG tablet Take 1.5 tablets by mouth 3 (three) times daily. (Patient taking differently: Take 1.5 tablets by mouth See admin instructions. Take 1 1/2  tablet by mouth three times daily - 10am, 5pm and 10pm)   . carvedilol (COREG) 25 MG tablet Take 0.5 tablets (12.5 mg total) by mouth 2 (two) times daily.   . Cholecalciferol (VITAMIN D3) 5000 units CAPS Take 5,000 Units by mouth daily with supper.    . Coenzyme Q10 200 MG capsule Take 200 mg by mouth daily with supper.    . COLCRYS 0.6 MG tablet Take 1 tablet (0.6 mg total) by mouth daily. (Patient taking differently: Take 0.6 mg by mouth daily as needed (gout attacks). )   . diclofenac Sodium (VOLTAREN) 1 % GEL Apply 2 g topically 3 (three) times  daily. (Patient not taking: Reported on 04/27/2020)   . 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 Lancet Devices (ONE TOUCH DELICA LANCING DEV) MISC Please fill ONE TOUCH DELICA LANCING DEVICE.  Use to test blood sugar twice daily as directed. E11.65   . Lancets MISC Please fill basic/generic push button lancets.  Patient to test blood sugar twice daily. DX: E11.65   . Melatonin 10 MG TABS Take 10 mg by mouth at bedtime.   . memantine (NAMENDA) 10 MG tablet Take 1 tablet (10 mg total) by mouth 2 (two) times daily.   . metFORMIN (GLUCOPHAGE) 1000 MG tablet TAKE 1 TABLET BY MOUTH EVERY DAY (Patient taking differently: Take 1,000 mg by mouth every morning. )   . pravastatin (PRAVACHOL) 20 MG tablet TAKE 1 TABLET BY ORAL ROUTE EVERY DAY (Patient taking differently: Take 20 mg by mouth every morning. )   . rivastigmine (EXELON) 4.6 mg/24hr Place 1 patch (4.6 mg total) onto the skin daily. (Patient taking differently: Place 4.6 mg onto the skin every evening. ) 04/27/2020: Current patch is on back of right shoulder  . TRULICITY 1.5 NG/2.9BM SOPN INJECT 1.5 MG INTO THE SKIN ONCE A WEEK. (Patient taking differently: Inject 1.5 mg into the skin every Monday. )    No facility-administered encounter medications on file as of 06/02/2020.     Goals Addressed  This Visit's Progress   . "I would like some resources for adult daycare's in the area and private duty nursing services"   On track    [place] (see longtitudinal plan of care for additional care plan information)  Current Barriers:  [name] Knowledge Deficits related to education and resource information on locating an adult daycare and or reliable trustworthy private duty nursing services . Chronic Disease Management support and education needs related to Type II DM, CKD stage III, Essential HTN  Nurse Case Manager Clinical Goal(s):  [name] Over the next 30 days, patient will work  with embedded [place] to address needs related to resources for assistance with patient care such as adult daycare and private duty nursing   Goal not met due to inability to maintain patient contact . New 04/22/20 Over the next 45 days the patient will work with SW to become more knowledgeable of caregiver resources available to the patient  CCM SW Interventions Completed 06/02/20 with [place] with RN Care Manager regarding patient care coordination needs post-discharge from SNF . Successful outbound call placed to [name] to assess for care coordination needs . Determined [name] is interested in programs to provide interaction for the patient . Discussed planned home health SW home visit scheduled for 8/4 . Advised [name] to contact embedded SW directly if resources are still needed following planned home visit . Scheduled follow up call over the next month to assess goal progression  Patient Self Care Activities:  . Calls provider office for new concerns or questions . Unable to self administer medications as prescribed . Lacks social connections . Unable to perform ADLs independently . Unable to perform IADLs independently  Please see past updates related to this goal by clicking on the "Past Updates" button in the selected goal          Follow Up Plan: SW will follow up with patient by phone over the next month. SW encouraged [name] to contact the SW sooner as needed.   Daneen Schick, BSW, CDP Social Worker, Certified Dementia Practitioner [place] [facility] [phone]  Total time spent performing care coordination and/or care management activities with the patient by phone or face to face = 15 minutes.

## 2020-06-03 ENCOUNTER — Other Ambulatory Visit: Payer: Self-pay

## 2020-06-03 ENCOUNTER — Ambulatory Visit (INDEPENDENT_AMBULATORY_CARE_PROVIDER_SITE_OTHER): Payer: Medicare HMO | Admitting: Nurse Practitioner

## 2020-06-03 DIAGNOSIS — G20 Parkinson's disease: Secondary | ICD-10-CM

## 2020-06-03 DIAGNOSIS — T8131XA Disruption of external operation (surgical) wound, not elsewhere classified, initial encounter: Secondary | ICD-10-CM

## 2020-06-03 DIAGNOSIS — N181 Chronic kidney disease, stage 1: Secondary | ICD-10-CM

## 2020-06-03 DIAGNOSIS — E1121 Type 2 diabetes mellitus with diabetic nephropathy: Secondary | ICD-10-CM

## 2020-06-03 DIAGNOSIS — R296 Repeated falls: Secondary | ICD-10-CM

## 2020-06-03 DIAGNOSIS — E1122 Type 2 diabetes mellitus with diabetic chronic kidney disease: Secondary | ICD-10-CM

## 2020-06-03 DIAGNOSIS — N1831 Chronic kidney disease, stage 3a: Secondary | ICD-10-CM

## 2020-06-03 NOTE — Progress Notes (Signed)
This visit occurred during the SARS-CoV-2 public health emergency.  Safety protocols were in place, including screening questions prior to the visit, additional usage of staff PPE, and extensive cleaning of exam room while observing appropriate contact time as indicated for disinfecting solutions.  Subjective:     Patient ID: Dylan [name]. , male    DOB: Apr 17, 1941 , 79 y.o.   MRN: [id number]   Chief Complaint  Patient presents with  . Hospitalization Follow-up    HPI  Here for hospital follow up after having a fall.  He was there for 2 weeks then went to [facility] for 2 weeks.  Has been home since Sunday.  He has been doing okay since being home.  He was able to put food in microwave and basically dress himself, was able to shave self not well, was able to wash himself.  Now not able to do any of that, just now able to feed himself.  He has an OT spoon.  PT to come today - this will be his first day.    She has restarted him on namenda and stopped colcrys.   Reports never was a sleeper but has not started him back on his sleep medication.  She does notice he sleeps better with the tv off.   [name] patients fiance is planning to get a life alert.    Blood pressure ranging 115-119/59-65 and blood sugar 118-126.   His basic problem is sitting.  He is using a rolling walker.  She does have a wheelchair in the house. Today when took a shower it was difficult. He does have railings and a seated shower chair.  Has hospital bed.   She reports his eyes have been running a lot had eye drops but are old. He was sitting in bed when in the hospital then  He has been getting up without the walker or cane.      Past Medical History:  Diagnosis Date  . Anxiety   . Aortic stenosis    mild AS 11/24/16 (peak grad 24, mean grad 11) Dr. [name]  . CHB (complete heart block) ([place]) 07/2017  . 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 Diabetes mellitus without  complication ([place])   . Gait abnormality   . Gout   . Hyperlipemia   . Hypertension   . 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 Memory loss   . Peripheral arterial disease ([place])   . 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 Second degree AV block    Wenckebach; no indication for pacemaker as of 12/01/16 (Dr. [name])  . Vitamin B12 deficiency anemia 03/01/2018  . Vitamin D deficiency disease      Family History  Problem Relation Age of Onset  . Diabetes Mother   . Breast cancer Mother   . Arthritis Mother   . Hypertension Father   . Diabetes Sister   . Diabetes Brother   . Diabetes Maternal Grandmother   . Diabetes Brother   . Diabetes Brother   . Diabetes Brother   . Diabetes Sister   . Diabetes Sister   . Diabetes Sister      Current Outpatient Medications:  .  allopurinol (ZYLOPRIM) 100 MG tablet, Take 2 tablets (200 mg total) by mouth daily. (Patient taking differently: Take 200 mg by mouth every morning. ), Disp: 180 tablet, Rfl: 5 .  carbidopa-levodopa (SINEMET IR) 25-100 MG tablet, Take 1.5 tablets by mouth 3 (  three) times daily. (Patient taking differently: Take 1.5 tablets by mouth See admin instructions. Take 1 1/2  tablet by mouth three times daily - 10am, 5pm and 10pm), Disp: 130 tablet, Rfl: 11 .  carvedilol (COREG) 25 MG tablet, Take 0.5 tablets (12.5 mg total) by mouth 2 (two) times daily., Disp: , Rfl:  .  Cholecalciferol (VITAMIN D3) 5000 units CAPS, Take 5,000 Units by mouth daily with supper. , Disp: , Rfl:  .  diclofenac Sodium (VOLTAREN) 1 % GEL, Apply 2 g topically 3 (three) times daily., Disp: 150 g, Rfl: 0 .  glucose blood (ONETOUCH VERIO) test strip, Use as instructed to check blood sugars daily dx: e11.22, Disp: 150 each, Rfl: 3 .  Lancet Devices (ONE TOUCH DELICA LANCING DEV) MISC, Please fill ONE TOUCH DELICA LANCING DEVICE.  Use to test blood sugar twice daily as directed. E11.65, Disp: 1 each, Rfl: 1 .  memantine (NAMENDA) 10  MG tablet, Take 1 tablet (10 mg total) by mouth 2 (two) times daily., Disp: 180 tablet, Rfl: 4 .  metFORMIN (GLUCOPHAGE) 1000 MG tablet, TAKE 1 TABLET BY MOUTH EVERY DAY (Patient taking differently: Take 1,000 mg by mouth every morning. ), Disp: 30 tablet, Rfl: 2 .  pravastatin (PRAVACHOL) 20 MG tablet, TAKE 1 TABLET BY ORAL ROUTE EVERY DAY (Patient taking differently: Take 20 mg by mouth every morning. ), Disp: 30 tablet, Rfl: 2 .  rivastigmine (EXELON) 4.6 mg/24hr, Place 1 patch (4.6 mg total) onto the skin daily. (Patient taking differently: Place 4.6 mg onto the skin every evening. ), Disp: 30 patch, Rfl: 12 .  TRULICITY 1.5 WL/7.9GX SOPN, INJECT 1.5 MG INTO THE SKIN ONCE A WEEK. (Patient taking differently: Inject 1.5 mg into the skin every Monday. ), Disp: 12 pen, Rfl: 1 .  acetaminophen (TYLENOL) 325 MG tablet, Take 2 tablets (650 mg total) by mouth every 6 (six) hours as needed for mild pain (or Fever >/= 101). (Patient not taking: Reported on 06/03/2020), Disp: , Rfl:  .  Ascorbic Acid (VITAMIN C) 1000 MG tablet, Take 1,000 mg by mouth every morning.  (Patient not taking: Reported on 06/03/2020), Disp: , Rfl:  .  aspirin 81 MG tablet, Take 81 mg by mouth every Monday, Wednesday, and Friday.  (Patient not taking: Reported on 06/03/2020), Disp: , Rfl:  .  Coenzyme Q10 200 MG capsule, Take 200 mg by mouth daily with supper.  (Patient not taking: Reported on 06/03/2020), Disp: , Rfl:  .  COLCRYS 0.6 MG tablet, Take 1 tablet (0.6 mg total) by mouth daily. (Patient not taking: Reported on 06/03/2020), Disp: 90 tablet, Rfl: 1 .  Lancets MISC, Please fill basic/generic push button lancets.  Patient to test blood sugar twice daily. DX: E11.65 (Patient not taking: Reported on 06/03/2020), Disp: 100 each, Rfl: 4 .  Melatonin 10 MG TABS, Take 10 mg by mouth at bedtime. (Patient not taking: Reported on 06/03/2020), Disp: , Rfl:    No Known Allergies   Review of Systems  Constitutional: Negative.   Respiratory:  Negative.   Cardiovascular: Negative.  Negative for chest pain, palpitations and leg swelling.  Psychiatric/Behavioral: Negative.      There were no vitals filed for this visit. There is no height or weight on file to calculate BMI.   Objective:  Physical Exam Constitutional:      General: He is not in acute distress. Cardiovascular:     Rate and Rhythm: Normal rate and regular rhythm.     Pulses: Normal  pulses.     Heart sounds: Normal heart sounds. No murmur heard.   Pulmonary:     Effort: Pulmonary effort is normal. No respiratory distress.     Breath sounds: Normal breath sounds.  Neurological:     Mental Status: He is alert.  Psychiatric:        Mood and Affect: Mood normal.        Speech: Speech is delayed.        Behavior: Behavior normal.        Cognition and Memory: Memory is impaired.         Assessment And Plan:     1. Parkinsonism, unspecified Parkinsonism type [facility])  Will refer to palliative care for goals of care this was requested after his hospital stay.  - Ambulatory referral to Hospice  2. Frequent falls  Fall last month, had a hospital stay then to rehab for 2 weeks  Was treated for dehydration   No falls since being home  PT to make first visit today - Ambulatory referral to Hospice  3. Type 2 diabetes mellitus with stage 3a chronic kidney disease, without long-term current use of insulin (HCC)  Chronic, fair control  Continue with current medications, will check HgbA1c   Encouraged to limit intake of sugary foods and drinks - Hemoglobin A1c  4. Disruption of closure of skin, initial encounter  Right shin area with healing wound which had a scab, yellow center, cleansed with NS and applied bacitracin and non stick telfa. Advised patient and fiance to continue to watch for worsening symptoms.      Patient was given opportunity to ask questions. Patient verbalized understanding of the plan and was able to repeat key elements of the  plan. All questions were answered to their satisfaction.  Minette Brine, FNP   I, Minette Brine, FNP, have reviewed all documentation for this visit. The documentation on 06/04/20 for the exam, diagnosis, procedures, and orders are all accurate and complete.   THE PATIENT IS ENCOURAGED TO PRACTICE SOCIAL DISTANCING DUE TO THE COVID-19 PANDEMIC.

## 2020-06-04 ENCOUNTER — Encounter: Payer: Self-pay | Admitting: Nurse Practitioner

## 2020-06-04 LAB — HEMOGLOBIN A1C
Est. average glucose Bld gHb Est-mCnc: 154 mg/dL
Hgb A1c MFr Bld: 7 % — ABNORMAL HIGH (ref 4.8–5.6)

## 2020-06-05 ENCOUNTER — Other Ambulatory Visit: Payer: Self-pay | Admitting: Internal Medicine

## 2020-06-05 ENCOUNTER — Other Ambulatory Visit: Payer: Self-pay | Admitting: Neurology

## 2020-06-08 ENCOUNTER — Telehealth: Payer: Self-pay

## 2020-06-08 ENCOUNTER — Encounter: Payer: Self-pay | Admitting: Cardiology

## 2020-06-08 ENCOUNTER — Other Ambulatory Visit: Payer: Self-pay

## 2020-06-08 ENCOUNTER — Telehealth: Payer: Self-pay | Admitting: Cardiology

## 2020-06-08 ENCOUNTER — Ambulatory Visit: Payer: Medicare HMO | Admitting: Cardiology

## 2020-06-08 VITALS — BP 96/48 | HR 69 | Resp 16 | Ht 71.0 in | Wt 170.0 lb

## 2020-06-08 DIAGNOSIS — G903 Multi-system degeneration of the autonomic nervous system: Secondary | ICD-10-CM

## 2020-06-08 DIAGNOSIS — Z950 Presence of cardiac pacemaker: Secondary | ICD-10-CM

## 2020-06-08 DIAGNOSIS — Z66 Do not resuscitate: Secondary | ICD-10-CM | POA: Insufficient documentation

## 2020-06-08 DIAGNOSIS — I442 Atrioventricular block, complete: Secondary | ICD-10-CM

## 2020-06-08 DIAGNOSIS — I10 Essential (primary) hypertension: Secondary | ICD-10-CM

## 2020-06-08 HISTORY — DX: Presence of cardiac pacemaker: Z95.0

## 2020-06-08 MED ORDER — CARVEDILOL 25 MG PO TABS: 12.5000 mg | ORAL_TABLET | Freq: Two times a day (BID) | ORAL | Status: DC

## 2020-06-08 NOTE — Telephone Encounter (Signed)
[name] [name] said that Dr [name] has the pt taking 1/2 tab of carvedilol and that he didn't say if the pt needed to be still taking the Valsartan or the allopurinol, or baby aspirin.  I told [name] [name] that [name] is out of the office this week and I would give her the message when she returns and that Dr. [name] would have said at the pt's visit today if he needed to start the Valsartan medication again.

## 2020-06-08 NOTE — Telephone Encounter (Signed)
Have her call Dr. Milderd Meager office to get clarification regarding the valsartan.

## 2020-06-08 NOTE — Progress Notes (Signed)
Primary Physician/Referring:  [name], MD  Patient ID: Dylan Ina., male    DOB: [birth year], 79 y.o.   MRN: [id number]  Chief Complaint  Patient presents with  . Complete AV Block  . Hospitalization Follow-up   HPI:    Gifford Ballon.  is a 79 y.o. African-American male with complete heart block SP [employer] Jude pacemaker implantation on 07/11/2020, hypertension, hyperlipidemia, severe dementia and Parkinson's disease who I had seen 3 years ago, he was lost to follow-up after pacemaker implantation.  He was admitted to [facility] on 04/27/2020 with syncope, severe orthostatic hypotension, acute kidney injury with elevated CK enzymes from a fall.  After diuresis, holding his blood pressure medications, serum creatinine improved, was discharged home and recommended outpatient evaluation.  He is accompanied by his wife today.  Patient is very slow to react and speaks very few words.  Denies any specific symptoms or complaints.  Wife gives most of the history.  She did not bring her list of medications but does admit that there was a lot of changes done.  He has not had any syncope since hospital discharge.  Still feels extremely weak in his legs when he stands up.  Past Medical History:  Diagnosis Date  . Anxiety   . Aortic stenosis    mild AS 11/24/16 (peak grad 24, mean grad 11) Dr. [name]  . CHB (complete heart block) ([place]) 07/2017  . 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 Diabetes mellitus without complication ([place])   . Gait abnormality   . Gout   . Hyperlipemia   . Hypertension   . 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 Memory loss   . Peripheral arterial disease ([place])   . 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 Second degree AV block    Wenckebach; no indication for pacemaker as of 12/01/16 (Dr. [name])  . Vitamin B12 deficiency anemia 03/01/2018  . Vitamin D deficiency disease    Past Surgical History:  Procedure  Laterality Date  . CARDIOVASCULAR STRESS TEST     11/21/16 Low risk study, EF 52% [facility] Cardiovascular)  . EYE SURGERY    . KYPHOPLASTY N/A 02/15/2017   Procedure: LUMBAR FOUR KYPHOPLASTY;  Surgeon: Phylliss Bob, MD;  Location: [place];  Service: Orthopedics;  Laterality: N/A;  . lipoma surgery     neck - 30 years ago  . PACEMAKER IMPLANT N/A 07/11/2017   Procedure: Pacemaker Implant;  Surgeon: Constance Haw, MD;  Location: [place] CV LAB;  Service: Cardiovascular;  Laterality: N/A;  . TRANSTHORACIC ECHOCARDIOGRAM     11/24/16 [facility] CV): EF 55-60%, mild AS, mild-mod MR, mod TR, moderate pulm HTN, PAP 49 mmHg   Family History  Problem Relation Age of Onset  . Diabetes Mother   . Breast cancer Mother   . Arthritis Mother   . Hypertension Father   . Diabetes Sister   . Diabetes Brother   . Diabetes Maternal Grandmother   . Diabetes Brother   . Diabetes Brother   . Diabetes Brother   . Diabetes Sister   . Diabetes Sister   . Diabetes Sister     Social History   Tobacco Use  . Smoking status: Former Smoker    Packs/day: 0.50    Years: 7.00    Pack years: 3.50  . Smokeless tobacco: Never Used  . Tobacco comment: quit 35 years  Substance Use Topics  . Alcohol use: No   Marital  Status: Married  ROS  Review of Systems  Constitutional: Positive for malaise/fatigue.  Neurological: Positive for disturbances in coordination, dizziness, loss of balance and tremors.  Psychiatric/Behavioral: Positive for memory loss.   Objective  Blood pressure (!) 96/48, pulse 69, resp. rate 16, height 5\' 11"  (1.803 m), weight 170 lb (77.1 kg), SpO2 97 %.  Vitals with BMI 06/08/2020 05/11/2020 05/11/2020  Height 5\' 11"  - -  Weight 170 lbs - -  BMI 71.06 - -  Systolic 96 269 485  Diastolic 48 66 79  Pulse 69 60 63     Physical Exam Constitutional:      General: He is not in acute distress. Cardiovascular:     Rate and Rhythm: Normal rate and regular rhythm.     Pulses: Intact  distal pulses.     Heart sounds: Heart sounds are distant. No murmur heard.  No gallop.      Comments: No leg edema, no JVD. Pulmonary:     Effort: Pulmonary effort is normal.     Breath sounds: Normal breath sounds.  Abdominal:     General: Bowel sounds are normal.     Palpations: Abdomen is soft.  Neurological:     Mental Status: He is alert.     Motor: Weakness present.     Coordination: Coordination abnormal.     Gait: Gait abnormal.    Laboratory examination:   Recent Labs    05/01/20 0427 05/01/20 0427 05/04/20 0423 05/06/20 0809 05/11/20 0430  NA 140  --  138 135  --   K 3.3*  --  3.5 3.5  --   CL 103  --  105 101  --   CO2 26  --  26 24  --   GLUCOSE 112*  --  99 213*  --   BUN 23  --  20 16  --   CREATININE 1.30*   < > 1.13 1.12 1.17  CALCIUM 9.5  --  9.2 9.1  --   GFRNONAA 52*   < > >60 >60 59*  GFRAA >60   < > >60 >60 >60   < > = values in this interval not displayed.   CrCl cannot be calculated (Patient's most recent lab result is older than the maximum 21 days allowed.).  CMP Latest Ref Rng & Units 05/11/2020 05/06/2020 05/04/2020  Glucose 70 - 99 mg/dL - 213(H) 99  BUN 8 - 23 mg/dL - 16 20  Creatinine 0.61 - 1.24 mg/dL 1.17 1.12 1.13  Sodium 135 - 145 mmol/L - 135 138  Potassium 3.5 - 5.1 mmol/L - 3.5 3.5  Chloride 98 - 111 mmol/L - 101 105  CO2 22 - 32 mmol/L - 24 26  Calcium 8.9 - 10.3 mg/dL - 9.1 9.2  Total Protein 6.5 - 8.1 g/dL - - -  Total Bilirubin 0.3 - 1.2 mg/dL - - -  Alkaline Phos 38 - 126 U/L - - -  AST 15 - 41 U/L - - -  ALT 0 - 44 U/L - - -   CBC Latest Ref Rng & Units 05/06/2020 04/28/2020 04/27/2020  WBC 4.0 - 10.5 K/uL 4.3 6.8 7.2  Hemoglobin 13.0 - 17.0 g/dL 11.7(L) 11.1(L) 11.7(L)  Hematocrit 39 - 52 % 36.1(L) 34.3(L) 36.1(L)  Platelets 150 - 400 K/uL 203 125(L) 145(L)    Lipid Panel Recent Labs    07/24/19 1228  CHOL 129  TRIG 70  LDLCALC 66  HDL 49  CHOLHDL 2.6  HEMOGLOBIN A1C Lab Results  Component Value Date    HGBA1C 7.0 (H) 06/03/2020   MPG 163 02/09/2017   TSH Recent Labs    04/27/20 1844  TSH 1.679   Medications and allergies  No Known Allergies   Outpatient Medications Prior to Visit  Medication Sig Dispense Refill  . Ascorbic Acid (VITAMIN C) 1000 MG tablet Take 1,000 mg by mouth every morning.     . carbidopa-levodopa (SINEMET IR) 25-100 MG tablet Take 1.5 tablets by mouth 3 (three) times daily. (Patient taking differently: Take 1.5 tablets by mouth See admin instructions. Take 1 1/2  tablet by mouth three times daily - 10am, 5pm and 10pm) 130 tablet 11  . Cholecalciferol (VITAMIN D3) 5000 units CAPS Take 5,000 Units by mouth daily with supper.     . diclofenac Sodium (VOLTAREN) 1 % GEL Apply 2 g topically 3 (three) times daily. 150 g 0  . glucose blood (ONETOUCH VERIO) test strip Use as instructed to check blood sugars daily dx: e11.22 150 each 3  . Lancet Devices (ONE TOUCH DELICA LANCING DEV) MISC Please fill ONE TOUCH DELICA LANCING DEVICE.  Use to test blood sugar twice daily as directed. E11.65 1 each 1  . Lancets MISC Please fill basic/generic push button lancets.  Patient to test blood sugar twice daily. DX: E11.65 100 each 4  . Melatonin 10 MG TABS Take 10 mg by mouth at bedtime.     . memantine (NAMENDA) 10 MG tablet Take 1 tablet (10 mg total) by mouth 2 (two) times daily. 180 tablet 4  . metFORMIN (GLUCOPHAGE) 1000 MG tablet TAKE 1 TABLET BY MOUTH EVERY DAY (Patient taking differently: Take 1,000 mg by mouth every morning. ) 30 tablet 2  . pravastatin (PRAVACHOL) 20 MG tablet TAKE 1 TABLET BY ORAL ROUTE EVERY DAY 30 tablet 2  . rivastigmine (EXELON) 4.6 mg/24hr Place 1 patch (4.6 mg total) onto the skin daily. (Patient taking differently: Place 4.6 mg onto the skin every evening. ) 30 patch 12  . TRULICITY 1.5 HY/0.7PX SOPN INJECT 1.5 MG INTO THE SKIN ONCE A WEEK. (Patient taking differently: Inject 1.5 mg into the skin every Monday. ) 12 pen 1  . carvedilol (COREG) 25 MG  tablet Take 0.5 tablets (12.5 mg total) by mouth 2 (two) times daily. (Patient taking differently: Take 25 mg by mouth 2 (two) times daily. )    . valsartan (DIOVAN) 320 MG tablet TAKE 1 TABLET BY MOUTH EVERY DAY 30 tablet 2  . acetaminophen (TYLENOL) 325 MG tablet Take 2 tablets (650 mg total) by mouth every 6 (six) hours as needed for mild pain (or Fever >/= 101). (Patient not taking: Reported on 06/03/2020)    . COLCRYS 0.6 MG tablet Take 1 tablet (0.6 mg total) by mouth daily. (Patient not taking: Reported on 06/03/2020) 90 tablet 1  . allopurinol (ZYLOPRIM) 100 MG tablet Take 2 tablets (200 mg total) by mouth daily. (Patient not taking: Reported on 06/08/2020) 180 tablet 5  . amLODipine (NORVASC) 5 MG tablet Take 5 mg by mouth daily. (Patient not taking: Reported on 06/08/2020)    . aspirin 81 MG tablet Take 81 mg by mouth every Monday, Wednesday, and Friday.  (Patient not taking: Reported on 06/03/2020)    . Coenzyme Q10 200 MG capsule Take 200 mg by mouth daily with supper.  (Patient not taking: Reported on 06/03/2020)     No facility-administered medications prior to visit.   Meds ordered this encounter  Medications  .  carvedilol (COREG) 25 MG tablet    Sig: Take 0.5 tablets (12.5 mg total) by mouth 2 (two) times daily.    Dose increase   Medications Discontinued During This Encounter  Medication Reason  . valsartan (DIOVAN) 320 MG tablet Discontinued by provider  . allopurinol (ZYLOPRIM) 100 MG tablet Discontinued by provider  . aspirin 81 MG tablet Discontinued by provider  . Coenzyme Q10 200 MG capsule Patient has not taken in last 30 days  . amLODipine (NORVASC) 5 MG tablet Discontinued by provider  . carvedilol (COREG) 25 MG tablet      Radiology:   No results found.  Cardiac Studies:   Echocardiogram 04/28/2020:  1. Left ventricular ejection fraction, by estimation, is 50 to 55%. The  left ventricle has low normal function. The left ventricle has no regional  wall motion  abnormalities. Left ventricular diastolic parameters are  consistent with Grade I diastolic  dysfunction (impaired relaxation).  2. Right ventricular systolic function is normal. The right ventricular  size is normal. There is moderately elevated pulmonary artery systolic  pressure.  3. The mitral valve is normal in structure. Mild mitral valve  regurgitation.  4. The aortic valve is tricuspid. Aortic valve regurgitation is mild.  Mild to moderate aortic valve sclerosis/calcification is present, without  any evidence of aortic stenosis.  5.  Compared to the study done on 11/24/2016, moderate tricuspid regurgitation and moderate pulmonary hypertension not present, previously mild aortic stenosis reported.  EKG  EKG 06/08/2020: Underlying sinus rhythm with first-degree AV block at rate of 69 bpm, left atrial abnormality, ventricularly paced rhythm.  No further analysis.      Assessment     ICD-10-CM   1. Neurogenic orthostatic hypotension (HCC)  G90.3   2. 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HCC)  I44.2 EKG 12-Lead  3. Essential hypertension  I10 carvedilol (COREG) 25 MG tablet  4. Pacemaker [place] MRI  model [id number] 07/11/2020  Z95.0   5. DNR (do not resuscitate)  Z66      Recommendations:   Loney Domingo. is a 79 y.o.  African-American male with complete heart block SP [employer] Jude pacemaker implantation on 07/11/2020, hypertension, hyperlipidemia, severe dementia and Parkinson's disease who I had seen 3 years ago, he was lost to follow-up after pacemaker implantation.  He was admitted to [facility] on 04/27/2020 with syncope, severe orthostatic hypotension, acute kidney injury with elevated CK enzymes from a fall.  After diuresis, holding his blood pressure medications, serum creatinine improved, was discharged home and recommended outpatient evaluation.  He is accompanied by his wife today.  His main complaint is marked generalized weakness and gait instability.  He was severely  hypotensive today.  Patient was supposed to have stopped taking valsartan but is back on taking it as per his wife.  I have discontinued valsartan, reduced the dose of carvedilol from 12.5 mg to 12.5 mg twice daily, patient has stopped taking amlodipine which I again discontinued today.  No indication for aspirin hence this was discontinued as well.  I have given instructions to his wife to check his blood pressure on a regular basis and to hold carvedilol if systolic blood pressure is <120 mmHg.  Medications were reconciled, clear instructions regarding medications were given to his wife including handwritten instructions.  Advised him to bring his medications to the appointments to simplify his management.  His pacemaker appears to be functioning normally, he has not seen me in 3 years, I will request pacemaker transmission  to be released to our clinic to simplify patient's care, patient severely demented, patient's wife having difficulty bringing him to multiple physician offices, she wishes to have care in 1 place.  I would like to see him back in 4 weeks for follow-up.  This was a 60-minute encounter.  Patient is DNR per his and wife's wishes and I agree on this approach.    Adrian Prows, MD, [facility] 06/08/2020, 9:45 PM Office: [phone]

## 2020-06-09 ENCOUNTER — Telehealth: Payer: Medicare HMO

## 2020-06-09 ENCOUNTER — Telehealth: Payer: Self-pay

## 2020-06-09 ENCOUNTER — Ambulatory Visit: Payer: Self-pay

## 2020-06-09 DIAGNOSIS — G20 Parkinson's disease: Secondary | ICD-10-CM

## 2020-06-09 DIAGNOSIS — N183 Chronic kidney disease, stage 3 unspecified: Secondary | ICD-10-CM

## 2020-06-09 DIAGNOSIS — N1831 Chronic kidney disease, stage 3a: Secondary | ICD-10-CM

## 2020-06-09 DIAGNOSIS — E1122 Type 2 diabetes mellitus with diabetic chronic kidney disease: Secondary | ICD-10-CM

## 2020-06-09 DIAGNOSIS — I951 Orthostatic hypotension: Secondary | ICD-10-CM

## 2020-06-09 DIAGNOSIS — I10 Essential (primary) hypertension: Secondary | ICD-10-CM

## 2020-06-09 DIAGNOSIS — F0391 Unspecified dementia with behavioral disturbance: Secondary | ICD-10-CM

## 2020-06-09 NOTE — Telephone Encounter (Signed)
The pt's spouse [name] was told that [name] said to check with Dr. [name] about if the pt needed to still be on the valsartan and [name] said that Dr. [name] took the pt off of the med and that she still wanted to make sure with Dr Baird Cancer.

## 2020-06-10 NOTE — Telephone Encounter (Signed)
LMOVM requesting call back to [place]. Direct number and office hours provided. Will confirm transfer in [facility]

## 2020-06-11 ENCOUNTER — Telehealth: Payer: Self-pay | Admitting: Cardiology

## 2020-06-11 ENCOUNTER — Telehealth: Payer: Self-pay | Admitting: Nurse Practitioner

## 2020-06-11 NOTE — Telephone Encounter (Signed)
Spoke with pt fiance, Dylan [name] (DPR on file)  She confirmed that pt does wish to transfer management of [employer] PM to Dr. Irven Shelling office.    Patient released in [place], awaiting receiving office to transfer in.

## 2020-06-11 NOTE — Telephone Encounter (Signed)
Follow Up:     Pt was returning a call from yesterday. He did not know who called him, or what it was about.

## 2020-06-11 NOTE — Patient Instructions (Signed)
Visit Information  Goals Addressed      Patient Stated   .  COMPLETED: "I need help with Dylan [name]...he can't move today" (pt-stated)        Wife stated    [place] (see longitudinal plan of care for additional care plan information)  Current Barriers:  [name] Knowledge Deficits related to etiology and treatment management for worsening symptoms of weakness, anuria, immobility, muscle rigidity with recent fall and ED visit  . Chronic Disease Management support and education needs related to Type II DM, non-insulin dependent, CKD, stage III, Essential hypertension, Dementia, Parkinsonism   Nurse Case Manager Clinical Goal(s):  [name] Over the next 24 hours, patient will follow up on recommendations to return to his local ED for evaluation and treatment for worsening symptoms of weakness, anuria, immobility, and muscle rigidity Goal Met   CCM RN CM Interventions:  06/09/20 completed call with spouse Dylan [name] . Inter-disciplinary care team collaboration (see longitudinal plan of care) . Determined patient was discharged from [facility] located in [place], [place] . Discussed and reviewed post discharge recommendations for continued in home PT/OT/ST, spouse is unable to provide the name of HHA but is communicating with the PT to coordinate initial visit   . Reviewed patient medication changes per hospital discharge, spouse is questioning new regimen, sent in basket message to [name] with notification . Received in basket message from [place] stating spouse refused post hospital f/u with FNP due to [name] is booked until later September . Discussed importance of post hospital f/u with PCP provider, spouse agreed to see FNP for earlier appointment, in basket message sent to [facility] for scheduling . Assessed for DME needs, determined patient has semi-electric hospital bed and new eating utensils that have improved his ability to self feed . Determined patient has all other DME needed at this time  but may benefit from having a bed or chair exit alarm due to dementia and fall risk . Obtained resources for chair alarm per embedded BSW to provide to spouse at next call  . Determined spouse feels she is able to manage caring for Dylan [name] at this time and feels she has the services in place that are needed . Educated spouse on fall safety; Educated on orthostatic hypotension; Educated on importance of having patient change positions slowly using caution and assistance of spouse and or DME to help avoid falls . Educated on importance of ensuring patient is staying well hydrated, reviewed urine should be clear yellow, w/o unusual odor or blood tinged . Reviewed and discussed post hospital f/u appointments scheduled with Dr. Adrian Prows set for 8/9 '@1' :30 PM  . Discussed plans with patient for ongoing care management follow up and provided patient with direct contact information for care management team  Patient Self Care Activities:  . Patient verbalizes understanding of plan to work with inpatient PT for demonstration of best way to assist patient with transfers and mobility  . Attends all scheduled provider appointments . Calls provider office for new concerns or questions . Unable to self administer medications as prescribed . Unable to perform ADLs independently . Unable to perform IADLs independently . Supportive spouse to assist with care needs   Please see past updates related to this goal by clicking on the "Past Updates" button in the selected goal        Other   .  "He's eating too many sweets"   On track     Spouse stated Current Barriers:  .  Knowledge Deficits related to diabetes Meal planning using the plate method and portion control . Chronic Disease Management support and education needs related to CKDIII, DMII, HTN, memory loss  Nurse Case Manager Clinical Goal(s):  . 04/03/20 New  Over the next 90 days, spouse will report patient continues to modify his diet to  recommended ADA diet and is taking his diabetic medications exactly as prescribed in order to better manage his DM and achieve lowering his A1c <7.0 %  CCM RN CM Interventions:  06/09/20 call completed with spouse Dylan [name] . Evaluation of current treatment plan related to diabetes and patient's adherence to plan as established by provider . Reinforced education to patient/spouse Dylan [name] re: importance of adherence to following a low carb diet using Meal Planning with the plate method with portion control; reviewed patient's recent A1c has decreased to 7.0 % obtained on 06/04/20; Positive reinforcement given to spouse and patient for making efforts to lower his A1c; reinforced importance to work on lowering A1c with goal <7.0  . Reviewed and discussed patient's current GFR and ways to self improve renal function . Reviewed medications with patient and discussed patient is adhering to taking his weekly injection of Trulicity and patient is self injecting; with spouse Dylan [name] supervising dosage and administration . Reiterated to spouse Dylan [name], providing education and rationale, to check cbg daily before meals and record, calling the CCM team and or PCP for findings outside established parameters; FBS 80-130 and <180 after meals . Discussed plans with patient for ongoing care management follow up and provided patient with direct contact information for care management team  Patient Self Care Activities:  . Attends all scheduled provider appointments . Currently UNABLE TO independently perform self-care without assistance  . Supportive caregiver, spouse Dylan [name] to assist with caregiver needs  Please see past updates related to this goal by clicking on the "Past Updates" button in the selected goal      .  "His balance is much worse and he needs a walker"   On track     [place] (see longitudinal plan of care for additional care plan information)  Spouse states Current Barriers:  . Impaired  gait/balance . High Risk for Falls . 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 . Newly diagnosed; Mild chronic microvascular ischemic changes  Nurse Case Manager Clinical Goal(s):  [name] Over the next 30 days, patient will work with the CCM team to address needs related to impaired gait disturbance and high risk for falls Goal Met  . 08/02/19 Over the next 30 days, patient will complete outpatient PT and will be able to adhere to his prescribed HEP Goal Partially Met . New 03/12/20 Over the next 90 days, caregiver will confirm patient is using his DME as directed by MD and PCP to assist with mobility and gait AS EVIDENCE BY patient will experience improved mobility w/o falls or injury related to falls  CCM RN CM Interventions:  06/09/20 call completed with spouse Dylan [name]  . Inter-disciplinary care team collaboration (see longitudinal plan of care) . Evaluation of current treatment plan related to Impaired Physical Mobility and patient's adherence to plan as established by provider . Determined patient has been d/c home from in patient rehabilitation and is currently receiving in home PT/OT . Determined patient has a semi-electric hospital bed and all other DME needed at this time . Discussed spouse's concerns that patient has exited the bed w/o her assistance and she is  fearful he will have a fall . Provided spouse with the contact information for a chair exit alarm; discussed how this home safety device works; discussed this safety device is not covered by Medicare and will have to be paid for out of pocket; discussed DME suppliers in the area and provided spouse with the contact numbers and address's . Determined patient has not experienced any falls since being discharged home from the hospital . Discussed plans with patient for ongoing care management follow up and provided patient with direct contact information for care management  team  Patient Self Care Activities:  . Attends all scheduled provider appointments . Currently UNABLE TO independently perform self-care without assistance  . Supportive caregiver, spouse Dylan [name] to assist with caregiver needs  Please see past updates related to this goal by clicking on the "Past Updates" button in the selected goal      .  COMPLETED: "I don't know what the next steps are for Dylan [name] Dementia"        Spouse stated  Current Barriers:  [name] Knowledge Deficits related to next steps for diagnosis and treatment for new diagnosis of Dementia . Chronic Disease Management support and education needs related to DMII, Essential HTN, CKDIII, Dementia with behavior changes  Nurse Case Manager Clinical Goal(s):  [name] Over the next 14 days, spouse will collaborative with the Neuro office and receive instructions regarding brain imaging ordered for determination of type of Dementia - Goal Met  . Over the next 90 days, patient will verbalize understanding of plan for treatment management for new dx: Dementia  Goal Met . 01/16/20 New Over the next 14 days, patient will complete a follow up visit with Dr. Krista Blue, neurologist and spouse will report all new or worsening symptoms related to cognitive and behavior changes Goal Met  . 01/16/20 New Over the next 30 days, patient and spouse will verbalize having a good understanding of the treatment plan recommended by Dr. Krista Blue and will adhere to her recommendations Goal Met . 01/16/20 New Over the next 90 days, spouse will be able to provide the care needed to patient without difficulty or caregiver burnout AEB spouse will report feeling satisfied with the care she is able to provide Dylan [name] and will verbalize having all resources needed Goal Met  CCM RN CM Interventions:  06/09/20: call completed with spouse Dylan [name]  Determined [name] has all the services in place for her spouse at this time, she feels she is able to provide the care needed  and has a good support system in place with her daughter and healthcare team   Encouraged patient to take time for herself and to balance her activity with rest  Encouraged patient to ask for help when needed and to say yes if someone offers to provide help or respite  Discussed plans with [name] for ongoing care management follow up and confirmed she has the direct contact information for care management team  Patient Self Care Activities  . Attends all scheduled provider appointments . Currently UNABLE TO independently perform self-care without assistance  . Supportive caregiver, spouse Dylan [name] to assist with caregiver needs  Please see past updates related to this goal by clicking on the "Past Updates" button in the selected goal      .  "to evaluate and treat chronic persistent cough"   On track     [employer] (see longitudinal plan of care for additional care plan information)  Current Barriers:  .  Knowledge Deficits related to evaluation and treatment of persistent cough  . Chronic Disease Management support and education needs related to Type II DM, non-insulin dependent, CKD, stage III, Essential hypertension . Cognitive Deficits . Parkinsonism . Risk for Aspiration   Nurse Case Manager Clinical Goal(s):  [name] Over the next 90 days, patient will work with established Neurologist and PCP to address needs related to evaluation and treatment of persistent cough and or aspiration.   CCM RN CM Interventions:  06/09/20 Placed outbound call to [facility] . Determined patient is receiving in home ST for treatment of dysphagia and dystonia  . Re-educated on s/s suggestive of aspiration and when to call the doctor if symptoms occur; Reviewed ways to help prevent aspiration  . Discussed plans with patient for ongoing care management follow up and provided patient with direct contact information for care management team  Patient Self Care Activities:  . Attends all  scheduled provider appointments . Currently UNABLE TO independently perform self-care without assistance  . Supportive caregiver, spouse Dylan [name] to assist with caregiver needs  Please see past updates related to this goal by clicking on the "Past Updates" button in the selected goal        Patient verbalizes understanding of instructions provided today.   Telephone follow up appointment with care management team member scheduled for: 07/30/20  [name], RN, BSN, CCM Care Management Coordinator [facility]  Direct Phone: [phone]

## 2020-06-11 NOTE — Telephone Encounter (Signed)
Looks like our device clinic tried to reach out to the pt.

## 2020-06-11 NOTE — Chronic Care Management (AMB) (Signed)
Chronic Care Management   Follow Up Note   06/09/2020 Name: Dylan [name]. MRN: [id number] DOB: 26-Jun-1941  Referred by: [name], MD Reason for referral : Chronic Care Management (FU RN CM Call )   Dylan [name]. is a 79 y.o. year old male who is a primary care patient of [name], MD. The CCM team was consulted for assistance with chronic disease management and care coordination needs.    Review of patient status, including review of consultants reports, relevant laboratory and other test results, and collaboration with appropriate care team members and the patient's provider was performed as part of comprehensive patient evaluation and provision of chronic care management services.    SDOH (Social Determinants of Health) assessments performed: Yes - no acute challenges noted at this time  See Care Plan activities for detailed interventions related to [name])   Placed outbound CCM RN CM follow up call to spouse for a care plan update.     Outpatient Encounter Medications as of 06/09/2020  Medication Sig Note  . acetaminophen (TYLENOL) 325 MG tablet Take 2 tablets (650 mg total) by mouth every 6 (six) hours as needed for mild pain (or Fever >/= 101). (Patient not taking: Reported on 06/03/2020)   . Ascorbic Acid (VITAMIN C) 1000 MG tablet Take 1,000 mg by mouth every morning.    . carbidopa-levodopa (SINEMET IR) 25-100 MG tablet Take 1.5 tablets by mouth 3 (three) times daily. (Patient taking differently: Take 1.5 tablets by mouth See admin instructions. Take 1 1/2  tablet by mouth three times daily - 10am, 5pm and 10pm)   . carvedilol (COREG) 25 MG tablet Take 0.5 tablets (12.5 mg total) by mouth 2 (two) times daily.   . Cholecalciferol (VITAMIN D3) 5000 units CAPS Take 5,000 Units by mouth daily with supper.    . COLCRYS 0.6 MG tablet Take 1 tablet (0.6 mg total) by mouth daily. (Patient not taking: Reported on 06/03/2020)   . diclofenac Sodium (VOLTAREN) 1 % GEL Apply 2  g topically 3 (three) times daily.   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 Lancet Devices (ONE TOUCH DELICA LANCING DEV) MISC Please fill ONE TOUCH DELICA LANCING DEVICE.  Use to test blood sugar twice daily as directed. E11.65   . Lancets MISC Please fill basic/generic push button lancets.  Patient to test blood sugar twice daily. DX: E11.65   . Melatonin 10 MG TABS Take 10 mg by mouth at bedtime.    . memantine (NAMENDA) 10 MG tablet Take 1 tablet (10 mg total) by mouth 2 (two) times daily.   . metFORMIN (GLUCOPHAGE) 1000 MG tablet TAKE 1 TABLET BY MOUTH EVERY DAY (Patient taking differently: Take 1,000 mg by mouth every morning. )   . pravastatin (PRAVACHOL) 20 MG tablet TAKE 1 TABLET BY ORAL ROUTE EVERY DAY   . rivastigmine (EXELON) 4.6 mg/24hr Place 1 patch (4.6 mg total) onto the skin daily. (Patient taking differently: Place 4.6 mg onto the skin every evening. ) 04/27/2020: Current patch is on back of right shoulder  . TRULICITY 1.5 HD/6.2IW SOPN INJECT 1.5 MG INTO THE SKIN ONCE A WEEK. (Patient taking differently: Inject 1.5 mg into the skin every Monday. )    No facility-administered encounter medications on file as of 06/09/2020.     Objective:  Lab Results  Component Value Date   HGBA1C 7.0 (H) 06/03/2020   HGBA1C 7.5 (H) 03/24/2020  HGBA1C 8.1 (H) 12/23/2019   Lab Results  Component Value Date   MICROALBUR 80 01/22/2019   LDLCALC 66 07/24/2019   CREATININE 1.17 05/11/2020   BP Readings from Last 3 Encounters:  06/08/20 (!) 96/48  05/11/20 135/66  04/26/20 111/61    Goals Addressed      Patient Stated   .  COMPLETED: "I need help with Dylan [name]...he can't move today" (pt-stated)        Wife stated    [place] (see longitudinal plan of care for additional care plan information)  Current Barriers:  [name] Knowledge Deficits related to etiology and treatment management for worsening symptoms of  weakness, anuria, immobility, muscle rigidity with recent fall and ED visit  . Chronic Disease Management support and education needs related to Type II DM, non-insulin dependent, CKD, stage III, Essential hypertension, Dementia, Parkinsonism   Nurse Case Manager Clinical Goal(s):  [name] Over the next 24 hours, patient will follow up on recommendations to return to his local ED for evaluation and treatment for worsening symptoms of weakness, anuria, immobility, and muscle rigidity Goal Met   CCM RN CM Interventions:  06/09/20 completed call with spouse [name] . Inter-disciplinary care team collaboration (see longitudinal plan of care) . Determined patient was discharged from [facility] located in [place], [place] . Discussed and reviewed post discharge recommendations for continued in home PT/OT/ST, spouse is unable to provide the name of HHA but is communicating with the PT to coordinate initial visit   . Reviewed patient medication changes per hospital discharge, spouse is questioning new regimen, sent in basket message to [name] with notification . Received in basket message from [place] stating spouse refused post hospital f/u with FNP due to [name] is booked until later September . Discussed importance of post hospital f/u with PCP provider, spouse agreed to see FNP for earlier appointment, in basket message sent to [facility] for scheduling . Assessed for DME needs, determined patient has semi-electric hospital bed and new eating utensils that have improved his ability to self feed . Determined patient has all other DME needed at this time but may benefit from having a bed or chair exit alarm due to dementia and fall risk . Obtained resources for chair alarm per embedded BSW to provide to spouse at next call  . Determined spouse feels she is able to manage caring for Dylan [name] at this time and feels she has the services in place that are needed . Educated spouse on fall safety; Educated on  orthostatic hypotension; Educated on importance of having patient change positions slowly using caution and assistance of spouse and or DME to help avoid falls . Educated on importance of ensuring patient is staying well hydrated, reviewed urine should be clear yellow, w/o unusual odor or blood tinged . Reviewed and discussed post hospital f/u appointments scheduled with Dr. Adrian Prows set for 8/9 '@1' :30 PM  . Discussed plans with patient for ongoing care management follow up and provided patient with direct contact information for care management team  Patient Self Care Activities:  . Patient verbalizes understanding of plan to work with inpatient PT for demonstration of best way to assist patient with transfers and mobility  . Attends all scheduled provider appointments . Calls provider office for new concerns or questions . Unable to self administer medications as prescribed . Unable to perform ADLs independently . Unable to perform IADLs independently . Supportive spouse to assist with care needs   Please see  past updates related to this goal by clicking on the "Past Updates" button in the selected goal        Other   .  "He's eating too many sweets"   On track     Spouse stated Current Barriers:  [name] Knowledge Deficits related to diabetes Meal planning using the plate method and portion control . Chronic Disease Management support and education needs related to CKDIII, DMII, HTN, memory loss  Nurse Case Manager Clinical Goal(s):  . 04/03/20 New  Over the next 90 days, spouse will report patient continues to modify his diet to recommended ADA diet and is taking his diabetic medications exactly as prescribed in order to better manage his DM and achieve lowering his A1c <7.0 %  CCM RN CM Interventions:  06/09/20 call completed with spouse [name] . Evaluation of current treatment plan related to diabetes and patient's adherence to plan as established by provider . Reinforced education to  patient/spouse [name] re: importance of adherence to following a low carb diet using Meal Planning with the plate method with portion control; reviewed patient's recent A1c has decreased to 7.0 % obtained on 06/04/20; Positive reinforcement given to spouse and patient for making efforts to lower his A1c; reinforced importance to work on lowering A1c with goal <7.0  . Reviewed and discussed patient's current GFR and ways to self improve renal function . Reviewed medications with patient and discussed patient is adhering to taking his weekly injection of Trulicity and patient is self injecting; with spouse [name] supervising dosage and administration . Reiterated to spouse [name], providing education and rationale, to check cbg daily before meals and record, calling the CCM team and or PCP for findings outside established parameters; FBS 80-130 and <180 after meals . Discussed plans with patient for ongoing care management follow up and provided patient with direct contact information for care management team  Patient Self Care Activities:  . Attends all scheduled provider appointments . Currently UNABLE TO independently perform self-care without assistance  . Supportive caregiver, spouse [name] to assist with caregiver needs  Please see past updates related to this goal by clicking on the "Past Updates" button in the selected goal      .  "His balance is much worse and he needs a walker"   On track     [place] (see longitudinal plan of care for additional care plan information)  Spouse states Current Barriers:  . Impaired gait/balance . High Risk for Falls . 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 . Newly diagnosed; Mild chronic microvascular ischemic changes  Nurse Case Manager Clinical Goal(s):  [name] Over the next 30 days, patient will work with the CCM team to address needs related to impaired gait disturbance and high risk for  falls Goal Met  . 08/02/19 Over the next 30 days, patient will complete outpatient PT and will be able to adhere to his prescribed HEP Goal Partially Met . New 03/12/20 Over the next 90 days, caregiver will confirm patient is using his DME as directed by MD and PCP to assist with mobility and gait AS EVIDENCE BY patient will experience improved mobility w/o falls or injury related to falls  CCM RN CM Interventions:  06/09/20 call completed with spouse [name]  . Inter-disciplinary care team collaboration (see longitudinal plan of care) . Evaluation of current treatment plan related to Impaired Physical Mobility and patient's adherence to plan as established by provider . Determined  patient has been d/c home from in patient rehabilitation and is currently receiving in home PT/OT . Determined patient has a semi-electric hospital bed and all other DME needed at this time . Discussed spouse's concerns that patient has exited the bed w/o her assistance and she is fearful he will have a fall . Provided spouse with the contact information for a chair exit alarm; discussed how this home safety device works; discussed this safety device is not covered by Medicare and will have to be paid for out of pocket; discussed DME suppliers in the area and provided spouse with the contact numbers and address's . Determined patient has not experienced any falls since being discharged home from the hospital . Discussed plans with patient for ongoing care management follow up and provided patient with direct contact information for care management team  Patient Self Care Activities:  . Attends all scheduled provider appointments . Currently UNABLE TO independently perform self-care without assistance  . Supportive caregiver, spouse [name] to assist with caregiver needs  Please see past updates related to this goal by clicking on the "Past Updates" button in the selected goal      .  COMPLETED: "I don't know what the  next steps are for Dylan [name] Dementia"        Spouse stated  Current Barriers:  [name] Knowledge Deficits related to next steps for diagnosis and treatment for new diagnosis of Dementia . Chronic Disease Management support and education needs related to DMII, Essential HTN, CKDIII, Dementia with behavior changes  Nurse Case Manager Clinical Goal(s):  [name] Over the next 14 days, spouse will collaborative with the Neuro office and receive instructions regarding brain imaging ordered for determination of type of Dementia - Goal Met  . Over the next 90 days, patient will verbalize understanding of plan for treatment management for new dx: Dementia  Goal Met . 01/16/20 New Over the next 14 days, patient will complete a follow up visit with Dr. Krista Blue, neurologist and spouse will report all new or worsening symptoms related to cognitive and behavior changes Goal Met  . 01/16/20 New Over the next 30 days, patient and spouse will verbalize having a good understanding of the treatment plan recommended by Dr. Krista Blue and will adhere to her recommendations Goal Met . 01/16/20 New Over the next 90 days, spouse will be able to provide the care needed to patient without difficulty or caregiver burnout AEB spouse will report feeling satisfied with the care she is able to provide Dylan [name] and will verbalize having all resources needed Goal Met  CCM RN CM Interventions:  06/09/20: call completed with spouse [name]  Determined Dylan [name] has all the services in place for her spouse at this time, she feels she is able to provide the care needed and has a good support system in place with her daughter and healthcare team   Encouraged patient to take time for herself and to balance her activity with rest  Encouraged patient to ask for help when needed and to say yes if someone offers to provide help or respite  Discussed plans with Dylan [name] for ongoing care management follow up and confirmed she has the direct contact  information for care management team  Patient Self Care Activities  . Attends all scheduled provider appointments . Currently UNABLE TO independently perform self-care without assistance  . Supportive caregiver, spouse [name] to assist with caregiver needs  Please see past updates related to this goal by clicking on  the "Past Updates" button in the selected goal      .  "to evaluate and treat chronic persistent cough"   On track     [place] (see longitudinal plan of care for additional care plan information)  Current Barriers:  [name] Knowledge Deficits related to evaluation and treatment of persistent cough  . Chronic Disease Management support and education needs related to Type II DM, non-insulin dependent, CKD, stage III, Essential hypertension . Cognitive Deficits . Parkinsonism . Risk for Aspiration   Nurse Case Manager Clinical Goal(s):  [name] Over the next 90 days, patient will work with established Neurologist and PCP to address needs related to evaluation and treatment of persistent cough and or aspiration.   CCM RN CM Interventions:  06/09/20 Placed outbound call to [place] . Determined patient is receiving in home ST for treatment of dysphagia and dystonia  . Re-educated on s/s suggestive of aspiration and when to call the doctor if symptoms occur; Reviewed ways to help prevent aspiration  . Discussed plans with patient for ongoing care management follow up and provided patient with direct contact information for care management team  Patient Self Care Activities:  . Attends all scheduled provider appointments . Currently UNABLE TO independently perform self-care without assistance  . Supportive caregiver, spouse [name] to assist with caregiver needs  Please see past updates related to this goal by clicking on the "Past Updates" button in the selected goal        Plan:   Telephone follow up appointment with care management team member scheduled for:  07/30/20  [name], RN, BSN, CCM Care Management Coordinator [facility] [facility]  Direct Phone: [phone]

## 2020-06-11 NOTE — Telephone Encounter (Signed)
Just FYI, if I request to transfer, I would have already discussed with the patients. I have some patients that need EP to follow due to complexity, but some patients also prefer EP to follow. I already know them and will not ask for transfer. Going forwards, please transfer my patients to our clinic on our request and not make it hard for [place] or patients.  My EP colleagues all know this. Please let your colleagues know. Also some patients have been upset because the felt coaxed to stay with you as well. Call me if you have questions. [phone]. Let Ms Donia Pounds also know this.   JG

## 2020-06-11 NOTE — Telephone Encounter (Signed)
Spoke with patient's wife, [name], regarding Palliative services and all questions were answered and she was in agreement with scheduling a visit.  I have scheduled an In-person Consult for 06/22/20 @ 10:30 AM.

## 2020-06-16 ENCOUNTER — Ambulatory Visit: Payer: Medicare HMO

## 2020-06-16 ENCOUNTER — Other Ambulatory Visit: Payer: Self-pay

## 2020-06-16 DIAGNOSIS — N183 Chronic kidney disease, stage 3 unspecified: Secondary | ICD-10-CM

## 2020-06-16 DIAGNOSIS — E1122 Type 2 diabetes mellitus with diabetic chronic kidney disease: Secondary | ICD-10-CM

## 2020-06-16 DIAGNOSIS — F0391 Unspecified dementia with behavioral disturbance: Secondary | ICD-10-CM

## 2020-06-16 DIAGNOSIS — N1831 Chronic kidney disease, stage 3a: Secondary | ICD-10-CM

## 2020-06-16 MED ORDER — DESONIDE 0.05 % EX LOTN: TOPICAL_LOTION | Freq: Two times a day (BID) | CUTANEOUS | 1 refills | Status: DC

## 2020-06-16 NOTE — Patient Instructions (Signed)
Social Worker Visit Information  Goals we discussed today:  Goals Addressed            This Visit's Progress   . "I would like some resources for adult daycare's in the area and private duty nursing services"       [place] (see longtitudinal plan of care for additional care plan information)  Current Barriers:  [name] Knowledge Deficits related to education and resource information on locating an adult daycare and or reliable trustworthy private duty nursing services . Chronic Disease Management support and education needs related to Type II DM, CKD stage III, Essential HTN  Nurse Case Manager Clinical Goal(s):  [name] Over the next 30 days, patient will work with embedded [name] to address needs related to resources for assistance with patient care such as adult daycare and private duty nursing   Goal not met due to inability to maintain patient contact . New 04/22/20 Over the next 45 days the patient will work with SW to become more knowledgeable of caregiver resources available to the patient . New 06/16/20 Over the next 7 days the patient will work with SW to obtain respite placement   CCM SW Interventions Completed 06/16/20 . Collaboration with [name], [name] who reports the patient's spouse has contacted the practice to request an updated Fl2 for respite placement . Successful outbound call placed to [name] who indicates patient will be placed at [facility] in [place] on Friday August 20 . Completed top portion of Fl2 form . Collaboration with [name] to request Fl2 form be reviewed and signed upon adding updated medication list . Collaboration with [name], [place] who will fax form to [facility]  . Collaboration with RN Care Manager, [name], to inform of plan for placement  Completed 06/02/20 with [place] with RN Care Manager regarding patient care coordination needs  post-discharge from SNF . Successful outbound call placed to [name] to assess for care coordination needs . Determined [name] is interested in programs to provide interaction for the patient . Discussed planned home health SW home visit scheduled for 8/4 . Advised [name] to contact embedded SW directly if resources are still needed following planned home visit . Scheduled follow up call over the next month to assess goal progression  Completed 04/22/20 with [place] with [place] who indicated patient caregiver interest in respite resources to assist with occasional overnight care needs . Communication with [name] to assess caregiver respite needs . Determined [name] owns a home in [place] and is interested in resources to assist with patient care needs on weekends should she go out of town to [place] o Advised [name] there are minimal options to assist with weekend care outside of the home o Provided education surrounding local adult day program assisting with care needs M-F during regular business hours o Reviewed opportunity for respite placement in a facility but requiring a minimum stay; [name] reports interest for just 2-3 days at a time o Advised [name] the best option would be in home care provider by an in home aide company o Mailed a list of in home aide providers that service patients county . Discussed [name] continues to have plans to go on a trip with her grand-daughter in Spring of 2022 when her grand-daughter graduates o [name] reports she has contacted an ALF which is affordable and  close to home but they require a 14 day minimum respite state and she will only need a 10 day respite o Advised [name] most ALF do require a 14 day minimum stay o Attempted to provide a list of ALF communities within [facility] for [name] to contact; [name] declined stating she was okay with the  14 day stay as she felt this community was very affordable . Scheduled follow up call over the next 4 weeks to confirm receipt of mailed resource . Collaboration with RN Care Manager regarding plan for caregiver resources  Completed 02/07/20 with [name] . Successful outbound call to [name] to assist with resource education related to patient care . Discussed opportunity to enroll in an adult day program  o Informed by [name], the patient is not interested in or willing to attend an adult day program . Determined [name] is interested in respite placement "next year" for about 7 days when [place] plans to take her grand-daughter on a trip post graduation o [name] a respite stay would be offerred through assisted livings and would be sought when closer to date of placement needs o Discussed concerns of [name] wanting to know pricing now so she may save up funds - [name] reports she has contacted several ALF in [place] for pricing but wanted SW help regarding which place is "good" - [name], SW was unable to help plan a respite stay over a year in advance . Reviewed concern of patient being left alone when [name] goes out of town for weekend trips o Discussed opportunity to enroll patient in "Caregiver on call" program offered through [employer] o Provided information via mail . Determined the patient has experienced a fall this week but without reported injury o Collaboration with RN Care Manager to inform of recent fall . Discussed importance of keeping patient active and engaged both physically and cognitively o Provided education on local programs offered by [place]  o Discussed opportunity to attend "Connections" group respite program offered by [employer] o Mailed information to the patient and [name] for review . Scheduled follow up call over the next month to confirm receipt of mailing and assist with care coordination needs  CCM RN  CM Interventions:  01/30/20 call completed with spouse [name]  . Determined spouse would like resources to help her identify adult day cares in the area as well as private duty nursing services that are reliable and trustworthy  . Collaborated with embedded [place] regarding resources requested by spouse . Discussed plans with patient for ongoing care management follow up and provided patient with direct contact information for care management team  Patient Self Care Activities:  . Calls provider office for new concerns or questions . Unable to self administer medications as prescribed . Lacks social connections . Unable to perform ADLs independently . Unable to perform IADLs independently  Please see past updates related to this goal by clicking on the "Past Updates" button in the selected goal          Follow Up Plan: SW will follow up with patient by phone over the next month as previously planned   Daneen Schick, [place], CDP Social Worker, Certified Dementia Practitioner [place] [facility] [place] [facility] [phone]

## 2020-06-16 NOTE — Chronic Care Management (AMB) (Signed)
Chronic Care Management    Social Work Follow Up Note  06/16/2020 Name: Dylan [name]. MRN: [id number] DOB: [birth year]  [name] is a 79 y.o. year old male who is a primary care patient of [name], MD. The CCM team was consulted for assistance with care coordination.   Review of patient status, including review of consultants reports, other relevant assessments, and collaboration with appropriate care team members and the patient's provider was performed as part of comprehensive patient evaluation and provision of chronic care management services.    SDOH (Social Determinants of Health) assessments performed: No    Outpatient Encounter Medications as of 06/16/2020  Medication Sig Note  . acetaminophen (TYLENOL) 325 MG tablet Take 2 tablets (650 mg total) by mouth every 6 (six) hours as needed for mild pain (or Fever >/= 101). (Patient not taking: Reported on 06/03/2020)   . Ascorbic Acid (VITAMIN C) 1000 MG tablet Take 1,000 mg by mouth every morning.    . carbidopa-levodopa (SINEMET IR) 25-100 MG tablet Take 1.5 tablets by mouth 3 (three) times daily. (Patient taking differently: Take 1.5 tablets by mouth See admin instructions. Take 1 1/2  tablet by mouth three times daily - 10am, 5pm and 10pm)   . carvedilol (COREG) 25 MG tablet Take 0.5 tablets (12.5 mg total) by mouth 2 (two) times daily.   . Cholecalciferol (VITAMIN D3) 5000 units CAPS Take 5,000 Units by mouth daily with supper.    . COLCRYS 0.6 MG tablet Take 1 tablet (0.6 mg total) by mouth daily. (Patient not taking: Reported on 06/03/2020)   . diclofenac Sodium (VOLTAREN) 1 % GEL Apply 2 g topically 3 (three) times daily.   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 Lancet Devices (ONE TOUCH DELICA LANCING DEV) MISC Please fill ONE TOUCH DELICA LANCING DEVICE.  Use to test blood sugar twice daily as directed. E11.65   . Lancets MISC Please fill  basic/generic push button lancets.  Patient to test blood sugar twice daily. DX: E11.65   . Melatonin 10 MG TABS Take 10 mg by mouth at bedtime.    . memantine (NAMENDA) 10 MG tablet Take 1 tablet (10 mg total) by mouth 2 (two) times daily.   . metFORMIN (GLUCOPHAGE) 1000 MG tablet TAKE 1 TABLET BY MOUTH EVERY DAY (Patient taking differently: Take 1,000 mg by mouth every morning. )   . pravastatin (PRAVACHOL) 20 MG tablet TAKE 1 TABLET BY ORAL ROUTE EVERY DAY   . rivastigmine (EXELON) 4.6 mg/24hr Place 1 patch (4.6 mg total) onto the skin daily. (Patient taking differently: Place 4.6 mg onto the skin every evening. ) 04/27/2020: Current patch is on back of right shoulder  . TRULICITY 1.5 RJ/1.8AC SOPN INJECT 1.5 MG INTO THE SKIN ONCE A WEEK. (Patient taking differently: Inject 1.5 mg into the skin every Monday. )    No facility-administered encounter medications on file as of 06/16/2020.     Goals Addressed            This Visit's Progress   . "I would like some resources for adult daycare's in the area and private duty nursing services"       [name] (see longtitudinal plan of care for additional care plan information)  Current Barriers:  [name] Knowledge Deficits related to education and resource information on locating an adult daycare and or reliable trustworthy private duty nursing services . Chronic Disease  Management support and education needs related to Type II DM, CKD stage III, Essential HTN  Nurse Case Manager Clinical Goal(s):  [name] Over the next 30 days, patient will work with embedded [place] to address needs related to resources for assistance with patient care such as adult daycare and private duty nursing   Goal not met due to inability to maintain patient contact . New 04/22/20 Over the next 45 days the patient will work with SW to become more knowledgeable of caregiver resources available to the patient . New 06/16/20 Over the next 7 days the patient will work  with SW to obtain respite placement   CCM SW Interventions Completed 06/16/20 . Collaboration with [name], [name] who reports the patient's spouse has contacted the practice to request an updated Fl2 for respite placement . Successful outbound call placed to [name] who indicates patient will be placed at [facility] in [place] on Friday August 20 . Completed top portion of Fl2 form . Collaboration with [name] to request Fl2 form be reviewed and signed upon adding updated medication list . Collaboration with [name], [place] who will fax form to [facility]  . Collaboration with RN Care Manager, [name], to inform of plan for placement  Patient Self Care Activities:  . Calls provider office for new concerns or questions . Unable to self administer medications as prescribed . Lacks social connections . Unable to perform ADLs independently . Unable to perform IADLs independently  Please see past updates related to this goal by clicking on the "Past Updates" button in the selected goal          Follow Up Plan: SW will follow up with patient by phone over the next 3 weeks as previously planned   Daneen Schick, [place], CDP Social Worker, Certified Dementia Practitioner [facility] [phone]  Total time spent performing care coordination and/or care management activities with the patient by phone or face to face = 25 minutes.

## 2020-06-17 ENCOUNTER — Other Ambulatory Visit: Payer: Self-pay

## 2020-06-17 MED ORDER — DESONIDE 0.05 % EX LOTN: TOPICAL_LOTION | CUTANEOUS | 1 refills | Status: DC

## 2020-06-17 NOTE — Telephone Encounter (Signed)
The patient's meds were verified with the pt's spouse [name].

## 2020-06-21 ENCOUNTER — Telehealth: Payer: Self-pay | Admitting: Cardiology

## 2020-06-22 ENCOUNTER — Other Ambulatory Visit: Payer: Medicare HMO | Admitting: Nurse Practitioner

## 2020-06-22 ENCOUNTER — Other Ambulatory Visit: Payer: Self-pay

## 2020-06-22 DIAGNOSIS — F0391 Unspecified dementia with behavioral disturbance: Secondary | ICD-10-CM

## 2020-06-22 DIAGNOSIS — Z515 Encounter for palliative care: Secondary | ICD-10-CM

## 2020-06-22 NOTE — Progress Notes (Signed)
[facility] Consult Note Telephone: [phone]  Fax: [phone]  PATIENT NAME: Dylan [name]. DOB: July 29, 1941 MRN: [id number]  PRIMARY CARE PROVIDER:   [name], MD  REFERRING PROVIDER:  [name], MD [address] [place],  [place] [address]   I was asked by Dr Baird Cancer to see Dylan [name] for Palliative care consult for goals of care  RESPONSIBLE PARTY:  Self; wife [name] RECOMMENDATIONS and PLAN:  1. ACP: DNR and MOST form completed, placed in [place]. Wishes are for Limited interventions, no feeding tube but wishes are for antibiotic, IVF   2. Palliative care encounter; Palliative medicine team will continue to support patient, patient's family, and medical team. Visit consisted of counseling and education dealing with the complex and emotionally intense issues of symptom management and palliative care in the setting of serious and potentially life-threatening illness  3. F/u visit 2 months if needed or sooner if declines for monitoring for decline, weight with Parkinson, dementia  I spent 90 minutes providing this consultation,  from 11:00am  to 12:30pm. More than 50% of the time in this consultation was spent coordinating communication.   HISTORY OF PRESENT ILLNESS:  Dylan [name]. is a 79 y.o. year old male with multiple medical problems including Parkinson disease, dementia, Aortic stenosis, complete heart block s/p pacemaker, peripheral vascular disease, hypertension, diabetes, neuropathy, chronic kidney disease, gout, vitamin b deficiency,  vitamin D deficiency, anxiety, lipoma sgy, kyphoplasty, eye sgy. 04/27/2020 to 05/11/2020 fall secondary orthostatic hypotension, dehydration. Workup AKI on CKD IIIa likely dehydration, elevated ck secondary to fall. Dementia continue namenda and rivastigmine. Dylan [name] went to [facility] for STR then discharged home with his wife. Initial palliative care  visit with Dylan [name] and his wife Dylan [name]. We talked about purpose of palliative care visit. We talked about past medical history in the setting of chronic disease. We talked about last hospitalization with this charge to [facility] then home. Since Dylan [name] went to respite this past week at [facility], Dylan [name] endorses she had a baby shower to attend to in another location. We talked about Dylan [name] current functional ability where he is able to ambulate with his walker though Dylan [name] endorses she is always present as he is a high fall risk. We talked about adl's. We talked about his ability to bathe and dress, toilet. We talked about his appetite which is good with no noted weight loss. 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 Dylan [name] talks about his daily routine. We talked about the action of the PACE program. Dylan [name] endorses that she was starting to inquire about the PACE program but could not locate the number. Number and contact information provided. We talked about respite options such as what [facility] offered. We talked about symptoms of pain and shortness of breath which he currently is asymptomatic. We talked about medical goals of care including aggressive versus conservative versus comfort care. We talked about code status full code vs. DNR at length. And discussion resulted in which has to be DNR and [place] form completed in addition to completed the MOST form. The MOST form was completed as DNR, limited Intervention, no feeding tube, wishes are for antibiotics, IV Therapy, transport to the hospital. We talked about challenges with caring for someone, caregiver fatigue, coping strategies. We talked about role of palliative care and plan of care. We talked  about follow up visit in 2 months if needed or sooner should he decline. Dylan [name] in agreement,  appointment scheduled. Therapeutic listening and  emotional support provided. Contact information. Questions answered to satisfaction. Palliative Care was asked to help address goals of care.   CODE STATUS: DNR  PPS: 40% HOSPICE ELIGIBILITY/DIAGNOSIS: TBD  PAST MEDICAL HISTORY:  Past Medical History:  Diagnosis Date  . Anxiety   . Aortic stenosis    mild AS 11/24/16 (peak grad 24, mean grad 11) Dr. [name]  . CHB (complete heart block) ([place]) 07/2017  . 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 Diabetes mellitus without complication ([place])   . Gait abnormality   . Gout   . Hyperlipemia   . Hypertension   . 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 Memory loss   . Peripheral arterial disease ([place])   . 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 Second degree AV block    Wenckebach; no indication for pacemaker as of 12/01/16 (Dr. [name])  . Vitamin B12 deficiency anemia 03/01/2018  . Vitamin D deficiency disease     SOCIAL HX:  Social History   Tobacco Use  . Smoking status: Former Smoker    Packs/day: 0.50    Years: 7.00    Pack years: 3.50  . Smokeless tobacco: Never Used  . Tobacco comment: quit 35 years  Substance Use Topics  . Alcohol use: No    ALLERGIES: No Known Allergies   PERTINENT MEDICATIONS:  Outpatient Encounter Medications as of 06/22/2020  Medication Sig  . Ascorbic Acid (VITAMIN C) 1000 MG tablet Take 1,000 mg by mouth every morning.   . carbidopa-levodopa (SINEMET IR) 25-100 MG tablet Take 1.5 tablets by mouth 3 (three) times daily. (Patient taking differently: Take 1.5 tablets by mouth See admin instructions. Take 1 1/2  tablet by mouth three times daily - 10am, 5pm and 10pm)  . carvedilol (COREG) 25 MG tablet Take 0.5 tablets (12.5 mg total) by mouth 2 (two) times daily.  . Cholecalciferol (VITAMIN D3) 5000 units CAPS Take 5,000 Units by mouth daily with supper.   . COLCRYS 0.6 MG tablet Take 1 tablet (0.6 mg total) by mouth daily. (Patient not taking: Reported on 06/03/2020)   . desonide (DESOWEN) 0.05 % lotion Apply topically 2 times per day prn  . diclofenac Sodium (VOLTAREN) 1 % GEL Apply 2 g topically 3 (three) times daily.  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 Lancet Devices (ONE TOUCH DELICA LANCING DEV) MISC Please fill ONE TOUCH DELICA LANCING DEVICE.  Use to test blood sugar twice daily as directed. E11.65  . Lancets MISC Please fill basic/generic push button lancets.  Patient to test blood sugar twice daily. DX: E11.65  . Melatonin 10 MG TABS Take 10 mg by mouth at bedtime.   . memantine (NAMENDA) 10 MG tablet Take 1 tablet (10 mg total) by mouth 2 (two) times daily.  . metFORMIN (GLUCOPHAGE) 1000 MG tablet TAKE 1 TABLET BY MOUTH EVERY DAY (Patient taking differently: Take 1,000 mg by mouth every morning. )  . pravastatin (PRAVACHOL) 20 MG tablet TAKE 1 TABLET BY ORAL ROUTE EVERY DAY  . rivastigmine (EXELON) 4.6 mg/24hr Place 1 patch (4.6 mg total) onto the skin daily. (Patient taking differently: Place 4.6 mg onto the skin every evening. )  . TRULICITY 1.5 HE/5.2DP SOPN INJECT 1.5 MG INTO THE SKIN ONCE A WEEK. (  Patient taking differently: Inject 1.5 mg into the skin every Monday. )   No facility-administered encounter medications on file as of 06/22/2020.    PHYSICAL EXAM:   General: NAD,debilitated, pleasant male Cardiovascular: regular rate and rhythm Pulmonary: clear ant fields Neurological: generalized weakness  Adyson Vanburen Ihor Gully, NP

## 2020-06-30 NOTE — Telephone Encounter (Signed)
Called patient, NA, LMAM to call back about pacemaker.

## 2020-07-01 ENCOUNTER — Ambulatory Visit: Payer: Medicare HMO

## 2020-07-01 DIAGNOSIS — G20 Parkinson's disease: Secondary | ICD-10-CM

## 2020-07-01 DIAGNOSIS — N1831 Chronic kidney disease, stage 3a: Secondary | ICD-10-CM

## 2020-07-01 DIAGNOSIS — N183 Chronic kidney disease, stage 3 unspecified: Secondary | ICD-10-CM

## 2020-07-01 DIAGNOSIS — F0391 Unspecified dementia with behavioral disturbance: Secondary | ICD-10-CM

## 2020-07-01 NOTE — Chronic Care Management (AMB) (Signed)
Chronic Care Management    Social Work Follow Up Note  07/01/2020 Name: Dylan [name]. MRN: [id number] DOB: [birth year]  Dylan [name]. is a 79 y.o. year old male who is a primary care patient of [name], MD. The CCM team was consulted for assistance with care coordination.   Review of patient status, including review of consultants reports, other relevant assessments, and collaboration with appropriate care team members and the patient's provider was performed as part of comprehensive patient evaluation and provision of chronic care management services.    SDOH (Social Determinants of Health) assessments performed: No    Outpatient Encounter Medications as of 07/01/2020  Medication Sig Note   Ascorbic Acid (VITAMIN C) 1000 MG tablet Take 1,000 mg by mouth every morning.     carbidopa-levodopa (SINEMET IR) 25-100 MG tablet Take 1.5 tablets by mouth 3 (three) times daily. (Patient taking differently: Take 1.5 tablets by mouth See admin instructions. Take 1 1/2  tablet by mouth three times daily - 10am, 5pm and 10pm)    carvedilol (COREG) 25 MG tablet Take 0.5 tablets (12.5 mg total) by mouth 2 (two) times daily.    Cholecalciferol (VITAMIN D3) 5000 units CAPS Take 5,000 Units by mouth daily with supper.     COLCRYS 0.6 MG tablet Take 1 tablet (0.6 mg total) by mouth daily. (Patient not taking: Reported on 06/03/2020)    desonide (DESOWEN) 0.05 % lotion Apply topically 2 times per day prn    diclofenac Sodium (VOLTAREN) 1 % GEL Apply 2 g topically 3 (three) times daily.    glucose blood (ONETOUCH VERIO) test strip Use as instructed to check blood sugars daily dx: e11.22    Lancet Devices (ONE TOUCH DELICA LANCING DEV) MISC Please fill ONE TOUCH DELICA LANCING DEVICE.  Use to test blood sugar twice daily as directed. E11.65    Lancets MISC Please fill basic/generic push button lancets.  Patient to test blood sugar twice daily. DX: E11.65    Melatonin 10 MG TABS Take 10 mg by  mouth at bedtime.     memantine (NAMENDA) 10 MG tablet Take 1 tablet (10 mg total) by mouth 2 (two) times daily.    metFORMIN (GLUCOPHAGE) 1000 MG tablet TAKE 1 TABLET BY MOUTH EVERY DAY (Patient taking differently: Take 1,000 mg by mouth every morning. )    pravastatin (PRAVACHOL) 20 MG tablet TAKE 1 TABLET BY ORAL ROUTE EVERY DAY    rivastigmine (EXELON) 4.6 mg/24hr Place 1 patch (4.6 mg total) onto the skin daily. (Patient taking differently: Place 4.6 mg onto the skin every evening. ) 04/27/2020: Current patch is on back of right shoulder   TRULICITY 1.5 UX/3.2GM SOPN INJECT 1.5 MG INTO THE SKIN ONCE A WEEK. (Patient taking differently: Inject 1.5 mg into the skin every Monday. )    No facility-administered encounter medications on file as of 07/01/2020.     Goals Addressed            This Visit's Progress    COMPLETED: "I would like some resources for adult daycare's in the area and private duty nursing services"       [place] (see longtitudinal plan of care for additional care plan information)  Current Barriers:   Knowledge Deficits related to education and resource information on locating an adult daycare and or reliable trustworthy private duty nursing services  Chronic Disease Management support and education needs related to Type II DM, CKD stage III, Essential HTN  Nurse Case Manager  Clinical Goal(s):   Over the next 30 days, patient will work with embedded BSW Daneen Schick to address needs related to resources for assistance with patient care such as adult daycare and private duty nursing   Goal not met due to inability to maintain patient contact  New 04/22/20 Over the next 45 days the patient will work with SW to become more knowledgeable of caregiver resources available to the patient  New 06/16/20 Over the next 7 days the patient will work with SW to obtain respite placement   CCM SW Interventions Completed 07/01/20 with [name]  Successful  outbound call placed to the patients caregiver, [name] to assess goal progression  Determined the patient had a successful respite stay on August 20 and has returned to [facility] as of today's date for another respite stay  Discussed Mrs. Dylan [name] plans to use [facility] for respite care needs  Goal Met  Completed 06/16/20  Collaboration with [name], [name] who reports the patient's spouse has contacted the practice to request an updated Fl2 for respite placement  Successful outbound call placed to Mrs. Dylan [name] who indicates patient will be placed at [facility] in [place] on Friday August 20  Completed top portion of Fl2 form  Collaboration with [name] to request Fl2 form be reviewed and signed upon adding updated medication list  Collaboration with [name], [place] who will fax form to [facility]   Collaboration with RN Care Manager, [name], to inform of plan for placement  Completed 06/02/20 with [place] with RN Care Manager regarding patient care coordination needs post-discharge from SNF  Successful outbound call placed to Mrs. Dylan [name] to assess for care coordination needs  Determined Mrs. Dylan [name] is interested in programs to provide interaction for the patient  Discussed planned home health SW home visit scheduled for 8/4  Advised Mrs. Dylan [name] to contact embedded SW directly if resources are still needed following planned home visit  Scheduled follow up call over the next month to assess goal progression  Completed 04/22/20 with [place] with RN Care Manager who indicated patient caregiver interest in respite resources to assist with occasional overnight care needs  Communication with [name] to assess caregiver respite needs  Determined Mrs. Dylan [name] owns a home in [place] and is interested in resources to assist with patient care needs  on weekends should she go out of town to [place] o Advised Mrs. Dylan [name] there are minimal options to assist with weekend care outside of the home o Provided education surrounding local adult day program assisting with care needs M-F during regular business hours o Reviewed opportunity for respite placement in a facility but requiring a minimum stay; Mrs. Dylan [name] reports interest for just 2-3 days at a time o Advised Mrs. Dylan [name] the best option would be in home care provider by an in home aide company o Mailed a list of in home aide providers that service patients county  Discussed Mrs. Dylan [name] continues to have plans to go on a trip with her grand-daughter in Spring of 2022 when her grand-daughter graduates o Mrs. Dylan [name] reports she has contacted an ALF which is affordable and close to home but they require a 14 day minimum respite state and she will only need a 10 day respite o Advised Mrs. Dylan [name] most ALF do require a 14 day minimum stay o Attempted to provide a list of ALF communities within [place] for Mrs. Dylan [name]  to contact; Mrs. Dylan [name] declined stating she was okay with the 14 day stay as she felt this community was very affordable  Scheduled follow up call over the next 4 weeks to confirm receipt of mailed resource  Collaboration with RN Care Manager regarding plan for caregiver resources  Completed 02/07/20 with [name]  Successful outbound call to [name] to assist with resource education related to patient care  Discussed opportunity to enroll in an adult day program  o Informed by [name], the patient is not interested in or willing to attend an adult day program  Determined [name] is interested in respite placement "next year" for about 7 days when [place] plans to take her grand-daughter on a trip post graduation o [name] a respite stay would be offerred through assisted livings and would be sought when closer to date of placement needs o Discussed concerns of [name]  wanting to know pricing now so she may save up funds - [name] reports she has contacted several ALF in [place] for pricing but wanted SW help regarding which place is "good" - [name], SW was unable to help plan a respite stay over a year in advance  Reviewed concern of patient being left alone when [name] goes out of town for weekend trips o Discussed opportunity to enroll patient in "Caregiver on call" program offered through [employer] o Provided information via mail  Determined the patient has experienced a fall this week but without reported injury o Collaboration with Consulting civil engineer to inform of recent fall  Discussed importance of keeping patient active and engaged both physically and cognitively o Provided education on local programs offered by [employer] [facility] [employer]  o Discussed opportunity to attend "Connections" group respite program offered by [employer] o Mailed information to the patient and [name] for review  Scheduled follow up call over the next month to confirm receipt of mailing and assist with care coordination needs  CCM RN CM Interventions:  01/30/20 call completed with spouse [name]   Determined spouse would like resources to help her identify adult day cares in the area as well as private duty nursing services that are reliable and trustworthy   Collaborated with embedded BSW Daneen Schick regarding resources requested by spouse  Discussed plans with patient for ongoing care management follow up and provided patient with direct contact information for care management team  Patient Self Care Activities:   Calls provider office for new concerns or questions  Unable to self administer medications as prescribed  Lacks social connections  Unable to perform ADLs independently  Unable to perform IADLs independently  Please see past updates related to this goal by clicking on the "Past Updates" button in the selected goal        "we need railings for our porch"       [name] stated  CARE PLAN ENTRY (see longitudinal plan of care for additional care plan information)  Current Barriers:   Financial constraints related to cost of home modification  ADL IADL limitations  Patient lives in an [place] neighborhood and is limited to options for railings allowed  Chronic conditions including DM II, CKD III, Dementia, and Parkinsonism directly impacting patient ability to access stairs safely without a railing  Social Work Clinical Goal(s):   Over the next 30 days the patient and his caregiver will work with SW to identify resources to assist with home modification need  CCM SW Interventions: Completed 07/01/20 with [place] care team  collaboration (see longitudinal plan of care)  Successful outbound call placed to Mrs. Dylan [name] to assist with ongoing care coordination  Determined the patient is in need of hand railing in the front and garage entrances o [name] reports the patient home is in a neighborhood governed by an [place] and railings must be wrought iron  Discussed the patient was seen in the home by a home health SW who informed Mrs. Dylan [name] there was a resource to help with this at no cost o Mrs. Dylan [name] unable to recall home health agency name and has not received more information from home health SW  Advised Mrs. Dylan [name] that most resources that assist with home modifications are based on income o Discussed possible barriers to locating a resource to assist with railings approved by patient [place] with Consulting civil engineer; unable to identify past home health agency involved with the patient  Collaboration with Sharol Given with [employer] to inquire if the agency is able to assist with [place] approved railings  SW to continue to follow  Patient Self Care Activities:   Attends all scheduled provider appointments  Calls pharmacy for medication  refills  Calls provider office for new concerns or questions  Supportive caregiver to assist with patient care needs  Initial goal documentation         Follow Up Plan: SW will follow up with patient by phone over the next week.   Daneen Schick, BSW, CDP Social Worker, Certified Dementia Practitioner [place] [facility] [place] [facility] [phone]  Total time spent performing care coordination and/or care management activities with the patient by phone or face to face = 20 minutes.

## 2020-07-01 NOTE — Patient Instructions (Signed)
Social Worker Visit Information  Goals we discussed today:  Goals Addressed            This Visit's Progress   . COMPLETED: "I would like some resources for adult daycare's in the area and private duty nursing services"       [place] (see longtitudinal plan of care for additional care plan information)  Current Barriers:  [name] Knowledge Deficits related to education and resource information on locating an adult daycare and or reliable trustworthy private duty nursing services . Chronic Disease Management support and education needs related to Type II DM, CKD stage III, Essential HTN  Nurse Case Manager Clinical Goal(s):  [name] Over the next 30 days, patient will work with embedded [name] to address needs related to resources for assistance with patient care such as adult daycare and private duty nursing   Goal not met due to inability to maintain patient contact . New 04/22/20 Over the next 45 days the patient will work with SW to become more knowledgeable of caregiver resources available to the patient . New 06/16/20 Over the next 7 days the patient will work with SW to obtain respite placement   CCM SW Interventions Completed 07/01/20 with Dylan [name] . Successful outbound call placed to the patients caregiver, Dylan [name] to assess goal progression . Determined the patient had a successful respite stay on August 20 and has returned to [facility] as of today's date for another respite stay . Discussed Mrs. Dylan [name] plans to use [facility] for respite care needs . Goal Met  Completed 06/16/20 . Collaboration with Dylan [name], [name] who reports the patient's spouse has contacted the practice to request an updated Fl2 for respite placement . Successful outbound call placed to Mrs. Dylan [name] who indicates patient will be placed at [name] on Friday August 20 . Completed top portion of Fl2  form . Collaboration with [name] to request Fl2 form be reviewed and signed upon adding updated medication list . Collaboration with Dylan [name], [name] who will fax form to [facility]  . Collaboration with RN Care Manager, Dylan [name], to inform of plan for placement  Completed 06/02/20 with [name] with RN Care Manager regarding patient care coordination needs post-discharge from SNF . Successful outbound call placed to Mrs. Dylan [name] to assess for care coordination needs . Determined Mrs. Dylan [name] is interested in programs to provide interaction for the patient . Discussed planned home health SW home visit scheduled for 8/4 . Advised Mrs. Dylan [name] to contact embedded SW directly if resources are still needed following planned home visit . Scheduled follow up call over the next month to assess goal progression  Completed 04/22/20 with Dylan [name] with [name] who indicated patient caregiver interest in respite resources to assist with occasional overnight care needs . Communication with Dylan [name] to assess caregiver respite needs . Determined Mrs. Dylan [name] owns a home in [place] and is interested in resources to assist with patient care needs on weekends should she go out of town to [place] o Advised Mrs. Dylan [name] there are minimal options to assist with weekend care outside of the home o Provided education surrounding local adult day program assisting with care needs M-F during regular business hours o Reviewed opportunity for respite placement in a facility but requiring a minimum stay; Mrs. Dylan [name] reports interest for just 2-3 days at a time o Advised Mrs. Dylan [name] the  best option would be in home care provider by an in home aide company o Mailed a list of in home aide providers that service patients county . Discussed Mrs. Dylan [name] continues to have plans to go on a trip with her grand-daughter in Spring of 2022 when her  grand-daughter graduates o Mrs. Dylan [name] reports she has contacted an ALF which is affordable and close to home but they require a 14 day minimum respite state and she will only need a 10 day respite o Advised Mrs. Dylan [name] most ALF do require a 14 day minimum stay o Attempted to provide a list of ALF communities within [place] for Mrs. Dylan [name] to contact; Mrs. Dylan [name] declined stating she was okay with the 14 day stay as she felt this community was very affordable . Scheduled follow up call over the next 4 weeks to confirm receipt of mailed resource . Collaboration with RN Care Manager regarding plan for caregiver resources  Completed 02/07/20 with Dylan [name] . Successful outbound call to Dylan [name] to assist with resource education related to patient care . Discussed opportunity to enroll in an adult day program  o Informed by Dylan [name], the patient is not interested in or willing to attend an adult day program . Determined Dylan [name] is interested in respite placement "next year" for about 7 days when [place] plans to take her grand-daughter on a trip post graduation o Dylan [name] a respite stay would be offerred through assisted livings and would be sought when closer to date of placement needs o Discussed concerns of Dylan [name] wanting to know pricing now so she may save up funds - Dylan [name] reports she has contacted several ALF in [place] for pricing but wanted SW help regarding which place is "good" - Dylan [name], SW was unable to help plan a respite stay over a year in advance . Reviewed concern of patient being left alone when Dylan [name] goes out of town for weekend trips o Discussed opportunity to enroll patient in "Caregiver on call" program offered through [employer] o Provided information via mail . Determined the patient has experienced a fall this week but without reported injury o Collaboration with RN Care Manager to inform of recent fall . Discussed importance of keeping patient active  and engaged both physically and cognitively o Provided education on local programs offered by [place]  o Discussed opportunity to attend "Connections" group respite program offered by [employer] o Mailed information to the patient and Dylan [name] for review . Scheduled follow up call over the next month to confirm receipt of mailing and assist with care coordination needs  CCM RN CM Interventions:  01/30/20 call completed with spouse Dylan [name]  . Determined spouse would like resources to help her identify adult day cares in the area as well as private duty nursing services that are reliable and trustworthy  . Collaborated with embedded [place] regarding resources requested by spouse . Discussed plans with patient for ongoing care management follow up and provided patient with direct contact information for care management team  Patient Self Care Activities:  . Calls provider office for new concerns or questions . Unable to self administer medications as prescribed . Lacks social connections . Unable to perform ADLs independently . Unable to perform IADLs independently  Please see past updates related to this goal by clicking on the "Past Updates" button in the selected goal      . "we need railings for our porch"       Dylan [name]  stated  CARE PLAN ENTRY (see longitudinal plan of care for additional care plan information)  Current Barriers:  . Financial constraints related to cost of home modification . ADL IADL limitations . Patient [place] an [place] neighborhood and is limited to options for railings allowed . Chronic conditions including DM II, CKD III, Dementia, and Parkinsonism directly impacting patient ability to access stairs safely without a railing  Social Work Clinical Goal(s):  [name] Over the next 30 days the patient and his caregiver will work with SW to identify resources to assist with home modification need  CCM SW Interventions: Completed  07/01/20 with Dylan [name] . Inter-disciplinary care team collaboration (see longitudinal plan of care) . Successful outbound call placed to Mrs. Dylan [name] to assist with ongoing care coordination . Determined the patient is in need of hand railing in the front and garage entrances o Dylan [name] reports the patient home is in a neighborhood governed by an [place] and railings must be wrought iron . Discussed the patient was seen in the home by a home health SW who informed Mrs. Dylan [name] there was a resource to help with this at no cost o Mrs. Dylan [name] unable to recall home health agency name and has not received more information from home health SW . Advised Mrs. Dylan [name] that most resources that assist with home modifications are based on income o Discussed possible barriers to locating a resource to assist with railings approved by patient HOA . Collaboration with Consulting civil engineer; unable to identify past home health agency involved with the patient . Collaboration with Sharol Given with [employer] to inquire if the agency is able to assist with [place] approved railings . SW to continue to follow  Patient Self Care Activities:  . Attends all scheduled provider appointments . Calls pharmacy for medication refills . Calls provider office for new concerns or questions . Supportive caregiver to assist with patient care needs  Initial goal documentation         Materials Provided: Verbal education about home modification resources provided by phone  Follow Up Plan: SW will follow up with patient by phone over the next week.   Dylan [name], BSW, CDP Social Worker, Certified Dementia Practitioner [place] [facility] [place] [facility] [phone]

## 2020-07-04 ENCOUNTER — Other Ambulatory Visit: Payer: Self-pay | Admitting: Internal Medicine

## 2020-07-04 DIAGNOSIS — I10 Essential (primary) hypertension: Secondary | ICD-10-CM

## 2020-07-09 ENCOUNTER — Telehealth: Payer: Self-pay

## 2020-07-09 ENCOUNTER — Telehealth: Payer: Medicare HMO

## 2020-07-09 NOTE — Telephone Encounter (Signed)
  Chronic Care Management   Outreach Note  07/09/2020 Name: Dylan [name]. MRN: [id number] DOB: 1941-02-20  Referred by: [name], MD Reason for referral : Care Coordination   An unsuccessful telephone outreach was attempted today. The patient was referred to the case management team for assistance with care management and care coordination.   Follow Up Plan: A HIPPA compliant phone message was left for the patient providing contact information and requesting a return call.  The care management team will reach out to the patient again over the next 10 days.   Daneen Schick, BSW, CDP Social Worker, Certified Dementia Practitioner [place] [facility] [place] [facility] [phone]

## 2020-07-10 ENCOUNTER — Other Ambulatory Visit: Payer: Self-pay

## 2020-07-10 ENCOUNTER — Encounter: Payer: Self-pay | Admitting: Cardiology

## 2020-07-10 ENCOUNTER — Ambulatory Visit: Payer: Medicare HMO | Admitting: Cardiology

## 2020-07-10 VITALS — BP 134/67 | HR 68 | Resp 16 | Ht 71.0 in | Wt 168.0 lb

## 2020-07-10 DIAGNOSIS — Z950 Presence of cardiac pacemaker: Secondary | ICD-10-CM

## 2020-07-10 DIAGNOSIS — G903 Multi-system degeneration of the autonomic nervous system: Secondary | ICD-10-CM

## 2020-07-10 DIAGNOSIS — I10 Essential (primary) hypertension: Secondary | ICD-10-CM

## 2020-07-10 DIAGNOSIS — Z45018 Encounter for adjustment and management of other part of cardiac pacemaker: Secondary | ICD-10-CM

## 2020-07-10 NOTE — Progress Notes (Signed)
Primary Physician/Referring:  [name], MD  Patient ID: Dylan [name]., male    DOB: January 06, 1941, 79 y.o.   MRN: [id number]  Chief Complaint  Patient presents with  . Orthostatic Hypotension  . Supine Hypertention  . Follow-up    4 week   HPI:    Brooke Payes.  is a 79 y.o. African-American male with complete heart block SP Saint Jude pacemaker implantation on 07/11/2020, hypertension, hyperlipidemia, severe dementia and Parkinson's disease who I had seen 3 years ago, he was lost to follow-up after pacemaker implantation.  He was admitted to [facility] on 04/27/2020 with syncope, severe orthostatic hypotension, acute kidney injury with elevated CK enzymes from a fall.  After diuresis, holding his blood pressure medications, serum creatinine improved, was discharged home and recommended outpatient evaluation.  He is accompanied by his wife today.  Patient is very slow to react and speaks very few words.  Denies any specific symptoms or complaints.  Wife gives most of the history.  On his last office visit due to severe orthostatic hypotension, I had reduced the dose of his antihypertensive medications.  Patient himself has noticed much more alert this and also he has not had any further fall.  Past Medical History:  Diagnosis Date  . Anxiety   . Aortic stenosis    mild AS 11/24/16 (peak grad 24, mean grad 11) Dr. [name]  . CHB (complete heart block) ([place]) 07/2017  . 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[place]) 07/11/2017  . Diabetes mellitus without complication ([place] )   . Encounter for care of pacemaker 07/11/2020  . Gait abnormality   . Gout   . Hyperlipemia   . Hypertension   . 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 Memory loss   . Pacemaker Dual chamber [place] MRI  model [id number] 07/11/2020 06/08/2020  . Peripheral arterial disease ([place])   . 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 Second  degree AV block    Wenckebach; no indication for pacemaker as of 12/01/16 (Dr. [name])  . Vitamin B12 deficiency anemia 03/01/2018  . Vitamin D deficiency disease    Past Surgical History:  Procedure Laterality Date  . CARDIOVASCULAR STRESS TEST     11/21/16 Low risk study, EF 52% [facility])  . EYE SURGERY    . KYPHOPLASTY N/A 02/15/2017   Procedure: LUMBAR FOUR KYPHOPLASTY;  Surgeon: Phylliss Bob, MD;  Location: [facility];  Service: Orthopedics;  Laterality: N/A;  . lipoma surgery     neck - 30 years ago  . PACEMAKER IMPLANT N/A 07/11/2017   Procedure: Pacemaker Implant;  Surgeon: Constance Haw, MD;  Location: [facility];  Service: Cardiovascular;  Laterality: N/A;  . TRANSTHORACIC ECHOCARDIOGRAM     11/24/16 [facility]): EF 55-60%, mild AS, mild-mod MR, mod TR, moderate pulm HTN, PAP 49 mmHg   Family History  Problem Relation Age of Onset  . Diabetes Mother   . Breast cancer Mother   . Arthritis Mother   . Hypertension Father   . Diabetes Sister   . Diabetes Brother   . Diabetes Maternal Grandmother   . Diabetes Brother   . Diabetes Brother   . Diabetes Brother   . Diabetes Sister   . Diabetes Sister   . Diabetes Sister     Social History   Tobacco Use  . Smoking status: Former Smoker    Packs/day: 0.50  Years: 7.00    Pack years: 3.50    Types: Cigarettes  . Smokeless tobacco: Never Used  . Tobacco comment: quit 35 years  Substance Use Topics  . Alcohol use: No   Marital Status: Married  ROS  Review of Systems  Constitutional: Positive for malaise/fatigue.  Neurological: Positive for disturbances in coordination, dizziness, loss of balance and tremors.  Psychiatric/Behavioral: Positive for memory loss.   Objective  Blood pressure 134/67, pulse 68, resp. rate 16, height 5\' 11"  (1.803 m), weight 168 lb (76.2 kg), SpO2 97 %.  Vitals with BMI 07/10/2020 06/08/2020 05/11/2020  Height 5\' 11"  5\' 11"  -  Weight 168 lbs 170 lbs -  BMI 77.82  42.35 -  Systolic 361 96 443  Diastolic 67 48 66  Pulse 68 69 60    Orthostatic VS for the past 72 hrs (Last 3 readings):  Orthostatic BP Patient Position BP Location Cuff Size Orthostatic Pulse  07/10/20 1253 136/66 Sitting Left Arm Large --  07/10/20 1251 145/73 Standing Left Arm Normal 73  07/10/20 1152 -- Sitting Left Arm Normal --      Physical Exam Constitutional:      General: He is not in acute distress. Cardiovascular:     Rate and Rhythm: Normal rate and regular rhythm.     Pulses: Intact distal pulses.     Heart sounds: Heart sounds are distant. No murmur heard.  No gallop.      Comments: No leg edema, no JVD. Pulmonary:     Effort: Pulmonary effort is normal.     Breath sounds: Normal breath sounds.  Abdominal:     General: Bowel sounds are normal.     Palpations: Abdomen is soft.  Neurological:     Mental Status: He is alert.     Motor: Weakness present.     Coordination: Coordination abnormal.     Gait: Gait abnormal.    Laboratory examination:   Recent Labs    05/01/20 0427 05/01/20 0427 05/04/20 0423 05/06/20 0809 05/11/20 0430  NA 140  --  138 135  --   K 3.3*  --  3.5 3.5  --   CL 103  --  105 101  --   CO2 26  --  26 24  --   GLUCOSE 112*  --  99 213*  --   BUN 23  --  20 16  --   CREATININE 1.30*   < > 1.13 1.12 1.17  CALCIUM 9.5  --  9.2 9.1  --   GFRNONAA 52*   < > >60 >60 59*  GFRAA >60   < > >60 >60 >60   < > = values in this interval not displayed.   CrCl cannot be calculated (Patient's most recent lab result is older than the maximum 21 days allowed.).  CMP Latest Ref Rng & Units 05/11/2020 05/06/2020 05/04/2020  Glucose 70 - 99 mg/dL - 213(H) 99  BUN 8 - 23 mg/dL - 16 20  Creatinine 0.61 - 1.24 mg/dL 1.17 1.12 1.13  Sodium 135 - 145 mmol/L - 135 138  Potassium 3.5 - 5.1 mmol/L - 3.5 3.5  Chloride 98 - 111 mmol/L - 101 105  CO2 22 - 32 mmol/L - 24 26  Calcium 8.9 - 10.3 mg/dL - 9.1 9.2  Total Protein 6.5 - 8.1 g/dL - - -  Total  Bilirubin 0.3 - 1.2 mg/dL - - -  Alkaline Phos 38 - 126 U/L - - -  AST  15 - 41 U/L - - -  ALT 0 - 44 U/L - - -   CBC Latest Ref Rng & Units 05/06/2020 04/28/2020 04/27/2020  WBC 4.0 - 10.5 K/uL 4.3 6.8 7.2  Hemoglobin 13.0 - 17.0 g/dL 11.7(L) 11.1(L) 11.7(L)  Hematocrit 39 - 52 % 36.1(L) 34.3(L) 36.1(L)  Platelets 150 - 400 K/uL 203 125(L) 145(L)    Lipid Panel Recent Labs    07/24/19 1228  CHOL 129  TRIG 70  LDLCALC 66  HDL 49  CHOLHDL 2.6    HEMOGLOBIN A1C Lab Results  Component Value Date   HGBA1C 7.0 (H) 06/03/2020   MPG 163 02/09/2017   TSH Recent Labs    04/27/20 1844  TSH 1.679   Medications and allergies  No Known Allergies   Outpatient Medications Prior to Visit  Medication Sig Dispense Refill  . Ascorbic Acid (VITAMIN C) 1000 MG tablet Take 1,000 mg by mouth every morning.     . carbidopa-levodopa (SINEMET IR) 25-100 MG tablet Take 1.5 tablets by mouth 3 (three) times daily. (Patient taking differently: Take 1.5 tablets by mouth See admin instructions. Take 1 1/2  tablet by mouth three times daily - 10am, 5pm and 10pm) 130 tablet 11  . carvedilol (COREG) 12.5 MG tablet TAKE 1 TABLET BY MOUTH 2 TIMES DAILY. 60 tablet 11  . Cholecalciferol (VITAMIN D3) 5000 units CAPS Take 5,000 Units by mouth daily with supper.     . COLCRYS 0.6 MG tablet Take 1 tablet (0.6 mg total) by mouth daily. (Patient taking differently: Take 0.6 mg by mouth daily as needed. ) 90 tablet 1  . desonide (DESOWEN) 0.05 % lotion Apply topically 2 times per day prn 59 mL 1  . diclofenac Sodium (VOLTAREN) 1 % GEL Apply 2 g topically 3 (three) times daily. (Patient taking differently: Apply 2 g topically as needed. ) 150 g 0  . glucose blood (ONETOUCH VERIO) test strip Use as instructed to check blood sugars daily dx: e11.22 150 each 3  . Lancet Devices (ONE TOUCH DELICA LANCING DEV) MISC Please fill ONE TOUCH DELICA LANCING DEVICE.  Use to test blood sugar twice daily as directed. E11.65 1 each 1   . Lancets MISC Please fill basic/generic push button lancets.  Patient to test blood sugar twice daily. DX: E11.65 100 each 4  . Melatonin 10 MG TABS Take 10 mg by mouth at bedtime.     . memantine (NAMENDA) 10 MG tablet TAKE 1 TABLET BY MOUTH TWICE A DAY 60 tablet 14  . metFORMIN (GLUCOPHAGE) 1000 MG tablet TAKE 1 TABLET BY MOUTH EVERY DAY (Patient taking differently: Take 1,000 mg by mouth every morning. ) 30 tablet 2  . pravastatin (PRAVACHOL) 20 MG tablet TAKE 1 TABLET BY ORAL ROUTE EVERY DAY 30 tablet 2  . rivastigmine (EXELON) 4.6 mg/24hr Place 1 patch (4.6 mg total) onto the skin daily. (Patient taking differently: Place 4.6 mg onto the skin every evening. ) 30 patch 12  . TRULICITY 1.78 YO/3.7CH SOPN INJECT 1.5 MG INTO THE SKIN ONCE A WEEK. (Patient taking differently: Inject 1.5 mg into the skin every Monday. ) 12 pen 1  . carvedilol (COREG) 25 MG tablet Take 0.5 tablets (12.5 mg total) by mouth 2 (two) times daily. (Patient not taking: Reported on 07/10/2020)     No facility-administered medications prior to visit.   No orders of the defined types were placed in this encounter.  Medications Discontinued During This Encounter  Medication Reason  .  carvedilol (COREG) 25 MG tablet Change in therapy     Radiology:   No results found.  Cardiac Studies:   Echocardiogram 04/28/2020:  1. Left ventricular ejection fraction, by estimation, is 50 to 55%. The  left ventricle has low normal function. The left ventricle has no regional  wall motion abnormalities. Left ventricular diastolic parameters are  consistent with Grade I diastolic  dysfunction (impaired relaxation).  2. Right ventricular systolic function is normal. The right ventricular  size is normal. There is moderately elevated pulmonary artery systolic pressure.  3. The mitral valve is normal in structure. Mild mitral valve  regurgitation.  4. The aortic valve is tricuspid. Aortic valve regurgitation is mild.  Mild to moderate  aortic valve sclerosis/calcification is present, without any evidence of aortic stenosis.  5.  Compared to the study done on 11/24/2016, moderate tricuspid regurgitation and moderate pulmonary hypertension not present, previously mild aortic stenosis reported.  Remote pacemaker check  Dual chamber St Jude Medical [employer] MRI  06/26/2020: 1 high ventricular rate detected, EGMs reveal atrial tachycardia, brief for 8 seconds.  There were 55 AMS episodes and <1% mode switches, EGMs reveal atrial tachycardia, longest 16 seconds.  AT/AF burden <1%. AP 14%, VP >99%. Normal function.   EKG  EKG 06/08/2020: Underlying sinus rhythm with first-degree AV block at rate of 69 bpm, left atrial abnormality, ventricularly paced rhythm.  No further analysis.      Assessment     ICD-10-CM   1. Neurogenic orthostatic hypotension (HCC)  G90.3   2. Essential hypertension  I10   3. Pacemaker [place] MRI  model [id number] 07/11/2020  Z95.0   4. Encounter for care of pacemaker  Z45.018     No orders of the defined types were placed in this encounter.   Medications Discontinued During This Encounter  Medication Reason  . carvedilol (COREG) 25 MG tablet Change in therapy   Recommendations:   Rawson Minix. is a 79 y.o.  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 He was admitted to [facility] on 04/27/2020 with syncope, severe orthostatic hypotension, acute kidney injury with elevated CK enzymes from a fall.  After diuresis, holding his blood pressure medications, serum creatinine improved.  On his last office visit, due to severe hypotension I discontinued  some of his antihypertensive medications and also reduced dose of carvedilol to 12.5 mg twice daily.  Since then the dizzy spells have improved significantly and patient does  appear to be more alert today.  He is also not orthostatic today.  There is no clinical evidence of heart failure and blood pressure is still very well controlled.  Advised him to continue to monitor his blood pressure closely and to hold hydralazine if SBP <120 mmHg.  Otherwise he remained stable from cardiac standpoint, I will continue to monitor his pacemaker remotely and he has now been transferred into our clinic.  He is also scheduled for in person pacemaker check next week. Remote transmission reviewed, no atrial fibrillation. Normal function.   Patient is DNR per his and wife's wishes and I agree on this approach.  Again I have encouraged her to bring all her medications and the list of medications with her on doctor's visits.   Adrian Prows, MD, [facility] 07/11/2020, 10:03 AM Office: [phone]

## 2020-07-11 ENCOUNTER — Encounter: Payer: Self-pay | Admitting: Cardiology

## 2020-07-11 DIAGNOSIS — Z45018 Encounter for adjustment and management of other part of cardiac pacemaker: Secondary | ICD-10-CM

## 2020-07-11 HISTORY — DX: Encounter for adjustment and management of other part of cardiac pacemaker: Z45.018

## 2020-07-15 ENCOUNTER — Inpatient Hospital Stay: Payer: Medicare HMO | Admitting: Internal Medicine

## 2020-07-15 ENCOUNTER — Other Ambulatory Visit: Payer: Self-pay

## 2020-07-15 ENCOUNTER — Encounter: Payer: Self-pay | Admitting: Cardiology

## 2020-07-15 ENCOUNTER — Ambulatory Visit: Payer: Medicare HMO | Admitting: Cardiology

## 2020-07-15 DIAGNOSIS — I442 Atrioventricular block, complete: Secondary | ICD-10-CM

## 2020-07-15 DIAGNOSIS — I495 Sick sinus syndrome: Secondary | ICD-10-CM

## 2020-07-15 DIAGNOSIS — Z45018 Encounter for adjustment and management of other part of cardiac pacemaker: Secondary | ICD-10-CM

## 2020-07-15 DIAGNOSIS — Z950 Presence of cardiac pacemaker: Secondary | ICD-10-CM

## 2020-07-15 NOTE — Progress Notes (Signed)
    Primary Physician/Referring:  [name], MD  Patient ID: Dylan Ina., male    DOB: 05-Sep-1941, 79 y.o.   MRN: [id number]  Chief Complaint  Patient presents with  . Pacemaker Check    Scheduled  In office pacemaker check 07/16/20  Single (S)/Dual (D)/BV: D. Presenting ASVP @ 63/min. Pacemaker dependant:  Yes. Underlying paced at 30 BPM. AP 13%, VP 100%. AMS Episodes 60 since 01/05//2021.  AT/AF burden <1% . Longest 1 hour 34 min on 01/09/2020.  HVR 1. Others brief AT ? AFL.  Long episode EGM not available. EMS shows NSVT for 14 seconds.  Longevity 8.5 Years. Magnet rate: >85%. Lead measurements: Stable. Histogram: Low (L)/normal (N)/high (H)  Normal. Patient activity: Minimal.   Observations: Normal pacemaker function. Changes: None.  I will continue to monitor him and have a low threshold to anticoagulate if he has AF episodes that are sustained.  Also since medication changes were made on his office visit previously, he has not had any further falls.  In view of risk of fall, he is not on anticoagulation.

## 2020-07-15 NOTE — Telephone Encounter (Signed)
Will today's visit just be a pacemaker check or also OV?

## 2020-07-16 DIAGNOSIS — I951 Orthostatic hypotension: Secondary | ICD-10-CM

## 2020-07-20 ENCOUNTER — Telehealth: Payer: Self-pay

## 2020-07-20 ENCOUNTER — Ambulatory Visit: Payer: Medicare HMO

## 2020-07-20 DIAGNOSIS — N183 Chronic kidney disease, stage 3 unspecified: Secondary | ICD-10-CM

## 2020-07-20 DIAGNOSIS — N1831 Chronic kidney disease, stage 3a: Secondary | ICD-10-CM

## 2020-07-20 NOTE — Patient Instructions (Signed)
Social Worker Visit Information  Goals we discussed today:  Goals Addressed            This Visit's Progress    "we need railings for our porch"       Dylan [name] stated  [place] (see longitudinal plan of care for additional care plan information)  Current Barriers:   Financial constraints related to cost of home modification  ADL IADL limitations  Patient lives in an [place] neighborhood and is limited to options for railings allowed  Chronic conditions including DM II, CKD III, Dementia, and Parkinsonism directly impacting patient ability to access stairs safely without a railing  Social Work Clinical Goal(s):   Over the next 30 days the patient and his caregiver will work with SW to identify resources to assist with home modification need  CCM SW Interventions: Completed 07/20/20 with [name]  Successful outbound call placed to [name] to inform her SW has yet to receive contact from [place] if they are able to accommodate [facility] railing requirements  Discussed the patient continues to work with PT from [place] has also been in discussion with [name] surrounding railing needs o Mrs Arsenio Loader is unaware if a referral to an outside agency has been placed on the patients behalf  Determined Mrs Arsenio Loader would like SW to place a referral to [employer] to determine if patient eligible for program o Successfully placed referral via [employer] platform  Advised [name] it may be several weeks before she is contacted by resource  Scheduled follow up call over the next month  Completed 07/01/20 with [name]  Inter-disciplinary care team collaboration (see longitudinal plan of care)  Successful outbound call placed to [name] to assist with ongoing care coordination  Determined the patient is in need of hand railing in the front and garage entrances o [place] reports the patient home is  in a neighborhood governed by an [place] and railings must be wrought iron  Discussed the patient was seen in the home by a home health SW who informed [name] there was a resource to help with this at no cost o [name] unable to recall home health agency name and has not received more information from home health SW  Advised [name] that most resources that assist with home modifications are based on income o Discussed possible barriers to locating a resource to assist with railings approved by patient [place] with Consulting civil engineer; unable to identify past home health agency involved with the patient  Collaboration with Sharol Given with [employer] to inquire if the agency is able to assist with HOA approved railings  SW to continue to follow  Patient Self Care Activities:   Attends all scheduled provider appointments  Calls pharmacy for medication refills  Calls provider office for new concerns or questions  Supportive caregiver to assist with patient care needs  Please see past updates related to this goal by clicking on the "Past Updates" button in the selected goal          Follow Up Plan: SW will follow up with patient by phone over the next month.   Daneen Schick, BSW, CDP Social Worker, Certified Dementia Practitioner [place] [facility] [place] [facility] [phone]

## 2020-07-20 NOTE — Chronic Care Management (AMB) (Signed)
Chronic Care Management    Social Work Follow Up Note  07/20/2020 Name: Dylan [name]. MRN: [id number] DOB: November 23, 1940  Dylan [name]. is a 79 y.o. year old male who is a primary care patient of [name], MD. The CCM team was consulted for assistance with care coordination.   Review of patient status, including review of consultants reports, other relevant assessments, and collaboration with appropriate care team members and the patient's provider was performed as part of comprehensive patient evaluation and provision of chronic care management services.    SDOH (Social Determinants of Health) assessments performed: Yes SDOH Interventions     Most Recent Value  SDOH Interventions  Food Insecurity Interventions Intervention Not Indicated  Housing Interventions [id number] Referral  [Porch railings]  Transportation Interventions Intervention Not Indicated       Outpatient Encounter Medications as of 07/20/2020  Medication Sig Note  . Ascorbic Acid (VITAMIN C) 1000 MG tablet Take 1,000 mg by mouth every morning.    . carbidopa-levodopa (SINEMET IR) 25-100 MG tablet Take 1.5 tablets by mouth 3 (three) times daily. (Patient taking differently: Take 1.5 tablets by mouth See admin instructions. Take 1 1/2  tablet by mouth three times daily - 10am, 5pm and 10pm)   . carvedilol (COREG) 12.5 MG tablet TAKE 1 TABLET BY MOUTH 2 TIMES DAILY.   [name] Cholecalciferol (VITAMIN D3) 5000 units CAPS Take 5,000 Units by mouth daily with supper.    . COLCRYS 0.6 MG tablet Take 1 tablet (0.6 mg total) by mouth daily. (Patient taking differently: Take 0.6 mg by mouth daily as needed. )   . desonide (DESOWEN) 0.05 % lotion Apply topically 2 times per day prn   . diclofenac Sodium (VOLTAREN) 1 % GEL Apply 2 g topically 3 (three) times daily. (Patient taking differently: Apply 2 g topically as needed. )   . glucose blood (ONETOUCH VERIO) test strip Use as instructed to check blood sugars daily dx: e11.22     . Lancet Devices (ONE TOUCH DELICA LANCING DEV) MISC Please fill ONE TOUCH DELICA LANCING DEVICE.  Use to test blood sugar twice daily as directed. E11.65   . Lancets MISC Please fill basic/generic push button lancets.  Patient to test blood sugar twice daily. DX: E11.65   . Melatonin 10 MG TABS Take 10 mg by mouth at bedtime.    . memantine (NAMENDA) 10 MG tablet TAKE 1 TABLET BY MOUTH TWICE A DAY   . metFORMIN (GLUCOPHAGE) 1000 MG tablet TAKE 1 TABLET BY MOUTH EVERY DAY (Patient taking differently: Take 1,000 mg by mouth every morning. )   . pravastatin (PRAVACHOL) 20 MG tablet TAKE 1 TABLET BY ORAL ROUTE EVERY DAY   . rivastigmine (EXELON) 4.6 mg/24hr Place 1 patch (4.6 mg total) onto the skin daily. (Patient taking differently: Place 4.6 mg onto the skin every evening. ) 04/27/2020: Current patch is on back of right shoulder  . TRULICITY 1.5 TF/5.7DU SOPN INJECT 1.5 MG INTO THE SKIN ONCE A WEEK. (Patient taking differently: Inject 1.5 mg into the skin every Monday. )    No facility-administered encounter medications on file as of 07/20/2020.     Goals Addressed            This Visit's Progress   . "we need railings for our porch"       [name] stated  [place] (see longitudinal plan of care for additional care plan information)  Current Barriers:  . Financial constraints related to cost of  home modification . ADL IADL limitations . Patient lives in an [place] neighborhood and is limited to options for railings allowed . Chronic conditions including DM II, CKD III, Dementia, and Parkinsonism directly impacting patient ability to access stairs safely without a railing  Social Work Clinical Goal(s):  [name] Over the next 30 days the patient and his caregiver will work with SW to identify resources to assist with home modification need  CCM SW Interventions: Completed 07/20/20 with [name] . Successful outbound call placed to [name] to inform her SW has yet to receive  contact from [place] if they are able to accommodate [facility] railing requirements . Discussed the patient continues to work with PT from [place] has also been in discussion with [name] surrounding railing needs o Mrs Arsenio Loader is unaware if a referral to an outside agency has been placed on the patients behalf . Determined Mrs Arsenio Loader would like SW to place a referral to [employer] to determine if patient eligible for program o Successfully placed referral via [employer] . Advised [name] it may be several weeks before she is contacted by resource . Scheduled follow up call over the next month  Completed 07/01/20 with [name] . Inter-disciplinary care team collaboration (see longitudinal plan of care) . Successful outbound call placed to [name] to assist with ongoing care coordination . Determined the patient is in need of hand railing in the front and garage entrances o [name] reports the patient home is in a neighborhood governed by an [place] and railings must be wrought iron . Discussed the patient was seen in the home by a home health SW who informed [name] there was a resource to help with this at no cost o [name] unable to recall home health agency name and has not received more information from home health SW . Advised [name] that most resources that assist with home modifications are based on income o Discussed possible barriers to locating a resource to assist with railings approved by patient HOA . Collaboration with Consulting civil engineer; unable to identify past home health agency involved with the patient . Collaboration with Sharol Given with [employer] to inquire if the agency is able to assist with HOA approved railings . SW to continue to follow  Patient Self Care Activities:  . Attends all scheduled provider appointments . Calls pharmacy for medication  refills . Calls provider office for new concerns or questions . Supportive caregiver to assist with patient care needs  Please see past updates related to this goal by clicking on the "Past Updates" button in the selected goal          Follow Up Plan: SW will follow up with patient by phone over the next month.   Daneen Schick, BSW, CDP Social Worker, Certified Dementia Practitioner [place] [facility] [place] [facility] [phone]  Total time spent performing care coordination and/or care management activities with the patient by phone or face to face = 15 minutes.

## 2020-07-20 NOTE — Telephone Encounter (Signed)
[name] [name] was notified of the pt's next appt dates and times.

## 2020-07-21 ENCOUNTER — Telehealth: Payer: Medicare HMO

## 2020-07-21 ENCOUNTER — Other Ambulatory Visit: Payer: Self-pay

## 2020-07-21 ENCOUNTER — Ambulatory Visit: Payer: Self-pay

## 2020-07-21 DIAGNOSIS — N183 Chronic kidney disease, stage 3 unspecified: Secondary | ICD-10-CM

## 2020-07-21 DIAGNOSIS — F0391 Unspecified dementia with behavioral disturbance: Secondary | ICD-10-CM

## 2020-07-21 DIAGNOSIS — I10 Essential (primary) hypertension: Secondary | ICD-10-CM

## 2020-07-21 DIAGNOSIS — N1831 Chronic kidney disease, stage 3a: Secondary | ICD-10-CM

## 2020-07-21 DIAGNOSIS — E1122 Type 2 diabetes mellitus with diabetic chronic kidney disease: Secondary | ICD-10-CM

## 2020-07-22 NOTE — Progress Notes (Signed)
This encounter was created in error - please disregard.

## 2020-07-23 NOTE — Patient Instructions (Signed)
Visit Information  Goals Addressed    . "to improve his mood and agitation"       Spouse stated:  CARE PLAN ENTRY (see longitudinal plan of care for additional care plan information)  Current Barriers:  [name] Knowledge Deficits related to disease process related to Dementia/Parkinsonism . Chronic Disease Management support and education needs related to Type II DM, non-insulin dependent, CKD, stage III, Essential hypertension, Dementia . Cognitive Deficits . 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 Inter-disciplinary care team collaboration (see longitudinal plan of care) . 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 . Provided education to patient re: disease process and stages of dementia, inlcuding Visual hallucinations, Delusions, especially paranoid ideas , Depression, irritability and anxiety; encouraged spouse to discuss these symptoms with Dr. Krista Blue, Neurologist at next appointment scheduled for 07/27/20 . Reviewed medications with patient and discussed spouse is administering patient's prescribed medications as directed, current regimen:  o carbidopa-levodopa (SINEMET IR) 25-100 MG tablet, Take 1.5 tablets by mouth 3 (three) times daily. (Patient taking differently: Take 1.5 tablets by mouth See admin instructions. Take 1 1/2  tablet by mouth three times daily - 10am, 5pm and 10pm) o  memantine (NAMENDA) 10 MG tablet, Take 1 tablet (10 mg total) by mouth 2 (two) times daily o  rivastigmine (EXELON) 4.6 mg/24hr, Place 1 patch (4.6 mg total) onto the skin daily. (Patient taking differently:  Place 4.6 mg onto the skin every evening . Collaborated with embedded [place] regarding spouse's request for caregiver resources, spouse is able to private pay . Discussed plans with patient for ongoing care management follow up and provided patient with direct contact information for care management team  Patient Self Care Activities:  . Attends all scheduled provider appointments . Currently UNABLE TO independently perform self-care without assistance  . Supportive caregiver, spouse [name] to assist with caregiver needs  Initial goal documentation       Patient verbalizes understanding of instructions provided today.   Telephone follow up appointment with care management team member scheduled for: 09/01/20  [name], RN, BSN, CCM Care Management Coordinator [facility]  Direct Phone: [phone]

## 2020-07-23 NOTE — Chronic Care Management (AMB) (Signed)
Chronic Care Management   Follow Up Note   07/21/2020 Name: Dylan [name]. MRN: [id number] DOB: 1941/09/03  Referred by: [name], MD Reason for referral : Chronic Care Management (Inbound call from spouse)   Dylan [name]. is a 79 y.o. year old male who is a primary care patient of [name], MD. The CCM team was consulted for assistance with chronic disease management and care coordination needs.    Review of patient status, including review of consultants reports, relevant laboratory and other test results, and collaboration with appropriate care team members and the patient's provider was performed as part of comprehensive patient evaluation and provision of chronic care management services.    SDOH (Social Determinants of Health) assessments performed: Yes See Care Plan activities for detailed interventions related to [place])   Inbound call received from spouse to discuss patient's dementia.     Outpatient Encounter Medications as of 07/21/2020  Medication Sig Note  . Ascorbic Acid (VITAMIN C) 1000 MG tablet Take 1,000 mg by mouth every morning.    . carbidopa-levodopa (SINEMET IR) 25-100 MG tablet Take 1.5 tablets by mouth 3 (three) times daily. (Patient taking differently: Take 1.5 tablets by mouth See admin instructions. Take 1 1/2  tablet by mouth three times daily - 10am, 5pm and 10pm)   . carvedilol (COREG) 12.5 MG tablet TAKE 1 TABLET BY MOUTH 2 TIMES DAILY.   [name] Cholecalciferol (VITAMIN D3) 5000 units CAPS Take 5,000 Units by mouth daily with supper.    . COLCRYS 0.6 MG tablet Take 1 tablet (0.6 mg total) by mouth daily. (Patient taking differently: Take 0.6 mg by mouth daily as needed. )   . desonide (DESOWEN) 0.05 % lotion Apply topically 2 times per day prn   . diclofenac Sodium (VOLTAREN) 1 % GEL Apply 2 g topically 3 (three) times daily. (Patient taking differently: Apply 2 g topically as needed. )   . glucose blood (ONETOUCH VERIO) test strip Use as  instructed to check blood sugars daily dx: e11.22   . Lancet Devices (ONE TOUCH DELICA LANCING DEV) MISC Please fill ONE TOUCH DELICA LANCING DEVICE.  Use to test blood sugar twice daily as directed. E11.65   . Lancets MISC Please fill basic/generic push button lancets.  Patient to test blood sugar twice daily. DX: E11.65   . Melatonin 10 MG TABS Take 10 mg by mouth at bedtime.    . memantine (NAMENDA) 10 MG tablet TAKE 1 TABLET BY MOUTH TWICE A DAY   . metFORMIN (GLUCOPHAGE) 1000 MG tablet TAKE 1 TABLET BY MOUTH EVERY DAY (Patient taking differently: Take 1,000 mg by mouth every morning. )   . pravastatin (PRAVACHOL) 20 MG tablet TAKE 1 TABLET BY ORAL ROUTE EVERY DAY   . rivastigmine (EXELON) 4.6 mg/24hr Place 1 patch (4.6 mg total) onto the skin daily. (Patient taking differently: Place 4.6 mg onto the skin every evening. ) 04/27/2020: Current patch is on back of right shoulder  . TRULICITY 1.5 XH/3.7JI SOPN INJECT 1.5 MG INTO THE SKIN ONCE A WEEK. (Patient taking differently: Inject 1.5 mg into the skin every Monday. )    No facility-administered encounter medications on file as of 07/21/2020.     Objective:  Lab Results  Component Value Date   HGBA1C 7.0 (H) 06/03/2020   HGBA1C 7.5 (H) 03/24/2020   HGBA1C 8.1 (H) 12/23/2019   Lab Results  Component Value Date   MICROALBUR 80 01/22/2019   LDLCALC 66 07/24/2019   CREATININE 1.17  05/11/2020   BP Readings from Last 3 Encounters:  07/10/20 134/67  06/08/20 (!) 96/48  05/11/20 135/66    Goals Addressed    . "to improve his mood and agitation"       Spouse stated:  CARE PLAN ENTRY (see longitudinal plan of care for additional care plan information)  Current Barriers:  [name] Knowledge Deficits related to disease process related to Dementia/Parkinsonism . Chronic Disease Management support and education needs related to Type II DM, non-insulin dependent, CKD, stage III, Essential hypertension, Dementia . Cognitive Deficits . 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 Inter-disciplinary care team collaboration (see longitudinal plan of care) . 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 . Provided education to patient re: disease process and stages of dementia, inlcuding Visual hallucinations, Delusions, especially paranoid ideas , Depression, irritability and anxiety; encouraged spouse to discuss these symptoms with Dr. Krista Blue, Neurologist at next appointment scheduled for 07/27/20 . Reviewed medications with patient and discussed spouse is administering patient's prescribed medications as directed, current regimen:  o carbidopa-levodopa (SINEMET IR) 25-100 MG tablet, Take 1.5 tablets by mouth 3 (three) times daily. (Patient taking differently: Take 1.5 tablets by mouth See admin instructions. Take 1 1/2  tablet by mouth three times daily - 10am, 5pm and 10pm) o  memantine (NAMENDA) 10 MG tablet, Take 1 tablet (10 mg total) by mouth 2 (two) times daily o  rivastigmine (EXELON) 4.6 mg/24hr, Place 1 patch (4.6 mg total) onto the skin daily. (Patient taking differently: Place 4.6 mg onto the skin every evening . Collaborated with embedded [place] regarding spouse's request for caregiver resources, spouse is able to private pay . Discussed plans with patient for ongoing care management follow up and provided patient with direct contact information for care management team  Patient Self Care Activities:  . Attends all scheduled provider appointments . Currently UNABLE TO independently perform self-care without assistance   . Supportive caregiver, spouse [name] to assist with caregiver needs  Initial goal documentation       Plan:   Telephone follow up appointment with care management team member scheduled for: 09/01/20  [name], RN, BSN, CCM Care Management Coordinator [place] [facility]  Direct Phone: [phone]

## 2020-07-25 DIAGNOSIS — F0281 Dementia in other diseases classified elsewhere with behavioral disturbance: Secondary | ICD-10-CM

## 2020-07-25 DIAGNOSIS — I951 Orthostatic hypotension: Secondary | ICD-10-CM

## 2020-07-25 DIAGNOSIS — G20 Parkinson's disease: Secondary | ICD-10-CM

## 2020-07-27 ENCOUNTER — Encounter: Payer: Self-pay | Admitting: Neurology

## 2020-07-27 ENCOUNTER — Ambulatory Visit: Payer: Medicare HMO | Admitting: Neurology

## 2020-07-27 VITALS — BP 143/66 | HR 60 | Ht 71.0 in | Wt 173.0 lb

## 2020-07-27 DIAGNOSIS — G20 Parkinson's disease: Secondary | ICD-10-CM

## 2020-07-27 DIAGNOSIS — R269 Unspecified abnormalities of gait and mobility: Secondary | ICD-10-CM

## 2020-07-27 DIAGNOSIS — F0391 Unspecified dementia with behavioral disturbance: Secondary | ICD-10-CM | POA: Diagnosis not present

## 2020-07-27 MED ORDER — QUETIAPINE FUMARATE 25 MG PO TABS: 25.0000 mg | ORAL_TABLET | Freq: Every evening | ORAL | 6 refills | Status: DC | PRN

## 2020-07-27 MED ORDER — RIVASTIGMINE 9.5 MG/24HR TD PT24
9.5000 mg | MEDICATED_PATCH | Freq: Every day | TRANSDERMAL | 4 refills | Status: DC
Start: 2020-07-27 — End: 2020-10-26

## 2020-07-27 MED ORDER — CARBIDOPA-LEVODOPA 25-100 MG PO TABS
2.0000 | ORAL_TABLET | Freq: Three times a day (TID) | ORAL | 11 refills | Status: DC
Start: 2020-07-27 — End: 2020-12-24
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
## 2020-07-29 ENCOUNTER — Ambulatory Visit: Payer: Medicare HMO | Admitting: Internal Medicine

## 2020-07-29 ENCOUNTER — Other Ambulatory Visit: Payer: Self-pay

## 2020-07-29 ENCOUNTER — Encounter: Payer: Self-pay | Admitting: Internal Medicine

## 2020-07-29 VITALS — BP 130/74 | HR 80 | Temp 98.1°F | Ht 66.8 in | Wt 170.6 lb

## 2020-07-29 DIAGNOSIS — Z1159 Encounter for screening for other viral diseases: Secondary | ICD-10-CM

## 2020-07-29 DIAGNOSIS — J8410 Pulmonary fibrosis, unspecified: Secondary | ICD-10-CM

## 2020-07-29 DIAGNOSIS — Z23 Encounter for immunization: Secondary | ICD-10-CM

## 2020-07-29 DIAGNOSIS — R3911 Hesitancy of micturition: Secondary | ICD-10-CM | POA: Diagnosis not present

## 2020-07-29 DIAGNOSIS — N1831 Chronic kidney disease, stage 3a: Secondary | ICD-10-CM

## 2020-07-29 DIAGNOSIS — Z6826 Body mass index (BMI) 26.0-26.9, adult: Secondary | ICD-10-CM

## 2020-07-29 DIAGNOSIS — E538 Deficiency of other specified B group vitamins: Secondary | ICD-10-CM

## 2020-07-29 DIAGNOSIS — E1121 Type 2 diabetes mellitus with diabetic nephropathy: Secondary | ICD-10-CM

## 2020-07-29 DIAGNOSIS — E663 Overweight: Secondary | ICD-10-CM

## 2020-07-29 DIAGNOSIS — E1122 Type 2 diabetes mellitus with diabetic chronic kidney disease: Secondary | ICD-10-CM

## 2020-07-29 NOTE — Progress Notes (Signed)
I,Tianna Badgett,acting as a Education administrator for Maximino Greenland, MD.,have documented all relevant documentation on the behalf of Maximino Greenland, MD,as directed by  Maximino Greenland, MD while in the presence of Maximino Greenland, MD.  This visit occurred during the SARS-CoV-2 public health emergency.  Safety protocols were in place, including screening questions prior to the visit, additional usage of staff PPE, and extensive cleaning of exam room while observing appropriate contact time as indicated for disinfecting solutions.  Subjective:     Patient ID: Dylan [name]. , male    DOB: Jun 12, 1941 , 79 y.o.   MRN: [id number]   Chief Complaint  Patient presents with  . Diabetes  . Hypertension    HPI  Patient is here today for dm follow up. He is compliant with medications. He is accompanied by his wife today. He is now back at home, after spending some time in rehab. He reports he is feeling stronger. She reports his diet has changed, he is eating less sweets.   Diabetes He presents for his follow-up diabetic visit. He has type 2 diabetes mellitus. There are no hypoglycemic associated symptoms. Pertinent negatives for diabetes include no blurred vision and no chest pain. There are no hypoglycemic complications. Diabetic complications include nephropathy. Risk factors for coronary artery disease include diabetes mellitus, dyslipidemia, hypertension, sedentary lifestyle, male sex and obesity. He is compliant with treatment some of the time. He is following a diabetic diet. He participates in exercise intermittently. His breakfast blood glucose is taken between 9-10 am. His breakfast blood glucose range is generally 140-180 mg/dl. An ACE inhibitor/angiotensin II receptor blocker is being taken. He does not see a podiatrist.Eye exam is current.  Hypertension This is a chronic problem. The current episode started more than 1 year ago. The problem has been gradually improving since onset. The problem is  controlled. Pertinent negatives include no blurred vision or chest pain. Risk factors for coronary artery disease include diabetes mellitus, dyslipidemia, male gender, obesity and sedentary lifestyle. There is no history of sleep apnea.     Past Medical History:  Diagnosis Date  . Anxiety   . Aortic stenosis    mild AS 11/24/16 (peak grad 24, mean grad 11) Dr. [name]  . CHB (complete heart block) ([place]) 07/2017  . 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 Diabetes mellitus without complication ([place])   . Encounter for care of pacemaker 07/11/2020  . Gait abnormality   . Gout   . Hyperlipemia   . Hypertension   . 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 Memory loss   . 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 Peripheral arterial disease ([place])   . 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 Second degree AV block    Wenckebach; no indication for pacemaker as of 12/01/16 (Dr. [name])  . Vitamin B12 deficiency anemia 03/01/2018  . Vitamin D deficiency disease      Family History  Problem Relation Age of Onset  . Diabetes Mother   . Breast cancer Mother   . Arthritis Mother   . Hypertension Father   . Diabetes Sister   . Diabetes Brother   . Diabetes Maternal Grandmother   . Diabetes Brother   . Diabetes Brother   . Diabetes Brother   . Diabetes Sister   . Diabetes Sister   . Diabetes Sister  Current Outpatient Medications:  .  Ascorbic Acid (VITAMIN C) 1000 MG tablet, Take 1,000 mg by mouth every morning. , Disp: , Rfl:  .  carbidopa-levodopa (SINEMET IR) 25-100 MG tablet, Take 2 tablets by mouth 3 (three) times daily., Disp: 180 tablet, Rfl: 11 .  carvedilol (COREG) 12.5 MG tablet, TAKE 1 TABLET BY MOUTH 2 TIMES DAILY., Disp: 60 tablet, Rfl: 11 .  Cholecalciferol (VITAMIN D3) 5000 units CAPS, Take 5,000 Units by mouth daily with supper. , Disp: , Rfl:  .   COLCRYS 0.6 MG tablet, Take 1 tablet (0.6 mg total) by mouth daily. (Patient taking differently: Take 0.6 mg by mouth daily as needed. ), Disp: 90 tablet, Rfl: 1 .  desonide (DESOWEN) 0.05 % lotion, Apply topically 2 times per day prn, Disp: 59 mL, Rfl: 1 .  diclofenac Sodium (VOLTAREN) 1 % GEL, Apply 2 g topically 3 (three) times daily. (Patient taking differently: Apply 2 g topically as needed. ), Disp: 150 g, Rfl: 0 .  glucose blood (ONETOUCH VERIO) test strip, Use as instructed to check blood sugars daily dx: e11.22, Disp: 150 each, Rfl: 3 .  Lancet Devices (ONE TOUCH DELICA LANCING DEV) MISC, Please fill ONE TOUCH DELICA LANCING DEVICE.  Use to test blood sugar twice daily as directed. E11.65, Disp: 1 each, Rfl: 1 .  Lancets MISC, Please fill basic/generic push button lancets.  Patient to test blood sugar twice daily. DX: E11.65, Disp: 100 each, Rfl: 4 .  Melatonin 10 MG TABS, Take 10 mg by mouth at bedtime. , Disp: , Rfl:  .  memantine (NAMENDA) 10 MG tablet, TAKE 1 TABLET BY MOUTH TWICE A DAY, Disp: 60 tablet, Rfl: 14 .  metFORMIN (GLUCOPHAGE) 1000 MG tablet, TAKE 1 TABLET BY MOUTH EVERY DAY, Disp: 30 tablet, Rfl: 2 .  pravastatin (PRAVACHOL) 20 MG tablet, TAKE 1 TABLET BY ORAL ROUTE EVERY DAY, Disp: 30 tablet, Rfl: 2 .  QUEtiapine (SEROQUEL) 25 MG tablet, Take 1 tablet (25 mg total) by mouth at bedtime as needed., Disp: 30 tablet, Rfl: 6 .  rivastigmine (EXELON) 9.5 mg/24hr, Place 1 patch (9.5 mg total) onto the skin daily., Disp: 90 patch, Rfl: 4 .  TRULICITY 1.5 WE/9.9BZ SOPN, INJECT 1.5 MG INTO THE SKIN ONCE A WEEK. (Patient taking differently: Inject 1.5 mg into the skin every Monday. ), Disp: 12 pen, Rfl: 1   No Known Allergies   Review of Systems  Constitutional: Negative.   Eyes: Negative for blurred vision.  Respiratory: Negative.   Cardiovascular: Negative.  Negative for chest pain.  Gastrointestinal: Negative.   Neurological: Negative.      Today's Vitals   07/29/20 1616   BP: 130/74  Pulse: 80  Temp: 98.1 F (36.7 C)  TempSrc: Oral  Weight: 170 lb 9.6 oz (77.4 kg)  Height: 5' 6.8" (1.697 m)   Body mass index is 26.88 kg/m.   Objective:  Physical Exam Vitals and nursing note reviewed.  Constitutional:      Appearance: Normal appearance.  HENT:     Head: Normocephalic and atraumatic.  Cardiovascular:     Rate and Rhythm: Normal rate and regular rhythm.     Heart sounds: Normal heart sounds.  Pulmonary:     Effort: Pulmonary effort is normal.     Breath sounds: Normal breath sounds.  Skin:    General: Skin is warm.  Neurological:     Mental Status: He is alert.     Comments: Shuffling gait  Psychiatric:  Mood and Affect: Mood normal.         Assessment And Plan:     1. Type 2 diabetes mellitus with stage 3a chronic kidney disease, without long-term current use of insulin (HCC) Comments: Chronic, I will check labs as listed below. I will adjust meds as needed. H ewas congratulated on improved lifestyle changes.  - POCT UA - Microalbumin - Hepatitis C antibody  2. Urinary hesitancy Comments: I will check PSA as requested. He also requests Urology evaluation.  - PSA  3. [facility] granuloma [facility]) Comments: CXR August 2019 reviewed. 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 No further follow-up required.  4. Vitamin B12 deficiency Comments: I will check vitamin B12 level today. I will supplement as needed.  - Vitamin B12  5. Encounter for HCV screening test for low risk patient Comments: I will check HCV antibody today.   6. Need for influenza vaccination Comments: He was given high dose flu vaccine.  - Flu Vaccine QUAD High Dose(Fluad)  7. Overweight with body mass index (BMI) of 26 to 26.9 in adult Comments: He is encouraged to perform chair exercises while seated.  He is encouraged to initially strive for BMI less than 26 to decrease cardiac risk. He is advised to exercise no less than 150 minutes per  week.  Wt Readings from Last 3 Encounters:  07/29/20 170 lb 9.6 oz (77.4 kg)  07/27/20 173 lb (78.5 kg)  07/10/20 168 lb (76.2 kg)     Patient was given opportunity to ask questions. Patient verbalized understanding of the plan and was able to repeat key elements of the plan. All questions were answered to their satisfaction.  Maximino Greenland, MD   I, Maximino Greenland, MD, have reviewed all documentation for this visit. The documentation on 08/15/20 for the exam, diagnosis, procedures, and orders are all accurate and complete.  THE PATIENT IS ENCOURAGED TO PRACTICE SOCIAL DISTANCING DUE TO THE COVID-19 PANDEMIC.

## 2020-07-29 NOTE — Patient Instructions (Signed)
Diabetes Mellitus and Exercise Exercising regularly is important for your overall health, especially when you have diabetes (diabetes mellitus). Exercising is not only about losing weight. It has many other health benefits, such as increasing muscle strength and bone density and reducing body fat and stress. This leads to improved fitness, flexibility, and endurance, all of which result in better overall health. Exercise has additional benefits for people with diabetes, including:  Reducing appetite.  Helping to lower and control blood glucose.  Lowering blood pressure.  Helping to control amounts of fatty substances (lipids) in the blood, such as cholesterol and triglycerides.  Helping the body to respond better to insulin (improving insulin sensitivity).  Reducing how much insulin the body needs.  Decreasing the risk for heart disease by: ? Lowering cholesterol and triglyceride levels. ? Increasing the levels of good cholesterol. ? Lowering blood glucose levels. What is my activity plan? Your health care provider or certified diabetes educator can help you make a plan for the type and frequency of exercise (activity plan) that works for you. Make sure that you:  Do at least 150 minutes of moderate-intensity or vigorous-intensity exercise each week. This could be brisk walking, biking, or water aerobics. ? Do stretching and strength exercises, such as yoga or weightlifting, at least 2 times a week. ? Spread out your activity over at least 3 days of the week.  Get some form of physical activity every day. ? Do not go more than 2 days in a row without some kind of physical activity. ? Avoid being inactive for more than 30 minutes at a time. Take frequent breaks to walk or stretch.  Choose a type of exercise or activity that you enjoy, and set realistic goals.  Start slowly, and gradually increase the intensity of your exercise over time. What do I need to know about managing my  diabetes?   Check your blood glucose before and after exercising. ? If your blood glucose is 240 mg/dL (13.3 mmol/L) or higher before you exercise, check your urine for ketones. If you have ketones in your urine, do not exercise until your blood glucose returns to normal. ? If your blood glucose is 100 mg/dL (5.6 mmol/L) or lower, eat a snack containing 15-20 grams of carbohydrate. Check your blood glucose 15 minutes after the snack to make sure that your level is above 100 mg/dL (5.6 mmol/L) before you start your exercise.  Know the symptoms of low blood glucose (hypoglycemia) and how to treat it. Your risk for hypoglycemia increases during and after exercise. Common symptoms of hypoglycemia can include: ? Hunger. ? Anxiety. ? Sweating and feeling clammy. ? Confusion. ? Dizziness or feeling light-headed. ? Increased heart rate or palpitations. ? Blurry vision. ? Tingling or numbness around the mouth, lips, or tongue. ? Tremors or shakes. ? Irritability.  Keep a rapid-acting carbohydrate snack available before, during, and after exercise to help prevent or treat hypoglycemia.  Avoid injecting insulin into areas of the body that are going to be exercised. For example, avoid injecting insulin into: ? The arms, when playing tennis. ? The legs, when jogging.  Keep records of your exercise habits. Doing this can help you and your health care provider adjust your diabetes management plan as needed. Write down: ? Food that you eat before and after you exercise. ? Blood glucose levels before and after you exercise. ? The type and amount of exercise you have done. ? When your insulin is expected to peak, if you use   insulin. Avoid exercising at times when your insulin is peaking.  When you start a new exercise or activity, work with your health care provider to make sure the activity is safe for you, and to adjust your insulin, medicines, or food intake as needed.  Drink plenty of water while  you exercise to prevent dehydration or heat stroke. Drink enough fluid to keep your urine clear or pale yellow. Summary  Exercising regularly is important for your overall health, especially when you have diabetes (diabetes mellitus).  Exercising has many health benefits, such as increasing muscle strength and bone density and reducing body fat and stress.  Your health care provider or certified diabetes educator can help you make a plan for the type and frequency of exercise (activity plan) that works for you.  When you start a new exercise or activity, work with your health care provider to make sure the activity is safe for you, and to adjust your insulin, medicines, or food intake as needed. This information is not intended to replace advice given to you by your health care provider. Make sure you discuss any questions you have with your health care provider. Document Revised: 05/11/2017 Document Reviewed: 03/28/2016 Elsevier Patient Education  2020 [employer]  

## 2020-07-30 ENCOUNTER — Ambulatory Visit: Payer: Medicare HMO | Admitting: Cardiology

## 2020-07-30 ENCOUNTER — Telehealth: Payer: Medicare HMO

## 2020-07-30 ENCOUNTER — Other Ambulatory Visit: Payer: Self-pay | Admitting: Internal Medicine

## 2020-07-30 DIAGNOSIS — R972 Elevated prostate specific antigen [PSA]: Secondary | ICD-10-CM

## 2020-07-30 LAB — PSA: Prostate Specific Ag, Serum: 10.7 ng/mL — ABNORMAL HIGH (ref 0.0–4.0)

## 2020-07-30 LAB — VITAMIN B12: Vitamin B-12: 294 pg/mL (ref 232–1245)

## 2020-07-30 LAB — HEPATITIS C ANTIBODY: Hep C Virus Ab: <0.1 s/co ratio (ref 0.0–0.9)

## 2020-07-31 ENCOUNTER — Ambulatory Visit: Payer: Medicare HMO

## 2020-07-31 DIAGNOSIS — N1831 Chronic kidney disease, stage 3a: Secondary | ICD-10-CM

## 2020-07-31 DIAGNOSIS — F0391 Unspecified dementia with behavioral disturbance: Secondary | ICD-10-CM

## 2020-07-31 DIAGNOSIS — E1122 Type 2 diabetes mellitus with diabetic chronic kidney disease: Secondary | ICD-10-CM

## 2020-07-31 DIAGNOSIS — G20C Parkinsonism, unspecified: Secondary | ICD-10-CM

## 2020-07-31 DIAGNOSIS — N183 Chronic kidney disease, stage 3 unspecified: Secondary | ICD-10-CM

## 2020-07-31 DIAGNOSIS — G20 Parkinson's disease: Secondary | ICD-10-CM

## 2020-07-31 NOTE — Patient Instructions (Signed)
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 to assist with care coordination needs o Mrs Arsenio Loader reports she is interested in more affordable respite options to assist for hours/day/weekends when she needs assistance caring for the patient  Educated Mrs Arsenio Loader on in home caregivers options to assist with a few hours when running errands o Mrs Arsenio Loader reports she is not willing to have a caregiver in the home while she is not present and declined this resource option  Educated Mrs Arsenio Loader on adult day program offered by [employer] for daily care o Advised Mrs Arsenio Loader this is a resource she would have to use and/or pay for consistently for staffing purposes o Mailed information on this resource for review  Discussed for weekend respite care she would need to look at ALF or SNF for out of home care o The patient has already accessed respite at [facility] several times; Mrs Arsenio Loader would like an option closer to home o Mailed a list of Assisted Living Facilities in [place] for review o Advised Mrs Colbert Ewing is not clear on each ALF's respite  guidelines at this time  Scheduled follow up over the next month  Patient Self Care Activities: With the help of Mrs Arsenio Loader   Self administers medications as prescribed  Attends all scheduled provider appointments  Calls provider office for new concerns or questions  Supportive caregiver to assist with patient care needs  Initial goal documentation       Other     "His balance is much worse and he needs a walker"        CARE PLAN ENTRY (see longitudinal plan of care for additional care plan information)  Spouse states Current Barriers:   Impaired gait/balance  High Risk for Falls  Newly diagnosed; Mild cortical atrophy mostly in the mesial temporal lobes and parietal lobes.  Newly diagnosed; Mild chronic microvascular ischemic changes  Nurse Case Manager Clinical Goal(s):   Over the next 30 days, patient will work with the CCM team to address needs related to impaired gait disturbance and high risk for falls Goal Met   08/02/19 Over the next 30 days, patient will complete outpatient PT and will be able to adhere to his prescribed HEP Goal Partially Met  New 03/12/20 Over the next 90 days, caregiver will confirm patient is using his DME as directed by MD and PCP to assist with mobility and gait AS EVIDENCE BY patient will experience improved mobility w/o falls or injury related to falls  CCM SW Interventions: Completed 07/31/20 with [name]  Successful outbound call placed to Mrs Arsenio Loader  to assist with care coordination needs  Determined she is interested in literature outlining how to safely transfer a person as her back is beginning to hurt  Collaboration with RN Care Manager to obtain information to send Mrs Arsenio Loader  CCM RN CM Interventions:  06/09/20 call completed with spouse Ancil Linsey care team collaboration (see longitudinal plan of care)  Evaluation of current treatment plan related to Impaired Physical Mobility and patient's  adherence to plan as established by provider  Determined patient has been d/c home from in patient rehabilitation and is currently receiving in home PT/OT  Determined patient has a semi-electric hospital bed and all other DME needed at this time  Discussed spouse's concerns that patient has exited the bed w/o her assistance and she is fearful he will have a fall  Provided spouse with the contact information for a chair exit alarm; discussed how this home safety device works; discussed this safety device is not covered by Medicare and will have to be paid for out of pocket; discussed DME suppliers in the area and provided spouse with the contact numbers and address's  Determined patient has not experienced any falls since being discharged home from the hospital  Discussed plans with patient for ongoing care management follow up and provided patient with direct contact information for care management team  Patient Self Care Activities:   Attends all scheduled provider appointments  Currently UNABLE TO independently perform self-care without assistance   Supportive caregiver, spouse [name] to assist with caregiver needs  Please see past updates related to this goal by clicking on the "Past Updates" button in the selected goal          Materials Provided: Yes: provided resource information via mail  Follow Up Plan: SW will follow up with patient by phone over the next month   Daneen Schick, BSW, CDP Social Worker, Certified Dementia Practitioner [place] [facility] [phone]

## 2020-07-31 NOTE — Chronic Care Management (AMB) (Signed)
Chronic Care Management    Social Work Follow Up Note  07/31/2020 Name: Dylan [name]. MRN: [id number] DOB: 1941-03-31  Dylan [name]. is a 79 y.o. year old male who is a primary care patient of [name], MD. The CCM team was consulted for assistance with care coordination.   Review of patient status, including review of consultants reports, other relevant assessments, and collaboration with appropriate care team members and the patient's provider was performed as part of comprehensive patient evaluation and provision of chronic care management services.    SDOH (Social Determinants of Health) assessments performed: No    Outpatient Encounter Medications as of 07/31/2020  Medication Sig   Ascorbic Acid (VITAMIN C) 1000 MG tablet Take 1,000 mg by mouth every morning.    carbidopa-levodopa (SINEMET IR) 25-100 MG tablet Take 2 tablets by mouth 3 (three) times daily.   carvedilol (COREG) 12.5 MG tablet TAKE 1 TABLET BY MOUTH 2 TIMES DAILY.   Cholecalciferol (VITAMIN D3) 5000 units CAPS Take 5,000 Units by mouth daily with supper.    COLCRYS 0.6 MG tablet Take 1 tablet (0.6 mg total) by mouth daily. (Patient taking differently: Take 0.6 mg by mouth daily as needed. )   desonide (DESOWEN) 0.05 % lotion Apply topically 2 times per day prn   diclofenac Sodium (VOLTAREN) 1 % GEL Apply 2 g topically 3 (three) times daily. (Patient taking differently: Apply 2 g topically as needed. )   glucose blood (ONETOUCH VERIO) test strip Use as instructed to check blood sugars daily dx: e11.22   Lancet Devices (ONE TOUCH DELICA LANCING DEV) MISC Please fill ONE TOUCH DELICA LANCING DEVICE.  Use to test blood sugar twice daily as directed. E11.65   Lancets MISC Please fill basic/generic push button lancets.  Patient to test blood sugar twice daily. DX: E11.65   Melatonin 10 MG TABS Take 10 mg by mouth at bedtime.    memantine (NAMENDA) 10 MG tablet TAKE 1 TABLET BY MOUTH TWICE A DAY    metFORMIN (GLUCOPHAGE) 1000 MG tablet TAKE 1 TABLET BY MOUTH EVERY DAY (Patient taking differently: Take 1,000 mg by mouth every morning. )   pravastatin (PRAVACHOL) 20 MG tablet TAKE 1 TABLET BY ORAL ROUTE EVERY DAY   QUEtiapine (SEROQUEL) 25 MG tablet Take 1 tablet (25 mg total) by mouth at bedtime as needed.   rivastigmine (EXELON) 9.5 mg/24hr Place 1 patch (9.5 mg total) onto the skin daily.   TRULICITY 1.5 QM/0.8QP SOPN INJECT 1.5 MG INTO THE SKIN ONCE A WEEK. (Patient taking differently: Inject 1.5 mg into the skin every Monday. )   No facility-administered encounter medications on file as of 07/31/2020.     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 to assist with care coordination needs o Mrs Arsenio Loader reports she is interested in more affordable respite options to assist for hours/day/weekends when she needs assistance caring for the patient  Educated Mrs Arsenio Loader on in home caregivers options to assist with a few hours when running errands o Mrs Arsenio Loader reports she is not willing to have a caregiver in the home while she is not present and declined this resource option  Educated Mrs Arsenio Loader on adult day program  offered by [employer] for daily care o Advised Mrs Arsenio Loader this is a resource she would have to use and/or pay for consistently for staffing purposes o Mailed information on this resource for review  Discussed for weekend respite care she would need to look at ALF or SNF for out of home care o The patient has already accessed respite at [facility] several times; Mrs Arsenio Loader would like an option closer to home o Mailed a list of Assisted Living Facilities in [place] for review o Advised Mrs Colbert Ewing is not clear on each ALF's respite guidelines at this time  Scheduled follow up over the next month  Patient Self Care Activities: With the help of Mrs Arsenio Loader   Self administers medications as prescribed  Attends all scheduled provider appointments  Calls provider office for new concerns or questions  Supportive caregiver to assist with patient care needs  Initial goal documentation       Other     "His balance is much worse and he needs a walker"        CARE PLAN ENTRY (see longitudinal plan of care for additional care plan information)  Spouse states Current Barriers:   Impaired gait/balance  High Risk for Falls  Newly diagnosed; Mild cortical atrophy mostly in the mesial temporal lobes and parietal lobes.  Newly diagnosed; Mild chronic microvascular ischemic changes  Nurse Case Manager Clinical Goal(s):   Over the next 30 days, patient will work with the CCM team to address needs related to impaired gait disturbance and high risk for falls Goal Met   08/02/19 Over the next 30 days, patient will complete outpatient PT and will be able to adhere to his prescribed HEP Goal Partially Met  New 03/12/20 Over the next 90 days, caregiver will confirm patient is using his DME as directed by MD and PCP to assist with mobility and gait AS EVIDENCE BY patient will experience improved mobility w/o falls or injury related to falls  CCM SW Interventions: Completed  07/31/20 with [name]  Successful outbound call placed to Mrs Arsenio Loader to assist with care coordination needs  Determined she is interested in literature outlining how to safely transfer a person as her back is beginning to hurt  Collaboration with RN Care Manager to obtain information to send Mrs Arsenio Loader  CCM RN CM Interventions:  06/09/20 call completed with spouse Ancil Linsey care team collaboration (see longitudinal plan of care)  Evaluation of current treatment plan related to Impaired Physical Mobility and patient's adherence to plan as established by provider  Determined patient has been d/c home from in patient rehabilitation and is currently receiving in home PT/OT  Determined patient has a semi-electric hospital bed and all other DME needed at this time  Discussed spouse's concerns that patient has exited the bed w/o her assistance and she is fearful he will have a fall  Provided spouse with the contact information for a chair  exit alarm; discussed how this home safety device works; discussed this safety device is not covered by Medicare and will have to be paid for out of pocket; discussed DME suppliers in the area and provided spouse with the contact numbers and address's  Determined patient has not experienced any falls since being discharged home from the hospital  Discussed plans with patient for ongoing care management follow up and provided patient with direct contact information for care management team  Patient Self Care Activities:   Attends all scheduled provider appointments  Currently UNABLE TO independently perform self-care without assistance   Supportive caregiver, spouse [name] to assist with caregiver needs  Please see past updates related to this goal by clicking on the "Past Updates" button in the selected goal          Follow Up Plan: SW will follow up with patient by phone over the next month.   Daneen Schick, BSW,  CDP Social Worker, Certified Dementia Practitioner [place] [facility] [phone]  Total time spent performing care coordination and/or care management activities with the patient by phone or face to face = 15 minutes.

## 2020-08-03 ENCOUNTER — Telehealth: Payer: Self-pay

## 2020-08-03 ENCOUNTER — Telehealth: Payer: Medicare HMO

## 2020-08-03 ENCOUNTER — Other Ambulatory Visit: Payer: Self-pay

## 2020-08-03 DIAGNOSIS — F0281 Dementia in other diseases classified elsewhere with behavioral disturbance: Secondary | ICD-10-CM

## 2020-08-03 DIAGNOSIS — G20 Parkinson's disease: Secondary | ICD-10-CM

## 2020-08-03 DIAGNOSIS — I951 Orthostatic hypotension: Secondary | ICD-10-CM

## 2020-08-03 NOTE — Telephone Encounter (Cosign Needed)
  Chronic Care Management   Outreach Note  08/03/2020 Name: Dylan [name]. MRN: [id number] DOB: Nov 13, 1940  Referred by: [name], MD Reason for referral : Chronic Care Management (Inbound call from spouse [name])   Voice message received from spouse [name] requesting a return call to discuss getting a lift to help her transfer and lift the patient. She is also interested in learning about in home respite services. The patient was referred to the case management team for assistance with care management and care coordination.   Follow Up Plan: Telephone follow up appointment with care management team member scheduled for: 08/04/20  [name], RN, BSN, CCM Care Management Coordinator [facility]  Direct Phone: [phone]

## 2020-08-04 ENCOUNTER — Other Ambulatory Visit: Payer: Self-pay

## 2020-08-04 ENCOUNTER — Telehealth: Payer: Medicare HMO

## 2020-08-04 ENCOUNTER — Other Ambulatory Visit: Payer: Medicare HMO | Admitting: Nurse Practitioner

## 2020-08-04 ENCOUNTER — Encounter: Payer: Self-pay | Admitting: Nurse Practitioner

## 2020-08-04 DIAGNOSIS — F0391 Unspecified dementia with behavioral disturbance: Secondary | ICD-10-CM

## 2020-08-04 DIAGNOSIS — Z515 Encounter for palliative care: Secondary | ICD-10-CM

## 2020-08-04 DIAGNOSIS — G20 Parkinson's disease: Secondary | ICD-10-CM

## 2020-08-04 DIAGNOSIS — F0281 Dementia in other diseases classified elsewhere with behavioral disturbance: Secondary | ICD-10-CM

## 2020-08-04 DIAGNOSIS — I951 Orthostatic hypotension: Secondary | ICD-10-CM

## 2020-08-04 NOTE — Progress Notes (Signed)
[facility] Consult Note Telephone: [phone]  Fax: [phone]  PATIENT NAME: Dylan [name]. DOB: 04-24-1941 MRN: [id number]  PRIMARY CARE PROVIDER:   [name], MD  REFERRING PROVIDER:  [name], MD [address] [place],  [place] [address]  RESPONSIBLE PARTY:  Self; wife Dylan [name] RECOMMENDATIONS [name] PLAN: 1.ACP: DNR [name] MOST form completed, placed in [facility]yn[facility]. Wishes are for Limited interventions, no feeding tube but wishes are for antibiotic, IVF  2.Palliative care encounter; Palliative medicine team will continue to support patient, patient's family, [name] medical team. Visit consisted of counseling [name] education dealing with the complex [name] emotionally intense issues of symptom management [name] palliative care in the setting of serious [name] potentially life-threatening illness  3. F/u visit 2 months if needed or sooner if declines for monitoring for decline, weight with Parkinson, dementia I spent 60 minutes providing this consultation,  from 10:00am to 11:00am. More than 50% of the time in this consultation was spent coordinating communication.   HISTORY OF PRESENT ILLNESS:  Dylan [name]. is a 79 y.o. year old male with multiple medical problems including Parkinson disease, dementia, Aortic stenosis, complete heart block s/p pacemaker, peripheral vascular disease, hypertension, diabetes, neuropathy, chronic kidney disease, gout, vitamin b deficiency, vitamin D deficiency, anxiety, lipoma sgy, kyphoplasty, eye sgy. 04/27/2020 to 05/11/2020 fall secondary orthostatic hypotension, dehydration. Follow up palliative care visit from Dylan [name] [name] his wife present. Dylan [name] I talked about purpose of palliative care visit [name] in agreement. We talked about how he was feeling. Dylan [name] mobility has been increasing. Dylan [name] endorses Dylan [name] is able to walk steps with his walker [name] the kitchen is for  distances. Dylan [name] has difficulty getting up to a standing position on his own from a chair. He does require assistance for adl's including dressing. Dylan [name] endorses physical therapy has continued. Dylan [name] does use a pedal exercise in addition to weights [name] bands daily. Dylan. [name] endorses "you have to build your muscles in order to get better". We talked about symptoms of pain which he endorses he is comfortable. We talked about shortness of breath which he denies. We talked about his daily routine which consists mostly of watching TV. We talked about his appetite which is good does not appear that he is losing weight. Dylan [name] did ask about a lift if needed [name] was working on that for then. We talked about respite care in the home. Dylan [name] endorses she did take him to a Skilled Nursing facility for respite care which was extremely expensive when she had to go out of town. Dylan [name] endorses they still have a home in [name]. Dylan [name] endorses they moved to [place] to be closer to their daughter who lives about 20 minutes away. Dylan [name] endorses Dylan [name] has three children that continue to live in [place]. We talked about in-home resources as he has Medicare would be private pay for sitter service. What pain a list of agencies [name] email to Dylan [name]. Dylan [name] talked about her health for what she does have some limitations. We talked about sleep, Dylan [name] has been sleeping in a hospital bed. Dylan [name] endorses that she has difficulty sleeping in the same room as his mattress is very soft. Her room is on the other side of the house with a very firm mattress but she has been sleeping in the same room  with him [name] develop some pain issues. We talked about getting a baby monitor to see if that would be helpful where she could sleep in her own bed in here. Talked about caregiver stress [name] fatigue, coping strategies. We talked  about role of palliative care [name] plan of care. We talked about palliative care visit in line. Dylan [name] Dylan [name] in agreement, appointments scheduled. Therapeutic listening [name] emotional support provided. Contact information provided. Questions answered to satisfaction.  Palliative Care was asked to help to continue to address goals of care.   CODE STATUS: DNR  PPS: 50% HOSPICE ELIGIBILITY/DIAGNOSIS: TBD  PAST MEDICAL HISTORY:  Past Medical History:  Diagnosis Date  . Anxiety   . Aortic stenosis    mild AS 11/24/16 (peak grad 24, mean grad 11) Dr. [name]  . CHB (complete heart block) ([place]) 07/2017  . 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[place]) 07/11/2017  . Diabetes mellitus without complication ([place])   . Encounter for care of pacemaker 07/11/2020  . Gait abnormality   . Gout   . Hyperlipemia   . Hypertension   . Hypertensive heart [name] renal disease 06/22/2018  . Memory loss   . Pacemaker Dual chamber [place] MRI  model [id number] 07/11/2020 06/08/2020  . Peripheral arterial disease ([place])   . 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 Second degree AV block    Wenckebach; no indication for pacemaker as of 12/01/16 (Dr. [name])  . Vitamin B12 deficiency anemia 03/01/2018  . Vitamin D deficiency disease     SOCIAL HX:  Social History   Tobacco Use  . Smoking status: Former Smoker    Packs/day: 0.50    Years: 7.00    Pack years: 3.50    Types: Cigarettes  . Smokeless tobacco: Never Used  . Tobacco comment: quit 35 years  Substance Use Topics  . Alcohol use: No    ALLERGIES: No Known Allergies   PERTINENT MEDICATIONS:  Outpatient Encounter Medications as of 08/04/2020  Medication Sig  . Ascorbic Acid (VITAMIN C) 1000 MG tablet Take 1,000 mg by mouth every morning.   . carbidopa-levodopa (SINEMET IR) 25-100 MG tablet Take 2 tablets by mouth 3 (three) times daily.  . carvedilol (COREG) 12.5 MG tablet TAKE 1 TABLET BY  MOUTH 2 TIMES DAILY.  [name] Cholecalciferol (VITAMIN D3) 5000 units CAPS Take 5,000 Units by mouth daily with supper.   . COLCRYS 0.6 MG tablet Take 1 tablet (0.6 mg total) by mouth daily. (Patient taking differently: Take 0.6 mg by mouth daily as needed. )  . desonide (DESOWEN) 0.05 % lotion Apply topically 2 times per day prn  . diclofenac Sodium (VOLTAREN) 1 % GEL Apply 2 g topically 3 (three) times daily. (Patient taking differently: Apply 2 g topically as needed. )  . 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 Lancet Devices (ONE TOUCH DELICA LANCING DEV) MISC Please fill ONE TOUCH DELICA LANCING DEVICE.  Use to test blood sugar twice daily as directed. E11.65  . Lancets MISC Please fill basic/generic push button lancets.  Patient to test blood sugar twice daily. DX: E11.65  . Melatonin 10 MG TABS Take 10 mg by mouth at bedtime.   . memantine (NAMENDA) 10 MG tablet TAKE 1 TABLET BY MOUTH TWICE A DAY  . metFORMIN (GLUCOPHAGE) 1000 MG tablet TAKE 1 TABLET BY MOUTH EVERY DAY (Patient taking differently: Take  1,000 mg by mouth every morning. )  . pravastatin (PRAVACHOL) 20 MG tablet TAKE 1 TABLET BY ORAL ROUTE EVERY DAY  . QUEtiapine (SEROQUEL) 25 MG tablet Take 1 tablet (25 mg total) by mouth at bedtime as needed.  . rivastigmine (EXELON) 9.5 mg/24hr Place 1 patch (9.5 mg total) onto the skin daily.  . TRULICITY 1.5 WL/7.9GX SOPN INJECT 1.5 MG INTO THE SKIN ONCE A WEEK. (Patient taking differently: Inject 1.5 mg into the skin every Monday. )   No facility-administered encounter medications on file as of 08/04/2020.    PHYSICAL EXAM:   General: debilitated, pleasant male Cardiovascular: regular rate [name] rhythm Pulmonary: clear ant fields Neurological: generalized weakness; walks with walker rolleraid  Kensleigh Gates Ihor Gully, NP

## 2020-08-05 ENCOUNTER — Ambulatory Visit: Payer: Self-pay

## 2020-08-05 ENCOUNTER — Other Ambulatory Visit: Payer: Self-pay | Admitting: Internal Medicine

## 2020-08-05 ENCOUNTER — Ambulatory Visit: Payer: Medicare HMO

## 2020-08-05 ENCOUNTER — Telehealth: Payer: Medicare HMO

## 2020-08-05 ENCOUNTER — Other Ambulatory Visit: Payer: Self-pay

## 2020-08-05 DIAGNOSIS — G20 Parkinson's disease: Secondary | ICD-10-CM

## 2020-08-05 DIAGNOSIS — E1122 Type 2 diabetes mellitus with diabetic chronic kidney disease: Secondary | ICD-10-CM

## 2020-08-05 DIAGNOSIS — N183 Chronic kidney disease, stage 3 unspecified: Secondary | ICD-10-CM

## 2020-08-05 DIAGNOSIS — N1831 Chronic kidney disease, stage 3a: Secondary | ICD-10-CM

## 2020-08-05 DIAGNOSIS — G20C Parkinsonism, unspecified: Secondary | ICD-10-CM

## 2020-08-05 DIAGNOSIS — F0391 Unspecified dementia with behavioral disturbance: Secondary | ICD-10-CM

## 2020-08-05 DIAGNOSIS — I10 Essential (primary) hypertension: Secondary | ICD-10-CM

## 2020-08-05 NOTE — Patient Instructions (Signed)
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  Inability to remain in the home alone  Chronic conditions including DM II, CKD III, Dementia, and Parkinsonism which put patient at increased risk for hospitalization  Social Work Clinical Goal(s):   Over the next 90 days the patient and his caregiver will work with SW to identify respite resources  CCM SW Interventions: Completed 08/05/20  Collaboration with [place] who reports a voice message was received from Mrs Arsenio Loader requesting in home caregiver resources  Mailed in home caregiver resources to Mrs Arsenio Loader along with respite care information  Completed 07/31/20 with [place] care team collaboration (see longitudinal plan of care)  Successful outbound call placed to [name] to assist with care coordination needs o Mrs Arsenio Loader reports she is interested in more affordable respite options to assist for hours/day/weekends when she needs assistance caring for the patient  Educated Mrs Arsenio Loader on in home caregivers options to assist with a few hours when running errands o Mrs Arsenio Loader reports she is not willing to have a caregiver in the home while she is not present and declined this resource option  Educated Mrs Arsenio Loader on adult day program offered by [employer] for daily care o Advised Mrs Arsenio Loader this is a resource she would have to use and/or pay for consistently for staffing purposes o Mailed information on this resource for review  Discussed for weekend respite care she would need to look at ALF or SNF for out of home care o The  patient has already accessed respite at [facility] several times; Mrs Arsenio Loader would like an option closer to home o Mailed a list of Assisted Living Facilities in [place] for review o Advised Mrs Colbert Ewing is not clear on each ALF's respite guidelines at this time  Scheduled follow up over the next month  Patient Self Care Activities: With the help of Mrs Arsenio Loader   Self administers medications as prescribed  Attends all scheduled provider appointments  Calls provider office for new concerns or questions  Supportive caregiver to assist with patient care needs  Initial goal documentation       Other     "His balance is much worse and he needs a walker"        CARE PLAN ENTRY (see longitudinal plan of care for additional care plan information)  Spouse states Current Barriers:   Impaired gait/balance  High Risk for Falls  Newly diagnosed; Mild cortical atrophy mostly in the mesial temporal lobes and parietal lobes.  Newly diagnosed; Mild chronic microvascular ischemic changes  Nurse Case Manager Clinical Goal(s):   Over the next 30 days, patient will work with the CCM team to address needs related to impaired gait disturbance and high risk for falls Goal Met   08/02/19 Over the next 30 days, patient will complete outpatient PT and will be able to adhere to his prescribed HEP Goal Partially Met  New 03/12/20 Over the next 90 days, caregiver will confirm patient is using his DME as directed by MD and  PCP to assist with mobility and gait AS EVIDENCE BY patient will experience improved mobility w/o falls or injury related to falls  CCM SW Interventions: Completed 08/05/20  Collaboration with [place] who reports she does not have literature to send [name] at this time regarding body mechanics with tranfers o RN CM reports Mrs Arsenio Loader and the patient worked with physical therapy to review these techniques in the past  Collaboration with Dr.  Baird Cancer to review Mrs Alonna Buckler request o [name] reports she does not have literature to share at this time as Mrs Arsenio Loader does not need to be lifting the patient o Discussed SW plan to mail caregiver resource information to the patients home  Completed 07/31/20 with [name]  Successful outbound call placed to Mrs Arsenio Loader to assist with care coordination needs  Determined she is interested in literature outlining how to safely transfer a person as her back is beginning to hurt  Collaboration with RN Care Manager to obtain information to send Mrs Arsenio Loader  CCM RN CM Interventions:  06/09/20 call completed with spouse Ancil Linsey care team collaboration (see longitudinal plan of care)  Evaluation of current treatment plan related to Impaired Physical Mobility and patient's adherence to plan as established by provider  Determined patient has been d/c home from in patient rehabilitation and is currently receiving in home PT/OT  Determined patient has a semi-electric hospital bed and all other DME needed at this time  Discussed spouse's concerns that patient has exited the bed w/o her assistance and she is fearful he will have a fall  Provided spouse with the contact information for a chair exit alarm; discussed how this home safety device works; discussed this safety device is not covered by Medicare and will have to be paid for out of pocket; discussed DME suppliers in the area and provided spouse with the contact numbers and address's  Determined patient has not experienced any falls since being discharged home from the hospital  Discussed plans with patient for ongoing care management follow up and provided patient with direct contact information for care management team  Patient Self Care Activities:   Attends all scheduled provider appointments  Currently UNABLE TO independently perform self-care without assistance   Supportive caregiver, spouse [name]  to assist with caregiver needs  Please see past updates related to this goal by clicking on the "Past Updates" button in the selected goal          Follow Up Plan: SW will follow up with patient by phone over the next three weeks   Daneen Schick, BSW, CDP Social Worker, Certified Dementia Practitioner [place] [facility] [place] [facility] [phone]

## 2020-08-05 NOTE — Chronic Care Management (AMB) (Signed)
Chronic Care Management    Social Work Follow Up Note  08/05/2020 Name: Hampton Wixom. MRN: [id number] DOB: 02/08/1941  Lavaughn Haberle. is a 79 y.o. year old male who is a primary care patient of [name], MD. The CCM team was consulted for assistance with care coordination.   Review of patient status, including review of consultants reports, other relevant assessments, and collaboration with appropriate care team members and the patient's provider was performed as part of comprehensive patient evaluation and provision of chronic care management services.    SDOH (Social Determinants of Health) assessments performed: No    Outpatient Encounter Medications as of 08/05/2020  Medication Sig  . Ascorbic Acid (VITAMIN C) 1000 MG tablet Take 1,000 mg by mouth every morning.   . carbidopa-levodopa (SINEMET IR) 25-100 MG tablet Take 2 tablets by mouth 3 (three) times daily.  . carvedilol (COREG) 12.5 MG tablet TAKE 1 TABLET BY MOUTH 2 TIMES DAILY.  [name] Cholecalciferol (VITAMIN D3) 5000 units CAPS Take 5,000 Units by mouth daily with supper.   . COLCRYS 0.6 MG tablet Take 1 tablet (0.6 mg total) by mouth daily. (Patient taking differently: Take 0.6 mg by mouth daily as needed. )  . desonide (DESOWEN) 0.05 % lotion Apply topically 2 times per day prn  . diclofenac Sodium (VOLTAREN) 1 % GEL Apply 2 g topically 3 (three) times daily. (Patient taking differently: Apply 2 g topically as needed. )  . 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 Lancet Devices (ONE TOUCH DELICA LANCING DEV) MISC Please fill ONE TOUCH DELICA LANCING DEVICE.  Use to test blood sugar twice daily as directed. E11.65  . Lancets MISC Please fill basic/generic push button lancets.  Patient to test blood sugar twice daily. DX: E11.65  . Melatonin 10 MG TABS Take 10 mg by mouth at bedtime.   . memantine (NAMENDA) 10 MG tablet TAKE 1 TABLET BY MOUTH TWICE A DAY  .  metFORMIN (GLUCOPHAGE) 1000 MG tablet TAKE 1 TABLET BY MOUTH EVERY DAY  . pravastatin (PRAVACHOL) 20 MG tablet TAKE 1 TABLET BY ORAL ROUTE EVERY DAY  . QUEtiapine (SEROQUEL) 25 MG tablet Take 1 tablet (25 mg total) by mouth at bedtime as needed.  . rivastigmine (EXELON) 9.5 mg/24hr Place 1 patch (9.5 mg total) onto the skin daily.  . TRULICITY 1.5 ZR/0.0TM SOPN INJECT 1.5 MG INTO THE SKIN ONCE A WEEK. (Patient taking differently: Inject 1.5 mg into the skin every Monday. )   No facility-administered encounter medications on file as of 08/05/2020.     Goals Addressed              This Visit's Progress     Patient Stated   .  "I would like some respite resources" (pt-stated)        Caregiver stated:  CARE PLAN ENTRY (see longitudinal plan of care for additional care plan information)  Current Barriers:  . 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 Inability to remain in the home alone . Chronic conditions including DM II, CKD III, Dementia, and Parkinsonism which put patient at increased risk for hospitalization  Social Work Clinical Goal(s):  [name] Over the next 90 days the patient and his caregiver will work with SW to identify respite resources  CCM SW Interventions: Completed 08/05/20 . Collaboration with [place] who reports a voice message was received from Mrs Arsenio Loader requesting in home  caregiver resources . Mailed in home caregiver resources to Mrs Arsenio Loader along with respite care information  Completed 07/31/20 with [name] Inter-disciplinary care team collaboration (see longitudinal plan of care) . Successful outbound call placed to [name] to assist with care coordination needs o Mrs Arsenio Loader reports she is interested in more affordable respite options to assist for hours/day/weekends when she needs assistance caring for the patient . Educated Mrs Arsenio Loader on in home caregivers options to assist with a few hours when running  errands o Mrs Arsenio Loader reports she is not willing to have a caregiver in the home while she is not present and declined this resource option . Educated Mrs Arsenio Loader on adult day program offered by [employer] for daily care o Advised Mrs Arsenio Loader this is a resource she would have to use and/or pay for consistently for staffing purposes o Mailed information on this resource for review . Discussed for weekend respite care she would need to look at ALF or SNF for out of home care o The patient has already accessed respite at [facility] several times; Mrs Arsenio Loader would like an option closer to home o Mailed a list of Assisted Living Facilities in [place] for review o Advised Mrs Colbert Ewing is not clear on each ALF's respite guidelines at this time . Scheduled follow up over the next month  Patient Self Care Activities: With the help of Mrs Arsenio Loader  . Self administers medications as prescribed . Attends all scheduled provider appointments . Calls provider office for new concerns or questions . Supportive caregiver to assist with patient care needs  Initial goal documentation       Other   .  "His balance is much worse and he needs a walker"        CARE PLAN ENTRY (see longitudinal plan of care for additional care plan information)  Spouse states Current Barriers:  . Impaired gait/balance . High Risk for Falls . 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 . Newly diagnosed; Mild chronic microvascular ischemic changes  Nurse Case Manager Clinical Goal(s):  [name] Over the next 30 days, patient will work with the CCM team to address needs related to impaired gait disturbance and high risk for falls Goal Met  . 08/02/19 Over the next 30 days, patient will complete outpatient PT and will be able to adhere to his prescribed HEP Goal Partially Met . New 03/12/20 Over the next 90 days, caregiver will confirm patient is using his DME as  directed by MD and PCP to assist with mobility and gait AS EVIDENCE BY patient will experience improved mobility w/o falls or injury related to falls  CCM SW Interventions: Completed 08/05/20 . Collaboration with [place] who reports she does not have literature to send [name] at this time regarding body mechanics with tranfers o RN CM reports Mrs Arsenio Loader and the patient worked with physical therapy to review these techniques in the past . Collaboration with [name] to review Mrs Alonna Buckler request o [name] reports she does not have literature to share at this time as Mrs Arsenio Loader does not need to be lifting the patient o Discussed SW plan to mail caregiver resource information to the patients home  Completed 07/31/20 with [name] . Successful outbound call placed to Mrs Arsenio Loader to assist with care coordination needs . Determined she is interested in literature outlining how to safely transfer a person as her  back is beginning to hurt . Collaboration with RN Care Manager to obtain information to send Mrs Arsenio Loader  CCM RN CM Interventions:  06/09/20 call completed with spouse [name]  . Inter-disciplinary care team collaboration (see longitudinal plan of care) . Evaluation of current treatment plan related to Impaired Physical Mobility and patient's adherence to plan as established by provider . Determined patient has been d/c home from in patient rehabilitation and is currently receiving in home PT/OT . Determined patient has a semi-electric hospital bed and all other DME needed at this time . Discussed spouse's concerns that patient has exited the bed w/o her assistance and she is fearful he will have a fall . Provided spouse with the contact information for a chair exit alarm; discussed how this home safety device works; discussed this safety device is not covered by Medicare and will have to be paid for out of pocket; discussed DME suppliers in the area and  provided spouse with the contact numbers and address's . Determined patient has not experienced any falls since being discharged home from the hospital . Discussed plans with patient for ongoing care management follow up and provided patient with direct contact information for care management team  Patient Self Care Activities:  . Attends all scheduled provider appointments . Currently UNABLE TO independently perform self-care without assistance  . Supportive caregiver, spouse [name] to assist with caregiver needs  Please see past updates related to this goal by clicking on the "Past Updates" button in the selected goal          Follow Up Plan: SW will follow up with patient by phone over the next three weeks   Daneen Schick, BSW, CDP Social Worker, Certified Dementia Practitioner [place] [facility] [place] [facility] [phone]  Total time spent performing care coordination and/or care management activities with the patient by phone or face to face = 10 minutes.

## 2020-08-09 ENCOUNTER — Other Ambulatory Visit: Payer: Self-pay

## 2020-08-09 ENCOUNTER — Ambulatory Visit (HOSPITAL_COMMUNITY)
Admission: EM | Admit: 2020-08-09 | Discharge: 2020-08-09 | Disposition: A | Discharge status: Home / Self Care | Payer: Medicare HMO | Attending: Family Medicine | Admitting: Family Medicine

## 2020-08-09 ENCOUNTER — Ambulatory Visit (INDEPENDENT_AMBULATORY_CARE_PROVIDER_SITE_OTHER): Payer: Medicare HMO

## 2020-08-09 ENCOUNTER — Encounter (HOSPITAL_COMMUNITY): Payer: Self-pay

## 2020-08-09 DIAGNOSIS — W07XXXA Fall from chair, initial encounter: Secondary | ICD-10-CM

## 2020-08-09 DIAGNOSIS — S5002XA Contusion of left elbow, initial encounter: Secondary | ICD-10-CM | POA: Diagnosis not present

## 2020-08-09 DIAGNOSIS — S59902A Unspecified injury of left elbow, initial encounter: Secondary | ICD-10-CM

## 2020-08-09 NOTE — ED Provider Notes (Signed)
[facility]    CSN: [id number] Arrival date & time: 08/09/20  1500      History   Chief Complaint Chief Complaint  Patient presents with  . Elbow Pain    HPI Dylan Searls. is a 79 y.o. male.   HPI  Pleasant 79 year old gentleman.  He is here with his wife.  She is his primary caregiver.  He has Parkinson's disease with dementia.  Difficulty with gait.  Is very stiff.  Also has a history of chronic kidney disease, hypertension hyperlipidemia gout, heart disease and memory impairment. He was sitting in a wheelchair at home.  He fell backwards and hit his elbow on the arm one edge of the chair.  Ever since that he has been complaining of left arm pain and has some swelling in his elbow.  He has reduced use of this arm.  No other injuries from the fall. Patient states he also has a headache.  Wife states this is for she is here to the headache.  The fall was 2 days ago.   Past Medical History:  Diagnosis Date  . Anxiety   . Aortic stenosis    mild AS 11/24/16 (peak grad 24, mean grad 11) Dr. [name]  . CHB (complete heart block) ([place]) 07/2017  . 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 Diabetes mellitus without complication ([place])   . Encounter for care of pacemaker 07/11/2020  . Gait abnormality   . Gout   . Hyperlipemia   . Hypertension   . 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 Memory loss   . Pacemaker Dual chamber [place] MRI  model [id number] 07/11/2020 06/08/2020  . Peripheral arterial disease ([place])   . 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 Second degree AV block    Wenckebach; no indication for pacemaker as of 12/01/16 (Dr. [name])  . Vitamin B12 deficiency anemia 03/01/2018  . Vitamin D deficiency disease     Patient Active Problem List   Diagnosis Date Noted  . Encounter for care of pacemaker 07/11/2020  . Pacemaker Dual chamber [place] MRI   model [id number] 07/11/2020 06/08/2020  . Neurogenic orthostatic hypotension ([place]) 06/08/2020  . DNR (do not resuscitate) 06/08/2020  . Elevated CK 04/27/2020  . Gout   . Hyperlipemia   . Parkinsonism ([place]) 01/29/2020  . 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 Dementia with behavioral disturbance ([place]) 05/22/2019  . Gait abnormality 05/22/2019  . AKI (acute kidney injury) ([place]) 07/14/2018  . Nephropathy 07/14/2018  . Other disorders of lung 07/14/2018  . Atherosclerosis of aorta ([place]) 07/14/2018  . Dehydration 07/14/2018  . 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 CKD (chronic kidney disease), stage III ([place]) 06/13/2018  . Syncope 06/13/2018  . 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[place]) 06/23/2015  . Peripheral arterial disease ([place]) 05/13/2014  . Essential hypertension 05/13/2014  . Type 2 diabetes mellitus with stage 3a chronic kidney disease, without long-term current use of insulin ([place]) 05/13/2014    Past Surgical History:  Procedure Laterality Date  . CARDIOVASCULAR STRESS TEST     11/21/16 Low risk study, EF 52% [facility])  . EYE SURGERY    . KYPHOPLASTY N/A 02/15/2017   Procedure: LUMBAR FOUR KYPHOPLASTY;  Surgeon: Phylliss Bob, MD;  Location: [facility];  Service: Orthopedics;  Laterality: N/A;  . lipoma surgery     neck -  30 years ago  . PACEMAKER IMPLANT N/A 07/11/2017   Procedure: Pacemaker Implant;  Surgeon: Constance Haw, MD;  Location: [facility];  Service: Cardiovascular;  Laterality: N/A;  . TRANSTHORACIC ECHOCARDIOGRAM     11/24/16 [facility]): EF 55-60%, mild AS, mild-mod MR, mod TR, moderate pulm HTN, PAP 49 mmHg       Home Medications    Prior to Admission medications   Medication Sig Start Date End Date Taking? Authorizing Provider  Ascorbic Acid (VITAMIN C) 1000 MG tablet Take 1,000 mg by mouth every morning.     [provider]  carbidopa-levodopa (SINEMET IR) 25-100 MG  tablet Take 2 tablets by mouth 3 (three) times daily. 07/27/20   Marcial Pacas, MD  carvedilol (COREG) 12.5 MG tablet TAKE 1 TABLET BY MOUTH 2 TIMES DAILY. 07/07/20   [name], MD  Cholecalciferol (VITAMIN D3) 5000 units CAPS Take 5,000 Units by mouth daily with supper.     [provider]  COLCRYS 0.6 MG tablet Take 1 tablet (0.6 mg total) by mouth daily. Patient taking differently: Take 0.6 mg by mouth daily as needed.  10/12/18   [name], MD  desonide (DESOWEN) 0.05 % lotion Apply topically 2 times per day prn 06/17/20   [name], MD  diclofenac Sodium (VOLTAREN) 1 % GEL Apply 2 g topically 3 (three) times daily. Patient taking differently: Apply 2 g topically as needed.  10/10/19   Rodrig[place], Sunday Spillers, PA-C  glucose blood (ONETOUCH VERIO) test strip Use as instructed to check blood sugars daily dx: e11.22 01/06/20   [name], MD  Lancet Devices (ONE TOUCH DELICA LANCING DEV) MISC Please fill ONE TOUCH DELICA LANCING DEVICE.  Use to test blood sugar twice daily as directed. E11.65 11/15/19   [name], MD  Lancets MISC Please fill basic/generic push button lancets.  Patient to test blood sugar twice daily. DX: E11.65 12/06/19   [name], MD  Melatonin 10 MG TABS Take 10 mg by mouth at bedtime.     [provider]  memantine (NAMENDA) 10 MG tablet TAKE 1 TABLET BY MOUTH TWICE A DAY 07/07/20   [name], MD  metFORMIN (GLUCOPHAGE) 1000 MG tablet TAKE 1 TABLET BY MOUTH EVERY DAY 08/05/20   [name], MD  pravastatin (PRAVACHOL) 20 MG tablet TAKE 1 TABLET BY ORAL ROUTE EVERY DAY 06/05/20   [name], MD  QUEtiapine (SEROQUEL) 25 MG tablet Take 1 tablet (25 mg total) by mouth at bedtime as needed. 07/27/20   Marcial Pacas, MD  rivastigmine (EXELON) 9.5 mg/24hr Place 1 patch (9.5 mg total) onto the skin daily. 07/27/20   Marcial Pacas, MD  TRULICITY 1.5 DT/2.6ZT SOPN INJECT 1.5 MG INTO THE SKIN ONCE A WEEK. Patient taking differently: Inject 1.5 mg  into the skin every Monday.  09/10/19   [name], MD    Family History Family History  Problem Relation Age of Onset  . Diabetes Mother   . Breast cancer Mother   . Arthritis Mother   . Hypertension Father   . Diabetes Sister   . Diabetes Brother   . Diabetes Maternal Grandmother   . Diabetes Brother   . Diabetes Brother   . Diabetes Brother   . Diabetes Sister   . Diabetes Sister   . Diabetes Sister     Social History Social History   Tobacco Use  . Smoking status: Former Smoker    Packs/day: 0.50    Years: 7.00    Pack  years: 3.50    Types: Cigarettes  . Smokeless tobacco: Never Used  . Tobacco comment: quit 35 years  Vaping Use  . Vaping Use: Never used  Substance Use Topics  . Alcohol use: No  . Drug use: No     Allergies   Patient has no known allergies.   Review of Systems Review of Systems  See HPI Physical Exam Triage Vital Signs ED Triage Vitals [08/09/20 1614]  Enc Vitals Group     BP (!) 146/62     Pulse Rate 76     Resp 18     Temp 98.1 F (36.7 C)     Temp Source Oral     SpO2 100 %     Weight      Height      Head Circumference      Peak Flow      Pain Score      Pain Loc      Pain Edu?      Excl. in [place]?    No data found.  Updated Vital Signs BP (!) 146/62 (BP Location: Right Arm)   Pulse 76   Temp 98.1 F (36.7 C) (Oral)   Resp 18   SpO2 100%      Physical Exam Constitutional:      General: He is not in acute distress.    Appearance: He is well-developed.     Comments: Patient is lean.  Little facial expression.  Little vocalization.  Very stiff  HENT:     Head: Normocephalic and atraumatic.     Mouth/Throat:     Comments: Mask is in place Eyes:     Conjunctiva/sclera: Conjunctivae normal.     Pupils: Pupils are equal, round, and reactive to light.  Cardiovascular:     Rate and Rhythm: Normal rate.  Pulmonary:     Effort: Pulmonary effort is normal. No respiratory distress.  Abdominal:     General:  There is no distension.     Palpations: Abdomen is soft.  Musculoskeletal:        General: Normal range of motion.     Cervical back: Normal range of motion.     Comments: Tip of olecranon process has mild soft tissue swelling and tenderness.  Skin:    General: Skin is warm and dry.  Neurological:     Mental Status: He is alert.     Motor: Weakness present.     Gait: Gait abnormal.     Comments: Stiff range of motion of upper extremities, limited movement.  Psychiatric:        Mood and Affect: Mood normal.        Behavior: Behavior normal.      UC Treatments / Results  Labs (all labs ordered are listed, but only abnormal results are displayed) Labs Reviewed - No data to display  EKG   Radiology DG Elbow Complete Left  Result Date: 08/09/2020 CLINICAL DATA:  79 year old male with fall and trauma to the left elbow. EXAM: LEFT ELBOW - COMPLETE 3+ VIEW COMPARISON:  None. FINDINGS: There is no acute fracture or dislocation. The bones are mildly osteopenic. Spurring noted along the dorsal aspect of the olecranon. No joint effusion. The soft tissues are unremarkable. IMPRESSION: No acute fracture or dislocation. Electronically Signed   By: Anner Crete M.D.   On: 08/09/2020 18:02    Procedures Procedures (including critical care time)  Medications Ordered in UC Medications - No data to display  Initial  Impression / Assessment and Plan / UC Course  I have reviewed the triage vital signs and the nursing notes.  Pertinent labs & imaging results that were available during my care of the patient were reviewed by me and considered in my medical decision making (see chart for details).   Final Clinical Impressions(s) / UC Diagnoses   Final diagnoses:  Contusion of left elbow, initial encounter  Accidental fall from chair, initial encounter     Discharge Instructions     We will call you with your x-ray report as soon as it is available I do not see any broken bones  only arthritis in the elbow Take ibuprofen or Aleve as needed for pain See your primary care doctor if not improving in a few days    ED Prescriptions    None     PDMP not reviewed this encounter.   Raylene Everts, MD 08/09/20 [phone]

## 2020-08-09 NOTE — ED Notes (Signed)
Graham crackers and diet soda given for comfort, updated pt and wife that awaiting xray availability; est time for xray approx 15 minutes

## 2020-08-09 NOTE — ED Triage Notes (Signed)
Pt presents with swelling and pin in left elbow x 2 days. Pt reports he was trying to sit in the wheel chair and hit left elbow with the wheelchair 2 days ago.

## 2020-08-09 NOTE — Discharge Instructions (Addendum)
We will call you with your x-ray report as soon as it is available I do not see any broken bones only arthritis in the elbow Take ibuprofen or Aleve as needed for pain See your primary care doctor if not improving in a few days

## 2020-08-10 NOTE — Chronic Care Management (AMB) (Signed)
Chronic Care Management   Follow Up Note   08/05/2020 Name: Dylan [name]. MRN: [id number] DOB: 04-05-1941  Referred by: [name], MD Reason for referral : Chronic Care Management (CCM RN CM FU Call )   Dylan [name]. is a 79 y.o. year old male who is a primary care patient of [name], MD. The CCM team was consulted for assistance with chronic disease management and care coordination needs.    Review of patient status, including review of consultants reports, relevant laboratory and other test results, and collaboration with appropriate care team members and the patient's provider was performed as part of comprehensive patient evaluation and provision of chronic care management services.    SDOH (Social Determinants of Health) assessments performed: Yes See Care Plan activities for detailed interventions related to [place])   Placed outbound CCM RN CM call to patient for a care plan update.     Outpatient Encounter Medications as of 08/05/2020  Medication Sig  . Ascorbic Acid (VITAMIN C) 1000 MG tablet Take 1,000 mg by mouth every morning.   . carbidopa-levodopa (SINEMET IR) 25-100 MG tablet Take 2 tablets by mouth 3 (three) times daily.  . carvedilol (COREG) 12.5 MG tablet TAKE 1 TABLET BY MOUTH 2 TIMES DAILY.  [name] Cholecalciferol (VITAMIN D3) 5000 units CAPS Take 5,000 Units by mouth daily with supper.   . COLCRYS 0.6 MG tablet Take 1 tablet (0.6 mg total) by mouth daily. (Patient taking differently: Take 0.6 mg by mouth daily as needed. )  . desonide (DESOWEN) 0.05 % lotion Apply topically 2 times per day prn  . diclofenac Sodium (VOLTAREN) 1 % GEL Apply 2 g topically 3 (three) times daily. (Patient taking differently: Apply 2 g topically as needed. )  . 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 Lancet Devices (ONE TOUCH DELICA LANCING DEV) MISC Please fill ONE TOUCH DELICA LANCING DEVICE.  Use to test blood  sugar twice daily as directed. E11.65  . Lancets MISC Please fill basic/generic push button lancets.  Patient to test blood sugar twice daily. DX: E11.65  . Melatonin 10 MG TABS Take 10 mg by mouth at bedtime.   . memantine (NAMENDA) 10 MG tablet TAKE 1 TABLET BY MOUTH TWICE A DAY  . metFORMIN (GLUCOPHAGE) 1000 MG tablet TAKE 1 TABLET BY MOUTH EVERY DAY  . pravastatin (PRAVACHOL) 20 MG tablet TAKE 1 TABLET BY ORAL ROUTE EVERY DAY  . QUEtiapine (SEROQUEL) 25 MG tablet Take 1 tablet (25 mg total) by mouth at bedtime as needed.  . rivastigmine (EXELON) 9.5 mg/24hr Place 1 patch (9.5 mg total) onto the skin daily.  . TRULICITY 1.5 DT/2.6ZT SOPN INJECT 1.5 MG INTO THE SKIN ONCE A WEEK. (Patient taking differently: Inject 1.5 mg into the skin every Monday. )   No facility-administered encounter medications on file as of 08/05/2020.     Objective:  Lab Results  Component Value Date   HGBA1C 7.0 (H) 06/03/2020   HGBA1C 7.5 (H) 03/24/2020   HGBA1C 8.1 (H) 12/23/2019   Lab Results  Component Value Date   MICROALBUR 80 01/22/2019   LDLCALC 66 07/24/2019   CREATININE 1.17 05/11/2020   BP Readings from Last 3 Encounters:  08/09/20 (!) 146/62  07/29/20 130/74  07/27/20 (!) 143/66    Goals Addressed      Patient Stated   .  "I would like some respite resources" (pt-stated)  Caregiver stated:  CARE PLAN ENTRY (see longitudinal plan of care for additional care plan information)  Current Barriers:  . 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 Inability to remain in the home alone . Chronic conditions including DM II, CKD III, Dementia, and Parkinsonism which put patient at increased risk for hospitalization  Social Work Clinical Goal(s):  [name] Over the next 90 days the patient and his caregiver will work with SW to identify respite resources  CCM SW Interventions: Completed 08/05/20 . Collaboration with [place] who reports a voice message was  received from Mrs Arsenio Loader requesting in home caregiver resources . Mailed in home caregiver resources to Mrs Arsenio Loader along with respite care information  Completed 07/31/20 with [name] Inter-disciplinary care team collaboration (see longitudinal plan of care) . Successful outbound call placed to [name] to assist with care coordination needs o Mrs Arsenio Loader reports she is interested in more affordable respite options to assist for hours/day/weekends when she needs assistance caring for the patient . Educated Mrs Arsenio Loader on in home caregivers options to assist with a few hours when running errands o Mrs Arsenio Loader reports she is not willing to have a caregiver in the home while she is not present and declined this resource option . Educated Mrs Arsenio Loader on adult day program offered by [employer] for daily care o Advised Mrs Arsenio Loader this is a resource she would have to use and/or pay for consistently for staffing purposes o Mailed information on this resource for review . Discussed for weekend respite care she would need to look at ALF or SNF for out of home care o The patient has already accessed respite at [facility] several times; Mrs Arsenio Loader would like an option closer to home o Mailed a list of Assisted Living Facilities in [place] for review o Advised Mrs Colbert Ewing is not clear on each ALF's respite guidelines at this time . Scheduled follow up over the next month  CCM RN CM Interventions:  08/05/20 call completed with spouse [name]   Inbound call received from [name] regarding the need for respite care for Dylan [name] for whom she is the primary caregiver  Determined [name] is using [facility] for over night respite when she needs to travel  Determined [name] would like resources for in home respite care for temporary periods of relief, she will be able to privately pay   Reinforced the importance for [name] to take time for  herself and to balance her activity with rest  Encouraged [name] not to be afraid to ask family or friends for help when needed  Sent in basket message to embedded [place] with request to assist patient/caregiver with caregiver resources  Confirmed [name] received the mailed educational material related to "Caregiver Stress and Burnout" and found this to be helpful   Discussed plans with [name] for ongoing care management follow up and confirmed she has the direct contact information for care management team  Patient Self Care Activities: With the help of Mrs Arsenio Loader  . Attends all scheduled provider appointments . Supportive caregiver to assist with patient care needs  Initial goal documentation     .  "to transfer easier" (pt-stated)        CARE PLAN ENTRY (see longitudinal plan of care for additional care plan information)  Current Barriers:  [name] Knowledge Deficits related to disease progression secondary to PD . Chronic Disease  Management support and education needs related to Type II DM, non-insulin dependent, CKD, stage III, Essential hypertension, Dementia . Lacks caregiver support.  . Cognitive Deficits  Nurse Case Manager Clinical Goal(s):  [name] Over the next 180 days, patient will work with the CCM team and PCP to address needs related to disease education and support to improve Self health management of PD   CCM RN CM Interventions: 08/05/20 call completed with spouse [name]  . Inter-disciplinary care team collaboration (see longitudinal plan of care) . Evaluation of current treatment plan related to Parkinson's disease and patient's adherence to plan as established by provider . Determined patient is having difficulty lifting himself while in his hospital bed in order to rise and sit on the edge of the bed . Determined his spouse is having difficulty assisting him with this type of movement in order to prepare for transferring out of his . Educated  on potential home safety equipment for use of lifting and transferring; Determined patient's home/bedroom does not allow for a Hoyer lift due to limited space; Determined spouse is using a gait belt as needed for lifting; Determined patient will benefit from having a trapeze bar attached to his hospital bed . Collaborated with PCP [name] regarding Rx needed for trapeze bar attachment for patient's semi-electric hospital bed to assist with movement and transferring  . Discussed plans with patient for ongoing care management follow up and provided patient with direct contact information for care management team  Patient Self Care Activities:  . Attends all scheduled provider appointments . Supportive caregiver to assist with care needs  . Unable to self administer medications as prescribed . Lacks social connections . Unable to perform ADLs independently . Unable to perform IADLs independently  Initial goal documentation      Plan:   Telephone follow up appointment with care management team member scheduled for: 09/01/20  [name], RN, BSN, CCM Care Management Coordinator [facility]  Direct Phone: [phone]

## 2020-08-10 NOTE — Patient Instructions (Signed)
Visit Information  Goals Addressed              This Visit's Progress     Patient Stated   .  "I would like some respite resources" (pt-stated)        Caregiver stated:  CARE PLAN ENTRY (see longitudinal plan of care for additional care plan information)  Current Barriers:  . 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 Inability to remain in the home alone . Chronic conditions including DM II, CKD III, Dementia, and Parkinsonism which put patient at increased risk for hospitalization  Social Work Clinical Goal(s):  [name] Over the next 90 days the patient and his caregiver will work with SW to identify respite resources  CCM SW Interventions: Completed 08/05/20 . Collaboration with Dylan [name] who reports a voice message was received from Dylan [name] requesting in home caregiver resources . Mailed in home caregiver resources to Dylan [name] along with respite care information  Completed 07/31/20 with Dylan [name] Inter-disciplinary care team collaboration (see longitudinal plan of care) . Successful outbound call placed to Dylan. Dylan [name] to assist with care coordination needs o Dylan [name] reports she is interested in more affordable respite options to assist for hours/day/weekends when she needs assistance caring for the patient . Educated Dylan [name] on in home caregivers options to assist with a few hours when running errands o Dylan [name] reports she is not willing to have a caregiver in the home while she is not present and declined this resource option . Educated Dylan [name] on adult day program offered by [employer] for daily care o Advised Dylan [name] this is a resource she would have to use and/or pay for consistently for staffing purposes o Mailed information on this resource for review . Discussed for weekend respite care she would need to look at ALF or SNF for out of home care o The patient has already accessed respite at  [facility] several times; Dylan [name] would like an option closer to home o Mailed a list of Assisted Living Facilities in [place] for review o Advised Dylan [name] is not clear on each ALF's respite guidelines at this time . Scheduled follow up over the next month  CCM RN CM Interventions:  08/05/20 call completed with spouse Dylan [name]   Inbound call received from [name] [name] regarding the need for respite care for Dylan [name] for whom she is the primary caregiver  Determined [name] [name] is using [facility] for over night respite when she needs to travel  Determined [name] [name] would like resources for in home respite care for temporary periods of relief, she will be able to privately pay   Reinforced the importance for [name] [name] to take time for herself and to balance her activity with rest  Encouraged [name] [name] not to be afraid to ask family or friends for help when needed  Sent in basket message to embedded [place] with request to assist patient/caregiver with caregiver resources  Confirmed [name] [name] received the mailed educational material related to "Caregiver Stress and Burnout" and found this to be helpful   Discussed plans with [name] [name] for ongoing care management follow up and confirmed she has the direct contact information for care management team  Patient Self Care Activities: With the help of Dylan [name]  . Attends all scheduled provider appointments . Supportive caregiver to assist with patient care needs  Initial goal documentation     .  "  to transfer easier" (pt-stated)        [place] (see longitudinal plan of care for additional care plan information)  Current Barriers:  [name] Knowledge Deficits related to disease progression secondary to PD . Chronic Disease Management support and education needs related to Type II DM, non-insulin dependent, CKD, stage III, Essential hypertension, Dementia . Lacks caregiver support.   . Cognitive Deficits  Nurse Case Manager Clinical Goal(s):  [name] Over the next 180 days, patient will work with the CCM team and PCP to address needs related to disease education and support to improve Self health management of PD   CCM RN CM Interventions: 08/05/20 call completed with spouse Dylan [name]  . Inter-disciplinary care team collaboration (see longitudinal plan of care) . Evaluation of current treatment plan related to Parkinson's disease and patient's adherence to plan as established by provider . Determined patient is having difficulty lifting himself while in his hospital bed in order to rise and sit on the edge of the bed . Determined his spouse is having difficulty assisting him with this type of movement in order to prepare for transferring out of his . Educated on potential home safety equipment for use of lifting and transferring; Determined patient's home/bedroom does not allow for a Hoyer lift due to limited space; Determined spouse is using a gait belt as needed for lifting; Determined patient will benefit from having a trapeze bar attached to his hospital bed . Collaborated with PCP [name] regarding Rx needed for trapeze bar attachment for patient's semi-electric hospital bed to assist with movement and transferring  . Discussed plans with patient for ongoing care management follow up and provided patient with direct contact information for care management team  Patient Self Care Activities:  . Attends all scheduled provider appointments . Supportive caregiver to assist with care needs  . Unable to self administer medications as prescribed . Lacks social connections . Unable to perform ADLs independently . Unable to perform IADLs independently  Initial goal documentation       Patient verbalizes understanding of instructions provided today.   Telephone follow up appointment with care management team member scheduled for: 09/01/20  [name], RN, BSN, CCM Care  Management Coordinator [facility] [facility]  Direct Phone: [phone]

## 2020-08-12 ENCOUNTER — Telehealth: Payer: Self-pay

## 2020-08-12 NOTE — Telephone Encounter (Signed)
Dylan [name] with [facility] was calling to report that the pt had a fall on Sunday and that his wife had to call the ambulance to help pick him up off of the ground, the pt reported no pain and that the patient no longer wants speech therapy.

## 2020-08-17 ENCOUNTER — Telehealth: Payer: Self-pay

## 2020-08-17 ENCOUNTER — Ambulatory Visit: Payer: Medicare HMO

## 2020-08-17 DIAGNOSIS — N183 Chronic kidney disease, stage 3 unspecified: Secondary | ICD-10-CM

## 2020-08-17 DIAGNOSIS — G20 Parkinson's disease: Secondary | ICD-10-CM

## 2020-08-17 DIAGNOSIS — N1831 Chronic kidney disease, stage 3a: Secondary | ICD-10-CM

## 2020-08-17 NOTE — Telephone Encounter (Signed)
Dylan [name] said that she will come by the office today to get a urine cup and hat and bring the sample back.

## 2020-08-17 NOTE — Telephone Encounter (Signed)
-----   Message from [name], MD sent at 08/15/2020  8:28 AM EDT ----- He did not submit urine sample at last visit (OR microalbumin was not done). Please have him come in for lab visit for urine sample. [name] may come get cup if needed, please provide hat as well.   Thanks - have her try to get in by this Thursday

## 2020-08-18 ENCOUNTER — Telehealth: Payer: Self-pay

## 2020-08-18 NOTE — Chronic Care Management (AMB) (Signed)
Chronic Care Management    Social Work Follow Up Note  08/17/2020 Name: Dylan [name]. MRN: [id number] DOB: 09/20/1941  Dylan [name]. is a 79 y.o. year old male who is a primary care patient of [name], MD. The CCM team was consulted for assistance with care coordination.   Review of patient status, including review of consultants reports, other relevant assessments, and collaboration with appropriate care team members and the patient's provider was performed as part of comprehensive patient evaluation and provision of chronic care management services.    SDOH (Social Determinants of Health) assessments performed: No    Outpatient Encounter Medications as of 08/17/2020  Medication Sig  . Ascorbic Acid (VITAMIN C) 1000 MG tablet Take 1,000 mg by mouth every morning.   . carbidopa-levodopa (SINEMET IR) 25-100 MG tablet Take 2 tablets by mouth 3 (three) times daily.  . carvedilol (COREG) 12.5 MG tablet TAKE 1 TABLET BY MOUTH 2 TIMES DAILY.  [name] Cholecalciferol (VITAMIN D3) 5000 units CAPS Take 5,000 Units by mouth daily with supper.   . COLCRYS 0.6 MG tablet Take 1 tablet (0.6 mg total) by mouth daily. (Patient taking differently: Take 0.6 mg by mouth daily as needed. )  . desonide (DESOWEN) 0.05 % lotion Apply topically 2 times per day prn  . diclofenac Sodium (VOLTAREN) 1 % GEL Apply 2 g topically 3 (three) times daily. (Patient taking differently: Apply 2 g topically as needed. )  . 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 Lancet Devices (ONE TOUCH DELICA LANCING DEV) MISC Please fill ONE TOUCH DELICA LANCING DEVICE.  Use to test blood sugar twice daily as directed. E11.65  . Lancets MISC Please fill basic/generic push button lancets.  Patient to test blood sugar twice daily. DX: E11.65  . Melatonin 10 MG TABS Take 10 mg by mouth at bedtime.   . memantine (NAMENDA) 10 MG tablet TAKE 1 TABLET BY MOUTH TWICE A DAY  .  metFORMIN (GLUCOPHAGE) 1000 MG tablet TAKE 1 TABLET BY MOUTH EVERY DAY  . pravastatin (PRAVACHOL) 20 MG tablet TAKE 1 TABLET BY ORAL ROUTE EVERY DAY  . QUEtiapine (SEROQUEL) 25 MG tablet Take 1 tablet (25 mg total) by mouth at bedtime as needed.  . rivastigmine (EXELON) 9.5 mg/24hr Place 1 patch (9.5 mg total) onto the skin daily.  . TRULICITY 1.5 IR/4.4RX SOPN INJECT 1.5 MG INTO THE SKIN ONCE A WEEK. (Patient taking differently: Inject 1.5 mg into the skin every Monday. )   No facility-administered encounter medications on file as of 08/17/2020.     Goals Addressed              This Visit's Progress     Patient Stated   .  "I would like some respite resources" (pt-stated)        Caregiver stated:  CARE PLAN ENTRY (see longitudinal plan of care for additional care plan information)  Current Barriers:  . 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 Inability to remain in the home alone . Chronic conditions including DM II, CKD III, Dementia, and Parkinsonism which put patient at increased risk for hospitalization  Social Work Clinical Goal(s):  [name] Over the next 90 days the patient and his caregiver will work with SW to identify respite resources  CCM SW Interventions: Completed 08/17/20 . Successful outbound call placed to Mrs Arsenio Loader to confirm receipt of mailed resources . Determined Mrs Arsenio Loader has received resources  but has yet to review information . Scheduled follow up call over the next 60 days  Completed 08/05/20 . Collaboration with [place] who reports a voice message was received from Mrs Arsenio Loader requesting in home caregiver resources . Mailed in home caregiver resources to Mrs Arsenio Loader along with respite care information  Completed 07/31/20 with [name] Inter-disciplinary care team collaboration (see longitudinal plan of care) . Successful outbound call placed to [name] to assist with care coordination needs o Mrs  Arsenio Loader reports she is interested in more affordable respite options to assist for hours/day/weekends when she needs assistance caring for the patient . Educated Mrs Arsenio Loader on in home caregivers options to assist with a few hours when running errands o Mrs Arsenio Loader reports she is not willing to have a caregiver in the home while she is not present and declined this resource option . Educated Mrs Arsenio Loader on adult day program offered by [employer] for daily care o Advised Mrs Arsenio Loader this is a resource she would have to use and/or pay for consistently for staffing purposes o Mailed information on this resource for review . Discussed for weekend respite care she would need to look at ALF or SNF for out of home care o The patient has already accessed respite at [facility] several times; Mrs Arsenio Loader would like an option closer to home o Mailed a list of Assisted Living Facilities in [place] for review o Advised Mrs Colbert Ewing is not clear on each ALF's respite guidelines at this time . Scheduled follow up over the next month  CCM RN CM Interventions:  08/05/20 call completed with spouse [name]   Inbound call received from [name] regarding the need for respite care for Mr. Spieker for whom she is the primary caregiver  Determined [name] is using [facility] for over night respite when she needs to travel  Determined [name] would like resources for in home respite care for temporary periods of relief, she will be able to privately pay   Reinforced the importance for [name] to take time for herself and to balance her activity with rest  Encouraged [name] not to be afraid to ask family or friends for help when needed  Sent in basket message to embedded [place] with request to assist patient/caregiver with caregiver resources  Confirmed [name] received the mailed educational material related to "Caregiver Stress and Burnout" and  found this to be helpful   Discussed plans with [name] for ongoing care management follow up and confirmed she has the direct contact information for care management team  Patient Self Care Activities: With the help of Mrs Arsenio Loader  . Attends all scheduled provider appointments . Supportive caregiver to assist with patient care needs  Initial goal documentation     .  "to transfer easier" (pt-stated)        CARE PLAN ENTRY (see longitudinal plan of care for additional care plan information)  Current Barriers:  [name] Knowledge Deficits related to disease progression secondary to PD . Chronic Disease Management support and education needs related to Type II DM, non-insulin dependent, CKD, stage III, Essential hypertension, Dementia . Lacks caregiver support.  . Cognitive Deficits  Nurse Case Manager Clinical Goal(s):  [name] Over the next 180 days, patient will work with the CCM team and PCP to address needs related to disease education and support to improve Self health management of PD   CCM SW  Interventions: Completed 08/17/20 with [name] . Successful outbound call placed to Mrs Arsenio Loader to assist with care coordination needs . Determined Mrs Arsenio Loader was contacted by DME company who reported being out of stock of trapeze bar . Advised Mrs Colbert Ewing would collaborate with RN Care Manager to determine if alternative options were available . Collaboration with [place] who reported due to limited availability the patient would have to wait for more to come in [place] CM Interventions: 08/05/20 call completed with spouse [name]  . Inter-disciplinary care team collaboration (see longitudinal plan of care) . Evaluation of current treatment plan related to Parkinson's disease and patient's adherence to plan as established by provider . Determined patient is having difficulty lifting himself while in his hospital bed in order to rise and sit on the edge of the  bed . Determined his spouse is having difficulty assisting him with this type of movement in order to prepare for transferring out of his . Educated on potential home safety equipment for use of lifting and transferring; Determined patient's home/bedroom does not allow for a Hoyer lift due to limited space; Determined spouse is using a gait belt as needed for lifting; Determined patient will benefit from having a trapeze bar attached to his hospital bed . Collaborated with PCP [name] regarding Rx needed for trapeze bar attachment for patient's semi-electric hospital bed to assist with movement and transferring  . Discussed plans with patient for ongoing care management follow up and provided patient with direct contact information for care management team  Patient Self Care Activities:  . Attends all scheduled provider appointments . Supportive caregiver to assist with care needs  . Unable to self administer medications as prescribed . Lacks social connections . Unable to perform ADLs independently . Unable to perform IADLs independently  Please see past updates related to this goal by clicking on the "Past Updates" button in the selected goal        Other   .  "we need railings for our porch"   Not on track     [name] stated  [place] (see longitudinal plan of care for additional care plan information)  Current Barriers:  . Financial constraints related to cost of home modification . ADL IADL limitations . Patient lives in an [place] neighborhood and is limited to options for railings allowed . Chronic conditions including DM II, CKD III, Dementia, and Parkinsonism directly impacting patient ability to access stairs safely without a railing  Social Work Clinical Goal(s):  [name] Over the next 30 days the patient and his caregiver will work with SW to identify resources to assist with home modification need  CCM SW Interventions: Completed 08/17/20 with [name] . Determined [name] has not yet been contacted by [employer] . Provided contact number to resource and encouraged Mrs Arsenio Loader to follow up to complete screening to determine eligibility . Scheduled follow up call over the next 60 days  Completed 07/20/20 with [name] . Successful outbound call placed to [name] to inform her SW has yet to receive contact from [place] if they are able to accommodate [facility] railing requirements . Discussed the patient continues to work with PT from [facility] has also been in discussion with [name] surrounding railing needs o Mrs Arsenio Loader is unaware if a referral to an outside agency has been placed on the patients behalf . Determined Mrs Arsenio Loader  would like SW to place a referral to [employer] to determine if patient eligible for program o Successfully placed referral via [employer] . Advised [name] it may be several weeks before she is contacted by resource . Scheduled follow up call over the next month  Completed 07/01/20 with [name] . Inter-disciplinary care team collaboration (see longitudinal plan of care) . Successful outbound call placed to [name] to assist with ongoing care coordination . Determined the patient is in need of hand railing in the front and garage entrances o [name] reports the patient home is in a neighborhood governed by an [place] and railings must be wrought iron . Discussed the patient was seen in the home by a home health SW who informed [name] there was a resource to help with this at no cost o [name] unable to recall home health agency name and has not received more information from home health SW . Advised [name] that most resources that assist with home modifications are based on income o Discussed possible barriers to locating a resource to assist with railings approved by patient  HOA . Collaboration with Consult[employer] civil [employer]; unable to identify past home health agency involved with the patient . Collaboration with Sharol Given with [employer] to inquire if the agency is able to assist with HOA approved railings . SW to continue to follow  Patient Self Care Activities:  . Attends all scheduled provider appointments . Calls pharmacy for medication refills . Calls provider office for new concerns or questions . Supportive caregiver to assist with patient care needs  Please see past updates related to this goal by clicking on the "Past Updates" button in the selected goal          Follow Up Plan: SW will follow up with patient by phone over the next month.   Daneen Schick, BSW, CDP Social Worker, Certified Dementia Practitioner [place] [facility] [place] [facility] [phone]  Total time spent performing care coordination and/or care management activities with the patient by phone or face to face = 15 minutes.

## 2020-08-18 NOTE — Patient Instructions (Signed)
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  Inability to remain in the home alone  Chronic conditions including DM II, CKD III, Dementia, and Parkinsonism which put patient at increased risk for hospitalization  Social Work Clinical Goal(s):   Over the next 90 days the patient and his caregiver will work with SW to identify respite resources  CCM SW Interventions: Completed 08/17/20  Successful outbound call placed to Mrs Arsenio Loader to confirm receipt of mailed resources  Determined Mrs Arsenio Loader has received resources but has yet to review information  Scheduled follow up call over the next 60 days  Completed 08/05/20  Collaboration with [place] who reports a voice message was received from Mrs Arsenio Loader requesting in home caregiver resources  Mailed in home caregiver resources to Mrs Arsenio Loader along with respite care information  Completed 07/31/20 with [place] care team collaboration (see longitudinal plan of care)  Successful outbound call placed to [name] to assist with care coordination needs o Mrs Arsenio Loader reports she is interested in more affordable respite options to assist for hours/day/weekends when she needs assistance caring for the patient  Educated Mrs Arsenio Loader on in home caregivers options to assist with a few hours when running errands o Mrs Arsenio Loader reports she is not willing to have a caregiver in the home while she is not present and declined this resource option  Educated Mrs Arsenio Loader on adult day program offered by [employer] for daily care o Advised Mrs Arsenio Loader  this is a resource she would have to use and/or pay for consistently for staffing purposes o Mailed information on this resource for review  Discussed for weekend respite care she would need to look at ALF or SNF for out of home care o The patient has already accessed respite at [facility] several times; Mrs Arsenio Loader would like an option closer to home o Mailed a list of Assisted Living Facilities in [place] for review o Advised Mrs Colbert Ewing is not clear on each ALF's respite guidelines at this time  Scheduled follow up over the next month  CCM RN CM Interventions:  08/05/20 call completed with spouse [name]   Inbound call received from [name] regarding the need for respite care for Mr. Rekowski for whom she is the primary caregiver  Determined [name] is using [facility] for over night respite when she needs to travel  Determined [name] would like resources for in home respite care for temporary periods of relief, she will be able to privately pay   Reinforced the importance for [name] to take time for herself and to balance her activity with rest  Encouraged [name] not to be afraid to ask family or friends for help when needed  Sent in basket message to embedded [place] with request to assist patient/caregiver with caregiver resources  Confirmed [name] received the mailed educational material related to "Caregiver Stress and Burnout" and found this to be helpful   Discussed plans with [name] for ongoing care management follow  up and confirmed she has the direct contact information for care management team  Patient Self Care Activities: With the help of Mrs Arsenio Loader   Attends all scheduled provider appointments  Supportive caregiver to assist with patient care needs  Initial goal documentation       "to transfer easier" (pt-stated)        CARE PLAN ENTRY (see longitudinal plan of care for additional care plan  information)  Current Barriers:   Knowledge Deficits related to disease progression secondary to PD  Chronic Disease Management support and education needs related to Type II DM, non-insulin dependent, CKD, stage III, Essential hypertension, Dementia  Lacks caregiver support.   Cognitive Deficits  Nurse Case Manager Clinical Goal(s):   Over the next 180 days, patient will work with the CCM team and PCP to address needs related to disease education and support to improve Self health management of PD   CCM SW Interventions: Completed 08/17/20 with [name]  Successful outbound call placed to Mrs Arsenio Loader to assist with care coordination needs  Determined Mrs Arsenio Loader was contacted by DME company who reported being out of stock of trapeze bar  Advised Mrs Colbert Ewing would collaborate with RN Care Manager to determine if alternative options were available  Collaboration with [place] who reported due to limited availability the patient would have to wait for more to come in stock  CCM RN CM Interventions: 08/05/20 call completed with spouse Ancil Linsey care team collaboration (see longitudinal plan of care)  Evaluation of current treatment plan related to Parkinson's disease and patient's adherence to plan as established by provider  Determined patient is having difficulty lifting himself while in his hospital bed in order to rise and sit on the edge of the bed  Determined his spouse is having difficulty assisting him with this type of movement in order to prepare for transferring out of his  Educated on potential home safety equipment for use of lifting and transferring; Determined patient's home/bedroom does not allow for a Hoyer lift due to limited space; Determined spouse is using a gait belt as needed for lifting; Determined patient will benefit from having a trapeze bar attached to his hospital bed  Collaborated with PCP [name]  regarding Rx needed for trapeze bar attachment for patient's semi-electric hospital bed to assist with movement and transferring   Discussed plans with patient for ongoing care management follow up and provided patient with direct contact information for care management team  Patient Self Care Activities:   Attends all scheduled provider appointments  Supportive caregiver to assist with care needs   Unable to self administer medications as prescribed  Lacks social connections  Unable to perform ADLs independently  Unable to perform IADLs independently  Please see past updates related to this goal by clicking on the "Past Updates" button in the selected goal        Other     "we need railings for our porch"   Not on track     [name] stated  CARE PLAN ENTRY (see longitudinal plan of care for additional care plan information)  Current Barriers:   Financial constraints related to cost of home modification  ADL IADL limitations  Patient lives [place] an [place] neighborhood and is limited to options for railings allowed  Chronic conditions including DM II, CKD III, Dementia, and Parkinsonism directly impacting patient ability to access stairs safely without a railing  Social Work Clinical Goal(s):  Over the next 30 days the patient and his caregiver will work with SW to identify resources to assist with home modification need  CCM SW Interventions: Completed 08/17/20 with [name]  Determined [name] has not yet been contacted by [employer]  Provided contact number to resource and encouraged Mrs Arsenio Loader to follow up to complete screening to determine eligibility  Scheduled follow up call over the next 60 days  Completed 07/20/20 with [name]  Successful outbound call placed to [name] to inform her SW has yet to receive contact from [place] if they are able to accommodate [facility] railing  requirements  Discussed the patient continues to work with PT from [place] has also been in discussion with [name] surrounding railing needs o Mrs Arsenio Loader is unaware if a referral to an outside agency has been placed on the patients behalf  Determined Mrs Arsenio Loader would like SW to place a referral to [employer] to determine if patient eligible for program o Successfully placed referral via [employer] platform  Advised [name] it may be several weeks before she is contacted by resource  Scheduled follow up call over the next month  Completed 07/01/20 with [name]  Inter-disciplinary care team collaboration (see longitudinal plan of care)  Successful outbound call placed to [name] to assist with ongoing care coordination  Determined the patient is in need of hand railing in the front and garage entrances o [name] reports the patient home is in a neighborhood governed by an [place] and railings must be wrought iron  Discussed the patient was seen in the home by a home health SW who informed [name] there was a resource to help with this at no cost o [name] unable to recall home health agency name and has not received more information from home health SW  Advised [name] that most resources that assist with home modifications are based on income o Discussed possible barriers to locating a resource to assist with railings approved by patient [place] with [employer]; unable to identify past home health agency involved with the patient  Collaboration with Sharol Given with [employer] to inquire if the agency is able to assist with HOA approved railings  SW to continue to follow  Patient Self Care Activities:   Attends all scheduled provider appointments  Calls pharmacy for medication refills  Calls provider office for new concerns or questions  Supportive caregiver to assist with  patient care needs  Please see past updates related to this goal by clicking on the "Past Updates" button in the selected goal           Follow Up Plan: SW will follow up with patient by phone over the next month   Daneen Schick, BSW, CDP Social Worker, Certified Dementia Practitioner [place] [facility] [place] [facility] [phone]

## 2020-08-18 NOTE — Telephone Encounter (Signed)
I left a message asking the Dylan [name] to call and reschedule AWV with [name] to either October or November [facility] calendar year).

## 2020-08-19 ENCOUNTER — Other Ambulatory Visit (INDEPENDENT_AMBULATORY_CARE_PROVIDER_SITE_OTHER): Payer: Medicare HMO

## 2020-08-19 DIAGNOSIS — N1831 Chronic kidney disease, stage 3a: Secondary | ICD-10-CM | POA: Diagnosis not present

## 2020-08-19 DIAGNOSIS — E1122 Type 2 diabetes mellitus with diabetic chronic kidney disease: Secondary | ICD-10-CM | POA: Diagnosis not present

## 2020-08-19 LAB — POCT URINALYSIS DIPSTICK
Bilirubin, UA: NEGATIVE
Blood, UA: NEGATIVE
Glucose, UA: NEGATIVE
Ketones, UA: NEGATIVE
Leukocytes, UA: NEGATIVE
Nitrite, UA: NEGATIVE
Protein, UA: NEGATIVE
Spec Grav, UA: 1.015 (ref 1.010–1.025)
Urobilinogen, UA: 0.2 E.U./dL
pH, UA: 6.5 (ref 5.0–8.0)

## 2020-08-19 LAB — POCT UA - MICROALBUMIN
Creatinine, POC: 50 mg/dL
Microalbumin Ur, POC: 10 mg/L

## 2020-08-21 ENCOUNTER — Other Ambulatory Visit: Payer: Self-pay | Admitting: Neurology

## 2020-09-01 ENCOUNTER — Telehealth: Payer: Medicare HMO

## 2020-09-02 ENCOUNTER — Other Ambulatory Visit: Payer: Self-pay | Admitting: Internal Medicine

## 2020-09-04 ENCOUNTER — Ambulatory Visit: Payer: Medicare HMO | Admitting: Podiatry

## 2020-09-06 ENCOUNTER — Other Ambulatory Visit: Payer: Self-pay | Admitting: Internal Medicine

## 2020-09-07 ENCOUNTER — Telehealth: Payer: Medicare HMO

## 2020-09-07 ENCOUNTER — Other Ambulatory Visit: Payer: Medicare HMO

## 2020-09-07 ENCOUNTER — Other Ambulatory Visit: Payer: Self-pay

## 2020-09-07 DIAGNOSIS — N186 End stage renal disease: Secondary | ICD-10-CM

## 2020-09-07 DIAGNOSIS — E1122 Type 2 diabetes mellitus with diabetic chronic kidney disease: Secondary | ICD-10-CM

## 2020-09-08 ENCOUNTER — Telehealth: Payer: Medicare HMO

## 2020-09-08 LAB — HEMOGLOBIN A1C
Est. average glucose Bld gHb Est-mCnc: 137 mg/dL
Hgb A1c MFr Bld: 6.4 % — ABNORMAL HIGH (ref 4.8–5.6)

## 2020-09-10 ENCOUNTER — Other Ambulatory Visit: Payer: Self-pay

## 2020-09-10 ENCOUNTER — Ambulatory Visit: Payer: Self-pay

## 2020-09-10 ENCOUNTER — Telehealth: Payer: Medicare HMO

## 2020-09-10 DIAGNOSIS — F0391 Unspecified dementia with behavioral disturbance: Secondary | ICD-10-CM

## 2020-09-10 DIAGNOSIS — N1831 Chronic kidney disease, stage 3a: Secondary | ICD-10-CM

## 2020-09-10 DIAGNOSIS — N183 Chronic kidney disease, stage 3 unspecified: Secondary | ICD-10-CM

## 2020-09-10 DIAGNOSIS — G20 Parkinson's disease: Secondary | ICD-10-CM

## 2020-09-10 DIAGNOSIS — E1122 Type 2 diabetes mellitus with diabetic chronic kidney disease: Secondary | ICD-10-CM

## 2020-09-15 NOTE — Patient Instructions (Signed)
Visit Information  Goals Addressed            This Visit's Progress    COMPLETED: "to evaluate and treat chronic persistent cough"       CARE PLAN ENTRY (see longitudinal plan of care for additional care plan information)  Current Barriers:   Knowledge Deficits related to evaluation and treatment of persistent cough   Chronic Disease Management support and education needs related to Type II DM, non-insulin dependent, CKD, stage III, Essential hypertension  Cognitive Deficits  Parkinsonism  Risk for Aspiration   Nurse Case Manager Clinical Goal(s):   Over the next 90 days, patient will work with established Neurologist and PCP to address needs related to evaluation and treatment of persistent cough and or aspiration.   CCM RN CM Interventions:  09/10/20 completed call with spouse   Determined patient completed in home ST for treatment of dysphagia and dystonia   Re-educated on s/s suggestive of aspiration and when to call the doctor if symptoms occur; Re-educated on ways to help prevent aspiration   Discussed plans with patient for ongoing care management follow up and provided patient with direct contact information for care management team  Patient Self Care Activities:   Attends all scheduled provider appointments  Currently UNABLE TO independently perform self-care without assistance   Supportive caregiver, spouse [name] to assist with caregiver needs  Please see past updates related to this goal by clicking on the "Past Updates" button in the selected goal       "to improve his mood and agitation"   On track    Spouse stated:  [place] (see longitudinal plan of care for additional care plan information)  Current Barriers:   Knowledge Deficits related to disease process related to Dementia/Parkinsonism  Chronic Disease Management support and education needs related to Type II DM, non-insulin dependent, CKD, stage III, Essential hypertension,  Dementia  Cognitive Deficits  Supportive spouse to assist with care needs  Nurse Case Manager Clinical Goal(s):   Over the next 180 days, patient will work with the PCP and Neurologist  to address needs related to disease education and support for improved management of 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  Re-educated to spouse re: disease process and stages of dementia; Re-educated on risk for aspiration and ongoing fall risk secondary to PD   Re-educated spouse on sign/symptoms suggestive of aspiration; Educated on ways to help prevent aspiration and when to seek medical care if needed  Reviewed medications with patient and discussed spouse is administering patient's prescribed medications as directed, current regimen:  o carbidopa-levodopa (SINEMET IR) 25-100 MG tablet, Take 1.5 tablets by mouth 3 (three) times daily. (Patient taking differently: Take 1.5 tablets by mouth See admin instructions. Take 1 1/2  tablet by mouth three times daily - 10am, 5pm and 10pm) o  memantine (NAMENDA) 10 MG tablet, Take 1 tablet (10 mg total) by mouth 2 (two) times daily o  rivastigmine (EXELON) 4.6 mg/24hr, Place 1 patch (4.6 mg total) onto the skin daily. (Patient taking differently: Place 4.6 mg onto the skin every evening  Reviewed and discussed recent OV with Dr. Krista Blue completed on 07/27/20;  o 79 y.o. year old male  o Parkinsonian syndrome o Dementia with behavioral  issues o             He has slow progressive parkinsonism, with dementia, agitation sometimes o             Fairly symmetric parkinsonian features, significant vertical gaze palsy, also reported orthostatic blood pressure changes. 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, such as progressive nuclear palsy, versus multisystem atrophy, parkinsonian       types o             CT of the brain showed generalized atrophy, small vessel disease, no acute abnormality o             Increased Exelon patch to 9.5 mg daily, keep Namenda 10 mg twice a day o             Family reported mild improvement with Sinemet 25/100 mg, will increase from 1.5 to 2 tablets three times a day  Discussed plans with patient for ongoing care management follow up and provided patient with direct contact information for care management team  Patient Self Care Activities:   Attends all scheduled provider appointments  Currently UNABLE TO independently perform self-care without assistance   Supportive caregiver, spouse [name] to assist with care needs  Please see past updates related to this goal by clicking on the "Past Updates" button in the selected goal       "to maintain his A1c below 7"   On track    Spouse stated Current Barriers:   Knowledge Deficits related to disease process and Self Health management of Diabetes   Chronic Disease Management support and education needs related to CKDIII, DMII, HTN, memory loss  Nurse Case Manager Clinical Goal(s):   04/03/20 New  Over the next 90 days, spouse will report patient continues to modify his diet to recommended ADA diet and is taking his diabetic medications exactly as prescribed in order to better manage his DM and achieve lowering his A1c <7.0 % Goal Met  New 09/10/20 Over the next 90 days, patient will continue to work with the CCM team and PCP for disease education and support to improve Self Health management of Diabetes   CCM RN CM Interventions:  11/11//21 call completed with spouse [name]  Evaluation of current treatment plan related to diabetes and patient's adherence to plan as established by provider  Reviewed and discussed patient's current A1c is down to 6.4%;  Positive reinforcement provided to spouse for assisting patient with lifestyle changes to help lower his A1c and improve his overall health   Reviewed medications with spouse and discussed patient is adhering to taking his weekly injection of Trulicity and patient is self injecting; with spouse [name] supervising dosage and administration  Re-educated spouse [name], providing education and rationale, to check cbg daily before meals and record, calling the CCM team and or PCP for findings outside established parameters; FBS 80-130 and <180 after meals  Discussed plans with patient for ongoing care management follow up and provided patient with direct contact information for care management team  Patient Self Care Activities:   Attends all scheduled provider appointments  Currently UNABLE TO independently perform self-care without assistance   Supportive caregiver, spouse [name] to assist with care needs  Please see past updates related to this goal by clicking on the "Past Updates" button in the selected goal       "to prevent future falls"   Not on  track    [place] (see longitudinal plan of care for additional care plan information)  Spouse states Current Barriers:   Impaired gait/balance  High Risk for Falls  Newly diagnosed; Mild cortical atrophy mostly in the mesial temporal lobes and parietal lobes.  Newly diagnosed; Mild chronic microvascular ischemic changes  Nurse Case Manager Clinical Goal(s):   New 09/10/20 Over the next 90 days, patient and caregiver will continue to work with the CCM team and PCP for disease education and support to help reduce fall risk and or injury related to falls secondary to Parkinson's Disease   CCM RN CM Interventions:  09/10/20 call completed with spouse Ancil Linsey care team collaboration (see longitudinal plan of care)  Evaluation of current treatment plan related to Impaired Physical Mobility and patient's  adherence to plan as established by provider  Determined patient completed Inpatient rehabilitation and as well as in home PT/OT following his last hospitalization   Determined patient has a semi-electric hospital bed and all other DME needed at this time  Discussed spouse's concerns that patient has exited the bed w/o her assistance and she is fearful he will have a fall  Provided spouse with the contact information for a chair exit alarm; discussed how this home safety device works; discussed this safety device is not covered by Medicare and will have to be paid for out of pocket; discussed DME suppliers in the area and provided spouse with the contact numbers and address's  Determined patient experienced a fall last month when he leaned backwards in his wheelchair and fell backwards, he injured his elbow and this [facility]  Re-educated spouse on the fatality risk secondary to frequency falls; Re-Educated on fall prevention   Mailed printed education materials related to PD and Falls   Discussed plans with patient for ongoing care management follow up and provided patient with direct contact information for care management team  Patient Self Care Activities:   Attends all scheduled provider appointments  Currently UNABLE TO independently perform self-care without assistance   Supportive caregiver, spouse [name] to assist with care needs  Please see past updates related to this goal by clicking on the "Past Updates" button in the selected goal       Evaluate and treat increased PSA and urinary frequency       CARE PLAN ENTRY (see longitudinal plan of care for additional care plan information)  Current Barriers:   Knowledge Deficits related to evaluate and treat elevated PSA and urinary frequency   Chronic Disease Management support and education needs related to Type II DM, non-insulin dependent, CKD, stage III, Essential hypertension, Dementia,  Parkinsonism, unspecified  Cognitive Deficits  Nurse Case Manager Clinical Goal(s):   Over the next 90 days, patient will work with Urologist to address needs related to elevated PSA and urinary frequency   CCM RN CM Interventions:  09/10/20 call completed with spouse Ancil Linsey care team collaboration (see longitudinal plan of care)  Evaluation of current treatment plan related to elevated PSA and urinary frequency  and patient's adherence to plan as established by provider._0  Determined patient's PSA is elevated and PCP is referring to [facility], spouse is aware of procedure for new patient referral and outreach from provider once authorized by Medicare   Discussed plans with patient for ongoing care management follow up and provided patient with direct contact information for care management team  Patient Self Care  Activities:   Attends all scheduled provider appointments  Currently UNABLE TO independently perform self-care without assistance   Supportive caregiver, spouse [name] to assist with care needs  Initial goal documentation       The patient verbalized understanding of instructions, educational materials, and care plan provided today and declined offer to receive copy of patient instructions, educational materials, and care plan.   Telephone follow up appointment with care management team member scheduled for: 11/04/20  [name], RN, BSN, CCM Care Management Coordinator [facility]  Direct Phone: [phone]

## 2020-09-15 NOTE — Chronic Care Management (AMB) (Signed)
Chronic Care Management   Follow Up Note   09/10/2020 Name: Dylan [name]. MRN: [id number] DOB: [birth year]  Referred by: [name], MD Reason for referral : Chronic Care Management (CCM RNCM FU Call)   Dylan [name]. is a 79 y.o. year old male who is a primary care patient of [name], MD. The CCM team was consulted for assistance with chronic disease management and care coordination needs.    Review of patient status, including review of consultants reports, relevant laboratory and other test results, and collaboration with appropriate care team members and the patient's provider was performed as part of comprehensive patient evaluation and provision of chronic care management services.    SDOH (Social Determinants of Health) assessments performed: Yes - no acute challenges  See Care Plan activities for detailed interventions related to [place])   Placed outbound CCM RN CM follow up call with spouse Dylan [name] for a care plan update.     Outpatient Encounter Medications as of 09/10/2020  Medication Sig  . Ascorbic Acid (VITAMIN C) 1000 MG tablet Take 1,000 mg by mouth every morning.   . carbidopa-levodopa (SINEMET IR) 25-100 MG tablet Take 2 tablets by mouth 3 (three) times daily.  . carvedilol (COREG) 12.5 MG tablet TAKE 1 TABLET BY MOUTH 2 TIMES DAILY.  [name] Cholecalciferol (VITAMIN D3) 5000 units CAPS Take 5,000 Units by mouth daily with supper.   . COLCRYS 0.6 MG tablet Take 1 tablet (0.6 mg total) by mouth daily. (Patient taking differently: Take 0.6 mg by mouth daily as needed. )  . desonide (DESOWEN) 0.05 % lotion Apply topically 2 times per day prn  . diclofenac Sodium (VOLTAREN) 1 % GEL Apply 2 g topically 3 (three) times daily. (Patient taking differently: Apply 2 g topically as needed. )  . 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 Lancet Devices (ONE TOUCH DELICA LANCING DEV) MISC Please fill ONE TOUCH  DELICA LANCING DEVICE.  Use to test blood sugar twice daily as directed. E11.65  . Lancets MISC Please fill basic/generic push button lancets.  Patient to test blood sugar twice daily. DX: E11.65  . Melatonin 10 MG TABS Take 10 mg by mouth at bedtime.   . memantine (NAMENDA) 10 MG tablet TAKE 1 TABLET BY MOUTH TWICE A DAY  . metFORMIN (GLUCOPHAGE) 1000 MG tablet TAKE 1 TABLET BY MOUTH EVERY DAY  . pravastatin (PRAVACHOL) 20 MG tablet TAKE 1 TABLET BY ORAL ROUTE EVERY DAY  . QUEtiapine (SEROQUEL) 25 MG tablet TAKE 1 TABLET (25 MG TOTAL) BY MOUTH AT BEDTIME AS NEEDED.  . rivastigmine (EXELON) 9.5 mg/24hr Place 1 patch (9.5 mg total) onto the skin daily.  . TRULICITY 1.5 PR/9.4VO SOPN INJECT 1.5 MG INTO THE SKIN ONCE A WEEK.   No facility-administered encounter medications on file as of 09/10/2020.     Objective:  Lab Results  Component Value Date   HGBA1C 6.4 (H) 09/07/2020   HGBA1C 7.0 (H) 06/03/2020   HGBA1C 7.5 (H) 03/24/2020   Lab Results  Component Value Date   MICROALBUR 10 08/19/2020   LDLCALC 66 07/24/2019   CREATININE 1.17 05/11/2020   BP Readings from Last 3 Encounters:  08/09/20 (!) 146/62  07/29/20 130/74  07/27/20 (!) 143/66    Goals Addressed    . COMPLETED: "to evaluate and treat chronic persistent cough"       CARE PLAN ENTRY (see longitudinal plan of care for additional care plan  information)  Current Barriers:  [name] Knowledge Deficits related to evaluation and treatment of persistent cough  . Chronic Disease Management support and education needs related to Type II DM, non-insulin dependent, CKD, stage III, Essential hypertension . Cognitive Deficits . Parkinsonism . Risk for Aspiration   Nurse Case Manager Clinical Goal(s):  [name] Over the next 90 days, patient will work with established Neurologist and PCP to address needs related to evaluation and treatment of persistent cough and or aspiration.   CCM RN CM Interventions:  09/10/20 completed call with  spouse  . Determined patient completed in home ST for treatment of dysphagia and dystonia  . Re-educated on s/s suggestive of aspiration and when to call the doctor if symptoms occur; Re-educated on ways to help prevent aspiration  . Discussed plans with patient for ongoing care management follow up and provided patient with direct contact information for care management team  Patient Self Care Activities:  . Attends all scheduled provider appointments . Currently UNABLE TO independently perform self-care without assistance  . Supportive caregiver, spouse Dylan [name] to assist with caregiver needs  Please see past updates related to this goal by clicking on the "Past Updates" button in the selected goal      . "to improve his mood and agitation"   On track    Spouse stated:  [place] (see longitudinal plan of care for additional care plan information)  Current Barriers:  [name] Knowledge Deficits related to disease process related to Dementia/Parkinsonism . Chronic Disease Management support and education needs related to Type II DM, non-insulin dependent, CKD, stage III, Essential hypertension, Dementia . Cognitive Deficits . 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 Inter-disciplinary care team collaboration (see longitudinal plan of care) . 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 [name] Re-educated to spouse re: disease process and stages of dementia; Re-educated on risk for aspiration and ongoing fall risk secondary to PD  . Re-educated spouse on sign/symptoms suggestive of aspiration;  Educated on ways to help prevent aspiration and when to seek medical care if needed . Reviewed medications with patient and discussed spouse is administering patient's prescribed medications as directed, current regimen:  o carbidopa-levodopa (SINEMET IR) 25-100 MG tablet, Take 1.5 tablets by mouth 3 (three) times daily. (Patient taking differently: Take 1.5 tablets by mouth See admin instructions. Take 1 1/2  tablet by mouth three times daily - 10am, 5pm and 10pm) o  memantine (NAMENDA) 10 MG tablet, Take 1 tablet (10 mg total) by mouth 2 (two) times daily o  rivastigmine (EXELON) 4.6 mg/24hr, Place 1 patch (4.6 mg total) onto the skin daily. (Patient taking differently: Place 4.6 mg onto the skin every evening . Reviewed and discussed recent OV with Dr. Krista Blue completed on 07/27/20;  o 79 y.o. year old male  o Parkinsonian syndrome o Dementia with behavioral issues o             He has slow progressive parkinsonism, with dementia, agitation sometimes o             Fairly symmetric parkinsonian features, significant vertical gaze palsy, also reported orthostatic  blood pressure changes. o             Most likely central nervous system degenerative disorder, such as progressive nuclear palsy, versus multisystem atrophy, parkinsonian       types o             CT of the brain showed generalized atrophy, small vessel disease, no acute abnormality o             Increased Exelon patch to 9.5 mg daily, keep Namenda 10 mg twice a day o             Family reported mild improvement with Sinemet 25/100 mg, will increase from 1.5 to 2 tablets three times a day . Discussed plans with patient for ongoing care management follow up and provided patient with direct contact information for care management team  Patient Self Care Activities:  . Attends all scheduled provider appointments . Currently UNABLE TO independently perform self-care without assistance  . Supportive caregiver, spouse Dylan [name] to assist with care  needs  Please see past updates related to this goal by clicking on the "Past Updates" button in the selected goal      . "to maintain his A1c below 7"   On track    Spouse stated Current Barriers:  [name] Knowledge Deficits related to disease process and Self Health management of Diabetes  . Chronic Disease Management support and education needs related to CKDIII, DMII, HTN, memory loss  Nurse Case Manager Clinical Goal(s):  . 04/03/20 New  Over the next 90 days, spouse will report patient continues to modify his diet to recommended ADA diet and is taking his diabetic medications exactly as prescribed in order to better manage his DM and achieve lowering his A1c <7.0 % Goal Met . New 09/10/20 Over the next 90 days, patient will continue to work with the CCM team and PCP for disease education and support to improve Self Health management of Diabetes   CCM RN CM Interventions:  11/11//21 call completed with spouse Dylan [name] . Evaluation of current treatment plan related to diabetes and patient's adherence to plan as established by provider . Reviewed and discussed patient's current A1c is down to 6.4%; Positive reinforcement provided to spouse for assisting patient with lifestyle changes to help lower his A1c and improve his overall health  . Reviewed medications with spouse and discussed patient is adhering to taking his weekly injection of Trulicity and patient is self injecting; with spouse Dylan [name] supervising dosage and administration . Re-educated spouse Dylan [name], providing education and rationale, to check cbg daily before meals and record, calling the CCM team and or PCP for findings outside established parameters; FBS 80-130 and <180 after meals . Discussed plans with patient for ongoing care management follow up and provided patient with direct contact information for care management team  Patient Self Care Activities:  . Attends all scheduled provider appointments . Currently UNABLE TO  independently perform self-care without assistance  . Supportive caregiver, spouse Dylan [name] to assist with care needs  Please see past updates related to this goal by clicking on the "Past Updates" button in the selected goal      . "to prevent future falls"   Not on track    [place] (see longitudinal plan of care for additional care plan information)  Spouse states Current Barriers:  . Impaired gait/balance . High Risk for Falls . 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 [name]  Newly diagnosed; Mild chronic microvascular ischemic changes  Nurse Case Manager Clinical Goal(s):  . New 09/10/20 Over the next 90 days, patient and caregiver will continue to work with the CCM team and PCP for disease education and support to help reduce fall risk and or injury related to falls secondary to Parkinson's Disease   CCM RN CM Interventions:  09/10/20 call completed with spouse Dylan [name]  . Inter-disciplinary care team collaboration (see longitudinal plan of care) . Evaluation of current treatment plan related to Impaired Physical Mobility and patient's adherence to plan as established by provider . Determined patient completed Inpatient rehabilitation and as well as in home PT/OT following his last hospitalization  . Determined patient has a semi-electric hospital bed and all other DME needed at this time . Discussed spouse's concerns that patient has exited the bed w/o her assistance and she is fearful he will have a fall . Provided spouse with the contact information for a chair exit alarm; discussed how this home safety device works; discussed this safety device is not covered by Medicare and will have to be paid for out of pocket; discussed DME suppliers in the area and provided spouse with the contact numbers and address's . Determined patient experienced a fall last month when he leaned backwards in his wheelchair and fell backwards, he  injured his elbow and this was checked out in the ED at [facility] . Re-educated spouse on the fatality risk secondary to frequency falls; Re-Educated on fall prevention  . Mailed printed education materials related to PD and Falls  . Discussed plans with patient for ongoing care management follow up and provided patient with direct contact information for care management team  Patient Self Care Activities:  . Attends all scheduled provider appointments . Currently UNABLE TO independently perform self-care without assistance  . Supportive caregiver, spouse Dylan [name] to assist with care needs  Please see past updates related to this goal by clicking on the "Past Updates" button in the selected goal      . Evaluate and treat increased PSA and urinary frequency       CARE PLAN ENTRY (see longitudinal plan of care for additional care plan information)  Current Barriers:  [name] Knowledge Deficits related to evaluate and treat elevated PSA and urinary frequency  . Chronic Disease Management support and education needs related to Type II DM, non-insulin dependent, CKD, stage III, Essential hypertension, Dementia, Parkinsonism, unspecified . Cognitive Deficits  Nurse Case Manager Clinical Goal(s):  [name] Over the next 90 days, patient will work with Urologist to address needs related to elevated PSA and urinary frequency   CCM RN CM Interventions:  09/10/20 call completed with spouse Dylan [name]  . Inter-disciplinary care team collaboration (see longitudinal plan of care) . Evaluation of current treatment plan related to elevated PSA and urinary frequency  and patient's adherence to plan as established by provider.\ . Determined patient's PSA is elevated and PCP is referring to [facility], spouse is aware of procedure for new patient referral and outreach from provider once authorized by Medicare  . Discussed plans with patient for ongoing care management follow up and provided patient with direct  contact information for care management team  Patient Self Care Activities:  . Attends all scheduled provider appointments . Currently UNABLE TO independently perform self-care without assistance  . Supportive caregiver, spouse Dylan [name] to assist with care needs  Initial goal documentation        Plan:   Telephone follow up appointment with  care management team member scheduled for: 11/04/20  [name], RN, BSN, CCM Care Management Coordinator [facility]  Direct Phone: [phone]

## 2020-09-18 ENCOUNTER — Telehealth: Payer: Medicare HMO

## 2020-09-21 NOTE — Progress Notes (Signed)
I have reviewed and agreed above plan. 

## 2020-09-22 ENCOUNTER — Ambulatory Visit: Payer: Medicare HMO | Admitting: Podiatry

## 2020-09-28 ENCOUNTER — Telehealth: Payer: Medicare HMO

## 2020-09-28 ENCOUNTER — Telehealth: Payer: Self-pay

## 2020-09-28 NOTE — Telephone Encounter (Signed)
  Chronic Care Management   Outreach Note  09/28/2020 Name: Dylan [name]. MRN: [id number] DOB: 03-15-1941  Referred by: [name], MD Reason for referral : Care Coordination   SW placed an outbound call to the patients fiance and caregiver [name]. [name] reports she is unable to complete today's call due to having a doctor appointment. Rescheduled patient call.  Follow Up Plan: The care management team will reach out to the patient again over the next 10 days.   Daneen Schick, BSW, CDP Social Worker, Certified Dementia Practitioner [facility] [phone]

## 2020-09-29 ENCOUNTER — Encounter: Payer: Self-pay | Admitting: Nurse Practitioner

## 2020-09-29 ENCOUNTER — Other Ambulatory Visit: Payer: Medicare HMO | Admitting: Nurse Practitioner

## 2020-09-29 ENCOUNTER — Other Ambulatory Visit: Payer: Self-pay

## 2020-09-29 DIAGNOSIS — Z515 Encounter for palliative care: Secondary | ICD-10-CM

## 2020-09-29 DIAGNOSIS — G20 Parkinson's disease: Secondary | ICD-10-CM

## 2020-09-29 NOTE — Progress Notes (Signed)
[facility] Consult Note Telephone: [phone]  Fax: [phone]  PATIENT NAME: Dylan [name]. DOB: 1941/05/03 MRN: [id number]  PRIMARY CARE PROVIDER:   [name], MD  REFERRING PROVIDER:  [name], [address] [place],  [place] [address]  RESPONSIBLE PARTY:   Self; caregiver Ms.[name]  Due to the COVID-19 crisis, this visit was done via telemedicine from my office and it was initiated and consent by this patient and or family.  RECOMMENDATIONS and PLAN: 1.ACP:DNR and MOST form completed, placed in [facility]. Wishes are for Limited interventions, no feeding tube but wishes are for antibiotic, IVF  2.Palliative care encounter; Palliative medicine team will continue to support patient, patient's family, and medical team. Visit consisted of counseling and education dealing with the complex and emotionally intense issues of symptom management and palliative care in the setting of serious and potentially life-threatening illness  3. F/u visit 2 months if needed or sooner if declines for monitoring for decline, weight with Parkinson, dementia  I spent 50 minutes providing this consultation,  from 11:00am to 11:50am. More than 50% of the time in this consultation was spent coordinating communication.   HISTORY OF PRESENT ILLNESS:  Dylan [name]. is a 79 y.o. year old male with multiple medical problems including Parkinson disease, dementia, Aortic stenosis, complete heart block s/p pacemaker, peripheral vascular disease, hypertension, diabetes, neuropathy, chronic kidney disease, gout, vitamin b deficiency,vitamin D deficiency, anxiety, lipoma sgy, kyphoplasty, eye sgy. Follow-up Palliative care visit was in-person changed to telemedicine due to provider in quarantine, telephone as video not available. [name] in agreement, we talked about purpose of PC visit. We talked about [name] being Mr.  [name] primary caregiver is experiencing a health problem herself which is making it hard to help Dylan [name] with mobility. We talked about Dylan [name] is able to stand, take a few steps with a walker, no recent falls. We talked about Dylan [name] requiring assistance with bathing, dressing. [name] endorses she does have a shower chair built in but Dylan [name] did not like to sit on that bench. [name] endorses she purchased another shower chair today. We talked about [name] continuing to have to feed Mr [name]. DylanShannon does not appear to have lost weight. Dylan [name] appetite remains declined. We talked about increase in skill level requires to care for Dylan [name]. We talked about facility placement. We talked about his insurance. We talked about hiring private caregivers to help with ADL's. We talked about medicare and medicaid. [name] endorses she and Dylan [name] is not married. We talked about his finances. We talked about calling Social services to talk with Medicaid social worker for eligibility. [name] in agreement to contact private duty nursing for CNA help in addition to [place] worker at [employer]. [name] endorses she is looking ahead trying to plan if facility placement needed. [name] talked about daily routine. We talked about quality of life. Medical goals reviewed. We talked at length about [name] life with Dylan [name]. We talked about role of PC in POC. We talked about follow up pc visit in 6 weeks to monitor appetite, decline, progression of disease Parkinson. [name] agree, appointment scheduled. Therapeutic listening, emotional support provided. Questions answered to satisfaction. Contact information.   Palliative Care was asked to help to continue to address complex medical decision making.  CODE STATUS: DNR  PPS: 40% HOSPICE ELIGIBILITY/DIAGNOSIS: TBD  PAST  MEDICAL HISTORY:  Past Medical History:  Diagnosis Date  .  Anxiety   . Aortic stenosis    mild AS 11/24/16 (peak grad 24, mean grad 11) Dr. [name]  . CHB (complete heart block) ([place]) 07/2017  . 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[place]) 07/11/2017  . Diabetes mellitus without complication ([place])   . Encounter for care of pacemaker 07/11/2020  . Gait abnormality   . Gout   . Hyperlipemia   . Hypertension   . 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 Memory loss   . Pacemaker Dual chamber [place] MRI  model [id number] 07/11/2020 06/08/2020  . Peripheral arterial disease ([place])   . 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 Second degree AV block    Wenckebach; no indication for pacemaker as of 12/01/16 (Dr. [name])  . Vitamin B12 deficiency anemia 03/01/2018  . Vitamin D deficiency disease     SOCIAL HX:  Social History   Tobacco Use  . Smoking status: Former Smoker    Packs/day: 0.50    Years: 7.00    Pack years: 3.50    Types: Cigarettes  . Smokeless tobacco: Never Used  . Tobacco comment: quit 35 years  Substance Use Topics  . Alcohol use: No    ALLERGIES: No Known Allergies   PERTINENT MEDICATIONS:  Outpatient Encounter Medications as of 09/29/2020  Medication Sig  . Ascorbic Acid (VITAMIN C) 1000 MG tablet Take 1,000 mg by mouth every morning.   . carbidopa-levodopa (SINEMET IR) 25-100 MG tablet Take 2 tablets by mouth 3 (three) times daily.  . carvedilol (COREG) 12.5 MG tablet TAKE 1 TABLET BY MOUTH 2 TIMES DAILY.  [name] Cholecalciferol (VITAMIN D3) 5000 units CAPS Take 5,000 Units by mouth daily with supper.   . COLCRYS 0.6 MG tablet Take 1 tablet (0.6 mg total) by mouth daily. (Patient taking differently: Take 0.6 mg by mouth daily as needed. )  . desonide (DESOWEN) 0.05 % lotion Apply topically 2 times per day prn  . diclofenac Sodium (VOLTAREN) 1 % GEL Apply 2 g topically 3 (three) times daily. (Patient taking differently: Apply 2 g topically as needed. )  . glucose  blood (ONETOUCH VERIO) test strip Use as instructed to check blood sugars daily dx: e11.22  . Lancet Devices (ONE TOUCH DELICA LANCING DEV) MISC Please fill ONE TOUCH DELICA LANCING DEVICE.  Use to test blood sugar twice daily as directed. E11.65  . Lancets MISC Please fill basic/generic push button lancets.  Patient to test blood sugar twice daily. DX: E11.65  . Melatonin 10 MG TABS Take 10 mg by mouth at bedtime.   . memantine (NAMENDA) 10 MG tablet TAKE 1 TABLET BY MOUTH TWICE A DAY  . metFORMIN (GLUCOPHAGE) 1000 MG tablet TAKE 1 TABLET BY MOUTH EVERY DAY  . pravastatin (PRAVACHOL) 20 MG tablet TAKE 1 TABLET BY ORAL ROUTE EVERY DAY  . QUEtiapine (SEROQUEL) 25 MG tablet TAKE 1 TABLET (25 MG TOTAL) BY MOUTH AT BEDTIME AS NEEDED.  . rivastigmine (EXELON) 9.5 mg/24hr Place 1 patch (9.5 mg total) onto the skin daily.  . TRULICITY 1.5 FG/1.8EX SOPN INJECT 1.5 MG INTO THE SKIN ONCE A WEEK.   No facility-administered encounter medications on file as of 09/29/2020.    PHYSICAL EXAM:   Deferred  Park Beck Z Ethelda Deangelo, NP

## 2020-09-30 ENCOUNTER — Telehealth: Payer: Self-pay | Admitting: Cardiology

## 2020-09-30 NOTE — Telephone Encounter (Signed)
Called and spoke with patient regarding his remote pacemaker check results.

## 2020-10-02 ENCOUNTER — Ambulatory Visit: Payer: Medicare HMO

## 2020-10-02 ENCOUNTER — Telehealth: Payer: Self-pay

## 2020-10-02 DIAGNOSIS — E1122 Type 2 diabetes mellitus with diabetic chronic kidney disease: Secondary | ICD-10-CM

## 2020-10-02 DIAGNOSIS — N1831 Chronic kidney disease, stage 3a: Secondary | ICD-10-CM

## 2020-10-02 DIAGNOSIS — F0391 Unspecified dementia with behavioral disturbance: Secondary | ICD-10-CM

## 2020-10-02 DIAGNOSIS — G20 Parkinson's disease: Secondary | ICD-10-CM

## 2020-10-02 NOTE — Chronic Care Management (AMB) (Addendum)
Chronic Care Management    Social Work Follow Up Note  10/02/2020 Name: Dylan [name]. MRN: [id number] DOB: [birth year]  Dylan [name]. is a 79 y.o. year old male who is a primary care patient of [name], MD. The CCM team was consulted for assistance with care coordination.   Review of patient status, including review of consultants reports, other relevant assessments, and collaboration with appropriate care team members and the patient's provider was performed as part of comprehensive patient evaluation and provision of chronic care management services.    SDOH (Social Determinants of Health) assessments performed: No    Outpatient Encounter Medications as of 10/02/2020  Medication Sig  . Ascorbic Acid (VITAMIN C) 1000 MG tablet Take 1,000 mg by mouth every morning.   . carbidopa-levodopa (SINEMET IR) 25-100 MG tablet Take 2 tablets by mouth 3 (three) times daily.  . carvedilol (COREG) 12.5 MG tablet TAKE 1 TABLET BY MOUTH 2 TIMES DAILY.  [name] Cholecalciferol (VITAMIN D3) 5000 units CAPS Take 5,000 Units by mouth daily with supper.   . COLCRYS 0.6 MG tablet Take 1 tablet (0.6 mg total) by mouth daily. (Patient taking differently: Take 0.6 mg by mouth daily as needed. )  . desonide (DESOWEN) 0.05 % lotion Apply topically 2 times per day prn  . diclofenac Sodium (VOLTAREN) 1 % GEL Apply 2 g topically 3 (three) times daily. (Patient taking differently: Apply 2 g topically as needed. )  . 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 Lancet Devices (ONE TOUCH DELICA LANCING DEV) MISC Please fill ONE TOUCH DELICA LANCING DEVICE.  Use to test blood sugar twice daily as directed. E11.65  . Lancets MISC Please fill basic/generic push button lancets.  Patient to test blood sugar twice daily. DX: E11.65  . Melatonin 10 MG TABS Take 10 mg by mouth at bedtime.   . memantine (NAMENDA) 10 MG tablet TAKE 1 TABLET BY MOUTH TWICE A DAY  .  metFORMIN (GLUCOPHAGE) 1000 MG tablet TAKE 1 TABLET BY MOUTH EVERY DAY  . pravastatin (PRAVACHOL) 20 MG tablet TAKE 1 TABLET BY ORAL ROUTE EVERY DAY  . QUEtiapine (SEROQUEL) 25 MG tablet TAKE 1 TABLET (25 MG TOTAL) BY MOUTH AT BEDTIME AS NEEDED.  . rivastigmine (EXELON) 9.5 mg/24hr Place 1 patch (9.5 mg total) onto the skin daily.  . TRULICITY 1.5 XQ/1.1HE SOPN INJECT 1.5 MG INTO THE SKIN ONCE A WEEK.   No facility-administered encounter medications on file as of 10/02/2020.      Goals Addressed              This Visit's Progress     Patient Stated   .  "I would like some respite resources" (pt-stated)   On track     Caregiver stated:  [place] (see longitudinal plan of care for additional care plan information)  Current Barriers:  . 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 Inability to remain in the home alone . Chronic conditions including DM II, CKD III, Dementia, and Parkinsonism which put patient at increased risk for hospitalization  Social Work Clinical Goal(s):  [name] Over the next 90 days the patient and his caregiver will work with SW to identify respite resources . New Over the next 20 days the patient and his spouse will work with SW to be placed on the in home aid waiting list . New Over the next 45 days the patient and his  spouse will work with SW to identify adult day respite programs to assist with caregiver burnout  CCM SW Interventions: Completed 10/02/20 with [name] . Successful outbound call placed to the patients caregiver/spouse [name] to review care coordination needs . Determined [name] is interested in caregiver resources o Mrs Arsenio Loader reports physical pain due to assisting patient with transfers . Reviewed patient functional level at this time - patient needs assistance with bathing, dressing, feeding, ambulating. Patient will use a walker to exit his bedroom and walk to the kitchen but will then sit in a  wheelchair . Provided education on [facility] in home aid program o Obtained verbal permission to place patient on wait list o Outbound call placed to Inda Merlin and Betsey Amen - voice message left requesting a return call to place patient on waiting list o Inbound call received from Ms Baker Janus who provided SW with contact number to intake SW, Phillips Hay [phone] o Unsuccessful outbound call placed to Ms Geoffry Paradise- voice message left requesting a return call . Discussed opportunity to enroll patient in an adult day program o [name] reports she did contact [place] who informed her she would need to stay with the patient due to needing assistance with toileting o [place] would outreach [facility] to confirm this as they have many different programs o Collaboration with Vickki Muff requesting feedback on day program qualification for functioning level . Scheduled follow up call over the next 10 days . 2:45 pm- Received feedback from Vickki Muff the patient would be appropriate for the adult day center  o Outreach to [place] to inform of patients appropriateness and no need for her to stay and help care for the patient o Obtained verbal consent to place referral to Vickki Muff with [place] referral sent via e-mail correspondance  Completed 08/17/20 . Successful outbound call placed to Mrs Arsenio Loader to confirm receipt of mailed resources . Determined Mrs Arsenio Loader has received resources but has yet to review information . Scheduled follow up call over the next 60 days  Completed 08/05/20 . Collaboration with [place] who reports a voice message was received from Mrs Arsenio Loader requesting in home caregiver resources . Mailed in home caregiver resources to Mrs Arsenio Loader along with respite care information  Completed 07/31/20 with [name] Inter-disciplinary care team collaboration (see longitudinal plan of care) . Successful  outbound call placed to [name] to assist with care coordination needs o Mrs Arsenio Loader reports she is interested in more affordable respite options to assist for hours/day/weekends when she needs assistance caring for the patient . Educated Mrs Arsenio Loader on in home caregivers options to assist with a few hours when running errands o Mrs Arsenio Loader reports she is not willing to have a caregiver in the home while she is not present and declined this resource option . Educated Mrs Arsenio Loader on adult day program offered by [facility] for daily care o Advised Mrs Arsenio Loader this is a resource she would have to use and/or pay for consistently for staffing purposes o Mailed information on this resource for review . Discussed for weekend respite care she would need to look at ALF or SNF for out of home care o The patient has already accessed respite at [facility] several times; Mrs Arsenio Loader would like an option closer to home o Mailed a list of Assisted Living Facilities in [place] for review o Advised Mrs Colbert Ewing is not clear on each  ALF's respite guidelines at this time . Scheduled follow up over the next month  CCM RN CM Interventions:  08/05/20 call completed with spouse [name]   Inbound call received from [name] regarding the need for respite care for Dylan [name] for whom she is the primary caregiver  Determined [name] is using [facility] for over night respite when she needs to travel  Determined [name] would like resources for in home respite care for temporary periods of relief, she will be able to privately pay   Reinforced the importance for [name] to take time for herself and to balance her activity with rest  Encouraged [name] not to be afraid to ask family or friends for help when needed  Sent in basket message to embedded [place] with request to assist patient/caregiver with caregiver resources  Confirmed [name]  received the mailed educational material related to "Caregiver Stress and Burnout" and found this to be helpful   Discussed plans with [name] for ongoing care management follow up and confirmed she has the direct contact information for care management team  Patient Self Care Activities: With the help of Mrs Arsenio Loader  . Attends all scheduled provider appointments . Supportive caregiver to assist with patient care needs  Please see past updates related to this goal by clicking on the "Past Updates" button in the selected goal           Follow Up Plan: SW will follow up with patient by phone over the next 10 days.   Daneen Schick, BSW, CDP Social Worker, Certified Dementia Practitioner [place] [facility] [place] [facility] [phone]  Total time spent performing care coordination and/or care management activities with the patient by phone or face to face = 28 minutes.

## 2020-10-02 NOTE — Patient Instructions (Signed)
Social Worker Visit Information  Goals we discussed today:  Goals Addressed              This Visit's Progress     Patient Stated   .  "I would like some respite resources" (pt-stated)   On track     Caregiver stated:  [place] (see longitudinal plan of care for additional care plan information)  Current Barriers:  . 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 Inability to remain in the home alone . Chronic conditions including DM II, CKD III, Dementia, and Parkinsonism which put patient at increased risk for hospitalization  Social Work Clinical Goal(s):  [name] Over the next 90 days the patient and his caregiver will work with SW to identify respite resources . New Over the next 20 days the patient and his spouse will work with SW to be placed on the in home aid waiting list . New Over the next 45 days the patient and his spouse will work with SW to identify adult day respite programs to assist with caregiver burnout  CCM SW Interventions: Completed 10/02/20 with [name] . Successful outbound call placed to the patients caregiver/spouse [name] to review care coordination needs . Determined [name] is interested in caregiver resources o Mrs Arsenio Loader reports physical pain due to assisting patient with transfers . Reviewed patient functional level at this time - patient needs assistance with bathing, dressing, feeding, ambulating. Patient will use a walker to exit his bedroom and walk to the kitchen but will then sit in a wheelchair . Provided education on [facility] in home aid program o Obtained verbal permission to place patient on wait list o Outbound call placed to Inda Merlin and Betsey Amen - voice message left requesting a return call to place patient on waiting list o Inbound call received from Ms Baker Janus who provided SW with contact number to intake SW, Phillips Hay [phone] o Unsuccessful outbound call placed to Ms Geoffry Paradise-  voice message left requesting a return call . Discussed opportunity to enroll patient in an adult day program o [name] reports she did contact [place] who informed her she would need to stay with the patient due to needing assistance with toileting o [place] would outreach [facility] to confirm this as they have many different programs o Collaboration with Vickki Muff requesting feedback on day program qualification for functioning level . Scheduled follow up call over the next 10 days  Completed 08/17/20 . Successful outbound call placed to Mrs Arsenio Loader to confirm receipt of mailed resources . Determined Mrs Arsenio Loader has received resources but has yet to review information . Scheduled follow up call over the next 60 days  Completed 08/05/20 . Collaboration with [place] who reports a voice message was received from Mrs Arsenio Loader requesting in home caregiver resources . Mailed in home caregiver resources to Mrs Arsenio Loader along with respite care information  Completed 07/31/20 with [name] Inter-disciplinary care team collaboration (see longitudinal plan of care) . Successful outbound call placed to [name] to assist with care coordination needs o Mrs Arsenio Loader reports she is interested in more affordable respite options to assist for hours/day/weekends when she needs assistance caring for the patient . Educated Mrs Arsenio Loader on in home caregivers options to assist with a few hours when running errands o Mrs Arsenio Loader reports she is not willing to have a caregiver in the home while she is not present and declined  this resource option . Educated Mrs Arsenio Loader on adult day program offered by [facility] for daily care o Advised Mrs Arsenio Loader this is a resource she would have to use and/or pay for consistently for staffing purposes o Mailed information on this resource for review . Discussed for weekend respite care she would need to look at ALF or  SNF for out of home care o The patient has already accessed respite at [facility] several times; Mrs Arsenio Loader would like an option closer to home o Mailed a list of Assisted Living Facilities in [place] for review o Advised Mrs Colbert Ewing is not clear on each ALF's respite guidelines at this time . Scheduled follow up over the next month  CCM RN CM Interventions:  08/05/20 call completed with spouse [name]   Inbound call received from [name] regarding the need for respite care for Mr. Cozzens for whom she is the primary caregiver  Determined [name] is using [facility] for over night respite when she needs to travel  Determined [name] would like resources for in home respite care for temporary periods of relief, she will be able to privately pay   Reinforced the importance for [name] to take time for herself and to balance her activity with rest  Encouraged [name] not to be afraid to ask family or friends for help when needed  Sent in basket message to embedded [place] with request to assist patient/caregiver with caregiver resources  Confirmed [name] received the mailed educational material related to "Caregiver Stress and Burnout" and found this to be helpful   Discussed plans with [name] for ongoing care management follow up and confirmed she has the direct contact information for care management team  Patient Self Care Activities: With the help of Mrs Arsenio Loader  . Attends all scheduled provider appointments . Supportive caregiver to assist with patient care needs  Please see past updates related to this goal by clicking on the "Past Updates" button in the selected goal          Follow Up Plan: SW will follow up with patient by phone over the next 10 days.  Daneen Schick, BSW, CDP Social Worker, Certified Dementia Practitioner [place] [facility] [place] [facility] [phone]

## 2020-10-02 NOTE — Telephone Encounter (Signed)
  Chronic Care Management   Outreach Note  10/02/2020 Name: Dylan [name]. MRN: [id number] DOB: 02/14/1941  Referred by: [name], MD Reason for referral : Care Coordination   An unsuccessful telephone outreach was attempted today. The patient was referred to the case management team for assistance with care management and care coordination.   Follow Up Plan: A HIPAA compliant phone message was left for the patient providing contact information and requesting a return call.  The care management team will reach out to the patient again over the next 7 days.   Daneen Schick, BSW, CDP Social Worker, Certified Dementia Practitioner [place] [facility] [place] [facility] [phone]

## 2020-10-06 ENCOUNTER — Ambulatory Visit: Payer: Medicare HMO

## 2020-10-06 DIAGNOSIS — N1831 Chronic kidney disease, stage 3a: Secondary | ICD-10-CM

## 2020-10-06 DIAGNOSIS — G20 Parkinson's disease: Secondary | ICD-10-CM

## 2020-10-06 DIAGNOSIS — E1122 Type 2 diabetes mellitus with diabetic chronic kidney disease: Secondary | ICD-10-CM

## 2020-10-06 DIAGNOSIS — F0391 Unspecified dementia with behavioral disturbance: Secondary | ICD-10-CM

## 2020-10-06 NOTE — Patient Instructions (Signed)
Patient Goals/Self-Care Activities Over the next 30 days, patient will:   - Patient will self administer medications as prescribed -Patient will attend all scheduled provider appointments -Patient will call provider office for new concerns or questions -Patient will work with BSW to address care coordination needs and will continue to work with the clinical team to address health care and disease management related needs.   -Contact [facility] to learn more about the  Adult Day Program

## 2020-10-06 NOTE — Chronic Care Management (AMB) (Addendum)
Chronic Care Management    Social Work Follow Up Note  10/06/2020 Name: Dylan [name]. MRN: [id number] DOB: October 02, 1941  Dylan [name]. is a 79 y.o. year old male who is a primary care patient of [name], MD. The CCM team was consulted for assistance with care coordination.   Review of patient status, including review of consultants reports, other relevant assessments, and collaboration with appropriate care team members and the patient's provider was performed as part of comprehensive patient evaluation and provision of chronic care management services.    SW received a return call from Ms. Hunt with [employer].    Outpatient Encounter Medications as of 10/06/2020  Medication Sig  . Ascorbic Acid (VITAMIN C) 1000 MG tablet Take 1,000 mg by mouth every morning.   . carbidopa-levodopa (SINEMET IR) 25-100 MG tablet Take 2 tablets by mouth 3 (three) times daily.  . carvedilol (COREG) 12.5 MG tablet TAKE 1 TABLET BY MOUTH 2 TIMES DAILY.  [name] Cholecalciferol (VITAMIN D3) 5000 units CAPS Take 5,000 Units by mouth daily with supper.   . COLCRYS 0.6 MG tablet Take 1 tablet (0.6 mg total) by mouth daily. (Patient taking differently: Take 0.6 mg by mouth daily as needed. )  . desonide (DESOWEN) 0.05 % lotion Apply topically 2 times per day prn  . diclofenac Sodium (VOLTAREN) 1 % GEL Apply 2 g topically 3 (three) times daily. (Patient taking differently: Apply 2 g topically as needed. )  . 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 Lancet Devices (ONE TOUCH DELICA LANCING DEV) MISC Please fill ONE TOUCH DELICA LANCING DEVICE.  Use to test blood sugar twice daily as directed. E11.65  . Lancets MISC Please fill basic/generic push button lancets.  Patient to test blood sugar twice daily. DX: E11.65  . Melatonin 10 MG TABS Take 10 mg by mouth at bedtime.   . memantine (NAMENDA) 10 MG tablet TAKE 1 TABLET BY MOUTH TWICE A DAY  . metFORMIN  (GLUCOPHAGE) 1000 MG tablet TAKE 1 TABLET BY MOUTH EVERY DAY  . pravastatin (PRAVACHOL) 20 MG tablet TAKE 1 TABLET BY ORAL ROUTE EVERY DAY  . QUEtiapine (SEROQUEL) 25 MG tablet TAKE 1 TABLET (25 MG TOTAL) BY MOUTH AT BEDTIME AS NEEDED.  . rivastigmine (EXELON) 9.5 mg/24hr Place 1 patch (9.5 mg total) onto the skin daily.  . TRULICITY 1.5 DE/0.8XK SOPN INJECT 1.5 MG INTO THE SKIN ONCE A WEEK.   No facility-administered encounter medications on file as of 10/06/2020.     Patient Care Plan: Social Work Care Plan    Problem Identified: Care Coordination   Priority: Medium    Long-Range Goal: Caregiver Respite Resources   Start Date: 10/06/2020  Expected End Date: 01/04/2021  This Visit's Progress: On track  Priority: High  Note:   Current Barriers:  . Financial constraints related to cost of caregiver . Level of care concerns . Memory Deficits . Inability to perform ADL's independently . Inability to perform IADL's independently . Chronic conditions including DM II, CKD III, Parkinsonism, and Dementia which put patient at increased risk of hospitalization  Social Work Clinical Goal(s):  [name] Over the next 90 days, patient will work with SW to address concerns related to caregiver resources  SW Interventions: . Inter-disciplinary care team collaboration (see longitudinal plan of care) . Inbound call received from Ms. Hunt with [employer] . Successfully placed patient on wait list for in home aid program . Scheduled follow  up call over the next 10 days  Patient Goals/Self-Care Activities Over the next 30 days, patient will: With the help of [name]  - Patient will self administer medications as prescribed -Patient will attend all scheduled provider appointments -Patient will call provider office for new concerns or questions -Patient will work with BSW to address care coordination needs and will continue to work with the clinical team to address health care and disease management  related needs.   -Contact [facility] to learn more about the  Adult Day Program  Follow up Plan:  SW will follow up with the patient over the next 10 days  See patient stated goal for previous interventions related to respite resources        Follow Up Plan: SW will follow up with patient by phone over the next 10 days.   Daneen Schick, BSW, CDP Social Worker, Certified Dementia Practitioner [place] [facility] [place] [facility] [phone]  Total time spent performing care coordination and/or care management activities with the patient by phone or face to face = 10 minutes.

## 2020-10-07 ENCOUNTER — Other Ambulatory Visit: Payer: Self-pay

## 2020-10-07 ENCOUNTER — Emergency Department (HOSPITAL_COMMUNITY): Payer: Medicare HMO

## 2020-10-07 ENCOUNTER — Emergency Department (HOSPITAL_COMMUNITY)
Admission: EM | Admit: 2020-10-07 | Discharge: 2020-10-08 | Disposition: A | Discharge status: Home / Self Care | Payer: Medicare HMO | Attending: Emergency Medicine | Admitting: Emergency Medicine

## 2020-10-07 DIAGNOSIS — R55 Syncope and collapse: Secondary | ICD-10-CM | POA: Diagnosis not present

## 2020-10-07 DIAGNOSIS — E1122 Type 2 diabetes mellitus with diabetic chronic kidney disease: Secondary | ICD-10-CM | POA: Diagnosis not present

## 2020-10-07 DIAGNOSIS — Z950 Presence of cardiac pacemaker: Secondary | ICD-10-CM | POA: Insufficient documentation

## 2020-10-07 DIAGNOSIS — Z87891 Personal history of nicotine dependence: Secondary | ICD-10-CM | POA: Insufficient documentation

## 2020-10-07 DIAGNOSIS — Z7984 Long term (current) use of oral hypoglycemic drugs: Secondary | ICD-10-CM | POA: Diagnosis not present

## 2020-10-07 DIAGNOSIS — Z79899 Other long term (current) drug therapy: Secondary | ICD-10-CM | POA: Diagnosis not present

## 2020-10-07 DIAGNOSIS — Z794 Long term (current) use of insulin: Secondary | ICD-10-CM | POA: Diagnosis not present

## 2020-10-07 DIAGNOSIS — I129 Hypertensive chronic kidney disease with stage 1 through stage 4 chronic kidney disease, or unspecified chronic kidney disease: Secondary | ICD-10-CM | POA: Diagnosis not present

## 2020-10-07 DIAGNOSIS — F0391 Unspecified dementia with behavioral disturbance: Secondary | ICD-10-CM | POA: Diagnosis not present

## 2020-10-07 DIAGNOSIS — N1831 Chronic kidney disease, stage 3a: Secondary | ICD-10-CM | POA: Insufficient documentation

## 2020-10-07 DIAGNOSIS — G20 Parkinson's disease: Secondary | ICD-10-CM | POA: Diagnosis not present

## 2020-10-07 LAB — TROPONIN I (HIGH SENSITIVITY)
Troponin I (High Sensitivity): 12 ng/L (ref <18)
Troponin I (High Sensitivity): 11 ng/L (ref <18)

## 2020-10-07 LAB — CBG MONITORING, ED: Glucose-Capillary: 162 mg/dL — ABNORMAL HIGH (ref 70–99)

## 2020-10-07 LAB — BASIC METABOLIC PANEL
Anion gap: 12 (ref 5–15)
BUN: 18 mg/dL (ref 8–23)
CO2: 25 mmol/L (ref 22–32)
Calcium: 9.5 mg/dL (ref 8.9–10.3)
Chloride: 103 mmol/L (ref 98–111)
Creatinine, Ser: 1.15 mg/dL (ref 0.61–1.24)
GFR, Estimated: >60 mL/min (ref >60)
Glucose, Bld: 150 mg/dL — ABNORMAL HIGH (ref 70–99)
Potassium: 4.2 mmol/L (ref 3.5–5.1)
Sodium: 140 mmol/L (ref 135–145)

## 2020-10-07 LAB — CBC
HCT: 37.7 % — ABNORMAL LOW (ref 39.0–52.0)
Hemoglobin: 12.3 g/dL — ABNORMAL LOW (ref 13.0–17.0)
MCH: 29.1 pg (ref 26.0–34.0)
MCHC: 32.6 g/dL (ref 30.0–36.0)
MCV: 89.3 fL (ref 80.0–100.0)
Platelets: 174 10*3/uL (ref 150–400)
RBC: 4.22 MIL/uL (ref 4.22–5.81)
RDW: 12 % (ref 11.5–15.5)
WBC: 5.4 10*3/uL (ref 4.0–10.5)
nRBC: 0 % (ref 0.0–0.2)

## 2020-10-07 NOTE — Discharge Instructions (Signed)
You have near syncope.  We interrogated your pacemaker and there were no events.  Your labs including your heart function is normal today.  Follow-up with Dr. [name] in his office  Return to ER if you have another episode of passing out, chest pain, trouble breathing.

## 2020-10-07 NOTE — ED Triage Notes (Signed)
Pt here from home with c/o a syncopal epsiode , pt was out for about 5 mins per ems , pt arrives to the ED alert and oriented

## 2020-10-07 NOTE — ED Notes (Signed)
Pt is up for discharge, wife at bedside is aware of discharge instructions. Pt and wife unable to go home by themselves as pt needs a walker and unsteady on his own feet. Pt will be brought home thru [place], request already made to Secretary.   Wife on the way home now.

## 2020-10-07 NOTE — ED Notes (Signed)
Offered apple sauce, graham crackers and ginger ale to both pt and wife at this time.

## 2020-10-07 NOTE — ED Notes (Addendum)
Spoke with [place] representative Aaron Edelman) at this time - info will be faxed. "pt is rate controlled" per representative.

## 2020-10-07 NOTE — ED Provider Notes (Signed)
[facility] EMERGENCY DEPARTMENT Provider Note   CSN: [id number] Arrival date & time: 10/07/20  1247     History No chief complaint on file.   Dylan [name]. is a 79 y.o. 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  Patient states that he was on the commode and then may have passed out for about 5 minutes.  Patient did not hit his head.  Patient denies any chest pain or shortness of breath.  Patient states that he did not have a seizure and did not urinate on himself.   The history is provided by the patient.       Past Medical History:  Diagnosis Date  . Anxiety   . Aortic stenosis    mild AS 11/24/16 (peak grad 24, mean grad 11) Dr. [name]  . CHB (complete heart block) ([place]) 07/2017  . 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 Diabetes mellitus without complication ([place])   . Encounter for care of pacemaker 07/11/2020  . Gait abnormality   . Gout   . Hyperlipemia   . Hypertension   . 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 Memory loss   . Pacemaker Dual chamber [place] MRI  model [id number] 07/11/2020 06/08/2020  . Peripheral arterial disease ([place])   . 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 Second degree AV block    Wenckebach; no indication for pacemaker as of 12/01/16 (Dr. [name])  . Vitamin B12 deficiency anemia 03/01/2018  . Vitamin D deficiency disease     Patient Active Problem List   Diagnosis Date Noted  . Encounter for care of pacemaker 07/11/2020  . Pacemaker Dual chamber [place] MRI  model [id number] 07/11/2020 06/08/2020  . 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 DNR (do not resuscitate) 06/08/2020  . Elevated CK 04/27/2020  . Gout   . Hyperlipemia   . Parkinsonism ([place]) 01/29/2020  . Lung granuloma ([place]) 07/24/2019  . Dementia with behavioral disturbance ([place])  05/22/2019  . Gait abnormality 05/22/2019  . AKI (acute kidney injury) ([place]) 07/14/2018  . Nephropathy 07/14/2018  . Other disorders of lung 07/14/2018  . Atherosclerosis of aorta ([place]) 07/14/2018  . Dehydration 07/14/2018  . 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 CKD (chronic kidney disease), stage III ([place]) 06/13/2018  . Syncope 06/13/2018  . 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[place]) 06/23/2015  . Peripheral arterial disease ([place]) 05/13/2014  . Essential hypertension 05/13/2014  . Type 2 diabetes mellitus with stage 3a chronic kidney disease, without long-term current use of insulin ([place]) 05/13/2014    Past Surgical History:  Procedure Laterality Date  . CARDIOVASCULAR STRESS TEST     11/21/16 Low risk study, EF 52% [facility])  . EYE SURGERY    . KYPHOPLASTY N/A 02/15/2017   Procedure: LUMBAR FOUR KYPHOPLASTY;  Surgeon: Phylliss Bob, MD;  Location: [facility];  Service: Orthopedics;  Laterality: N/A;  . lipoma surgery     neck - 30 years ago  . PACEMAKER IMPLANT N/A 07/11/2017   Procedure: Pacemaker Implant;  Surgeon: Constance Haw, MD;  Location: [facility] CV LAB;  Service: Cardiovascular;  Laterality: N/A;  . TRANSTHORACIC ECHOCARDIOGRAM     11/24/16 [facility] CV): EF 55-60%, mild AS, mild-mod MR, mod TR, moderate pulm HTN, PAP 49 mmHg  Family History  Problem Relation Age of Onset  . Diabetes Mother   . Breast cancer Mother   . Arthritis Mother   . Hypertension Father   . Diabetes Sister   . Diabetes Brother   . Diabetes Maternal Grandmother   . Diabetes Brother   . Diabetes Brother   . Diabetes Brother   . Diabetes Sister   . Diabetes Sister   . Diabetes Sister     Social History   Tobacco Use  . Smoking status: Former Smoker    Packs/day: 0.50    Years: 7.00    Pack years: 3.50    Types: Cigarettes  . Smokeless tobacco: Never Used  . Tobacco comment: quit 35 years  Vaping Use   . Vaping Use: Never used  Substance Use Topics  . Alcohol use: No  . Drug use: No    Home Medications Prior to Admission medications   Medication Sig Start Date End Date Taking? Authorizing Provider  Ascorbic Acid (VITAMIN C) 1000 MG tablet Take 1,000 mg by mouth every morning.    Yes [provider]  carbidopa-levodopa (SINEMET IR) 25-100 MG tablet Take 2 tablets by mouth 3 (three) times daily. 07/27/20  Yes Marcial Pacas, MD  carvedilol (COREG) 12.5 MG tablet TAKE 1 TABLET BY MOUTH 2 TIMES DAILY. Patient taking differently: TAKE 1 TABLET BY MOUTH 2 TIMES DAILY. 07/07/20  Yes [name], MD  Cholecalciferol (VITAMIN D3) 5000 units CAPS Take 5,000 Units by mouth daily with supper.    Yes [provider]  COLCRYS 0.6 MG tablet Take 1 tablet (0.6 mg total) by mouth daily. Patient taking differently: Take 0.6 mg by mouth daily as needed (gout flare).  10/12/18  Yes [name], MD  diclofenac Sodium (VOLTAREN) 1 % GEL Apply 2 g topically 3 (three) times daily. Patient taking differently: Apply 2 g topically daily as needed (pain).  10/10/19  Yes Rodriguez-[place]worth, Sunday Spillers, PA-C  Melatonin 10 MG TABS Take 10 mg by mouth at bedtime.    Yes [provider]  memantine (NAMENDA) 10 MG tablet TAKE 1 TABLET BY MOUTH TWICE A DAY Patient taking differently: Take 10 mg by mouth 2 (two) times daily.  07/07/20  Yes [name], MD  metFORMIN (GLUCOPHAGE) 1000 MG tablet TAKE 1 TABLET BY MOUTH EVERY DAY Patient taking differently: Take 1,000 mg by mouth 2 (two) times daily with a meal.  08/05/20  Yes [name], MD  pravastatin (PRAVACHOL) 20 MG tablet TAKE 1 TABLET BY ORAL ROUTE EVERY DAY Patient taking differently: Take 20 mg by mouth daily.  09/02/20  Yes [name], MD  rivastigmine (EXELON) 9.5 mg/24hr Place 1 patch (9.5 mg total) onto the skin daily. 07/27/20  Yes Marcial Pacas, MD  TRULICITY 1.5 OF/7.5ZW SOPN INJECT 1.5 MG INTO THE SKIN ONCE A WEEK. Patient taking  differently: Inject 1.5 mg into the skin every Monday.  09/07/20  Yes [name], MD  desonide (DESOWEN) 0.05 % lotion Apply topically 2 times per day prn Patient not taking: Reported on 10/07/2020 06/17/20   [name], MD  glucose blood [facility] VERIO) test strip Use as instructed to check blood sugars daily dx: e11.22 01/06/20   [name], MD  Lancet Devices (ONE TOUCH DELICA LANCING DEV) MISC Please fill ONE TOUCH DELICA LANCING DEVICE.  Use to test blood sugar twice daily as directed. E11.65 11/15/19   [name], MD  Lancets MISC Please fill basic/generic push button lancets.  Patient to test blood sugar twice  daily. DX: E11.65 12/06/19   [name], MD  QUEtiapine (SEROQUEL) 25 MG tablet TAKE 1 TABLET (25 MG TOTAL) BY MOUTH AT BEDTIME AS NEEDED. Patient not taking: Reported on 10/07/2020 08/21/20   Marcial Pacas, MD    Allergies    Patient has no known allergies.  Review of Systems   Review of Systems  Neurological: Positive for syncope.  All other systems reviewed and are negative.   Physical Exam Updated Vital Signs BP (!) 181/76 (BP Location: Right Arm)   Pulse 76   Temp 98.1 F (36.7 C) (Oral)   Resp (!) 24   SpO2 100%   Physical Exam Vitals and nursing note reviewed.  Constitutional:      Comments: Chronically ill-appearing  HENT:     Head: Normocephalic and atraumatic.     Nose: Nose normal.     Mouth/Throat:     Mouth: Mucous membranes are moist.  Eyes:     Extraocular Movements: Extraocular movements intact.     Pupils: Pupils are equal, round, and reactive to light.  Cardiovascular:     Rate and Rhythm: Normal rate and regular rhythm.     Pulses: Normal pulses.     Heart sounds: Normal heart sounds.  Pulmonary:     Effort: Pulmonary effort is normal.     Breath sounds: Normal breath sounds.  Abdominal:     General: Abdomen is flat.     Palpations: Abdomen is soft.  Musculoskeletal:        General: Normal range of motion.     Cervical back:  Normal range of motion and neck supple.  Skin:    General: Skin is warm.     Capillary Refill: Capillary refill takes less than 2 seconds.  Neurological:     General: No focal deficit present.     Mental Status: He is oriented to person, place, and time.  Psychiatric:        Mood and Affect: Mood normal.     ED Results / Procedures / Treatments   Labs (all labs ordered are listed, but only abnormal results are displayed) Labs Reviewed  BASIC METABOLIC PANEL - Abnormal; Notable for the following components:      Result Value   Glucose, Bld 150 (*)    All other components within normal limits  CBC - Abnormal; Notable for the following components:   Hemoglobin 12.3 (*)    HCT 37.7 (*)    All other components within normal limits  CBG MONITORING, ED - Abnormal; Notable for the following components:   Glucose-Capillary 162 (*)    All other components within normal limits  TROPONIN I (HIGH SENSITIVITY)  TROPONIN I (HIGH SENSITIVITY)    EKG EKG Interpretation  Date/Time:  Wednesday October 07 2020 12:54:40 EST Ventricular Rate:  64 PR Interval:  198 QRS Duration: 140 QT Interval:  442 QTC Calculation: 455 R Axis:   -69 Text Interpretation: Atrial-sensed ventricular-paced rhythm Abnormal ECG No significant change since last tracing Confirmed by Wandra Arthurs [phone]) on 10/07/2020 6:27:11 PM   Radiology DG Chest 2 View  Result Date: 10/07/2020 CLINICAL DATA:  Weakness.  Syncopal episode.  Cough. EXAM: CHEST - 2 VIEW COMPARISON:  04/27/2020 FINDINGS: Heart size is normal. Dual lead pacemaker appears unchanged. Chronic aortic atherosclerosis. The lungs are clear. No infiltrate, edema, collapse or effusion. No significant bone finding. IMPRESSION: No active disease. Pacemaker. Aortic atherosclerosis. Electronically Signed   By: Nelson Chimes M.D.   On: 10/07/2020 13:54  Procedures Procedures (including critical care time)  Medications Ordered in ED Medications - No data to  display  ED Course  I have reviewed the triage vital signs and the nursing notes.  Pertinent labs & imaging results that were available during my care of the patient were reviewed by me and considered in my medical decision making (see chart for details).    MDM Rules/Calculators/A&P                         Blaire Hodsdon. is a 79 y.o. male here with syncope.  Patient was on the commode and had a near syncopal episode.  Patient does have a history of complete heart block and has a English as a second language teacher.  Will check basic labs and interrogate the pacemaker.  Patient did not have any seizure-like activity.  Patient is also has Parkinson's so it could be related to dementia.  9:30 PM Troponin negative x2.  Patient observed in the ED for over 8 hours had no recurrent syncopal episode.  Pacemaker is interrogated and there is atrial tachycardia that lasts less than 5 seconds.  There is no other arrhythmia.  This has been happening previously and is not a new event. At this point, patient can be discharged and follow with cardiology outpatient   Final Clinical Impression(s) / ED Diagnoses Final diagnoses:  None    Rx / DC Orders ED Discharge Orders    None       Drenda Freeze, MD 10/07/20 2131

## 2020-10-07 NOTE — ED Notes (Signed)
Changed pt into new diaper and into own clothes, awaiting for PTAR.

## 2020-10-08 NOTE — ED Notes (Signed)
PTAR arrived at bedside.report given.

## 2020-10-12 ENCOUNTER — Ambulatory Visit: Payer: Medicare HMO | Admitting: Podiatry

## 2020-10-12 ENCOUNTER — Encounter: Payer: Self-pay | Admitting: Podiatry

## 2020-10-12 ENCOUNTER — Other Ambulatory Visit: Payer: Self-pay

## 2020-10-12 DIAGNOSIS — M79674 Pain in right toe(s): Secondary | ICD-10-CM

## 2020-10-12 DIAGNOSIS — E1151 Type 2 diabetes mellitus with diabetic peripheral angiopathy without gangrene: Secondary | ICD-10-CM

## 2020-10-12 DIAGNOSIS — M79675 Pain in left toe(s): Secondary | ICD-10-CM

## 2020-10-12 DIAGNOSIS — B351 Tinea unguium: Secondary | ICD-10-CM

## 2020-10-13 ENCOUNTER — Ambulatory Visit: Payer: Self-pay

## 2020-10-13 DIAGNOSIS — F0391 Unspecified dementia with behavioral disturbance: Secondary | ICD-10-CM

## 2020-10-13 DIAGNOSIS — N1831 Chronic kidney disease, stage 3a: Secondary | ICD-10-CM

## 2020-10-13 DIAGNOSIS — E1122 Type 2 diabetes mellitus with diabetic chronic kidney disease: Secondary | ICD-10-CM

## 2020-10-13 DIAGNOSIS — G20 Parkinson's disease: Secondary | ICD-10-CM

## 2020-10-13 NOTE — Chronic Care Management (AMB) (Signed)
Chronic Care Management    Social Work Follow Up Note  10/13/2020 Name: Dylan [name]. MRN: [id number] DOB: 06/28/1941  Dylan [name]. is a 79 y.o. year old male who is a primary care patient of [name], MD. The CCM team was consulted for assistance with care coordination.   Review of patient status, including review of consultants reports, other relevant assessments, and collaboration with appropriate care team members and the patient's provider was performed as part of comprehensive patient evaluation and provision of chronic care management services.    SDOH (Social Determinants of Health) assessments performed: No    Outpatient Encounter Medications as of 10/13/2020  Medication Sig  . Ascorbic Acid (VITAMIN C) 1000 MG tablet Take 1,000 mg by mouth every morning.   . carbidopa-levodopa (SINEMET IR) 25-100 MG tablet Take 2 tablets by mouth 3 (three) times daily.  . carvedilol (COREG) 12.5 MG tablet TAKE 1 TABLET BY MOUTH 2 TIMES DAILY. (Patient taking differently: TAKE 1 TABLET BY MOUTH 2 TIMES DAILY.)  . Cholecalciferol (VITAMIN D3) 5000 units CAPS Take 5,000 Units by mouth daily with supper.   . COLCRYS 0.6 MG tablet Take 1 tablet (0.6 mg total) by mouth daily. (Patient taking differently: Take 0.6 mg by mouth daily as needed (gout flare). )  . desonide (DESOWEN) 0.05 % lotion Apply topically 2 times per day prn (Patient not taking: Reported on 10/07/2020)  . diclofenac Sodium (VOLTAREN) 1 % GEL Apply 2 g topically 3 (three) times daily. (Patient taking differently: Apply 2 g topically daily as needed (pain). )  . 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 Lancet Devices (ONE TOUCH DELICA LANCING DEV) MISC Please fill ONE TOUCH DELICA LANCING DEVICE.  Use to test blood sugar twice daily as directed. E11.65  . Lancets MISC Please fill basic/generic push button lancets.  Patient to test blood sugar twice daily. DX:  E11.65  . Melatonin 10 MG TABS Take 10 mg by mouth at bedtime.   . memantine (NAMENDA) 10 MG tablet TAKE 1 TABLET BY MOUTH TWICE A DAY (Patient taking differently: Take 10 mg by mouth 2 (two) times daily. )  . metFORMIN (GLUCOPHAGE) 1000 MG tablet TAKE 1 TABLET BY MOUTH EVERY DAY (Patient taking differently: Take 1,000 mg by mouth 2 (two) times daily with a meal. )  . pravastatin (PRAVACHOL) 20 MG tablet TAKE 1 TABLET BY ORAL ROUTE EVERY DAY (Patient taking differently: Take 20 mg by mouth daily. )  . QUEtiapine (SEROQUEL) 25 MG tablet TAKE 1 TABLET (25 MG TOTAL) BY MOUTH AT BEDTIME AS NEEDED. (Patient not taking: Reported on 10/07/2020)  . rivastigmine (EXELON) 9.5 mg/24hr Place 1 patch (9.5 mg total) onto the skin daily.  . TRULICITY 1.5 BT/5.1VO SOPN INJECT 1.5 MG INTO THE SKIN ONCE A WEEK. (Patient taking differently: Inject 1.5 mg into the skin every Monday. )   No facility-administered encounter medications on file as of 10/13/2020.     Patient Care Plan: Social Work Care Plan    Problem Identified: Care Coordination   Priority: Medium    Long-Range Goal: Caregiver Respite Resources   Start Date: 10/06/2020  Expected End Date: 01/04/2021  This Visit's Progress: On track  Recent Progress: On track  Priority: High  Note:   Current Barriers:  . Financial constraints related to cost of caregiver . Level of care concerns . Memory Deficits . Inability to perform ADL's independently . Inability to perform IADL's  independently . Chronic conditions including DM II, CKD III, Parkinsonism, and Dementia which put patient at increased risk of hospitalization  Social Work Clinical Goal(s):  [name] Over the next 90 days, patient will work with SW to address concerns related to caregiver resources  SW Interventions: . Inter-disciplinary care team collaboration (see longitudinal plan of care) . Inbound call received from [name] to request information on how to become a paid caregiver for the  patient . Discussed she has hired someone to assist with patient care needs but is not satisfied stating the patient has experienced 1 fall while being under the care of the hired caregiver. The hired caregiver has also sent the patient to the ED when the patients spouse was out of the house . [name] stated "I think I have done a better job than her" . Educated B[place] on the process to be a paid caregiver through [place] . Reminded [name] she stated she was interested in caregiver resources due to her physical inability to care for the patient on her own . Determined [name] plans to contact DSS to gain knowledge on how to be a paid caregiver for the patient  Patient Goals/Self-Care Activities Over the next 30 days, patient will: With the help of [name]  - Patient will self administer medications as prescribed -Patient will attend all scheduled provider appointments -Patient will call provider office for new concerns or questions -Patient will work with BSW to address care coordination needs and will continue to work with the clinical team to address health care and disease management related needs.   -Contact DSS to learn more about becoming a paid caregiver  Follow up Plan:  SW will follow up with the patient over the next 14 days      Daneen Schick, BSW, CDP Social Worker, Certified Dementia Practitioner [facility] [phone]  Total time spent performing care coordination and/or care management activities with the patient by phone or face to face = 10 minutes.

## 2020-10-13 NOTE — Patient Instructions (Signed)
Goals we discussed today:  Goals Addressed            This Visit's Progress   . Work with SW to manage care coordination needs       Timeframe:  Short-Term Goal Priority:  High Start Date:   12.7.21                        Expected End Date:   3.7.22                    Next expected date of contact: 10/27/20   Patient Goals/Self-Care Activities Over the next 30 days, patient will: With the help of [name]  - Patient will self administer medications as prescribed -Patient will attend all scheduled provider appointments -Patient will call provider office for new concerns or questions -Patient will work with BSW to address care coordination needs and will continue to work with the clinical team to address health care and disease management related needs.   -Contact DSS to learn more about becoming a paid caregiver

## 2020-10-14 ENCOUNTER — Telehealth: Payer: Medicare HMO

## 2020-10-15 ENCOUNTER — Emergency Department (HOSPITAL_COMMUNITY): Payer: Medicare HMO

## 2020-10-15 ENCOUNTER — Other Ambulatory Visit: Payer: Self-pay

## 2020-10-15 ENCOUNTER — Ambulatory Visit: Payer: Self-pay

## 2020-10-15 ENCOUNTER — Telehealth: Payer: Self-pay | Admitting: Neurology

## 2020-10-15 ENCOUNTER — Telehealth: Payer: Medicare HMO

## 2020-10-15 ENCOUNTER — Emergency Department (HOSPITAL_COMMUNITY)
Admission: EM | Admit: 2020-10-15 | Discharge: 2020-10-16 | Disposition: A | Discharge status: Home / Self Care | Payer: Medicare HMO | Attending: Emergency Medicine | Admitting: Emergency Medicine

## 2020-10-15 DIAGNOSIS — Z87891 Personal history of nicotine dependence: Secondary | ICD-10-CM | POA: Insufficient documentation

## 2020-10-15 DIAGNOSIS — I1310 Hypertensive heart and chronic kidney disease without heart failure, with stage 1 through stage 4 chronic kidney disease, or unspecified chronic kidney disease: Secondary | ICD-10-CM | POA: Insufficient documentation

## 2020-10-15 DIAGNOSIS — R32 Unspecified urinary incontinence: Secondary | ICD-10-CM | POA: Insufficient documentation

## 2020-10-15 DIAGNOSIS — Z7984 Long term (current) use of oral hypoglycemic drugs: Secondary | ICD-10-CM | POA: Insufficient documentation

## 2020-10-15 DIAGNOSIS — N1831 Chronic kidney disease, stage 3a: Secondary | ICD-10-CM | POA: Diagnosis not present

## 2020-10-15 DIAGNOSIS — R456 Violent behavior: Secondary | ICD-10-CM | POA: Diagnosis present

## 2020-10-15 DIAGNOSIS — Z20822 Contact with and (suspected) exposure to covid-19: Secondary | ICD-10-CM | POA: Insufficient documentation

## 2020-10-15 DIAGNOSIS — F0391 Unspecified dementia with behavioral disturbance: Secondary | ICD-10-CM

## 2020-10-15 DIAGNOSIS — Z79899 Other long term (current) drug therapy: Secondary | ICD-10-CM | POA: Diagnosis not present

## 2020-10-15 DIAGNOSIS — N183 Chronic kidney disease, stage 3 unspecified: Secondary | ICD-10-CM

## 2020-10-15 DIAGNOSIS — E1122 Type 2 diabetes mellitus with diabetic chronic kidney disease: Secondary | ICD-10-CM | POA: Diagnosis not present

## 2020-10-15 DIAGNOSIS — Z950 Presence of cardiac pacemaker: Secondary | ICD-10-CM | POA: Diagnosis not present

## 2020-10-15 DIAGNOSIS — G20 Parkinson's disease: Secondary | ICD-10-CM | POA: Insufficient documentation

## 2020-10-15 DIAGNOSIS — R451 Restlessness and agitation: Secondary | ICD-10-CM | POA: Insufficient documentation

## 2020-10-15 DIAGNOSIS — I10 Essential (primary) hypertension: Secondary | ICD-10-CM

## 2020-10-15 DIAGNOSIS — F03911 Unspecified dementia, unspecified severity, with agitation: Secondary | ICD-10-CM

## 2020-10-15 LAB — CBC WITH DIFFERENTIAL/PLATELET
Abs Immature Granulocytes: 0.01 10*3/uL (ref 0.00–0.07)
Basophils Absolute: 0.1 10*3/uL (ref 0.0–0.1)
Basophils Relative: 1 %
Eosinophils Absolute: 0.2 10*3/uL (ref 0.0–0.5)
Eosinophils Relative: 5 %
HCT: 37 % — ABNORMAL LOW (ref 39.0–52.0)
Hemoglobin: 12.6 g/dL — ABNORMAL LOW (ref 13.0–17.0)
Immature Granulocytes: 0 %
Lymphocytes Relative: 22 %
Lymphs Abs: 1.1 10*3/uL (ref 0.7–4.0)
MCH: 29.9 pg (ref 26.0–34.0)
MCHC: 34.1 g/dL (ref 30.0–36.0)
MCV: 87.7 fL (ref 80.0–100.0)
Monocytes Absolute: 0.6 10*3/uL (ref 0.1–1.0)
Monocytes Relative: 11 %
Neutro Abs: 3.1 10*3/uL (ref 1.7–7.7)
Neutrophils Relative %: 61 %
Platelets: 178 10*3/uL (ref 150–400)
RBC: 4.22 MIL/uL (ref 4.22–5.81)
RDW: 11.9 % (ref 11.5–15.5)
WBC: 5.1 10*3/uL (ref 4.0–10.5)
nRBC: 0 % (ref 0.0–0.2)

## 2020-10-15 LAB — URINALYSIS, ROUTINE W REFLEX MICROSCOPIC
Bilirubin Urine: NEGATIVE
Glucose, UA: NEGATIVE mg/dL
Hgb urine dipstick: NEGATIVE
Ketones, ur: NEGATIVE mg/dL
Leukocytes,Ua: NEGATIVE
Nitrite: NEGATIVE
Protein, ur: NEGATIVE mg/dL
Specific Gravity, Urine: 1.009 (ref 1.005–1.030)
pH: 6 (ref 5.0–8.0)

## 2020-10-15 LAB — RESP PANEL BY RT-PCR (FLU A&B, COVID) ARPGX2
Influenza A by PCR: NEGATIVE
Influenza B by PCR: NEGATIVE
SARS Coronavirus 2 by RT PCR: NEGATIVE

## 2020-10-15 LAB — COMPREHENSIVE METABOLIC PANEL
ALT: <5 U/L (ref 0–44)
AST: 16 U/L (ref 15–41)
Albumin: 3.4 g/dL — ABNORMAL LOW (ref 3.5–5.0)
Alkaline Phosphatase: 106 U/L (ref 38–126)
Anion gap: 13 (ref 5–15)
BUN: 15 mg/dL (ref 8–23)
CO2: 25 mmol/L (ref 22–32)
Calcium: 9.4 mg/dL (ref 8.9–10.3)
Chloride: 102 mmol/L (ref 98–111)
Creatinine, Ser: 1.04 mg/dL (ref 0.61–1.24)
GFR, Estimated: >60 mL/min (ref >60)
Glucose, Bld: 165 mg/dL — ABNORMAL HIGH (ref 70–99)
Potassium: 4.2 mmol/L (ref 3.5–5.1)
Sodium: 140 mmol/L (ref 135–145)
Total Bilirubin: 0.5 mg/dL (ref 0.3–1.2)
Total Protein: 6.8 g/dL (ref 6.5–8.1)

## 2020-10-15 LAB — AMMONIA: Ammonia: 20 umol/L (ref 9–35)
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
## 2020-10-15 NOTE — Discharge Instructions (Addendum)
You are seen in the hospital for increased agitation and some urinary symptoms.  You had blood work and a urinalysis that did not show any significant abnormalities.  Will be important for you to follow-up with your neurologist and primary care doctor.  Return to the emergency department if any worsening or concerning symptoms.
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
## 2020-10-15 NOTE — ED Notes (Signed)
Dylan [name] called [facility] at 19:54. Pt. 9th in line for transport.

## 2020-10-15 NOTE — ED Triage Notes (Signed)
Patient BIB [facility] for combative behavior per wife. Wife is patient's primary caregiver, states patient has become combative over the last several days, denies any other changes. Patient is alert and oriented to self, situation, and place. Disoriented to time.

## 2020-10-15 NOTE — Telephone Encounter (Signed)
I spoke to the patient's wife. He is currently in the ED and a urinalysis is being completed.   I spoke to Dr. Krista Blue. She wanted to know if the quetiapine 25mg , one tab QHS has been helpful.  His wife reports that she never gave it to him. I instructed her to start the medication and explained it's purpose. She verbalized understanding.  Dr. Krista Blue is out of the office for two weeks on vacation but wishes the patient to be evaluated in the office. She ask that he be scheduled in an open slot with Sarah to follow up on his condition. He has been scheduled on 10/26/20 at 1:45pm (check-in 1:15pm). His wife wrote the appt information down.

## 2020-10-15 NOTE — Patient Instructions (Addendum)
Visit Information   .  Effective Caregiver Coping        Timeframe:  Long-Range Goal Priority:  High Start Date: 10/15/20                            Expected End Date: 01/13/21  Next scheduled follow up   11/05/19                      Over the next 90 days, patient/caregiver will:  - will take all medications exactly as prescribed - keep all scheduled MD appointments - report worsening sign/symptoms of dementia with or without change in behavior - call a family member to talk to for support and or respite care - continue follow up with the embedded BSW to assist with placement and or caregiver resources  - call 911 if patient becomes aggressive and or if she is fearful of her or the patient's safety     .  Evaluate and treat abnormal worsening Dementia with behavior changes        Timeframe:  Short-Term Goal Priority:  High Start Date:  10/15/20                           Expected End Date:  10/22/20   Next scheduled follow up date: 10/22/21  Over the next 7 days, patient/caregiver will: - report to their local ED for evaluation and treatment of worsening Dementia with abnormal behavior - follow up with Neurologist as soon as possible to report worsening symptoms related to Dementia - take all medications exactly as prescribed - contact the CCM team and or PCP for further concerns or questions                             .  Fall prevention        Timeframe:  Long-Range Goal Priority:  High Start Date: 10/15/20                            Expected End Date:  01/13/21                     Next follow up date: 11/04/20   Over the next 90 days, patient/caregiver will:  - Utilize his walker (assistive device) appropriately with all ambulation - De-clutter walkways - Change positions slowly - Wear secure fitting shoes at all times with ambulation - Utilize home lighting for dim lit areas - Demonstrate self and pet awareness at all times     .  Manage Urinary Incontinence         Timeframe:  Long-Range Goal Priority:  High Start Date: 10/15/20                            Expected End Date: 11/26/20                      Follow Up Date  11/04/20   - keep skin dry - wear a protective pad or garment  - follow up with [facility] as directed by PCP for evaluation and treatment of elevated PSA - Avoid Alcohol - Caffeine - Carbonated drinks and sparkling water - Artificial sweeteners    Why is this important?  Leaking urine (pee) can cause soreness from skin rashes and redness.   This is caused by skin being exposed to urine (pee).    Notes:        The patient verbalized understanding of instructions, educational materials, and care plan provided today and declined offer to receive copy of patient instructions, educational materials, and care plan.   Telephone follow up appointment with care management team member scheduled for: 10/22/20  Lynne Logan, RN

## 2020-10-16 ENCOUNTER — Ambulatory Visit: Payer: Self-pay

## 2020-10-16 ENCOUNTER — Telehealth: Payer: Medicare HMO

## 2020-10-16 DIAGNOSIS — N1831 Chronic kidney disease, stage 3a: Secondary | ICD-10-CM

## 2020-10-16 DIAGNOSIS — F0391 Unspecified dementia with behavioral disturbance: Secondary | ICD-10-CM

## 2020-10-16 DIAGNOSIS — R3911 Hesitancy of micturition: Secondary | ICD-10-CM

## 2020-10-16 DIAGNOSIS — I10 Essential (primary) hypertension: Secondary | ICD-10-CM

## 2020-10-16 DIAGNOSIS — N183 Chronic kidney disease, stage 3 unspecified: Secondary | ICD-10-CM

## 2020-10-16 DIAGNOSIS — G20 Parkinson's disease: Secondary | ICD-10-CM

## 2020-10-16 DIAGNOSIS — R972 Elevated prostate specific antigen [PSA]: Secondary | ICD-10-CM

## 2020-10-16 NOTE — ED Notes (Signed)
Went over to pull the pt up in bed and pt had pulled out his IV and was holding it in his hand. Took IV from pt hand, placed gauze on IV site and wrapped with 1" coban. Slid pt up in bed

## 2020-10-17 NOTE — Progress Notes (Signed)
Subjective: Dylan [name]. presents today for follow up of at risk foot care. Pt has h/o NIDDM with chronic kidney disease and PAD.  He also has painful mycotic nails b/l that are difficult to trim. Aggravating factors include wearing enclosed shoe gear. Pain is relieved with periodic professional debridement.  Dylan [name] is accompanied by his wife on today's visit.  His wife voices no new pedal problems on today's visit.   No Known Allergies   Objective: There were no vitals filed for this visit.  Dylan [name] is a pleasant 79 y.o. year old African-American male in NAD. AAO x 3.   Vascular Examination:  Capillary fill time to digits delayed b/l lower extremities. Faintly palpable DP pulse(s) b/l lower extremities. Faintly palpable PT pulse(s) b/l lower extremities. Pedal hair absent. Lower extremity skin temperature gradient within normal limits.  Dermatological Examination: Pedal skin with normal turgor, texture and tone bilaterally. No open wounds bilaterally. No interdigital macerations bilaterally. Toenails 1-5 b/l elongated, dystrophic, thickened, crumbly with subungual debris and tenderness to dorsal palpation.   Musculoskeletal: Normal muscle strength 5/5 to all lower extremity muscle groups bilaterally. No pain crepitus or joint limitation noted with ROM b/l. No gross bony deformities bilaterally. Utilizes wheelchair for mobility assistance.  Neurological: Protective sensation intact 5/5 intact bilaterally with 10g monofilament b/l. Vibratory sensation intact b/l. Babinski reflex negative b/l. Clonus negative b/l.  Assessment: 1. Pain due to onychomycosis of toenails of both feet   2. Type II diabetes mellitus with peripheral circulatory disorder (HCC)    Plan: -Continue diabetic foot care principles. -No new findings. No new orders. -Toenails 1-5 b/l were debrided in length and girth with sterile nail nippers and dremel without iatrogenic bleeding.  -Patient to continue  soft, supportive shoe gear daily. -Patient to report any pedal injuries to medical professional immediately. -Patient/POA to call should there be question/concern in the interim.  Return in about 3 months (around 01/10/2021) for diabetic foot care.

## 2020-10-19 NOTE — Patient Instructions (Signed)
Visit Information  Goals Addressed    . Effective Caregiver Coping       Timeframe:  Long-Range Goal Priority:  High Start Date: 10/15/20                            Expected End Date: 01/13/21  Next scheduled follow up  10/22/20                    Over the next 90 days, patient/caregiver will:  - will take all medications exactly as prescribed - keep all scheduled MD appointments - report worsening sign/symptoms of dementia with or without change in behavior - call a family member to talk to for support and or respite care - continue follow up with the embedded BSW to assist with placement and or caregiver resources  - call 911 if patient becomes aggressive and or if she is fearful of her or the patient's safety     . Evaluate and treat abnormal worsening Dementia with behavior changes       Timeframe:  Short-Term Goal Priority:  High Start Date:  10/15/20                           Expected End Date:  10/22/20   Next scheduled follow up date: 10/22/21  Over the next 30 days, patient/caregiver will: - report to the local ED for re-evaluation and treatment of worsening Dementia with abnormal behavior if symptoms become out of control  - follow up with Neurologist as soon as possible to report worsening symptoms related to Dementia - take all medications exactly as prescribed - contact the CCM team and or PCP for further concerns or questions  - call 911 for emergencies     . Fall prevention       Timeframe:  Long-Range Goal Priority:  High Start Date: 10/15/20                            Expected End Date:  01/13/21                     Next follow up date: 10/22/20  Over the next 90 days, patient/caregiver will:  - Utilize his walker (assistive device) appropriately with all ambulation - De-clutter walkways - Change positions slowly - Wear secure fitting shoes at all times with ambulation - Utilize home lighting for dim lit areas - Demonstrate self and pet awareness at all  times      . Manage Urinary Incontinence       Timeframe:  Long-Range Goal Priority:  High Start Date: 10/15/20                            Expected End Date: 11/26/20                      Follow Up Date  10/22/20   - keep skin dry - wear a protective pad or garment  - follow up with [facility] as directed by PCP for evaluation and treatment of elevated PSA - Avoid Alcohol - Caffeine - Carbonated drinks and sparkling water - Artificial sweeteners    Why is this important?    Leaking urine (pee) can cause soreness from skin rashes and redness.   This is caused by  skin being exposed to urine (pee).    Notes:        The patient verbalized understanding of instructions, educational materials, and care plan provided today and declined offer to receive copy of patient instructions, educational materials, and care plan.   Telephone follow up appointment with care management team member scheduled for: 10/22/20  Lynne Logan, RN
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
## 2020-10-21 ENCOUNTER — Ambulatory Visit: Payer: Medicare HMO

## 2020-10-21 ENCOUNTER — Other Ambulatory Visit: Payer: Self-pay

## 2020-10-21 VITALS — BP 140/70 | HR 71 | Temp 97.8°F | Ht 67.0 in | Wt 171.8 lb

## 2020-10-21 DIAGNOSIS — Z23 Encounter for immunization: Secondary | ICD-10-CM | POA: Diagnosis not present

## 2020-10-21 DIAGNOSIS — Z0000 Encounter for general adult medical examination without abnormal findings: Secondary | ICD-10-CM

## 2020-10-21 NOTE — Patient Instructions (Signed)
Dylan [name] , Thank you for taking time to come for your Medicare Wellness Visit. I appreciate your ongoing commitment to your health goals. Please review the following plan we discussed and let me know if I can assist you in the future.   Screening recommendations/referrals: Colonoscopy: not required Recommended yearly ophthalmology/optometry visit for glaucoma screening and checkup Recommended yearly dental visit for hygiene and checkup  Vaccinations: Influenza vaccine: completed 07/29/2020 Pneumococcal vaccine: today Tdap vaccine: completed 11/25/2014 Shingles vaccine: discussed   Covid-19:  09/07/2020, 12/30/2019, 12/09/2019  Advanced directives: copy in chart  Conditions/risks identified: none  Next appointment: Follow up in one year for your annual wellness visit.   Preventive Care 46 Years and Older, Male Preventive care refers to lifestyle choices and visits with your health care provider that can promote health and wellness. What does preventive care include?  A yearly physical exam. This is also called an annual well check.  Dental exams once or twice a year.  Routine eye exams. Ask your health care provider how often you should have your eyes checked.  Personal lifestyle choices, including:  Daily care of your teeth and gums.  Regular physical activity.  Eating a healthy diet.  Avoiding tobacco and drug use.  Limiting alcohol use.  Practicing safe sex.  Taking low doses of aspirin every day.  Taking vitamin and mineral supplements as recommended by your health care provider. What happens during an annual well check? The services and screenings done by your health care provider during your annual well check will depend on your age, overall health, lifestyle risk factors, and family history of disease. Counseling  Your health care provider may ask you questions about your:  Alcohol use.  Tobacco use.  Drug use.  Emotional well-being.  Home and  relationship well-being.  Sexual activity.  Eating habits.  History of falls.  Memory and ability to understand (cognition).  Work and work Statistician. Screening  You may have the following tests or measurements:  Height, weight, and BMI.  Blood pressure.  Lipid and cholesterol levels. These may be checked every 5 years, or more frequently if you are over 56 years old.  Skin check.  Lung cancer screening. You may have this screening every year starting at age 5 if you have a 30-pack-year history of smoking and currently smoke or have quit within the past 15 years.  Fecal occult blood test (FOBT) of the stool. You may have this test every year starting at age 65.  Flexible sigmoidoscopy or colonoscopy. You may have a sigmoidoscopy every 5 years or a colonoscopy every 10 years starting at age 78.  Prostate cancer screening. Recommendations will vary depending on your family history and other risks.  Hepatitis C blood test.  Hepatitis B blood test.  Sexually transmitted disease (STD) testing.  Diabetes screening. This is done by checking your blood sugar (glucose) after you have not eaten for a while (fasting). You may have this done every 1-3 years.  Abdominal aortic aneurysm (AAA) screening. You may need this if you are a current or former smoker.  Osteoporosis. You may be screened starting at age 33 if you are at high risk. Talk with your health care provider about your test results, treatment options, and if necessary, the need for more tests. Vaccines  Your health care provider may recommend certain vaccines, such as:  Influenza vaccine. This is recommended every year.  Tetanus, diphtheria, and acellular pertussis (Tdap, Td) vaccine. You may need a Td booster every  10 years.  Zoster vaccine. You may need this after age 50.  Pneumococcal 13-valent conjugate (PCV13) vaccine. One dose is recommended after age 75.  Pneumococcal polysaccharide (PPSV23) vaccine. One  dose is recommended after age 72. Talk to your health care provider about which screenings and vaccines you need and how often you need them. This information is not intended to replace advice given to you by your health care provider. Make sure you discuss any questions you have with your health care provider. Document Released: 11/13/2015 Document Revised: 07/06/2016 Document Reviewed: 08/18/2015 Elsevier Interactive Patient Education  2017 [place] Prevention in the Home Falls can cause injuries. They can happen to people of all ages. There are many things you can do to make your home safe and to help prevent falls. What can I do on the outside of my home?  Regularly fix the edges of walkways and driveways and fix any cracks.  Remove anything that might make you trip as you walk through a door, such as a raised step or threshold.  Trim any bushes or trees on the path to your home.  Use bright outdoor lighting.  Clear any walking paths of anything that might make someone trip, such as rocks or tools.  Regularly check to see if handrails are loose or broken. Make sure that both sides of any steps have handrails.  Any raised decks and porches should have guardrails on the edges.  Have any leaves, snow, or ice cleared regularly.  Use sand or salt on walking paths during winter.  Clean up any spills in your garage right away. This includes oil or grease spills. What can I do in the bathroom?  Use night lights.  Install grab bars by the toilet and in the tub and shower. Do not use towel bars as grab bars.  Use non-skid mats or decals in the tub or shower.  If you need to sit down in the shower, use a plastic, non-slip stool.  Keep the floor dry. Clean up any water that spills on the floor as soon as it happens.  Remove soap buildup in the tub or shower regularly.  Attach bath mats securely with double-sided non-slip rug tape.  Do not have throw rugs and other  things on the floor that can make you trip. What can I do in the bedroom?  Use night lights.  Make sure that you have a light by your bed that is easy to reach.  Do not use any sheets or blankets that are too big for your bed. They should not hang down onto the floor.  Have a firm chair that has side arms. You can use this for support while you get dressed.  Do not have throw rugs and other things on the floor that can make you trip. What can I do in the kitchen?  Clean up any spills right away.  Avoid walking on wet floors.  Keep items that you use a lot in easy-to-reach places.  If you need to reach something above you, use a strong step stool that has a grab bar.  Keep electrical cords out of the way.  Do not use floor polish or wax that makes floors slippery. If you must use wax, use non-skid floor wax.  Do not have throw rugs and other things on the floor that can make you trip. What can I do with my stairs?  Do not leave any items on the stairs.  Make sure  that there are handrails on both sides of the stairs and use them. Fix handrails that are broken or loose. Make sure that handrails are as long as the stairways.  Check any carpeting to make sure that it is firmly attached to the stairs. Fix any carpet that is loose or worn.  Avoid having throw rugs at the top or bottom of the stairs. If you do have throw rugs, attach them to the floor with carpet tape.  Make sure that you have a light switch at the top of the stairs and the bottom of the stairs. If you do not have them, ask someone to add them for you. What else can I do to help prevent falls?  Wear shoes that:  Do not have high heels.  Have rubber bottoms.  Are comfortable and fit you well.  Are closed at the toe. Do not wear sandals.  If you use a stepladder:  Make sure that it is fully opened. Do not climb a closed stepladder.  Make sure that both sides of the stepladder are locked into place.  Ask  someone to hold it for you, if possible.  Clearly mark and make sure that you can see:  Any grab bars or handrails.  First and last steps.  Where the edge of each step is.  Use tools that help you move around (mobility aids) if they are needed. These include:  Canes.  Walkers.  Scooters.  Crutches.  Turn on the lights when you go into a dark area. Replace any light bulbs as soon as they burn out.  Set up your furniture so you have a clear path. Avoid moving your furniture around.  If any of your floors are uneven, fix them.  If there are any pets around you, be aware of where they are.  Review your medicines with your doctor. Some medicines can make you feel dizzy. This can increase your chance of falling. Ask your doctor what other things that you can do to help prevent falls. This information is not intended to replace advice given to you by your health care provider. Make sure you discuss any questions you have with your health care provider. Document Released: 08/13/2009 Document Revised: 03/24/2016 Document Reviewed: 11/21/2014 Elsevier Interactive Patient Education  2017 [employer].

## 2020-10-21 NOTE — Progress Notes (Signed)
This visit occurred during the SARS-CoV-2 public health emergency.  Safety protocols were in place, including screening questions prior to the visit, additional usage of staff PPE, and extensive cleaning of exam room while observing appropriate contact time as indicated for disinfecting solutions.  Subjective:   Dalyn Xue Jr. is a 79 y.o. male who presents for Medicare Annual/Subsequent preventive examination.  Review of Systems     Cardiac Risk Factors include: advanced age (>55men, >65 women);diabetes mellitus;dyslipidemia;hypertension;male gender     Objective:    Today's Vitals   10/21/20 1600  BP: 140/70  Pulse: 71  Temp: 97.8 F (36.6 C)  TempSrc: Oral  SpO2: 98%  Weight: 171 lb 12.8 oz (77.9 kg)  Height: 5' 7" (1.702 m)   Body mass index is 26.91 kg/m.  Advanced Directives 10/21/2020 04/27/2020 04/26/2020 10/17/2019 12/12/2018 10/04/2018 06/14/2018  Does Patient Have a Medical Advance Directive? Yes Yes;No Unable to assess, patient is non-responsive or altered mental status Yes Yes No -  Type of Advance Directive Out of facility DNR (pink MOST or yellow form) - - Healthcare Power of Attorney Healthcare Power of Attorney;Living will - -  Does patient want to make changes to medical advance directive? - No - Patient declined - - No - Patient declined Yes (MAU/Ambulatory/Procedural Areas - Information given) No - Patient declined  Copy of Healthcare Power of Attorney in Chart? - - - Yes - validated most recent copy scanned in chart (See row information) No - copy requested - -  Would patient like information on creating a medical advance directive? - No - Patient declined - - - - No - Patient declined    Current Medications (verified) Outpatient Encounter Medications as of 10/21/2020  Medication Sig  . Ascorbic Acid (VITAMIN C) 1000 MG tablet Take 1,000 mg by mouth every morning.   . carbidopa-levodopa (SINEMET IR) 25-100 MG tablet Take 2 tablets by mouth 3 (three) times  daily.  . carvedilol (COREG) 12.5 MG tablet TAKE 1 TABLET BY MOUTH 2 TIMES DAILY. (Patient taking differently: Take 12.5 mg by mouth 2 (two) times daily with a meal.)  . Cholecalciferol (VITAMIN D3) 5000 units CAPS Take 5,000 Units by mouth daily with supper.   . 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 Lancet Devices (ONE TOUCH DELICA LANCING DEV) MISC Please fill ONE TOUCH DELICA LANCING DEVICE.  Use to test blood sugar twice daily as directed. E11.65 (Patient taking differently: Please fill ONE TOUCH DELICA LANCING DEVICE.  Use to test blood sugar twice daily as directed. E11.65)  . Lancets MISC Please fill basic/generic push button lancets.  Patient to test blood sugar twice daily. DX: E11.65  . Melatonin 10 MG TABS Take 10 mg by mouth at bedtime.   . memantine (NAMENDA) 10 MG tablet TAKE 1 TABLET BY MOUTH TWICE A DAY (Patient taking differently: Take 10 mg by mouth 2 (two) times daily.)  . metFORMIN (GLUCOPHAGE) 1000 MG tablet TAKE 1 TABLET BY MOUTH EVERY DAY (Patient taking differently: Take 1,000 mg by mouth 2 (two) times daily with a meal.)  . pravastatin (PRAVACHOL) 20 MG tablet TAKE 1 TABLET BY ORAL ROUTE EVERY DAY (Patient taking differently: Take 20 mg by mouth daily.)  . rivastigmine (EXELON) 9.5 mg/24hr Place 1 patch (9.5 mg total) onto the skin daily.  . TRULICITY 1.5 MG/0.5ML SOPN INJECT 1.5 MG INTO THE SKIN ONCE A WEEK. (Patient taking differently: Inject 1.5 mg into the skin   every Monday.)  . COLCRYS 0.6 MG tablet Take 1 tablet (0.6 mg total) by mouth daily. (Patient not taking: Reported on 10/21/2020)  . desonide (DESOWEN) 0.05 % lotion Apply topically 2 times per day prn (Patient not taking: No sig reported)  . diclofenac Sodium (VOLTAREN) 1 % GEL Apply 2 g topically 3 (three) times daily. (Patient not taking: Reported on 10/21/2020)  . QUEtiapine (SEROQUEL) 25 MG tablet TAKE 1 TABLET (25 MG TOTAL) BY MOUTH AT BEDTIME AS  NEEDED. (Patient not taking: No sig reported)   No facility-administered encounter medications on file as of 10/21/2020.    Allergies (verified) Patient has no known allergies.   History: Past Medical History:  Diagnosis Date  . Anxiety   . Aortic stenosis    mild AS 11/24/16 (peak grad 24, mean grad 11) Dr. [name]  . CHB (complete heart block) ([place]) 07/2017  . 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 Diabetes mellitus without complication ([place])   . Encounter for care of pacemaker 07/11/2020  . Gait abnormality   . Gout   . Hyperlipemia   . Hypertension   . 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 Memory loss   . Pacemaker Dual chamber [place] MRI  model [id number] 07/11/2020 06/08/2020  . Peripheral arterial disease ([place])   . 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 Second degree AV block    Wenckebach; no indication for pacemaker as of 12/01/16 (Dr. [name])  . Vitamin B12 deficiency anemia 03/01/2018  . Vitamin D deficiency disease    Past Surgical History:  Procedure Laterality Date  . CARDIOVASCULAR STRESS TEST     11/21/16 Low risk study, EF 52% [facility])  . EYE SURGERY    . KYPHOPLASTY N/A 02/15/2017   Procedure: LUMBAR FOUR KYPHOPLASTY;  Surgeon: Phylliss Bob, MD;  Location: [facility];  Service: Orthopedics;  Laterality: N/A;  . lipoma surgery     neck - 30 years ago  . PACEMAKER IMPLANT N/A 07/11/2017   Procedure: Pacemaker Implant;  Surgeon: Constance Haw, MD;  Location: [place] [facility];  Service: Cardiovascular;  Laterality: N/A;  . TRANSTHORACIC ECHOCARDIOGRAM     11/24/16 [facility]): EF 55-60%, mild AS, mild-mod MR, mod TR, moderate pulm HTN, PAP 49 mmHg   Family History  Problem Relation Age of Onset  . Diabetes Mother   . Breast cancer Mother   . Arthritis Mother   . Hypertension Father   . Diabetes Sister   . Diabetes Brother   . Diabetes Maternal  Grandmother   . Diabetes Brother   . Diabetes Brother   . Diabetes Brother   . Diabetes Sister   . Diabetes Sister   . Diabetes Sister    Social History   Socioeconomic History  . Marital status: Married    Spouse name: Not on file  . Number of children: 3  . Years of education: 34  . Highest education level: High school graduate  Occupational History  . Occupation: retired  Tobacco Use  . Smoking status: Former Smoker    Packs/day: 0.50    Years: 7.00    Pack years: 3.50    Types: Cigarettes  . Smokeless tobacco: Never Used  . Tobacco comment: quit 35 years  Vaping Use  . Vaping Use: Never used  Substance and Sexual Activity  . Alcohol use: No  . Drug use: No  . Sexual activity: Not Currently  Other Topics Concern  . Not on file  Social History Narrative   Lives at home with his fiancee.   Right-handed.   No daily use of caffeine.      Social Determinants of Health   Financial Resource Strain: Low Risk   . Difficulty of Paying Living Expenses: Not hard at all  Food Insecurity: No Food Insecurity  . Worried About Running Out of Food in the Last Year: Never true  . Ran Out of Food in the Last Year: Never true  Transportation Needs: No Transportation Needs  . Lack of Transportation (Medical): No  . Lack of Transportation (Non-Medical): No  Physical Activity: Sufficiently Active  . Days of Exercise per Week: 3 days  . Minutes of Exercise per Session: 60 min  Stress: No Stress Concern Present  . Feeling of Stress : Not at all  Social Connections: Not on file    Tobacco Counseling Counseling given: Not Answered Comment: quit 35 years   Clinical Intake:  Pre-visit preparation completed: Yes  Pain : No/denies pain     Nutritional Status: BMI 25 -29 Overweight Nutritional Risks: None  How often do you need to have someone help you when you read instructions, pamphlets, or other written materials from your doctor or pharmacy?: 4 - Often  Diabetic?  Yes Nutrition Risk Assessment:  Has the patient had any N/V/D within the last 2 months?  No  Does the patient have any non-healing wounds?  No  Has the patient had any unintentional weight loss or weight gain?  No   Diabetes:  Is the patient diabetic?  Yes  If diabetic, was a CBG obtained today?  No  Did the patient bring in their glucometer from home?  No  How often do you monitor your CBG's? Not regularly.   Financial Strains and Diabetes Management:  Are you having any financial strains with the device, your supplies or your medication? No .  Does the patient want to be seen by Chronic Care Management for management of their diabetes?  No  Would the patient like to be referred to a Nutritionist or for Diabetic Management?  No   Diabetic Exams:  Diabetic Eye Exam: Completed 11/28/2019 Diabetic Foot Exam: Overdue, Pt has been advised about the importance in completing this exam. Pt is scheduled for diabetic foot exam on next appointment.   Interpreter Needed?: No  Information entered by :: NAllen LPN   Activities of Daily Living In your present state of health, do you have any difficulty performing the following activities: 10/21/2020 04/27/2020  Hearing? N N  Vision? Y N  Comment runs in to the wall sometimes if not wearing glasses -  Difficulty concentrating or making decisions? Y N  Walking or climbing stairs? Y Y  Comment needs assistance with gait belt -  Dressing or bathing? Y N  Doing errands, shopping? Y Y  Preparing Food and eating ? Y -  Using the Toilet? Y -  In the past six months, have you accidently leaked urine? N -  Do you have problems with loss of bowel control? N -  Managing your Medications? Y -  Managing your Finances? Y -  Housekeeping or managing your Housekeeping? Y -  Some recent data might be hidden    Patient Care Team: Sanders, Robyn, MD as PCP - General (Internal Medicine) Ganji, Jay, MD as PCP - Cardiology (Cardiology) Camnitz, Will  Martin, MD as Consulting Physician (Cardiology) Little, Angel L, RN as Case Manager   Humble, Kendra as Social Worker  Indicate any recent Medical Services you may have received from other than [facility] providers in the past year (date may be approximate).     Assessment:   This is a routine wellness examination for Kali.  Hearing/Vision screen  Hearing Screening   125Hz 250Hz 500Hz 1000Hz 2000Hz 3000Hz 4000Hz 6000Hz 8000Hz  Right ear:           Left ear:           Vision Screening Comments: Regular eye exams, Dr. Oman  Dietary issues and exercise activities discussed: Current Exercise Habits: Home exercise routine, Type of exercise: walking;stretching, Time (Minutes): 60, Frequency (Times/Week): 3, Weekly Exercise (Minutes/Week): 180  Goals    . "    . "to maintain his A1c below 7"     Spouse stated Current Barriers:  . Knowledge Deficits related to disease process and Self Health management of Diabetes  . Chronic Disease Management support and education needs related to CKDIII, DMII, HTN, memory loss  Nurse Case Manager Clinical Goal(s):  . 04/03/20 New  Over the next 90 days, spouse will report patient continues to modify his diet to recommended ADA diet and is taking his diabetic medications exactly as prescribed in order to better manage his DM and achieve lowering his A1c <7.0 % Goal Met . New 09/10/20 Over the next 90 days, patient will continue to work with the CCM team and PCP for disease education and support to improve Self Health management of Diabetes   CCM RN CM Interventions:  11/11//21 call completed with spouse Gloria . Evaluation of current treatment plan related to diabetes and patient's adherence to plan as established by provider . Reviewed and discussed patient's current A1c is down to 6.4%; Positive reinforcement provided to spouse for assisting patient with lifestyle changes to help lower his A1c and improve his overall health  . Reviewed medications with  spouse and discussed patient is adhering to taking his weekly injection of Trulicity and patient is self injecting; with spouse Gloria supervising dosage and administration . Re-educated spouse Gloria, providing education and rationale, to check cbg daily before meals and record, calling the CCM team and or PCP for findings outside established parameters; FBS 80-130 and <180 after meals . Discussed plans with patient for ongoing care management follow up and provided patient with direct contact information for care management team  Patient Self Care Activities:  . Attends all scheduled provider appointments . Currently UNABLE TO independently perform self-care without assistance  . Supportive caregiver, spouse Gloria to assist with care needs  Please see past updates related to this goal by clicking on the "Past Updates" button in the selected goal      . Effective Caregiver Coping     Timeframe:  Long-Range Goal Priority:  High Start Date: 10/15/20                            Expected End Date: 01/13/21  Next scheduled follow up  10/22/20                    Over the next 90 days, patient/caregiver will:  - will take all medications exactly as prescribed - keep all scheduled MD appointments - report worsening sign/symptoms of dementia with or without change in behavior - call a family member to talk to for support and or respite care - continue follow up with the embedded BSW to assist   with placement and or caregiver resources  - call 911 if patient becomes aggressive and or if she is fearful of her or the patient's safety     . Evaluate and treat abnormal worsening Dementia with behavior changes     Timeframe:  Short-Term Goal Priority:  High Start Date:  10/15/20                           Expected End Date:  10/22/20   Next scheduled follow up date: 10/22/21  Over the next 30 days, patient/caregiver will: - report to the local ED for re-evaluation and treatment of worsening  Dementia with abnormal behavior if symptoms become out of control  - follow up with Neurologist as soon as possible to report worsening symptoms related to Dementia - take all medications exactly as prescribed - contact the CCM team and or PCP for further concerns or questions  - call 911 for emergencies                             . Fall prevention     Timeframe:  Long-Range Goal Priority:  High Start Date: 10/15/20                            Expected End Date:  01/13/21                     Next follow up date: 10/22/20  Over the next 90 days, patient/caregiver will:  - Utilize his walker (assistive device) appropriately with all ambulation - De-clutter walkways - Change positions slowly - Wear secure fitting shoes at all times with ambulation - Utilize home lighting for dim lit areas - Demonstrate self and pet awareness at all times      . Manage Urinary Incontinence     Timeframe:  Long-Range Goal Priority:  High Start Date: 10/15/20                            Expected End Date: 11/26/20                      Follow Up Date  10/22/20   - keep skin dry - wear a protective pad or garment  - follow up with [facility] as directed by PCP for evaluation and treatment of elevated PSA - Avoid Alcohol - Caffeine - Carbonated drinks and sparkling water - Artificial sweeteners    Why is this important?    Leaking urine (pee) can cause soreness from skin rashes and redness.   This is caused by skin being exposed to urine (pee).    Notes:     . Patient Stated     10/17/2019, wants to move better    . Patient Stated     10/21/2020, wife would like him to walk without the walker    . Work with SW to manage care coordination needs     Timeframe:  Short-Term Goal Priority:  High Start Date:   12.7.21                        Expected End Date:   3.7.22                    Next expected date   of contact: 10/27/20   Patient Goals/Self-Care Activities Over the next  30 days, patient will: With the help of Gloria Gilliam  - Patient will self administer medications as prescribed -Patient will attend all scheduled provider appointments -Patient will call provider office for new concerns or questions -Patient will work with BSW to address care coordination needs and will continue to work with the clinical team to address health care and disease management related needs.   -Contact DSS to learn more about becoming a paid caregiver      Depression Screen PHQ 2/9 Scores 10/21/2020 10/17/2019 09/19/2019 07/24/2019 04/23/2019 03/21/2019 01/31/2019  PHQ - 2 Score 0 0 0 0 0 0 0  PHQ- 9 Score - 3 - - - - -    Fall Risk Fall Risk  10/21/2020 10/17/2019 10/10/2019 09/19/2019 07/24/2019  Falls in the past year? 1 1 0 1 1  Comment tries to get up without assistance, During transfer, Falls in between bed trying to get out lost balance - - -  Number falls in past yr: 1 0 - 1 0  Injury with Fall? - 0 - 0 0  Risk for fall due to : - Impaired balance/gait;Medication side effect;Impaired mobility;History of fall(s) - - -  Follow up - Falls evaluation completed;Education provided;Falls prevention discussed - - -    FALL RISK PREVENTION PERTAINING TO THE HOME:  Any stairs in or around the home? Yes  If so, are there any without handrails? Yes  Home free of loose throw rugs in walkways, pet beds, electrical cords, etc? Yes  Adequate lighting in your home to reduce risk of falls? Yes   ASSISTIVE DEVICES UTILIZED TO PREVENT FALLS:  Life alert? No  Use of a cane, walker or w/c? Yes  Grab bars in the bathroom? Yes  Shower chair or bench in shower? Yes  Elevated toilet seat or a handicapped toilet? Yes   TIMED UP AND GO:  Was the test performed? No .   Gait slow and steady with assistive device  Cognitive Function: MMSE - Mini Mental State Exam 03/24/2020 01/29/2020 09/24/2019 05/22/2019  Not completed: - - (No Data) -  Orientation to time 1 3 3 1  Orientation to  Place 2 2 0 3  Registration 3 3 3 3  Attention/ Calculation 1 0 2 0  Recall 2 1 2 1  Language- name 2 objects 2 2 2 2  Language- repeat 0 1 0 0  Language- follow 3 step command 3 3 3 3  Language- read & follow direction 1 1 1 1  Write a sentence 0 0 0 0  Copy design 0 0 0 1  Copy design-comments 5 animals - - -  Total score 15 16 16 15     6CIT Screen 01/31/2019 10/04/2018  What Year? 4 points 0 points  What month? 0 points 0 points  What time? 0 points 0 points  Count back from 20 0 points 0 points  Months in reverse 4 points 4 points  Repeat phrase 0 points 2 points  Total Score 8 6    Immunizations Immunization History  Administered Date(s) Administered  . Fluad Quad(high Dose 65+) 07/29/2020  . Influenza, High Dose Seasonal PF 09/24/2018, 07/24/2019  . Influenza-Unspecified 07/17/2016  . PFIZER SARS-COV-2 Vaccination 12/09/2019, 12/30/2019, 09/07/2020  . Pneumococcal Conjugate-13 09/20/2016  . Pneumococcal Polysaccharide-23 10/21/2020    TDAP status: Up to date  Flu Vaccine status: Up to date  Pneumococcal vaccine status: Completed during today's visit.    Covid-19 vaccine status: Completed vaccines  Qualifies for Shingles Vaccine? Yes   Zostavax completed No   Shingrix Completed?: No.    Education has been provided regarding the importance of this vaccine. Patient has been advised to call insurance company to determine out of pocket expense if they have not yet received this vaccine. Advised may also receive vaccine at local pharmacy or Health Dept. Verbalized acceptance and understanding.  Screening Tests Health Maintenance  Topic Date Due  . FOOT EXAM  10/16/2020  . OPHTHALMOLOGY EXAM  11/27/2020  . HEMOGLOBIN A1C  03/07/2021  . URINE MICROALBUMIN  08/19/2021  . TETANUS/TDAP  11/25/2024  . INFLUENZA VACCINE  Completed  . COVID-19 Vaccine  Completed  . Hepatitis C Screening  Completed  . PNA vac Low Risk Adult  Completed    Health Maintenance  Health  Maintenance Due  Topic Date Due  . FOOT EXAM  10/16/2020    Colorectal cancer screening: No longer required.   Lung Cancer Screening: (Low Dose CT Chest recommended if Age 2-80 years, 30 pack-year currently smoking OR have quit w/in 15years.) does not qualify.   Lung Cancer Screening Referral: no  Additional Screening:  Hepatitis C Screening: does qualify; Completed 07/29/2020  Vision Screening: Recommended annual ophthalmology exams for early detection of glaucoma and other disorders of the eye. Is the patient up to date with their annual eye exam?  Yes  Who is the provider or what is the name of the office in which the patient attends annual eye exams? Dr. [place] If pt is not established with a provider, would they like to be referred to a provider to establish care? No .   Dental Screening: Recommended annual dental exams for proper oral hygiene  Community Resource Referral / Chronic Care Management: CRR required this visit?  No   CCM required this visit?  No      Plan:     I have personally reviewed and noted the following in the patient's chart:   . Medical and social history . Use of alcohol, tobacco or illicit drugs  . Current medications and supplements . Functional ability and status . Nutritional status . Physical activity . Advanced directives . List of other physicians . Hospitalizations, surgeries, and ER visits in previous 12 months . Vitals . Screenings to include cognitive, depression, and falls . Referrals and appointments  In addition, I have reviewed and discussed with patient certain preventive protocols, quality metrics, and best practice recommendations. A written personalized care plan for preventive services as well as general preventive health recommendations were provided to patient.     Kellie Simmering, LPN   12/45/8099   Nurse Notes: 6 CIT not administered. Patient has diagnosis of dementia and is followed by neurology.

## 2020-10-22 ENCOUNTER — Telehealth: Payer: Medicare HMO

## 2020-10-22 ENCOUNTER — Ambulatory Visit: Payer: Self-pay

## 2020-10-22 ENCOUNTER — Telehealth: Payer: Self-pay

## 2020-10-22 DIAGNOSIS — G20C Parkinsonism, unspecified: Secondary | ICD-10-CM

## 2020-10-22 DIAGNOSIS — N183 Chronic kidney disease, stage 3 unspecified: Secondary | ICD-10-CM

## 2020-10-22 DIAGNOSIS — N1831 Chronic kidney disease, stage 3a: Secondary | ICD-10-CM

## 2020-10-22 DIAGNOSIS — F0391 Unspecified dementia with behavioral disturbance: Secondary | ICD-10-CM

## 2020-10-22 DIAGNOSIS — G20 Parkinson's disease: Secondary | ICD-10-CM

## 2020-10-22 DIAGNOSIS — E1122 Type 2 diabetes mellitus with diabetic chronic kidney disease: Secondary | ICD-10-CM

## 2020-10-22 NOTE — Patient Instructions (Signed)
Visit Information  Goals Addressed    . Effective Caregiver Coping       Timeframe:  Long-Range Goal Priority:  High Start Date: 10/15/20                            Expected End Date: 01/13/21  Next scheduled follow up  11/04/20                   Over the next 90 days, patient/caregiver will:  - will take all medications exactly as prescribed - keep all scheduled MD appointments - report worsening sign/symptoms of dementia with or without change in behavior - call a family member to talk to for support and or respite care - continue follow up with the embedded BSW to assist with placement and or caregiver resources  - call 911 if patient becomes aggressive and or if she is fearful of her or the patient's safety     . Evaluate and treat abnormal worsening Dementia with behavior changes       Timeframe:  Short-Term Goal Priority:  High Start Date:  10/15/20                           Expected End Date:  10/22/20   Next scheduled follow up date: 11/04/20  Over the next 30 days, patient/caregiver will: - report to the local ED for re-evaluation and treatment of worsening Dementia with abnormal behavior if symptoms become out of control  - follow up with Neurologist as soon as possible to report worsening symptoms related to Dementia - take all medications exactly as prescribed - contact the CCM team and or PCP for further concerns or questions  - call 911 for emergencies      . Fall prevention       Timeframe:  Long-Range Goal Priority:  High Start Date: 10/15/20                            Expected End Date:  01/13/21                     Next follow up date: 11/04/20  Over the next 90 days, patient/caregiver will:  - Utilize his walker (assistive device) appropriately with all ambulation - De-clutter walkways - Change positions slowly - Wear secure fitting shoes at all times with ambulation - Utilize home lighting for dim lit areas - Demonstrate self and pet awareness at all  times       The patient verbalized understanding of instructions, educational materials, and care plan provided today and declined offer to receive copy of patient instructions, educational materials, and care plan.   Telephone follow up appointment with care management team member scheduled for: 11/04/20  Lynne Logan, RN

## 2020-10-22 NOTE — Telephone Encounter (Cosign Needed)
  Chronic Care Management   Outreach Note  10/22/2020 Name: Dylan [name]. MRN: [id number] DOB: [birth year]  Referred by: [name], MD Reason for referral : Chronic Care Management (RN CM FU Call )   [name] is enrolled in a Managed Medicaid Health Plan: No  An unsuccessful telephone outreach was attempted today. The patient was referred to the case management team for assistance with care management and care coordination.   Follow Up Plan: Telephone follow up appointment with care management team member scheduled for: 11/04/20  [name], RN, BSN, CCM Care Management Coordinator [place] [facility]  Direct Phone: [phone]
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
## 2020-10-26 ENCOUNTER — Ambulatory Visit: Payer: Medicare HMO | Admitting: Neurology

## 2020-10-26 ENCOUNTER — Encounter: Payer: Self-pay | Admitting: Neurology

## 2020-10-26 VITALS — BP 133/63 | HR 60 | Ht 66.0 in | Wt 170.0 lb

## 2020-10-26 DIAGNOSIS — F0391 Unspecified dementia with behavioral disturbance: Secondary | ICD-10-CM | POA: Diagnosis not present

## 2020-10-26 DIAGNOSIS — G20 Parkinson's disease: Secondary | ICD-10-CM

## 2020-10-26 MED ORDER — RIVASTIGMINE 4.6 MG/24HR TD PT24
4.6000 mg | MEDICATED_PATCH | Freq: Every day | TRANSDERMAL | 12 refills | Status: AC
Start: 2020-10-26 — End: ?

## 2020-10-26 MED ORDER — NUPLAZID 10 MG PO TABS: 10.0000 mg | ORAL_TABLET | Freq: Every day | ORAL | 3 refills | Status: DC
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
## 2020-10-26 NOTE — Patient Instructions (Signed)
Add on Nuplazid 10 mg daily, will take 1-2 weeks to get approved  Once approved cut back Seroquel 1/2 tablet for 3 days then stop, once stopped Seroquel start Nuplazid  Decrease dose of Exelon patch to 4.5 mg daily Keep Sinemet Follow-up in 6-8 weeks with Dr.Yan   Pimavanserin oral tablet or capsule What is this medicine? PIMAVANSERIN (Pi ma VAN ser in) is used to treat hallucinations (hearing voices, seeing things that are not there) and delusions (having beliefs that are not true) associated with Parkinson's disease. This medicine may be used for other purposes; ask your health care provider or pharmacist if you have questions. COMMON BRAND NAME(S): NUPLAZID What should I tell my health care provider before I take this medicine? They need to know if you have any of these conditions:  dementia  heart disease  history of irregular heartbeat  kidney disease  low levels of magnesium or potassium in the blood  an unusual or allergic reaction to pimavanserin, other medicines, foods, dyes, or preservatives  pregnant or trying to get pregnant  breast-feeding How should I use this medicine? Take this medicine by mouth with a glass of water. Follow the directions on the prescription label. You can take it with or without food. If it upsets your stomach, take it with food. Take your medicine at regular intervals. Do not take it more often than directed. Do not stop taking except on your doctor's advice. Talk to your pediatrician regarding the use of this medicine in children. This medicine is not approved for use in children. Overdosage: If you think you have taken too much of this medicine contact a poison control center or emergency room at once. NOTE: This medicine is only for you. Do not share this medicine with others. What if I miss a dose? If you miss a dose, take it as soon as you can. If it is almost time for your next dose, take only that dose. Do not take double or extra  doses. What may interact with this medicine? Do not take this medicine with any of the following medications:  certain medicines for fungal infections like fluconazole, itraconazole, ketoconazole, posaconazole, voriconazole  cisapride  dronedarone  pimozide  thioridazine This medicine may also interact with the following medications:  carbamazepine  certain antivirals for HIV or hepatitis like ritonavir  certain antibiotics like clarithromycin, gatifloxacin, moxifloxacin  certain medicines for irregular heart beat like amiodarone, disopyramide, propafenone, quinidine, sotalol  fosphenytoin  modafinil  nafcillin  phenobarbital  primidone  phenytoin  other medicines for psychotic disturbances  other medicines that prolong the QT interval (cause an abnormal heart rhythm) like dofetilide  rifampin  St. John's Wort This list may not describe all possible interactions. Give your health care provider a list of all the medicines, herbs, non-prescription drugs, or dietary supplements you use. Also tell them if you smoke, drink alcohol, or use illegal drugs. Some items may interact with your medicine. What should I watch for while using this medicine? Visit your doctor or health care professional for regular checks on your progress. Tell your doctor or healthcare professional if your symptoms do not start to get better or if they get worse. What side effects may I notice from receiving this medicine? Side effects that you should report to your doctor or health care professional as soon as possible:  allergic reactions like skin rash, itching or hives, swelling of the face, lips, or tongue  breathing problems  confusion  falls  increase in  hallucination, loss of contact with reality  loss of balance or coordination  signs and symptoms of a dangerous change in heartbeat or heart rhythm like chest pain; dizziness; fast or irregular heartbeat; palpitations; feeling faint  or lightheaded, falls; breathing problems  swelling of the ankles, feet, hands Side effects that usually do not require medical attention (report to your doctor or health care professional if they continue or are bothersome):  constipation  drowsiness  nausea  tiredness This list may not describe all possible side effects. Call your doctor for medical advice about side effects. You may report side effects to FDA at 1-800-FDA-1088. Where should I keep my medicine? Keep out of the reach of children. Store at room temperature between 15 and 30 degrees C (59 and 86 degrees F). Throw away any unused medicine after the expiration date. NOTE: This sheet is a summary. It may not cover all possible information. If you have questions about this medicine, talk to your doctor, pharmacist, or health care provider.  2020 Elsevier/Gold Standard (2018-10-09 07:50:30)

## 2020-10-27 ENCOUNTER — Telehealth: Payer: Medicare HMO

## 2020-10-27 ENCOUNTER — Telehealth: Payer: Self-pay

## 2020-10-27 NOTE — Telephone Encounter (Signed)
°  Chronic Care Management   Outreach Note  10/27/2020 Name: Dylan [name]. MRN: [id number] DOB: 28-Sep-1941  Referred by: [name], MD Reason for referral : [place]. is enrolled in a Managed Medicaid Health Plan: No  An unsuccessful telephone outreach was attempted today. The patient was referred to the case management team for assistance with care management and care coordination.   Follow Up Plan: The care management team will reach out to the patient again over the next 28 days.   Daneen Schick, BSW, CDP Social Worker, Certified Dementia Practitioner [place] [facility] [phone]

## 2020-10-30 ENCOUNTER — Other Ambulatory Visit: Payer: Self-pay | Admitting: Internal Medicine

## 2020-11-03 ENCOUNTER — Ambulatory Visit (INDEPENDENT_AMBULATORY_CARE_PROVIDER_SITE_OTHER): Payer: Medicare HMO | Admitting: Internal Medicine

## 2020-11-03 ENCOUNTER — Telehealth: Payer: Self-pay | Admitting: Neurology

## 2020-11-03 ENCOUNTER — Encounter: Payer: Self-pay | Admitting: Internal Medicine

## 2020-11-03 ENCOUNTER — Other Ambulatory Visit: Payer: Self-pay

## 2020-11-03 VITALS — BP 132/70 | HR 60 | Temp 97.7°F | Ht 66.0 in | Wt 165.4 lb

## 2020-11-03 DIAGNOSIS — G20 Parkinson's disease: Secondary | ICD-10-CM | POA: Diagnosis not present

## 2020-11-03 DIAGNOSIS — R972 Elevated prostate specific antigen [PSA]: Secondary | ICD-10-CM

## 2020-11-03 DIAGNOSIS — K5901 Slow transit constipation: Secondary | ICD-10-CM

## 2020-11-03 DIAGNOSIS — E1122 Type 2 diabetes mellitus with diabetic chronic kidney disease: Secondary | ICD-10-CM | POA: Diagnosis not present

## 2020-11-03 DIAGNOSIS — Z79899 Other long term (current) drug therapy: Secondary | ICD-10-CM

## 2020-11-03 DIAGNOSIS — I10 Essential (primary) hypertension: Secondary | ICD-10-CM

## 2020-11-03 DIAGNOSIS — I442 Atrioventricular block, complete: Secondary | ICD-10-CM

## 2020-11-03 DIAGNOSIS — Z0000 Encounter for general adult medical examination without abnormal findings: Secondary | ICD-10-CM

## 2020-11-03 DIAGNOSIS — I700 Atherosclerosis of aorta: Secondary | ICD-10-CM

## 2020-11-03 DIAGNOSIS — E663 Overweight: Secondary | ICD-10-CM

## 2020-11-03 DIAGNOSIS — E1151 Type 2 diabetes mellitus with diabetic peripheral angiopathy without gangrene: Secondary | ICD-10-CM

## 2020-11-03 DIAGNOSIS — J8410 Pulmonary fibrosis, unspecified: Secondary | ICD-10-CM

## 2020-11-03 DIAGNOSIS — N1831 Chronic kidney disease, stage 3a: Secondary | ICD-10-CM

## 2020-11-03 DIAGNOSIS — I129 Hypertensive chronic kidney disease with stage 1 through stage 4 chronic kidney disease, or unspecified chronic kidney disease: Secondary | ICD-10-CM

## 2020-11-03 DIAGNOSIS — Z6826 Body mass index (BMI) 26.0-26.9, adult: Secondary | ICD-10-CM

## 2020-11-03 LAB — POCT URINALYSIS DIPSTICK
Bilirubin, UA: NEGATIVE
Blood, UA: NEGATIVE
Glucose, UA: NEGATIVE
Ketones, UA: NEGATIVE
Leukocytes, UA: NEGATIVE
Nitrite, UA: NEGATIVE
Protein, UA: POSITIVE — AB
Spec Grav, UA: 1.025 (ref 1.010–1.025)
Urobilinogen, UA: 0.2 E.U./dL
pH, UA: 6 (ref 5.0–8.0)

## 2020-11-03 LAB — POCT UA - MICROALBUMIN
Albumin/Creatinine Ratio, Urine, POC: 300
Creatinine, POC: 300 mg/dL
Microalbumin Ur, POC: 150 mg/L

## 2020-11-03 NOTE — Patient Instructions (Signed)

## 2020-11-03 NOTE — Progress Notes (Signed)
I,Tianna Badgett,acting as a Education administrator for Maximino Greenland, MD.,have documented all relevant documentation on the behalf of Maximino Greenland, MD,as directed by  Maximino Greenland, MD while in the presence of Maximino Greenland, MD.  This visit occurred during the SARS-CoV-2 public health emergency.  Safety protocols were in place, including screening questions prior to the visit, additional usage of staff PPE, and extensive cleaning of exam room while observing appropriate contact time as indicated for disinfecting solutions.  Subjective:     Patient ID: Dylan [name]. , male    DOB: Apr 01, 1941 , 80 y.o.   MRN: [id number]   Chief Complaint  Patient presents with  . Annual Exam  . Diabetes  . Hypertension    HPI  Patient is here today for physical exam. He is compliant with medications. He is accompanied by his wife today. He denies chest pain and shortness of breath. Admittedly, he is not moving around much since being diagnosed with Parkinson's. He is now ambulatory with a walker.   At last visit, they were interested in getting prostate tested. He was not followed by Urology at the time. PSA was found to be elevated, so he is now under the care of Urology for possible prostate cancer. He is scheduled for upcoming TRUS biopsy, despite counseling regarding pt's current comorbidities.   Diabetes He presents for his follow-up diabetic visit. He has type 2 diabetes mellitus. There are no hypoglycemic associated symptoms. Pertinent negatives for hypoglycemia include no dizziness. Associated symptoms include weakness. Pertinent negatives for diabetes include no blurred vision. There are no hypoglycemic complications. Diabetic complications include nephropathy. Risk factors for coronary artery disease include diabetes mellitus, dyslipidemia, hypertension, sedentary lifestyle, male sex and obesity. He is compliant with treatment some of the time. He is following a diabetic diet. He participates in  exercise intermittently. His breakfast blood glucose is taken between 9-10 am. His breakfast blood glucose range is generally 140-180 mg/dl. An ACE inhibitor/angiotensin II receptor blocker is being taken. He does not see a podiatrist.Eye exam is current.  Hypertension This is a chronic problem. The current episode started more than 1 year ago. The problem has been gradually improving since onset. The problem is controlled. Pertinent negatives include no blurred vision, palpitations or shortness of breath. Risk factors for coronary artery disease include diabetes mellitus, dyslipidemia, male gender, obesity and sedentary lifestyle. There is no history of sleep apnea.     Past Medical History:  Diagnosis Date  . Anxiety   . Aortic stenosis    mild AS 11/24/16 (peak grad 24, mean grad 11) Dr. [name]  . CHB (complete heart block) ([place]) 07/2017  . 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 Diabetes mellitus without complication ([place])   . Encounter for care of pacemaker 07/11/2020  . Gait abnormality   . Gout   . Hyperlipemia   . Hypertension   . 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 Memory loss   . Pacemaker Dual chamber [place] MRI  model [id number] 07/11/2020 06/08/2020  . Peripheral arterial disease ([place])   . 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 Second degree AV block    Wenckebach; no indication for pacemaker as of 12/01/16 (Dr. [name])  . Vitamin B12 deficiency anemia 03/01/2018  . Vitamin D deficiency disease      Family History  Problem Relation Age of Onset  . Diabetes Mother   . Breast  cancer Mother   . Arthritis Mother   . Hypertension Father   . Diabetes Sister   . Diabetes Brother   . Diabetes Maternal Grandmother   . Diabetes Brother   . Diabetes Brother   . Diabetes Brother   . Diabetes Sister   . Diabetes Sister   . Diabetes Sister      Current Outpatient Medications:  .   linaclotide (LINZESS) 72 MCG capsule, Take 1 capsule (72 mcg total) by mouth daily before breakfast., Disp: 30 capsule, Rfl: 0 .  Ascorbic Acid (VITAMIN C) 1000 MG tablet, Take 1,000 mg by mouth every morning. , Disp: , Rfl:  .  carbidopa-levodopa (SINEMET IR) 25-100 MG tablet, Take 2 tablets by mouth 3 (three) times daily., Disp: 180 tablet, Rfl: 11 .  carvedilol (COREG) 12.5 MG tablet, TAKE 1 TABLET BY MOUTH 2 TIMES DAILY. (Patient taking differently: Take 12.5 mg by mouth 2 (two) times daily with a meal.), Disp: 60 tablet, Rfl: 11 .  Cholecalciferol (VITAMIN D3) 5000 units CAPS, Take 5,000 Units by mouth daily with supper. , Disp: , Rfl:  .  COLCRYS 0.6 MG tablet, Take 1 tablet (0.6 mg total) by mouth daily., Disp: 90 tablet, Rfl: 1 .  desonide (DESOWEN) 0.05 % lotion, Apply topically 2 times per day prn, Disp: 59 mL, Rfl: 1 .  diclofenac Sodium (VOLTAREN) 1 % GEL, Apply 2 g topically 3 (three) times daily., Disp: 150 g, Rfl: 0 .  glucose blood (ONETOUCH VERIO) test strip, Use as instructed to check blood sugars daily dx: e11.22, Disp: 150 each, Rfl: 3 .  Lancet Devices (ONE TOUCH DELICA LANCING DEV) MISC, Please fill ONE TOUCH DELICA LANCING DEVICE.  Use to test blood sugar twice daily as directed. E11.65 (Patient taking differently: Please fill ONE TOUCH DELICA LANCING DEVICE.  Use to test blood sugar twice daily as directed. E11.65), Disp: 1 each, Rfl: 1 .  Lancets MISC, Please fill basic/generic push button lancets.  Patient to test blood sugar twice daily. DX: E11.65, Disp: 100 each, Rfl: 4 .  loratadine (CLARITIN) 10 MG tablet, Take 1 tablet (10 mg total) by mouth daily., Disp: 90 tablet, Rfl: 1 .  Melatonin 10 MG TABS, Take 10 mg by mouth at bedtime. , Disp: , Rfl:  .  memantine (NAMENDA) 10 MG tablet, TAKE 1 TABLET BY MOUTH TWICE A DAY (Patient taking differently: Take 10 mg by mouth 2 (two) times daily.), Disp: 60 tablet, Rfl: 14 .  metFORMIN (GLUCOPHAGE) 1000 MG tablet, Take 1 tablet  (1,000 mg total) by mouth 2 (two) times daily with a meal., Disp: 160 tablet, Rfl: 1 .  Pimavanserin Tartrate (NUPLAZID) 10 MG TABS, Take 10 mg by mouth daily., Disp: 30 tablet, Rfl: 3 .  pravastatin (PRAVACHOL) 20 MG tablet, TAKE 1 TABLET BY ORAL ROUTE EVERY DAY, Disp: 30 tablet, Rfl: 2 .  QUEtiapine (SEROQUEL) 25 MG tablet, TAKE 1 TABLET (25 MG TOTAL) BY MOUTH AT BEDTIME AS NEEDED. (Patient not taking: No sig reported), Disp: 90 tablet, Rfl: 1 .  rivastigmine (EXELON) 4.6 mg/24hr, Place 1 patch (4.6 mg total) onto the skin daily., Disp: 30 patch, Rfl: 12 .  TRULICITY 1.5 JH/4.1DE SOPN, INJECT 1.5 MG INTO THE SKIN ONCE A WEEK. (Patient taking differently: Inject 1.5 mg into the skin every Monday.), Disp: 12 mL, Rfl: 2  Current Facility-Administered Medications:  .  cyanocobalamin ((VITAMIN B-12)) injection 1,000 mcg, 1,000 mcg, Intramuscular, Once, [name], MD   No Known Allergies   Men's  preventive visit. Patient Health Questionnaire (PHQ-2) is  Flowsheet Row Clinical Support from 10/21/2020 in [facility]  PHQ-2 Total Score 0    . Patient is on a low sodium diet. Marital status: Married. Relevant history for alcohol use is:  Social History   Substance and Sexual Activity  Alcohol Use No  . Relevant history for tobacco use is:  Social History   Tobacco Use  Smoking Status Former Smoker  . Packs/day: 0.50  . Years: 7.00  . Pack years: 3.50  . Types: Cigarettes  Smokeless Tobacco Never Used  Tobacco Comment   quit 35 years  .   Review of Systems  Constitutional: Negative.   HENT: Negative.   Eyes: Negative.  Negative for blurred vision.  Respiratory: Negative.  Negative for shortness of breath.   Cardiovascular: Negative.  Negative for palpitations.  Gastrointestinal: Negative.   Endocrine: Negative.   Genitourinary: Positive for frequency.  Musculoskeletal: Negative.   Skin: Negative.   Allergic/Immunologic: Negative.   Neurological:  Positive for weakness. Negative for dizziness.  Hematological: Negative.   Psychiatric/Behavioral: Negative.      Today's Vitals   11/03/20 1153  BP: 132/70  Pulse: 60  Temp: 97.7 F (36.5 C)  TempSrc: Oral  Weight: 165 lb 6.4 oz (75 kg)  Height: 5\' 6"  (1.676 m)   Body mass index is 26.7 kg/m.  Wt Readings from Last 3 Encounters:  12/15/20 168 lb (76.2 kg)  12/08/20 168 lb (76.2 kg)  12/01/20 168 lb (76.2 kg)    Objective:  Physical Exam Vitals and nursing note reviewed.  Constitutional:      Appearance: Normal appearance. He is obese.  HENT:     Head: Normocephalic and atraumatic.     Right Ear: Tympanic membrane, ear canal and external ear normal.     Left Ear: Tympanic membrane, ear canal and external ear normal.     Nose:     Comments: Deferred, masked    Mouth/Throat:     Comments: Masked  Eyes:     Extraocular Movements: Extraocular movements intact.     Conjunctiva/sclera: Conjunctivae normal.     Pupils: Pupils are equal, round, and reactive to light.  Cardiovascular:     Rate and Rhythm: Normal rate and regular rhythm.     Pulses:          Dorsalis pedis pulses are 0 on the right side and 0 on the left side.     Heart sounds: Normal heart sounds.  Pulmonary:     Effort: Pulmonary effort is normal.     Breath sounds: Normal breath sounds.  Chest:  Breasts:     Right: Normal. No swelling, bleeding, inverted nipple, mass or nipple discharge.     Left: Normal. No swelling, bleeding, inverted nipple, mass or nipple discharge.    Abdominal:     General: Bowel sounds are normal.     Palpations: Abdomen is soft.     Comments: Rounded, soft  Genitourinary:    Prostate: Normal.     Rectum: Normal. Guaiac result negative.  Musculoskeletal:        General: Normal range of motion.     Cervical back: Normal range of motion and neck supple.     Comments: Ambulatory with walker  Feet:     Right foot:     Protective Sensation: 5 sites tested. 5 sites sensed.      Skin integrity: Callus and dry skin present.     Toenail Condition:  Right toenails are abnormally thick.     Left foot:     Protective Sensation: 5 sites tested. 5 sites sensed.     Skin integrity: Callus and dry skin present.     Toenail Condition: Left toenails are abnormally thick.  Skin:    General: Skin is warm.  Neurological:     Mental Status: He is alert and oriented to person, place, and time. Mental status is at baseline.     Motor: Weakness present.     Gait: Gait abnormal.     Comments: Ambulatory with walker, shuffling gait  Psychiatric:        Mood and Affect: Mood normal.        Behavior: Behavior normal.         Assessment And Plan:    1. Routine general medical examination at health care facility Comments: A full exam was performed. He was unable to get on exam table, he was examined in chair. DRE deferred.PATIENT IS ADVISED TO GET 30-45 MINUTES REGULAR EXERCISE NO LESS THAN FOUR TO FIVE DAYS PER WEEK - BOTH WEIGHTBEARING EXERCISES AND AEROBIC ARE RECOMMENDED.  PATIENT IS ADVISED TO FOLLOW A HEALTHY DIET WITH AT LEAST SIX FRUITS/VEGGIES PER DAY, DECREASE INTAKE OF RED MEAT, AND TO INCREASE FISH INTAKE TO TWO DAYS PER WEEK.  MEATS/FISH SHOULD NOT BE FRIED, BAKED OR BROILED IS PREFERABLE.  I SUGGEST WEARING SPF 50 SUNSCREEN ON EXPOSED PARTS AND ESPECIALLY WHEN IN THE DIRECT SUNLIGHT FOR AN EXTENDED PERIOD OF TIME.  PLEASE AVOID FAST FOOD RESTAURANTS AND INCREASE YOUR WATER INTAKE.  2. Type 2 diabetes mellitus with stage 3a chronic kidney disease, without long-term current use of insulin (HCC) Comments: Diabetic foot exam was performed. I DISCUSSED WITH THE PATIENT AT LENGTH REGARDING THE GOALS OF GLYCEMIC CONTROL AND POSSIBLE LONG-TERM COMPLICATIONS.  I  ALSO STRESSED THE IMPORTANCE OF COMPLIANCE WITH HOME GLUCOSE MONITORING, DIETARY RESTRICTIONS INCLUDING AVOIDANCE OF SUGARY DRINKS/PROCESSED FOODS,  ALONG WITH REGULAR EXERCISE.  I  ALSO STRESSED THE IMPORTANCE OF ANNUAL  EYE EXAMS, SELF FOOT CARE AND COMPLIANCE WITH OFFICE VISITS.  - Hemoglobin A1c - Lipid panel - POCT Urinalysis Dipstick (81002) - POCT UA - Microalbumin  3. Hypertensive nephropathy Comments: Chronic, controlled. Also managed by Cardiology. He is encouraged to follow low sodium diet. EKG not performed, previous results reviewed.   4. Parkinson's disease ([place]) Comments: Importance of medication compliance was discussed with the patient. He will c/w Sinemet and Nuplazid as per Neurology.  5. 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   6. Thoracic aortic atherosclerosis ([place]) Comments: Importance of statin compliance was discussed with the patient. Unfortunately his mobility is limited due to Parkinsons. Advised to follow heart healthy diet.   7. Slow transit constipation Comments: He was given sample of Linzess 72mg  to take once daily. He/wife will let me know if this is effective. Advised to take upon awakening and encouraged to stay hyd  8. Elevated PSA Comments: He is currently under the care of Urology.   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 Granuloma still present on 2019 CXR. However, not mentioned on most recent [place] 2021 CXR results.   10. Drug therapy - Vitamin B12  11. Overweight with body mass index (BMI) of 26 to 26.9 in adult Comments: His weight/BMI is acceptable for his demographic. He is encouraged to incorporate more chair exercises into his daily routine.   Patient was given opportunity to ask questions. Patient verbalized understanding  of the plan and was able to repeat key elements of the plan. All questions were answered to their satisfaction.   I, Maximino Greenland, MD, have reviewed all documentation for this visit. The documentation on 12/19/20 for the exam, diagnosis, procedures, and orders are all accurate and complete.  THE PATIENT IS ENCOURAGED TO PRACTICE SOCIAL DISTANCING DUE TO THE COVID-19 PANDEMIC.

## 2020-11-03 NOTE — Telephone Encounter (Signed)
PA submitted through cover my meds/ [employer] [id number] Will wait for determination

## 2020-11-04 ENCOUNTER — Ambulatory Visit: Payer: Self-pay

## 2020-11-04 ENCOUNTER — Telehealth: Payer: Medicare HMO

## 2020-11-04 DIAGNOSIS — F0391 Unspecified dementia with behavioral disturbance: Secondary | ICD-10-CM

## 2020-11-04 DIAGNOSIS — I10 Essential (primary) hypertension: Secondary | ICD-10-CM

## 2020-11-04 DIAGNOSIS — G20 Parkinson's disease: Secondary | ICD-10-CM

## 2020-11-04 DIAGNOSIS — N1831 Chronic kidney disease, stage 3a: Secondary | ICD-10-CM

## 2020-11-04 DIAGNOSIS — G20A1 Parkinson's disease without dyskinesia, without mention of fluctuations: Secondary | ICD-10-CM

## 2020-11-04 DIAGNOSIS — E1122 Type 2 diabetes mellitus with diabetic chronic kidney disease: Secondary | ICD-10-CM

## 2020-11-04 DIAGNOSIS — N183 Chronic kidney disease, stage 3 unspecified: Secondary | ICD-10-CM

## 2020-11-04 LAB — VITAMIN B12: Vitamin B-12: 269 pg/mL (ref 232–1245)

## 2020-11-04 LAB — LIPID PANEL
Chol/HDL Ratio: 2.6 ratio (ref 0.0–5.0)
Cholesterol, Total: 126 mg/dL (ref 100–199)
HDL: 49 mg/dL (ref >39)
LDL Chol Calc (NIH): 65 mg/dL (ref 0–99)
Triglycerides: 52 mg/dL (ref 0–149)
VLDL Cholesterol Cal: 12 mg/dL (ref 5–40)

## 2020-11-04 LAB — HEMOGLOBIN A1C
Est. average glucose Bld gHb Est-mCnc: 154 mg/dL
Hgb A1c MFr Bld: 7 % — ABNORMAL HIGH (ref 4.8–5.6)

## 2020-11-04 NOTE — Telephone Encounter (Signed)
PA approved through [employer]. approval authorizes your coverage from 08/05/2020 - 11/03/2021

## 2020-11-05 ENCOUNTER — Telehealth: Payer: Self-pay

## 2020-11-05 IMAGING — CR DG ABDOMEN 2V
3 series · 3 of 3 positions shown · non-contrast
Comparison: None.

CLINICAL DATA: Fall with rib pain

EXAM:
ABDOMEN - 2 VIEW

[abdomen supine (1 of 2)]
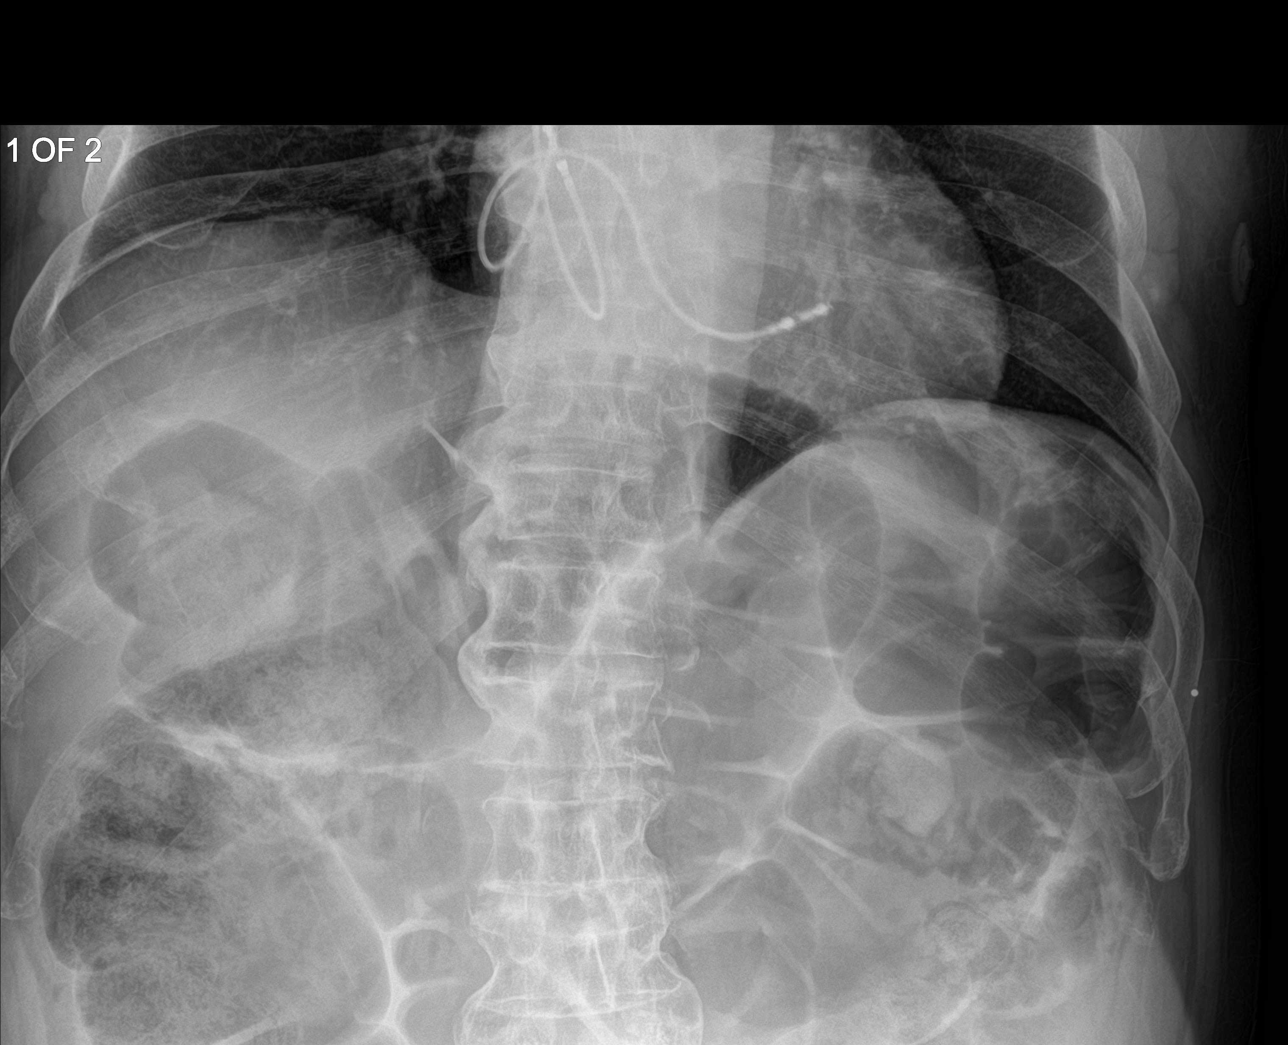

[abdomen supine (2 of 2)]
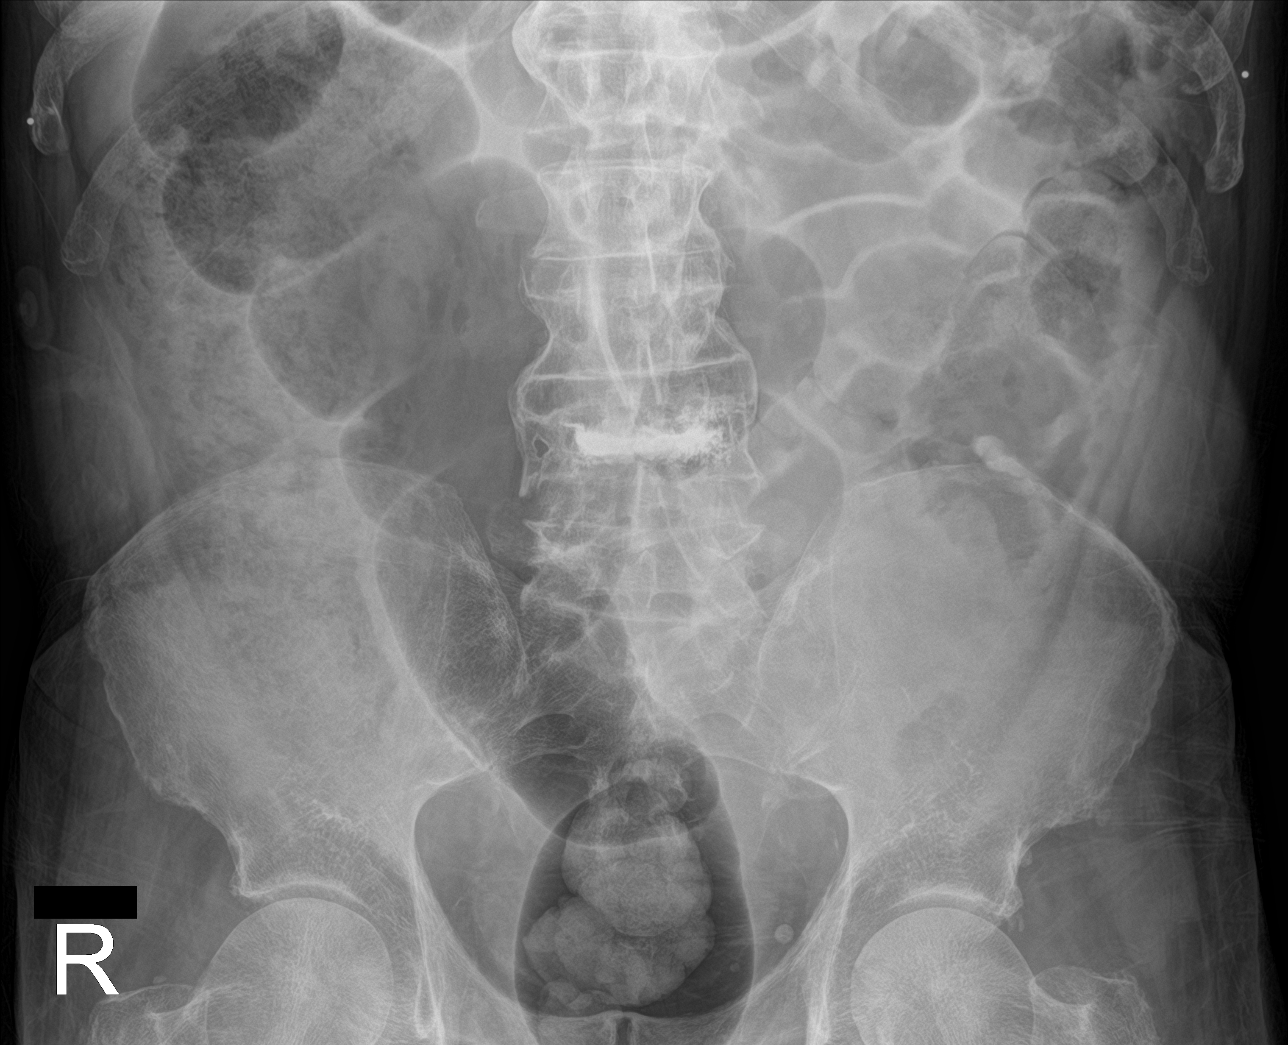

[abdomen decu]
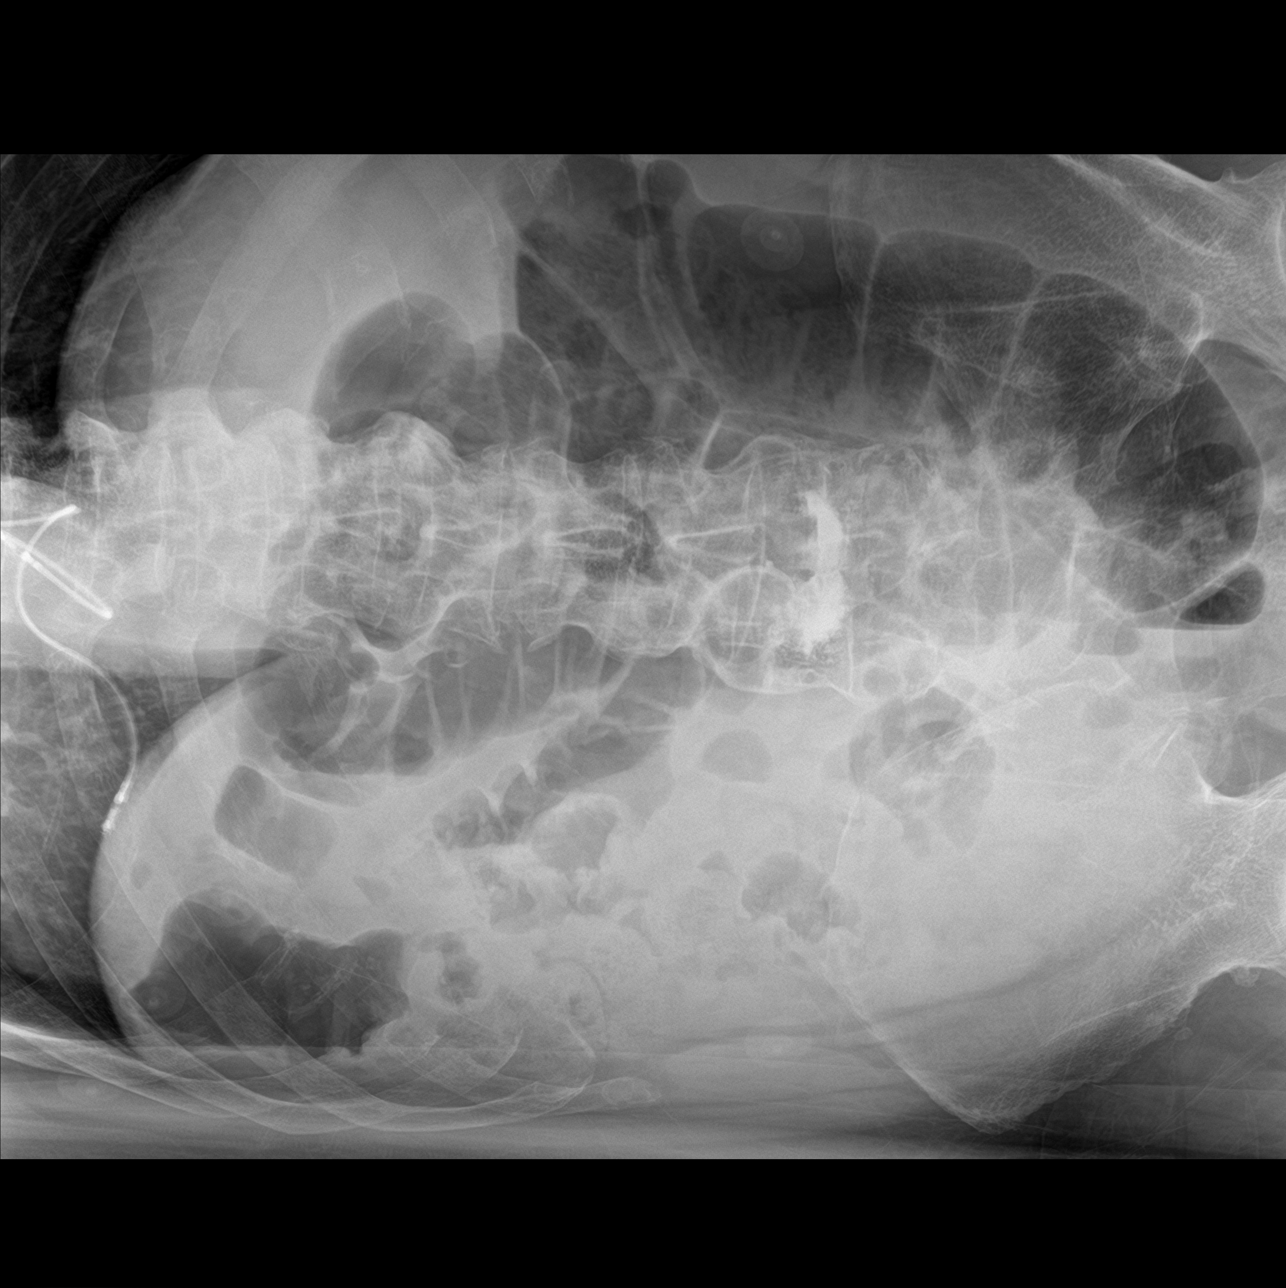

[3 of 3 positions shown; findings below may reference images not displayed]

FINDINGS: Mild diffuse gaseous prominence of the bowel without obstructive
pattern. Moderate stool in the right colon. There is no evidence of
free air. No radio-opaque calculi or other significant radiographic
abnormality is seen.
IMPRESSION: Mild diffuse gaseous prominence of the bowel without obstructive
pattern.

## 2020-11-05 IMAGING — CR DG RIBS W/ CHEST 3+V BILAT
8 of 9 series · 8 of 9 positions shown · non-contrast
Comparison: 04/26/2020

CLINICAL DATA: Rib pain after fall

EXAM:
BILATERAL RIBS AND CHEST - 4+ VIEW

[rib ap (1 of 4)]
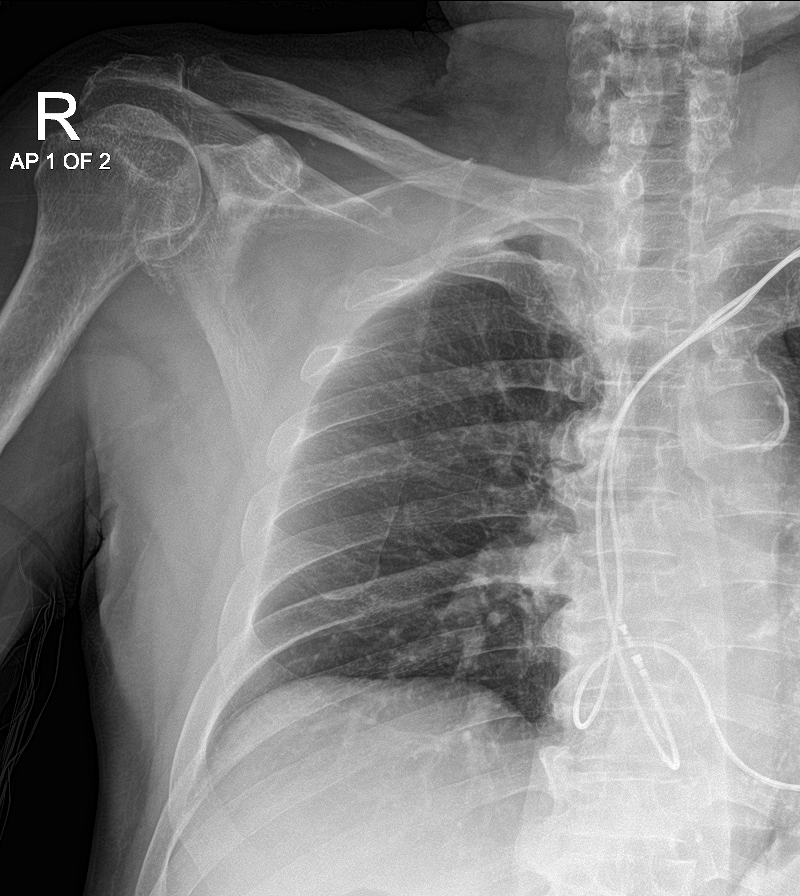

[rib ap (2 of 4)]
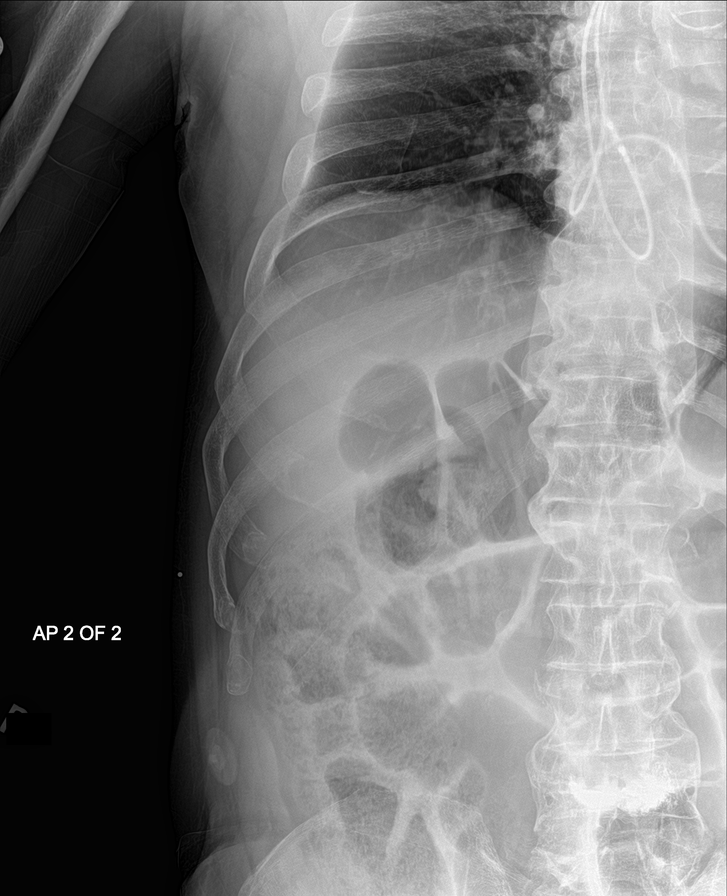

[chest ap]
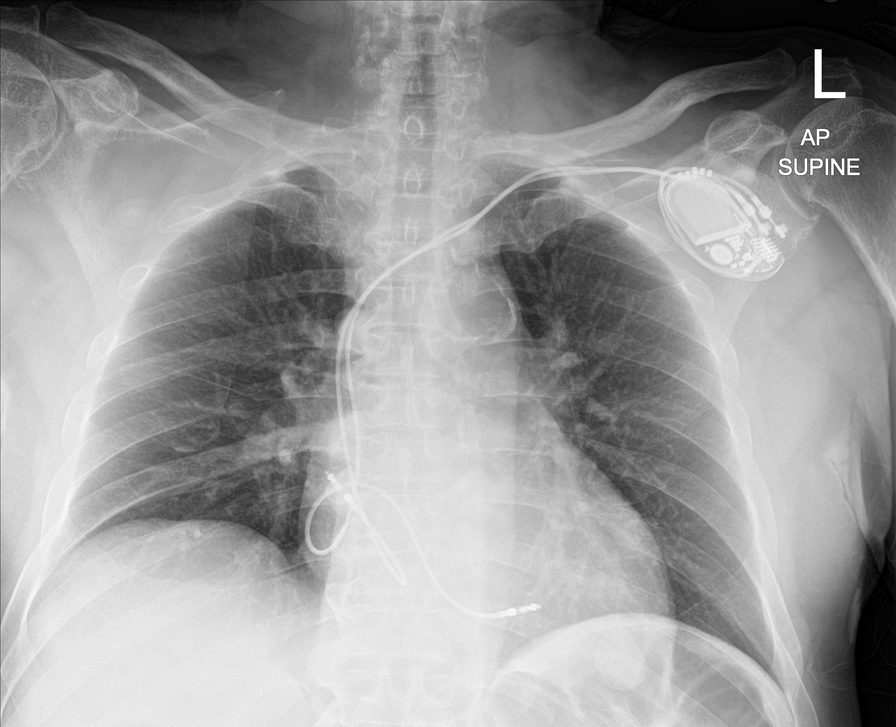

[rib ap (3 of 4)]
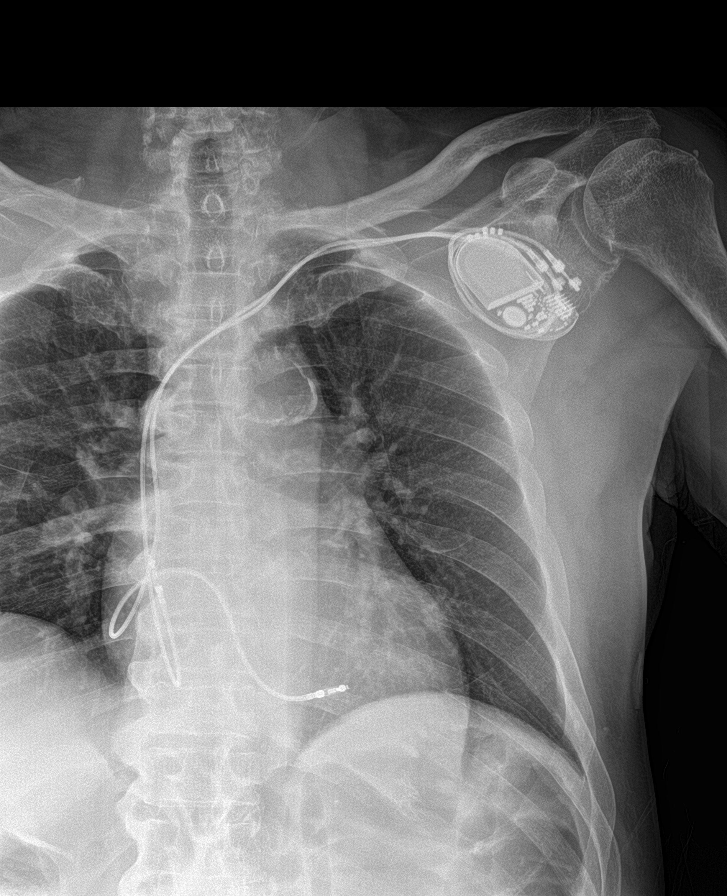

[rib ap (4 of 4)]
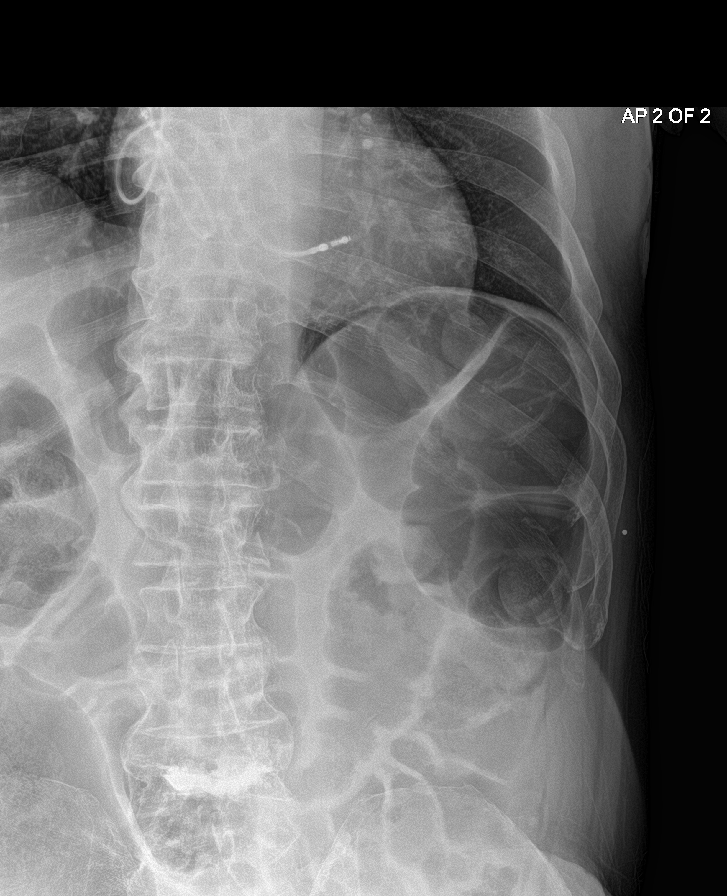

[rib ap obl (1 of 3)]
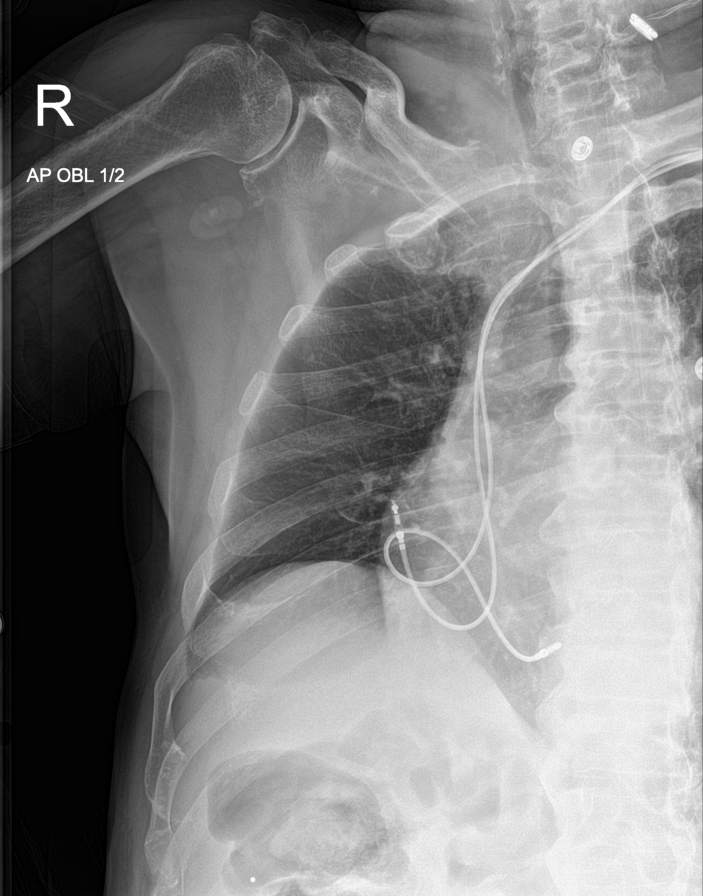

[rib ap obl (2 of 3)]
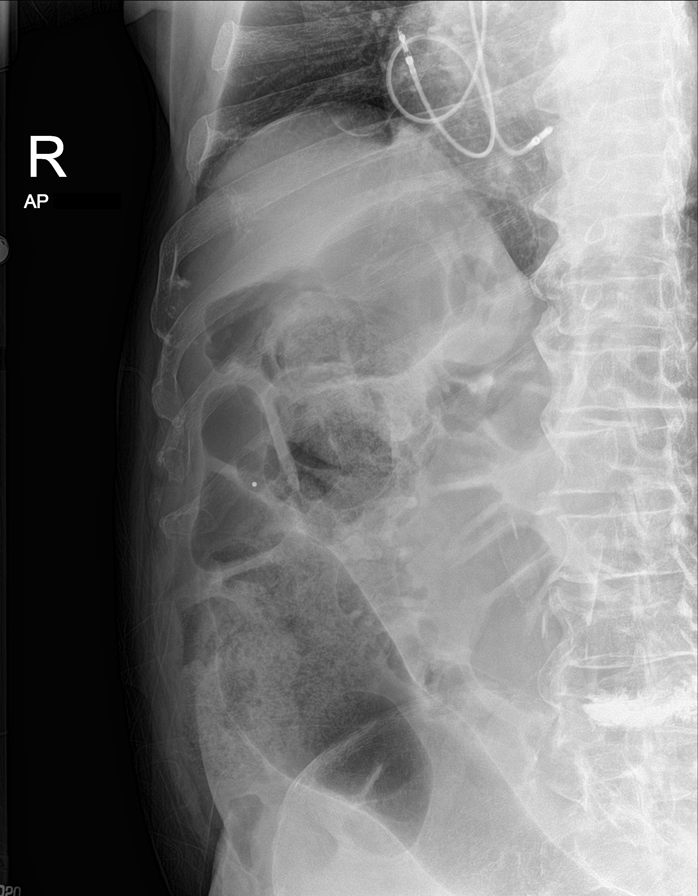

[rib ap obl (3 of 3)]
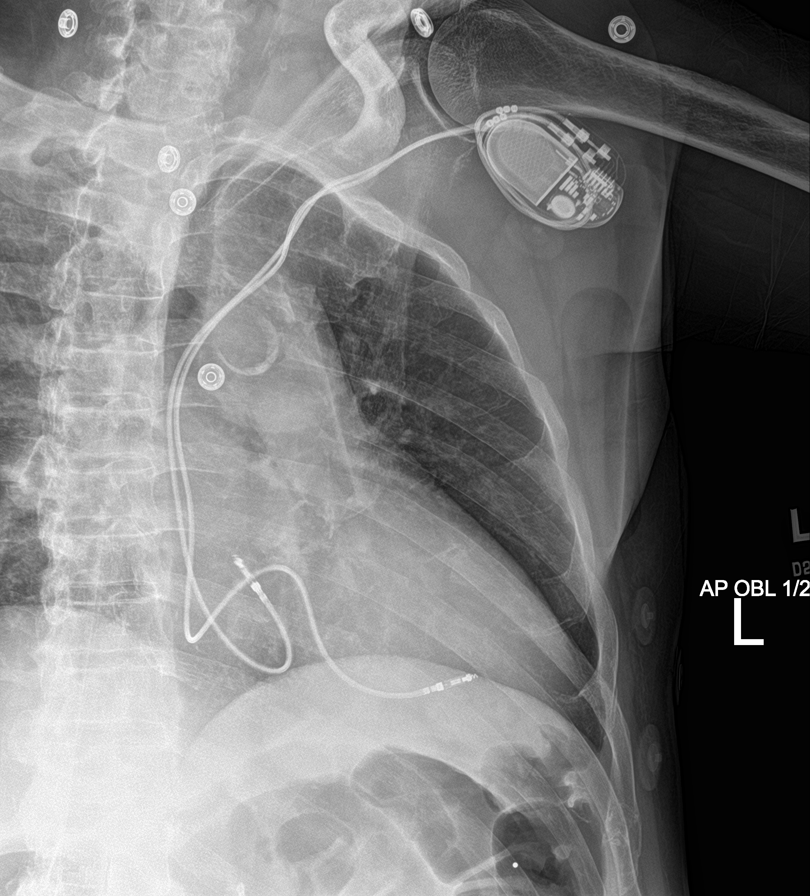

[8 of 9 positions shown; findings below may reference images not displayed]

FINDINGS: Single-view chest demonstrates stable left-sided pacing device.
Stable cardiomediastinal silhouette with aortic atherosclerosis. No
focal opacity, pleural effusion or pneumothorax.

Bilateral rib series demonstrates no acute displaced rib fracture.
IMPRESSION: Negative.

## 2020-11-05 NOTE — Telephone Encounter (Signed)
Voice mail is full  

## 2020-11-05 NOTE — Telephone Encounter (Signed)
-----   Message from [name], MD sent at 11/04/2020 10:17 PM EST ----- Your hba1c is 7.0, up from last visit. Vitamin B12 leveli slow end of normal. I would like to resume vitamin B12 injections weekly. Are you willing to do weekly vitamin B12 injections? If he agrees, let's send referral to [facility] remote. If he can come in Friday morning, please schedule him for nurse visit for first injection. Pls check to see if [facility] remote can do weekly b12 injections.

## 2020-11-06 ENCOUNTER — Ambulatory Visit: Payer: Medicare HMO

## 2020-11-06 DIAGNOSIS — I10 Essential (primary) hypertension: Secondary | ICD-10-CM

## 2020-11-06 DIAGNOSIS — E1122 Type 2 diabetes mellitus with diabetic chronic kidney disease: Secondary | ICD-10-CM

## 2020-11-06 DIAGNOSIS — G20A1 Parkinson's disease without dyskinesia, without mention of fluctuations: Secondary | ICD-10-CM

## 2020-11-06 DIAGNOSIS — G20 Parkinson's disease: Secondary | ICD-10-CM

## 2020-11-06 DIAGNOSIS — F0391 Unspecified dementia with behavioral disturbance: Secondary | ICD-10-CM

## 2020-11-06 DIAGNOSIS — N1831 Type 2 diabetes mellitus with diabetic chronic kidney disease: Secondary | ICD-10-CM

## 2020-11-06 LAB — PROTEIN ELECTROPHORESIS
A/G Ratio: 1.1 (ref 0.7–1.7)
Albumin ELP: 3.5 g/dL (ref 2.9–4.4)
Alpha 1: 0.2 g/dL (ref 0.0–0.4)
Alpha 2: 1 g/dL (ref 0.4–1.0)
Beta: 1.2 g/dL (ref 0.7–1.3)
Gamma Globulin: 0.8 g/dL (ref 0.4–1.8)
Globulin, Total: 3.3 g/dL (ref 2.2–3.9)
Total Protein: 6.8 g/dL (ref 6.0–8.5)

## 2020-11-06 LAB — SPECIMEN STATUS REPORT

## 2020-11-06 NOTE — Patient Instructions (Signed)
   Goals we discussed today:  Goals Addressed            This Visit's Progress   . Work with SW to manage care coordination needs       Timeframe:  Short-Term Goal Priority:  High Start Date:   12.7.21                        Expected End Date:   3.7.22                    Next expected date of contact: 11/25/20   Patient Goals/Self-Care Activities Over the next 30 days, patient will: With the help of [name]  - Patient will self administer medications as prescribed -Patient will attend all scheduled provider appointments -Patient will call provider office for new concerns or questions -Patient will work with BSW to address care coordination needs and will continue to work with the clinical team to address health care and disease management related needs.   -Follow up with DSS Social Worker Mrs. Hunt

## 2020-11-06 NOTE — Chronic Care Management (AMB) (Signed)
Chronic Care Management   Follow Up Note   11/04/2020 Name: Dylan [name]. MRN: [id number] DOB: 1941/09/29  Referred by: [name], MD Reason for referral : Chronic Care Management (RN CM FU Call)   Dylan Ina. is a 80 y.o. year old male who is a primary care patient of [name], MD. The CCM team was consulted for assistance with chronic disease management and care coordination needs.    Review of patient status, including review of consultants reports, relevant laboratory and other test results, and collaboration with appropriate care team members and the patient's provider was performed as part of comprehensive patient evaluation and provision of chronic care management services.    SDOH (Social Determinants of Health) assessments performed: Yes See Care Plan activities for detailed interventions related to [place])   Placed outbound CCM RN CM follow up call to patient's spouse for a care plan update.     Outpatient Encounter Medications as of 11/04/2020  Medication Sig  . Ascorbic Acid (VITAMIN C) 1000 MG tablet Take 1,000 mg by mouth every morning.   . carbidopa-levodopa (SINEMET IR) 25-100 MG tablet Take 2 tablets by mouth 3 (three) times daily.  . carvedilol (COREG) 12.5 MG tablet TAKE 1 TABLET BY MOUTH 2 TIMES DAILY. (Patient taking differently: Take 12.5 mg by mouth 2 (two) times daily with a meal.)  . Cholecalciferol (VITAMIN D3) 5000 units CAPS Take 5,000 Units by mouth daily with supper.   . COLCRYS 0.6 MG tablet Take 1 tablet (0.6 mg total) by mouth daily.  [name] desonide (DESOWEN) 0.05 % lotion Apply topically 2 times per day prn  . diclofenac Sodium (VOLTAREN) 1 % GEL Apply 2 g topically 3 (three) times daily.  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 Lancet Devices (ONE TOUCH DELICA LANCING DEV) MISC Please fill ONE TOUCH DELICA LANCING DEVICE.  Use to test blood sugar twice daily as directed.  E11.65 (Patient taking differently: Please fill ONE TOUCH DELICA LANCING DEVICE.  Use to test blood sugar twice daily as directed. E11.65)  . Lancets MISC Please fill basic/generic push button lancets.  Patient to test blood sugar twice daily. DX: E11.65  . Melatonin 10 MG TABS Take 10 mg by mouth at bedtime.   . memantine (NAMENDA) 10 MG tablet TAKE 1 TABLET BY MOUTH TWICE A DAY (Patient taking differently: Take 10 mg by mouth 2 (two) times daily.)  . metFORMIN (GLUCOPHAGE) 1000 MG tablet Take 1 tablet (1,000 mg total) by mouth 2 (two) times daily with a meal.  . Pimavanserin Tartrate (NUPLAZID) 10 MG TABS Take 10 mg by mouth daily.  . pravastatin (PRAVACHOL) 20 MG tablet TAKE 1 TABLET BY ORAL ROUTE EVERY DAY (Patient taking differently: Take 20 mg by mouth daily.)  . QUEtiapine (SEROQUEL) 25 MG tablet TAKE 1 TABLET (25 MG TOTAL) BY MOUTH AT BEDTIME AS NEEDED. (Patient not taking: Reported on 11/04/2020)  . rivastigmine (EXELON) 4.6 mg/24hr Place 1 patch (4.6 mg total) onto the skin daily.  . TRULICITY 1.5 BP/1.0CH SOPN INJECT 1.5 MG INTO THE SKIN ONCE A WEEK. (Patient taking differently: Inject 1.5 mg into the skin every Monday.)   No facility-administered encounter medications on file as of 11/04/2020.     Objective:  Lab Results  Component Value Date   HGBA1C 7.0 (H) 11/03/2020   HGBA1C 6.4 (H) 09/07/2020   HGBA1C 7.0 (H) 06/03/2020   Lab Results  Component Value Date  MICROALBUR 150 11/03/2020   LDLCALC 65 11/03/2020   CREATININE 1.04 10/15/2020   BP Readings from Last 3 Encounters:  11/03/20 132/70  10/26/20 133/63  10/21/20 140/70    Goals Addressed     Patient Care Plan: Dementia (Adult)    Problem Identified: Behavioral Symptoms   Priority: High    Goal: Behavior Symptoms Management   Start Date: 10/15/2020  Expected End Date: 11/16/2020  Recent Progress: On track  Priority: High  Note:   Current Barriers:   Ineffective Self Health Maintenance  Unable to self  administer medications as prescribed  Lacks social connections  Unable to perform ADLs independently  Unable to perform IADLs independently  Currently UNABLE TO independently self manage needs related to chronic health conditions.   Knowledge Deficits related to short term plan for care coordination needs and long term plans for chronic disease management needs Nurse Case Manager Clinical Goal(s):   Over the next 30 days, patient will work with care management team to address care coordination and chronic disease management needs related to Disease Management  Educational Needs  Care Coordination  Medication Management and Education  Psychosocial Support  Dementia and Caregiver Support   Interventions:   Determined Dylan [name] followed up with Neuro for further evaluation and treatment of his Parkinson's disease and worsening behavior changes  Reviewed and discussed the following Assessment/Plan per Neurology f/u completed on 10/26/20:   ASSESSMENT AND PLAN  80 y.o. 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 here with:  1.  Parkinsonian syndrome  2.  Dementia with behavioral issues  -Last seen by Dr. Krista Blue in Sept 2021, most likely central nervous system degenerative disorder such as progressive nuclear palsy, versus multisystem atrophy,  parkinsonian types  -More agitation, combative, hallucinations  -Decrease Exelon patch from 9.5 to 4.6 mg daily (possibly related to increased agitation/hallucinations?)  -Add in Nuplazid 10 mg daily, will take 1-2 weeks to get approval, once approved, cut back Seroquel 25 mg 1/2 tablet for 3 days, then stop, may then start Nuplazid  -Will reach out to case manager, see what can be done about in-home aide, SNF  -Follow-up in 6 to 8 weeks or sooner if needed with Dr. Krista Blue   -Dr. Krista Blue is out of the office today, plan discussed with Dr. Rexene Alberts  Advised spouse, it is noted the PA for Nuplazid has been approved, spouse will plan to fill this medication with the pharmacy and administer to patient as directed  Determined she has stopped his Seroquel due to noticing his mood and agitation are worsened by taking the drug  Discussed with spouse Dylan [name] for ongoing care management follow up and provided patient with direct contact information for care management team Patient Goals/Self Care Activities:  Over the next 30 days, patient/caregiver will: - start Nuplazid as directed by Neurology and take all other medications exactly as prescribed - contact the CCM team and or PCP for further concerns or questions  - call 911 for emergencies   Follow Up  Plan: Telephone follow up appointment with care management team member scheduled for: 12/25/20   Problem Identified: Caregiver Stress   Priority: High    Long-Range Goal: Caregiver Coping Optimized   Start Date: 10/15/2020  Expected End Date: 01/13/2021  Recent Progress: On track  Priority: High  Note:   Current Barriers:   Ineffective Self Health Maintenance  Unable to self administer medications as prescribed  Lacks social connections  Unable to perform ADLs independently  Unable to perform IADLs independently  Currently UNABLE TO independently self manage needs related to chronic health conditions.   Knowledge Deficits related to short  term plan for care coordination needs and long term plans for chronic disease management needs Nurse Case Manager Clinical Goal(s):   Over the next 30 days, patient will work with care management team to address care coordination and chronic disease management needs related to Disease Management  Educational Needs  Care Coordination  Medication Management and Education  Psychosocial Support  Dementia and Caregiver Support Interventions:   Recognize and validate the complex nature of dementia, as well as the impact on the caregiver and extended family; provide education and support to align with needs and stage of dementia.   Acknowledge feelings of grief associated with dementia such as loss of relationship, recreational activities, future with patient and loss associated with out-of-home placement.   Encourage verbalization of feelings without ruminating.   Refer for or provide psychoeducation activities such as cognitive reframing to improve family/caregiver quality of life, wellbeing, confidence, perception of burden, mental health and self-efficacy.   Encourage use of individualized coping strategies that may include yoga, spiritual support, distraction, exercise, relaxation, music, massage, music therapy and outdoor activities to utilize nature's restorative properties.   Provide proactive acknowledgement of potential for depressive symptoms to appear; normalize response to burden of caregiving; refer to primary care provider or for mental health services.   Patient Goals/Self Care Activities:  Over the next 90 days, patient/caregiver will:  - will take all medications exactly as prescribed - keep all scheduled MD appointments - report worsening sign/symptoms of dementia with or without change in behavior - call a family member to talk to for support and or respite care - continue follow up with the embedded BSW to assist with placement and or caregiver resources  - call 911 if  patient becomes aggressive and or if she is fearful of her or the patient's safety   Follow Up Plan: Telephone follow up appointment with care management team member scheduled for: 12/25/20     Problem Identified: Fall Risk   Priority: High    Long-Range Goal: Absence of Fall and Fall-Related Injury   Start Date: 10/15/2020  Expected End Date: 01/13/2021  Recent Progress: On track  Priority: High  Note:   Current Barriers:  [name] Knowledge Deficits related to fall precautions in patient with Type II DM, non-insulin dependent, CKD, stage III, Essential hypertension, Dementia, Parkinsonism, unspecified . Decreased adherence to prescribed treatment for fall prevention . Unable to self administer medications as prescribed . Lacks social connections . Unable to perform ADLs independently . Unable to perform IADLs independently . Chronic Disease Management support and education needs related to Type II DM, non-insulin dependent, CKD, stage III, Essential hypertension, Dementia, Parkinsonism, unspecified . Cognitive Deficits Clinical Goal(s):  [name] Over the next 90 days, patient/caregiver will demonstrate improved adherence to prescribed treatment plan for decreasing falls as evidenced by patient reporting and review of EMR . Over the next 90 days, patient/caregiver will  verbalize using fall risk reduction strategies discussed Interventions:  . Reviewed medications and discussed potential side effects of medications such as dizziness and frequent urination . Assessed for s/s of orthostatic hypotension . Assessed for falls since last encounter. . Assessed patients knowledge of fall risk prevention secondary to previously provided education. . Assessed working status of life alert bracelet and patient adherence . Provided patient information for fall alert systems . Develop a fall prevention plan with the patient and family.  . Assess assistance level required for safe and effective self-care;  consider referral for home care.  . Discussed plans with patient for ongoing care management follow up and provided patient with direct contact information for care management team 10/22/20 completed inbound call with spouse Dylan [name] . Determined patient experienced a fall in his home last evening requiring his spouse to call 911 for assistance . Determined spouse states he leaned sideways while walking down the hall while using his walker and fell into the wall at which time he slid down into the floor gently . Determined per spouse Dylan [name] did not sustain any injuries . Reinforced fall prevention techniques . Sent in basket message to PCP and Neuro to make them aware . Discussed with spouse next Neuro f/u is scheduled for 10/26/20 @ 1:45 PM  . Discussed plans with patient for ongoing care management follow up and provided patient with direct contact information for care management team Patient Goals/Self-Care Activities . Over the next 90 days, patient will:   - Utilize his walker (assistive device) appropriately with all ambulation - De-clutter walkways - Change positions slowly - Wear secure fitting shoes at all times with ambulation - Utilize home lighting for dim lit areas - Demonstrate self and pet awareness at all times - report any/all falls to PCP promptly  Follow Up Plan: Telephone follow up appointment with care management team member scheduled for:  12/25/20      Plan:   Telephone follow up appointment with care management team member scheduled for: 12/25/20  [name], RN, BSN, CCM Care Management Coordinator [facility]  Direct Phone: [phone]

## 2020-11-06 NOTE — Patient Instructions (Signed)
Visit Information  Goals    . Effective Caregiver Coping     Timeframe:  Long-Range Goal Priority:  High Start Date: 10/15/20                            Expected End Date: 01/13/21  Next scheduled follow up  12/25/20                   Over the next 90 days, patient/caregiver will:  - will take all medications exactly as prescribed - keep all scheduled MD appointments - report worsening sign/symptoms of dementia with or without change in behavior - call a family member to talk to for support and or respite care - continue follow up with the embedded BSW to assist with placement and or caregiver resources  - call 911 if patient becomes aggressive and or if she is fearful of her or the patient's safety     . Evaluate and treat abnormal worsening Dementia with behavior changes     Timeframe:  Short-Term Goal Priority:  High Start Date:  10/15/20                           Expected End Date:  10/22/20   Next scheduled follow up date: 12/25/20  Over the next 30 days, patient/caregiver will: - start Nuplazid as directed by Neurology and take all other medications exactly as prescribed - contact the CCM team and or PCP for further concerns or questions  - call 911 for emergencies                         . Manage Urinary Incontinence     Timeframe:  Long-Range Goal Priority:  High Start Date: 10/15/20                            Expected End Date: 11/26/20                      Follow Up Date  12/25/20   - keep skin dry - wear a protective pad or garment  - follow up with [facility] as directed by PCP for evaluation and treatment of elevated PSA - Avoid Alcohol - Caffeine - Carbonated drinks and sparkling water - Artificial sweeteners    Why is this important?    Leaking urine (pee) can cause soreness from skin rashes and redness.   This is caused by skin being exposed to urine (pee).    Notes:     . Work with SW to manage care coordination needs     Timeframe:   Short-Term Goal Priority:  High Start Date:   12.7.21                        Expected End Date:   3.7.22                    Next expected date of contact: 11/25/20   Patient Goals/Self-Care Activities Over the next 30 days, patient will: With the help of [name]  - Patient will self administer medications as prescribed -Patient will attend all scheduled provider appointments -Patient will call provider office for new concerns or questions -Patient will work with BSW to address care coordination needs and will continue to work with  the clinical team to address health care and disease management related needs.   -Follow up with DSS Social Worker Mrs. Hunt        The patient verbalized understanding of instructions, educational materials, and care plan provided today and declined offer to receive copy of patient instructions, educational materials, and care plan.   Telephone follow up appointment with care management team member scheduled for: 12/25/20  Lynne Logan, RN
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
## 2020-11-10 ENCOUNTER — Telehealth: Payer: Self-pay | Admitting: Neurology

## 2020-11-10 ENCOUNTER — Telehealth: Payer: Self-pay

## 2020-11-10 NOTE — Telephone Encounter (Signed)
I called his wife, [name]. Reports she took him off Seroquel completely, and lowered the dose of the Exelon patch to 4.6 mg daily.  Indicates he is much improved, agitation much better, no longer hallucinating.  She never started Nuplazid.  Okay to continue with current plan, let us know of any changes.  Keep follow-up appointment in February.

## 2020-11-10 NOTE — Telephone Encounter (Signed)
I called [name] [name] to schedule the pt an appt for a weekly vitamin b12 injection.

## 2020-11-16 ENCOUNTER — Other Ambulatory Visit: Payer: Medicare HMO | Admitting: Nurse Practitioner

## 2020-11-17 ENCOUNTER — Telehealth: Payer: Self-pay

## 2020-11-17 NOTE — Telephone Encounter (Signed)
I called [name] [name] to schedule the pt an appt for weekly vitamin b12 injections.

## 2020-11-23 ENCOUNTER — Other Ambulatory Visit: Payer: Self-pay

## 2020-11-23 MED ORDER — LORATADINE 10 MG PO TABS: 10.0000 mg | ORAL_TABLET | Freq: Every day | ORAL | 1 refills | Status: DC

## 2020-11-24 ENCOUNTER — Other Ambulatory Visit: Payer: Self-pay

## 2020-11-24 ENCOUNTER — Ambulatory Visit: Payer: Medicare HMO

## 2020-11-24 VITALS — BP 130/70 | HR 77 | Temp 98.7°F | Ht 66.0 in | Wt 167.9 lb

## 2020-11-24 DIAGNOSIS — E538 Deficiency of other specified B group vitamins: Secondary | ICD-10-CM

## 2020-11-24 MED ORDER — CYANOCOBALAMIN 1000 MCG/ML IJ SOLN: 1000.0000 ug | Freq: Once | INTRAMUSCULAR | Status: DC

## 2020-11-24 NOTE — Progress Notes (Signed)
Patient is here today for weekly b12 injection

## 2020-11-25 ENCOUNTER — Ambulatory Visit: Payer: Medicare HMO

## 2020-11-25 ENCOUNTER — Telehealth: Payer: Self-pay

## 2020-11-25 DIAGNOSIS — N1831 Chronic kidney disease, stage 3a: Secondary | ICD-10-CM

## 2020-11-25 DIAGNOSIS — F0391 Unspecified dementia with behavioral disturbance: Secondary | ICD-10-CM

## 2020-11-25 DIAGNOSIS — E1122 Type 2 diabetes mellitus with diabetic chronic kidney disease: Secondary | ICD-10-CM

## 2020-11-25 DIAGNOSIS — N183 Chronic kidney disease, stage 3 unspecified: Secondary | ICD-10-CM

## 2020-11-25 NOTE — Patient Instructions (Signed)
Goals we discussed today:  Goals Addressed            This Visit's Progress   . Work with SW to manage care coordination needs       Timeframe:  Short-Term Goal Priority:  High Start Date:   12.7.21                        Expected End Date:   3.7.22                    Next expected date of contact: 2.9.22    Patient Goals/Self-Care Activities Over the next 30 days, patient will: With the help of [name]  - Patient will self administer medications as prescribed -Patient will attend all scheduled provider appointments -Patient will call provider office for new concerns or questions  -Follow up with DSS Social Worker Mrs. Hunt

## 2020-11-25 NOTE — Telephone Encounter (Signed)
  Chronic Care Management   Outreach Note  11/25/2020 Name: Dylan [name]. MRN: [id number] DOB: 03-06-1941  Referred by: [name], MD Reason for referral : Care Coordination   An unsuccessful telephone outreach was attempted today. The patient was referred to the case management team for assistance with care management and care coordination.   Follow Up Plan: A HIPAA compliant phone message was left for the patient providing contact information and requesting a return call.  The care management team will reach out to the patient again over the next 21 days.   Daneen Schick, BSW, CDP Social Worker, Certified Dementia Practitioner [place] [facility] [place] [facility] [phone]

## 2020-11-25 NOTE — Chronic Care Management (AMB) (Signed)
Chronic Care Management    Social Work Note  11/25/2020 Name: Dylan [name]. MRN: [id number] DOB: [birth year]  Dylan [name]. is a 80 y.o. year old male who is a primary care patient of [name], MD. The CCM team was consulted to assist the patient with chronic disease management and/or care coordination needs related to: [employer]  and Caregiver Stress.   Engaged with Dylan [name] by telephone for follow up visit in response to provider referral for social work chronic care management and care coordination services.   Consent to Services:  The patient was given information about Chronic Care Management services, agreed to services, and gave verbal consent prior to initiation of services.  Please see initial visit note for detailed documentation.   Patient agreed to services and consent obtained.   Assessment: Review of patient past medical history, allergies, medications, and health status, including review of relevant consultants reports was performed today as part of a comprehensive evaluation and provision of chronic care management and care coordination services.     SDOH (Social Determinants of Health) assessments and interventions performed:No    Advanced Directives Status: Not addressed in this encounter.  CCM Care Plan  No Known Allergies  Outpatient Encounter Medications as of 11/25/2020  Medication Sig  . Ascorbic Acid (VITAMIN C) 1000 MG tablet Take 1,000 mg by mouth every morning.   . carbidopa-levodopa (SINEMET IR) 25-100 MG tablet Take 2 tablets by mouth 3 (three) times daily.  . carvedilol (COREG) 12.5 MG tablet TAKE 1 TABLET BY MOUTH 2 TIMES DAILY. (Patient taking differently: Take 12.5 mg by mouth 2 (two) times daily with a meal.)  . Cholecalciferol (VITAMIN D3) 5000 units CAPS Take 5,000 Units by mouth daily with supper.   . COLCRYS 0.6 MG tablet Take 1 tablet (0.6 mg total) by mouth daily.  [name] desonide (DESOWEN) 0.05 % lotion Apply topically  2 times per day prn  . diclofenac Sodium (VOLTAREN) 1 % GEL Apply 2 g topically 3 (three) times daily.  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 Lancet Devices (ONE TOUCH DELICA LANCING DEV) MISC Please fill ONE TOUCH DELICA LANCING DEVICE.  Use to test blood sugar twice daily as directed. E11.65 (Patient taking differently: Please fill ONE TOUCH DELICA LANCING DEVICE.  Use to test blood sugar twice daily as directed. E11.65)  . Lancets MISC Please fill basic/generic push button lancets.  Patient to test blood sugar twice daily. DX: E11.65  . loratadine (CLARITIN) 10 MG tablet Take 1 tablet (10 mg total) by mouth daily.  . Melatonin 10 MG TABS Take 10 mg by mouth at bedtime.   . memantine (NAMENDA) 10 MG tablet TAKE 1 TABLET BY MOUTH TWICE A DAY (Patient taking differently: Take 10 mg by mouth 2 (two) times daily.)  . metFORMIN (GLUCOPHAGE) 1000 MG tablet Take 1 tablet (1,000 mg total) by mouth 2 (two) times daily with a meal.  . Pimavanserin Tartrate (NUPLAZID) 10 MG TABS Take 10 mg by mouth daily.  . pravastatin (PRAVACHOL) 20 MG tablet TAKE 1 TABLET BY ORAL ROUTE EVERY DAY (Patient taking differently: Take 20 mg by mouth daily.)  . QUEtiapine (SEROQUEL) 25 MG tablet TAKE 1 TABLET (25 MG TOTAL) BY MOUTH AT BEDTIME AS NEEDED. (Patient not taking: Reported on 11/04/2020)  . rivastigmine (EXELON) 4.6 mg/24hr Place 1 patch (4.6 mg total) onto the skin daily.  . TRULICITY 1.5 QM/5.7QI SOPN INJECT 1.5 MG  INTO THE SKIN ONCE A WEEK. (Patient taking differently: Inject 1.5 mg into the skin every Monday.)   Facility-Administered Encounter Medications as of 11/25/2020  Medication  . cyanocobalamin ((VITAMIN B-12)) injection 1,000 mcg    Patient Active Problem List   Diagnosis Date Noted  . Encounter for care of pacemaker 07/11/2020  . Pacemaker Dual chamber [place] MRI  model [id number] 07/11/2020 06/08/2020  . Neurogenic  orthostatic hypotension ([place]) 06/08/2020  . DNR (do not resuscitate) 06/08/2020  . Elevated CK 04/27/2020  . Gout   . Hyperlipemia   . Parkinsonism ([place]) 01/29/2020  . 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 Dementia with behavioral disturbance ([place]) 05/22/2019  . Gait abnormality 05/22/2019  . AKI (acute kidney injury) ([place]) 07/14/2018  . Nephropathy 07/14/2018  . Other disorders of lung 07/14/2018  . Atherosclerosis of aorta ([place]) 07/14/2018  . Dehydration 07/14/2018  . 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 CKD (chronic kidney disease), stage III ([place]) 06/13/2018  . Syncope 06/13/2018  . 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[place]) 06/23/2015  . Peripheral arterial disease ([place]) 05/13/2014  . Essential hypertension 05/13/2014  . Type 2 diabetes mellitus with stage 3a chronic kidney disease, without long-term current use of insulin ([place]) 05/13/2014    Conditions to be addressed/monitored: DMII, CKD Stage III and Dementia; Memory Deficits  Care Plan : Social Work Care Plan  Updates made by Daneen Schick since 11/25/2020 12:00 AM    Problem: Care Coordination   Priority: Medium    Long-Range Goal: Caregiver Respite Resources   Start Date: 10/06/2020  Expected End Date: 01/04/2021  Recent Progress: On track  Priority: High  Note:   Current Barriers:  . Financial constraints related to cost of caregiver . Level of care concerns . Memory Deficits . Inability to perform ADL's independently . Inability to perform IADL's independently . Chronic conditions including DM II, CKD III, Parkinsonism, and Dementia which put patient at increased risk of hospitalization  Social Work Clinical Goal(s):  [name] Over the next 90 days, patient will work with SW to address concerns related to caregiver resources  SW Interventions: Completed 1.26.22 . 1:1 collaboration with [name], MD regarding development and update of comprehensive plan of  care as evidenced by provider attestation and co-signature . Inter-disciplinary care team collaboration (see longitudinal plan of care) . Received inbound call from the patients spouse/caregiver Dylan [name] . Discussed the patient continues to do well in the home with [name] being sole caregiver . Determined [name] has yet to hear back from caseworker Phillips Hay . Discussed [name] would like the patient to attend and adult day program but is awaiting to determine if the patient is eligible for funding . Encouraged [name] to contact to supervisor of the program  . Scheduled follow up call to [name] over the next two weeks Past Intervention: . 1:1 collaboration with [name], MD regarding development and update of comprehensive plan of care as evidenced by provider attestation and co-signature . Inter-disciplinary care team collaboration (see longitudinal plan of care) . Successful outbound call placed to Dylan [name] to follow up on patient goal progression . Discussed [name] has been in contact with DSS social worker Mrs. Hunt to discuss patient care needs - [name] has lost Mrs. Hunts contact number and is unable to follow up with her . Provided [name] with appropriate contact number to DSS Social Worker Phillips Hay [phone] .  Scheduled follow up call over the next month  Patient Goals/Self-Care Activities Over the next 30 days, patient will: With the help of Dylan [name]  - Patient will self administer medications as prescribed -Patient will attend all scheduled provider appointments -Patient will call provider office for new concerns or questions  -Follow up with DSS Social Worker Mrs. Hunt  Follow up Plan:  SW will follow up with the patient over the next two weeks       Follow Up Plan: SW will follow up with patient by phone over the next two weeks.      Daneen Schick, BSW, CDP Social Worker, Certified Dementia  Practitioner [facility] [phone]  Total time spent performing care coordination and/or care management activities with the patient by phone or face to face = 15 minutes.

## 2020-11-26 ENCOUNTER — Telehealth: Payer: Self-pay | Admitting: Podiatry

## 2020-11-26 NOTE — Telephone Encounter (Signed)
A male person left message with same number as Dylan [name] his fiance stating pt would like to get some tennis shoes

## 2020-11-26 NOTE — Telephone Encounter (Signed)
Upon checking chart your last note does not show pt qualified for diabetic shoes. It only indicates pt is diabetic with no other foot conditions. Does pt qualify and can I call and get them scheduled to be measured if so.

## 2020-12-01 ENCOUNTER — Ambulatory Visit (INDEPENDENT_AMBULATORY_CARE_PROVIDER_SITE_OTHER): Payer: Medicare HMO

## 2020-12-01 ENCOUNTER — Telehealth: Payer: Self-pay | Admitting: Nurse Practitioner

## 2020-12-01 ENCOUNTER — Other Ambulatory Visit: Payer: Self-pay

## 2020-12-01 ENCOUNTER — Other Ambulatory Visit: Payer: Self-pay | Admitting: Internal Medicine

## 2020-12-01 VITALS — BP 115/75 | HR 71 | Ht 66.0 in | Wt 168.0 lb

## 2020-12-01 DIAGNOSIS — E538 Deficiency of other specified B group vitamins: Secondary | ICD-10-CM

## 2020-12-01 MED ORDER — CYANOCOBALAMIN 1000 MCG/ML IJ SOLN
1000.0000 ug | Freq: Once | INTRAMUSCULAR | Status: AC
  Administered 2020-12-01: 1000 ug via INTRAMUSCULAR

## 2020-12-01 NOTE — Patient Instructions (Signed)
Vitamin B12 Deficiency Vitamin B12 deficiency occurs when the body does not have enough vitamin B12, which is an important vitamin. The body needs this vitamin:  To make red blood cells.  To make DNA. This is the genetic material inside cells.  To help the nerves work properly so they can carry messages from the brain to the body. Vitamin B12 deficiency can cause various health problems, such as a low red blood cell count (anemia) or nerve damage. What are the causes? This condition may be caused by:  Not eating enough foods that contain vitamin B12.  Not having enough stomach acid and digestive fluids to properly absorb vitamin B12 from the food that you eat.  Certain digestive system diseases that make it hard to absorb vitamin B12. These diseases include Crohn's disease, chronic pancreatitis, and cystic fibrosis.  A condition in which the body does not make enough of a protein (intrinsic factor), resulting in too few red blood cells (pernicious anemia).  Having a surgery in which part of the stomach or small intestine is removed.  Taking certain medicines that make it hard for the body to absorb vitamin B12. These medicines include: ? Heartburn medicines (antacids and proton pump inhibitors). ? Certain antibiotic medicines. ? Some medicines that are used to treat diabetes, tuberculosis, gout, or high cholesterol. What increases the risk? The following factors may make you more likely to develop a B12 deficiency:  Being older than age 50.  Eating a vegetarian or vegan diet, especially while you are pregnant.  Eating a poor diet while you are pregnant.  Taking certain medicines.  Having alcoholism. What are the signs or symptoms? In some cases, there are no symptoms of this condition. If the condition leads to anemia or nerve damage, various symptoms can occur, such as:  Weakness.  Fatigue.  Loss of appetite.  Weight loss.  Numbness or tingling in your hands and  feet.  Redness and burning of the tongue.  Confusion or memory problems.  Depression.  Sensory problems, such as color blindness, ringing in the ears, or loss of taste.  Diarrhea or constipation.  Trouble walking. If anemia is severe, symptoms can include:  Shortness of breath.  Dizziness.  Rapid heart rate (tachycardia). How is this diagnosed? This condition may be diagnosed with a blood test to measure the level of vitamin B12 in your blood. You may also have other tests, including:  A group of tests that measure certain characteristics of blood cells (complete blood count, CBC).  A blood test to measure intrinsic factor.  A procedure where a thin tube with a camera on the end is used to look into your stomach or intestines (endoscopy). Other tests may be needed to discover the cause of B12 deficiency. How is this treated? Treatment for this condition depends on the cause. This condition may be treated by:  Changing your eating and drinking habits, such as: ? Eating more foods that contain vitamin B12. ? Drinking less alcohol or no alcohol.  Getting vitamin B12 injections.  Taking vitamin B12 supplements. Your health care provider will tell you which dosage is best for you. Follow these instructions at home: Eating and drinking  Eat lots of healthy foods that contain vitamin B12, including: ? Meats and poultry. This includes beef, pork, chicken, turkey, and organ meats, such as liver. ? Seafood. This includes clams, rainbow trout, salmon, tuna, and haddock. ? Eggs. ? Cereal and dairy products that are fortified. This means that vitamin B12 has   been added to the food. Check the label on the package to see if the food is fortified. The items listed above may not be a complete list of recommended foods and beverages. Contact a dietitian for more information.   General instructions  Get any injections that are prescribed by your health care provider.  Take  supplements only as told by your health care provider. Follow the directions carefully.  Do not drink alcohol if your health care provider tells you not to. In some cases, you may only be asked to limit alcohol use.  Keep all follow-up visits as told by your health care provider. This is important. Contact a health care provider if:  Your symptoms come back. Get help right away if you:  Develop shortness of breath.  Have a rapid heart rate.  Have chest pain.  Become dizzy or lose consciousness. Summary  Vitamin B12 deficiency occurs when the body does not have enough vitamin B12.  The main causes of vitamin B12 deficiency include dietary deficiency, digestive diseases, pernicious anemia, and having a surgery in which part of the stomach or small intestine is removed.  In some cases, there are no symptoms of this condition. If the condition leads to anemia or nerve damage, various symptoms can occur, such as weakness, shortness of breath, and numbness.  Treatment may include getting vitamin B12 injections or taking vitamin B12 supplements. Eat lots of healthy foods that contain vitamin B12. This information is not intended to replace advice given to you by your health care provider. Make sure you discuss any questions you have with your health care provider. Document Revised: 04/05/2019 Document Reviewed: 06/26/2018 Elsevier Patient Education  2021 Elsevier Inc.  

## 2020-12-01 NOTE — Telephone Encounter (Signed)
Spoke with [name] to see if it was okay if we rescheduled the 12/07/20 Palliative f/u visit to 12/15/20 @ 11:30 AM and she was in agreement with this.

## 2020-12-03 NOTE — Telephone Encounter (Signed)
Please call wife and let her know I will reassess him on March's visit. Can you schedule him for March 15th around same time he sees me? He sees me at 2:45 pm on March 15th. Thanks.

## 2020-12-03 NOTE — Telephone Encounter (Signed)
Left message for pt that we have him schedule to see Liliane Channel on the same day as his appt on 3.15 if he qualifies for the shoes.

## 2020-12-03 NOTE — Progress Notes (Signed)
I have reviewed and agreed above plan. 

## 2020-12-07 ENCOUNTER — Other Ambulatory Visit: Payer: Medicare HMO | Admitting: Nurse Practitioner

## 2020-12-08 ENCOUNTER — Ambulatory Visit: Payer: Medicare HMO

## 2020-12-08 ENCOUNTER — Other Ambulatory Visit: Payer: Self-pay

## 2020-12-08 VITALS — BP 110/75 | HR 98 | Temp 98.0°F | Ht 66.0 in | Wt 168.0 lb

## 2020-12-08 DIAGNOSIS — E538 Deficiency of other specified B group vitamins: Secondary | ICD-10-CM

## 2020-12-08 MED ORDER — CYANOCOBALAMIN 1000 MCG/ML IJ SOLN
1000.0000 ug | Freq: Once | INTRAMUSCULAR | Status: AC
  Administered 2020-12-22: 1000 ug via INTRAMUSCULAR

## 2020-12-08 NOTE — Progress Notes (Signed)
Pt was here to receive weekly injection.

## 2020-12-09 ENCOUNTER — Telehealth: Payer: Self-pay

## 2020-12-09 ENCOUNTER — Telehealth: Payer: Medicare HMO

## 2020-12-09 NOTE — Telephone Encounter (Signed)
  Chronic Care Management   Outreach Note  12/09/2020 Name: Dylan [name]. MRN: [id number] DOB: 06/17/1941  Referred by: [name], MD Reason for referral : Care Coordination   An unsuccessful telephone outreach was attempted today. The patient was referred to the case management team for assistance with care management and care coordination.     Follow Up Plan: A HIPAA compliant phone message was left for the patient providing contact information and requesting a return call.  The care management team will reach out to the patient again over the next 21 days.   Daneen Schick, BSW, CDP Social Worker, Certified Dementia Practitioner [facility] [phone]

## 2020-12-15 ENCOUNTER — Ambulatory Visit: Payer: Medicare HMO

## 2020-12-15 ENCOUNTER — Ambulatory Visit (INDEPENDENT_AMBULATORY_CARE_PROVIDER_SITE_OTHER): Payer: Medicare HMO

## 2020-12-15 ENCOUNTER — Other Ambulatory Visit: Payer: Medicare HMO | Admitting: Nurse Practitioner

## 2020-12-15 ENCOUNTER — Encounter: Payer: Self-pay | Admitting: Nurse Practitioner

## 2020-12-15 ENCOUNTER — Other Ambulatory Visit: Payer: Self-pay

## 2020-12-15 VITALS — BP 116/76 | HR 80 | Temp 98.1°F | Ht 66.0 in | Wt 168.0 lb

## 2020-12-15 DIAGNOSIS — G20 Parkinson's disease: Secondary | ICD-10-CM

## 2020-12-15 DIAGNOSIS — F0391 Unspecified dementia with behavioral disturbance: Secondary | ICD-10-CM

## 2020-12-15 DIAGNOSIS — E538 Deficiency of other specified B group vitamins: Secondary | ICD-10-CM

## 2020-12-15 DIAGNOSIS — Z515 Encounter for palliative care: Secondary | ICD-10-CM

## 2020-12-15 MED ORDER — CYANOCOBALAMIN 1000 MCG/ML IJ SOLN
1000.0000 ug | Freq: Once | INTRAMUSCULAR | Status: AC
  Administered 2020-12-15: 1000 ug via INTRAMUSCULAR

## 2020-12-15 NOTE — Progress Notes (Signed)
Patient is here for b12 injection.  

## 2020-12-15 NOTE — Progress Notes (Signed)
[facility] Consult Note Telephone: [phone]  Fax: [phone]  PATIENT NAME: Dylan [name]. DOB: [birth year] MRN: [id number]  PRIMARY CARE PROVIDER:   [name], MD  REFERRING PROVIDER:  [name], [address] [place],  [place] [address]  RESPONSIBLE PARTY:   Self; caregiver Ms.[name]  RECOMMENDATIONS and PLAN: 1.ACP:DNR and MOST form in [place]. Wishes are for Limited interventions, no feeding tube but wishes are for antibiotic, IVF  2.Palliative care encounter; Palliative medicine team will continue to support patient, patient's family, and medical team. Visit consisted of counseling and education dealing with the complex and emotionally intense issues of symptom management and palliative care in the setting of serious and potentially life-threatening illness  3. F/u visit 2 months if needed or sooner if declines for monitoring for decline, weight with Parkinson, dementia  I spent 60 minutes providing this consultation,  from 11:30am to 12:30pm. More than 50% of the time in this consultation was spent coordinating communication.   HISTORY OF PRESENT ILLNESS:  Larrell Rapozo. is a 80 y.o. year old male with multiple medical problems including Parkinson disease, dementia, Aortic stenosis, complete heart block s/p pacemaker, peripheral vascular disease, hypertension, diabetes, neuropathy, chronic kidney disease, gout, vitamin b deficiency,vitamin D deficiency, anxiety, lipoma sgy, kyphoplasty, eye sgy.  In-home palliative care follow-up visit for Mr. Ciavarella with his partner Ms Arsenio Loader. I called Ms Arsenio Loader prior to visit to confirm Palliative care visit and covid screening which was negative. Upon arrival Mr Seavey was sitting in the wheelchair in the kitchen. Mr. Snoke and I talked about purpose of Palliative care visit, in agreement. We talked about how Mr. Noda is feeling today. Mr.  Mchargue endorses he is doing well. No noted weight loss. Denied symptoms of pain, shortness of breath. Ms Arsenio Loader endorses Mr. Carothers is becoming more stiff she does try to exercise butt would like physical therapy to reassess. Discuss recommended Ms Arsenio Loader discuss with primary of which she has an appointment today for referral for physical therapy in home. We talked about PACE program. Ms Arsenio Loader endorses when she did call he would not qualify for PACE but it was recommended for [facility]. Ms Arsenio Loader endorses she has a call placed into the social worker but has not heard back from her and will continue to try to reach her. Ms Arsenio Loader endorses she would like for Mr. Doolan to go to a daycare two days a week. Aside from that she is able to manage him at home. Ms Arsenio Loader endorses a recent fall a few days ago. No noted injury, Mr. Dettmann slid to the floor, Ms Arsenio Loader endorses technically it was not a fall. We talked about Mr. Aspinall functional level as he is total ADL dependence including lifting, bathing, dressing. Mr. Samons does continue to have incontinent episodes. Mr Corcoran does feed himself. Appetite has been good no new weight loss. We talked about medical goals of care, reviewed. We talked about Medicaid application. We talked about [place], [place] caseworker, Ms Arsenio Loader will call to see if he would be eligible for Medicaid, proceed with completing application. We talked about services that Medicaid does provide should he be eligible. Ms Arsenio Loader endorses she did hire a caregiver to come in to help a couple days a week but it didn't work out as she did not perform the tasks as Ms Arsenio Loader wished. [name] endorses she had to let her go.  We talked about caregiver stress and fatigue, coping strategies, self care. Ms Arsenio Loader endorses she has previously injured her back caring for Mr. Guillet. We stressed importance of safety. We talked about role of Palliative  care and plan of care. We talked about follow-up visit in 2 months if needed or sooner should he declined. Ms Arsenio Loader in agreement, appointment scheduled. Therapeutic listening and emotional support provided. Praised Ms Arsenio Loader for giving Mr. Podgorski excellent care. Questions answered the satisfaction. Contact information provided.  122/70 Left arm 24 cm Left leg 40 cm  Palliative Care was asked to help to continue to address goals of care.   CODE STATUS: DNR  PPS: 30% HOSPICE ELIGIBILITY/DIAGNOSIS: TBD  PAST MEDICAL HISTORY:  Past Medical History:  Diagnosis Date  . Anxiety   . Aortic stenosis    mild AS 11/24/16 (peak grad 24, mean grad 11) Dr. [name]  . CHB (complete heart block) ([place]) 07/2017  . 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[place]) 07/11/2017  . Diabetes mellitus without complication ([place])   . Encounter for care of pacemaker 07/11/2020  . Gait abnormality   . Gout   . Hyperlipemia   . Hypertension   . 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 Memory loss   . Pacemaker Dual chamber [place] MRI  model [id number] 07/11/2020 06/08/2020  . Peripheral arterial disease ([place])   . 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 Second degree AV block    [place]enckebach; no indication for pacemaker as of 12/01/16 (Dr. [name])  . Vitamin B12 deficiency anemia 03/01/2018  . Vitamin D deficiency disease     SOCIAL HX:  Social History   Tobacco Use  . Smoking status: Former Smoker    Packs/day: 0.50    Years: 7.00    Pack years: 3.50    Types: Cigarettes  . Smokeless tobacco: Never Used  . Tobacco comment: quit 35 years  Substance Use Topics  . Alcohol use: No    ALLERGIES: No Known Allergies   PERTINENT MEDICATIONS:  Outpatient Encounter Medications as of 12/15/2020  Medication Sig  . Ascorbic Acid (VITAMIN C) 1000 MG tablet Take 1,000 mg by mouth every morning.   . carbidopa-levodopa (SINEMET IR) 25-100 MG tablet  Take 2 tablets by mouth 3 (three) times daily.  . carvedilol (COREG) 12.5 MG tablet TAKE 1 TABLET BY MOUTH 2 TIMES DAILY. (Patient taking differently: Take 12.5 mg by mouth 2 (two) times daily with a meal.)  . Cholecalciferol (VITAMIN D3) 5000 units CAPS Take 5,000 Units by mouth daily with supper.   . COLCRYS 0.6 MG tablet Take 1 tablet (0.6 mg total) by mouth daily.  [name] desonide (DESOWEN) 0.05 % lotion Apply topically 2 times per day prn  . diclofenac Sodium (VOLTAREN) 1 % GEL Apply 2 g topically 3 (three) times daily.  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 Lancet Devices (ONE TOUCH DELICA LANCING DEV) MISC Please fill ONE TOUCH DELICA LANCING DEVICE.  Use to test blood sugar twice daily as directed. E11.65 (Patient taking differently: Please fill ONE TOUCH DELICA LANCING DEVICE.  Use to test blood sugar twice daily as directed. E11.65)  . Lancets MISC Please fill basic/generic push button lancets.  Patient to test blood sugar twice daily. DX: E11.65  . loratadine (CLARITIN) 10 MG tablet Take 1 tablet (10 mg total) by mouth daily.  . Melatonin  10 MG TABS Take 10 mg by mouth at bedtime.   . memantine (NAMENDA) 10 MG tablet TAKE 1 TABLET BY MOUTH TWICE A DAY (Patient taking differently: Take 10 mg by mouth 2 (two) times daily.)  . metFORMIN (GLUCOPHAGE) 1000 MG tablet Take 1 tablet (1,000 mg total) by mouth 2 (two) times daily with a meal.  . Pimavanserin Tartrate (NUPLAZID) 10 MG TABS Take 10 mg by mouth daily.  . pravastatin (PRAVACHOL) 20 MG tablet TAKE 1 TABLET BY ORAL ROUTE EVERY DAY  . QUEtiapine (SEROQUEL) 25 MG tablet TAKE 1 TABLET (25 MG TOTAL) BY MOUTH AT BEDTIME AS NEEDED. (Patient not taking: No sig reported)  . rivastigmine (EXELON) 4.6 mg/24hr Place 1 patch (4.6 mg total) onto the skin daily.  . TRULICITY 1.5 XN/2.3FT SOPN INJECT 1.5 MG INTO THE SKIN ONCE A WEEK. (Patient taking differently: Inject 1.5 mg into the skin  every Monday.)   Facility-Administered Encounter Medications as of 12/15/2020  Medication  . cyanocobalamin ((VITAMIN B-12)) injection 1,000 mcg  . [COMPLETED] cyanocobalamin ((VITAMIN B-12)) injection 1,000 mcg    PHYSICAL EXAM:   General: debilitated, male Cardiovascular: regular rate and rhythm Pulmonary: clear ant fields Abdomen: soft, nontender, + bowel sounds Neurological: w/c dependent,  Lebaron Bautch Z Maisee Vollman, NP

## 2020-12-17 ENCOUNTER — Ambulatory Visit (INDEPENDENT_AMBULATORY_CARE_PROVIDER_SITE_OTHER): Payer: Medicare HMO

## 2020-12-17 ENCOUNTER — Telehealth: Payer: Medicare HMO

## 2020-12-17 DIAGNOSIS — N183 Chronic kidney disease, stage 3 unspecified: Secondary | ICD-10-CM

## 2020-12-17 DIAGNOSIS — I10 Essential (primary) hypertension: Secondary | ICD-10-CM

## 2020-12-17 DIAGNOSIS — N1831 Chronic kidney disease, stage 3a: Secondary | ICD-10-CM

## 2020-12-17 DIAGNOSIS — G20 Parkinson's disease: Secondary | ICD-10-CM

## 2020-12-17 DIAGNOSIS — F0391 Unspecified dementia with behavioral disturbance: Secondary | ICD-10-CM

## 2020-12-17 DIAGNOSIS — E1122 Type 2 diabetes mellitus with diabetic chronic kidney disease: Secondary | ICD-10-CM

## 2020-12-18 ENCOUNTER — Telehealth: Payer: Self-pay

## 2020-12-18 NOTE — Telephone Encounter (Signed)
Patient called questioning how he is suppose to be taking his metformin because last ov you told him to take only 1 daily but his PCP refilled it for BID patient is just wondering how he needs to take it please advise

## 2020-12-18 NOTE — Telephone Encounter (Signed)
Patient didn't answer left a vm will try again later

## 2020-12-18 NOTE — Telephone Encounter (Signed)
BID then

## 2020-12-19 MED ORDER — LINACLOTIDE 72 MCG PO CAPS: 72.0000 ug | ORAL_CAPSULE | Freq: Every day | ORAL | 0 refills | Status: AC

## 2020-12-21 ENCOUNTER — Other Ambulatory Visit: Payer: Self-pay

## 2020-12-21 ENCOUNTER — Ambulatory Visit: Payer: Medicare HMO

## 2020-12-21 ENCOUNTER — Other Ambulatory Visit: Payer: Self-pay | Admitting: Urology

## 2020-12-21 ENCOUNTER — Telehealth: Payer: Self-pay

## 2020-12-21 DIAGNOSIS — G20 Parkinson's disease: Secondary | ICD-10-CM

## 2020-12-21 DIAGNOSIS — I10 Essential (primary) hypertension: Secondary | ICD-10-CM

## 2020-12-21 DIAGNOSIS — E1122 Type 2 diabetes mellitus with diabetic chronic kidney disease: Secondary | ICD-10-CM

## 2020-12-21 DIAGNOSIS — N183 Chronic kidney disease, stage 3 unspecified: Secondary | ICD-10-CM

## 2020-12-21 DIAGNOSIS — G20A1 Parkinson's disease without dyskinesia, without mention of fluctuations: Secondary | ICD-10-CM

## 2020-12-21 DIAGNOSIS — N1831 Chronic kidney disease, stage 3a: Secondary | ICD-10-CM | POA: Diagnosis not present

## 2020-12-21 DIAGNOSIS — F0391 Unspecified dementia with behavioral disturbance: Secondary | ICD-10-CM

## 2020-12-21 DIAGNOSIS — C61 Malignant neoplasm of prostate: Secondary | ICD-10-CM

## 2020-12-21 MED ORDER — METFORMIN HCL 1000 MG PO TABS: 1000.0000 mg | ORAL_TABLET | ORAL | 1 refills | Status: DC

## 2020-12-21 NOTE — Telephone Encounter (Signed)
Called and spoke with patients wife regarding metformin BID. Pt voiced understanding.

## 2020-12-21 NOTE — Telephone Encounter (Signed)
Dylan Chard, MD  Dylan [name], Dylan [name] Please contact Rudie Meyer (wife [name]). Dr. [name] has informed pt to take metformin once daily. Please notify them that is okay. Be sure to resend rx to the pharmacy with once daily instructions. Thank you.

## 2020-12-21 NOTE — Chronic Care Management (AMB) (Signed)
Chronic Care Management    Social Work Note  12/21/2020 Name: Dylan [name]. MRN: [id number] DOB: 1940-11-30  Dylan [name]. is a 80 y.o. year old male who is a primary care patient of [name], MD. The CCM team was consulted to assist the patient with chronic disease management and/or care coordination needs related to: [employer] .   Engaged with patient caregiver Dylan [name] by phone for follow up visit in response to provider referral for social work chronic care management and care coordination services.   Consent to Services:  The patient was given information about Chronic Care Management services, agreed to services, and gave verbal consent prior to initiation of services.  Please see initial visit note for detailed documentation.   Patient agreed to services and consent obtained.   Assessment: Review of patient past medical history, allergies, medications, and health status, including review of relevant consultants reports was performed today as part of a comprehensive evaluation and provision of chronic care management and care coordination services.     SDOH (Social Determinants of Health) assessments and interventions performed:    Advanced Directives Status: Not addressed in this encounter.  CCM Care Plan  No Known Allergies  Outpatient Encounter Medications as of 12/21/2020  Medication Sig  . Ascorbic Acid (VITAMIN C) 1000 MG tablet Take 1,000 mg by mouth every morning.   . carbidopa-levodopa (SINEMET IR) 25-100 MG tablet Take 2 tablets by mouth 3 (three) times daily.  . carvedilol (COREG) 12.5 MG tablet TAKE 1 TABLET BY MOUTH 2 TIMES DAILY. (Patient taking differently: Take 12.5 mg by mouth 2 (two) times daily with a meal.)  . Cholecalciferol (VITAMIN D3) 5000 units CAPS Take 5,000 Units by mouth daily with supper.   . COLCRYS 0.6 MG tablet Take 1 tablet (0.6 mg total) by mouth daily.  [name] desonide (DESOWEN) 0.05 % lotion Apply topically 2 times  per day prn  . diclofenac Sodium (VOLTAREN) 1 % GEL Apply 2 g topically 3 (three) times daily.  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 Lancet Devices (ONE TOUCH DELICA LANCING DEV) MISC Please fill ONE TOUCH DELICA LANCING DEVICE.  Use to test blood sugar twice daily as directed. E11.65 (Patient taking differently: Please fill ONE TOUCH DELICA LANCING DEVICE.  Use to test blood sugar twice daily as directed. E11.65)  . Lancets MISC Please fill basic/generic push button lancets.  Patient to test blood sugar twice daily. DX: E11.65  . linaclotide (LINZESS) 72 MCG capsule Take 1 capsule (72 mcg total) by mouth daily before breakfast.  . loratadine (CLARITIN) 10 MG tablet Take 1 tablet (10 mg total) by mouth daily.  . Melatonin 10 MG TABS Take 10 mg by mouth at bedtime.   . memantine (NAMENDA) 10 MG tablet TAKE 1 TABLET BY MOUTH TWICE A DAY (Patient taking differently: Take 10 mg by mouth 2 (two) times daily.)  . metFORMIN (GLUCOPHAGE) 1000 MG tablet Take 1 tablet (1,000 mg total) by mouth 1 day or 1 dose for 1 dose.  [name] Pimavanserin Tartrate (NUPLAZID) 10 MG TABS Take 10 mg by mouth daily.  . pravastatin (PRAVACHOL) 20 MG tablet TAKE 1 TABLET BY ORAL ROUTE EVERY DAY  . QUEtiapine (SEROQUEL) 25 MG tablet TAKE 1 TABLET (25 MG TOTAL) BY MOUTH AT BEDTIME AS NEEDED. (Patient not taking: No sig reported)  . rivastigmine (EXELON) 4.6 mg/24hr Place 1 patch (4.6 mg total) onto the skin daily.  [name]  TRULICITY 1.5 UX/3.2GM SOPN INJECT 1.5 MG INTO THE SKIN ONCE A WEEK. (Patient taking differently: Inject 1.5 mg into the skin every Monday.)   Facility-Administered Encounter Medications as of 12/21/2020  Medication  . cyanocobalamin ((VITAMIN B-12)) injection 1,000 mcg    Patient Active Problem List   Diagnosis Date Noted  . Encounter for care of pacemaker 07/11/2020  . 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 Neurogenic orthostatic hypotension ([place]) 06/08/2020  . DNR (do not resuscitate) 06/08/2020  . Elevated CK 04/27/2020  . Gout   . Hyperlipemia   . 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 Dementia with behavioral disturbance ([place]) 05/22/2019  . Gait abnormality 05/22/2019  . AKI (acute kidney injury) ([place]) 07/14/2018  . Nephropathy 07/14/2018  . Other disorders of lung 07/14/2018  . Atherosclerosis of aorta ([place]) 07/14/2018  . Dehydration 07/14/2018  . 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 CKD (chronic kidney disease), stage III ([place]) 06/13/2018  . Syncope 06/13/2018  . 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 Peripheral arterial disease ([place]) 05/13/2014  . Essential hypertension 05/13/2014  . Type 2 diabetes mellitus with stage 3a chronic kidney disease, without long-term current use of insulin ([place]) 05/13/2014    Conditions to be addressed/monitored: DMII, CKD Stage III, Dementia and Parkinsons; ADL IADL limitations and care coordination  Care Plan : Social Work Care Plan  Updates made by Daneen Schick since 12/21/2020 12:00 AM    Problem: Care Coordination Deleted 12/21/2020  Priority: Medium    Long-Range Goal: Caregiver Respite Resources Completed 12/21/2020  Start Date: 10/06/2020  Expected End Date: 01/04/2021  Recent Progress: On track  Priority: High  Note:   Current Barriers:  . Financial constraints related to cost of caregiver . Level of care concerns . Memory Deficits . Inability to perform ADL's independently . Inability to perform IADL's independently . Chronic conditions including DM II, CKD III, Parkinsonism, and Dementia which put patient at increased risk of hospitalization  Social Work Clinical Goal(s):  [name] Over the next 90 days, patient will work with SW to address concerns related to caregiver resources  SW Interventions: Completed  2.21.22 . 1:1 collaboration with [name], MD regarding development and update of comprehensive plan of care as evidenced by provider attestation and co-signature . Inter-disciplinary care team collaboration (see longitudinal plan of care) . Successful outbound call placed to the patients spouse to assess goal progression . Discussed the patient has recently been diagnosed with prostate cancer . [name] is no longer interested in pursuing funding for the patient to participate in an adult day program . Goal closed  Patient Goals/Self-Care Activities Over the next 30 days, patient will: With the help of Dylan [name]  - Patient will self administer medications as prescribed -Patient will attend all scheduled provider appointments -Patient will call provider office for new concerns or questions       Problem: Disease Progression     Long-Range Goal: Disease Progression Managed with Care Coordination   Start Date: 12/21/2020  Expected End Date: 04/20/2021  This Visit's Progress: On track  Priority: High  Note:   Current Barriers:  . Level of care concerns . Recent Diagnosis of Prostate Cancer  . Chronic conditions including DM II, CKD III, Dementia, and Parkinson's disease which put patient at increased risk of hospitaliation . Unable to perform ADLs independently . Unable to perform  IADLs independently  Social Work Clinical Goal(s):  [name] Over the next 120 days the patient and his spouse, Dylan [name], will work with SW to address care coordination needs related to disease progression  Interventions: . 1:1 collaboration with [name], MD regarding development and update of comprehensive plan of care as evidenced by provider attestation and co-signature . Inter-disciplinary care team collaboration (see longitudinal plan of care) . Successful outbound call placed to Dylan [name] to assess for care coordination needs . Discussed the patient was recently diagnosed  with prostate cancer - due to the patients age and comorbid conditions the patient will not receive treatment for newly diagnosed cancer . Determined [name] has noticed a physical decline in the patient ability to ambulate - patient using walker inappropriately by pushing too far forward, patient walking sideways a lot rather than forward . [name] is requesting an order for physical therapy be sent to [facility] . Performed chart review to note patient last seen by PCP on 1.4.22 . Advised [name] the patient may need to be seen by provider prior to sending in an order for home health . Collaboration with [name] to determine if a face to face is needed prior to placing referral . Performed chart review to note the patient is currently active with Palliative Care . Provided education on the difference in Palliative Care and Hospice Care . Encouraged [name] to notify patients care team if she begins to notice declines and would like a referral for hospice . Advised [name] the patient would not be able to participate in home health services while under hospice care - at this time [name] would rather pursue home health PT for the patient . Assessed for DME needs in the home - [name] declines needs at this time. The patient has a walker, wheelchair, and hospital bed . Collaboration with [place] to inform of interventions and plan . Scheduled follow up call over the next month  Patient Goals/Self-Care Activities Over the next 30 days, patient will: With the help of his caregiver Dylan [name]  - Patient will self administer medications as prescribed Patient will attend all scheduled provider appointments Patient will call provider office for new concerns or questions Contact SW as needed prior to next scheduled call  Follow up Plan: SW will follow up with patient by phone over the next month       Follow Up Plan: SW will follow up  with patient by phone over the next month.      Daneen Schick, BSW, CDP Social Worker, Certified Dementia Practitioner [place] [facility] [place] [facility] [phone]  Total time spent performing care coordination and/or care management activities with the patient by phone or face to face = 57 minutes.

## 2020-12-21 NOTE — Telephone Encounter (Signed)
Dylan [name] was told that the pt is to take 1 tab daily of the metformin. Despite being to take 1 per day, Dylan. [name] said she is still going to give the pt 1 pill 2 times per day.

## 2020-12-21 NOTE — Patient Instructions (Signed)
Goals we discussed today:  Goals Addressed            This Visit's Progress   . Disease Progression Managed with Care Coordination       Timeframe:  Long-Range Goal Priority:  High Start Date:  2.21.22                           Expected End Date: 6.21.22                       Next planned outreach: 3.21.22  Patient Goals/Self-Care Activities Over the next 30 days, patient will: With the help of his caregiver [name]  - Patient will self administer medications as prescribed Patient will attend all scheduled provider appointments Patient will call provider office for new concerns or questions Contact SW as needed prior to next scheduled call    . COMPLETED: Work with SW to manage care coordination needs       Timeframe:  Short-Term Goal Priority:  High Start Date:   12.7.21                        Expected End Date:   3.7.22                    2.21.22- Goal closed; [name] no longer interested in pursuing funding to assist with the cost of an adult day program   Patient Goals/Self-Care Activities Over the next 30 days, patient will: With the help of [name]  - Patient will self administer medications as prescribed -Patient will attend all scheduled provider appointments -Patient will call provider office for new concerns or questions

## 2020-12-22 ENCOUNTER — Other Ambulatory Visit: Payer: Self-pay

## 2020-12-22 ENCOUNTER — Ambulatory Visit (INDEPENDENT_AMBULATORY_CARE_PROVIDER_SITE_OTHER): Payer: Medicare HMO

## 2020-12-22 VITALS — BP 118/74 | HR 98 | Wt 168.0 lb

## 2020-12-22 DIAGNOSIS — E538 Deficiency of other specified B group vitamins: Secondary | ICD-10-CM | POA: Diagnosis not present

## 2020-12-22 MED ORDER — CYANOCOBALAMIN 1000 MCG/ML IJ SOLN
1000.0000 ug | Freq: Once | INTRAMUSCULAR | Status: AC
  Administered 2021-01-01: 1000 ug via INTRAMUSCULAR

## 2020-12-22 NOTE — Patient Instructions (Signed)
Goals Addressed    . Behavior Symptoms Managed   On track    Timeframe:  Long-Range Goal Priority:  High Start Date: 10/15/20                            Expected End Date: 04/15/21  Next scheduled follow up: 01/01/21                  Over the next 180 days, patient/caregiver will:  - will take all medications exactly as prescribed - keep all scheduled MD appointments - report worsening sign/symptoms of dementia with or without change in behavior - call a family member to talk to for support and or respite care - continue follow up with the embedded BSW to assist with placement and or caregiver resources  - call 911 if patient becomes aggressive and or if she is fearful of her or the patient's safety     . COMPLETED: Evaluate and treat abnormal worsening Dementia with behavior changes       Timeframe:  Short-Term Goal Priority:  High Start Date:  10/15/20                           Expected End Date:  10/22/20   Next scheduled follow up date: 12/25/20  Over the next 30 days, patient/caregiver will: - start Nuplazid as directed by Neurology and take all other medications exactly as prescribed - contact the CCM team and or PCP for further concerns or questions  - call 911 for emergencies                        . Fall prevention   On track    Timeframe:  Long-Range Goal Priority:  High Start Date: 10/15/20                            Expected End Date:  04/15/21                    Next follow up date: 01/11/21  Over the next 90 days, patient/caregiver will:  - Utilize his walker (assistive device) appropriately with all ambulation - De-clutter walkways - Change positions slowly - Wear secure fitting shoes at all times with ambulation - Utilize home lighting for dim lit areas - Demonstrate self and pet awareness at all times    . Manage Urinary Incontinence   On track    Timeframe:  Long-Range Goal Priority:  High Start Date: 10/15/20                            Expected End  Date: 04/15/21                      Follow Up Date: 01/11/21    - keep skin dry - wear a protective pad or garment  - follow up with [facility] as directed by PCP for evaluation and treatment of elevated PSA - Avoid Alcohol - Caffeine - Carbonated drinks and sparkling water - Artificial sweeteners    Why is this important?    Leaking urine (pee) can cause soreness from skin rashes and redness.   This is caused by skin being exposed to urine (pee).    Notes:     [name] Monitor and  Manage My Blood Sugar-Diabetes Type 2   On track    Timeframe:  Long-Range Goal Priority:  High Start Date: 12/17/20                            Expected End Date: 06/16/21                     Follow Up Date: 01/01/21    . Attend all scheduled provider appointments . Check blood glucose levels as discussed and report abnormal readings to PCP promptly  . Take all prescribed medications as directed . Adhere to dietary and exercise recommendations    Why is this important?    Checking your blood sugar at home helps to keep it from getting very high or very low.   Writing the results in a diary or log helps the doctor know how to care for you.   Your blood sugar log should have the time, date and the results.   Also, write down the amount of insulin or other medicine that you take.   Other information, like what you ate, exercise done and how you were feeling, will also be helpful.     Notes:

## 2020-12-22 NOTE — Chronic Care Management (AMB) (Signed)
Chronic Care Management   CCM RN Visit Note  12/17/2020 Name: Dylan [name]. MRN: [id number] DOB: March 31, 1941  Subjective: Dylan Pop. is a 80 y.o. year old male who is a primary care patient of [name], MD. The care management team was consulted for assistance with disease management and care coordination needs.    Engaged with patient by telephone for follow up visit in response to provider referral for case management and/or care coordination services.   Consent to Services:  The patient was given information about Chronic Care Management services, agreed to services, and gave verbal consent prior to initiation of services.  Please see initial visit note for detailed documentation.   Patient agreed to services and verbal consent obtained.   Assessment: Review of patient past medical history, allergies, medications, health status, including review of consultants reports, laboratory and other test data, was performed as part of comprehensive evaluation and provision of chronic care management services.   SDOH (Social Determinants of Health) assessments and interventions performed:  Yes, see care plan   CCM Care Plan  No Known Allergies  Outpatient Encounter Medications as of 12/17/2020  Medication Sig  . Ascorbic Acid (VITAMIN C) 1000 MG tablet Take 1,000 mg by mouth every morning.   . carbidopa-levodopa (SINEMET IR) 25-100 MG tablet Take 2 tablets by mouth 3 (three) times daily.  . carvedilol (COREG) 12.5 MG tablet TAKE 1 TABLET BY MOUTH 2 TIMES DAILY. (Patient taking differently: Take 12.5 mg by mouth 2 (two) times daily with a meal.)  . Cholecalciferol (VITAMIN D3) 5000 units CAPS Take 5,000 Units by mouth daily with supper.   . COLCRYS 0.6 MG tablet Take 1 tablet (0.6 mg total) by mouth daily.  [name] desonide (DESOWEN) 0.05 % lotion Apply topically 2 times per day prn  . diclofenac Sodium (VOLTAREN) 1 % GEL Apply 2 g topically 3 (three) times daily.  [name] glucose blood  (ONETOUCH VERIO) test strip Use as instructed to check blood sugars daily dx: e11.22  . Lancet Devices (ONE TOUCH DELICA LANCING DEV) MISC Please fill ONE TOUCH DELICA LANCING DEVICE.  Use to test blood sugar twice daily as directed. E11.65 (Patient taking differently: Please fill ONE TOUCH DELICA LANCING DEVICE.  Use to test blood sugar twice daily as directed. E11.65)  . Lancets MISC Please fill basic/generic push button lancets.  Patient to test blood sugar twice daily. DX: E11.65  . linaclotide (LINZESS) 72 MCG capsule Take 1 capsule (72 mcg total) by mouth daily before breakfast.  . loratadine (CLARITIN) 10 MG tablet Take 1 tablet (10 mg total) by mouth daily.  . Melatonin 10 MG TABS Take 10 mg by mouth at bedtime.   . memantine (NAMENDA) 10 MG tablet TAKE 1 TABLET BY MOUTH TWICE A DAY (Patient taking differently: Take 10 mg by mouth 2 (two) times daily.)  . Pimavanserin Tartrate (NUPLAZID) 10 MG TABS Take 10 mg by mouth daily.  . pravastatin (PRAVACHOL) 20 MG tablet TAKE 1 TABLET BY ORAL ROUTE EVERY DAY  . QUEtiapine (SEROQUEL) 25 MG tablet TAKE 1 TABLET (25 MG TOTAL) BY MOUTH AT BEDTIME AS NEEDED. (Patient not taking: No sig reported)  . rivastigmine (EXELON) 4.6 mg/24hr Place 1 patch (4.6 mg total) onto the skin daily.  . TRULICITY 1.5 IO/9.6EX SOPN INJECT 1.5 MG INTO THE SKIN ONCE A WEEK. (Patient taking differently: Inject 1.5 mg into the skin every Monday.)  . [DISCONTINUED] metFORMIN (GLUCOPHAGE) 1000 MG tablet Take 1 tablet (1,000 mg total) by mouth  2 (two) times daily with a meal.   Facility-Administered Encounter Medications as of 12/17/2020  Medication  . cyanocobalamin ((VITAMIN B-12)) injection 1,000 mcg    Patient Active Problem List   Diagnosis Date Noted  . Encounter for care of pacemaker 07/11/2020  . 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 DNR (do not resuscitate)  06/08/2020  . Elevated CK 04/27/2020  . Gout   . Hyperlipemia   . Parkinsonism ([place]) 01/29/2020  . 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 Dementia with behavioral disturbance ([place]) 05/22/2019  . Gait abnormality 05/22/2019  . AKI (acute kidney injury) ([place]) 07/14/2018  . Nephropathy 07/14/2018  . Other disorders of lung 07/14/2018  . Atherosclerosis of aorta ([place]) 07/14/2018  . Dehydration 07/14/2018  . 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 CKD (chronic kidney disease), stage III ([place]) 06/13/2018  . Syncope 06/13/2018  . 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 Peripheral arterial disease ([place]) 05/13/2014  . Essential hypertension 05/13/2014  . Type 2 diabetes mellitus with stage 3a chronic kidney disease, without long-term current use of insulin ([place]) 05/13/2014    Conditions to be addressed/monitored:Type II DM, non-insulin dependent, CKD, stage III, Essential hypertension, Dementia, Parkinsonism, unspecified  Care Plan : Urinary Incontinence (Adult)  Updates made by Lynne Logan, RN since 12/22/2020 12:00 AM    Problem: Symptom Management (Urinary Incontinence)   Priority: High    Long-Range Goal: Urinary Incontinence Symptoms Manged   Start Date: 10/15/2020  Expected End Date: 04/15/2021  Recent Progress: On track  Priority: High  Note:   Current Barriers:   Ineffective Self Health Maintenance  Unable to self administer medications as prescribed  Unable to perform ADLs independently  Unable to perform IADLs independently  Currently UNABLE TO independently self manage needs related to chronic health conditions.   Knowledge Deficits related to short term plan for care coordination needs and long term plans for chronic disease management needs Nurse Case Manager Clinical Goal(s):   Over the next 180 days, patient will work with care management team to address care coordination and chronic disease  management needs related to Disease Management  Educational Needs  Care Coordination  Medication Management and Education  Psychosocial Support  Dementia and Caregiver Support   Interventions:  12/17/20 completed call with spouse [name]  Determined patient completed a Urology follow up with Dr. Rexene Alberts with [facility]  Determined patient is awaiting test results and will require evaluation pending these results Discussed spouse's understanding of the recommendations following the initial visit with Dr. Abner Greenspan Discussed plans with patient for ongoing care management follow up and provided patient with direct contact information for care management team Patient Goal/Self Care Activities:  - keep skin dry - wear a protective pad or garment  - follow up with [facility] as directed by PCP for evaluation and treatment of elevated PSA - Avoid Alcohol - Caffeine - Carbonated drinks and sparkling water - Artificial sweeteners  Follow Up Plan: Telephone follow up appointment with care management team member scheduled for: 01/11/21   Care Plan : Dementia (Adult)  Updates made by Lynne Logan, RN since 12/22/2020 12:00 AM    Problem: Behavioral Symptoms   Priority: High    Long-Range Goal: Behavior Symptoms Management   Start Date: 10/15/2020  Expected End Date: 04/15/2021  Recent Progress: On track  Priority: High  Note:   Current Barriers:   Ineffective Self Health Maintenance  Unable to self administer medications as prescribed  Lacks social connections  Unable to perform ADLs independently  Unable to perform IADLs independently  Currently UNABLE TO independently self manage needs related to chronic health conditions.   Knowledge Deficits related to short term plan for care coordination needs and long term plans for chronic disease management needs Nurse Case Manager Clinical Goal(s):   Over the next 180 days, patient will work with care management team to  address care coordination and chronic disease management needs related to Disease Management  Educational Needs  Care Coordination  Medication Management and Education  Psychosocial Support  Dementia and Caregiver Support   Interventions:  12/17/20 call completed with spouse [name]   Determined Dylan [name] followed up with Neuro for further evaluation and treatment of his Parkinson's disease and worsening behavior changes  Determined [name] opted not to start Nuplazid at this time due to patient's behavior has improved, she reports his mood and behavior have been good   Discussed patient's next scheduled f/u with Neurology is scheduled for 12/24/20 @1 :30 PM   Discussed with spouse [name] for ongoing care management follow up and provided patient with direct contact information for care management team Patient Goals/Self Care Activities:  Over the next 180 days, patient/caregiver will: - start Nuplazid as directed by Neurology and take all other medications exactly as prescribed - contact the CCM team and or PCP for further concerns or questions  - call 911 for emergencies  Follow Up Plan: Telephone follow up appointment with care management team member scheduled for: 01/01/21   Problem: Caregiver Stress Resolved 12/17/2020  Priority: High    Long-Range Goal: Caregiver Coping Optimized Completed 12/22/2020  Start Date: 10/15/2020  Expected End Date: 01/13/2021  Recent Progress: On track  Priority: High  Note:   Current Barriers:   Ineffective Self Health Maintenance  Unable to self administer medications as prescribed  Lacks social connections  Unable to perform ADLs independently  Unable to perform IADLs independently  Currently UNABLE TO independently self manage needs related to chronic health conditions.   Knowledge Deficits related to short term plan for care coordination needs and long term plans for chronic disease management needs Nurse Case Manager Clinical  Goal(s):   Over the next 30 days, patient will work with care management team to address care coordination and chronic disease management needs related to Disease Management  Educational Needs  Care Coordination  Medication Management and Education  Psychosocial Support  Dementia and Caregiver Support Interventions:   Recognize and validate the complex nature of dementia, as well as the impact on the caregiver and extended family; provide education and support to align with needs and stage of dementia.   Acknowledge feelings of grief associated with dementia such as loss of relationship, recreational activities, future with patient and loss associated with out-of-home placement.   Encourage verbalization of feelings without ruminating.   Refer for or provide psychoeducation activities such as cognitive reframing to improve family/caregiver quality of life, wellbeing, confidence, perception of burden, mental health and self-efficacy.   Encourage use of individualized coping strategies that may include yoga, spiritual support, distraction, exercise, relaxation, music, massage, music therapy and outdoor activities to utilize nature's restorative properties.   Provide proactive acknowledgement of potential for depressive symptoms to appear; normalize response to burden of caregiving; refer to primary care provider or for mental health services.   Patient Goals/Self Care Activities:  Over the  next 90 days, patient/caregiver will:  - will take all medications exactly as prescribed - keep all scheduled MD appointments - report worsening sign/symptoms of dementia with or without change in behavior - call a family member to talk to for support and or respite care - continue follow up with the embedded BSW to assist with placement and or caregiver resources  - call 911 if patient becomes aggressive and or if she is fearful of her or the patient's safety   Follow Up Plan: Telephone follow up  appointment with care management team member scheduled for: 12/25/20   Care Plan : Fall Risk (Adult)  Updates made by Lynne Logan, RN since 12/22/2020 12:00 AM    Problem: Fall Risk   Priority: High    Long-Range Goal: Absence of Fall and Fall-Related Injury   Start Date: 10/15/2020  Expected End Date: 04/15/2021  Recent Progress: On track  Priority: High  Note:   Current Barriers:  [name] Knowledge Deficits related to fall precautions in patient with Type II DM, non-insulin dependent, CKD, stage III, Essential hypertension, Dementia, Parkinsonism, unspecified . Decreased adherence to prescribed treatment for fall prevention . Unable to self administer medications as prescribed . Lacks social connections . Unable to perform ADLs independently . Unable to perform IADLs independently . Chronic Disease Management support and education needs related to Type II DM, non-insulin dependent, CKD, stage III, Essential hypertension, Dementia, Parkinsonism, unspecified . Cognitive Deficits Clinical Goal(s):  [name] Over the next 180 days, patient/caregiver will demonstrate improved adherence to prescribed treatment plan for decreasing falls as evidenced by patient reporting and review of EMR . Over the next 180 days, patient/caregiver will verbalize using fall risk reduction strategies discussed Interventions:  . Reviewed medications and discussed potential side effects of medications such as dizziness and frequent urination . Assessed for s/s of orthostatic hypotension . Assessed for falls since last encounter. . Assessed patients knowledge of fall risk prevention secondary to previously provided education. . Assessed working status of life alert bracelet and patient adherence . Provided patient information for fall alert systems . Develop a fall prevention plan with the patient and family.  . Assess assistance level required for safe and effective self-care; consider referral for home care.   . Discussed plans with patient for ongoing care management follow up and provided patient with direct contact information for care management team 10/22/20 completed inbound call with spouse [name] . Determined patient experienced a fall in his home last evening requiring his spouse to call 911 for assistance . Determined spouse states he leaned sideways while walking down the hall while using his walker and fell into the wall at which time he slid down into the floor gently . Determined per spouse Dylan [name] did not sustain any injuries . Reinforced fall prevention techniques . Sent in basket message to PCP and Neuro to make them aware . Discussed with spouse next Neuro f/u is scheduled for 10/26/20 @ 1:45 PM  . Discussed plans with patient for ongoing care management follow up and provided patient with direct contact information for care management team Patient Goals/Self-Care Activities . Over the next 180 days, patient will:   - Utilize his walker (assistive device) appropriately with all ambulation - De-clutter walkways - Change positions slowly - Wear secure fitting shoes at all times with ambulation - Utilize home lighting for dim lit areas - Demonstrate self and pet awareness at all times - report any/all falls to PCP promptly  Follow Up Plan: Telephone  follow up appointment with care management team member scheduled for:  01/01/21   Care Plan : Diabetes Type 2 (Adult)  Updates made by Lynne Logan, RN since 12/22/2020 12:00 AM    Problem: Disease Progression (Diabetes, Type 2)   Priority: Medium    Long-Range Goal: Disease Progression Prevented or Minimized   Start Date: 12/17/2020  Expected End Date: 06/16/2021  This Visit's Progress: On track  Priority: Medium  Note:   Spouse stated Current Barriers:  [name] Knowledge Deficits related to disease process and Self Health management of Diabetes  . Chronic Disease Management support and education needs related to CKDIII, DMII,  HTN, memory loss . Currently UNABLE TO independently perform self-care without assistance  Nurse Case Manager Clinical Goal(s):  [name] Over the next 180 days, patient and spouse will verbalize increased adherence with following dietary recommendations to help lower A1c as evidence by patient will have an A1c <7.0 CCM RN CM Interventions:  12/17/20 call completed with spouse [name] . Evaluation of current treatment plan related to diabetes and patient's adherence to plan as established by provider . Reviewed and discussed patient's current A1c is elevated to 7.0%; Reiterated dietary and exercise recommendations; Educated on daily glycemic control; FBS 80-130, <180 after meals . Re-educated spouse [name], providing education and rationale, to check cbg daily before meals and record, calling the CCM team and or PCP for findings outside established parameters . Reviewed medications with spouse and discussed patient is adhering to taking his weekly injection of Trulicity and patient is self injecting; with spouse [name] supervising dosage and administration . Discussed plans with patient for ongoing care management follow up and provided patient with direct contact information for care management team Patient Goals/Self Care Activities:  . Attend all scheduled provider appointments . Check blood glucose levels as discussed and report abnormal readings to PCP promptly  . Take all prescribed medications as directed . Adhere to dietary and exercise recommendations  Next scheduled follow up appointment: 01/01/21    Plan:Telephone follow up appointment with care management team member scheduled for:  01/01/21  [name], RN, BSN, CCM Care Management Coordinator [facility]  Direct Phone: [phone]

## 2020-12-22 NOTE — Progress Notes (Signed)
Pt is here today for a B12 Injection.

## 2020-12-24 ENCOUNTER — Ambulatory Visit: Payer: Medicare HMO | Admitting: Neurology

## 2020-12-24 ENCOUNTER — Encounter: Payer: Self-pay | Admitting: Neurology

## 2020-12-24 ENCOUNTER — Other Ambulatory Visit: Payer: Self-pay

## 2020-12-24 VITALS — BP 119/70 | HR 71 | Ht 66.0 in | Wt 167.0 lb

## 2020-12-24 DIAGNOSIS — K5909 Other constipation: Secondary | ICD-10-CM | POA: Diagnosis not present

## 2020-12-24 DIAGNOSIS — F0391 Unspecified dementia with behavioral disturbance: Secondary | ICD-10-CM | POA: Diagnosis not present

## 2020-12-24 DIAGNOSIS — G20A1 Parkinson's disease without dyskinesia, without mention of fluctuations: Secondary | ICD-10-CM | POA: Insufficient documentation

## 2020-12-24 DIAGNOSIS — R269 Unspecified abnormalities of gait and mobility: Secondary | ICD-10-CM

## 2020-12-24 DIAGNOSIS — G20 Parkinson's disease: Secondary | ICD-10-CM | POA: Diagnosis not present

## 2020-12-24 MED ORDER — METFORMIN HCL 1000 MG PO TABS: ORAL_TABLET | ORAL | 1 refills | Status: AC

## 2020-12-24 MED ORDER — CARBIDOPA-LEVODOPA 25-100 MG PO TABS: 2.0000 | ORAL_TABLET | Freq: Three times a day (TID) | ORAL | 11 refills | Status: AC

## 2020-12-24 NOTE — Progress Notes (Signed)
HISTORY OF PRESENT ILLNESS:   Dylan Shannonis a 80 year old male, seen in request by his primary care physician [name], [name], for evaluation of memory loss, he is accompanied by his fiance [name] at today's visit on May 22, 2019  Past medical history ypertension,  diabetes,  Hyperlipidemia,  He retired from Architect work at age 26, he has been sedentary over the past 20 years since he retired, spends most of the time sleeping, watching TV, he used to smoke, quit more than 10 years ago, [name] knows him since 2005, noticed gradual changes, patient has become forgetful, emotional outburst, sometimes verbal even physically abusive, he still drives short distance, spent a lot of time sleeping, tends to repeat questions, also had a gradual onset gait abnormality He denies family history of memory loss, today's Mini-Mental Status Examination is 15 out of 30  Laboratory evaluations in 2020, normal B12, methylmalonic acid, RPR, TSH, A1c was 8.2  UPDATE January 29 2020: Patient is not a candidate for MRI due to history of pacemaker, CT head without contrast August 2020, generalized atrophy, supratentorium small vessel disease, there was no acute abnormalities.  I reviewed email from care manager Assunta Gambles on January 17, 2020, patient was enrolled in the chronic care management program, Levada Dy has worked with patient and his spouse [name] for a while, [name] is hesitated to report history in front of patient because patient tends to get angry with her, he was noted to have continued decline, worsening gait abnormality, slow processing time, with delayed physical reaction.  He also attempt to renew his driver license, but [name] does not think he is safe to drive anymore, he was put on Namenda 10 mg twice a day, which has worsened since he stopped the nighttime dose of Namenda,  He was noted to have significant rigidity, retropulsion stability vertical eye movement abnormality  at today's examination,  UPDATE Sept 27 2021: He is accompanied by his wife at today's visit, continue to slow decline, still ambulate without walker sometimes, very stiff, rigid, tendency to lean back, very slow unsteady gait, is taking Sinemet 25/100 mg 1 and 1/2 tablets 3 times a day, 10 AM, 5 PM, 10 PM, napping throughout the day, sitting down most of the time, sometimes agitation become verbally abusive,  He was admitted hospital on June 28 through May 11, 2020, for orthostatic hypotension, passing out spells, was found to have acute kidney failure on chronic kidney disease, in the setting of dehydration, poor oral intake, elevated CPK 3200, that was quickly normalized to within normal limit 275, likely due to fall related to muscle injury  Personally reviewed CT head without contrast on April 27, 2020, generalized atrophy, supratentorium small vessel disease, no acute abnormality  CT of abdomen/pelvic showed remote L4 L3 compression fracture, minimally displaced fracture through anterior inferior aspect of L1 vertebral body, enlarged prostate gland  Laboratory evaluation showed A1c of 7.0, CBC showed hemoglobin of 11.7, BMP showed elevated glucose 213, normal creatinine 1.12, CPK was 3228, normalized to 275, normal TSH 1.67, UDS was negative, normal B12 253,  UPDATE Dec 24 2020 He is accompanied by his wife at today's clinical visit, has been fairly stable over the past couple months, continue to decline overall, worsening gait abnormality, confusion, no longer have significant nighttime agitations,  He is taking Sinemet 25/100 mg at 10, 5 PM, 10 PM,  I have personally reviewed. CT head 10/15/2020 Atrophy with periventricular small vessel disease. No acute infarct demonstrable. No mass or  hemorrhage.  There are foci of arterial vascular calcification. There is mucosal thickening in several ethmoid air cells. There is probable cerumen in the left external auditory canal.  REVIEW OF  SYSTEMS: Out of a complete 14 system review of symptoms, the patient complains only of the following symptoms, and all other reviewed systems are negative.   ALLERGIES: No Known Allergies  HOME MEDICATIONS: Outpatient Medications Prior to Visit  Medication Sig Dispense Refill  . Ascorbic Acid (VITAMIN C) 1000 MG tablet Take 1,000 mg by mouth every morning.     . carbidopa-levodopa (SINEMET IR) 25-100 MG tablet Take 2 tablets by mouth 3 (three) times daily. 180 tablet 11  . carvedilol (COREG) 12.5 MG tablet TAKE 1 TABLET BY MOUTH 2 TIMES DAILY. (Patient taking differently: Take 12.5 mg by mouth 2 (two) times daily with a meal.) 60 tablet 11  . Cholecalciferol (VITAMIN D3) 5000 units CAPS Take 5,000 Units by mouth daily with supper.     . COLCRYS 0.6 MG tablet Take 1 tablet (0.6 mg total) by mouth daily. 90 tablet 1  . desonide (DESOWEN) 0.05 % lotion Apply topically 2 times per day prn 59 mL 1  . diclofenac Sodium (VOLTAREN) 1 % GEL Apply 2 g topically 3 (three) times daily. 150 g 0  . glucose blood (ONETOUCH VERIO) test strip Use as instructed to check blood sugars daily dx: e11.22 150 each 3  . Lancet Devices (ONE TOUCH DELICA LANCING DEV) MISC Please fill ONE TOUCH DELICA LANCING DEVICE.  Use to test blood sugar twice daily as directed. E11.65 (Patient taking differently: Please fill ONE TOUCH DELICA LANCING DEVICE.  Use to test blood sugar twice daily as directed. E11.65) 1 each 1  . Lancets MISC Please fill basic/generic push button lancets.  Patient to test blood sugar twice daily. DX: E11.65 100 each 4  . linaclotide (LINZESS) 72 MCG capsule Take 1 capsule (72 mcg total) by mouth daily before breakfast. 30 capsule 0  . loratadine (CLARITIN) 10 MG tablet Take 1 tablet (10 mg total) by mouth daily. 90 tablet 1  . Melatonin 10 MG TABS Take 10 mg by mouth at bedtime.     . memantine (NAMENDA) 10 MG tablet TAKE 1 TABLET BY MOUTH TWICE A DAY (Patient taking differently: Take 10 mg by mouth 2  (two) times daily.) 60 tablet 14  . metFORMIN (GLUCOPHAGE) 1000 MG tablet Take 1 tablet by mouth daily 90 tablet 1  . Pimavanserin Tartrate (NUPLAZID) 10 MG TABS Take 10 mg by mouth daily. 30 tablet 3  . pravastatin (PRAVACHOL) 20 MG tablet TAKE 1 TABLET BY ORAL ROUTE EVERY DAY 30 tablet 2  . QUEtiapine (SEROQUEL) 25 MG tablet TAKE 1 TABLET (25 MG TOTAL) BY MOUTH AT BEDTIME AS NEEDED. 90 tablet 1  . rivastigmine (EXELON) 4.6 mg/24hr Place 1 patch (4.6 mg total) onto the skin daily. 30 patch 12  . TRULICITY 1.5 YI/9.4WN SOPN INJECT 1.5 MG INTO THE SKIN ONCE A WEEK. (Patient taking differently: Inject 1.5 mg into the skin every Monday.) 12 mL 2   Facility-Administered Medications Prior to Visit  Medication Dose Route Frequency Provider Last Rate Last Admin  . cyanocobalamin ((VITAMIN B-12)) injection 1,000 mcg  1,000 mcg Intramuscular Once [name], MD        PAST MEDICAL HISTORY: Past Medical History:  Diagnosis Date  . Anxiety   . Aortic stenosis    mild AS 11/24/16 (peak grad 24, mean grad 11) Dr. [name]  . CHB (complete  heart block) ([place]) 07/2017  . 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) 07/11/2017  . Diabetes mellitus without complication ([place])   . Encounter for care of pacemaker 07/11/2020  . Gait abnormality   . Gout   . Hyperlipemia   . Hypertension   . 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 Memory loss   . Pacemaker Dual chamber [place] [facility]  model [id number] 07/11/2020 06/08/2020  . Peripheral arterial disease ([place])   . 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 Second degree AV block    Wenckebach; no indication for pacemaker as of 12/01/16 (Dr. [name])  . Vitamin B12 deficiency anemia 03/01/2018  . Vitamin D deficiency disease     PAST SURGICAL HISTORY: Past Surgical History:  Procedure Laterality Date  . CARDIOVASCULAR STRESS TEST     11/21/16 Low risk study, EF 52% [facility])   . EYE SURGERY    . KYPHOPLASTY N/A 02/15/2017   Procedure: LUMBAR FOUR KYPHOPLASTY;  Surgeon: Phylliss Bob, MD;  Location: [facility];  Service: Orthopedics;  Laterality: N/A;  . lipoma surgery     neck - 30 years ago  . PACEMAKER IMPLANT N/A 07/11/2017   Procedure: Pacemaker Implant;  Surgeon: Constance Haw, MD;  Location: [place];  Service: Cardiovascular;  Laterality: N/A;  . TRANSTHORACIC ECHOCARDIOGRAM     11/24/16 [facility]): EF 55-60%, mild AS, mild-mod MR, mod TR, moderate pulm HTN, PAP 49 mmHg    FAMILY HISTORY: Family History  Problem Relation Age of Onset  . Diabetes Mother   . Breast cancer Mother   . Arthritis Mother   . Hypertension Father   . Diabetes Sister   . Diabetes Brother   . Diabetes Maternal Grandmother   . Diabetes Brother   . Diabetes Brother   . Diabetes Brother   . Diabetes Sister   . Diabetes Sister   . Diabetes Sister     SOCIAL HISTORY: Social History   Socioeconomic History  . Marital status: Married    Spouse name: Not on file  . Number of children: 3  . Years of education: 90  . Highest education level: High school graduate  Occupational History  . Occupation: retired  Tobacco Use  . Smoking status: Former Smoker    Packs/day: 0.50    Years: 7.00    Pack years: 3.50    Types: Cigarettes  . Smokeless tobacco: Never Used  . Tobacco comment: quit 35 years  Vaping Use  . Vaping Use: Never used  Substance and Sexual Activity  . Alcohol use: No  . Drug use: No  . Sexual activity: Not Currently  Other Topics Concern  . Not on file  Social History Narrative   Lives at home with his fiancee.   Right-handed.   No daily use of caffeine.      Social Determinants of Health   Financial Resource Strain: Low Risk   . Difficulty of Paying Living Expenses: Not hard at all  Food Insecurity: No Food Insecurity  . Worried About Charity fundraiser in the Last Year: Never true  . Ran Out of Food in the Last Year: Never  true  Transportation Needs: No Transportation Needs  . Lack of Transportation (Medical): No  . Lack of Transportation (Non-Medical): No  Physical Activity: Sufficiently Active  . Days of Exercise per Week: 3 days  . Minutes of Exercise per Session: 60 min  Stress:  No Stress Concern Present  . Feeling of Stress : Not at all  Social Connections: Not on file  Intimate Partner Violence: Not on file   PHYSICAL EXAM  Vitals:   12/24/20 1327  BP: 119/70  Pulse: 71  Weight: 167 lb (75.8 kg)  Height: 5\' 6"  (1.676 m)   Body mass index is 26.95 kg/m.   PHYSICAL EXAMNIATION:  Gen: NAD, conversant, well nourised, well groomed                     Cardiovascular: Regular rate rhythm, no peripheral edema, warm, nontender. Eyes: Conjunctivae clear without exudates or hemorrhage Neck: Supple, no carotid bruits. Pulmonary: Clear to auscultation bilaterally   NEUROLOGICAL EXAM:  MENTAL STATUS: Speech/Cognition: His wife to provide history, quiet, cooperative on examination with visual cue,  CRANIAL NERVES: CN II: Visual fields are full to confrontation.  Pupils are round equal and briskly reactive to light. CN III, IV, VI: Vertical gaze palsy CN V: Facial sensation is intact to light touch. CN VII: Face is symmetric with normal eye closure and smile. CN VIII: Hearing is normal to casual conversation CN IX, X: Palate elevates symmetrically. Phonation is normal. CN XI: Head turning and shoulder shrug are intact   MOTOR: Left more than right moderate rigidity, bradykinesia, no significant muscle weakness,  REFLEXES: Hypoactive symmetric  SENSORY: Withdrawal to pain  COORDINATION: There is no trunk or limb ataxia.    GAIT/STANCE: Need help to get up from seated position, leaning forward, rely on his walker, tends to walk on tiptoe, unstable, pointing left foot outwards, dragging left foot more   DIAGNOSTIC DATA (LABS, IMAGING, TESTING) - I reviewed patient records, labs,  notes, testing and imaging myself where available.  Lab Results  Component Value Date   WBC 5.1 10/15/2020   HGB 12.6 (L) 10/15/2020   HCT 37.0 (L) 10/15/2020   MCV 87.7 10/15/2020   PLT 178 10/15/2020      Component Value Date/Time   NA 140 10/15/2020 1219   NA 141 03/24/2020 1624   K 4.2 10/15/2020 1219   CL 102 10/15/2020 1219   CO2 25 10/15/2020 1219   GLUCOSE 165 (H) 10/15/2020 1219   BUN 15 10/15/2020 1219   BUN 25 03/24/2020 1624   CREATININE 1.04 10/15/2020 1219   CALCIUM 9.4 10/15/2020 1219   PROT 6.8 11/03/2020 1410   ALBUMIN 3.4 (L) 10/15/2020 1219   ALBUMIN 4.5 03/24/2020 1624   AST 16 10/15/2020 1219   ALT <5 10/15/2020 1219   ALKPHOS 106 10/15/2020 1219   BILITOT 0.5 10/15/2020 1219   BILITOT 0.3 03/24/2020 1624   GFRNONAA >60 10/15/2020 1219   GFRAA >60 05/11/2020 0430   Lab Results  Component Value Date   CHOL 126 11/03/2020   HDL 49 11/03/2020   LDLCALC 65 11/03/2020   TRIG 52 11/03/2020   CHOLHDL 2.6 11/03/2020   Lab Results  Component Value Date   HGBA1C 7.0 (H) 11/03/2020   Lab Results  Component Value Date   VITAMINB12 269 11/03/2020   Lab Results  Component Value Date   TSH 1.679 04/27/2020   ASSESSMENT AND PLAN 80 y.o. year old male    Parkinsonian syndrome  Dementia with behavioral issues  Central nervous system degenerative disorder, Parkinson features, and advanced dementia, less likely to be idiopathic Parkinson's disease, most likely Parkinson-like syndrome, such as progressive supranuclear palsy versus multisystem atrophy  Agitation has much improved, on lower dose of Exelon patch 4.6 mg daily,  Nuplazid 10 mg daily helped per record, but is no longer he on his medication list,  Only use Seroquel as needed  Continue carbidopa levodopa 25/100 mg, will schedule to 10 AM, 2 PM, 6 PM,  Refer to home physical therapy  Marcial Pacas, M.D. Ph.D.  [facility] [place], [place] [address] Phone:  [phone] Fax:      [phone]

## 2020-12-25 ENCOUNTER — Telehealth: Payer: Medicare HMO

## 2020-12-30 ENCOUNTER — Encounter: Payer: Self-pay | Admitting: Internal Medicine

## 2021-01-01 ENCOUNTER — Other Ambulatory Visit: Payer: Self-pay

## 2021-01-01 ENCOUNTER — Ambulatory Visit (INDEPENDENT_AMBULATORY_CARE_PROVIDER_SITE_OTHER): Payer: Medicare HMO

## 2021-01-01 ENCOUNTER — Telehealth: Payer: Self-pay

## 2021-01-01 VITALS — BP 124/74 | HR 64 | Temp 98.0°F

## 2021-01-01 DIAGNOSIS — E538 Deficiency of other specified B group vitamins: Secondary | ICD-10-CM

## 2021-01-01 MED ORDER — CYANOCOBALAMIN 1000 MCG/ML IJ SOLN
1000.0000 ug | Freq: Once | INTRAMUSCULAR | Status: AC
  Administered 2021-01-01: 1000 ug via INTRAMUSCULAR

## 2021-01-01 NOTE — Telephone Encounter (Signed)
-----   Message from [name], MD sent at 12/31/2020 12:37 PM EST ----- Pls confirm name of home health agency he already has so I know how to send referral to  ----- Message ----- From: [name], [place]: 12/31/2020  10:18 AM EST To: [name], MD  [name] said that the pt needs physical therapy to help with his walking.

## 2021-01-01 NOTE — Progress Notes (Signed)
Pt is here today for b12 injection.

## 2021-01-05 ENCOUNTER — Encounter (HOSPITAL_COMMUNITY)
Admission: RE | Admit: 2021-01-05 | Discharge: 2021-01-05 | Disposition: A | Discharge status: Home / Self Care | Payer: Medicare HMO | Source: Ambulatory Visit | Attending: Urology | Admitting: Urology

## 2021-01-05 ENCOUNTER — Other Ambulatory Visit: Payer: Self-pay

## 2021-01-05 ENCOUNTER — Encounter: Payer: Self-pay | Admitting: Internal Medicine

## 2021-01-05 DIAGNOSIS — C61 Malignant neoplasm of prostate: Secondary | ICD-10-CM | POA: Insufficient documentation

## 2021-01-05 MED ORDER — TECHNETIUM TC 99M MEDRONATE IV KIT
20.0000 | PACK | Freq: Once | INTRAVENOUS | Status: AC | PRN
  Administered 2021-01-05: 22 via INTRAVENOUS

## 2021-01-07 ENCOUNTER — Encounter: Payer: Self-pay | Admitting: Cardiology

## 2021-01-07 ENCOUNTER — Ambulatory Visit: Payer: Medicare HMO | Admitting: Cardiology

## 2021-01-07 ENCOUNTER — Other Ambulatory Visit: Payer: Self-pay

## 2021-01-07 VITALS — BP 159/72 | HR 61 | Temp 97.5°F | Resp 17 | Ht 66.0 in | Wt 176.0 lb

## 2021-01-07 DIAGNOSIS — G903 Multi-system degeneration of the autonomic nervous system: Secondary | ICD-10-CM

## 2021-01-07 DIAGNOSIS — Z950 Presence of cardiac pacemaker: Secondary | ICD-10-CM

## 2021-01-07 DIAGNOSIS — I442 Atrioventricular block, complete: Secondary | ICD-10-CM

## 2021-01-07 DIAGNOSIS — Z66 Do not resuscitate: Secondary | ICD-10-CM

## 2021-01-07 DIAGNOSIS — I10 Essential (primary) hypertension: Secondary | ICD-10-CM

## 2021-01-07 NOTE — Progress Notes (Unsigned)
Primary Physician/Referring:  [name], MD  Patient ID: Dylan Ina., male    DOB: 1941-06-04, 80 y.o.   MRN: [id number]  Chief Complaint  Patient presents with  . Follow-up    6 MONTH  . Hypertension  . ORTHOSTATIC HYPOTENSION   HPI:    Dylan Rondon.  is a 80 y.o. 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  After diuresis, holding his blood pressure medications, serum creatinine improved, was discharged home and recommended outpatient evaluation.  He is accompanied by his wife today.  This is an 6 month visit. Patient is very slow to react and speaks very few words.  Denies any specific symptoms or complaints.  Wife gives most of the history.  He is presently on carvedilol alone and beta-blockers and other vasodilators were discontinued due to marked orthostasis and low blood pressure.  He is presently doing well and has remained stable since and has not had any fall, he is able to walk at home with help from his wife and a walker.  Past Medical History:  Diagnosis Date  . Anxiety   . Aortic stenosis    mild AS 11/24/16 (peak grad 24, mean grad 11) Dr. [name]  . CHB (complete heart block) ([place]) 07/2017  . 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[place]) 07/11/2017  . Diabetes mellitus without complication ([place])   . Encounter for care of pacemaker 07/11/2020  . Gait abnormality   . Gout   . Hyperlipemia   . Hypertension   . 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 Memory loss   . Pacemaker Dual chamber [place] MRI  model [id number] 07/11/2020 06/08/2020  . Peripheral arterial disease ([place])   . Presence of permanent cardiac pacemaker 07/11/2017   . Second degree AV block    Wenckebach; no indication for pacemaker as of 12/01/16 (Dr. [name])  . Vitamin B12 deficiency anemia 03/01/2018  . Vitamin D deficiency disease    Past Surgical History:  Procedure Laterality Date  . CARDIOVASCULAR STRESS TEST     11/21/16 Low risk study, EF 52% [facility])  . EYE SURGERY    . KYPHOPLASTY N/A 02/15/2017   Procedure: LUMBAR FOUR KYPHOPLASTY;  Surgeon: Phylliss Bob, MD;  Location: [place];  Service: Orthopedics;  Laterality: N/A;  . lipoma surgery     neck - 30 years ago  . PACEMAKER IMPLANT N/A 07/11/2017   Procedure: Pacemaker Implant;  Surgeon: Constance Haw, MD;  Location: [place] CV LAB;  Service: Cardiovascular;  Laterality: N/A;  . TRANSTHORACIC ECHOCARDIOGRAM     11/24/16 [facility] CV): EF 55-60%, mild AS, mild-mod MR, mod TR, moderate pulm HTN, PAP 49 mmHg   Family History  Problem Relation Age of Onset  . Diabetes Mother   . Breast cancer Mother   . Arthritis Mother   . Hypertension Father   . Diabetes Sister   . Diabetes Brother   . Diabetes Maternal Grandmother   . Diabetes Brother   . Diabetes Brother   . Diabetes Brother   . Diabetes Sister   . Diabetes Sister   . Diabetes Sister     Social History   Tobacco Use  . Smoking status: Former Smoker    Packs/day:  0.50    Years: 7.00    Pack years: 3.50    Types: Cigarettes  . Smokeless tobacco: Never Used  . Tobacco comment: quit 35 years  Substance Use Topics  . Alcohol use: No   Marital Status: Married  ROS  Review of Systems  Constitutional: Positive for malaise/fatigue.  Neurological: Positive for disturbances in coordination, dizziness, loss of balance and tremors.  Psychiatric/Behavioral: Positive for memory loss.   Objective  Blood pressure (!) 159/72, pulse 61, temperature (!) 97.5 F (36.4 C), temperature source Temporal, resp. rate 17, height 5\' 6"  (1.676 m), weight 176 lb (79.8 kg), SpO2 98 %.  Vitals with BMI 01/07/2021  01/01/2021 12/24/2020  Height 5\' 6"  (No Data) 5\' 6"   Weight 176 lbs (No Data) 167 lbs  BMI 48.25 - 00.37  Systolic 048 889 169  Diastolic 72 74 70  Pulse 61 64 71    Orthostatic VS for the past 72 hrs (Last 3 readings):  Orthostatic BP Patient Position BP Location Cuff Size Orthostatic Pulse  01/07/21 1455 131/65 Standing Left Arm Normal 71  01/07/21 1454 134/65 Sitting Left Arm Normal 68  01/07/21 1453 149/73 Supine Left Arm Normal 61      Physical Exam Constitutional:      General: He is not in acute distress. Cardiovascular:     Rate and Rhythm: Normal rate and regular rhythm.     Pulses: Intact distal pulses.     Heart sounds: Heart sounds are distant. No murmur heard. No gallop.      Comments: No leg edema, no JVD. Pulmonary:     Effort: Pulmonary effort is normal.     Breath sounds: Normal breath sounds.  Abdominal:     General: Bowel sounds are normal.     Palpations: Abdomen is soft.  Neurological:     Mental Status: He is alert.     Motor: Weakness present.     Coordination: Coordination abnormal.     Gait: Gait abnormal.    Laboratory examination:   Recent Labs    05/04/20 0423 05/06/20 0809 05/11/20 0430 10/07/20 1308 10/15/20 1219  NA 138 135  --  140 140  K 3.5 3.5  --  4.2 4.2  CL 105 101  --  103 102  CO2 26 24  --  25 25  GLUCOSE 99 213*  --  150* 165*  BUN 20 16  --  18 15  CREATININE 1.13 1.12 1.17 1.15 1.04  CALCIUM 9.2 9.1  --  9.5 9.4  GFRNONAA >60 >60 59* >60 >60  GFRAA >60 >60 >60  --   --    CrCl cannot be calculated (Patient's most recent lab result is older than the maximum 21 days allowed.).  CMP Latest Ref Rng & Units 11/03/2020 10/15/2020 10/07/2020  Glucose 70 - 99 mg/dL - 165(H) 150(H)  BUN 8 - 23 mg/dL - 15 18  Creatinine 0.61 - 1.24 mg/dL - 1.04 1.15  Sodium 135 - 145 mmol/L - 140 140  Potassium 3.5 - 5.1 mmol/L - 4.2 4.2  Chloride 98 - 111 mmol/L - 102 103  CO2 22 - 32 mmol/L - 25 25  Calcium 8.9 - 10.3 mg/dL - 9.4 9.5   Total Protein 6.0 - 8.5 g/dL 6.8 6.8 -  Total Bilirubin 0.3 - 1.2 mg/dL - 0.5 -  Alkaline Phos 38 - 126 U/L - 106 -  AST 15 - 41 U/L - 16 -  ALT 0 - 44 U/L - <5 -  CBC Latest Ref Rng & Units 10/15/2020 10/07/2020 05/06/2020  WBC 4.0 - 10.5 K/uL 5.1 5.4 4.3  Hemoglobin 13.0 - 17.0 g/dL 12.6(L) 12.3(L) 11.7(L)  Hematocrit 39.0 - 52.0 % 37.0(L) 37.7(L) 36.1(L)  Platelets 150 - 400 K/uL 178 174 203    Lipid Panel Recent Labs    11/03/20 1410  CHOL 126  TRIG 52  LDLCALC 65  HDL 49  CHOLHDL 2.6    HEMOGLOBIN A1C Lab Results  Component Value Date   HGBA1C 7.0 (H) 11/03/2020   MPG 163 02/09/2017   TSH Recent Labs    04/27/20 1844  TSH 1.679   Medications and allergies  No Known Allergies   Outpatient Medications Prior to Visit  Medication Sig Dispense Refill  . Ascorbic Acid (VITAMIN C) 1000 MG tablet Take 1,000 mg by mouth every morning.     . carbidopa-levodopa (SINEMET IR) 25-100 MG tablet Take 2 tablets by mouth 3 (three) times daily. 180 tablet 11  . carvedilol (COREG) 12.5 MG tablet TAKE 1 TABLET BY MOUTH 2 TIMES DAILY. (Patient taking differently: Take 12.5 mg by mouth 2 (two) times daily with a meal.) 60 tablet 11  . Cholecalciferol (VITAMIN D3) 5000 units CAPS Take 5,000 Units by mouth daily with supper.     . desonide (DESOWEN) 0.05 % lotion Apply topically 2 times per day prn 59 mL 1  . diclofenac Sodium (VOLTAREN) 1 % GEL Apply 2 g topically 3 (three) times daily. (Patient taking differently: Apply 2 g topically as needed.) 150 g 0  . glucose blood (ONETOUCH VERIO) test strip Use as instructed to check blood sugars daily dx: e11.22 150 each 3  . Lancet Devices (ONE TOUCH DELICA LANCING DEV) MISC Please fill ONE TOUCH DELICA LANCING DEVICE.  Use to test blood sugar twice daily as directed. E11.65 (Patient taking differently: Please fill ONE TOUCH DELICA LANCING DEVICE.  Use to test blood sugar twice daily as directed. E11.65) 1 each 1  . Lancets MISC Please fill  basic/generic push button lancets.  Patient to test blood sugar twice daily. DX: E11.65 100 each 4  . linaclotide (LINZESS) 72 MCG capsule Take 1 capsule (72 mcg total) by mouth daily before breakfast. 30 capsule 0  . Melatonin 10 MG TABS Take 20 mg by mouth at bedtime.    . memantine (NAMENDA) 10 MG tablet TAKE 1 TABLET BY MOUTH TWICE A DAY (Patient taking differently: Take 10 mg by mouth 2 (two) times daily.) 60 tablet 14  . metFORMIN (GLUCOPHAGE) 1000 MG tablet Take 1 tablet by mouth daily 90 tablet 1  . pravastatin (PRAVACHOL) 20 MG tablet TAKE 1 TABLET BY ORAL ROUTE EVERY DAY 30 tablet 2  . QUEtiapine (SEROQUEL) 25 MG tablet TAKE 1 TABLET (25 MG TOTAL) BY MOUTH AT BEDTIME AS NEEDED. 90 tablet 1  . rivastigmine (EXELON) 4.6 mg/24hr Place 1 patch (4.6 mg total) onto the skin daily. 30 patch 12  . senna-docusate (SENOKOT-S) 8.6-50 MG tablet Take 1 tablet by mouth as needed for mild constipation.    . TRULICITY 1.5 LZ/7.6BH SOPN INJECT 1.5 MG INTO THE SKIN ONCE A WEEK. (Patient taking differently: Inject 1.5 mg into the skin every Monday.) 12 mL 2  . COLCRYS 0.6 MG tablet Take 1 tablet (0.6 mg total) by mouth daily. (Patient not taking: Reported on 01/07/2021) 90 tablet 1  . loratadine (CLARITIN) 10 MG tablet Take 1 tablet (10 mg total) by mouth daily. 90 tablet 1   No facility-administered medications prior to  visit.   Radiology:   No results found.  Cardiac Studies:   Echocardiogram 04/28/2020:  1. Left ventricular ejection fraction, by estimation, is 50 to 55%. The  left ventricle has low normal function. The left ventricle has no regional  wall motion abnormalities. Left ventricular diastolic parameters are  consistent with Grade I diastolic  dysfunction (impaired relaxation).  2. Right ventricular systolic function is normal. The right ventricular  size is normal. There is moderately elevated pulmonary artery systolic pressure.  3. The mitral valve is normal in structure. Mild mitral  valve  regurgitation.  4. The aortic valve is tricuspid. Aortic valve regurgitation is mild.  Mild to moderate aortic valve sclerosis/calcification is present, without any evidence of aortic stenosis.  5.  Compared to the study done on 11/24/2016, moderate tricuspid regurgitation and moderate pulmonary hypertension not present, previously mild aortic stenosis reported.  Device Clinic: [facility] Ass[facility] MRI 07/11/2020:  Scheduled  In office pacemaker check 01/07/21  Single (S)/Dual (D)/BV: D. Presenting ASVP @ 63/min. Pacemaker dependant:  Yes. Underlying paced at 30 BPM. AP 13%, VP 100%. AMS Episodes 60 since 01/05//2021.  AT/AF burden <1% . Longest 1 hour 34 min on 01/09/2020.  HVR 1. Others brief AT ? AFL.  Long episode EGM not available. EMS shows NSVT for 14 seconds.  Longevity 8.5 Years. Magnet rate: >85%. Lead measurements: Stable. Histogram: Low (L)/normal (N)/high (H)  Normal. Patient activity: Minimal.   Observations: Normal pacemaker function. Changes: None.   I will continue to monitor him and have a low threshold to anticoagulate if he has AF episodes that are sustained.  Also since medication changes were made on his office visit previously, he has not had any further falls.  In view of risk of fall, he is not on anticoagulation.  Remote pacemaker check  Dual chamber [employer] MRI  09/21/2020: 28 AHR episodes, brief AT. < 1% atrial arrhythmia burden. No VHR episodes. Normal function. Battery longevity is 8.6 years. RA pacing is 12.0 %, RV pacing is >99.0 %.   EKG    EKG 01/07/2021: AV paced rhythm at rate of 61 bpm.  No further analysis.    EKG 06/08/2020: Underlying sinus rhythm with first-degree AV block at rate of 69 bpm, left atrial abnormality, ventricularly paced rhythm.  No further analysis.      Assessment     ICD-10-CM   1. Essential hypertension  I10 EKG 12-Lead  2. Pacemaker Dual chamber [employer] Jude Medical [employer] MRI  model [id number]  07/11/2020  Z95.0   3. Neurogenic orthostatic hypotension (HCC)  G90.3   4. 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HCC)  I44.2   5. DNR (do not resuscitate)  Z66     No orders of the defined types were placed in this encounter.   Medications Discontinued During This Encounter  Medication Reason  . loratadine (CLARITIN) 10 MG tablet Error   Recommendations:   Kentley Blyden. is a 80 y.o.  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  After diuresis, holding his blood pressure medications, serum creatinine improved, was discharged home and recommended outpatient evaluation.  He is accompanied by his wife today.  This is a 6 month visit. Patient is very slow to react and speaks very few words.  Denies any specific symptoms  or complaints.  Wife gives most of the history.  He is presently doing well and is remained stable and since discontinuing most of his antihypertensive medications except for carvedilol, he is not orthostatic anymore.  Although he is diabetic, I have not added ACE inhibitor's.  I reviewed his labs, renal function is normal, lipids are controlled and diabetes is relatively well controlled as well.  In view of advanced Parkinson's disease, dementia, patient is DNR and ACP documents are present in the chart.  No changes in the medications were done today.  Reviewed his pacemaker data again with the patient and his wife, they are having some issues with remote transmissions and will provide them a new transmitter.  In view of difficulty with mobility, I will change his visits to annual visit along with in person pacemaker check at the same time.    Adrian Prows, MD, [facility] 01/08/2021, 7:11 AM Office: [phone]

## 2021-01-11 ENCOUNTER — Telehealth: Payer: Medicare HMO

## 2021-01-11 ENCOUNTER — Telehealth: Payer: Self-pay

## 2021-01-11 NOTE — Telephone Encounter (Signed)
  Chronic Care Management   Outreach Note  01/11/2021 Name: Dylan [name]. MRN: [id number] DOB: 1941-09-08  Referred by: [name], MD Reason for referral : Chronic Care Management (RN CM FU Call Attempt )   An unsuccessful telephone outreach was attempted today. The patient was referred to the case management team for assistance with care management and care coordination.   Follow Up Plan: A HIPAA compliant phone message was left for the patient providing contact information and requesting a return call. Telephone follow up appointment with care management team member scheduled for: 02/03/21  [name], RN, BSN, CCM Care Management Coordinator [place] [facility]  Direct Phone: [phone]

## 2021-01-12 ENCOUNTER — Other Ambulatory Visit: Payer: Self-pay

## 2021-01-12 ENCOUNTER — Ambulatory Visit (INDEPENDENT_AMBULATORY_CARE_PROVIDER_SITE_OTHER): Payer: Medicare HMO | Admitting: Podiatry

## 2021-01-12 ENCOUNTER — Encounter: Payer: Self-pay | Admitting: Podiatry

## 2021-01-12 DIAGNOSIS — B351 Tinea unguium: Secondary | ICD-10-CM | POA: Diagnosis not present

## 2021-01-12 DIAGNOSIS — S90819A Abrasion, unspecified foot, initial encounter: Secondary | ICD-10-CM

## 2021-01-12 DIAGNOSIS — M79675 Pain in left toe(s): Secondary | ICD-10-CM

## 2021-01-12 DIAGNOSIS — M79674 Pain in right toe(s): Secondary | ICD-10-CM | POA: Diagnosis not present

## 2021-01-12 DIAGNOSIS — M2012 Hallux valgus (acquired), left foot: Secondary | ICD-10-CM

## 2021-01-12 DIAGNOSIS — L02611 Cutaneous abscess of right foot: Secondary | ICD-10-CM

## 2021-01-12 DIAGNOSIS — E1151 Type 2 diabetes mellitus with diabetic peripheral angiopathy without gangrene: Secondary | ICD-10-CM

## 2021-01-12 NOTE — Progress Notes (Signed)
Patient presented for foam casting for 3 pair custom diabetic shoe inserts. Patient is measured with a [place] Device to be a size 9.5 wide.  Diabetic shoes are chosen from the [employer] catalog. The shoes chosen are [phone]  The patient will be contacted when the shoes and the inserts are ready to be picked up.

## 2021-01-17 NOTE — Progress Notes (Signed)
Subjective: Dylan [name]. presents today for follow up of at risk foot care. Pt has h/o NIDDM with chronic kidney disease and PAD.  He also has painful mycotic nails b/l that are difficult to trim. Aggravating factors include wearing enclosed shoe gear. Pain is relieved with periodic professional debridement.  Dylan [name] is accompanied by his wife on today's visit.  His wife voices no new pedal problems on today's visit. His blood gluose is not monitored daily per wife.   No Known Allergies   Objective: There were no vitals filed for this visit.  Dylan [name] is a pleasant 80 y.o. year old African-American male in NAD. AAO x 3.   Vascular Examination:  Capillary fill time to digits delayed b/l lower extremities. Faintly palpable DP pulse(s) b/l lower extremities. Faintly palpable PT pulse(s) b/l lower extremities. Pedal hair absent. Lower extremity skin temperature gradient within normal limits.  Dermatological Examination: Pedal skin with normal turgor, texture and tone bilaterally. No open wounds bilaterally. No interdigital macerations bilaterally. Toenails 1-5 b/l elongated, dystrophic, thickened, crumbly with subungual debris and tenderness to dorsal palpation.   Musculoskeletal: Normal muscle strength 5/5 to all lower extremity muscle groups bilaterally. No pain crepitus or joint limitation noted with ROM b/l. Hallux valgus with bunion deformity noted left foot. Utilizes wheelchair for mobility assistance.  Neurological: Protective sensation intact 5/5 intact bilaterally with 10g monofilament b/l. Vibratory sensation intact b/l. Babinski reflex negative b/l. Clonus negative b/l.  Assessment: 1. Pain due to onychomycosis of toenails of both feet   2. Hallux valgus (acquired), left foot   3. Type II diabetes mellitus with peripheral circulatory disorder (HCC)    Plan: -Continue diabetic foot care principles. -No new findings. No new orders. -Toenails 1-5 b/l were debrided in  length and girth with sterile nail nippers and dremel without iatrogenic bleeding.  -Patient to continue soft, supportive shoe gear daily. -Patient to report any pedal injuries to medical professional immediately. -Patient/POA to call should there be question/concern in the interim.  Return in about 3 months (around 04/14/2021).

## 2021-01-18 ENCOUNTER — Ambulatory Visit (INDEPENDENT_AMBULATORY_CARE_PROVIDER_SITE_OTHER): Payer: Medicare HMO

## 2021-01-18 DIAGNOSIS — F0391 Unspecified dementia with behavioral disturbance: Secondary | ICD-10-CM | POA: Diagnosis not present

## 2021-01-18 DIAGNOSIS — G20 Parkinson's disease: Secondary | ICD-10-CM

## 2021-01-18 DIAGNOSIS — N1831 Chronic kidney disease, stage 3a: Secondary | ICD-10-CM | POA: Diagnosis not present

## 2021-01-18 DIAGNOSIS — N183 Chronic kidney disease, stage 3 unspecified: Secondary | ICD-10-CM | POA: Diagnosis not present

## 2021-01-18 DIAGNOSIS — E1122 Type 2 diabetes mellitus with diabetic chronic kidney disease: Secondary | ICD-10-CM | POA: Diagnosis not present

## 2021-01-18 NOTE — Patient Instructions (Signed)
Social Worker Visit Information  Goals we discussed today:  Goals Addressed            This Visit's Progress   . Disease Progression Managed with Care Coordination       Timeframe:  Long-Range Goal Priority:  High Start Date:  2.21.22                           Expected End Date: 6.21.22                       Next planned outreach: 3.28.22  Patient Goals/Self-Care Activities Over the next 30 days, patient will: With the help of his caregiver [name]  - Patient will self administer medications as prescribed Patient will attend all scheduled provider appointments Patient will call provider office for new concerns or questions Contact SW as needed prior to next scheduled call        Materials Provided: Verbal education about resources provided by phone  Follow Up Plan: SW will follow up with patient by phone over the next week.   Daneen Schick, BSW, CDP Social Worker, Certified Dementia Practitioner [place] [facility] [place] [facility] [phone]
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
## 2021-01-19 ENCOUNTER — Telehealth: Payer: Self-pay | Admitting: Nurse Practitioner

## 2021-01-19 NOTE — Telephone Encounter (Signed)
Spoke with wife, [name], to see if we could reschedule the 02/02/21 in-home Palliative f/u visit to a telehealth visit on 02/10/21 @ 9 AM, if patient was doing okay, and she was in agreement with this and she said that patient is doing about the same.

## 2021-01-25 ENCOUNTER — Ambulatory Visit: Payer: Medicare HMO | Admitting: Neurology

## 2021-01-25 ENCOUNTER — Ambulatory Visit: Payer: Medicare HMO

## 2021-01-25 DIAGNOSIS — N1831 Chronic kidney disease, stage 3a: Secondary | ICD-10-CM | POA: Diagnosis not present

## 2021-01-25 DIAGNOSIS — E1122 Type 2 diabetes mellitus with diabetic chronic kidney disease: Secondary | ICD-10-CM | POA: Diagnosis not present

## 2021-01-25 DIAGNOSIS — F0391 Unspecified dementia with behavioral disturbance: Secondary | ICD-10-CM

## 2021-01-25 DIAGNOSIS — N183 Chronic kidney disease, stage 3 unspecified: Secondary | ICD-10-CM

## 2021-01-25 DIAGNOSIS — G20 Parkinson's disease: Secondary | ICD-10-CM

## 2021-01-25 NOTE — Chronic Care Management (AMB) (Signed)
Chronic Care Management    Social Work Note  01/25/2021 Name: Dylan [name]. MRN: [id number] DOB: 09/18/1941  Dylan [name]. is a 80 y.o. year old male who is a primary care patient of [name], MD. The CCM team was consulted to assist the patient with chronic disease management and/or care coordination needs related to: Level of Care Concerns.   Engaged with patients spouse by phone for follow up visit in response to provider referral for social work chronic care management and care coordination services.   Consent to Services:  The patient was given information about Chronic Care Management services, agreed to services, and gave verbal consent prior to initiation of services.  Please see initial visit note for detailed documentation.   Patient agreed to services and consent obtained.   Assessment: Review of patient past medical history, allergies, medications, and health status, including review of relevant consultants reports was performed today as part of a comprehensive evaluation and provision of chronic care management and care coordination services.     SDOH (Social Determinants of Health) assessments and interventions performed:    Advanced Directives Status: Not addressed in this encounter.  CCM Care Plan  No Known Allergies  Outpatient Encounter Medications as of 01/25/2021  Medication Sig  . Ascorbic Acid (VITAMIN C) 1000 MG tablet Take 1,000 mg by mouth every morning.   . carbidopa-levodopa (SINEMET IR) 25-100 MG tablet Take 2 tablets by mouth 3 (three) times daily.  . carvedilol (COREG) 12.5 MG tablet TAKE 1 TABLET BY MOUTH 2 TIMES DAILY. (Patient taking differently: Take 12.5 mg by mouth 2 (two) times daily with a meal.)  . Cholecalciferol (VITAMIN D3) 5000 units CAPS Take 5,000 Units by mouth daily with supper.   . desonide (DESOWEN) 0.05 % lotion Apply topically 2 times per day prn  . diclofenac Sodium (VOLTAREN) 1 % GEL Apply 2 g topically 3 (three)  times daily. (Patient taking differently: Apply 2 g topically as needed.)  . 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 Lancet Devices (ONE TOUCH DELICA LANCING DEV) MISC Please fill ONE TOUCH DELICA LANCING DEVICE.  Use to test blood sugar twice daily as directed. E11.65 (Patient taking differently: Please fill ONE TOUCH DELICA LANCING DEVICE.  Use to test blood sugar twice daily as directed. E11.65)  . Lancets MISC Please fill basic/generic push button lancets.  Patient to test blood sugar twice daily. DX: E11.65  . linaclotide (LINZESS) 72 MCG capsule Take 1 capsule (72 mcg total) by mouth daily before breakfast.  . Melatonin 10 MG TABS Take 20 mg by mouth at bedtime.  . memantine (NAMENDA) 10 MG tablet TAKE 1 TABLET BY MOUTH TWICE A DAY (Patient taking differently: Take 10 mg by mouth 2 (two) times daily.)  . metFORMIN (GLUCOPHAGE) 1000 MG tablet Take 1 tablet by mouth daily  . pravastatin (PRAVACHOL) 20 MG tablet TAKE 1 TABLET BY ORAL ROUTE EVERY DAY  . QUEtiapine (SEROQUEL) 25 MG tablet TAKE 1 TABLET (25 MG TOTAL) BY MOUTH AT BEDTIME AS NEEDED.  . rivastigmine (EXELON) 4.6 mg/24hr Place 1 patch (4.6 mg total) onto the skin daily.  [name] senna-docusate (SENOKOT-S) 8.6-50 MG tablet Take 1 tablet by mouth as needed for mild constipation.  . TRULICITY 1.5 XT/0.2IO SOPN INJECT 1.5 MG INTO THE SKIN ONCE A WEEK. (Patient taking differently: Inject 1.5 mg into the skin every Monday.)   No facility-administered encounter medications on file as of 01/25/2021.  Patient Active Problem List   Diagnosis Date Noted  . Chronic constipation 12/24/2020  . Parkinson's disease ([place]) 12/24/2020  . Encounter for care of pacemaker 07/11/2020  . 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 Neurogenic orthostatic hypotension ([place]) 06/08/2020  . DNR (do not resuscitate) 06/08/2020  . Elevated CK 04/27/2020  .  Gout   . Hyperlipemia   . Parkinsonism ([place]) 01/29/2020  . 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 Dementia with behavioral disturbance ([place]) 05/22/2019  . Gait abnormality 05/22/2019  . AKI (acute kidney injury) ([place]) 07/14/2018  . Nephropathy 07/14/2018  . Other disorders of lung 07/14/2018  . Atherosclerosis of aorta ([place]) 07/14/2018  . Dehydration 07/14/2018  . 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 CKD (chronic kidney disease), stage III ([place]) 06/13/2018  . Syncope 06/13/2018  . 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 Peripheral arterial disease ([place]) 05/13/2014  . Essential hypertension 05/13/2014  . Type 2 diabetes mellitus with stage 3a chronic kidney disease, without long-term current use of insulin ([place]) 05/13/2014    Conditions to be addressed/monitored: DMII, CKD Stage III, Dementia and Parkinsons; Level of care concerns  Care Plan : Social Work Care Plan  Updates made by Daneen Schick since 01/25/2021 12:00 AM    Problem: Disease Progression     Long-Range Goal: Disease Progression Managed with Care Coordination   Start Date: 12/21/2020  Expected End Date: 04/20/2021  This Visit's Progress: On track  Recent Progress: On track  Priority: High  Note:   Current Barriers:  . Level of care concerns . Recent Diagnosis of Prostate Cancer  . Chronic conditions including DM II, CKD III, Dementia, and Parkinson's disease which put patient at increased risk of hospitaliation . Unable to perform ADLs independently . Unable to perform IADLs independently  Social Work Clinical Goal(s):  [name] Over the next 120 days the patient and his spouse, [name], will work with SW to address care coordination needs related to disease progression  Interventions: . 1:1 collaboration with [name], MD regarding development and update of comprehensive plan of care as evidenced by provider attestation and  co-signature . Inter-disciplinary care team collaboration (see longitudinal plan of care) . Successful outbound call placed to [place] with in home aid program . Discussed patient spouse desire to link patient to [facility] [facility] if funding is available . Determined Mrs. Hunt will have to check to see if funding is available at this time and will contact [name] with an update . Successful outbound call placed to [name] to provide an update on referral . Scheduled follow up call over the next 10 days  Patient Goals/Self-Care Activities Over the next 30 days, patient will: With the help of his caregiver [name]  - Patient will self administer medications as prescribed Patient will attend all scheduled provider appointments Patient will call provider office for new concerns or questions Contact SW as needed prior to next scheduled call Engage with Phillips Hay to complete screening for adult day program funding  Follow up Plan: SW will follow up with patient by phone over the next 10 days       Follow Up Plan: SW will follow up with patient by phone over the next 10 days      [place], CDP Social Worker, Certified Dementia Practitioner [facility] [phone]  Total  time spent performing care coordination and/or care management activities with the patient by phone or face to face = 17 minutes.

## 2021-01-25 NOTE — Patient Instructions (Signed)
Social Worker Visit Information  Goals we discussed today:  Goals Addressed            This Visit's Progress   . Disease Progression Managed with Care Coordination   On track    Timeframe:  Long-Range Goal Priority:  High Start Date:  2.21.22                           Expected End Date: 6.21.22                       Next planned outreach: 4.7.22  Patient Goals/Self-Care Activities Over the next 30 days, patient will: With the help of his caregiver [name]  - Patient will self administer medications as prescribed Patient will attend all scheduled provider appointments Patient will call provider office for new concerns or questions Contact SW as needed prior to next scheduled call Engage with Phillips Hay to complete screening for adult day program funding        Materials Provided: Yes: Verbal education about funding referral process for adult day program  Follow Up Plan: SW will follow up with patient by phone over the next 10 days   [place], CDP Social Worker, Certified Dementia Practitioner [place] [facility] [place] [facility] [phone]

## 2021-01-27 ENCOUNTER — Telehealth: Payer: Self-pay | Admitting: Cardiology

## 2021-01-28 NOTE — Telephone Encounter (Signed)
Please f/u on this.

## 2021-01-28 NOTE — Telephone Encounter (Signed)
Spoke with patient wife and she recently received his new transmitter and has it plugged up, so she is not sure why it is not transmitting. She is going to contact the company today to troubleshoot.

## 2021-01-29 NOTE — Telephone Encounter (Signed)
I spoke with patient wife, she said she contacted [employer] and they said everything is working.

## 2021-01-29 NOTE — Telephone Encounter (Signed)
Patient recently received his new transmitter, per [employer], device reconnected 01/29/2021.

## 2021-02-01 ENCOUNTER — Ambulatory Visit: Payer: Medicare HMO | Admitting: Internal Medicine

## 2021-02-02 ENCOUNTER — Other Ambulatory Visit: Payer: Medicare HMO | Admitting: Nurse Practitioner

## 2021-02-03 ENCOUNTER — Other Ambulatory Visit: Payer: Self-pay

## 2021-02-03 ENCOUNTER — Ambulatory Visit (INDEPENDENT_AMBULATORY_CARE_PROVIDER_SITE_OTHER): Payer: Medicare HMO | Admitting: Nurse Practitioner

## 2021-02-03 ENCOUNTER — Telehealth: Payer: Medicare HMO

## 2021-02-03 VITALS — BP 142/80 | HR 76 | Temp 97.5°F

## 2021-02-03 DIAGNOSIS — F0391 Unspecified dementia with behavioral disturbance: Secondary | ICD-10-CM | POA: Diagnosis not present

## 2021-02-03 DIAGNOSIS — E538 Deficiency of other specified B group vitamins: Secondary | ICD-10-CM

## 2021-02-03 DIAGNOSIS — G20A1 Parkinson's disease without dyskinesia, without mention of fluctuations: Secondary | ICD-10-CM

## 2021-02-03 DIAGNOSIS — G20 Parkinson's disease: Secondary | ICD-10-CM

## 2021-02-03 DIAGNOSIS — N1831 Type 2 diabetes mellitus with diabetic chronic kidney disease: Secondary | ICD-10-CM

## 2021-02-03 DIAGNOSIS — E1122 Type 2 diabetes mellitus with diabetic chronic kidney disease: Secondary | ICD-10-CM | POA: Diagnosis not present

## 2021-02-03 DIAGNOSIS — N183 Chronic kidney disease, stage 3 unspecified: Secondary | ICD-10-CM

## 2021-02-03 DIAGNOSIS — R269 Unspecified abnormalities of gait and mobility: Secondary | ICD-10-CM

## 2021-02-03 DIAGNOSIS — I129 Hypertensive chronic kidney disease with stage 1 through stage 4 chronic kidney disease, or unspecified chronic kidney disease: Secondary | ICD-10-CM | POA: Diagnosis not present

## 2021-02-03 NOTE — Progress Notes (Signed)
Rutherford Nail as a Education administrator for [employer], NP.,have documented all relevant documentation on the behalf of [employer], NP,as directed by  Bary Castilla, NP while in the presence of Bary Castilla, NP. This visit occurred during the SARS-CoV-2 public health emergency.  Safety protocols were in place, including screening questions prior to the visit, additional usage of staff PPE, and extensive cleaning of exam room while observing appropriate contact time as indicated for disinfecting solutions.  Subjective:     Patient ID: Dylan [name]. , male    DOB: [birth year] , 80 y.o.   MRN: [id number]   Chief Complaint  Patient presents with  . Diabetes  . Hypertension    HPI  Pt is here today for f/u on diabetes and hypertension he is compliant with all medications. Pt wife would like to speak about home assistance for the pt and some PT. The wife is having a hard time ambulating the patient at home and requests that someone comes to the house to help him with that or to build up his strength. The patient is also being followed by neurologist. The patient had protein in his urine last time. We will check again and if there is still some we will refer him to the nephrologist. He has a pace-maker. His last eye exam was this year. The patient was unable to urinate at this visit and the patients wife stated she will drop of his urine from home.   Diabetes Pertinent negatives for hypoglycemia include no dizziness or headaches. Associated symptoms include weakness. Pertinent negatives for diabetes include no fatigue, no polydipsia, no polyphagia and no polyuria.  Hypertension Pertinent negatives include no headaches or shortness of breath.     Past Medical History:  Diagnosis Date  . Anxiety   . Aortic stenosis    mild AS 11/24/16 (peak grad 24, mean grad 11) Dr. [name]  . CHB (complete heart block) ([place]) 07/2017  . 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 Diabetes mellitus without complication ([place])   . Encounter for care of pacemaker 07/11/2020  . Gait abnormality   . Gout   . Hyperlipemia   . Hypertension   . 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 Memory loss   . Pacemaker Dual chamber [employer] MRI  model [id number] 07/11/2020 06/08/2020  . Peripheral arterial disease ([place])   . 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 Second degree AV block    Wenckebach; no indication for pacemaker as of 12/01/16 (Dr. [name])  . Vitamin B12 deficiency anemia 03/01/2018  . Vitamin D deficiency disease      Family History  Problem Relation Age of Onset  . Diabetes Mother   . Breast cancer Mother   . Arthritis Mother   . Hypertension Father   . Diabetes Sister   . Diabetes Brother   . Diabetes Maternal Grandmother   . Diabetes Brother   . Diabetes Brother   . Diabetes Brother   . Diabetes Sister   . Diabetes Sister   . Diabetes Sister      Current Outpatient Medications:  .  Ascorbic Acid (VITAMIN C) 1000 MG tablet, Take 1,000 mg by mouth every morning. , Disp: , Rfl:  .  carbidopa-levodopa (SINEMET IR) 25-100 MG tablet, Take 2 tablets by mouth 3 (three) times daily., Disp: 180 tablet, Rfl: 11 .  carvedilol (COREG) 12.5 MG tablet, TAKE 1 TABLET BY MOUTH  2 TIMES DAILY. (Patient taking differently: Take 12.5 mg by mouth 2 (two) times daily with a meal.), Disp: 60 tablet, Rfl: 11 .  Cholecalciferol (VITAMIN D3) 5000 units CAPS, Take 5,000 Units by mouth daily with supper. , Disp: , Rfl:  .  desonide (DESOWEN) 0.05 % lotion, Apply topically 2 times per day prn, Disp: 59 mL, Rfl: 1 .  diclofenac Sodium (VOLTAREN) 1 % GEL, Apply 2 g topically 3 (three) times daily. (Patient taking differently: Apply 2 g topically as needed.), Disp: 150 g, Rfl: 0 .  glucose blood (ONETOUCH VERIO) test strip, Use as instructed to check blood sugars daily dx: e11.22, Disp: 150 each, Rfl: 3 .  Lancet  Devices (ONE TOUCH DELICA LANCING DEV) MISC, Please fill ONE TOUCH DELICA LANCING DEVICE.  Use to test blood sugar twice daily as directed. E11.65 (Patient taking differently: Please fill ONE TOUCH DELICA LANCING DEVICE.  Use to test blood sugar twice daily as directed. E11.65), Disp: 1 each, Rfl: 1 .  Lancets MISC, Please fill basic/generic push button lancets.  Patient to test blood sugar twice daily. DX: E11.65, Disp: 100 each, Rfl: 4 .  Melatonin 10 MG TABS, Take 20 mg by mouth at bedtime., Disp: , Rfl:  .  memantine (NAMENDA) 10 MG tablet, TAKE 1 TABLET BY MOUTH TWICE A DAY (Patient taking differently: Take 10 mg by mouth 2 (two) times daily.), Disp: 60 tablet, Rfl: 14 .  metFORMIN (GLUCOPHAGE) 1000 MG tablet, Take 1 tablet by mouth daily, Disp: 90 tablet, Rfl: 1 .  pravastatin (PRAVACHOL) 20 MG tablet, TAKE 1 TABLET BY ORAL ROUTE EVERY DAY, Disp: 30 tablet, Rfl: 2 .  rivastigmine (EXELON) 4.6 mg/24hr, Place 1 patch (4.6 mg total) onto the skin daily., Disp: 30 patch, Rfl: 12 .  senna-docusate (SENOKOT-S) 8.6-50 MG tablet, Take 1 tablet by mouth as needed for mild constipation., Disp: , Rfl:  .  TRULICITY 1.5 GB/2.0FE SOPN, INJECT 1.5 MG INTO THE SKIN ONCE A WEEK. (Patient taking differently: Inject 1.5 mg into the skin every Monday.), Disp: 12 mL, Rfl: 2 .  linaclotide (LINZESS) 72 MCG capsule, Take 1 capsule (72 mcg total) by mouth daily before breakfast. (Patient not taking: Reported on 02/03/2021), Disp: 30 capsule, Rfl: 0 .  QUEtiapine (SEROQUEL) 25 MG tablet, TAKE 1 TABLET (25 MG TOTAL) BY MOUTH AT BEDTIME AS NEEDED. (Patient not taking: Reported on 02/03/2021), Disp: 90 tablet, Rfl: 1   No Known Allergies   Review of Systems  Constitutional: Negative.  Negative for fatigue.  HENT: Negative.   Eyes: Negative for visual disturbance.  Respiratory: Negative for cough, chest tightness and shortness of breath.   Endocrine: Negative for polydipsia, polyphagia and polyuria.  Musculoskeletal:  Negative.   Skin: Negative.   Neurological: Positive for weakness. Negative for dizziness, facial asymmetry and headaches.  Psychiatric/Behavioral: Negative.     Today's Vitals   02/03/21 1428  BP: (!) 142/80  Pulse: 76  Temp: (!) 97.5 F (36.4 C)  TempSrc: Oral   There is no height or weight on file to calculate BMI.   Objective:  Physical Exam Vitals reviewed.  Constitutional:      General: He is not in acute distress.    Appearance: Normal appearance.  HENT:     Head: Normocephalic and atraumatic.  Eyes:     Extraocular Movements: Extraocular movements intact.     Conjunctiva/sclera: Conjunctivae normal.     Pupils: Pupils are equal, round, and reactive to light.  Cardiovascular:  Rate and Rhythm: Normal rate and regular rhythm.     Pulses: Normal pulses.     Heart sounds: Normal heart sounds. No murmur heard.   Pulmonary:     Effort: Pulmonary effort is normal. No respiratory distress.     Breath sounds: Normal breath sounds. No wheezing.  Skin:    General: Skin is warm and dry.     Capillary Refill: Capillary refill takes less than 2 seconds.  Neurological:     Mental Status: He is alert. Mental status is at baseline.     Cranial Nerves: No cranial nerve deficit.     Motor: Weakness present.     Gait: Gait abnormal.     Comments: Shuffling gait          Assessment And Plan:     1. Type 2 diabetes mellitus with stage 3a chronic kidney disease, without long-term current use of insulin ([place]) -Dicussed with the patient and his wife the importance of glycemic control  -Chronic, continue meds.  - Hemoglobin A1c today   2. Stage 3 chronic kidney disease, unspecified whether stage 3a or 3b CKD ([facility]) -Chronic, stable  -Continue meds  - CMP14+EGFR - CBC no Diff  3. Hypertensive nephropathy -Chronic, controlled. Managed by cardiology.  -Encouraged low sodium diet   4. Dementia with behavioral disturbance, unspecified dementia type ([place]) -Followed by  neurologist Dr. Krista Blue   5. Parkinson's disease ([place]) -Followed by neurologist Dr. Krista Blue  -Continue with his meds  -Follow up with Dr. Krista Blue  -Referral to home health due to his gait   6. Vitamin B12 deficiency - Will check Vitamin B12  7. Gait abnormality -Wife is having a hard time with moving and transferring the patient at home due to his decline with his mental status and parkinson's disease.  -Shuffling gait  - Ambulatory referral to [facility]     Patient was given opportunity to ask questions. Patient verbalized understanding of the plan and was able to repeat key elements of the plan. All questions were answered to their satisfaction.  Bary Castilla, NP   I, Bary Castilla, NP, have reviewed all documentation for this visit. The documentation on 02/03/21 for the exam, diagnosis, procedures, and orders are all accurate and complete.   IF YOU HAVE BEEN REFERRED TO A SPECIALIST, IT MAY TAKE 1-2 WEEKS TO SCHEDULE/PROCESS THE REFERRAL. IF YOU HAVE NOT HEARD FROM US/SPECIALIST IN TWO WEEKS, PLEASE GIVE [place] A CALL AT [phone].   THE PATIENT IS ENCOURAGED TO PRACTICE SOCIAL DISTANCING DUE TO THE COVID-19 PANDEMIC.

## 2021-02-03 NOTE — Patient Instructions (Signed)
Diabetes Mellitus and Exercise Exercising regularly is important for overall health, especially for people who have diabetes mellitus. Exercising is not only about losing weight. It has many other health benefits, such as increasing muscle strength and bone density and reducing body fat and stress. This leads to improved fitness, flexibility, and endurance, all of which result in better overall health. What are the benefits of exercise if I have diabetes? Exercise has many benefits for people with diabetes. They include:  Helping to lower and control blood sugar (glucose).  Helping the body to respond better to the hormone insulin by improving insulin sensitivity.  Reducing how much insulin the body needs.  Lowering the risk for heart disease by: ? Lowering "bad" cholesterol and triglyceride levels. ? Increasing "good" cholesterol levels. ? Lowering blood pressure. ? Lowering blood glucose levels. What is my activity plan? Your health care provider or certified diabetes educator can help you make a plan for the type and frequency of exercise that works for you. This is called your activity plan. Be sure to:  Get at least 150 minutes of medium-intensity or high-intensity exercise each week. Exercises may include brisk walking, biking, or water aerobics.  Do stretching and strengthening exercises, such as yoga or weight lifting, at least 2 times a week.  Spread out your activity over at least 3 days of the week.  Get some form of physical activity each day. ? Do not go more than 2 days in a row without some kind of physical activity. ? Avoid being inactive for more than 90 minutes at a time. Take frequent breaks to walk or stretch.  Choose exercises or activities that you enjoy. Set realistic goals.  Start slowly and gradually increase your exercise intensity over time.   How do I manage my diabetes during exercise? Monitor your blood glucose  Check your blood glucose before and  after exercising. If your blood glucose is: ? 240 mg/dL (13.3 mmol/L) or higher before you exercise, check your urine for ketones. These are chemicals created by the liver. If you have ketones in your urine, do not exercise until your blood glucose returns to normal. ? 100 mg/dL (5.6 mmol/L) or lower, eat a snack containing 15-20 grams of carbohydrate. Check your blood glucose 15 minutes after the snack to make sure that your glucose level is above 100 mg/dL (5.6 mmol/L) before you start your exercise.  Know the symptoms of low blood glucose (hypoglycemia) and how to treat it. Your risk for hypoglycemia increases during and after exercise. Follow these tips and your health care provider's instructions  Keep a carbohydrate snack that is fast-acting for use before, during, and after exercise to help prevent or treat hypoglycemia.  Avoid injecting insulin into areas of the body that are going to be exercised. For example, avoid injecting insulin into: ? Your arms, when you are about to play tennis. ? Your legs, when you are about to go jogging.  Keep records of your exercise habits. Doing this can help you and your health care provider adjust your diabetes management plan as needed. Write down: ? Food that you eat before and after you exercise. ? Blood glucose levels before and after you exercise. ? The type and amount of exercise you have done.  Work with your health care provider when you start a new exercise or activity. He or she may need to: ? Make sure that the activity is safe for you. ? Adjust your insulin, other medicines, and food that   you eat.  Drink plenty of water while you exercise. This prevents loss of water (dehydration) and problems caused by a lot of heat in the body (heat stroke).   Where to find more information  American Diabetes Association: www.diabetes.org Summary  Exercising regularly is important for overall health, especially for people who have diabetes  mellitus.  Exercising has many health benefits. It increases muscle strength and bone density and reduces body fat and stress. It also lowers and controls blood glucose.  Your health care provider or certified diabetes educator can help you make an activity plan for the type and frequency of exercise that works for you.  Work with your health care provider to make sure any new activity is safe for you. Also work with your health care provider to adjust your insulin, other medicines, and the food you eat. This information is not intended to replace advice given to you by your health care provider. Make sure you discuss any questions you have with your health care provider. Document Revised: 07/15/2019 Document Reviewed: 07/15/2019 Elsevier Patient Education  2021 Elsevier Inc. Hypertension, Adult Hypertension is another name for high blood pressure. High blood pressure forces your heart to work harder to pump blood. This can cause problems over time. There are two numbers in a blood pressure reading. There is a top number (systolic) over a bottom number (diastolic). It is best to have a blood pressure that is below 120/80. Healthy choices can help lower your blood pressure, or you may need medicine to help lower it. What are the causes? The cause of this condition is not known. Some conditions may be related to high blood pressure. What increases the risk?  Smoking.  Having type 2 diabetes mellitus, high cholesterol, or both.  Not getting enough exercise or physical activity.  Being overweight.  Having too much fat, sugar, calories, or salt (sodium) in your diet.  Drinking too much alcohol.  Having long-term (chronic) kidney disease.  Having a family history of high blood pressure.  Age. Risk increases with age.  Race. You may be at higher risk if you are African American.  Gender. Men are at higher risk than women before age 45. After age 65, women are at higher risk than  men.  Having obstructive sleep apnea.  Stress. What are the signs or symptoms?  High blood pressure may not cause symptoms. Very high blood pressure (hypertensive crisis) may cause: ? Headache. ? Feelings of worry or nervousness (anxiety). ? Shortness of breath. ? Nosebleed. ? A feeling of being sick to your stomach (nausea). ? Throwing up (vomiting). ? Changes in how you see. ? Very bad chest pain. ? Seizures. How is this treated?  This condition is treated by making healthy lifestyle changes, such as: ? Eating healthy foods. ? Exercising more. ? Drinking less alcohol.  Your health care provider may prescribe medicine if lifestyle changes are not enough to get your blood pressure under control, and if: ? Your top number is above 130. ? Your bottom number is above 80.  Your personal target blood pressure may vary. Follow these instructions at home: Eating and drinking  If told, follow the DASH eating plan. To follow this plan: ? Fill one half of your plate at each meal with fruits and vegetables. ? Fill one fourth of your plate at each meal with whole grains. Whole grains include whole-wheat pasta, brown rice, and whole-grain bread. ? Eat or drink low-fat dairy products, such as skim milk or   low-fat yogurt. ? Fill one fourth of your plate at each meal with low-fat (lean) proteins. Low-fat proteins include fish, chicken without skin, eggs, beans, and tofu. ? Avoid fatty meat, cured and processed meat, or chicken with skin. ? Avoid pre-made or processed food.  Eat less than 1,500 mg of salt each day.  Do not drink alcohol if: ? Your doctor tells you not to drink. ? You are pregnant, may be pregnant, or are planning to become pregnant.  If you drink alcohol: ? Limit how much you use to:  0-1 drink a day for women.  0-2 drinks a day for men. ? Be aware of how much alcohol is in your drink. In the [place] one drink equals one 12 oz bottle of beer (355 mL), one 5 oz glass  of wine (148 mL), or one 1 oz glass of hard liquor (44 mL).   Lifestyle  Work with your doctor to stay at a healthy weight or to lose weight. Ask your doctor what the best weight is for you.  Get at least 30 minutes of exercise most days of the week. This may include walking, swimming, or biking.  Get at least 30 minutes of exercise that strengthens your muscles (resistance exercise) at least 3 days a week. This may include lifting weights or doing Pilates.  Do not use any products that contain nicotine or tobacco, such as cigarettes, e-cigarettes, and chewing tobacco. If you need help quitting, ask your doctor.  Check your blood pressure at home as told by your doctor.  Keep all follow-up visits as told by your doctor. This is important.   Medicines  Take over-the-counter and prescription medicines only as told by your doctor. Follow directions carefully.  Do not skip doses of blood pressure medicine. The medicine does not work as well if you skip doses. Skipping doses also puts you at risk for problems.  Ask your doctor about side effects or reactions to medicines that you should watch for. Contact a doctor if you:  Think you are having a reaction to the medicine you are taking.  Have headaches that keep coming back (recurring).  Feel dizzy.  Have swelling in your ankles.  Have trouble with your vision. Get help right away if you:  Get a very bad headache.  Start to feel mixed up (confused).  Feel weak or numb.  Feel faint.  Have very bad pain in your: ? Chest. ? Belly (abdomen).  Throw up more than once.  Have trouble breathing. Summary  Hypertension is another name for high blood pressure.  High blood pressure forces your heart to work harder to pump blood.  For most people, a normal blood pressure is less than 120/80.  Making healthy choices can help lower blood pressure. If your blood pressure does not get lower with healthy choices, you may need to  take medicine. This information is not intended to replace advice given to you by your health care provider. Make sure you discuss any questions you have with your health care provider. Document Revised: 06/27/2018 Document Reviewed: 06/27/2018 Elsevier Patient Education  2021 Elsevier Inc.  

## 2021-02-04 ENCOUNTER — Telehealth: Payer: Medicare HMO

## 2021-02-04 ENCOUNTER — Telehealth: Payer: Self-pay

## 2021-02-04 LAB — HEMOGLOBIN A1C
Est. average glucose Bld gHb Est-mCnc: 143 mg/dL
Hgb A1c MFr Bld: 6.6 % — ABNORMAL HIGH (ref 4.8–5.6)

## 2021-02-04 LAB — CBC
Hematocrit: 39.8 % (ref 37.5–51.0)
Hemoglobin: 13.6 g/dL (ref 13.0–17.7)
MCH: 29.2 pg (ref 26.6–33.0)
MCHC: 34.2 g/dL (ref 31.5–35.7)
MCV: 85 fL (ref 79–97)
Platelets: 172 10*3/uL (ref 150–450)
RBC: 4.66 x10E6/uL (ref 4.14–5.80)
RDW: 13.1 % (ref 11.6–15.4)
WBC: 3.3 10*3/uL — ABNORMAL LOW (ref 3.4–10.8)

## 2021-02-04 LAB — CMP14+EGFR
ALT: 7 IU/L (ref 0–44)
AST: 12 IU/L (ref 0–40)
Albumin/Globulin Ratio: 1.4 (ref 1.2–2.2)
Albumin: 4.2 g/dL (ref 3.7–4.7)
Alkaline Phosphatase: 119 IU/L (ref 44–121)
BUN/Creatinine Ratio: 14 (ref 10–24)
BUN: 14 mg/dL (ref 8–27)
Bilirubin Total: 0.3 mg/dL (ref 0.0–1.2)
CO2: 24 mmol/L (ref 20–29)
Calcium: 10 mg/dL (ref 8.6–10.2)
Chloride: 102 mmol/L (ref 96–106)
Creatinine, Ser: 1.01 mg/dL (ref 0.76–1.27)
Globulin, Total: 3.1 g/dL (ref 1.5–4.5)
Glucose: 157 mg/dL — ABNORMAL HIGH (ref 65–99)
Potassium: 4.2 mmol/L (ref 3.5–5.2)
Sodium: 144 mmol/L (ref 134–144)
Total Protein: 7.3 g/dL (ref 6.0–8.5)
eGFR: 76 mL/min/{1.73_m2} (ref >59)

## 2021-02-04 LAB — VITAMIN B12: Vitamin B-12: 616 pg/mL (ref 232–1245)

## 2021-02-04 NOTE — Telephone Encounter (Addendum)
  Chronic Care Management   Outreach Note  02/04/2021 Name: Dylan [name]. MRN: [id number] DOB: [birth year]  Referred by: [name], MD Reason for referral : Chronic Care Management (Unsuccessful call)   An unsuccessful telephone outreach was attempted today. The patient was referred to the case management team for assistance with care management and care coordination.   Follow Up Plan: Unable to leave a voice message due to the voice mailbox being full. The care management team will reach out to the patient again over the next 21 days.   Daneen Schick, BSW, CDP Social Worker, Certified Dementia Practitioner [place] [facility] [place] [facility] [phone]

## 2021-02-09 ENCOUNTER — Telehealth: Payer: Medicare HMO

## 2021-02-09 ENCOUNTER — Emergency Department (HOSPITAL_COMMUNITY): Payer: Medicare HMO

## 2021-02-09 ENCOUNTER — Other Ambulatory Visit: Payer: Self-pay

## 2021-02-09 ENCOUNTER — Observation Stay (HOSPITAL_COMMUNITY)
Admission: EM | Admit: 2021-02-09 | Discharge: 2021-02-12 | Disposition: A | Discharge status: Home / Self Care | Payer: Medicare HMO | Attending: Family Medicine | Admitting: Family Medicine

## 2021-02-09 DIAGNOSIS — E1122 Type 2 diabetes mellitus with diabetic chronic kidney disease: Secondary | ICD-10-CM | POA: Diagnosis not present

## 2021-02-09 DIAGNOSIS — Z87891 Personal history of nicotine dependence: Secondary | ICD-10-CM | POA: Diagnosis not present

## 2021-02-09 DIAGNOSIS — R29818 Other symptoms and signs involving the nervous system: Secondary | ICD-10-CM

## 2021-02-09 DIAGNOSIS — F0281 Dementia in other diseases classified elsewhere with behavioral disturbance: Secondary | ICD-10-CM | POA: Diagnosis not present

## 2021-02-09 DIAGNOSIS — R4182 Altered mental status, unspecified: Secondary | ICD-10-CM

## 2021-02-09 DIAGNOSIS — Z950 Presence of cardiac pacemaker: Secondary | ICD-10-CM | POA: Diagnosis not present

## 2021-02-09 DIAGNOSIS — Z7984 Long term (current) use of oral hypoglycemic drugs: Secondary | ICD-10-CM | POA: Insufficient documentation

## 2021-02-09 DIAGNOSIS — Z79899 Other long term (current) drug therapy: Secondary | ICD-10-CM | POA: Insufficient documentation

## 2021-02-09 DIAGNOSIS — G20 Parkinson's disease: Secondary | ICD-10-CM | POA: Diagnosis not present

## 2021-02-09 DIAGNOSIS — R531 Weakness: Secondary | ICD-10-CM | POA: Diagnosis present

## 2021-02-09 DIAGNOSIS — R55 Syncope and collapse: Secondary | ICD-10-CM | POA: Diagnosis not present

## 2021-02-09 DIAGNOSIS — I480 Paroxysmal atrial fibrillation: Secondary | ICD-10-CM | POA: Diagnosis not present

## 2021-02-09 DIAGNOSIS — I129 Hypertensive chronic kidney disease with stage 1 through stage 4 chronic kidney disease, or unspecified chronic kidney disease: Secondary | ICD-10-CM | POA: Diagnosis not present

## 2021-02-09 DIAGNOSIS — R29898 Other symptoms and signs involving the musculoskeletal system: Secondary | ICD-10-CM

## 2021-02-09 DIAGNOSIS — N1831 Chronic kidney disease, stage 3a: Secondary | ICD-10-CM | POA: Diagnosis not present

## 2021-02-09 DIAGNOSIS — G9340 Encephalopathy, unspecified: Secondary | ICD-10-CM | POA: Diagnosis present

## 2021-02-09 DIAGNOSIS — Z20822 Contact with and (suspected) exposure to covid-19: Secondary | ICD-10-CM | POA: Diagnosis not present

## 2021-02-09 DIAGNOSIS — I10 Essential (primary) hypertension: Secondary | ICD-10-CM | POA: Diagnosis present

## 2021-02-09 DIAGNOSIS — E785 Hyperlipidemia, unspecified: Secondary | ICD-10-CM | POA: Diagnosis present

## 2021-02-09 DIAGNOSIS — G459 Transient cerebral ischemic attack, unspecified: Secondary | ICD-10-CM | POA: Diagnosis present

## 2021-02-09 LAB — COMPREHENSIVE METABOLIC PANEL
ALT: <5 U/L (ref 0–44)
AST: 17 U/L (ref 15–41)
Albumin: 3.5 g/dL (ref 3.5–5.0)
Alkaline Phosphatase: 83 U/L (ref 38–126)
Anion gap: 7 (ref 5–15)
BUN: 21 mg/dL (ref 8–23)
CO2: 28 mmol/L (ref 22–32)
Calcium: 9.4 mg/dL (ref 8.9–10.3)
Chloride: 104 mmol/L (ref 98–111)
Creatinine, Ser: 1.28 mg/dL — ABNORMAL HIGH (ref 0.61–1.24)
GFR, Estimated: 57 mL/min — ABNORMAL LOW (ref >60)
Glucose, Bld: 163 mg/dL — ABNORMAL HIGH (ref 70–99)
Potassium: 4 mmol/L (ref 3.5–5.1)
Sodium: 139 mmol/L (ref 135–145)
Total Bilirubin: 0.8 mg/dL (ref 0.3–1.2)
Total Protein: 6.7 g/dL (ref 6.5–8.1)

## 2021-02-09 LAB — I-STAT CHEM 8, ED
BUN: 24 mg/dL — ABNORMAL HIGH (ref 8–23)
Calcium, Ion: 1.21 mmol/L (ref 1.15–1.40)
Chloride: 103 mmol/L (ref 98–111)
Creatinine, Ser: 1.2 mg/dL (ref 0.61–1.24)
Glucose, Bld: 152 mg/dL — ABNORMAL HIGH (ref 70–99)
HCT: 39 % (ref 39.0–52.0)
Hemoglobin: 13.3 g/dL (ref 13.0–17.0)
Potassium: 4 mmol/L (ref 3.5–5.1)
Sodium: 141 mmol/L (ref 135–145)
TCO2: 26 mmol/L (ref 22–32)

## 2021-02-09 LAB — ETHANOL: Alcohol, Ethyl (B): <10 mg/dL (ref <10)

## 2021-02-09 LAB — CBG MONITORING, ED: Glucose-Capillary: 148 mg/dL — ABNORMAL HIGH (ref 70–99)

## 2021-02-09 LAB — CBC
HCT: 39.7 % (ref 39.0–52.0)
Hemoglobin: 12.9 g/dL — ABNORMAL LOW (ref 13.0–17.0)
MCH: 28.7 pg (ref 26.0–34.0)
MCHC: 32.5 g/dL (ref 30.0–36.0)
MCV: 88.4 fL (ref 80.0–100.0)
Platelets: 160 10*3/uL (ref 150–400)
RBC: 4.49 MIL/uL (ref 4.22–5.81)
RDW: 12.3 % (ref 11.5–15.5)
WBC: 5.4 10*3/uL (ref 4.0–10.5)
nRBC: 0 % (ref 0.0–0.2)

## 2021-02-09 LAB — DIFFERENTIAL
Abs Immature Granulocytes: 0.02 10*3/uL (ref 0.00–0.07)
Basophils Absolute: 0 10*3/uL (ref 0.0–0.1)
Basophils Relative: 1 %
Eosinophils Absolute: 0.2 10*3/uL (ref 0.0–0.5)
Eosinophils Relative: 3 %
Immature Granulocytes: 0 %
Lymphocytes Relative: 36 %
Lymphs Abs: 2 10*3/uL (ref 0.7–4.0)
Monocytes Absolute: 0.5 10*3/uL (ref 0.1–1.0)
Monocytes Relative: 9 %
Neutro Abs: 2.7 10*3/uL (ref 1.7–7.7)
Neutrophils Relative %: 51 %

## 2021-02-09 LAB — PROTIME-INR
INR: 1.1 (ref 0.8–1.2)
Prothrombin Time: 13.9 seconds (ref 11.4–15.2)

## 2021-02-09 LAB — APTT: aPTT: 25 seconds (ref 24–36)

## 2021-02-09 MED ORDER — IOHEXOL 350 MG/ML SOLN
75.0000 mL | Freq: Once | INTRAVENOUS | Status: AC | PRN
  Administered 2021-02-09: 75 mL via INTRAVENOUS

## 2021-02-09 NOTE — Code Documentation (Incomplete)
Stroke Response Nurse Documentation Code Documentation  Facundo Allemand. is a 80 y.o. male arriving to [place]. [facility] ED via [facility] on *** with past medical hx of heart block with pacemaker, AS, DM, HTN, kidney disease. Code stroke was activated by EMS. Patient from home where he was LKW at 2130 and now complaining of Right Gaze and Left sided weakness . On No antithrombotic. Stroke team at the bedside on patient arrival. Labs drawn and patient cleared for CT by Dr. Stark Jock. Patient to CT with team. NIHSS 4, see documentation for details and code stroke times. Patient with disoriented, Global aphasia  and dysarthria  on exam. The following imaging was completed:  CTA head and neck. Patient is not a candidate for tPA due to improving exam.  Bedside handoff with ED RN .    Madelynn Done  Rapid Response RN

## 2021-02-09 NOTE — ED Triage Notes (Signed)
Pt presents to ED BIB [facility]. Pt presents as code stroke. LSN - 2130, EMS noted L weak grip, slurred speech, R gaze. Pt disoriented at baseline, dementia hx, disoriented to time at baseline.  Ems  VS -  107/86 HR - 62 - paced 96% RA

## 2021-02-09 NOTE — Code Documentation (Signed)
Stroke Response Nurse Documentation Code Documentation  Dylan [name]. is a 80 y.o. male arriving to [place]. [facility] ED via [facility] on 4/12 with past medical hx of heart block with pacemaker, AS, DM, HTN, kidney disease. Code stroke was activated by EMS. Patient from home where he was LKW at 2130 and now complaining of Right Gaze and Left sided weakness . On No antithrombotic. Stroke team at the bedside on patient arrival. Labs drawn and patient cleared for CT by Dr. Stark Jock. Patient to CT with team. NIHSS 4, see documentation for details and code stroke times. Patient with disoriented, Global aphasia  and dysarthria  on exam. The following imaging was completed:  CTA head and neck. Patient is not a candidate for tPA due to improving exam.  Bedside handoff with ED RN Threasa Beards.  Will continue frequent neuro checks until out of the TPA window at 0200.  Madelynn Done  Rapid Response RN

## 2021-02-09 NOTE — Consult Note (Signed)
NEUROLOGY CONSULTATION NOTE   Date of service: February 09, 2021 Patient Name: Dylan [name]. MRN:  [id number] DOB:  1941/01/13 Reason for consult: CODE STROKE _ _ _   _ __   _ __ _ _  __ __   _ __   __ _  History of Present Illness   Dylan Paschal. is a 80 y.o. 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 who presents as code stroke for R gaze preference, decreased L grip strength. LKW 2130. Fiance helped him to BR and when he sat on toilet he became diaphoretic and unresponsive. EMS reported R gaze preference and decreased L grip strength and called code stroke in the field. NIHSS on arrival = 4 (2 for questions, 1 for dysarthria, 1 for best language). Any focal weakness or gaze preference noted in the field had resolved prior to arrival. [facility] showed no ICH, hyperintensity in L BG d/w radiology and felt to be calcification. tPA not administered bc after discussion with pt's fiance by phone his exam was felt to be at his recent baseline (2/2 severe dementia he would not have been able to answer the questions at his recent baseline either, and his dysarthria and WFD are chronic). CTA H&N showed no LVO.   ROS   UTA 2/2 dementia  Past History   Past Medical History:  Diagnosis Date  . Anxiety   . Aortic stenosis    mild AS 11/24/16 (peak grad  24, mean grad 11) Dr. [name]  . CHB (complete heart block) ([place]) 07/2017  . 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[place]) 07/11/2017  . Diabetes mellitus without complication ([place])   . Encounter for care of pacemaker 07/11/2020  . Gait abnormality   . Gout   . Hyperlipemia   . Hypertension   . 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 Memory loss   . Pacemaker Dual chamber [place] MRI  model [id number] 07/11/2020 06/08/2020  . Peripheral arterial disease ([place])   . 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 Second degree AV block    Wenckebach; no indication for pacemaker as of 12/01/16 (Dr. [name])  . Vitamin B12 deficiency anemia 03/01/2018  . Vitamin D deficiency disease    Past Surgical History:  Procedure Laterality Date  . CARDIOVASCULAR STRESS TEST     11/21/16 Low risk study, EF 52% [facility])  . EYE SURGERY    . KYPHOPLASTY N/A 02/15/2017   Procedure: LUMBAR FOUR KYPHOPLASTY;  Surgeon: Phylliss Bob, MD;  Location: [place];  Service: Orthopedics;  Laterality: N/A;  . lipoma surgery     neck - 30 years ago  . PACEMAKER IMPLANT N/A 07/11/2017   Procedure: Pacemaker Implant;  Surgeon: Constance Haw, MD;  Location: [place] CV LAB;  Service: Cardiovascular;  Laterality: N/A;  . TRANSTHORACIC ECHOCARDIOGRAM     11/24/16 [facility] CV): EF 55-60%, mild AS, mild-mod MR, mod TR, moderate pulm HTN,  PAP 49 mmHg   Family History  Problem Relation Age of Onset  . Diabetes Mother   . Breast cancer Mother   . Arthritis Mother   . Hypertension Father   . Diabetes Sister   . Diabetes Brother   . Diabetes Maternal Grandmother   . Diabetes Brother   . Diabetes Brother   . Diabetes Brother   . Diabetes Sister   . Diabetes Sister   . Diabetes Sister    Social History   Socioeconomic History  . Marital status: Widowed    Spouse name: Not on file  . Number of children: 3  . Years of education: 43  .  Highest education level: High school graduate  Occupational History  . Occupation: retired  Tobacco Use  . Smoking status: Former Smoker    Packs/day: 0.50    Years: 7.00    Pack years: 3.50    Types: Cigarettes  . Smokeless tobacco: Never Used  . Tobacco comment: quit 35 years  Vaping Use  . Vaping Use: Never used  Substance and Sexual Activity  . Alcohol use: No  . Drug use: No  . Sexual activity: Not Currently  Other Topics Concern  . Not on file  Social History Narrative   Lives at home with his fiancee.   Right-handed.   No daily use of caffeine.      Social Determinants of Health   Financial Resource Strain: Low Risk   . Difficulty of Paying Living Expenses: Not hard at all  Food Insecurity: No Food Insecurity  . Worried About Charity fundraiser in the Last Year: Never true  . Ran Out of Food in the Last Year: Never true  Transportation Needs: No Transportation Needs  . Lack of Transportation (Medical): No  . Lack of Transportation (Non-Medical): No  Physical Activity: Sufficiently Active  . Days of Exercise per Week: 3 days  . Minutes of Exercise per Session: 60 min  Stress: No Stress Concern Present  . Feeling of Stress : Not at all  Social Connections: Not on file   No Known Allergies  Medications   (Not in a hospital admission)    Vitals   Vitals:   02/09/21 2300 02/09/21 2316  BP:  137/76  Weight: 72.4 kg      Body mass index is 25.76 kg/m.  Physical Exam   Physical Exam Gen: A&O x4, NAD HEENT: Atraumatic, normocephalic;mucous membranes moist; oropharynx clear, tongue without atrophy or fasciculations. Neck: Supple, trachea midline. Resp: CTAB, no w/r/r CV: RRR, no m/g/r; nml S1 and S2. 2+ symmetric peripheral pulses. Abd: soft/NT/ND; nabs x 4 quad Extrem: Nml bulk; no cyanosis, clubbing, or edema.  Neuro: *MS: oriented to self and hospital. Follows single step commands but not multi-step.  Speech: mild dysarthria, naming mildly  impaired, repetition intact. *CN:    I: Deferred   II,III: PERRLA, VFF by confrontation, optic discs sharp   III,IV,VI: EOMI w/o nystagmus, no ptosis   V: Sensation intact from V1 to V3 to LT   VII: Eyelid closure was full.  Smile symmetric.   VIII: Hearing intact to voice   IX,X: Voice normal, palate elevates symmetrically    XI: SCM/trap 5/5 bilat   XII: Tongue protrudes midline, no atrophy or fasciculations   *Motor:   Normal bulk.  Increased tone BUE with cogwheeling rigidity bilat. No tremor. No pronator drift.    Strength: Dlt Bic Tri WrE WrF FgS Gr HF KnF KnE PlF DoF  Left 5 5 5 5 5 5 5 5 5 5 5 5     Right 5 5 5 5 5 5 5 5 5 5 5 5     *Sensory: SILT. No double-simultaneous extinction.  *Coordination:  UTA FNF 2/2 comprehension *Reflexes:  2+ and symmetric throughout without clonus; toes down-going bilat *Gait: deferred  NIHSS = 4 (2 for questions, 1 for dysarthria, 1 for best language)   Labs   CBC:  Recent Labs  Lab 02/03/21 1516 02/09/21 2305 02/09/21 2313  WBC 3.3* 5.4  --   NEUTROABS  --  2.7  --   HGB 13.6 12.9* 13.3  HCT 39.8 39.7 39.0  MCV 85 88.4  --   PLT 172 160  --     Basic Metabolic Panel:  Lab Results  Component Value Date   NA 141 02/09/2021   K 4.0 02/09/2021   CO2 28 02/09/2021   GLUCOSE 152 (H) 02/09/2021   BUN 24 (H) 02/09/2021   CREATININE 1.20 02/09/2021   CALCIUM 9.4 02/09/2021   GFRNONAA 57 (L) 02/09/2021   GFRAA >60 05/11/2020   Lipid Panel:  Lab Results  Component Value Date   LDLCALC 65 11/03/2020   HgbA1c:  Lab Results  Component Value Date   HGBA1C 6.6 (H) 02/03/2021   Urine Drug Screen:     Component Value Date/Time   LABOPIA NONE DETECTED 04/27/2020 1600   COCAINSCRNUR NONE DETECTED 04/27/2020 1600   LABBENZ NONE DETECTED 04/27/2020 1600   AMPHETMU NONE DETECTED 04/27/2020 1600   THCU NONE DETECTED 04/27/2020 1600   LABBARB NONE DETECTED 04/27/2020 1600    Alcohol Level     Component Value Date/Time    ETH <10 02/09/2021 2305     Impression   80 yo gentleman with hx 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 Patient appears to be at his recent baseline (mRS = 4). Suspect vagal episode during valsalva but given focal neurologic deficits on EMS arrival will admit for TIA/stroke w/u.  Pt was observed in ED until 0200 4/13 with no deterioration in neuro exam and would now be outside the tPA window if he develops new neurologic deficits. If this occurs STAT head CT should be performed immediately and neurology STAT paged.  Recommendations   - Admit to hospitalist service for stroke w/u; stroke team will consult - Permissive HTN x48 hrs from sx onset or until stroke ruled out by MRI goal BP <220/110. PRN labetalol or hydralazine if BP above these parameters. Avoid oral antihypertensives. - MRI brain wo contrast (pt has PM which fiance states in MRI compatible) - CTA H&N already obtained - TTE no bubble - Continue pravastatin 20mg  daily; adjust as needed per guidelines based on pending LDL - ASA 325mg  now f/b ASA 81mg  daily - q4 hr neuro checks - STAT head CT for any change in neuro exam - Tele. Interrogate PM tomorrow. - PT/OT/SLP - Stroke education - F/u with established outpatient neurologist post-discharge  Stroke team will continue to follow.  ______________________________________________________________________   Thank you for the opportunity to take part in the care of this patient. If you have any further questions, please contact the neurology consultation attending.  Signed,  Su Monks, MD [facility] [phone]  If 7pm- 7am, please page neurology on call as listed in [place].

## 2021-02-10 ENCOUNTER — Observation Stay (HOSPITAL_COMMUNITY): Payer: Medicare HMO

## 2021-02-10 ENCOUNTER — Encounter (HOSPITAL_COMMUNITY): Payer: Self-pay | Admitting: Internal Medicine

## 2021-02-10 ENCOUNTER — Telehealth: Payer: Self-pay | Admitting: Nurse Practitioner

## 2021-02-10 ENCOUNTER — Other Ambulatory Visit: Payer: Medicare HMO | Admitting: Nurse Practitioner

## 2021-02-10 DIAGNOSIS — I10 Essential (primary) hypertension: Secondary | ICD-10-CM | POA: Diagnosis not present

## 2021-02-10 DIAGNOSIS — E1122 Type 2 diabetes mellitus with diabetic chronic kidney disease: Secondary | ICD-10-CM | POA: Diagnosis not present

## 2021-02-10 DIAGNOSIS — G9340 Encephalopathy, unspecified: Secondary | ICD-10-CM | POA: Diagnosis not present

## 2021-02-10 DIAGNOSIS — G459 Transient cerebral ischemic attack, unspecified: Secondary | ICD-10-CM

## 2021-02-10 DIAGNOSIS — Z515 Encounter for palliative care: Secondary | ICD-10-CM

## 2021-02-10 DIAGNOSIS — E785 Hyperlipidemia, unspecified: Secondary | ICD-10-CM | POA: Diagnosis not present

## 2021-02-10 DIAGNOSIS — N1831 Chronic kidney disease, stage 3a: Secondary | ICD-10-CM

## 2021-02-10 LAB — BASIC METABOLIC PANEL
Anion gap: 7 (ref 5–15)
BUN: 22 mg/dL (ref 8–23)
CO2: 26 mmol/L (ref 22–32)
Calcium: 9.6 mg/dL (ref 8.9–10.3)
Chloride: 107 mmol/L (ref 98–111)
Creatinine, Ser: 1.14 mg/dL (ref 0.61–1.24)
GFR, Estimated: >60 mL/min (ref >60)
Glucose, Bld: 144 mg/dL — ABNORMAL HIGH (ref 70–99)
Potassium: 4 mmol/L (ref 3.5–5.1)
Sodium: 140 mmol/L (ref 135–145)

## 2021-02-10 LAB — URINALYSIS, ROUTINE W REFLEX MICROSCOPIC
Bacteria, UA: NONE SEEN
Bilirubin Urine: NEGATIVE
Glucose, UA: NEGATIVE mg/dL
Hgb urine dipstick: NEGATIVE
Ketones, ur: 5 mg/dL — AB
Leukocytes,Ua: NEGATIVE
Nitrite: NEGATIVE
Protein, ur: NEGATIVE mg/dL
Specific Gravity, Urine: 1.036 — ABNORMAL HIGH (ref 1.005–1.030)
pH: 6 (ref 5.0–8.0)

## 2021-02-10 LAB — CBC
HCT: 39.3 % (ref 39.0–52.0)
Hemoglobin: 12.8 g/dL — ABNORMAL LOW (ref 13.0–17.0)
MCH: 28.6 pg (ref 26.0–34.0)
MCHC: 32.6 g/dL (ref 30.0–36.0)
MCV: 87.9 fL (ref 80.0–100.0)
Platelets: 169 10*3/uL (ref 150–400)
RBC: 4.47 MIL/uL (ref 4.22–5.81)
RDW: 12.5 % (ref 11.5–15.5)
WBC: 5.2 10*3/uL (ref 4.0–10.5)
nRBC: 0 % (ref 0.0–0.2)

## 2021-02-10 LAB — GLUCOSE, CAPILLARY
Glucose-Capillary: 113 mg/dL — ABNORMAL HIGH (ref 70–99)
Glucose-Capillary: 180 mg/dL — ABNORMAL HIGH (ref 70–99)
Glucose-Capillary: 127 mg/dL — ABNORMAL HIGH (ref 70–99)

## 2021-02-10 LAB — LDL CHOLESTEROL, DIRECT: Direct LDL: 49.6 mg/dL (ref 0–99)

## 2021-02-10 LAB — RAPID URINE DRUG SCREEN, HOSP PERFORMED
Amphetamines: NOT DETECTED
Barbiturates: NOT DETECTED
Benzodiazepines: NOT DETECTED
Cocaine: NOT DETECTED
Opiates: NOT DETECTED
Tetrahydrocannabinol: NOT DETECTED

## 2021-02-10 LAB — RESP PANEL BY RT-PCR (FLU A&B, COVID) ARPGX2
Influenza A by PCR: NEGATIVE
Influenza B by PCR: NEGATIVE
SARS Coronavirus 2 by RT PCR: NEGATIVE

## 2021-02-10 LAB — MAGNESIUM: Magnesium: 1.7 mg/dL (ref 1.7–2.4)

## 2021-02-10 LAB — CBG MONITORING, ED: Glucose-Capillary: 129 mg/dL — ABNORMAL HIGH (ref 70–99)

## 2021-02-10 LAB — TSH: TSH: 0.447 u[IU]/mL (ref 0.350–4.500)

## 2021-02-10 MED ORDER — RIVASTIGMINE 4.6 MG/24HR TD PT24
4.6000 mg | MEDICATED_PATCH | Freq: Every day | TRANSDERMAL | Status: DC
  Administered 2021-02-10 – 2021-02-12 (×3): 4.6 mg via TRANSDERMAL
  Filled 2021-02-10 (×4): qty 1

## 2021-02-10 MED ORDER — APIXABAN 5 MG PO TABS
5.0000 mg | ORAL_TABLET | Freq: Two times a day (BID) | ORAL | Status: DC
  Administered 2021-02-10 – 2021-02-12 (×6): 5 mg via ORAL
  Filled 2021-02-10 (×6): qty 1

## 2021-02-10 MED ORDER — INSULIN ASPART 100 UNIT/ML ~~LOC~~ SOLN
0.0000 [IU] | Freq: Three times a day (TID) | SUBCUTANEOUS | Status: DC
  Administered 2021-02-10 – 2021-02-11 (×2): 1 [IU] via SUBCUTANEOUS
  Administered 2021-02-11 – 2021-02-12 (×2): 2 [IU] via SUBCUTANEOUS

## 2021-02-10 MED ORDER — ASPIRIN 300 MG RE SUPP: 300.0000 mg | Freq: Every day | RECTAL | Status: DC

## 2021-02-10 MED ORDER — PERFLUTREN LIPID MICROSPHERE
1.0000 mL | INTRAVENOUS | Status: DC | PRN
  Administered 2021-02-10: 2 mL via INTRAVENOUS
  Filled 2021-02-10: qty 10

## 2021-02-10 MED ORDER — CARBIDOPA-LEVODOPA 25-100 MG PO TABS
2.0000 | ORAL_TABLET | Freq: Three times a day (TID) | ORAL | Status: DC
  Administered 2021-02-10 – 2021-02-12 (×7): 2 via ORAL
  Filled 2021-02-10 (×10): qty 2

## 2021-02-10 MED ORDER — ACETAMINOPHEN 325 MG PO TABS
650.0000 mg | ORAL_TABLET | Freq: Four times a day (QID) | ORAL | Status: DC | PRN
  Filled 2021-02-10: qty 2

## 2021-02-10 MED ORDER — ACETAMINOPHEN 650 MG RE SUPP: 650.0000 mg | Freq: Four times a day (QID) | RECTAL | Status: DC | PRN

## 2021-02-10 MED ORDER — MEMANTINE HCL 10 MG PO TABS
10.0000 mg | ORAL_TABLET | Freq: Two times a day (BID) | ORAL | Status: DC
  Administered 2021-02-10 – 2021-02-12 (×5): 10 mg via ORAL
  Filled 2021-02-10 (×7): qty 1

## 2021-02-10 MED ORDER — SODIUM CHLORIDE 0.9% FLUSH
10.0000 mL | Freq: Once | INTRAVENOUS | Status: AC
  Administered 2021-02-10: 10 mL via INTRAVENOUS

## 2021-02-10 MED ORDER — PRAVASTATIN SODIUM 40 MG PO TABS
20.0000 mg | ORAL_TABLET | Freq: Every day | ORAL | Status: DC
  Administered 2021-02-11 – 2021-02-12 (×2): 20 mg via ORAL
  Filled 2021-02-10 (×2): qty 1

## 2021-02-10 MED ORDER — ASPIRIN 81 MG PO CHEW: 81.0000 mg | CHEWABLE_TABLET | Freq: Every day | ORAL | Status: DC

## 2021-02-10 MED ORDER — STROKE: EARLY STAGES OF RECOVERY BOOK
Freq: Once | Status: DC
  Filled 2021-02-10: qty 1

## 2021-02-10 MED ORDER — ASPIRIN 81 MG PO CHEW
81.0000 mg | CHEWABLE_TABLET | Freq: Every day | ORAL | Status: DC
  Administered 2021-02-10 – 2021-02-12 (×3): 81 mg via ORAL
  Filled 2021-02-10 (×3): qty 1

## 2021-02-10 NOTE — Evaluation (Signed)
Physical Therapy Evaluation Patient Details Name: Dylan [name]. MRN: [id number] DOB: 1941/06/15 Today's Date: 02/10/2021   History of Present Illness  80 yo male with onset of L side weakness was brought to ED, and found no stroke definitive findings yet.  MRI and echo bubble study pending.  PMHx:  dementia, Parkinson's like symptoms, DM, HTN, B12 defic, atherosclerosis, vertebral artery stenosis, pacemaker, complete AV block,  Clinical Impression  Pt was seen for mobility on side of bed, unsafe to stand but is also at wheelchair level to move per pt.  He is a slightly unreliable historian, but did seem convinced of the history that he shared.  Follow him for progression of movement, and will work toward SNF stay since he is home with wife.  His MRI did not show new stroke changes and so may be a shorter recovery in SNF.  Follow for goals of acute PT, focusing on balance and STS tasks.    Follow Up Recommendations SNF    Equipment Recommendations  None recommended by PT    Recommendations for Other Services       Precautions / Restrictions Precautions Precautions: Fall Precaution Comments: R side lean in sitting Restrictions Weight Bearing Restrictions: No      Mobility  Bed Mobility Overal bed mobility: Needs Assistance Bed Mobility: Supine to Sit;Sit to Supine     Supine to sit: Mod assist Sit to supine: Mod assist   General bed mobility comments: mod assist with help to scoot to EOB and mod to lift legs back to bed and max to scoot up in bed    Transfers Overall transfer level: Needs assistance               General transfer comment: unsafe to attempt  Ambulation/Gait             General Gait Details: per pt he has been at Wheelchair level Cabin crew    Modified Rankin (Stroke Patients Only)       Balance Overall balance assessment: Needs assistance Sitting-balance support: Bilateral upper extremity  supported;Feet supported Sitting balance-Leahy Scale: Poor         Standing balance comment: unsafe to attempt                             Pertinent Vitals/Pain Pain Assessment: No/denies pain    Home Living Family/patient expects to be discharged to:: Skilled nursing facility                 Additional Comments: has stairs to enter per pt    Prior Function Level of Independence: Independent with assistive device(s)         Comments: pt reports wife helps him into wheelchair     Hand Dominance   Dominant Hand: Right    Extremity/Trunk Assessment   Upper Extremity Assessment Upper Extremity Assessment: Defer to OT evaluation    Lower Extremity Assessment Lower Extremity Assessment: Generalized weakness (B weakness, no side preference)    Cervical / Trunk Assessment Cervical / Trunk Assessment: Kyphotic  Communication   Communication: Expressive difficulties;Other (comment) (speech is not completely clear)  Cognition Arousal/Alertness: Awake/alert Behavior During Therapy: Flat affect Overall Cognitive Status: History of cognitive impairments - at baseline  General Comments: pt is not fully able to give his history      General Comments General comments (skin integrity, edema, etc.): pt was up to side of bed with help and was unable to stand, listing to R side and using R side more to help scoot up in bed    Exercises     Assessment/Plan    PT Assessment Patient needs continued PT services  PT Problem List Decreased strength;Decreased activity tolerance;Decreased balance;Decreased mobility;Decreased coordination;Decreased cognition;Decreased knowledge of use of DME;Decreased safety awareness       PT Treatment Interventions DME instruction;Functional mobility training;Gait training;Stair training;Therapeutic activities;Therapeutic exercise;Balance training;Neuromuscular  re-education;Patient/family education    PT Goals (Current goals can be found in the Care Plan section)  Acute Rehab PT Goals Patient Stated Goal: to get home PT Goal Formulation: With patient Time For Goal Achievement: 02/24/21 Potential to Achieve Goals: Good    Frequency Min 3X/week   Barriers to discharge Inaccessible home environment;Decreased caregiver support home with wife with stairs to enter    Co-evaluation               AM-PAC PT "6 Clicks" Mobility  Outcome Measure Help needed turning from your back to your side while in a flat bed without using bedrails?: A Lot Help needed moving from lying on your back to sitting on the side of a flat bed without using bedrails?: A Lot Help needed moving to and from a bed to a chair (including a wheelchair)?: A Lot Help needed standing up from a chair using your arms (e.g., wheelchair or bedside chair)?: A Lot Help needed to walk in hospital room?: Total Help needed climbing 3-5 steps with a railing? : Total 6 Click Score: 10    End of Session Equipment Utilized During Treatment: Gait belt Activity Tolerance: Patient limited by fatigue;Other (comment) (weakness) Patient left: in bed;with call bell/phone within reach;with nursing/sitter in room Nurse Communication: Mobility status PT Visit Diagnosis: Muscle weakness (generalized) (M62.81);Other abnormalities of gait and mobility (R26.89)    Time: 1540-0867 PT Time Calculation (min) (ACUTE ONLY): 29 min   Charges:   PT Evaluation $PT Eval Moderate Complexity: 1 Mod PT Treatments $Therapeutic Activity: 8-22 mins       Ramond Dial 02/10/2021, 3:09 PM Mee Hives, PT MS Acute Rehab Dept. Number: [place] and [place]

## 2021-02-10 NOTE — Telephone Encounter (Signed)
I called Dylan [name], Mr. Dylan [name] caregiver for [facility] f/u visit telemedicine, Dylan [name] endorses [name] was taken by ems to the hospital this am unresponsive. Discussed will notify hospital liaison team.

## 2021-02-10 NOTE — Evaluation (Signed)
Clinical/Bedside Swallow Evaluation Patient Details  Name: Dylan [name]. MRN: [id number] Date of Birth: 1941-06-29  Today's Date: 02/10/2021 Time: SLP Start Time (ACUTE ONLY): 1400 SLP Stop Time (ACUTE ONLY): 1412 SLP Time Calculation (min) (ACUTE ONLY): 12 min  Past Medical History:  Past Medical History:  Diagnosis Date  . Anxiety   . Aortic stenosis    mild AS 11/24/16 (peak grad 24, mean grad 11) Dr. [name]  . CHB (complete heart block) ([place]) 07/2017  . 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[place]) 07/11/2017  . Diabetes mellitus without complication ([place])   . Encounter for care of pacemaker 07/11/2020  . Gait abnormality   . Gout   . Hyperlipemia   . Hypertension   . 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 Memory loss   . Pacemaker Dual chamber [place] MRI  model [id number] 07/11/2020 06/08/2020  . Peripheral arterial disease ([place])   . 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 Second degree AV block    Wenckebach; no indication for pacemaker as of 12/01/16 (Dr. [name])  . Vitamin B12 deficiency anemia 03/01/2018  . Vitamin D deficiency disease    Past Surgical History:  Past Surgical History:  Procedure Laterality Date  . CARDIOVASCULAR STRESS TEST     11/21/16 Low risk study, EF 52% [facility])  . EYE SURGERY    . KYPHOPLASTY N/A 02/15/2017   Procedure: LUMBAR FOUR KYPHOPLASTY;  Surgeon: Phylliss Bob, MD;  Location: [place];  Service: Orthopedics;  Laterality: N/A;  . lipoma surgery     neck - 30 years ago  . PACEMAKER IMPLANT N/A 07/11/2017   Procedure: Pacemaker Implant;  Surgeon: Constance Haw, MD;  Location: [place] CV LAB;  Service: Cardiovascular;  Laterality: N/A;  . TRANSTHORACIC ECHOCARDIOGRAM     11/24/16 [facility] CV): EF 55-60%, mild AS, mild-mod MR, mod TR, moderate pulm HTN, PAP 49 mmHg   HPI:  Dylan [name]. is a 80 y.o. male with hx severe dementia in  setting of Parkinson's disease presented as code stroke for R gaze preference and decreased L grip strength which resolved prior to arrival. MRI negative.   Assessment / Plan / Recommendation Clinical Impression  Pt demonstrates swallowing impairment related to baseline Parkinsons. He is unable to follow oral motor commands and has slow rate and inability to initiate speech at times. When given a straw to sip, pt has strong immediate labial seal and drinks severl oz consecutively without signs of aspiration. He has increased difficulty sealing lips to cup edge. Pt able to masticate well with gums and reports he often eats without dentition. Recommend mech soft and thin liquids, pt will need assist. SLP Visit Diagnosis: Dysphagia, oropharyngeal phase (R13.12)    Aspiration Risk  Mild aspiration risk    Diet Recommendation Dysphagia 3 (Mech soft);Thin liquid   Liquid Administration via: Cup;Straw Medication Administration: Crushed with puree Supervision: Patient able to self feed Compensations: Slow rate;Small sips/bites Postural Changes: Seated upright at 90 degrees    Other  Recommendations Oral Care Recommendations: Oral care BID   Follow up Recommendations        Frequency and Duration            Prognosis        Swallow Study   General HPI: Dylan [name]. is a 80 y.o. 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 MRI negative. Type of Study: Bedside Swallow Evaluation Diet Prior to this Study: NPO Temperature Spikes Noted: No Respiratory Status: Room air History of Recent Intubation: No Behavior/Cognition: Alert;Pleasant mood Oral Cavity Assessment: Within Functional Limits Oral Care Completed by SLP: No Oral Cavity - Dentition: Edentulous;Dentures, not available Self-Feeding Abilities: Total assist Patient Positioning: Upright in  bed Baseline Vocal Quality: Normal Volitional Cough: Strong Volitional Swallow: Able to elicit    Oral/Motor/Sensory Function Overall Oral Motor/Sensory Function:  (pt could not initiate oral motor commands. slow spasic movement)   Ice Chips Ice chips: Not tested   Thin Liquid Thin Liquid: Impaired Presentation: Straw;Cup Oral Phase Impairments: Reduced labial seal Pharyngeal  Phase Impairments: Cough - Immediate    Nectar Thick Nectar Thick Liquid: Not tested   Honey Thick Honey Thick Liquid: Not tested   Puree Puree: Within functional limits Presentation: Spoon   Solid     Solid: Within functional limits      Dylan [name], Katherene Ponto 02/10/2021,2:29 PM

## 2021-02-10 NOTE — ED Notes (Signed)
Pt transferred to Floor

## 2021-02-10 NOTE — ED Notes (Signed)
[place] notified that pt is unable to go to [facility] because pt has a pacemaker which requires radiologists to be present. MRI informed rn the radiologists wont be there until later today.

## 2021-02-10 NOTE — Progress Notes (Signed)
PROGRESS NOTE    Dylan [name].  JJH:[id number] DOB: 10/25/1941 DOA: 02/09/2021 PCP: [name], MD      Brief Narrative:  Dylan [name] is a 80 y.o. Dylan [name] with hx CVA, dementia, homdwelling, Parkinsonism, mostly bed bound, recent PPM for complete heart block, HTN, and DM who was admitted for a sudden unresponsive spell.       Assessment & Plan:  TIA  New onset atrial fibrillation  Carotid artery disease  Parkinson's disease  Hypertension  Diabetes  Acute metabolic encephalopathy ruled out    Disposition: Status is: Observation  The patient remains OBS appropriate and will d/c before 2 midnights.  Dispo: The patient is from: Home              Anticipated d/c is to: Home              Patient currently is not medically stable to d/c.   Difficult to place patient No              MDM: This is a no charge note.  For further details, please see H&P by my partner Dr. Velia Meyer from earlier today.  The below labs and imaging reports were reviewed and summarized above.    DVT prophylaxis: Place and maintain sequential compression device Start: 02/10/21 0356 SCDs Start: 02/10/21 0352 apixaban (ELIQUIS) tablet 5 mg  Code Status: FULL Family Communication: Dylan [name]                   Objective: Vitals:   02/10/21 1145 02/10/21 1245 02/10/21 1355 02/10/21 1942  BP: (!) 180/57 (!) 163/65 (!) 141/55 (!) 152/108  Pulse: 75 85 79 79  Resp: 18 18 17 17   Temp:   98.1 F (36.7 C) 99 F (37.2 C)  TempSrc:   Oral Axillary  SpO2: 100% 100% 100% 99%  Weight:        Intake/Output Summary (Last 24 hours) at 02/10/2021 1949 Last data filed at 02/10/2021 1353 Gross per 24 hour  Intake 240 ml  Output --  Net 240 ml   Filed Weights   02/09/21 2300  Weight: 72.4 kg    Examination: The patient was seen and examined.      Data Reviewed: I have personally reviewed following labs and imaging studies:  CBC: Recent Labs  Lab 02/09/21 2305  02/09/21 2313 02/10/21 0440  WBC 5.4  --  5.2  NEUTROABS 2.7  --   --   HGB 12.9* 13.3 12.8*  HCT 39.7 39.0 39.3  MCV 88.4  --  87.9  PLT 160  --  818   Basic Metabolic Panel: Recent Labs  Lab 02/09/21 2305 02/09/21 2313 02/10/21 0440  NA 139 141 140  K 4.0 4.0 4.0  CL 104 103 107  CO2 28  --  26  GLUCOSE 163* 152* 144*  BUN 21 24* 22  CREATININE 1.28* 1.20 1.14  CALCIUM 9.4  --  9.6  MG  --   --  1.7   GFR: Estimated Creatinine Clearance: 47.4 mL/min (by C-G formula based on SCr of 1.14 mg/dL). Liver Function Tests: Recent Labs  Lab 02/09/21 2305  AST 17  ALT <5  ALKPHOS 83  BILITOT 0.8  PROT 6.7  ALBUMIN 3.5   No results for input(s): LIPASE, AMYLASE in the last 168 hours. No results for input(s): AMMONIA in the last 168 hours. Coagulation Profile: Recent Labs  Lab 02/09/21 2305  INR 1.1   Cardiac Enzymes: No results for input(s):  CKTOTAL, CKMB, CKMBINDEX, TROPONINI in the last 168 hours. BNP (last 3 results) No results for input(s): PROBNP in the last 8760 hours. HbA1C: No results for input(s): HGBA1C in the last 72 hours. CBG: Recent Labs  Lab 02/09/21 2303 02/10/21 0828 02/10/21 1320 02/10/21 1642  GLUCAP 148* 129* 113* 180*   Lipid Profile: Recent Labs    02/10/21 0145  LDLDIRECT 49.6   Thyroid Function Tests: Recent Labs    02/10/21 0440  TSH 0.447   Anemia Panel: No results for input(s): VITAMINB12, FOLATE, FERRITIN, TIBC, IRON, RETICCTPCT in the last 72 hours. Urine analysis:    Component Value Date/Time   COLORURINE YELLOW 02/10/2021 0832   APPEARANCEUR CLEAR 02/10/2021 0832   LABSPEC 1.036 (H) 02/10/2021 0832   PHURINE 6.0 02/10/2021 0832   GLUCOSEU NEGATIVE 02/10/2021 0832   HGBUR NEGATIVE 02/10/2021 0832   BILIRUBINUR NEGATIVE 02/10/2021 0832   BILIRUBINUR negative 11/03/2020 1747   KETONESUR 5 (A) 02/10/2021 0832   PROTEINUR NEGATIVE 02/10/2021 0832   UROBILINOGEN 0.2 11/03/2020 1747   NITRITE NEGATIVE 02/10/2021  0832   LEUKOCYTESUR NEGATIVE 02/10/2021 0832   Sepsis Labs: @LABRCNTIP (procalcitonin:4,lacticacidven:4)  ) Recent Results (from the past 240 hour(s))  Resp Panel by RT-PCR (Flu A&B, Covid) Nasopharyngeal Swab     Status: None   Collection Time: 02/10/21 12:37 AM   Specimen: Nasopharyngeal Swab; Nasopharyngeal(NP) swabs in vial transport medium  Result Value Ref Range Status   SARS Coronavirus 2 by RT PCR NEGATIVE NEGATIVE Final    Comment: (NOTE) SARS-CoV-2 target nucleic acids are NOT DETECTED.  The SARS-CoV-2 RNA is generally detectable in upper respiratory specimens during the acute phase of infection. The lowest concentration of SARS-CoV-2 viral copies this assay can detect is 138 copies/mL. A negative result does not preclude SARS-Cov-2 infection and should not be used as the sole basis for treatment or other patient management decisions. A negative result may occur with  improper specimen collection/handling, submission of specimen other than nasopharyngeal swab, presence of viral mutation(s) within the areas targeted by this assay, and inadequate number of viral copies(<138 copies/mL). A negative result must be combined with clinical observations, patient history, and epidemiological information. The expected result is Negative.  Fact Sheet for Patients:  EntrepreneurPulse.com.au  Fact Sheet for Healthcare Providers:  IncredibleEmployment.be  This test is no t yet approved or cleared by the [place] FDA and  has been authorized for detection and/or diagnosis of SARS-CoV-2 by FDA under an Emergency Use Authorization (EUA). This EUA will remain  in effect (meaning this test can be used) for the duration of the COVID-19 declaration under Section 564(b)(1) of the Act, 21 U.S.C.section 360bbb-3(b)(1), unless the authorization is terminated  or revoked sooner.       Influenza A by PCR NEGATIVE NEGATIVE Final   Influenza B by PCR  NEGATIVE NEGATIVE Final    Comment: (NOTE) The Xpert Xpress SARS-CoV-2/FLU/RSV plus assay is intended as an aid in the diagnosis of influenza from Nasopharyngeal swab specimens and should not be used as a sole basis for treatment. Nasal washings and aspirates are unacceptable for Xpert Xpress SARS-CoV-2/FLU/RSV testing.  Fact Sheet for Patients: EntrepreneurPulse.com.au  Fact Sheet for Healthcare Providers: IncredibleEmployment.be  This test is not yet approved or cleared by the [place] FDA and has been authorized for detection and/or diagnosis of SARS-CoV-2 by FDA under an Emergency Use Authorization (EUA). This EUA will remain in effect (meaning this test can be used) for the duration of the COVID-19 declaration under Section 564(b)(1)  of the Act, 21 U.S.C. section 360bbb-3(b)(1), unless the authorization is terminated or revoked.  Performed at [facility], [name] [address] [place], [place] [address]          Radiology Studies: MR BRAIN WO CONTRAST  Result Date: 02/10/2021 CLINICAL DATA:  Right gaze preference and decreased left grip strength. EXAM: MRI HEAD WITHOUT CONTRAST TECHNIQUE: Multiplanar, multiecho pulse sequences of the brain and surrounding structures were obtained without intravenous contrast. COMPARISON:  Head CT and CTA 02/09/2021 FINDINGS: Brain: There is no evidence of an acute infarct, intracranial hemorrhage, mass, midline shift, or extra-axial fluid collection. T2 hyperintensities in the cerebral white matter bilaterally are nonspecific but compatible with mild chronic small vessel ischemic disease. There is moderate generalized cerebral atrophy. Vascular: Major intracranial vascular flow voids are preserved. Skull and upper cervical spine: Unremarkable bone marrow signal. Sinuses/Orbits: Bilateral cataract extraction. Clear paranasal sinuses. Small right mastoid effusion. Other: None. IMPRESSION: 1. No acute  intracranial abnormality. 2. Mild chronic small vessel ischemic disease and moderate cerebral atrophy. Electronically Signed   By: Logan Bores Dylan [name].D.   On: 02/10/2021 11:22   CT HEAD CODE STROKE WO CONTRAST  Result Date: 02/09/2021 CLINICAL DATA:  Code stroke. Initial evaluation for neuro deficit, stroke suspected. EXAM: CT HEAD WITHOUT CONTRAST TECHNIQUE: Contiguous axial images were obtained from the base of the skull through the vertex without intravenous contrast. COMPARISON:  Prior CT from 04/27/2020 FINDINGS: Brain: Moderately advanced age-related cerebral atrophy with chronic small vessel ischemic disease. No acute intracranial hemorrhage. Linear hyperdensity at the left basal ganglia most consistent with calcification/mineralization, stable from previous. No acute large vessel territory infarct. No mass lesion, midline shift or mass effect. Mild ventricular prominence related to global parenchymal volume loss of hydrocephalus. No extra-axial fluid collection. Vascular: No hyperdense vessel. Scattered vascular calcifications noted within the carotid siphons. Skull: Scalp soft tissues and calvarium within normal limits. Sinuses/Orbits: Globes and orbital soft tissues within normal limits. Paranasal sinuses are largely clear. Trace right mastoid effusion noted, of doubtful significance. Other: None. ASPECTS [facility] Stroke Program Early CT Score) - Ganglionic level infarction (caudate, lentiform nuclei, internal capsule, insula, M1-M3 cortex): 7 - Supraganglionic infarction (M4-M6 cortex): 3 Total score (0-10 with 10 being normal): 10 IMPRESSION: 1. No acute intracranial infarct or other abnormality. 2. ASPECTS is 10. 3. Moderately advanced cerebral atrophy with chronic small vessel ischemic disease. These results were communicated to Dr. Quinn Axe at 11:20 [place] 4/12/2022by text page via the [facility] messaging system. Electronically Signed   By: Jeannine Boga Dylan [name].D.   On: 02/09/2021 23:24   CT ANGIO HEAD  CODE STROKE  Result Date: 02/09/2021 CLINICAL DATA:  Initial evaluation for acute stroke, right gaze, left arm weakness, slurred speech. EXAM: CT ANGIOGRAPHY HEAD AND NECK TECHNIQUE: Multidetector CT imaging of the head and neck was performed using the standard protocol during bolus administration of intravenous contrast. Multiplanar CT image reconstructions and MIPs were obtained to evaluate the vascular anatomy. Carotid stenosis measurements (when applicable) are obtained utilizing NASCET criteria, using the distal internal carotid diameter as the denominator. CONTRAST:  27mL OMNIPAQUE IOHEXOL 350 MG/ML SOLN COMPARISON:  Prior noncontrast head CT from earlier the same day. FINDINGS: CTA NECK FINDINGS Aortic arch: Visualized aortic arch normal in caliber with normal branch pattern. Moderate atheromatous change about the arch and origin of the great vessels without hemodynamically significant stenosis. Right carotid system: Scattered mixed plaque about the right CCA without high-grade stenosis. Extensive bulky calcified plaque about the right carotid bulb/proximal right  ICA with associated stenosis of up to 70% by NASCET criteria. Right ICA patent distally to the skull base without stenosis, dissection or occlusion. Left carotid system: Left CCA patent from its origin to the bifurcation without stenosis. Bulky calcified plaque seen about the left carotid bulb/proximal left ICA with associated stenosis of up to 70% by NASCET criteria. Left ICA patent distally to the skull base without stenosis, dissection or occlusion. Vertebral arteries: Both vertebral arteries arise from the subclavian arteries. No proximal subclavian artery stenosis. Atheromatous plaque at the origin of the left vertebral artery with associated severe ostial stenosis. Plaque at the origin of the right vertebral artery with no more than mild narrowing. Additional multifocal scattered calcified plaque within the V2 segments bilaterally with  associated mild to moderate multifocal stenoses. Vertebral arteries remain patent to the skull base without dissection or vascular occlusion. Skeleton: No visible acute osseous finding. No discrete or worrisome osseous lesions. Bulky anterior endplate osteophytic spurring noted at C4-5 through C6-7. Patient is edentulous. Other neck: No other acute soft tissue abnormality within the neck. No mass or adenopathy. Upper chest: Visualized upper chest demonstrates no acute finding. Left-sided pacemaker/AICD noted. 5 mm nodule noted at the superior segment of the right lower lobe, indeterminate. Review of the MIP images confirms the above findings CTA HEAD FINDINGS Anterior circulation: Petrous segments patent bilaterally. Extensive atheromatous plaque throughout the carotid siphons with associated moderate diffuse narrowing. A1 segments patent bilaterally. Normal anterior communicating artery complex. Anterior cerebral arteries patent to their distal aspects without stenosis. No M1 stenosis or occlusion. Normal MCA bifurcations. No proximal MCA branch occlusion. Distal MCA branches well perfused and symmetric. Posterior circulation: Both V4 segments patent to the vertebrobasilar junction without stenosis. Both PICA origins patent and normal. Basilar patent to its distal aspect without stenosis. Superior cerebral arteries patent bilaterally. Both PCAs primarily supplied via the basilar. Short-segment mild to moderate proximal left P2 stenosis noted. PCAs otherwise patent to their distal aspects without hemodynamically significant stenosis. Venous sinuses: Grossly patent allowing for timing the contrast bolus. Anatomic variants: None significant.  No intracranial aneurysm. Review of the MIP images confirms the above findings IMPRESSION: 1. Negative CTA for emergent large vessel occlusion. 2. Bulky calcified plaque about the carotid bifurcations/proximal ICAs, with associated stenoses of up to 70% by NASCET criteria  bilaterally. 3. Extensive atheromatous change throughout the carotid siphons with associated moderate diffuse narrowing. 4. 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 Additional scattered mild to moderate bilateral V2 stenoses. 5. 5 mm right lower lobe nodule, indeterminate. No follow-up needed if patient is low-risk. Non-contrast chest CT can be considered in 12 months if patient is high-risk. This recommendation follows the consensus statement: Guidelines for Management of Incidental Pulmonary Nodules Detected on CT Images: From the Fleischner Society 2017; Radiology 2017; 284:228-243. These results were communicated to Dr. Quinn Axe at 11:35 [place] 4/12/2022by text page via the [facility] messaging system. Electronically Signed   By: Jeannine Boga Dylan [name].D.   On: 02/09/2021 23:52   CT ANGIO NECK CODE STROKE  Result Date: 02/09/2021 CLINICAL DATA:  Initial evaluation for acute stroke, right gaze, left arm weakness, slurred speech. EXAM: CT ANGIOGRAPHY HEAD AND NECK TECHNIQUE: Multidetector CT imaging of the head and neck was performed using the standard protocol during bolus administration of intravenous contrast. Multiplanar CT image reconstructions and MIPs were obtained to evaluate the vascular anatomy. Carotid stenosis measurements (when applicable) are obtained utilizing NASCET criteria, using the distal internal carotid diameter as the  denominator. CONTRAST:  59mL OMNIPAQUE IOHEXOL 350 MG/ML SOLN COMPARISON:  Prior noncontrast head CT from earlier the same day. FINDINGS: CTA NECK FINDINGS Aortic arch: Visualized aortic arch normal in caliber with normal branch pattern. Moderate atheromatous change about the arch and origin of the great vessels without hemodynamically significant stenosis. Right carotid system: Scattered mixed plaque about the right CCA without high-grade stenosis. Extensive bulky calcified plaque about the right carotid bulb/proximal right ICA with associated stenosis of  up to 70% by NASCET criteria. Right ICA patent distally to the skull base without stenosis, dissection or occlusion. Left carotid system: Left CCA patent from its origin to the bifurcation without stenosis. Bulky calcified plaque seen about the left carotid bulb/proximal left ICA with associated stenosis of up to 70% by NASCET criteria. Left ICA patent distally to the skull base without stenosis, dissection or occlusion. Vertebral arteries: Both vertebral arteries arise from the subclavian arteries. No proximal subclavian artery stenosis. Atheromatous plaque at the origin of the left vertebral artery with associated severe ostial stenosis. Plaque at the origin of the right vertebral artery with no more than mild narrowing. Additional multifocal scattered calcified plaque within the V2 segments bilaterally with associated mild to moderate multifocal stenoses. Vertebral arteries remain patent to the skull base without dissection or vascular occlusion. Skeleton: No visible acute osseous finding. No discrete or worrisome osseous lesions. Bulky anterior endplate osteophytic spurring noted at C4-5 through C6-7. Patient is edentulous. Other neck: No other acute soft tissue abnormality within the neck. No mass or adenopathy. Upper chest: Visualized upper chest demonstrates no acute finding. Left-sided pacemaker/AICD noted. 5 mm nodule noted at the superior segment of the right lower lobe, indeterminate. Review of the MIP images confirms the above findings CTA HEAD FINDINGS Anterior circulation: Petrous segments patent bilaterally. Extensive atheromatous plaque throughout the carotid siphons with associated moderate diffuse narrowing. A1 segments patent bilaterally. Normal anterior communicating artery complex. Anterior cerebral arteries patent to their distal aspects without stenosis. No M1 stenosis or occlusion. Normal MCA bifurcations. No proximal MCA branch occlusion. Distal MCA branches well perfused and symmetric.  Posterior circulation: Both V4 segments patent to the vertebrobasilar junction without stenosis. Both PICA origins patent and normal. Basilar patent to its distal aspect without stenosis. Superior cerebral arteries patent bilaterally. Both PCAs primarily supplied via the basilar. Short-segment mild to moderate proximal left P2 stenosis noted. PCAs otherwise patent to their distal aspects without hemodynamically significant stenosis. Venous sinuses: Grossly patent allowing for timing the contrast bolus. Anatomic variants: None significant.  No intracranial aneurysm. Review of the MIP images confirms the above findings IMPRESSION: 1. Negative CTA for emergent large vessel occlusion. 2. 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 3. Extensive atheromatous change throughout the carotid siphons with associated moderate diffuse narrowing. 4. 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 Additional scattered mild to moderate bilateral V2 stenoses. 5. 5 mm right lower lobe nodule, indeterminate. No follow-up needed if patient is low-risk. Non-contrast chest CT can be considered in 12 months if patient is high-risk. This recommendation follows the consensus statement: Guidelines for Management of Incidental Pulmonary Nodules Detected on CT Images: From the Fleischner Society 2017; Radiology 2017; 284:228-243. These results were communicated to Dr. Quinn Axe at 11:35 [place] 4/12/2022by text page via the [facility] messaging system. Electronically Signed   By: Jeannine Boga Dylan [name].D.   On: 02/09/2021 23:52        Scheduled Meds: .  stroke: mapping our early stages of recovery book   Does not apply Once  . apixaban  5 mg Oral BID  . aspirin  81 mg Oral Daily  . carbidopa-levodopa  2 tablet Oral TID  . insulin aspart  0-6 Units Subcutaneous TID WC  . memantine  10 mg Oral BID  . pravastatin  20 mg Oral Daily   . rivastigmine  4.6 mg Transdermal Daily   Continuous Infusions:   LOS: 0 days        Edwin Dada, MD [facility] 02/10/2021, 7:49 PM     Please page though [place] or Epic secure chat:  For password, contact charge nurse

## 2021-02-10 NOTE — Progress Notes (Signed)
[facility]) Hospital Liaison note:  This patient is currently enrolled in [facility] outpatient-based Palliative Care. Will continue to follow for disposition.  Please call with any outpatient palliative questions or concerns.  Thank you, Lorelee Market, LPN [facility] [place] Liaison [phone]

## 2021-02-10 NOTE — Progress Notes (Addendum)
  Echocardiogram 2D Echocardiogram with bubble study and definity has been performed.   Dylan [name] M 02/10/2021, 1:49 PM

## 2021-02-10 NOTE — ED Provider Notes (Signed)
[facility] EMERGENCY DEPARTMENT Provider Note   CSN: [id number] Arrival date & time: 02/09/21  2301  An emergency department physician performed an initial assessment on this suspected stroke patient at 2301.  History Chief Complaint  Patient presents with  . Code Stroke    Dylan [name]. is a 80 y.o. male.  Patient is a 80 year old male with past medical history of complete heart block with pacemaker placement, chronic renal insufficiency, hypertension, hyperlipidemia, and Parkinson's.  Patient brought by EMS for evaluation of possible stroke.  Patient was sitting on the toilet this evening when he became aphasic and disoriented.  EMS was called and patient was found to have left-sided weakness.  A code stroke was initiated and patient transported here.  The history is provided by the patient.       Past Medical History:  Diagnosis Date  . Anxiety   . Aortic stenosis    mild AS 11/24/16 (peak grad 24, mean grad 11) Dr. [name]  . CHB (complete heart block) ([place]) 07/2017  . 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[place]) 07/11/2017  . Diabetes mellitus without complication ([place])   . Encounter for care of pacemaker 07/11/2020  . Gait abnormality   . Gout   . Hyperlipemia   . Hypertension   . 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 Memory loss   . Pacemaker Dual chamber [place] MRI  model [id number] 07/11/2020 06/08/2020  . Peripheral arterial disease ([place])   . 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 Second degree AV block    Wenckebach; no indication for pacemaker as of 12/01/16 (Dr. [name])  . Vitamin B12 deficiency anemia 03/01/2018  . Vitamin D deficiency disease     Patient Active Problem List   Diagnosis Date Noted  . Chronic constipation 12/24/2020  . Parkinson's disease ([place]) 12/24/2020  . Encounter for care of pacemaker 07/11/2020  . Pacemaker Dual chamber [place] MRI  model [id number] 07/11/2020 06/08/2020  . 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 DNR (do not resuscitate) 06/08/2020  . Elevated CK 04/27/2020  . Gout   . Hyperlipemia   . Parkinsonism ([place]) 01/29/2020  . 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 Dementia with behavioral disturbance ([place]) 05/22/2019  . Gait abnormality 05/22/2019  . AKI (acute kidney injury) ([place]) 07/14/2018  . Nephropathy 07/14/2018  . Other disorders of lung 07/14/2018  . Atherosclerosis of aorta ([place]) 07/14/2018  . Dehydration 07/14/2018  . 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 CKD (chronic kidney disease), stage III ([place]) 06/13/2018  . Syncope 06/13/2018  . 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[place]) 07/11/2017  . 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[place]) 06/23/2015  . Peripheral arterial disease ([place]) 05/13/2014  . Essential hypertension 05/13/2014  . Type 2 diabetes mellitus with stage 3a chronic kidney disease, without long-term current use of insulin ([place]) 05/13/2014    Past Surgical History:  Procedure Laterality Date  . CARDIOVASCULAR STRESS TEST     11/21/16 Low risk study, EF 52% [facility])  . EYE SURGERY    . KYPHOPLASTY N/A 02/15/2017   Procedure: LUMBAR FOUR KYPHOPLASTY;  Surgeon: Phylliss Bob, MD;  Location: [facility];  Service: Orthopedics;  Laterality: N/A;  . lipoma surgery     neck - 30 years ago  . PACEMAKER IMPLANT N/A 07/11/2017   Procedure: Pacemaker Implant;  Surgeon: Constance Haw, MD;  Location: [facility];  Service: Cardiovascular;  Laterality: N/A;  . TRANSTHORACIC ECHOCARDIOGRAM     11/24/16 [facility]): EF 55-60%, mild AS, mild-mod MR, mod TR, moderate pulm HTN, PAP 49 mmHg       Family History  Problem Relation Age of Onset  . Diabetes Mother   . Breast cancer Mother   . Arthritis Mother   . Hypertension Father   . Diabetes Sister   . Diabetes Brother   . Diabetes Maternal Grandmother   . Diabetes Brother   . Diabetes Brother    . Diabetes Brother   . Diabetes Sister   . Diabetes Sister   . Diabetes Sister     Social History   Tobacco Use  . Smoking status: Former Smoker    Packs/day: 0.50    Years: 7.00    Pack years: 3.50    Types: Cigarettes  . Smokeless tobacco: Never Used  . Tobacco comment: quit 35 years  Vaping Use  . Vaping Use: Never used  Substance Use Topics  . Alcohol use: No  . Drug use: No    Home Medications Prior to Admission medications   Medication Sig Start Date End Date Taking? Authorizing Provider  Ascorbic Acid (VITAMIN C) 1000 MG tablet Take 1,000 mg by mouth every morning.     [provider]  carbidopa-levodopa (SINEMET IR) 25-100 MG tablet Take 2 tablets by mouth 3 (three) times daily. 12/24/20   Marcial Pacas, MD  carvedilol (COREG) 12.5 MG tablet TAKE 1 TABLET BY MOUTH 2 TIMES DAILY. Patient taking differently: Take 12.5 mg by mouth 2 (two) times daily with a meal. 07/07/20   [name], MD  Cholecalciferol (VITAMIN D3) 5000 units CAPS Take 5,000 Units by mouth daily with supper.     [provider]  desonide (DESOWEN) 0.05 % lotion Apply topically 2 times per day prn 06/17/20   [name], MD  diclofenac Sodium (VOLTAREN) 1 % GEL Apply 2 g topically 3 (three) times daily. Patient taking differently: Apply 2 g topically as needed. 10/10/19   Rodriguez-Southworth, Sunday Spillers, PA-C  glucose blood (ONETOUCH VERIO) test strip Use as instructed to check blood sugars daily dx: e11.22 01/06/20   [name], MD  Lancet Devices (ONE TOUCH DELICA LANCING DEV) MISC Please fill ONE TOUCH DELICA LANCING DEVICE.  Use to test blood sugar twice daily as directed. E11.65 Patient taking differently: Please fill ONE TOUCH DELICA LANCING DEVICE.  Use to test blood sugar twice daily as directed. E11.65 11/15/19   [name], MD  Lancets MISC Please fill basic/generic push button lancets.  Patient to test blood sugar twice daily. DX: E11.65 12/06/19   [name], MD   linaclotide Rolan Lipa) 72 MCG capsule Take 1 capsule (72 mcg total) by mouth daily before breakfast. Patient not taking: Reported on 02/03/2021 12/19/20   [name], MD  Melatonin 10 MG TABS Take 20 mg by mouth at bedtime.    [provider]  memantine (NAMENDA) 10 MG tablet TAKE 1 TABLET BY MOUTH TWICE A DAY Patient taking differently: Take 10 mg by mouth 2 (two) times daily. 07/07/20   [name], MD  metFORMIN (GLUCOPHAGE) 1000 MG tablet Take 1 tablet by mouth daily 12/24/20   [name], MD  pravastatin (PRAVACHOL) 20 MG tablet TAKE 1 TABLET BY ORAL ROUTE EVERY DAY 12/01/20   [name], MD  QUEtiapine (SEROQUEL) 25 MG tablet TAKE 1 TABLET (25 MG TOTAL) BY MOUTH AT BEDTIME AS NEEDED. Patient not taking: Reported on 02/03/2021 08/21/20   Marcial Pacas,  MD  rivastigmine (EXELON) 4.6 mg/24hr Place 1 patch (4.6 mg total) onto the skin daily. 10/26/20   Suzzanne Cloud, NP  senna-docusate (SENOKOT-S) 8.6-50 MG tablet Take 1 tablet by mouth as needed for mild constipation.    [provider]  TRULICITY 1.5 PN/3.6RW SOPN INJECT 1.5 MG INTO THE SKIN ONCE A WEEK. Patient taking differently: Inject 1.5 mg into the skin every Monday. 09/07/20   [name], MD    Allergies    Patient has no known allergies.  Review of Systems   Review of Systems  Unable to perform ROS: Acuity of condition    Physical Exam Updated Vital Signs BP (!) 141/43   Pulse (!) 53   Temp 98.2 F (36.8 C) (Oral)   Resp 17   Wt 72.4 kg   SpO2 99%   BMI 25.76 kg/m   Physical Exam Vitals and nursing note reviewed.  Constitutional:      General: He is not in acute distress.    Appearance: He is well-developed. He is not diaphoretic.  HENT:     Head: Normocephalic and atraumatic.  Cardiovascular:     Rate and Rhythm: Normal rate and regular rhythm.     Heart sounds: No murmur heard. No friction rub.  Pulmonary:     Effort: Pulmonary effort is normal. No respiratory distress.     Breath  sounds: Normal breath sounds. No wheezing or rales.  Abdominal:     General: Bowel sounds are normal. There is no distension.     Palpations: Abdomen is soft.     Tenderness: There is no abdominal tenderness.  Musculoskeletal:        General: Normal range of motion.     Cervical back: Normal range of motion and neck supple.  Skin:    General: Skin is warm and dry.  Neurological:     Mental Status: He is alert.     Comments: Patient is awake and alert, but appears confused.  He has difficulty following commands and appears to have an expressive aphasia.  He moves all extremities, but strength is difficult to examine secondary to confusion.     ED Results / Procedures / Treatments   Labs (all labs ordered are listed, but only abnormal results are displayed) Labs Reviewed  CBC - Abnormal; Notable for the following components:      Result Value   Hemoglobin 12.9 (*)    All other components within normal limits  COMPREHENSIVE METABOLIC PANEL - Abnormal; Notable for the following components:   Glucose, Bld 163 (*)    Creatinine, Ser 1.28 (*)    GFR, Estimated 57 (*)    All other components within normal limits  CBG MONITORING, ED - Abnormal; Notable for the following components:   Glucose-Capillary 148 (*)    All other components within normal limits  I-STAT CHEM 8, ED - Abnormal; Notable for the following components:   BUN 24 (*)    Glucose, Bld 152 (*)    All other components within normal limits  RESP PANEL BY RT-PCR (FLU A&B, COVID) ARPGX2  ETHANOL  PROTIME-INR  APTT  DIFFERENTIAL  RAPID URINE DRUG SCREEN, HOSP PERFORMED  URINALYSIS, ROUTINE W REFLEX MICROSCOPIC  LDL CHOLESTEROL, DIRECT    EKG EKG Interpretation  Date/Time:  Tuesday February 09 2021 23:32:57 EDT Ventricular Rate:  71 PR Interval:    QRS Duration: 143 QT Interval:  448 QTC Calculation: 487 R Axis:   -60 Text Interpretation: Normal sinus rhythm Premature  atrial complexes IVCD, consider atypical RBBB  Left ventricular hypertrophy Anterior infarct, old Abnrm T, consider ischemia, anterolateral lds Confirmed by Veryl Speak [phone]) on 02/09/2021 11:53:47 PM   Radiology CT HEAD CODE STROKE WO CONTRAST  Result Date: 02/09/2021 CLINICAL DATA:  Code stroke. Initial evaluation for neuro deficit, stroke suspected. EXAM: CT HEAD WITHOUT CONTRAST TECHNIQUE: Contiguous axial images were obtained from the base of the skull through the vertex without intravenous contrast. COMPARISON:  Prior CT from 04/27/2020 FINDINGS: Brain: Moderately advanced age-related cerebral atrophy with chronic small vessel ischemic disease. No acute intracranial hemorrhage. Linear hyperdensity at the left basal ganglia most consistent with calcification/mineralization, stable from previous. No acute large vessel territory infarct. No mass lesion, midline shift or mass effect. Mild ventricular prominence related to global parenchymal volume loss of hydrocephalus. No extra-axial fluid collection. Vascular: No hyperdense vessel. Scattered vascular calcifications noted within the carotid siphons. Skull: Scalp soft tissues and calvarium within normal limits. Sinuses/Orbits: Globes and orbital soft tissues within normal limits. Paranasal sinuses are largely clear. Trace right mastoid effusion noted, of doubtful significance. Other: None. ASPECTS [facility] Stroke Program Early CT Score) - Ganglionic level infarction (caudate, lentiform nuclei, internal capsule, insula, M1-M3 cortex): 7 - Supraganglionic infarction (M4-M6 cortex): 3 Total score (0-10 with 10 being normal): 10 IMPRESSION: 1. No acute intracranial infarct or other abnormality. 2. ASPECTS is 10. 3. Moderately advanced cerebral atrophy with chronic small vessel ischemic disease. These results were communicated to Dr. Quinn Axe at 11:20 [place] 4/12/2022by text page via the [facility] messaging system. Electronically Signed   By: Jeannine Boga M.D.   On: 02/09/2021 23:24   CT ANGIO HEAD CODE  STROKE  Result Date: 02/09/2021 CLINICAL DATA:  Initial evaluation for acute stroke, right gaze, left arm weakness, slurred speech. EXAM: CT ANGIOGRAPHY HEAD AND NECK TECHNIQUE: Multidetector CT imaging of the head and neck was performed using the standard protocol during bolus administration of intravenous contrast. Multiplanar CT image reconstructions and MIPs were obtained to evaluate the vascular anatomy. Carotid stenosis measurements (when applicable) are obtained utilizing NASCET criteria, using the distal internal carotid diameter as the denominator. CONTRAST:  80mL OMNIPAQUE IOHEXOL 350 MG/ML SOLN COMPARISON:  Prior noncontrast head CT from earlier the same day. FINDINGS: CTA NECK FINDINGS Aortic arch: Visualized aortic arch normal in caliber with normal branch pattern. Moderate atheromatous change about the arch and origin of the great vessels without hemodynamically significant stenosis. Right carotid system: Scattered mixed plaque about the right CCA without high-grade stenosis. Extensive bulky calcified plaque about the right carotid bulb/proximal right ICA with associated stenosis of up to 70% by NASCET criteria. Right ICA patent distally to the skull base without stenosis, dissection or occlusion. Left carotid system: Left CCA patent from its origin to the bifurcation without stenosis. Bulky calcified plaque seen about the left carotid bulb/proximal left ICA with associated stenosis of up to 70% by NASCET criteria. Left ICA patent distally to the skull base without stenosis, dissection or occlusion. Vertebral arteries: Both vertebral arteries arise from the subclavian arteries. No proximal subclavian artery stenosis. Atheromatous plaque at the origin of the left vertebral artery with associated severe ostial stenosis. Plaque at the origin of the right vertebral artery with no more than mild narrowing. Additional multifocal scattered calcified plaque within the V2 segments bilaterally with associated  mild to moderate multifocal stenoses. Vertebral arteries remain patent to the skull base without dissection or vascular occlusion. Skeleton: No visible acute osseous finding. No discrete or worrisome osseous lesions. Bulky anterior endplate  osteophytic spurring noted at C4-5 through C6-7. Patient is edentulous. Other neck: No other acute soft tissue abnormality within the neck. No mass or adenopathy. Upper chest: Visualized upper chest demonstrates no acute finding. Left-sided pacemaker/AICD noted. 5 mm nodule noted at the superior segment of the right lower lobe, indeterminate. Review of the MIP images confirms the above findings CTA HEAD FINDINGS Anterior circulation: Petrous segments patent bilaterally. Extensive atheromatous plaque throughout the carotid siphons with associated moderate diffuse narrowing. A1 segments patent bilaterally. Normal anterior communicating artery complex. Anterior cerebral arteries patent to their distal aspects without stenosis. No M1 stenosis or occlusion. Normal MCA bifurcations. No proximal MCA branch occlusion. Distal MCA branches well perfused and symmetric. Posterior circulation: Both V4 segments patent to the vertebrobasilar junction without stenosis. Both PICA origins patent and normal. Basilar patent to its distal aspect without stenosis. Superior cerebral arteries patent bilaterally. Both PCAs primarily supplied via the basilar. Short-segment mild to moderate proximal left P2 stenosis noted. PCAs otherwise patent to their distal aspects without hemodynamically significant stenosis. Venous sinuses: Grossly patent allowing for timing the contrast bolus. Anatomic variants: None significant.  No intracranial aneurysm. Review of the MIP images confirms the above findings IMPRESSION: 1. Negative CTA for emergent large vessel occlusion. 2. 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 3.  Extensive atheromatous change throughout the carotid siphons with associated moderate diffuse narrowing. 4. 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 Additional scattered mild to moderate bilateral V2 stenoses. 5. 5 mm right lower lobe nodule, indeterminate. No follow-up needed if patient is low-risk. Non-contrast chest CT can be considered in 12 months if patient is high-risk. This recommendation follows the consensus statement: Guidelines for Management of Incidental Pulmonary Nodules Detected on CT Images: From the Fleischner Society 2017; Radiology 2017; 284:228-243. These results were communicated to Dr. Quinn Axe at 11:35 [place] 4/12/2022by text page via the [facility] messaging system. Electronically Signed   By: Jeannine Boga M.D.   On: 02/09/2021 23:52   CT ANGIO NECK CODE STROKE  Result Date: 02/09/2021 CLINICAL DATA:  Initial evaluation for acute stroke, right gaze, left arm weakness, slurred speech. EXAM: CT ANGIOGRAPHY HEAD AND NECK TECHNIQUE: Multidetector CT imaging of the head and neck was performed using the standard protocol during bolus administration of intravenous contrast. Multiplanar CT image reconstructions and MIPs were obtained to evaluate the vascular anatomy. Carotid stenosis measurements (when applicable) are obtained utilizing NASCET criteria, using the distal internal carotid diameter as the denominator. CONTRAST:  3mL OMNIPAQUE IOHEXOL 350 MG/ML SOLN COMPARISON:  Prior noncontrast head CT from earlier the same day. FINDINGS: CTA NECK FINDINGS Aortic arch: Visualized aortic arch normal in caliber with normal branch pattern. Moderate atheromatous change about the arch and origin of the great vessels without hemodynamically significant stenosis. Right carotid system: Scattered mixed plaque about the right CCA without high-grade stenosis. Extensive bulky calcified plaque about the right carotid bulb/proximal right ICA with associated stenosis of up to 70% by  NASCET criteria. Right ICA patent distally to the skull base without stenosis, dissection or occlusion. Left carotid system: Left CCA patent from its origin to the bifurcation without stenosis. Bulky calcified plaque seen about the left carotid bulb/proximal left ICA with associated stenosis of up to 70% by NASCET criteria. Left ICA patent distally to the skull base without stenosis, dissection or occlusion. Vertebral arteries: Both vertebral arteries arise from the subclavian arteries. No proximal subclavian artery stenosis.  Atheromatous plaque at the origin of the left vertebral artery with associated severe ostial stenosis. Plaque at the origin of the right vertebral artery with no more than mild narrowing. Additional multifocal scattered calcified plaque within the V2 segments bilaterally with associated mild to moderate multifocal stenoses. Vertebral arteries remain patent to the skull base without dissection or vascular occlusion. Skeleton: No visible acute osseous finding. No discrete or worrisome osseous lesions. Bulky anterior endplate osteophytic spurring noted at C4-5 through C6-7. Patient is edentulous. Other neck: No other acute soft tissue abnormality within the neck. No mass or adenopathy. Upper chest: Visualized upper chest demonstrates no acute finding. Left-sided pacemaker/AICD noted. 5 mm nodule noted at the superior segment of the right lower lobe, indeterminate. Review of the MIP images confirms the above findings CTA HEAD FINDINGS Anterior circulation: Petrous segments patent bilaterally. Extensive atheromatous plaque throughout the carotid siphons with associated moderate diffuse narrowing. A1 segments patent bilaterally. Normal anterior communicating artery complex. Anterior cerebral arteries patent to their distal aspects without stenosis. No M1 stenosis or occlusion. Normal MCA bifurcations. No proximal MCA branch occlusion. Distal MCA branches well perfused and symmetric. Posterior  circulation: Both V4 segments patent to the vertebrobasilar junction without stenosis. Both PICA origins patent and normal. Basilar patent to its distal aspect without stenosis. Superior cerebral arteries patent bilaterally. Both PCAs primarily supplied via the basilar. Short-segment mild to moderate proximal left P2 stenosis noted. PCAs otherwise patent to their distal aspects without hemodynamically significant stenosis. Venous sinuses: Grossly patent allowing for timing the contrast bolus. Anatomic variants: None significant.  No intracranial aneurysm. Review of the MIP images confirms the above findings IMPRESSION: 1. Negative CTA for emergent large vessel occlusion. 2. 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 3. Extensive atheromatous change throughout the carotid siphons with associated moderate diffuse narrowing. 4. 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 Additional scattered mild to moderate bilateral V2 stenoses. 5. 5 mm right lower lobe nodule, indeterminate. No follow-up needed if patient is low-risk. Non-contrast chest CT can be considered in 12 months if patient is high-risk. This recommendation follows the consensus statement: Guidelines for Management of Incidental Pulmonary Nodules Detected on CT Images: From the Fleischner Society 2017; Radiology 2017; 284:228-243. These results were communicated to Dr. Quinn Axe at 11:35 [place] 4/12/2022by text page via the [facility] messaging system. Electronically Signed   By: Jeannine Boga M.D.   On: 02/09/2021 23:52    Procedures Procedures   Medications Ordered in ED Medications  aspirin chewable tablet 81 mg (81 mg Oral Given 02/10/21 0216)    Or  aspirin suppository 300 mg ( Rectal See Alternative 02/10/21 0216)  iohexol (OMNIPAQUE) 350 MG/ML injection 75 mL (75 mLs Intravenous Contrast Given 02/09/21 2322)    ED Course  I  have reviewed the triage vital signs and the nursing notes.  Pertinent labs & imaging results that were available during my care of the patient were reviewed by me and considered in my medical decision making (see chart for details).    MDM Rules/Calculators/A&P  Patient arrived here as a code stroke for the above reasons.  He was seen immediately upon presentation by myself and Dr. Quinn Axe from neurology.  He then went for a head CT showing no acute process, followed by CTA of the head and neck, which also showed no evidence for large vessel occlusion.  Remainder laboratory studies essentially unremarkable.  His mental status  did seem to improve as his ED course carried on.  He is now responding to questions appropriately and following commands, when he was not initially upon presentation.  Patient to be admitted to the hospitalist service for further work-up.  I have spoken with Dr. Velia Meyer who agrees to admit.  CRITICAL CARE Performed by: Veryl Speak Total critical care time: 35 minutes Critical care time was exclusive of separately billable procedures and treating other patients. Critical care was necessary to treat or prevent imminent or life-threatening deterioration. Critical care was time spent personally by me on the following activities: development of treatment plan with patient and/or surrogate as well as nursing, discussions with consultants, evaluation of patient's response to treatment, examination of patient, obtaining history from patient or surrogate, ordering and performing treatments and interventions, ordering and review of laboratory studies, ordering and review of radiographic studies, pulse oximetry and re-evaluation of patient's condition.   Final Clinical Impression(s) / ED Diagnoses Final diagnoses:  Altered mental status, unspecified altered mental status type    Rx / DC Orders ED Discharge Orders    None       Veryl Speak, MD 02/10/21 [phone]

## 2021-02-10 NOTE — Progress Notes (Signed)
I called [name] for telemedicine telephonic PC f/u visit for Mr. Giraldo, [name] endorses Mr. Fohl is currently hospitalized she found him unresponsive this am. Discussed will notified Hospice liasion of hospitalization. [name] in agreement

## 2021-02-10 NOTE — ED Notes (Signed)
Pt transported to MRI with SWOT nurse 

## 2021-02-10 NOTE — Progress Notes (Signed)
OT Cancellation Note  Patient Details Name: Dylan [name]. MRN: [id number] DOB: 1941-05-04   Cancelled Treatment:    Reason Eval/Treat Not Completed: Patient at procedure or test/ unavailable (at MRI. OT to continue to follow pt for OT eval.)   Jefferey Pica, OTR/L Acute Rehabilitation Services Pager: [phone] Office: [phone]   Nohemy Koop C 02/10/2021, 11:00 AM

## 2021-02-10 NOTE — Evaluation (Signed)
Occupational Therapy Evaluation Patient Details Name: Dylan [name]. MRN: [id number] DOB: 1940-11-19 Today's Date: 02/10/2021    History of Present Illness 80 yo male with onset of L side weakness was brought to ED, and found no stroke definitive findings yet.  MRI and echo bubble study pending.  PMHx:  dementia, Parkinson's like symptoms, DM, HTN, B12 defic, atherosclerosis, vertebral artery stenosis, pacemaker, complete AV block,   Clinical Impression   Pt PTA: pt is a poor historian, but does relay that family assists with transfers and ADL tasks. Pt currently, set-upA to [place] for ADL and minA to [place] for mobility. Pt sit to stand with maxA taking a few steps to recliner and back to bed as pt unable to flex knee for proper transfer. Pt would benefit from continued OT skilled services for ADL, mobility and safety in SNF setting. OT following acutely. *Pt could return home if family can assist with maxA transfers or lateral scoots to w/c and maxA overall for ADL with 24/7 [place]- unable to discuss as family not present.     Follow Up Recommendations  SNF;Supervision/Assistance - 24 hour    Equipment Recommendations  Wheelchair (measurements OT);Wheelchair cushion (measurements OT);Hospital bed    Recommendations for Other Services       Precautions / Restrictions Precautions Precautions: Fall Precaution Comments: R side lean in sitting Restrictions Weight Bearing Restrictions: No      Mobility Bed Mobility Overal bed mobility: Needs Assistance Bed Mobility: Supine to Sit;Sit to Supine     Supine to sit: Mod assist Sit to supine: Mod assist   General bed mobility comments: ModA for mobility in bed; maxA for scooting to EOB    Transfers Overall transfer level: Needs assistance Equipment used: 1 person hand held assist Transfers: Sit to/from [employer] Transfers Sit to Stand: Max assist;From elevated surface Stand pivot transfers: Max assist        General transfer comment: Pt requiring maxA for power up due to lack of hip flexion and posterior lean. Pt held onto OTRs elbows for front to front sit to stand and transfer x2 times. Pt unable to bend knees so pt returned to bed    Balance Overall balance assessment: Needs assistance Sitting-balance support: Bilateral upper extremity supported;Feet supported Sitting balance-Leahy Scale: Poor Sitting balance - Comments: posterior lean requiring minA to maxA for stability Postural control: Posterior lean   Standing balance-Leahy Scale: Poor Standing balance comment: posterior lean, but once stabilized, pt standing upright x1 minute x2 times                           ADL either performed or assessed with clinical judgement   ADL Overall ADL's : Needs assistance/impaired Eating/Feeding: Minimal assistance;Set up;Bed level Eating/Feeding Details (indicate cue type and reason): set-upA with spoon, but takes increased effort and likely spills on self; minA for guiding RUE toward his mouth. Grooming: Moderate assistance;Sitting;Bed level   Upper Body Bathing: Maximal assistance;Sitting;Bed level   Lower Body Bathing: Total assistance;Sitting/lateral leans;Sit to/from stand;Bed level;+2 for physical assistance   Upper Body Dressing : Maximal assistance;Sitting;Bed level   Lower Body Dressing: Total assistance;Sitting/lateral leans;Sit to/from stand;+2 for physical assistance   Toilet Transfer: Maximal assistance;Cueing for safety;Cueing for sequencing;Stand-pivot;BSC Toilet Transfer Details (indicate cue type and reason): Only had +1 assist so near [place], but would be safer with +2 assist Toileting- Clothing Manipulation and Hygiene: Maximal assistance;Sitting/lateral lean;Sit to/from stand       Functional mobility  during ADLs: Maximal assistance;+2 for physical assistance;+2 for safety/equipment (assist for sit to stand; bed mobility) General ADL Comments: Pt limited by  decreased cognition for command following and sequencing; decreased strength; decreased ability to care for self and decreasedmobility. Pt sit to stand with near [place] to     Vision Baseline Vision/History: No visual deficits Patient Visual Report: No change from baseline Vision Assessment?: Vision impaired- to be further tested in functional context Additional Comments: continue to assess     Perception     Praxis      Pertinent Vitals/Pain Pain Assessment: No/denies pain     Hand Dominance Right   Extremity/Trunk Assessment Upper Extremity Assessment Upper Extremity Assessment: Generalized weakness;RUE deficits/detail;LUE deficits/detail RUE Deficits / Details: AROM, WFLs below shoulder, fair grip strength LUE Deficits / Details: decreased ROM, decreased grip strength and decreased strength 2/5 MMT   Lower Extremity Assessment Lower Extremity Assessment: Generalized weakness   Cervical / Trunk Assessment Cervical / Trunk Assessment: Kyphotic   Communication Communication Communication: Expressive difficulties;Other (comment) (speech not clear)   Cognition Arousal/Alertness: Awake/alert Behavior During Therapy: Flat affect Overall Cognitive Status: History of cognitive impairments - at baseline                                 General Comments: Pt is a poor historian. No family in room. Pt answering simple questions, but not always able to follow commands (50% of time) and resistive to movement at EOB at hips. Pt with cognitive defcits at baseline and unable to report on PLOF.   General Comments  Pt wants to go home, but OTR briefly mentioning need for physical assist at home- only having daughter and wife could be difficult.    Exercises Exercises: Other exercises Other Exercises Other Exercises: dynamic balance sitting x5 mins at EOB   Shoulder Instructions      Home Living Family/patient expects to be discharged to:: Private residence Living  Arrangements: Spouse/significant other Available Help at Discharge: Family;Available 24 hours/day                             Additional Comments: has stairs to enter per pt      Prior Functioning/Environment Level of Independence: Needs assistance  Gait / Transfers Assistance Needed: Wife or daughter assists pt into w/c ADL's / 65 Assistance Needed: Pt reports that he can feed himself, "but I make a mess."   Comments: pt reports wife helps him into wheelchair        OT Problem List: Decreased strength;Impaired balance (sitting and/or standing);Decreased activity tolerance;Decreased cognition;Decreased safety awareness;Pain;Cardiopulmonary status limiting activity;Impaired UE functional use;Decreased coordination      OT Treatment/Interventions: Self-care/ADL training;Therapeutic exercise;Energy conservation;DME and/or AE instruction;Therapeutic activities;Patient/family education;Balance training;Cognitive remediation/compensation    OT Goals(Current goals can be found in the care plan section) Acute Rehab OT Goals Patient Stated Goal: to get home OT Goal Formulation: Patient unable to participate in goal setting Time For Goal Achievement: 02/24/21 Potential to Achieve Goals: Good ADL Goals Pt Will Perform Eating: with set-up;bed level;sitting;with adaptive utensils Pt Will Perform Grooming: with min guard assist;sitting Pt Will Transfer to Toilet: with mod assist;stand pivot transfer;bedside commode Pt/caregiver will Perform Home Exercise Program: Increased strength;Both right and left upper extremity;With minimal assist;With written HEP provided;With theraband Additional ADL Goal #1: Pt will follow 1 step commands consistently 100% of the time. Additional ADL Goal #2:  Pt will perform x4 mins of ADL functional tasks with minimal cues to sequence through tasks.  OT Frequency: Min 2X/week   Barriers to D/C:            Co-evaluation               AM-PAC OT "6 Clicks" Daily Activity     Outcome Measure Help from another person eating meals?: A Little Help from another person taking care of personal grooming?: A Lot Help from another person toileting, which includes using toliet, bedpan, or urinal?: Total Help from another person bathing (including washing, rinsing, drying)?: A Lot Help from another person to put on and taking off regular upper body clothing?: A Lot Help from another person to put on and taking off regular lower body clothing?: Total 6 Click Score: 11   End of Session Equipment Utilized During Treatment: Gait belt Nurse Communication: Mobility status;Other (comment) (RN- pt requiring condom catheter)  Activity Tolerance: Patient tolerated treatment well Patient left: in bed;with call bell/phone within reach;with bed alarm set  OT Visit Diagnosis: Muscle weakness (generalized) (M62.81);Unsteadiness on feet (R26.81)                Time: 6681-5947 OT Time Calculation (min): 45 min Charges:  OT General Charges $OT Visit: 1 Visit OT Evaluation $OT Eval Moderate Complexity: 1 Mod OT Treatments $Self Care/Home Management : 8-22 mins $Therapeutic Activity: 8-22 mins  Jefferey Pica, OTR/L Acute Rehabilitation Services Pager: [phone] Office: [phone]   Vinnie Bobst C 02/10/2021, 5:46 PM

## 2021-02-10 NOTE — ED Notes (Signed)
Md delo made aware pt did not pass swallow screen. Pt gargled the water and paused

## 2021-02-10 NOTE — Progress Notes (Signed)
Patient to MRI from ED with SWOT RN. Patient has St. Jude device. Transmission sent. Patient programmed DJS [id number]. Knierim will come post scan to update thresholds on patient.

## 2021-02-10 NOTE — ED Notes (Signed)
SWOT nurse aware of pt needing monitoring at MRI, will call back

## 2021-02-10 NOTE — Consult Note (Addendum)
CARDIOLOGY CONSULT NOTE  Patient ID: Dylan [name]. MRN: [id number] DOB/AGE: [birth year] 80 y.o.  Admit date: 02/09/2021 Referring Physician: Antony Contras, MD Reason for Consultation: Atrial fibrillation  HPI:   80 y.o. 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  Patient was brought to [facility] emergency room after being found unresponsive at home, along with left-sided weakness.  Patient was evaluated by neurology and was thought to have TIA likely hemodynamic in the setting of syncopal event and hypotension.  Pacemaker interrogation reviewed that showed multiple episodes of A. fib in the last few days, without any rapid ventricular rate.  No other arrhythmias noted.  Patient is largely bedbound, and unable to provide me any significant history.  I spoke with patient's wife [name] over the phone.  She tells me that his activity is very limited to occasional use of wheelchair.  She denies any recent falls or bleeding issues.   Past Medical History:  Diagnosis Date  . Anxiety   . Aortic stenosis    mild AS 11/24/16 (peak grad 24, mean grad 11) Dr. [name]  . CHB (complete heart block) ([place]) 07/2017  . 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[place]) 07/11/2017  . Diabetes mellitus without complication ([place])   . Encounter for care of pacemaker 07/11/2020  . Gait abnormality   . Gout   . Hyperlipemia   . Hypertension   . 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 Memory loss   . Pacemaker Dual chamber [place] MRI  model [id number] 07/11/2020 06/08/2020  . Peripheral arterial disease ([place])   . 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 Second degree AV block    Wenckebach; no  indication for pacemaker as of 12/01/16 (Dr. [name])  . Vitamin B12 deficiency anemia 03/01/2018  . Vitamin D deficiency disease      Past Surgical History:  Procedure Laterality Date  . CARDIOVASCULAR STRESS TEST     11/21/16 Low risk study, EF 52% [facility])  . EYE SURGERY    . KYPHOPLASTY N/A 02/15/2017   Procedure: LUMBAR FOUR KYPHOPLASTY;  Surgeon: Phylliss Bob, MD;  Location: [place];  Service: Orthopedics;  Laterality: N/A;  . lipoma surgery     neck - 30 years ago  . PACEMAKER IMPLANT N/A 07/11/2017   Procedure: Pacemaker Implant;  Surgeon: Constance Haw, MD;  Location: [place] [facility];  Service: Cardiovascular;  Laterality: N/A;  . TRANSTHORACIC ECHOCARDIOGRAM     11/24/16 [facility]): EF 55-60%, mild AS, mild-mod MR, mod TR, moderate pulm HTN, PAP 49 mmHg      Family History  Problem Relation Age of Onset  . Diabetes Mother   . Breast cancer Mother   . Arthritis Mother   . Hypertension Father   . Diabetes Sister   . Diabetes Brother   . Diabetes Maternal Grandmother   . Diabetes Brother   . Diabetes Brother   . Diabetes Brother   . Diabetes Sister   . Diabetes Sister   . Diabetes Sister      Social History: Social History   Socioeconomic History  . Marital status: Widowed    Spouse name: Not on file  . Number of children: 3  . Years of education: 87  . Highest education level: High  school graduate  Occupational History  . Occupation: retired  Tobacco Use  . Smoking status: Former Smoker    Packs/day: 0.50    Years: 7.00    Pack years: 3.50    Types: Cigarettes  . Smokeless tobacco: Never Used  . Tobacco comment: quit 35 years  Vaping Use  . Vaping Use: Never used  Substance and Sexual Activity  . Alcohol use: No  . Drug use: No  . Sexual activity: Not Currently  Other Topics Concern  . Not on file  Social History Narrative   Lives at home with his fiancee.   Right-handed.   No daily use of caffeine.      Social  Determinants of Health   Financial Resource Strain: Low Risk   . Difficulty of Paying Living Expenses: Not hard at all  Food Insecurity: No Food Insecurity  . Worried About Charity fundraiser in the Last Year: Never true  . Ran Out of Food in the Last Year: Never true  Transportation Needs: No Transportation Needs  . Lack of Transportation (Medical): No  . Lack of Transportation (Non-Medical): No  Physical Activity: Sufficiently Active  . Days of Exercise per Week: 3 days  . Minutes of Exercise per Session: 60 min  Stress: No Stress Concern Present  . Feeling of Stress : Not at all  Social Connections: Not on file  Intimate Partner Violence: Not on file     Medications Prior to Admission  Medication Sig Dispense Refill Last Dose  . Ascorbic Acid (VITAMIN C) 1000 MG tablet Take 1,000 mg by mouth every morning.      . carbidopa-levodopa (SINEMET IR) 25-100 MG tablet Take 2 tablets by mouth 3 (three) times daily. 180 tablet 11   . carvedilol (COREG) 12.5 MG tablet TAKE 1 TABLET BY MOUTH 2 TIMES DAILY. (Patient taking differently: Take 12.5 mg by mouth 2 (two) times daily with a meal.) 60 tablet 11   . Cholecalciferol (VITAMIN D3) 5000 units CAPS Take 5,000 Units by mouth daily with supper.      . desonide (DESOWEN) 0.05 % lotion Apply topically 2 times per day prn 59 mL 1   . diclofenac Sodium (VOLTAREN) 1 % GEL Apply 2 g topically 3 (three) times daily. (Patient taking differently: Apply 2 g topically as needed.) 150 g 0   . glucose blood (ONETOUCH VERIO) test strip Use as instructed to check blood sugars daily dx: e11.22 150 each 3   . Lancet Devices (ONE TOUCH DELICA LANCING DEV) MISC Please fill ONE TOUCH DELICA LANCING DEVICE.  Use to test blood sugar twice daily as directed. E11.65 (Patient taking differently: Please fill ONE TOUCH DELICA LANCING DEVICE.  Use to test blood sugar twice daily as directed. E11.65) 1 each 1   . Lancets MISC Please fill basic/generic push button lancets.   Patient to test blood sugar twice daily. DX: E11.65 100 each 4   . linaclotide (LINZESS) 72 MCG capsule Take 1 capsule (72 mcg total) by mouth daily before breakfast. (Patient not taking: Reported on 02/03/2021) 30 capsule 0   . Melatonin 10 MG TABS Take 20 mg by mouth at bedtime.     . memantine (NAMENDA) 10 MG tablet TAKE 1 TABLET BY MOUTH TWICE A DAY (Patient taking differently: Take 10 mg by mouth 2 (two) times daily.) 60 tablet 14   . metFORMIN (GLUCOPHAGE) 1000 MG tablet Take 1 tablet by mouth daily 90 tablet 1   . pravastatin (PRAVACHOL) 20 MG  tablet TAKE 1 TABLET BY ORAL ROUTE EVERY DAY 30 tablet 2   . QUEtiapine (SEROQUEL) 25 MG tablet TAKE 1 TABLET (25 MG TOTAL) BY MOUTH AT BEDTIME AS NEEDED. (Patient not taking: Reported on 02/03/2021) 90 tablet 1   . rivastigmine (EXELON) 4.6 mg/24hr Place 1 patch (4.6 mg total) onto the skin daily. 30 patch 12   . senna-docusate (SENOKOT-S) 8.6-50 MG tablet Take 1 tablet by mouth as needed for mild constipation.     . TRULICITY 1.5 WU/9.8JX SOPN INJECT 1.5 MG INTO THE SKIN ONCE A WEEK. (Patient taking differently: Inject 1.5 mg into the skin every Monday.) 12 mL 2     Review of Systems  Unable to perform ROS: dementia  Significantly limited due to patient's dementia.  Denies any pain at this time.    Physical Exam: Physical Exam Vitals and nursing note reviewed.  Constitutional:      General: He is not in acute distress.    Appearance: He is well-developed.  HENT:     Head: Normocephalic and atraumatic.  Eyes:     Conjunctiva/sclera: Conjunctivae normal.     Pupils: Pupils are equal, round, and reactive to light.  Neck:     Vascular: No JVD.  Cardiovascular:     Rate and Rhythm: Normal rate and regular rhythm.     Pulses: Normal pulses and intact distal pulses.     Heart sounds: No murmur heard.   Pulmonary:     Effort: Pulmonary effort is normal.     Breath sounds: Normal breath sounds. No wheezing or rales.  Abdominal:     General:  Bowel sounds are normal.     Palpations: Abdomen is soft.     Tenderness: There is no rebound.  Musculoskeletal:        General: No tenderness. Normal range of motion.     Right lower leg: No edema.     Left lower leg: No edema.  Lymphadenopathy:     Cervical: No cervical adenopathy.  Skin:    General: Skin is warm and dry.  Neurological:     Mental Status: He is alert.     Cranial Nerves: No cranial nerve deficit.     Comments: Alert, awake, oriented to place. Left upper extremity mild weakness      Labs:   Lab Results  Component Value Date   WBC 5.2 02/10/2021   HGB 12.8 (L) 02/10/2021   HCT 39.3 02/10/2021   MCV 87.9 02/10/2021   PLT 169 02/10/2021    Recent Labs  Lab 02/09/21 2305 02/09/21 2313 02/10/21 0440  NA 139   < > 140  K 4.0   < > 4.0  CL 104   < > 107  CO2 28  --  26  BUN 21   < > 22  CREATININE 1.28*   < > 1.14  CALCIUM 9.4  --  9.6  PROT 6.7  --   --   BILITOT 0.8  --   --   ALKPHOS 83  --   --   ALT <5  --   --   AST 17  --   --   GLUCOSE 163*   < > 144*   < > = values in this interval not displayed.    Lipid Panel     Component Value Date/Time   CHOL 126 11/03/2020 1410   TRIG 52 11/03/2020 1410   HDL 49 11/03/2020 1410   CHOLHDL 2.6 11/03/2020 1410  LDLCALC 65 11/03/2020 1410    BNP (last 3 results) No results for input(s): BNP in the last 8760 hours.  HEMOGLOBIN A1C Lab Results  Component Value Date   HGBA1C 6.6 (H) 02/03/2021   MPG 163 02/09/2017    Cardiac Panel (last 3 results) Recent Labs    04/27/20 1556 04/28/20 0021 05/01/20 0427  CKTOTAL 3,228* 2,382* 275    Lab Results  Component Value Date   CKTOTAL 275 05/01/2020     TSH Recent Labs    04/27/20 1844 02/10/21 0440  TSH 1.679 0.447      Radiology: MR BRAIN WO CONTRAST  Result Date: 02/10/2021 CLINICAL DATA:  Right gaze preference and decreased left grip strength. EXAM: MRI HEAD WITHOUT CONTRAST TECHNIQUE: Multiplanar, multiecho pulse  sequences of the brain and surrounding structures were obtained without intravenous contrast. COMPARISON:  Head CT and CTA 02/09/2021 FINDINGS: Brain: There is no evidence of an acute infarct, intracranial hemorrhage, mass, midline shift, or extra-axial fluid collection. T2 hyperintensities in the cerebral white matter bilaterally are nonspecific but compatible with mild chronic small vessel ischemic disease. There is moderate generalized cerebral atrophy. Vascular: Major intracranial vascular flow voids are preserved. Skull and upper cervical spine: Unremarkable bone marrow signal. Sinuses/Orbits: Bilateral cataract extraction. Clear paranasal sinuses. Small right mastoid effusion. Other: None. IMPRESSION: 1. No acute intracranial abnormality. 2. Mild chronic small vessel ischemic disease and moderate cerebral atrophy. Electronically Signed   By: Logan Bores M.D.   On: 02/10/2021 11:22   CT HEAD CODE STROKE WO CONTRAST  Result Date: 02/09/2021 CLINICAL DATA:  Code stroke. Initial evaluation for neuro deficit, stroke suspected. EXAM: CT HEAD WITHOUT CONTRAST TECHNIQUE: Contiguous axial images were obtained from the base of the skull through the vertex without intravenous contrast. COMPARISON:  Prior CT from 04/27/2020 FINDINGS: Brain: Moderately advanced age-related cerebral atrophy with chronic small vessel ischemic disease. No acute intracranial hemorrhage. Linear hyperdensity at the left basal ganglia most consistent with calcification/mineralization, stable from previous. No acute large vessel territory infarct. No mass lesion, midline shift or mass effect. Mild ventricular prominence related to global parenchymal volume loss of hydrocephalus. No extra-axial fluid collection. Vascular: No hyperdense vessel. Scattered vascular calcifications noted within the carotid siphons. Skull: Scalp soft tissues and calvarium within normal limits. Sinuses/Orbits: Globes and orbital soft tissues within normal limits.  Paranasal sinuses are largely clear. Trace right mastoid effusion noted, of doubtful significance. Other: None. ASPECTS [facility] Stroke Program Early CT Score) - Ganglionic level infarction (caudate, lentiform nuclei, internal capsule, insula, M1-M3 cortex): 7 - Supraganglionic infarction (M4-M6 cortex): 3 Total score (0-10 with 10 being normal): 10 IMPRESSION: 1. No acute intracranial infarct or other abnormality. 2. ASPECTS is 10. 3. Moderately advanced cerebral atrophy with chronic small vessel ischemic disease. These results were communicated to Dr. Quinn Axe at 11:20 [place] 4/12/2022by text page via the [facility] messaging system. Electronically Signed   By: Jeannine Boga M.D.   On: 02/09/2021 23:24   CT ANGIO HEAD CODE STROKE  Result Date: 02/09/2021 CLINICAL DATA:  Initial evaluation for acute stroke, right gaze, left arm weakness, slurred speech. EXAM: CT ANGIOGRAPHY HEAD AND NECK TECHNIQUE: Multidetector CT imaging of the head and neck was performed using the standard protocol during bolus administration of intravenous contrast. Multiplanar CT image reconstructions and MIPs were obtained to evaluate the vascular anatomy. Carotid stenosis measurements (when applicable) are obtained utilizing NASCET criteria, using the distal internal carotid diameter as the denominator. CONTRAST:  52mL OMNIPAQUE IOHEXOL 350 MG/ML SOLN COMPARISON:  Prior noncontrast head CT from earlier the same day. FINDINGS: CTA NECK FINDINGS Aortic arch: Visualized aortic arch normal in caliber with normal branch pattern. Moderate atheromatous change about the arch and origin of the great vessels without hemodynamically significant stenosis. Right carotid system: Scattered mixed plaque about the right CCA without high-grade stenosis. Extensive bulky calcified plaque about the right carotid bulb/proximal right ICA with associated stenosis of up to 70% by NASCET criteria. Right ICA patent distally to the skull base without stenosis,  dissection or occlusion. Left carotid system: Left CCA patent from its origin to the bifurcation without stenosis. Bulky calcified plaque seen about the left carotid bulb/proximal left ICA with associated stenosis of up to 70% by NASCET criteria. Left ICA patent distally to the skull base without stenosis, dissection or occlusion. Vertebral arteries: Both vertebral arteries arise from the subclavian arteries. No proximal subclavian artery stenosis. Atheromatous plaque at the origin of the left vertebral artery with associated severe ostial stenosis. Plaque at the origin of the right vertebral artery with no more than mild narrowing. Additional multifocal scattered calcified plaque within the V2 segments bilaterally with associated mild to moderate multifocal stenoses. Vertebral arteries remain patent to the skull base without dissection or vascular occlusion. Skeleton: No visible acute osseous finding. No discrete or worrisome osseous lesions. Bulky anterior endplate osteophytic spurring noted at C4-5 through C6-7. Patient is edentulous. Other neck: No other acute soft tissue abnormality within the neck. No mass or adenopathy. Upper chest: Visualized upper chest demonstrates no acute finding. Left-sided pacemaker/AICD noted. 5 mm nodule noted at the superior segment of the right lower lobe, indeterminate. Review of the MIP images confirms the above findings CTA HEAD FINDINGS Anterior circulation: Petrous segments patent bilaterally. Extensive atheromatous plaque throughout the carotid siphons with associated moderate diffuse narrowing. A1 segments patent bilaterally. Normal anterior communicating artery complex. Anterior cerebral arteries patent to their distal aspects without stenosis. No M1 stenosis or occlusion. Normal MCA bifurcations. No proximal MCA branch occlusion. Distal MCA branches well perfused and symmetric. Posterior circulation: Both V4 segments patent to the vertebrobasilar junction without stenosis.  Both PICA origins patent and normal. Basilar patent to its distal aspect without stenosis. Superior cerebral arteries patent bilaterally. Both PCAs primarily supplied via the basilar. Short-segment mild to moderate proximal left P2 stenosis noted. PCAs otherwise patent to their distal aspects without hemodynamically significant stenosis. Venous sinuses: Grossly patent allowing for timing the contrast bolus. Anatomic variants: None significant.  No intracranial aneurysm. Review of the MIP images confirms the above findings IMPRESSION: 1. Negative CTA for emergent large vessel occlusion. 2. 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 3. Extensive atheromatous change throughout the carotid siphons with associated moderate diffuse narrowing. 4. 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 Additional scattered mild to moderate bilateral V2 stenoses. 5. 5 mm right lower lobe nodule, indeterminate. No follow-up needed if patient is low-risk. Non-contrast chest CT can be considered in 12 months if patient is high-risk. This recommendation follows the consensus statement: Guidelines for Management of Incidental Pulmonary Nodules Detected on CT Images: From the Fleischner Society 2017; Radiology 2017; 284:228-243. These results were communicated to Dr. Quinn Axe at 11:35 [place] 4/12/2022by text page via the [facility] messaging system. Electronically Signed   By: Jeannine Boga M.D.   On: 02/09/2021 23:52   CT ANGIO NECK CODE STROKE  Result Date: 02/09/2021 CLINICAL DATA:  Initial evaluation for acute stroke, right  gaze, left arm weakness, slurred speech. EXAM: CT ANGIOGRAPHY HEAD AND NECK TECHNIQUE: Multidetector CT imaging of the head and neck was performed using the standard protocol during bolus administration of intravenous contrast. Multiplanar CT image reconstructions and MIPs were obtained to evaluate  the vascular anatomy. Carotid stenosis measurements (when applicable) are obtained utilizing NASCET criteria, using the distal internal carotid diameter as the denominator. CONTRAST:  41mL OMNIPAQUE IOHEXOL 350 MG/ML SOLN COMPARISON:  Prior noncontrast head CT from earlier the same day. FINDINGS: CTA NECK FINDINGS Aortic arch: Visualized aortic arch normal in caliber with normal branch pattern. Moderate atheromatous change about the arch and origin of the great vessels without hemodynamically significant stenosis. Right carotid system: Scattered mixed plaque about the right CCA without high-grade stenosis. Extensive bulky calcified plaque about the right carotid bulb/proximal right ICA with associated stenosis of up to 70% by NASCET criteria. Right ICA patent distally to the skull base without stenosis, dissection or occlusion. Left carotid system: Left CCA patent from its origin to the bifurcation without stenosis. Bulky calcified plaque seen about the left carotid bulb/proximal left ICA with associated stenosis of up to 70% by NASCET criteria. Left ICA patent distally to the skull base without stenosis, dissection or occlusion. Vertebral arteries: Both vertebral arteries arise from the subclavian arteries. No proximal subclavian artery stenosis. Atheromatous plaque at the origin of the left vertebral artery with associated severe ostial stenosis. Plaque at the origin of the right vertebral artery with no more than mild narrowing. Additional multifocal scattered calcified plaque within the V2 segments bilaterally with associated mild to moderate multifocal stenoses. Vertebral arteries remain patent to the skull base without dissection or vascular occlusion. Skeleton: No visible acute osseous finding. No discrete or worrisome osseous lesions. Bulky anterior endplate osteophytic spurring noted at C4-5 through C6-7. Patient is edentulous. Other neck: No other acute soft tissue abnormality within the neck. No mass or  adenopathy. Upper chest: Visualized upper chest demonstrates no acute finding. Left-sided pacemaker/AICD noted. 5 mm nodule noted at the superior segment of the right lower lobe, indeterminate. Review of the MIP images confirms the above findings CTA HEAD FINDINGS Anterior circulation: Petrous segments patent bilaterally. Extensive atheromatous plaque throughout the carotid siphons with associated moderate diffuse narrowing. A1 segments patent bilaterally. Normal anterior communicating artery complex. Anterior cerebral arteries patent to their distal aspects without stenosis. No M1 stenosis or occlusion. Normal MCA bifurcations. No proximal MCA branch occlusion. Distal MCA branches well perfused and symmetric. Posterior circulation: Both V4 segments patent to the vertebrobasilar junction without stenosis. Both PICA origins patent and normal. Basilar patent to its distal aspect without stenosis. Superior cerebral arteries patent bilaterally. Both PCAs primarily supplied via the basilar. Short-segment mild to moderate proximal left P2 stenosis noted. PCAs otherwise patent to their distal aspects without hemodynamically significant stenosis. Venous sinuses: Grossly patent allowing for timing the contrast bolus. Anatomic variants: None significant.  No intracranial aneurysm. Review of the MIP images confirms the above findings IMPRESSION: 1. Negative CTA for emergent large vessel occlusion. 2. 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 3. Extensive atheromatous change throughout the carotid siphons with associated moderate diffuse narrowing. 4. 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 Additional scattered mild to moderate bilateral V2 stenoses. 5. 5 mm right lower lobe nodule, indeterminate. No follow-up needed if patient is low-risk. Non-contrast chest CT can be considered in 12 months if patient is  high-risk. This  recommendation follows the consensus statement: Guidelines for Management of Incidental Pulmonary Nodules Detected on CT Images: From the Fleischner Society 2017; Radiology 2017; 284:228-243. These results were communicated to Dr. Quinn Axe at 11:35 [place] 4/12/2022by text page via the [facility] messaging system. Electronically Signed   By: Jeannine Boga M.D.   On: 02/09/2021 23:52    Scheduled Meds: .  stroke: mapping our early stages of recovery book   Does not apply Once  . aspirin  81 mg Oral Daily  . carbidopa-levodopa  2 tablet Oral TID  . insulin aspart  0-6 Units Subcutaneous TID WC  . memantine  10 mg Oral BID  . pravastatin  20 mg Oral Daily  . rivastigmine  4.6 mg Transdermal Daily   Continuous Infusions: PRN Meds:.acetaminophen **OR** acetaminophen  CARDIAC STUDIES:  EKG 02/10/2021: Sinus rhythm 71 bpm Ventricular paced rhythm, with few native conduction beats  MRI brain 02/10/2021: 1. No acute intracranial abnormality. 2. Mild chronic small vessel ischemic disease and moderate cerebral atrophy.  CTA head stroke 02/09/2021: 1. Negative CTA for emergent large vessel occlusion. 2. 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 3. Extensive atheromatous change throughout the carotid siphons with associated moderate diffuse narrowing. 4. 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 Additional scattered mild to moderate bilateral V2 stenoses. 5. 5 mm right lower lobe nodule, indeterminate. No follow-up needed if patient is low-risk. Non-contrast chest CT can be considered in 12 months if patient is high-risk. This recommendation follows the consensus statement: Guidelines for Management of Incidental Pulmonary Nodules Detected on CT Images: From the Fleischner Society 2017; Radiology 2017; 284:228-243.  Echocardiogram 04/28/2020:  1. Left  ventricular ejection fraction, by estimation, is 50 to 55%. The  left ventricle has low normal function. The left ventricle has no regional  wall motion abnormalities. Left ventricular diastolic parameters are  consistent with Grade I diastolic  dysfunction (impaired relaxation).  2. Right ventricular systolic function is normal. The right ventricular  size is normal. There is moderately elevated pulmonary artery systolic  pressure.  3. The mitral valve is normal in structure. Mild mitral valve  regurgitation.  4. The aortic valve is tricuspid. Aortic valve regurgitation is mild.  Mild to moderate aortic valve sclerosis/calcification is present, without  any evidence of aortic stenosis.    Assessment & Recommendations:   80 y.o. 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  Paroxysmal atrial fibrillation: [id number] score 4, annual stroke risk 5% No high ventricular rate episodes. Please see attached media for A. fib episodes detected on pacemaker. Discussed risks and benefits of anticoagulation with patient's wife over the phone. Recommend Eliquis 5 mg twice daily.  His rate controlled A. fib does not explain his episode of syncope.  No other arrhythmogenic etiology for syncope identified on pacemaker interrogation.  He does have history of severe orthostatic hypotension, possibly related to his baseline Parkinson's disease.  It is possible that his syncope was one such episode.  Will arrange outpatient follow-up with Dr. Elwyn Reach, MD Pager: [phone] Office: [phone]

## 2021-02-10 NOTE — H&P (Signed)
History and Physical    PLEASE NOTE THAT DRAGON DICTATION SOFTWARE WAS USED IN THE CONSTRUCTION OF THIS NOTE.   Dylan [name]. IWP:[id number] DOB: [birth year] DOA: 02/09/2021  PCP: [name], MD Patient coming from: home   I have personally briefly reviewed patient's old medical records in [place]  Chief Complaint: left sided weakness   HPI: Dylan [name]. is a 80 y.o. male with medical history significant for dementia with parkinsonian features, complete heart block status post placement of [facility] medical surgery pacemaker in September 2018, hypertension, B12 anemia, hyperlipidemia, type 2 diabetes mellitus, who is admitted to [facility] on 02/09/2021 for further evaluation of potential acute ischemic stroke after presenting from home to [facility] ED for evaluation of left-sided weakness.   In the context of the patient's underlying dementia, the following history is provided via my discussions with the emergency department physician and via chart review.  Around 2200 on the evening of 02/09/2021, the patient was found to be sitting on the toilet and was noted to be unable to verbally respond to questions posed to him at that time, prompting EMS to be called.  Upon arrival, EMS found the patient to demonstrate left hemiparesis.  Aphasia subsequently improved, the patient was noted to be confused relative to his baseline, which is reportedly new as of this evening.  Episode did not appear to be associated with any tongue biting or loss of bowel/bladder function.  No facial droop was noted.  Per chart review, no documentation of stroke.  In terms of modifiable acute ischemic CVA risk factors, it appears that the patient has a history of hypertension for which he is prescribed Coreg as well as a history of hyperlipidemia for which he is on pravastatin 20 mg p.o. daily.  Additionally, he has a documented history of type 2 diabetes mellitus for which he is on Metformin and q.  weekly Trulicity injections.  Of note, he underwent hemoglobin A1c checked just a few days ago on 02/03/2021, at which time A1c was noted to be 6.6%.  He has a documented history of being a former smoker, having quit smoking completely approximately 35 years ago after less than 5-pack-year prior history of smoking.  No documented history of atrial fibrillation or struct of sleep apnea.  He is on no antiplatelet or anticoagulant medications as an outpatient, including no aspirin.     ED Course:  Patient noted to have arrived at [facility] emergency department at 2301 on 02/09/2021 as a code stroke.  Vital signs in the ED were notable for the following: Temperature max 98.2, heart rate 46-71; blood pressure 118/77 -147/71; respiratory rate 13-19; oxygen saturation 98 100% on room air.  Labs were notable for the following: CMP was notable for the following: Sodium 141, creatinine 1.20 relative to most recent prior value of 1.01 on 02/03/2021, glucose 152.  Ionized calcium 1.21.  CBC notable for white blood cell count of 5400.  INR 1.1.  Direct LDL has been ordered, with result currently pending.  Serum ethanol found to be less than 10.  Urinalysis was ordered, with result currently pending.  Nasopharyngeal COVID-19/influenza PCR was checked in the ED today, with result currently pending.  EKG by way of comparison to most recent prior EKG on 01/07/2021 showed normal sinus rhythm with PACs and heart rate of 71, intraventricular conduction delay, nonspecific T wave inversion in aVL, which is unchanged relative to most recent prior EKG, and no evidence of ST changes,  including no evidence of ST elevation.  Noncontrast CT that showed no evidence of acute intracranial process.  CTA head and neck showed no evidence of large vessel occlusion.  the patient's case and imaging were discussed with the on-call neurologist, Dr Quinn Axe, who will formally consult. Dr Quinn Axe recommends admission to the Hospitalist service for  further evaluation and management of potential acute ischemic CVA, including additional imaging in the form of MRI brain and echo with bubble study. Additionally, Dr Quinn Axe recommends further assessment of potential modifiable acute ischemic CVA risk factors, including checking lipid panel, and telemetry monitoring for paroxsymal atrial fibrillation.   While in the ED, the following were administered: asa 81 mg PO x 1.      Review of Systems: As per HPI otherwise 10 point review of systems negative.   Past Medical History:  Diagnosis Date  . Anxiety   . Aortic stenosis    mild AS 11/24/16 (peak grad 24, mean grad 11) Dr. [name]  . CHB (complete heart block) ([place]) 07/2017  . 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[place]) 07/11/2017  . Diabetes mellitus without complication ([place])   . Encounter for care of pacemaker 07/11/2020  . Gait abnormality   . Gout   . Hyperlipemia   . Hypertension   . 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 Memory loss   . Pacemaker Dual chamber [place] MRI  model [id number] 07/11/2020 06/08/2020  . Peripheral arterial disease ([place])   . 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 Second degree AV block    Wenckebach; no indication for pacemaker as of 12/01/16 (Dr. [name])  . Vitamin B12 deficiency anemia 03/01/2018  . Vitamin D deficiency disease     Past Surgical History:  Procedure Laterality Date  . CARDIOVASCULAR STRESS TEST     11/21/16 Low risk study, EF 52% [facility])  . EYE SURGERY    . KYPHOPLASTY N/A 02/15/2017   Procedure: LUMBAR FOUR KYPHOPLASTY;  Surgeon: Phylliss Bob, MD;  Location: [place];  Service: Orthopedics;  Laterality: N/A;  . lipoma surgery     neck - 30 years ago  . PACEMAKER IMPLANT N/A 07/11/2017   Procedure: Pacemaker Implant;  Surgeon: Constance Haw, MD;  Location: [place] CV LAB;  Service: Cardiovascular;  Laterality: N/A;  . TRANSTHORACIC  ECHOCARDIOGRAM     11/24/16 [facility] CV): EF 55-60%, mild AS, mild-mod MR, mod TR, moderate pulm HTN, PAP 49 mmHg    Social History:  reports that he has quit smoking. His smoking use included cigarettes. He has a 3.50 pack-year smoking history. He has never used smokeless tobacco. He reports that he does not drink alcohol and does not use drugs.   No Known Allergies  Family History  Problem Relation Age of Onset  . Diabetes Mother   . Breast cancer Mother   . Arthritis Mother   . Hypertension Father   . Diabetes Sister   . Diabetes Brother   . Diabetes Maternal Grandmother   . Diabetes Brother   . Diabetes Brother   . Diabetes Brother   . Diabetes Sister   . Diabetes Sister   . Diabetes Sister      Prior to Admission medications   Medication Sig Start Date End Date Taking? Authorizing Provider  Ascorbic Acid (VITAMIN C) 1000 MG tablet Take 1,000 mg by mouth every morning.     [provider]  carbidopa-levodopa (SINEMET IR) 25-100 MG  tablet Take 2 tablets by mouth 3 (three) times daily. 12/24/20   Marcial Pacas, MD  carvedilol (COREG) 12.5 MG tablet TAKE 1 TABLET BY MOUTH 2 TIMES DAILY. Patient taking differently: Take 12.5 mg by mouth 2 (two) times daily with a meal. 07/07/20   [name], MD  Cholecalciferol (VITAMIN D3) 5000 units CAPS Take 5,000 Units by mouth daily with supper.     [provider]  desonide (DESOWEN) 0.05 % lotion Apply topically 2 times per day prn 06/17/20   [name], MD  diclofenac Sodium (VOLTAREN) 1 % GEL Apply 2 g topically 3 (three) times daily. Patient taking differently: Apply 2 g topically as needed. 10/10/19   Rodriguez-Southworth, Sunday Spillers, PA-C  glucose blood (ONETOUCH VERIO) test strip Use as instructed to check blood sugars daily dx: e11.22 01/06/20   [name], MD  Lancet Devices (ONE TOUCH DELICA LANCING DEV) MISC Please fill ONE TOUCH DELICA LANCING DEVICE.  Use to test blood sugar twice daily as directed.  E11.65 Patient taking differently: Please fill ONE TOUCH DELICA LANCING DEVICE.  Use to test blood sugar twice daily as directed. E11.65 11/15/19   [name], MD  Lancets MISC Please fill basic/generic push button lancets.  Patient to test blood sugar twice daily. DX: E11.65 12/06/19   [name], MD  linaclotide Rolan Lipa) 72 MCG capsule Take 1 capsule (72 mcg total) by mouth daily before breakfast. Patient not taking: Reported on 02/03/2021 12/19/20   [name], MD  Melatonin 10 MG TABS Take 20 mg by mouth at bedtime.    [provider]  memantine (NAMENDA) 10 MG tablet TAKE 1 TABLET BY MOUTH TWICE A DAY Patient taking differently: Take 10 mg by mouth 2 (two) times daily. 07/07/20   [name], MD  metFORMIN (GLUCOPHAGE) 1000 MG tablet Take 1 tablet by mouth daily 12/24/20   [name], MD  pravastatin (PRAVACHOL) 20 MG tablet TAKE 1 TABLET BY ORAL ROUTE EVERY DAY 12/01/20   [name], MD  QUEtiapine (SEROQUEL) 25 MG tablet TAKE 1 TABLET (25 MG TOTAL) BY MOUTH AT BEDTIME AS NEEDED. Patient not taking: Reported on 02/03/2021 08/21/20   Marcial Pacas, MD  rivastigmine (EXELON) 4.6 mg/24hr Place 1 patch (4.6 mg total) onto the skin daily. 10/26/20   Suzzanne Cloud, NP  senna-docusate (SENOKOT-S) 8.6-50 MG tablet Take 1 tablet by mouth as needed for mild constipation.    [provider]  TRULICITY 1.5 TJ/0.3ES SOPN INJECT 1.5 MG INTO THE SKIN ONCE A WEEK. Patient taking differently: Inject 1.5 mg into the skin every Monday. 09/07/20   [name], MD     Objective    Physical Exam: Vitals:   02/10/21 0200 02/10/21 0215 02/10/21 0245 02/10/21 0315  BP: (!) 127/58 106/70 (!) 141/43 (!) 146/57  Pulse: 72 (!) 35 (!) 53 (!) 46  Resp: 17 19 17 13   Temp:      TempSrc:      SpO2: 100% 100% 99% 100%  Weight:        General: appears to be stated age; alert; knows he is in hospital, but doesn't know which hospital or why he is currently here; unsure as to the  current year.  Recognizes the current [place] president on television, but does not recall his name at this time Skin: warm, dry, no rash Head:  AT/[place] Mouth:  Oral mucosa membranes appear moist, normal dentition Neck: supple; trachea midline Heart:  RRR; did not appreciate any M/R/G Lungs: CTAB, did not appreciate  any wheezes, rales, or rhonchi Abdomen: + BS; soft, ND, NT Vascular: 2+ pedal pulses b/l; 2+ radial pulses b/l Extremities: no peripheral edema, no muscle wasting Neuro: 5/5 strength of the proximal and distal flexors and extensors of the upper and lower extremities bilaterally; slightly diminished left hand grip strength relative to right; sensation intact in upper and lower extremities b/l; cranial nerves II through XII grossly intact; no pronator drift; no evidence of facial droop; Normal muscle tone.     Labs on Admission: I have personally reviewed following labs and imaging studies  CBC: Recent Labs  Lab 02/03/21 1516 02/09/21 2305 02/09/21 2313  WBC 3.3* 5.4  --   NEUTROABS  --  2.7  --   HGB 13.6 12.9* 13.3  HCT 39.8 39.7 39.0  MCV 85 88.4  --   PLT 172 160  --    Basic Metabolic Panel: Recent Labs  Lab 02/03/21 1516 02/09/21 2305 02/09/21 2313  NA 144 139 141  K 4.2 4.0 4.0  CL 102 104 103  CO2 24 28  --   GLUCOSE 157* 163* 152*  BUN 14 21 24*  CREATININE 1.01 1.28* 1.20  CALCIUM 10.0 9.4  --    GFR: Estimated Creatinine Clearance: 45 mL/min (by C-G formula based on SCr of 1.2 mg/dL). Liver Function Tests: Recent Labs  Lab 02/03/21 1516 02/09/21 2305  AST 12 17  ALT 7 <5  ALKPHOS 119 83  BILITOT 0.3 0.8  PROT 7.3 6.7  ALBUMIN 4.2 3.5   No results for input(s): LIPASE, AMYLASE in the last 168 hours. No results for input(s): AMMONIA in the last 168 hours. Coagulation Profile: Recent Labs  Lab 02/09/21 2305  INR 1.1   Cardiac Enzymes: No results for input(s): CKTOTAL, CKMB, CKMBINDEX, TROPONINI in the last 168 hours. BNP (last 3  results) No results for input(s): PROBNP in the last 8760 hours. HbA1C: No results for input(s): HGBA1C in the last 72 hours. CBG: Recent Labs  Lab 02/09/21 2303  GLUCAP 148*   Lipid Profile: No results for input(s): CHOL, HDL, LDLCALC, TRIG, CHOLHDL, LDLDIRECT in the last 72 hours. Thyroid Function Tests: No results for input(s): TSH, T4TOTAL, FREET4, T3FREE, THYROIDAB in the last 72 hours. Anemia Panel: No results for input(s): VITAMINB12, FOLATE, FERRITIN, TIBC, IRON, RETICCTPCT in the last 72 hours. Urine analysis:    Component Value Date/Time   COLORURINE YELLOW 10/15/2020 1530   APPEARANCEUR CLEAR 10/15/2020 1530   LABSPEC 1.009 10/15/2020 1530   PHURINE 6.0 10/15/2020 1530   GLUCOSEU NEGATIVE 10/15/2020 1530   HGBUR NEGATIVE 10/15/2020 1530   BILIRUBINUR negative 11/03/2020 [place] 10/15/2020 1530   PROTEINUR Positive (A) 11/03/2020 1747   PROTEINUR NEGATIVE 10/15/2020 1530   UROBILINOGEN 0.2 11/03/2020 1747   NITRITE negative 11/03/2020 1747   NITRITE NEGATIVE 10/15/2020 1530   LEUKOCYTESUR Negative 11/03/2020 1747   LEUKOCYTESUR NEGATIVE 10/15/2020 1530    Radiological Exams on Admission: CT HEAD CODE STROKE WO CONTRAST  Result Date: 02/09/2021 CLINICAL DATA:  Code stroke. Initial evaluation for neuro deficit, stroke suspected. EXAM: CT HEAD WITHOUT CONTRAST TECHNIQUE: Contiguous axial images were obtained from the base of the skull through the vertex without intravenous contrast. COMPARISON:  Prior CT from 04/27/2020 FINDINGS: Brain: Moderately advanced age-related cerebral atrophy with chronic small vessel ischemic disease. No acute intracranial hemorrhage. Linear hyperdensity at the left basal ganglia most consistent with calcification/mineralization, stable from previous. No acute large vessel territory infarct. No mass lesion, midline shift or mass  effect. Mild ventricular prominence related to global parenchymal volume loss of hydrocephalus. No  extra-axial fluid collection. Vascular: No hyperdense vessel. Scattered vascular calcifications noted within the carotid siphons. Skull: Scalp soft tissues and calvarium within normal limits. Sinuses/Orbits: Globes and orbital soft tissues within normal limits. Paranasal sinuses are largely clear. Trace right mastoid effusion noted, of doubtful significance. Other: None. ASPECTS [facility] Stroke Program Early CT Score) - Ganglionic level infarction (caudate, lentiform nuclei, internal capsule, insula, M1-M3 cortex): 7 - Supraganglionic infarction (M4-M6 cortex): 3 Total score (0-10 with 10 being normal): 10 IMPRESSION: 1. No acute intracranial infarct or other abnormality. 2. ASPECTS is 10. 3. Moderately advanced cerebral atrophy with chronic small vessel ischemic disease. These results were communicated to Dr. Quinn Axe at 11:20 [place] 4/12/2022by text page via the [facility] messaging system. Electronically Signed   By: Jeannine Boga M.D.   On: 02/09/2021 23:24   CT ANGIO HEAD CODE STROKE  Result Date: 02/09/2021 CLINICAL DATA:  Initial evaluation for acute stroke, right gaze, left arm weakness, slurred speech. EXAM: CT ANGIOGRAPHY HEAD AND NECK TECHNIQUE: Multidetector CT imaging of the head and neck was performed using the standard protocol during bolus administration of intravenous contrast. Multiplanar CT image reconstructions and MIPs were obtained to evaluate the vascular anatomy. Carotid stenosis measurements (when applicable) are obtained utilizing NASCET criteria, using the distal internal carotid diameter as the denominator. CONTRAST:  19mL OMNIPAQUE IOHEXOL 350 MG/ML SOLN COMPARISON:  Prior noncontrast head CT from earlier the same day. FINDINGS: CTA NECK FINDINGS Aortic arch: Visualized aortic arch normal in caliber with normal branch pattern. Moderate atheromatous change about the arch and origin of the great vessels without hemodynamically significant stenosis. Right carotid system: Scattered mixed  plaque about the right CCA without high-grade stenosis. Extensive bulky calcified plaque about the right carotid bulb/proximal right ICA with associated stenosis of up to 70% by NASCET criteria. Right ICA patent distally to the skull base without stenosis, dissection or occlusion. Left carotid system: Left CCA patent from its origin to the bifurcation without stenosis. Bulky calcified plaque seen about the left carotid bulb/proximal left ICA with associated stenosis of up to 70% by NASCET criteria. Left ICA patent distally to the skull base without stenosis, dissection or occlusion. Vertebral arteries: Both vertebral arteries arise from the subclavian arteries. No proximal subclavian artery stenosis. Atheromatous plaque at the origin of the left vertebral artery with associated severe ostial stenosis. Plaque at the origin of the right vertebral artery with no more than mild narrowing. Additional multifocal scattered calcified plaque within the V2 segments bilaterally with associated mild to moderate multifocal stenoses. Vertebral arteries remain patent to the skull base without dissection or vascular occlusion. Skeleton: No visible acute osseous finding. No discrete or worrisome osseous lesions. Bulky anterior endplate osteophytic spurring noted at C4-5 through C6-7. Patient is edentulous. Other neck: No other acute soft tissue abnormality within the neck. No mass or adenopathy. Upper chest: Visualized upper chest demonstrates no acute finding. Left-sided pacemaker/AICD noted. 5 mm nodule noted at the superior segment of the right lower lobe, indeterminate. Review of the MIP images confirms the above findings CTA HEAD FINDINGS Anterior circulation: Petrous segments patent bilaterally. Extensive atheromatous plaque throughout the carotid siphons with associated moderate diffuse narrowing. A1 segments patent bilaterally. Normal anterior communicating artery complex. Anterior cerebral arteries patent to their distal  aspects without stenosis. No M1 stenosis or occlusion. Normal MCA bifurcations. No proximal MCA branch occlusion. Distal MCA branches well perfused and symmetric. Posterior circulation: Both V4 segments  patent to the vertebrobasilar junction without stenosis. Both PICA origins patent and normal. Basilar patent to its distal aspect without stenosis. Superior cerebral arteries patent bilaterally. Both PCAs primarily supplied via the basilar. Short-segment mild to moderate proximal left P2 stenosis noted. PCAs otherwise patent to their distal aspects without hemodynamically significant stenosis. Venous sinuses: Grossly patent allowing for timing the contrast bolus. Anatomic variants: None significant.  No intracranial aneurysm. Review of the MIP images confirms the above findings IMPRESSION: 1. Negative CTA for emergent large vessel occlusion. 2. 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 3. Extensive atheromatous change throughout the carotid siphons with associated moderate diffuse narrowing. 4. 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 Additional scattered mild to moderate bilateral V2 stenoses. 5. 5 mm right lower lobe nodule, indeterminate. No follow-up needed if patient is low-risk. Non-contrast chest CT can be considered in 12 months if patient is high-risk. This recommendation follows the consensus statement: Guidelines for Management of Incidental Pulmonary Nodules Detected on CT Images: From the Fleischner Society 2017; Radiology 2017; 284:228-243. These results were communicated to Dr. Quinn Axe at 11:35 [place] 4/12/2022by text page via the [facility] messaging system. Electronically Signed   By: Jeannine Boga M.D.   On: 02/09/2021 23:52   CT ANGIO NECK CODE STROKE  Result Date: 02/09/2021 CLINICAL DATA:  Initial evaluation for acute stroke, right gaze, left arm weakness, slurred speech.  EXAM: CT ANGIOGRAPHY HEAD AND NECK TECHNIQUE: Multidetector CT imaging of the head and neck was performed using the standard protocol during bolus administration of intravenous contrast. Multiplanar CT image reconstructions and MIPs were obtained to evaluate the vascular anatomy. Carotid stenosis measurements (when applicable) are obtained utilizing NASCET criteria, using the distal internal carotid diameter as the denominator. CONTRAST:  17mL OMNIPAQUE IOHEXOL 350 MG/ML SOLN COMPARISON:  Prior noncontrast head CT from earlier the same day. FINDINGS: CTA NECK FINDINGS Aortic arch: Visualized aortic arch normal in caliber with normal branch pattern. Moderate atheromatous change about the arch and origin of the great vessels without hemodynamically significant stenosis. Right carotid system: Scattered mixed plaque about the right CCA without high-grade stenosis. Extensive bulky calcified plaque about the right carotid bulb/proximal right ICA with associated stenosis of up to 70% by NASCET criteria. Right ICA patent distally to the skull base without stenosis, dissection or occlusion. Left carotid system: Left CCA patent from its origin to the bifurcation without stenosis. Bulky calcified plaque seen about the left carotid bulb/proximal left ICA with associated stenosis of up to 70% by NASCET criteria. Left ICA patent distally to the skull base without stenosis, dissection or occlusion. Vertebral arteries: Both vertebral arteries arise from the subclavian arteries. No proximal subclavian artery stenosis. Atheromatous plaque at the origin of the left vertebral artery with associated severe ostial stenosis. Plaque at the origin of the right vertebral artery with no more than mild narrowing. Additional multifocal scattered calcified plaque within the V2 segments bilaterally with associated mild to moderate multifocal stenoses. Vertebral arteries remain patent to the skull base without dissection or vascular occlusion.  Skeleton: No visible acute osseous finding. No discrete or worrisome osseous lesions. Bulky anterior endplate osteophytic spurring noted at C4-5 through C6-7. Patient is edentulous. Other neck: No other acute soft tissue abnormality within the neck. No mass or adenopathy. Upper chest: Visualized upper chest demonstrates no acute finding. Left-sided pacemaker/AICD noted. 5 mm nodule noted at the superior segment of the right lower lobe,  indeterminate. Review of the MIP images confirms the above findings CTA HEAD FINDINGS Anterior circulation: Petrous segments patent bilaterally. Extensive atheromatous plaque throughout the carotid siphons with associated moderate diffuse narrowing. A1 segments patent bilaterally. Normal anterior communicating artery complex. Anterior cerebral arteries patent to their distal aspects without stenosis. No M1 stenosis or occlusion. Normal MCA bifurcations. No proximal MCA branch occlusion. Distal MCA branches well perfused and symmetric. Posterior circulation: Both V4 segments patent to the vertebrobasilar junction without stenosis. Both PICA origins patent and normal. Basilar patent to its distal aspect without stenosis. Superior cerebral arteries patent bilaterally. Both PCAs primarily supplied via the basilar. Short-segment mild to moderate proximal left P2 stenosis noted. PCAs otherwise patent to their distal aspects without hemodynamically significant stenosis. Venous sinuses: Grossly patent allowing for timing the contrast bolus. Anatomic variants: None significant.  No intracranial aneurysm. Review of the MIP images confirms the above findings IMPRESSION: 1. Negative CTA for emergent large vessel occlusion. 2. 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 3. Extensive atheromatous change throughout the carotid siphons with associated moderate diffuse narrowing. 4. Severe ostial stenosis at  the origin of the left vertebral artery. Additional scattered mild to moderate bilateral V2 stenoses. 5. 5 mm right lower lobe nodule, indeterminate. No follow-up needed if patient is low-risk. Non-contrast chest CT can be considered in 12 months if patient is high-risk. This recommendation follows the consensus statement: Guidelines for Management of Incidental Pulmonary Nodules Detected on CT Images: From the Fleischner Society 2017; Radiology 2017; 284:228-243. These results were communicated to Dr. Quinn Axe at 11:35 [place] 4/12/2022by text page via the [facility] messaging system. Electronically Signed   By: Jeannine Boga M.D.   On: 02/09/2021 23:52     EKG: Independently reviewed, with result as described above.    Assessment/Plan   Dylan [name]. is a 80 y.o. 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    Principal Problem:   TIA (transient ischemic attack) Active Problems:   Essential hypertension   Type 2 diabetes mellitus with stage 3a chronic kidney disease, without long-term current use of insulin (HCC)   Hyperlipemia   Acute encephalopathy    #) Acute ischemic CVA: Presentation concerning for acute ischemic CVA in the setting of reported acute onset of aphasia and left hemiparesis around 2200 on 02/09/21, with CT head showing no evidence of acute intracranial process, including no evidence of acute intracranial hemorrhage.  Additionally, CTA head and neck showed no evidence of dissection, aneurysm, thrombus, large vessel occlusion.  It appears that his aphasia has resolved.  It also appears that his  initial left hemiparesis has improved, although left grip strength deficit was noted on exam.   The on-call neurologist, Dr Quinn Axe, has been formally consulted and evaluated the patient in the ED, and recommends admission to the hospital service for further evaluation of potential acute ischemic CVA, including recommend recommending further assessment with MRI of the brain.  She also recommends further evaluation potential modifiable acute ischemic CVA risk factors, including checking LDL level.  Of note, hemoglobin A1c was just checked as an outpatient on 02/03/2021, and found to  be 6.6%.  Therefore, there does not appear to be significant utility in repeating a hemoglobin A1c level at this time.  She also recommends TTE with bubble study to evaluate for intracardiac thrombus, septal aneurysm, or septal wall defect, and also to monitor on telemetry to evaluate for the presence of previously undiagnosed atrial fibrillation.  Of note, the patient possesses multiple modifiable CV risk factors, including history of type 2 diabetes mellitus, hypertension, hyperlipidemia, and a distant smoking history.  No known history of underlying obstructive sleep apnea or paroxysmal atrial fibrillation.  EKG in the ED today showed sinus rhythm.  Additionally, Dr. Quinn Axe recommends supportive measures in the form of consultation of PT, OT, and ST.   Dr Quinn Axe confirms that patient is not a candidate for TPA given significant interval improvement in potential presenting acute focal neurologic deficits, with minimal residual left grip strength deficit noted at this time.  Not on any blood thinning agents as an outpatient, including no aspirin.  Current outpatient antilipid regimen consists of pravastatin 20 mg p.o. nightly, which Dr.Stack Recommends continuing, pending the results of today's LDL evaluation.  She also recommends initiation of daily baby aspirin, and allowance for permissive hypertension over the next 48hours following  the onset of acute focal neurologic deficits with associated parameters as further quantified below.  ASA 81 mg p.o. x1 administered in the ED today.    Plan: Nursing bedside swallow evaluation x 1 now, and will not initiate oral medications or diet until the patient has passed this. Head of the bed at 30 degrees. Neuro checks per protocol. VS per protocol. Will allow for permissive hypertension for 48hours following onset of acute focal neurologic deficits, during which will hold home antihypertensive medications, with prn IV labetalol ordered for systolic blood pressure greater than 481 mmHg or diastolic blood pressure greater than 120 mmHg until 2200 on 02/11/21. Monitor on telemetry, including monitoring for atrial fibrillation as modifiable risk factor for acute ischemic CVA.  Should overnight telemetry not demonstrate evidence of atrial fibrillation, can consider 30-day cardiac event monitor at the time of discharge to further evaluate for this arrhythmia. MRI brain.  TTE with bubble study, as above.  Follow-up result of LDL level.  PT, OT, ST consults have been ordered.  Continue home pravastatin, as above.      #) Acute encephalopathy: Acute onset of confusion associated with this evening's episode involving aphasia and left hemiparesis, with subsequent improvement patient's mental status, back to apparent baseline.  Suspect that this was neurologic in nature in the context of potential presenting acute ischemic CVA, as above.  No evidence of underlying metabolic contribution at this time, including no overt electrolyte abnormalities, including ionized calcium within normal limits as well as MMA found to be within normal limits when checked 1 week ago as an outpatient.  No evidence of underlying infectious process at this time, including COVID-19 teen PCR which is found to be negative.  Of note, urinalysis has been ordered, with result currently pending.  Plan: Management of potential presenting  acute ischemic CVA, as above.  Check TSH.  Follow-up result of urinalysis.  Repeat BMP and CBC in the morning.  Maintain n.p.o. status until the patient has passed nursing bedside swallow screen, as ordered above.     #) Essential hypertension: On Coreg as an outpatient.  Per neurology consult, will observe 48 hours of permissive hypertension in the setting of potential presenting acute ischemic CVA, as further described above.   Plan: In observance of  permissive hypertension, will hold home Coreg for now.  Close monitoring of ensuing blood pressure via routine vital signs.  As needed IV labetalol, with parameters, as described above.     #) Hyperlipidemia: On pravastatin 20 mg p.o. daily as an outpatient.  Per neurology consult, mentions continuation of home dose of pravastatin, with potential dose adjustment depending upon outcome of LDL lab result has been ordered this evening.  Plan: Continue home dose of pravastatin for now.  Monitor for result of LDL level.     #) Type 2 diabetes mellitus: Most recent hemoglobin A1c was checked 1 week ago on 02/03/2021 and found to be 6.6% at that time.  On weekly Trulicity injections as well as Metformin as an outpatient.  Presenting blood sugar per presenting CMP noted to be 152.  There is not appear to be utility in repeating hemoglobin A1c level at this time, in spite of suspected presenting acute ischemic CVA given that this value was just checked 1 week ago.   Plan: We will hold outpatient Metformin and Trulicity during this hospitalization.  Accu-Cheks q. before meals and at bedtime with low-dose sliding scale insulin.     DVT prophylaxis: scd's  Code Status: Full code Family Communication: none Disposition Plan: Per Rounding Team Consults called: case discussed with on -call neurologist, Dr. Quinn Axe, as further described above Admission status: Observation; med telemetry   Of note, this patient was added by me to the following Admit  List/Treatment Team:  mcadmits.     PLEASE NOTE THAT DRAGON DICTATION SOFTWARE WAS USED IN THE CONSTRUCTION OF THIS NOTE.   Rhetta Mura DO [facility] Pager [phone] From [place]   02/10/2021, 3:59 AM

## 2021-02-10 NOTE — ED Notes (Signed)
Pt returned from MRI °

## 2021-02-10 NOTE — Progress Notes (Addendum)
STROKE TEAM PROGRESS NOTE   INTERVAL HISTORY Patient os a 80 yo w/ hx of dementia w/ parkinsonian features. Hx is per EMR. Patient also has a hx of complete heart block status post placement of [facility] medical surgery pacemaker in September 2018, hypertension, B12 anemia, hyperlipidemia, type 2 diabetes mellitus, who is admitted to [facility] on 02/09/2021 for further evaluation of potential acute ischemic stroke after presenting from home to [facility] ED for evaluation of left-sided weakness.    Vitals:   02/10/21 0530 02/10/21 0600 02/10/21 0800 02/10/21 0809  BP: (!) 162/62 (!) 169/64 (!) 153/102   Pulse: 68 67 (!) 105   Resp: 16  11   Temp:    98 F (36.7 C)  TempSrc:    Oral  SpO2: 100% 98% 98%   Weight:       CBC:  Recent Labs  Lab 02/09/21 2305 02/09/21 2313 02/10/21 0440  WBC 5.4  --  5.2  NEUTROABS 2.7  --   --   HGB 12.9* 13.3 12.8*  HCT 39.7 39.0 39.3  MCV 88.4  --  87.9  PLT 160  --  756   Basic Metabolic Panel:  Recent Labs  Lab 02/09/21 2305 02/09/21 2313 02/10/21 0440  NA 139 141 140  K 4.0 4.0 4.0  CL 104 103 107  CO2 28  --  26  GLUCOSE 163* 152* 144*  BUN 21 24* 22  CREATININE 1.28* 1.20 1.14  CALCIUM 9.4  --  9.6  MG  --   --  1.7   HgbA1c:  Recent Labs  Lab 02/03/21 1516  HGBA1C 6.6*   Urine Drug Screen: No results for input(s): LABOPIA, COCAINSCRNUR, LABBENZ, AMPHETMU, THCU, LABBARB in the last 168 hours.  Alcohol Level  Recent Labs  Lab 02/09/21 2305  ETH <10    IMAGING past 24 hours CT HEAD CODE STROKE WO CONTRAST  Result Date: 02/09/2021 CLINICAL DATA:  Code stroke. Initial evaluation for neuro deficit, stroke suspected. EXAM: CT HEAD WITHOUT CONTRAST TECHNIQUE: Contiguous axial images were obtained from the base of the skull through the vertex without intravenous contrast. COMPARISON:  Prior CT from 04/27/2020 FINDINGS: Brain: Moderately advanced age-related cerebral atrophy with chronic small vessel ischemic disease. No  acute intracranial hemorrhage. Linear hyperdensity at the left basal ganglia most consistent with calcification/mineralization, stable from previous. No acute large vessel territory infarct. No mass lesion, midline shift or mass effect. Mild ventricular prominence related to global parenchymal volume loss of hydrocephalus. No extra-axial fluid collection. Vascular: No hyperdense vessel. Scattered vascular calcifications noted within the carotid siphons. Skull: Scalp soft tissues and calvarium within normal limits. Sinuses/Orbits: Globes and orbital soft tissues within normal limits. Paranasal sinuses are largely clear. Trace right mastoid effusion noted, of doubtful significance. Other: None. ASPECTS [facility] Stroke Program Early CT Score) - Ganglionic level infarction (caudate, lentiform nuclei, internal capsule, insula, M1-M3 cortex): 7 - Supraganglionic infarction (M4-M6 cortex): 3 Total score (0-10 with 10 being normal): 10 IMPRESSION: 1. No acute intracranial infarct or other abnormality. 2. ASPECTS is 10. 3. Moderately advanced cerebral atrophy with chronic small vessel ischemic disease. These results were communicated to Dr. Quinn Axe at 11:20 [place] 4/12/2022by text page via the [facility] messaging system. Electronically Signed   By: Jeannine Boga M.D.   On: 02/09/2021 23:24   CT ANGIO HEAD CODE STROKE  Result Date: 02/09/2021 CLINICAL DATA:  Initial evaluation for acute stroke, right gaze, left arm weakness, slurred speech. EXAM: CT ANGIOGRAPHY HEAD AND NECK  TECHNIQUE: Multidetector CT imaging of the head and neck was performed using the standard protocol during bolus administration of intravenous contrast. Multiplanar CT image reconstructions and MIPs were obtained to evaluate the vascular anatomy. Carotid stenosis measurements (when applicable) are obtained utilizing NASCET criteria, using the distal internal carotid diameter as the denominator. CONTRAST:  53mL OMNIPAQUE IOHEXOL 350 MG/ML SOLN  COMPARISON:  Prior noncontrast head CT from earlier the same day. FINDINGS: CTA NECK FINDINGS Aortic arch: Visualized aortic arch normal in caliber with normal branch pattern. Moderate atheromatous change about the arch and origin of the great vessels without hemodynamically significant stenosis. Right carotid system: Scattered mixed plaque about the right CCA without high-grade stenosis. Extensive bulky calcified plaque about the right carotid bulb/proximal right ICA with associated stenosis of up to 70% by NASCET criteria. Right ICA patent distally to the skull base without stenosis, dissection or occlusion. Left carotid system: Left CCA patent from its origin to the bifurcation without stenosis. Bulky calcified plaque seen about the left carotid bulb/proximal left ICA with associated stenosis of up to 70% by NASCET criteria. Left ICA patent distally to the skull base without stenosis, dissection or occlusion. Vertebral arteries: Both vertebral arteries arise from the subclavian arteries. No proximal subclavian artery stenosis. Atheromatous plaque at the origin of the left vertebral artery with associated severe ostial stenosis. Plaque at the origin of the right vertebral artery with no more than mild narrowing. Additional multifocal scattered calcified plaque within the V2 segments bilaterally with associated mild to moderate multifocal stenoses. Vertebral arteries remain patent to the skull base without dissection or vascular occlusion. Skeleton: No visible acute osseous finding. No discrete or worrisome osseous lesions. Bulky anterior endplate osteophytic spurring noted at C4-5 through C6-7. Patient is edentulous. Other neck: No other acute soft tissue abnormality within the neck. No mass or adenopathy. Upper chest: Visualized upper chest demonstrates no acute finding. Left-sided pacemaker/AICD noted. 5 mm nodule noted at the superior segment of the right lower lobe, indeterminate. Review of the MIP images  confirms the above findings CTA HEAD FINDINGS Anterior circulation: Petrous segments patent bilaterally. Extensive atheromatous plaque throughout the carotid siphons with associated moderate diffuse narrowing. A1 segments patent bilaterally. Normal anterior communicating artery complex. Anterior cerebral arteries patent to their distal aspects without stenosis. No M1 stenosis or occlusion. Normal MCA bifurcations. No proximal MCA branch occlusion. Distal MCA branches well perfused and symmetric. Posterior circulation: Both V4 segments patent to the vertebrobasilar junction without stenosis. Both PICA origins patent and normal. Basilar patent to its distal aspect without stenosis. Superior cerebral arteries patent bilaterally. Both PCAs primarily supplied via the basilar. Short-segment mild to moderate proximal left P2 stenosis noted. PCAs otherwise patent to their distal aspects without hemodynamically significant stenosis. Venous sinuses: Grossly patent allowing for timing the contrast bolus. Anatomic variants: None significant.  No intracranial aneurysm. Review of the MIP images confirms the above findings IMPRESSION: 1. Negative CTA for emergent large vessel occlusion. 2. 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 3. Extensive atheromatous change throughout the carotid siphons with associated moderate diffuse narrowing. 4. 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 Additional scattered mild to moderate bilateral V2 stenoses. 5. 5 mm right lower lobe nodule, indeterminate. No follow-up needed if patient is low-risk. Non-contrast chest CT can be considered in 12 months if patient is high-risk. This recommendation follows the consensus statement: Guidelines for Management of Incidental Pulmonary Nodules Detected  on CT Images: From the Fleischner Society 2017; Radiology 2017; [id number]. These  results were communicated to Dr. Quinn Axe at 11:35 [place] 4/12/2022by text page via the [facility] messaging system. Electronically Signed   By: Jeannine Boga M.D.   On: 02/09/2021 23:52   CT ANGIO NECK CODE STROKE  Result Date: 02/09/2021 CLINICAL DATA:  Initial evaluation for acute stroke, right gaze, left arm weakness, slurred speech. EXAM: CT ANGIOGRAPHY HEAD AND NECK TECHNIQUE: Multidetector CT imaging of the head and neck was performed using the standard protocol during bolus administration of intravenous contrast. Multiplanar CT image reconstructions and MIPs were obtained to evaluate the vascular anatomy. Carotid stenosis measurements (when applicable) are obtained utilizing NASCET criteria, using the distal internal carotid diameter as the denominator. CONTRAST:  92mL OMNIPAQUE IOHEXOL 350 MG/ML SOLN COMPARISON:  Prior noncontrast head CT from earlier the same day. FINDINGS: CTA NECK FINDINGS Aortic arch: Visualized aortic arch normal in caliber with normal branch pattern. Moderate atheromatous change about the arch and origin of the great vessels without hemodynamically significant stenosis. Right carotid system: Scattered mixed plaque about the right CCA without high-grade stenosis. Extensive bulky calcified plaque about the right carotid bulb/proximal right ICA with associated stenosis of up to 70% by NASCET criteria. Right ICA patent distally to the skull base without stenosis, dissection or occlusion. Left carotid system: Left CCA patent from its origin to the bifurcation without stenosis. Bulky calcified plaque seen about the left carotid bulb/proximal left ICA with associated stenosis of up to 70% by NASCET criteria. Left ICA patent distally to the skull base without stenosis, dissection or occlusion. Vertebral arteries: Both vertebral arteries arise from the subclavian arteries. No proximal subclavian artery stenosis. Atheromatous plaque at the origin of the left vertebral artery with associated  severe ostial stenosis. Plaque at the origin of the right vertebral artery with no more than mild narrowing. Additional multifocal scattered calcified plaque within the V2 segments bilaterally with associated mild to moderate multifocal stenoses. Vertebral arteries remain patent to the skull base without dissection or vascular occlusion. Skeleton: No visible acute osseous finding. No discrete or worrisome osseous lesions. Bulky anterior endplate osteophytic spurring noted at C4-5 through C6-7. Patient is edentulous. Other neck: No other acute soft tissue abnormality within the neck. No mass or adenopathy. Upper chest: Visualized upper chest demonstrates no acute finding. Left-sided pacemaker/AICD noted. 5 mm nodule noted at the superior segment of the right lower lobe, indeterminate. Review of the MIP images confirms the above findings CTA HEAD FINDINGS Anterior circulation: Petrous segments patent bilaterally. Extensive atheromatous plaque throughout the carotid siphons with associated moderate diffuse narrowing. A1 segments patent bilaterally. Normal anterior communicating artery complex. Anterior cerebral arteries patent to their distal aspects without stenosis. No M1 stenosis or occlusion. Normal MCA bifurcations. No proximal MCA branch occlusion. Distal MCA branches well perfused and symmetric. Posterior circulation: Both V4 segments patent to the vertebrobasilar junction without stenosis. Both PICA origins patent and normal. Basilar patent to its distal aspect without stenosis. Superior cerebral arteries patent bilaterally. Both PCAs primarily supplied via the basilar. Short-segment mild to moderate proximal left P2 stenosis noted. PCAs otherwise patent to their distal aspects without hemodynamically significant stenosis. Venous sinuses: Grossly patent allowing for timing the contrast bolus. Anatomic variants: None significant.  No intracranial aneurysm. Review of the MIP images confirms the above findings  IMPRESSION: 1. Negative CTA for emergent large vessel occlusion. 2. Bulky calcified plaque about the carotid bifurcations/proximal ICAs, with associated stenoses of up to 70% by NASCET criteria  bilaterally. 3. Extensive atheromatous change throughout the carotid siphons with associated moderate diffuse narrowing. 4. 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 Additional scattered mild to moderate bilateral V2 stenoses. 5. 5 mm right lower lobe nodule, indeterminate. No follow-up needed if patient is low-risk. Non-contrast chest CT can be considered in 12 months if patient is high-risk. This recommendation follows the consensus statement: Guidelines for Management of Incidental Pulmonary Nodules Detected on CT Images: From the Fleischner Society 2017; Radiology 2017; [id number]. These results were communicated to Dr. Quinn Axe at 11:35 [place] 4/12/2022by text page via the [facility] messaging system. Electronically Signed   By: Jeannine Boga M.D.   On: 02/09/2021 23:52    PHYSICAL EXAM Gen: Frail elderly cachectic looking African-American male not in distress.  HEENT: Atraumatic, normocephalic;mucous membranes moist; oropharynx clear, tongue without atrophy or fasciculations. Neck: Supple, trachea midline. Resp: Symmetrical chest rise and fall, no distress or increased work of breathing noted CV: RRR, Extrem: Nml bulk; no cyanosis, clubbing, or edema.  Neuro: MS: oriented to self and hospital. Follows single step commands but has difficulty with more complex commands.  Diminished attention, registration and recall. Speech: mild dysarthria, comprehension is mildly impaired. CN:    I: Deferred   II,III: PERRLA,  100mm    III,IV,VI: EOMI w/o nystagmus, no ptosis   V: Sensation intact from V1 to V3 to LT   VII: Eyelid closure was full.Frown symmetric.   VIII: Hearing intact to voice   IX,X: Voice normal, palate elevates symmetrically    XI: Did not understand command to shrug  shoulder, was unable to assess XII: Tongue protrudes midline, no atrophy or fasciculations   Strength: L grip strength decreased. BUE 3/5 against gravity BLE 3/5 no drift noted. Patient has increased tone of BUE with Gegenhalten sign.  Reflex: patient has 2+ biceps and triceps reflex. Patient kept his knees flexed. Patient + Glabellar sign, + grasp reflex , + brisk jaw jerk, + palmomental reflex.   Sensory: Intact to LT bilaterally in all extremities. Coordination: Unable to comprehend FTN command Gait: deferred  ASSESSMENT/PLAN Mr. Dylan [name]. is a 80 y.o. 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 The patient appears to be at his reported baseline. Patient CT Head was negative and CTA was negative for emergent LVO. 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 At this time will continue to work up for stroke. Will hold off on consult to vascular at this moment due to current dx of TIA and patient moderate to severe dementia and advanced age. May consider consult once MRI has returned. However this stenosis does place patient at increased risk for stroke.   TIA likely hemodynamic in the setting of syncopal event and hypotension with transient worsening of old deficits.  Etiology of syncope could be New diagnosis of atrial fibrillation discovered on interrogation of pacemaker  Code Stroke: CT head No acute abnormality.Small vessel disease. Atrophy. ASPECTS 10.   CTA head & neck  1. Negative CTA for emergent large vessel occlusion. 2. 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 3. Extensive atheromatous change throughout the carotid siphons with associated moderate diffuse narrowing. 4. 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  Additional scattered mild to moderate bilateral V2 stenoses. 5. 5  mm right lower lobe nodule, indeterminate. No follow-up needed if patient is low-risk. Non-contrast chest CT can be considered in 12 months if patient is high-risk  MRI no acute stroke  2D Echo pending  LDL 65  HgbA1c 6.6  VTE prophylaxis - SCDs    Diet   Diet NPO time specified     No antithrombotic prior to admission, now on aspirin 81 mg daily. Pending MRI, at this time patient appears to have had TIA  Therapy recommendations:  PT/OT/SLP  Disposition:  Pending  Hypertension . Patient appears to have no home meds . Will monitor BP at this time  Hyperlipidemia  Home meds:  Pravastatin 20mg  daily, resumed in hospital  LDL 65, goal < 70  Diabetes type II   Home meds:  Trulicity 1.5mg  weekly, metformin 1000mg    HgbA1c 6.6, goal < 7.0  CBGs Recent Labs    02/09/21 2303 02/10/21 0828  GLUCAP 148* 129*      SSI  Other Stroke Risk Factors  Advanced Age >/= 3   Other Active Problems  Dementia w/ Parkinsonian features  Continue home meds: Sinemet 50-200 TID, Namenda 10mg  BID,  rivastigmine 4.6 patch  Hx of heart block:   Home med: Carvedilol 12.5 mg BID, currently being held due to episodes of bradycardia  Hospital day # 0  Damita Dunnings, MD PGY-1 I have personally obtained history,examined this patient, reviewed notes, independently viewed imaging studies, participated in medical decision making and plan of care.ROS completed by me personally and pertinent positives fully documented  I have made any additions or clarifications directly to the above note. Agree with note above.  Patient has baseline dementia and is a poor historian apparently had some transient left-sided weakness and right gaze deviation which resolved quickly in the setting of diaphoresis suggesting possibility of vasovagal episode.  Recommend check echocardiogram likely discharge after that.  .  Continue Sinemet and  Namenda nonetheless rivastigmine patch and the current dosages for his dementia with parkinsonian features.  Discussed with Dr. Loleta Books.  Greater than 50% time during this 35-minute visit was spent in counseling and coordination of care about his TIA and answering questions and discussion with care team ADDENDUM : I was informed by the electrophysiology team that pacemaker interrogation in this patient has shown runs of atrial fibrillation.  I recommend discontinuing aspirin and switching to Eliquis for stroke prevention.  I have consulted patient's cardiologist to see and follow the patient for A. fib management. Antony Contras, MD Medical Director [facility] Pager: [phone] 02/10/2021 12:12 PM  To contact Stroke Continuity provider, please refer to [email or web address] After hours, contact General Neurology

## 2021-02-11 ENCOUNTER — Other Ambulatory Visit (HOSPITAL_COMMUNITY): Payer: Self-pay

## 2021-02-11 DIAGNOSIS — G459 Transient cerebral ischemic attack, unspecified: Secondary | ICD-10-CM | POA: Diagnosis not present

## 2021-02-11 DIAGNOSIS — I10 Essential (primary) hypertension: Secondary | ICD-10-CM | POA: Diagnosis not present

## 2021-02-11 LAB — GLUCOSE, CAPILLARY
Glucose-Capillary: 136 mg/dL — ABNORMAL HIGH (ref 70–99)
Glucose-Capillary: 219 mg/dL — ABNORMAL HIGH (ref 70–99)
Glucose-Capillary: 170 mg/dL — ABNORMAL HIGH (ref 70–99)
Glucose-Capillary: 147 mg/dL — ABNORMAL HIGH (ref 70–99)

## 2021-02-11 LAB — ECHOCARDIOGRAM COMPLETE BUBBLE STUDY
Area-P 1/2: 2.39 cm2
Calc EF: 74.1 %
P 1/2 time: 662 msec
S' Lateral: 3.3 cm
Single Plane A2C EF: 67.5 %
Single Plane A4C EF: 78 %

## 2021-02-11 MED ORDER — CARVEDILOL 12.5 MG PO TABS: 25.0000 mg | ORAL_TABLET | Freq: Two times a day (BID) | ORAL | Status: DC

## 2021-02-11 MED ORDER — CARVEDILOL 12.5 MG PO TABS
12.5000 mg | ORAL_TABLET | Freq: Two times a day (BID) | ORAL | Status: DC
  Administered 2021-02-11 – 2021-02-12 (×2): 12.5 mg via ORAL
  Filled 2021-02-11 (×4): qty 1

## 2021-02-11 MED ORDER — HYDRALAZINE HCL 20 MG/ML IJ SOLN
5.0000 mg | INTRAMUSCULAR | Status: DC | PRN
  Administered 2021-02-11: 5 mg via INTRAVENOUS
  Filled 2021-02-11: qty 1

## 2021-02-11 MED ORDER — APIXABAN 5 MG PO TABS
5.0000 mg | ORAL_TABLET | Freq: Two times a day (BID) | ORAL | 0 refills | Status: AC
  Filled 2021-02-11: qty 60, 30d supply, fill #0

## 2021-02-11 NOTE — Plan of Care (Signed)
  Problem: Education: Goal: Knowledge of disease or condition will improve Outcome: Adequate for Discharge Goal: Knowledge of secondary prevention will improve Outcome: Adequate for Discharge Goal: Knowledge of patient specific risk factors addressed and post discharge goals established will improve Outcome: Adequate for Discharge Goal: Individualized Educational Video(s) Outcome: Adequate for Discharge   Problem: Coping: Goal: Will verbalize positive feelings about self Outcome: Adequate for Discharge Goal: Will identify appropriate support needs Outcome: Adequate for Discharge   Problem: Health Behavior/Discharge Planning: Goal: Ability to manage health-related needs will improve Outcome: Adequate for Discharge   Problem: Self-Care: Goal: Ability to participate in self-care as condition permits will improve Outcome: Adequate for Discharge Goal: Verbalization of feelings and concerns over difficulty with self-care will improve Outcome: Adequate for Discharge Goal: Ability to communicate needs accurately will improve Outcome: Adequate for Discharge   Problem: Nutrition: Goal: Risk of aspiration will decrease Outcome: Adequate for Discharge   Problem: Ischemic Stroke/TIA Tissue Perfusion: Goal: Complications of ischemic stroke/TIA will be minimized Outcome: Adequate for Discharge   Problem: Spontaneous Subarachnoid Hemorrhage Tissue Perfusion: Goal: Complications of Spontaneous Subarachnoid Hemorrhage will be minimized Outcome: Adequate for Discharge

## 2021-02-11 NOTE — Progress Notes (Addendum)
At 11:45 am spoke to patients wife over the phone about d/cing home today. She stated she was at MD appt and had another appointment after that. CM asked her to please call CM when she was finished to go over d/c plans and transport home for the patient. She voiced agreement.   1508: CM called wife again and no answer.   1617: Attempted wife again and it goes straight to voicemail. Charge RN updated and will update bedside RN.

## 2021-02-11 NOTE — Progress Notes (Signed)
Patients wife called back, informed her of discharge and paperwork as well as medication Eliquis being sent home with patient and paperwork. Wife verbalized understanding and gave their address. Informed her that this nurse will call her back with ETA.

## 2021-02-11 NOTE — Discharge Instructions (Signed)

## 2021-02-11 NOTE — Progress Notes (Signed)
Dreama from medic took information and informed this nurse that it will be about 2-3 hours before patient is picked up.   Wife called and updated about pick up time.

## 2021-02-11 NOTE — Discharge Summary (Addendum)
Physician Discharge Summary  Dylan [name]. JSE:[id number] DOB: 1941/04/18 DOA: 02/09/2021  PCP: [name], MD  Admit date: 02/09/2021 Discharge date: 02/12/2021  Admitted From: Home  Disposition:  Home with [facility]   Recommendations for Outpatient Follow-up:  1. Follow up with PCP [name] in 1 week given new onset Afib 2. [name]: Please obtain CBC at follow up and please discuss long-term placement with family 3. Follow up with Dr. [name] for new onset Afib     Home Health: PT/OT due to mostly bedbound status  Equipment/Devices: None new  Discharge Condition: Stable, poor  CODE STATUS: FULL Diet recommendation: Cardiac  Brief/Interim Summary: Dylan [name] is a 80 y.o. M with Parkinson's disease, dementia, lives at home, primarily bedbound and wheelchair-bound, history of stroke, and recent PPM for complete heart block as well as HTN and DM who presented after sudden unresponsive episode.  Was at home with wife when he was on the toilet, became suddenly diaphoretic, and then unresponsive.  EMS were activated who thought he had a right gaze preference and decreased left-sided strength and called code stroke in the field.  In the ER, CT head unremarkable, tPA not administered, due to the fact that he appeared to be at recent baseline by the time of arrival back to the ER, CT angiogram of the head and neck showed no large vessel occlusion.     PRINCIPAL HOSPITAL DIAGNOSIS: Vasovagal syncope in the setting of new onset atrial fibrillation    Discharge Diagnoses:   Vasovagal syncope Strokelike symptoms, stroke ruled out The patient was admitted, underwent MRI that was unremarkable.  After subsequent review of his CT angiogram and MRI by neurology, was felt strongly that the patient had a vagal episode, and that he had not had a TIA or stroke.  He was 24 hours, had no arrhythmia on telemetry, and appeared at his baseline physical function and cognitive function.  New  onset atrial fibrillation, paroxysmal Patient's new pacemaker was interrogated on admission and showed several episodes of brief atrial fibrillation.  Cardiology were consulted, who recommended initiation of apixaban.  Rates were controlled.  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  Here, your medical work up was reassuring for serious causes of passing out.  We believe that you had a simple faint.  We ruled out that you had a stroke or heart attack.  We do not believe you have an infection.  You were observed for 36 hours and had no further events.  You should follow up with your primary care doctor in 1 week for hospital follow up as a routine   While you were here, completely unrelated, Dr. Leonie Man and Dr. Irven Shelling partner Dr. Virgina Jock discovered from your pacemaker read out that you have having episodes of Atrial fibrillation.  This is managed with medicines like carvedilol (which you are already taking, and should continue without change) as well as blood thinners, to reduce the risk of stroke   You should start apixaban/Eliquis 5 mg twice daily   Go to Dr. [name] in 1-2 weeks  Increase activity slowly   Complete by: As directed    Increase activity slowly   Complete by: As directed    Increase activity slowly   Complete by: As directed      Allergies as of 02/12/2021      Reactions   Quetiapine Other (See Comments)   Agitation      Medication List    TAKE these medications   carbidopa-levodopa 25-100 MG  tablet Commonly known as: SINEMET IR Take 2 tablets by mouth 3 (three) times daily. What changed:   how much to take  when to take this  additional instructions   carvedilol 12.5 MG tablet Commonly known as: COREG TAKE 1 TABLET BY MOUTH 2 TIMES DAILY. What changed:   how much to take  when to take this   desonide 0.05 % lotion Commonly known as: DesOwen Apply topically 2 times per day prn What changed:   how much to take  how to take this  when to take this  reasons to take this  additional instructions   diclofenac Sodium 1 % Gel Commonly known as: Voltaren Apply 2 g topically 3 (three) times daily. What changed:   when to take this  reasons to take this   Eliquis 5 MG Tabs tablet Generic drug: apixaban Take 1 tablet (5 mg total) by mouth 2 (two) times daily.   Lancets Misc Please fill basic/generic push button lancets.  Patient to test blood sugar twice daily. DX: E11.65   linaclotide 72 MCG capsule Commonly known as: Linzess Take 1 capsule (72 mcg total) by mouth daily before breakfast.   Melatonin 10 MG Tabs Take 10-20 mg by mouth at bedtime as needed (for sleep).   memantine 10 MG tablet Commonly known as: NAMENDA TAKE 1 TABLET BY MOUTH TWICE A DAY   metFORMIN 1000 MG tablet Commonly known as: GLUCOPHAGE Take 1 tablet by mouth daily What changed:   how much to take  how to take this  when to take this  additional instructions   OASIS TEARS PF OP Place 1 drop into both eyes in the morning.   ONE TOUCH DELICA LANCING DEV Misc Please fill ONE TOUCH DELICA LANCING DEVICE.  Use to test blood sugar twice daily as directed. E11.65   OneTouch Verio test strip Generic drug: glucose blood Use as instructed to check blood sugars daily dx: e11.22   pravastatin 20 MG tablet Commonly known as: PRAVACHOL TAKE 1 TABLET BY ORAL ROUTE EVERY DAY What changed: See the new instructions.   QUEtiapine 25 MG tablet Commonly known as:  SEROQUEL TAKE 1 TABLET (25 MG TOTAL) BY MOUTH AT BEDTIME AS NEEDED.   rivastigmine 4.6 mg/24hr Commonly known as: EXELON Place 1 patch (4.6 mg total) onto the skin daily. What changed: when to take this   senna-docusate 8.6-50 MG tablet Commonly known as: Senokot-S Take 1 tablet by mouth every other day.   Trulicity 1.5 UR/4.2HC Sopn Generic drug: Dulaglutide INJECT 1.5 MG INTO THE SKIN ONCE A WEEK. What changed: when to take this   vitamin C 1000 MG tablet Take 1,000 mg by mouth every morning.   Vitamin D3 125 MCG (5000 UT) Caps Take 5,000 Units by mouth at bedtime.       Follow-up Information    [facility] [place] [facility].   Specialty: Home Health Services Why: The home health agency will contact you for the first home visit. Contact information: [address] [place] [address] [phone]  Allergies  Allergen Reactions  . Quetiapine Other (See Comments)    Agitation     Consultations:  Neurology  Cardiology   Procedures/Studies: MR BRAIN WO CONTRAST  Result Date: 02/10/2021 CLINICAL DATA:  Right gaze preference and decreased left grip strength. EXAM: MRI HEAD WITHOUT CONTRAST TECHNIQUE: Multiplanar, multiecho pulse sequences of the brain and surrounding structures were obtained without intravenous contrast. COMPARISON:  Head CT and CTA 02/09/2021 FINDINGS: Brain: There is no evidence of an acute infarct, intracranial hemorrhage, mass, midline shift, or extra-axial fluid collection. T2 hyperintensities in the cerebral white matter bilaterally are nonspecific but compatible with mild chronic small vessel ischemic disease. There is moderate generalized cerebral atrophy. Vascular: Major intracranial vascular flow voids are preserved. Skull and upper cervical spine: Unremarkable bone marrow signal. Sinuses/Orbits: Bilateral cataract extraction. Clear paranasal sinuses. Small right mastoid effusion. Other: None. IMPRESSION: 1. No  acute intracranial abnormality. 2. Mild chronic small vessel ischemic disease and moderate cerebral atrophy. Electronically Signed   By: Logan Bores M.D.   On: 02/10/2021 11:22   ECHOCARDIOGRAM COMPLETE BUBBLE STUDY  Result Date: 02/11/2021    ECHOCARDIOGRAM REPORT   Patient Name:   DARTANION TEO Date of Exam: 02/10/2021 Medical Rec #:  [id number]      Height:       66.0 in Accession #:    [id number]     Weight:       159.6 lb Date of Birth:  Nov 21, 1940      BSA:          1.817 m Patient Age:    69 years       BP:           141/55 mmHg Patient Gender: M              HR:           79 bpm. Exam Location:  Inpatient Procedure: 2D Echo, Cardiac Doppler, Color Doppler, Intracardiac Opacification            Agent and Saline Contrast Bubble Study Indications:     Syncope 780.2 / R55  History:         Patient has prior history of Echocardiogram examinations, most                  recent 04/28/2020. Pacemaker, PAD, Aortic Valve Disease,                  Arrythmias:Second degree AV block; Risk Factors:Diabetes,                  Hypertension and Dyslipidemia. Chronic kidney disease.                  Parkinson's disease.  Sonographer:     Darlina Sicilian RDCS Referring Phys:  [id number] Rhetta Mura Diagnosing Phys: Vernell Leep MD IMPRESSIONS  1. Left ventricular ejection fraction, by estimation, is 60 to 65%. The left ventricle has normal function. The left ventricle has no regional wall motion abnormalities. Left ventricular diastolic parameters are consistent with Grade I diastolic dysfunction (impaired relaxation).  2. Right ventricular systolic function is normal. The right ventricular size is normal. There is normal pulmonary artery systolic pressure.  3. Tricuspid valve regurgitation is moderate.  4. The aortic valve is tricuspid. Aortic valve regurgitation is mild. Mild aortic valve sclerosis is present, with no evidence of aortic valve stenosis.  5. Agitated saline contrast bubble study was negative, with no  evidence of any interatrial shunt.  6. Compared to previous study on 04/28/2020, mitral regurgitation not seen. Tricuspid regurgitation is now moderate, was mild then. FINDINGS  Left Ventricle: Left ventricular ejection fraction, by estimation, is 60 to 65%. The left ventricle has normal function. The left ventricle has no regional wall motion abnormalities. Definity contrast agent was given IV to delineate the left ventricular  endocardial borders. The left ventricular internal cavity size was normal in size. There is no left ventricular hypertrophy. Abnormal (paradoxical) septal motion, consistent with left bundle branch block. Left ventricular diastolic parameters are consistent with Grade I diastolic dysfunction (impaired relaxation). Right Ventricle: The right ventricular size is normal. No increase in right ventricular wall thickness. Right ventricular systolic function is normal. There is normal pulmonary artery systolic pressure. The tricuspid regurgitant velocity is 2.62 m/s, and  with an assumed right atrial pressure of 8 mmHg, the estimated right ventricular systolic pressure is 97.6 mmHg. Left Atrium: Left atrial size was normal in size. Right Atrium: Right atrial size was normal in size. Pericardium: There is no evidence of pericardial effusion. Mitral Valve: The mitral valve is grossly normal. No evidence of mitral valve regurgitation. Tricuspid Valve: The tricuspid valve is grossly normal. Tricuspid valve regurgitation is moderate. Aortic Valve: The aortic valve is tricuspid. Aortic valve regurgitation is mild. Aortic regurgitation PHT measures 662 msec. Mild aortic valve sclerosis is present, with no evidence of aortic valve stenosis. Pulmonic Valve: The pulmonic valve was grossly normal. Pulmonic valve regurgitation is not visualized. Aorta: The aortic root and ascending aorta are structurally normal, with no evidence of dilitation. IAS/Shunts: No atrial level shunt detected by color flow Doppler.  Agitated saline contrast was given intravenously to evaluate for intracardiac shunting. Agitated saline contrast bubble study was negative, with no evidence of any interatrial shunt. Additional Comments: A device lead is visualized.  LEFT VENTRICLE PLAX 2D LVIDd:         4.80 cm     Diastology LVIDs:         3.30 cm     LV e' medial:    3.26 cm/s LV PW:         0.90 cm     LV E/e' medial:  15.9 LV IVS:        0.90 cm     LV e' lateral:   5.66 cm/s LVOT diam:     2.10 cm     LV E/e' lateral: 9.2 LV SV:         57 LV SV Index:   31 LVOT Area:     3.46 cm  LV Volumes (MOD) LV vol d, MOD A2C: 69.2 ml LV vol d, MOD A4C: 58.1 ml LV vol s, MOD A2C: 22.5 ml LV vol s, MOD A4C: 12.8 ml LV SV MOD A2C:     46.7 ml LV SV MOD A4C:     58.1 ml LV SV MOD BP:      48.4 ml RIGHT VENTRICLE RV S prime:     12.70 cm/s TAPSE (M-mode): 1.8 cm LEFT ATRIUM             Index LA diam:        4.30 cm 2.37 cm/m LA Vol (A2C):   31.3 ml 17.23 ml/m LA Vol (A4C):   20.0 ml 11.01 ml/m LA Biplane Vol: 27.2 ml 14.97 ml/m  AORTIC VALVE LVOT Vmax:   87.50 cm/s LVOT Vmean:  49.300 cm/s LVOT VTI:    0.165 m AI PHT:      734  msec  AORTA Ao Root diam: 2.80 cm MITRAL VALVE               TRICUSPID VALVE MV Area (PHT): 2.39 cm    TR Peak grad:   27.5 mmHg MV Decel Time: 317 msec    TR Vmax:        262.00 cm/s MV E velocity: 51.80 cm/s MV A velocity: 89.50 cm/s  SHUNTS MV E/A ratio:  0.58        Systemic VTI:  0.16 m                            Systemic Diam: 2.10 cm Vernell Leep MD Electronically signed by Vernell Leep MD Signature Date/Time: 02/11/2021/8:45:31 AM    Final    CT HEAD CODE STROKE WO CONTRAST  Result Date: 02/09/2021 CLINICAL DATA:  Code stroke. Initial evaluation for neuro deficit, stroke suspected. EXAM: CT HEAD WITHOUT CONTRAST TECHNIQUE: Contiguous axial images were obtained from the base of the skull through the vertex without intravenous contrast. COMPARISON:  Prior CT from 04/27/2020 FINDINGS: Brain: Moderately advanced  age-related cerebral atrophy with chronic small vessel ischemic disease. No acute intracranial hemorrhage. Linear hyperdensity at the left basal ganglia most consistent with calcification/mineralization, stable from previous. No acute large vessel territory infarct. No mass lesion, midline shift or mass effect. Mild ventricular prominence related to global parenchymal volume loss of hydrocephalus. No extra-axial fluid collection. Vascular: No hyperdense vessel. Scattered vascular calcifications noted within the carotid siphons. Skull: Scalp soft tissues and calvarium within normal limits. Sinuses/Orbits: Globes and orbital soft tissues within normal limits. Paranasal sinuses are largely clear. Trace right mastoid effusion noted, of doubtful significance. Other: None. ASPECTS [facility] Stroke Program Early CT Score) - Ganglionic level infarction (caudate, lentiform nuclei, internal capsule, insula, M1-M3 cortex): 7 - Supraganglionic infarction (M4-M6 cortex): 3 Total score (0-10 with 10 being normal): 10 IMPRESSION: 1. No acute intracranial infarct or other abnormality. 2. ASPECTS is 10. 3. Moderately advanced cerebral atrophy with chronic small vessel ischemic disease. These results were communicated to Dr. Quinn Axe at 11:20 pmon 4/12/2022by text page via the [facility] messaging system. Electronically Signed   By: Jeannine Boga M.D.   On: 02/09/2021 23:24   CT ANGIO HEAD CODE STROKE  Result Date: 02/09/2021 CLINICAL DATA:  Initial evaluation for acute stroke, right gaze, left arm weakness, slurred speech. EXAM: CT ANGIOGRAPHY HEAD AND NECK TECHNIQUE: Multidetector CT imaging of the head and neck was performed using the standard protocol during bolus administration of intravenous contrast. Multiplanar CT image reconstructions and MIPs were obtained to evaluate the vascular anatomy. Carotid stenosis measurements (when applicable) are obtained utilizing NASCET criteria, using the distal internal carotid diameter as  the denominator. CONTRAST:  24mL OMNIPAQUE IOHEXOL 350 MG/ML SOLN COMPARISON:  Prior noncontrast head CT from earlier the same day. FINDINGS: CTA NECK FINDINGS Aortic arch: Visualized aortic arch normal in caliber with normal branch pattern. Moderate atheromatous change about the arch and origin of the great vessels without hemodynamically significant stenosis. Right carotid system: Scattered mixed plaque about the right CCA without high-grade stenosis. Extensive bulky calcified plaque about the right carotid bulb/proximal right ICA with associated stenosis of up to 70% by NASCET criteria. Right ICA patent distally to the skull base without stenosis, dissection or occlusion. Left carotid system: Left CCA patent from its origin to the bifurcation without stenosis. Bulky calcified plaque seen about the left carotid bulb/proximal left ICA with associated stenosis of  up to 70% by NASCET criteria. Left ICA patent distally to the skull base without stenosis, dissection or occlusion. Vertebral arteries: Both vertebral arteries arise from the subclavian arteries. No proximal subclavian artery stenosis. Atheromatous plaque at the origin of the left vertebral artery with associated severe ostial stenosis. Plaque at the origin of the right vertebral artery with no more than mild narrowing. Additional multifocal scattered calcified plaque within the V2 segments bilaterally with associated mild to moderate multifocal stenoses. Vertebral arteries remain patent to the skull base without dissection or vascular occlusion. Skeleton: No visible acute osseous finding. No discrete or worrisome osseous lesions. Bulky anterior endplate osteophytic spurring noted at C4-5 through C6-7. Patient is edentulous. Other neck: No other acute soft tissue abnormality within the neck. No mass or adenopathy. Upper chest: Visualized upper chest demonstrates no acute finding. Left-sided pacemaker/AICD noted. 5 mm nodule noted at the superior segment of  the right lower lobe, indeterminate. Review of the MIP images confirms the above findings CTA HEAD FINDINGS Anterior circulation: Petrous segments patent bilaterally. Extensive atheromatous plaque throughout the carotid siphons with associated moderate diffuse narrowing. A1 segments patent bilaterally. Normal anterior communicating artery complex. Anterior cerebral arteries patent to their distal aspects without stenosis. No M1 stenosis or occlusion. Normal MCA bifurcations. No proximal MCA branch occlusion. Distal MCA branches well perfused and symmetric. Posterior circulation: Both V4 segments patent to the vertebrobasilar junction without stenosis. Both PICA origins patent and normal. Basilar patent to its distal aspect without stenosis. Superior cerebral arteries patent bilaterally. Both PCAs primarily supplied via the basilar. Short-segment mild to moderate proximal left P2 stenosis noted. PCAs otherwise patent to their distal aspects without hemodynamically significant stenosis. Venous sinuses: Grossly patent allowing for timing the contrast bolus. Anatomic variants: None significant.  No intracranial aneurysm. Review of the MIP images confirms the above findings IMPRESSION: 1. Negative CTA for emergent large vessel occlusion. 2. 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 3. Extensive atheromatous change throughout the carotid siphons with associated moderate diffuse narrowing. 4. 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 Additional scattered mild to moderate bilateral V2 stenoses. 5. 5 mm right lower lobe nodule, indeterminate. No follow-up needed if patient is low-risk. Non-contrast chest CT can be considered in 12 months if patient is high-risk. This recommendation follows the consensus statement: Guidelines for Management of Incidental Pulmonary Nodules Detected on CT Images: From the  Fleischner Society 2017; Radiology 2017; 284:228-243. These results were communicated to Dr. Quinn Axe at 11:35 [place] 4/12/2022by text page via the [facility] messaging system. Electronically Signed   By: Jeannine Boga M.D.   On: 02/09/2021 23:52   CT ANGIO NECK CODE STROKE  Result Date: 02/09/2021 CLINICAL DATA:  Initial evaluation for acute stroke, right gaze, left arm weakness, slurred speech. EXAM: CT ANGIOGRAPHY HEAD AND NECK TECHNIQUE: Multidetector CT imaging of the head and neck was performed using the standard protocol during bolus administration of intravenous contrast. Multiplanar CT image reconstructions and MIPs were obtained to evaluate the vascular anatomy. Carotid stenosis measurements (when applicable) are obtained utilizing NASCET criteria, using the distal internal carotid diameter as the denominator. CONTRAST:  43mL OMNIPAQUE IOHEXOL 350 MG/ML SOLN COMPARISON:  Prior noncontrast head CT from earlier the same day. FINDINGS: CTA NECK FINDINGS Aortic arch: Visualized aortic arch normal in caliber with normal branch pattern. Moderate atheromatous change about the arch and origin of the great vessels without hemodynamically significant stenosis. Right  carotid system: Scattered mixed plaque about the right CCA without high-grade stenosis. Extensive bulky calcified plaque about the right carotid bulb/proximal right ICA with associated stenosis of up to 70% by NASCET criteria. Right ICA patent distally to the skull base without stenosis, dissection or occlusion. Left carotid system: Left CCA patent from its origin to the bifurcation without stenosis. Bulky calcified plaque seen about the left carotid bulb/proximal left ICA with associated stenosis of up to 70% by NASCET criteria. Left ICA patent distally to the skull base without stenosis, dissection or occlusion. Vertebral arteries: Both vertebral arteries arise from the subclavian arteries. No proximal subclavian artery stenosis. Atheromatous plaque  at the origin of the left vertebral artery with associated severe ostial stenosis. Plaque at the origin of the right vertebral artery with no more than mild narrowing. Additional multifocal scattered calcified plaque within the V2 segments bilaterally with associated mild to moderate multifocal stenoses. Vertebral arteries remain patent to the skull base without dissection or vascular occlusion. Skeleton: No visible acute osseous finding. No discrete or worrisome osseous lesions. Bulky anterior endplate osteophytic spurring noted at C4-5 through C6-7. Patient is edentulous. Other neck: No other acute soft tissue abnormality within the neck. No mass or adenopathy. Upper chest: Visualized upper chest demonstrates no acute finding. Left-sided pacemaker/AICD noted. 5 mm nodule noted at the superior segment of the right lower lobe, indeterminate. Review of the MIP images confirms the above findings CTA HEAD FINDINGS Anterior circulation: Petrous segments patent bilaterally. Extensive atheromatous plaque throughout the carotid siphons with associated moderate diffuse narrowing. A1 segments patent bilaterally. Normal anterior communicating artery complex. Anterior cerebral arteries patent to their distal aspects without stenosis. No M1 stenosis or occlusion. Normal MCA bifurcations. No proximal MCA branch occlusion. Distal MCA branches well perfused and symmetric. Posterior circulation: Both V4 segments patent to the vertebrobasilar junction without stenosis. Both PICA origins patent and normal. Basilar patent to its distal aspect without stenosis. Superior cerebral arteries patent bilaterally. Both PCAs primarily supplied via the basilar. Short-segment mild to moderate proximal left P2 stenosis noted. PCAs otherwise patent to their distal aspects without hemodynamically significant stenosis. Venous sinuses: Grossly patent allowing for timing the contrast bolus. Anatomic variants: None significant.  No intracranial  aneurysm. Review of the MIP images confirms the above findings IMPRESSION: 1. Negative CTA for emergent large vessel occlusion. 2. 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 3. Extensive atheromatous change throughout the carotid siphons with associated moderate diffuse narrowing. 4. 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 Additional scattered mild to moderate bilateral V2 stenoses. 5. 5 mm right lower lobe nodule, indeterminate. No follow-up needed if patient is low-risk. Non-contrast chest CT can be considered in 12 months if patient is high-risk. This recommendation follows the consensus statement: Guidelines for Management of Incidental Pulmonary Nodules Detected on CT Images: From the Fleischner Society 2017; Radiology 2017; 284:228-243. These results were communicated to Dr. Quinn Axe at 11:35 [place] 4/12/2022by text page via the [facility] messaging system. Electronically Signed   By: Jeannine Boga M.D.   On: 02/09/2021 23:52      Subjective: Patient has no headache, chest pain, respiratory distress, abdominal pain.  No palpitations.  His appetite is good.  He feels at his baseline.  Discharge Exam: Vitals:   02/12/21 0700 02/12/21 1134  BP: (!) 90/57 (!) 107/55  Pulse: 78 77  Resp: 19   Temp: 98.1 F (36.7 C) 97.7 F (  36.5 C)  SpO2: 99% 100%   Vitals:   02/11/21 2344 02/12/21 0426 02/12/21 0700 02/12/21 1134  BP: 118/78 107/62 (!) 90/57 (!) 107/55  Pulse: 90 69 78 77  Resp: (!) 22 20 19    Temp: 97.8 F (36.6 C) (!) 97.4 F (36.3 C) 98.1 F (36.7 C) 97.7 F (36.5 C)  TempSrc: Axillary Oral Oral Oral  SpO2:  97% 99% 100%  Weight:        General: Pt is alert, awake, not in acute distress, watching television Cardiovascular: RRR, nl S1-S2, no murmurs appreciated.   No LE edema.   Respiratory: Normal respiratory rate and rhythm.  CTAB without rales or  wheezes. Abdominal: Abdomen soft and non-tender.  No distension or HSM.   Neuro/Psych: Contractures on the left side.  Speech is very dysarthric.  Judgment and insight appear moderately impaired, although this is difficult due to his dysarthria and aphasia.     The results of significant diagnostics from this hospitalization (including imaging, microbiology, ancillary and laboratory) are listed below for reference.     Microbiology: Recent Results (from the past 240 hour(s))  Resp Panel by RT-PCR (Flu A&B, Covid) Nasopharyngeal Swab     Status: None   Collection Time: 02/10/21 12:37 AM   Specimen: Nasopharyngeal Swab; Nasopharyngeal(NP) swabs in vial transport medium  Result Value Ref Range Status   SARS Coronavirus 2 by RT PCR NEGATIVE NEGATIVE Final    Comment: (NOTE) SARS-CoV-2 target nucleic acids are NOT DETECTED.  The SARS-CoV-2 RNA is generally detectable in upper respiratory specimens during the acute phase of infection. The lowest concentration of SARS-CoV-2 viral copies this assay can detect is 138 copies/mL. A negative result does not preclude SARS-Cov-2 infection and should not be used as the sole basis for treatment or other patient management decisions. A negative result may occur with  improper specimen collection/handling, submission of specimen other than nasopharyngeal swab, presence of viral mutation(s) within the areas targeted by this assay, and inadequate number of viral copies(<138 copies/mL). A negative result must be combined with clinical observations, patient history, and epidemiological information. The expected result is Negative.  Fact Sheet for Patients:  EntrepreneurPulse.com.au  Fact Sheet for Healthcare Providers:  IncredibleEmployment.be  This test is no t yet approved or cleared by the [place] FDA and  has been authorized for detection and/or diagnosis of SARS-CoV-2 by FDA under an Emergency Use  Authorization (EUA). This EUA will remain  in effect (meaning this test can be used) for the duration of the COVID-19 declaration under Section 564(b)(1) of the Act, 21 U.S.C.section 360bbb-3(b)(1), unless the authorization is terminated  or revoked sooner.       Influenza A by PCR NEGATIVE NEGATIVE Final   Influenza B by PCR NEGATIVE NEGATIVE Final    Comment: (NOTE) The Xpert Xpress SARS-CoV-2/FLU/RSV plus assay is intended as an aid in the diagnosis of influenza from Nasopharyngeal swab specimens and should not be used as a sole basis for treatment. Nasal washings and aspirates are unacceptable for Xpert Xpress SARS-CoV-2/FLU/RSV testing.  Fact Sheet for Patients: EntrepreneurPulse.com.au  Fact Sheet for Healthcare Providers: IncredibleEmployment.be  This test is not yet approved or cleared by the [place] FDA and has been authorized for detection and/or diagnosis of SARS-CoV-2 by FDA under an Emergency Use Authorization (EUA). This EUA will remain in effect (meaning this test can be used) for the duration of the COVID-19 declaration under Section 564(b)(1) of the Act, 21 U.S.C. section 360bbb-3(b)(1), unless the authorization is  terminated or revoked.  Performed at [facility], [place] [address], [place], [place] [address]      Labs: BNP (last 3 results) No results for input(s): BNP in the last 8760 hours. Basic Metabolic Panel: Recent Labs  Lab 02/09/21 2305 02/09/21 2313 02/10/21 0440  NA 139 141 140  K 4.0 4.0 4.0  CL 104 103 107  CO2 28  --  26  GLUCOSE 163* 152* 144*  BUN 21 24* 22  CREATININE 1.28* 1.20 1.14  CALCIUM 9.4  --  9.6  MG  --   --  1.7   Liver Function Tests: Recent Labs  Lab 02/09/21 2305  AST 17  ALT <5  ALKPHOS 83  BILITOT 0.8  PROT 6.7  ALBUMIN 3.5   No results for input(s): LIPASE, AMYLASE in the last 168 hours. No results for input(s): AMMONIA in the last 168 hours. CBC: Recent  Labs  Lab 02/09/21 2305 02/09/21 2313 02/10/21 0440  WBC 5.4  --  5.2  NEUTROABS 2.7  --   --   HGB 12.9* 13.3 12.8*  HCT 39.7 39.0 39.3  MCV 88.4  --  87.9  PLT 160  --  169   Cardiac Enzymes: No results for input(s): CKTOTAL, CKMB, CKMBINDEX, TROPONINI in the last 168 hours. BNP: Invalid input(s): POCBNP CBG: Recent Labs  Lab 02/11/21 1207 02/11/21 1530 02/11/21 2107 02/12/21 0632 02/12/21 1200  GLUCAP 219* 170* 147* 140* 248*   D-Dimer No results for input(s): DDIMER in the last 72 hours. Hgb A1c No results for input(s): HGBA1C in the last 72 hours. Lipid Profile Recent Labs    02/10/21 0145  LDLDIRECT 49.6   Thyroid function studies Recent Labs    02/10/21 0440  TSH 0.447   Anemia work up No results for input(s): VITAMINB12, FOLATE, FERRITIN, TIBC, IRON, RETICCTPCT in the last 72 hours. Urinalysis    Component Value Date/Time   COLORURINE YELLOW 02/10/2021 0832   APPEARANCEUR CLEAR 02/10/2021 0832   LABSPEC 1.036 (H) 02/10/2021 0832   PHURINE 6.0 02/10/2021 0832   GLUCOSEU NEGATIVE 02/10/2021 0832   HGBUR NEGATIVE 02/10/2021 0832   BILIRUBINUR NEGATIVE 02/10/2021 0832   BILIRUBINUR negative 11/03/2020 1747   KETONESUR 5 (A) 02/10/2021 0832   PROTEINUR NEGATIVE 02/10/2021 0832   UROBILINOGEN 0.2 11/03/2020 1747   NITRITE NEGATIVE 02/10/2021 0832   LEUKOCYTESUR NEGATIVE 02/10/2021 0832   Sepsis Labs Invalid input(s): PROCALCITONIN,  WBC,  LACTICIDVEN Microbiology Recent Results (from the past 240 hour(s))  Resp Panel by RT-PCR (Flu A&B, Covid) Nasopharyngeal Swab     Status: None   Collection Time: 02/10/21 12:37 AM   Specimen: Nasopharyngeal Swab; Nasopharyngeal(NP) swabs in vial transport medium  Result Value Ref Range Status   SARS Coronavirus 2 by RT PCR NEGATIVE NEGATIVE Final    Comment: (NOTE) SARS-CoV-2 target nucleic acids are NOT DETECTED.  The SARS-CoV-2 RNA is generally detectable in upper respiratory specimens during the acute  phase of infection. The lowest concentration of SARS-CoV-2 viral copies this assay can detect is 138 copies/mL. A negative result does not preclude SARS-Cov-2 infection and should not be used as the sole basis for treatment or other patient management decisions. A negative result may occur with  improper specimen collection/handling, submission of specimen other than nasopharyngeal swab, presence of viral mutation(s) within the areas targeted by this assay, and inadequate number of viral copies(<138 copies/mL). A negative result must be combined with clinical observations, patient history, and epidemiological information. The expected result is  Negative.  Fact Sheet for Patients:  EntrepreneurPulse.com.au  Fact Sheet for Healthcare Providers:  IncredibleEmployment.be  This test is no t yet approved or cleared by the [place] FDA and  has been authorized for detection and/or diagnosis of SARS-CoV-2 by FDA under an Emergency Use Authorization (EUA). This EUA will remain  in effect (meaning this test can be used) for the duration of the COVID-19 declaration under Section 564(b)(1) of the Act, 21 U.S.C.section 360bbb-3(b)(1), unless the authorization is terminated  or revoked sooner.       Influenza A by PCR NEGATIVE NEGATIVE Final   Influenza B by PCR NEGATIVE NEGATIVE Final    Comment: (NOTE) The Xpert Xpress SARS-CoV-2/FLU/RSV plus assay is intended as an aid in the diagnosis of influenza from Nasopharyngeal swab specimens and should not be used as a sole basis for treatment. Nasal washings and aspirates are unacceptable for Xpert Xpress SARS-CoV-2/FLU/RSV testing.  Fact Sheet for Patients: EntrepreneurPulse.com.au  Fact Sheet for Healthcare Providers: IncredibleEmployment.be  This test is not yet approved or cleared by the [place] FDA and has been authorized for detection and/or diagnosis of  SARS-CoV-2 by FDA under an Emergency Use Authorization (EUA). This EUA will remain in effect (meaning this test can be used) for the duration of the COVID-19 declaration under Section 564(b)(1) of the Act, 21 U.S.C. section 360bbb-3(b)(1), unless the authorization is terminated or revoked.  Performed at [facility], [place] [address], [place], [place] [address]      Time coordinating discharge: 35 minutes         SIGNED:   Donnamae Jude, MD  [facility] 02/12/2021, 12:04 PM

## 2021-02-12 LAB — GLUCOSE, CAPILLARY
Glucose-Capillary: 140 mg/dL — ABNORMAL HIGH (ref 70–99)
Glucose-Capillary: 248 mg/dL — ABNORMAL HIGH (ref 70–99)
Glucose-Capillary: 130 mg/dL — ABNORMAL HIGH (ref 70–99)
Glucose-Capillary: 181 mg/dL — ABNORMAL HIGH (ref 70–99)

## 2021-02-12 NOTE — Plan of Care (Signed)
  Problem: Education: Goal: Knowledge of disease or condition will improve Outcome: Adequate for Discharge Goal: Knowledge of secondary prevention will improve Outcome: Adequate for Discharge Goal: Knowledge of patient specific risk factors addressed and post discharge goals established will improve Outcome: Adequate for Discharge Goal: Individualized Educational Video(s) Outcome: Adequate for Discharge   Problem: Coping: Goal: Will verbalize positive feelings about self Outcome: Adequate for Discharge Goal: Will identify appropriate support needs Outcome: Adequate for Discharge   Problem: Health Behavior/Discharge Planning: Goal: Ability to manage health-related needs will improve Outcome: Adequate for Discharge   Problem: Self-Care: Goal: Ability to participate in self-care as condition permits will improve Outcome: Adequate for Discharge Goal: Verbalization of feelings and concerns over difficulty with self-care will improve Outcome: Adequate for Discharge Goal: Ability to communicate needs accurately will improve Outcome: Adequate for Discharge   Problem: Nutrition: Goal: Risk of aspiration will decrease Outcome: Adequate for Discharge   Problem: Ischemic Stroke/TIA Tissue Perfusion: Goal: Complications of ischemic stroke/TIA will be minimized Outcome: Adequate for Discharge   Problem: Spontaneous Subarachnoid Hemorrhage Tissue Perfusion: Goal: Complications of Spontaneous Subarachnoid Hemorrhage will be minimized Outcome: Adequate for Discharge

## 2021-02-12 NOTE — TOC Transition Note (Signed)
Transition of Care [facility]) - CM/SW Discharge Note   Patient Details  Name: Dylan [name]. MRN: [id number] Date of Birth: Aug 27, 1941  Transition of Care [facility]) CM/SW Contact:  Pollie Friar, RN Phone Number: 02/12/2021, 12:05 PM   Clinical Narrative:    Patient is discharging home tonight with [facility] services arranged through [place]. Pt has all needed DME at home. Wife asked that patient arrive via [facility] to the home before 4 pm or after 8 pm. CM unable to verify time that he would arrive at the home. She asked to speak to the ambulance service and CM provided the number. She called and ambulance stated the same. Wife is asking that transport home be set up for 8 pm. CM has made these arrangements with [facility]. Bedside RN updated and d/c packet at the desk.    Final next level of care: Home w Home Health Services Barriers to Discharge: No Barriers Identified   Patient Goals and CMS Choice        Discharge Placement                       Discharge Plan and Services                          HH Arranged: PT,OT,RN,Nurse's Aide,Social Work [place]: [facility] [employer] Date [place]: 02/12/21   Representative spoke with at [place]: [place]) Interventions     Readmission Risk Interventions No flowsheet data found.

## 2021-02-12 NOTE — Care Management Obs Status (Signed)
[place] NOTIFICATION   Patient Details  Name: Dylan [name]. MRN: [id number] Date of Birth: 10-Feb-1941   Medicare Observation Status Notification Given:  Yes    Pollie Friar, RN 02/12/2021, 11:51 AM

## 2021-02-12 NOTE — Progress Notes (Signed)
Physical Therapy Treatment Patient Details Name: Dylan [name]. MRN: [id number] DOB: 1940/12/19 Today's Date: 02/12/2021    History of Present Illness 80 yo male with onset of L side weakness was brought to ED, and found no stroke definitive findings yet.  MRI and echo bubble study pending.  PMHx:  dementia, Parkinson's like symptoms, DM, HTN, B12 defic, atherosclerosis, vertebral artery stenosis, pacemaker, complete AV block,    PT Comments    The pt was able to make good progress with activity tolerance this morning, but continues to require mod/maxA to complete all sit-stand transfers and pivot transfers that the pt typically uses to transfer to [facility] at home. The pt was agreeable to session, but requires significantly increased processing time for all cues, commands, and mobility, as well as modA to maintain unsupported sitting balance. The pt was able to complete x4 sit-stand from EOB with mod-maxA and max verbal cues for posture, increased hip/trunk flexion with standing, and to reduce posterior lean in standing. The pt tolerated lateral and anterior-posterior wt shifts well, but required maxA to facilitate wt shifts for pivoting steps to recliner. Recommend continued therapies in in-patient setting, but if family wanting to take pt home, recommend HHPT to facilitate return to prior level of function and decrease caregiver burden.    Follow Up Recommendations  SNF (unless family is able to provide 24/7 supervision and assist with transfers to [facility] If family prefers home, then [place])     Equipment Recommendations  None recommended by PT    Recommendations for Other Services       Precautions / Restrictions Precautions Precautions: Fall Precaution Comments: R side lean in sitting Restrictions Weight Bearing Restrictions: No    Mobility  Bed Mobility Overal bed mobility: Needs Assistance Bed Mobility: Supine to Sit     Supine to sit: Mod assist;HOB elevated     General bed  mobility comments: modA with HOB elevated to complete BLE to EOB, but also modA to trunk to elevate from elevated HOB. modA to steady in sitting due to posterior and R lean, pt unable to maintain BLE on floor or upright position without cues/assist    Transfers Overall transfer level: Needs assistance Equipment used: 1 person hand held assist Transfers: Sit to/from [employer] Transfers Sit to Stand: Max assist;From elevated surface Stand pivot transfers: Max assist       General transfer comment: maxA to power up from sitting EOB, max cues for trunk flexion, max cues and assist to facilitate wt shift forward. increased processing time. Pt holding PT at elbows, needing assist to facilitate hip flexion for sitting. small lateral wt shifts to pivot to recliner. facilitated by PT but pt able to maintain upright  Ambulation/Gait             General Gait Details: very minimal clearance and initiation, but pt able to take small steps with facilitation of wt shift at hips by PT      Balance Overall balance assessment: Needs assistance Sitting-balance support: Bilateral upper extremity supported;Feet supported Sitting balance-Leahy Scale: Poor Sitting balance - Comments: posterior lean requiring minA to maxA for stability Postural control: Posterior lean Standing balance support: Bilateral upper extremity supported Standing balance-Leahy Scale: Poor Standing balance comment: posterior lean, but once stabilized, pt standing upright x1 minute x2 times                            Cognition Arousal/Alertness: Awake/alert Behavior During Therapy:  Flat affect Overall Cognitive Status: History of cognitive impairments - at baseline                                 General Comments: Pt answering simple yes/no questions. requires significant processing time and repeated cues at times to initiate movement to follow commands. Unable to make postural/balance  adjustments without significant cues.      Exercises Other Exercises Other Exercises: lateral leans to L while sitting EOB. max cues and modA to position. improved midline position post exercise. Other Exercises: sit-stand from EOB x4    General Comments        Pertinent Vitals/Pain Pain Assessment: No/denies pain           PT Goals (current goals can now be found in the care plan section) Acute Rehab PT Goals Patient Stated Goal: to get home PT Goal Formulation: With patient Time For Goal Achievement: 02/24/21 Potential to Achieve Goals: Good Progress towards PT goals: Progressing toward goals    Frequency    Min 3X/week      PT Plan Current plan remains appropriate       AM-PAC PT "6 Clicks" Mobility   Outcome Measure  Help needed turning from your back to your side while in a flat bed without using bedrails?: A Lot Help needed moving from lying on your back to sitting on the side of a flat bed without using bedrails?: A Lot Help needed moving to and from a bed to a chair (including a wheelchair)?: A Lot Help needed standing up from a chair using your arms (e.g., wheelchair or bedside chair)?: A Lot Help needed to walk in hospital room?: Total Help needed climbing 3-5 steps with a railing? : Total 6 Click Score: 10    End of Session Equipment Utilized During Treatment: Gait belt Activity Tolerance: Patient tolerated treatment well Patient left: in chair;with call bell/phone within reach;with chair alarm set Nurse Communication: Mobility status PT Visit Diagnosis: Muscle weakness (generalized) (M62.81);Other abnormalities of gait and mobility (R26.89)     Time: 7262-0355 PT Time Calculation (min) (ACUTE ONLY): 28 min  Charges:  $Therapeutic Exercise: 8-22 mins $Neuromuscular Re-education: 8-22 mins                     Karma Ganja, PT, DPT   Acute Rehabilitation Department Pager #: [phone]   Otho Bellows 02/12/2021, 10:21 AM

## 2021-02-14 ENCOUNTER — Emergency Department (HOSPITAL_COMMUNITY)
Admission: EM | Admit: 2021-02-14 | Discharge: 2021-02-18 | Disposition: A | Discharge status: Home / Self Care | Payer: Medicare HMO | Attending: Emergency Medicine | Admitting: Emergency Medicine

## 2021-02-14 ENCOUNTER — Emergency Department (HOSPITAL_COMMUNITY): Payer: Medicare HMO

## 2021-02-14 ENCOUNTER — Other Ambulatory Visit: Payer: Self-pay

## 2021-02-14 ENCOUNTER — Encounter (HOSPITAL_COMMUNITY): Payer: Self-pay | Admitting: Emergency Medicine

## 2021-02-14 DIAGNOSIS — Z87891 Personal history of nicotine dependence: Secondary | ICD-10-CM | POA: Insufficient documentation

## 2021-02-14 DIAGNOSIS — Z20822 Contact with and (suspected) exposure to covid-19: Secondary | ICD-10-CM | POA: Insufficient documentation

## 2021-02-14 DIAGNOSIS — W19XXXA Unspecified fall, initial encounter: Secondary | ICD-10-CM

## 2021-02-14 DIAGNOSIS — F0390 Unspecified dementia without behavioral disturbance: Secondary | ICD-10-CM | POA: Diagnosis present

## 2021-02-14 DIAGNOSIS — E1122 Type 2 diabetes mellitus with diabetic chronic kidney disease: Secondary | ICD-10-CM | POA: Insufficient documentation

## 2021-02-14 DIAGNOSIS — Z950 Presence of cardiac pacemaker: Secondary | ICD-10-CM | POA: Insufficient documentation

## 2021-02-14 DIAGNOSIS — I129 Hypertensive chronic kidney disease with stage 1 through stage 4 chronic kidney disease, or unspecified chronic kidney disease: Secondary | ICD-10-CM | POA: Diagnosis not present

## 2021-02-14 DIAGNOSIS — E1151 Type 2 diabetes mellitus with diabetic peripheral angiopathy without gangrene: Secondary | ICD-10-CM | POA: Insufficient documentation

## 2021-02-14 DIAGNOSIS — N1831 Chronic kidney disease, stage 3a: Secondary | ICD-10-CM | POA: Diagnosis not present

## 2021-02-14 DIAGNOSIS — M25512 Pain in left shoulder: Secondary | ICD-10-CM | POA: Insufficient documentation

## 2021-02-14 DIAGNOSIS — E860 Dehydration: Secondary | ICD-10-CM

## 2021-02-14 DIAGNOSIS — Z7901 Long term (current) use of anticoagulants: Secondary | ICD-10-CM | POA: Diagnosis not present

## 2021-02-14 DIAGNOSIS — Y9301 Activity, walking, marching and hiking: Secondary | ICD-10-CM | POA: Diagnosis not present

## 2021-02-14 DIAGNOSIS — I4891 Unspecified atrial fibrillation: Secondary | ICD-10-CM | POA: Insufficient documentation

## 2021-02-14 DIAGNOSIS — Z79899 Other long term (current) drug therapy: Secondary | ICD-10-CM | POA: Insufficient documentation

## 2021-02-14 DIAGNOSIS — Z7984 Long term (current) use of oral hypoglycemic drugs: Secondary | ICD-10-CM | POA: Insufficient documentation

## 2021-02-14 DIAGNOSIS — M79632 Pain in left forearm: Secondary | ICD-10-CM | POA: Insufficient documentation

## 2021-02-14 DIAGNOSIS — G20 Parkinson's disease: Secondary | ICD-10-CM | POA: Insufficient documentation

## 2021-02-14 DIAGNOSIS — R55 Syncope and collapse: Secondary | ICD-10-CM | POA: Insufficient documentation

## 2021-02-14 DIAGNOSIS — R109 Unspecified abdominal pain: Secondary | ICD-10-CM | POA: Diagnosis not present

## 2021-02-14 DIAGNOSIS — S2242XA Multiple fractures of ribs, left side, initial encounter for closed fracture: Secondary | ICD-10-CM

## 2021-02-14 LAB — CBC WITH DIFFERENTIAL/PLATELET
Abs Immature Granulocytes: 0.05 10*3/uL (ref 0.00–0.07)
Basophils Absolute: 0 10*3/uL (ref 0.0–0.1)
Basophils Relative: 1 %
Eosinophils Absolute: 0.1 10*3/uL (ref 0.0–0.5)
Eosinophils Relative: 1 %
HCT: 39.8 % (ref 39.0–52.0)
Hemoglobin: 12.9 g/dL — ABNORMAL LOW (ref 13.0–17.0)
Immature Granulocytes: 1 %
Lymphocytes Relative: 15 %
Lymphs Abs: 1.2 10*3/uL (ref 0.7–4.0)
MCH: 28.5 pg (ref 26.0–34.0)
MCHC: 32.4 g/dL (ref 30.0–36.0)
MCV: 88.1 fL (ref 80.0–100.0)
Monocytes Absolute: 1.2 10*3/uL — ABNORMAL HIGH (ref 0.1–1.0)
Monocytes Relative: 15 %
Neutro Abs: 5.3 10*3/uL (ref 1.7–7.7)
Neutrophils Relative %: 67 %
Platelets: 179 10*3/uL (ref 150–400)
RBC: 4.52 MIL/uL (ref 4.22–5.81)
RDW: 12.6 % (ref 11.5–15.5)
WBC: 7.9 10*3/uL (ref 4.0–10.5)
nRBC: 0 % (ref 0.0–0.2)

## 2021-02-14 LAB — COMPREHENSIVE METABOLIC PANEL
ALT: 6 U/L (ref 0–44)
AST: 24 U/L (ref 15–41)
Albumin: 3.8 g/dL (ref 3.5–5.0)
Alkaline Phosphatase: 88 U/L (ref 38–126)
Anion gap: 9 (ref 5–15)
BUN: 38 mg/dL — ABNORMAL HIGH (ref 8–23)
CO2: 23 mmol/L (ref 22–32)
Calcium: 9.2 mg/dL (ref 8.9–10.3)
Chloride: 103 mmol/L (ref 98–111)
Creatinine, Ser: 1.42 mg/dL — ABNORMAL HIGH (ref 0.61–1.24)
GFR, Estimated: 50 mL/min — ABNORMAL LOW (ref >60)
Glucose, Bld: 204 mg/dL — ABNORMAL HIGH (ref 70–99)
Potassium: 4.4 mmol/L (ref 3.5–5.1)
Sodium: 135 mmol/L (ref 135–145)
Total Bilirubin: 0.6 mg/dL (ref 0.3–1.2)
Total Protein: 7 g/dL (ref 6.5–8.1)

## 2021-02-14 MED ORDER — RIVASTIGMINE 4.6 MG/24HR TD PT24
4.6000 mg | MEDICATED_PATCH | Freq: Every evening | TRANSDERMAL | Status: DC
  Administered 2021-02-14 – 2021-02-18 (×5): 4.6 mg via TRANSDERMAL
  Filled 2021-02-14 (×5): qty 1

## 2021-02-14 MED ORDER — APIXABAN 5 MG PO TABS
5.0000 mg | ORAL_TABLET | Freq: Two times a day (BID) | ORAL | Status: DC
  Administered 2021-02-14 – 2021-02-18 (×8): 5 mg via ORAL
  Filled 2021-02-14 (×10): qty 1

## 2021-02-14 MED ORDER — PRAVASTATIN SODIUM 20 MG PO TABS
20.0000 mg | ORAL_TABLET | Freq: Every day | ORAL | Status: DC
  Administered 2021-02-14 – 2021-02-18 (×5): 20 mg via ORAL
  Filled 2021-02-14 (×6): qty 1

## 2021-02-14 MED ORDER — METFORMIN HCL 500 MG PO TABS: 1000.0000 mg | ORAL_TABLET | Freq: Every day | ORAL | Status: DC

## 2021-02-14 MED ORDER — METFORMIN HCL 500 MG PO TABS
1000.0000 mg | ORAL_TABLET | Freq: Every day | ORAL | Status: DC
  Administered 2021-02-17 – 2021-02-18 (×2): 1000 mg via ORAL
  Filled 2021-02-14 (×2): qty 2

## 2021-02-14 MED ORDER — CARVEDILOL 12.5 MG PO TABS
12.5000 mg | ORAL_TABLET | Freq: Two times a day (BID) | ORAL | Status: DC
  Administered 2021-02-14 – 2021-02-18 (×9): 12.5 mg via ORAL
  Filled 2021-02-14 (×8): qty 1

## 2021-02-14 MED ORDER — IOHEXOL 300 MG/ML  SOLN
100.0000 mL | Freq: Once | INTRAMUSCULAR | Status: AC | PRN
  Administered 2021-02-14: 100 mL via INTRAVENOUS

## 2021-02-14 MED ORDER — CARBIDOPA-LEVODOPA 25-100 MG PO TABS
2.0000 | ORAL_TABLET | Freq: Three times a day (TID) | ORAL | Status: DC
  Administered 2021-02-14 – 2021-02-18 (×12): 2 via ORAL
  Filled 2021-02-14 (×14): qty 2

## 2021-02-14 MED ORDER — MEMANTINE HCL 5 MG PO TABS
10.0000 mg | ORAL_TABLET | Freq: Two times a day (BID) | ORAL | Status: DC
  Administered 2021-02-14 – 2021-02-18 (×8): 10 mg via ORAL
  Filled 2021-02-14 (×8): qty 2

## 2021-02-14 MED ORDER — SODIUM CHLORIDE 0.9 % IV SOLN: INTRAVENOUS | Status: DC

## 2021-02-14 MED ORDER — SODIUM CHLORIDE 0.9 % IV BOLUS
500.0000 mL | Freq: Once | INTRAVENOUS | Status: AC
  Administered 2021-02-14: 500 mL via INTRAVENOUS

## 2021-02-14 NOTE — ED Triage Notes (Signed)
Pt arrived via EMS from home. Pt has had multiple falls recently. Pt fell yesterday while walking with his walker. Pt has had a gradual decline per EMS. Pt has hx of Parkinson's, dementia. Pt is disoriented at baseline. Pt declined transport yesterday, but today has been complaining of left flank, shoulder, and forearm pain.

## 2021-02-14 NOTE — ED Provider Notes (Addendum)
[facility] DEPT Provider Note   CSN: [id number] Arrival date & time: 02/14/21  1513     History Chief Complaint  Patient presents with  . Fall         Dylan [name]. is a 80 y.o. male.  Patient recently admitted to the hospital April 12th through the 15th.  During that admission had new onset atrial fib started on Eliquis.  Also had work-up for stroke which was negative.  Probably had vasovagal syncope.  Has history of Parkinson disease diabetes.  Has complete heart block with a pacemaker.  Has chronic kidney disease stage II.  Patient's had multiple falls at home.  EMS was out to the house but they decided not to transport him in yesterday.  Today patient's wife had him transported in.  In consultation with her she is stating that she is not able to care for him at home and he needs to go to rehab or nursing facility.  Patient had complaint of left flank pain left shoulder pain and forearm pain.  And hip pain.  Patient's neuro exam and mental status is baseline.  He does have a history of dementia.        Past Medical History:  Diagnosis Date  . Anxiety   . Aortic stenosis    mild AS 11/24/16 (peak grad 24, mean grad 11) Dr. [name]  . CHB (complete heart block) ([place]) 07/2017  . 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[place]) 07/11/2017  . Diabetes mellitus without complication ([place])   . Encounter for care of pacemaker 07/11/2020  . Gait abnormality   . Gout   . Hyperlipemia   . Hypertension   . 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 Memory loss   . Pacemaker Dual chamber [place] MRI  model [id number] 07/11/2020 06/08/2020  . Peripheral arterial disease ([place])   . 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 Second degree AV block    Wenckebach; no indication for pacemaker as of 12/01/16 (Dr. [name])  . Vitamin B12 deficiency anemia 03/01/2018  . Vitamin D deficiency disease      Patient Active Problem List   Diagnosis Date Noted  . TIA (transient ischemic attack) 02/10/2021  . Acute encephalopathy 02/10/2021  . Chronic constipation 12/24/2020  . Parkinson's disease ([place]) 12/24/2020  . Encounter for care of pacemaker 07/11/2020  . Pacemaker Dual chamber [place] MRI  model [id number] 07/11/2020 06/08/2020  . Neurogenic orthostatic hypotension ([place]) 06/08/2020  . DNR (do not resuscitate) 06/08/2020  . Elevated CK 04/27/2020  . Gout   . Hyperlipemia   . Parkinsonism ([place]) 01/29/2020  . 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 Dementia with behavioral disturbance ([place]) 05/22/2019  . Gait abnormality 05/22/2019  . AKI (acute kidney injury) ([place]) 07/14/2018  . Nephropathy 07/14/2018  . Other disorders of lung 07/14/2018  . Atherosclerosis of aorta ([place]) 07/14/2018  . Dehydration 07/14/2018  . 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 CKD (chronic kidney disease), stage III ([place]) 06/13/2018  . Syncope 06/13/2018  . 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[place]) 07/11/2017  . 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[place]) 06/23/2015  . Peripheral arterial disease ([place]) 05/13/2014  . Essential hypertension 05/13/2014  . Type 2 diabetes mellitus with stage 3a chronic kidney disease, without long-term current use of insulin ([place]) 05/13/2014    Past Surgical History:  Procedure Laterality Date  . CARDIOVASCULAR STRESS TEST     11/21/16  Low risk study, EF 52% [facility])  . EYE SURGERY    . KYPHOPLASTY N/A 02/15/2017   Procedure: LUMBAR FOUR KYPHOPLASTY;  Surgeon: Phylliss Bob, MD;  Location: [facility];  Service: Orthopedics;  Laterality: N/A;  . lipoma surgery     neck - 30 years ago  . PACEMAKER IMPLANT N/A 07/11/2017   Procedure: Pacemaker Implant;  Surgeon: Constance Haw, MD;  Location: [facility];  Service: Cardiovascular;  Laterality: N/A;  . TRANSTHORACIC ECHOCARDIOGRAM     11/24/16 [facility] CV): EF 55-60%, mild AS, mild-mod MR, mod TR,  moderate pulm HTN, PAP 49 mmHg       Family History  Problem Relation Age of Onset  . Diabetes Mother   . Breast cancer Mother   . Arthritis Mother   . Hypertension Father   . Diabetes Sister   . Diabetes Brother   . Diabetes Maternal Grandmother   . Diabetes Brother   . Diabetes Brother   . Diabetes Brother   . Diabetes Sister   . Diabetes Sister   . Diabetes Sister     Social History   Tobacco Use  . Smoking status: Former Smoker    Packs/day: 0.50    Years: 7.00    Pack years: 3.50    Types: Cigarettes  . Smokeless tobacco: Never Used  . Tobacco comment: quit 35 years  Vaping Use  . Vaping Use: Never used  Substance Use Topics  . Alcohol use: No  . Drug use: No    Home Medications Prior to Admission medications   Medication Sig Start Date End Date Taking? Authorizing Provider  apixaban (ELIQUIS) 5 MG TABS tablet Take 1 tablet (5 mg total) by mouth 2 (two) times daily. 02/11/21   Danford, Suann Larry, MD  Ascorbic Acid (VITAMIN C) 1000 MG tablet Take 1,000 mg by mouth every morning.     [provider]  carbidopa-levodopa (SINEMET IR) 25-100 MG tablet Take 2 tablets by mouth 3 (three) times daily. Patient taking differently: Take 1 tablet by mouth See admin instructions. Take 1 tablet by mouth at 10 AM and 7 PM 12/24/20   Marcial Pacas, MD  carvedilol (COREG) 12.5 MG tablet TAKE 1 TABLET BY MOUTH 2 TIMES DAILY. Patient taking differently: Take 25 mg by mouth 2 (two) times daily with a meal. 07/07/20   [name], MD  Cholecalciferol (VITAMIN D3) 5000 units CAPS Take 5,000 Units by mouth at bedtime.    [provider]  desonide (DESOWEN) 0.05 % lotion Apply topically 2 times per day prn Patient taking differently: Apply 1 application topically 2 (two) times daily as needed (for irritation- to affected areas of the face). 06/17/20   [name], MD  diclofenac Sodium (VOLTAREN) 1 % GEL Apply 2 g topically 3 (three) times daily. Patient taking  differently: Apply 2 g topically as needed (to painful sites). 10/10/19   Rodriguez-Southworth, Sunday Spillers, PA-C  glucose blood (ONETOUCH VERIO) test strip Use as instructed to check blood sugars daily dx: e11.22 01/06/20   [name], MD  Glycerin (OASIS TEARS PF OP) Place 1 drop into both eyes in the morning.    [provider]  Lancet Devices (ONE TOUCH DELICA LANCING DEV) MISC Please fill ONE TOUCH DELICA LANCING DEVICE.  Use to test blood sugar twice daily as directed. E11.65 Patient taking differently: Please fill ONE TOUCH DELICA LANCING DEVICE.  Use to test blood sugar twice daily as directed. E11.65 11/15/19   [name], MD  Lancets MISC Please fill basic/generic push button lancets.  Patient to test blood sugar twice daily. DX: E11.65 12/06/19   [name], MD  linaclotide Rolan Lipa) 72 MCG capsule Take 1 capsule (72 mcg total) by mouth daily before breakfast. Patient not taking: No sig reported 12/19/20   [name], MD  Melatonin 10 MG TABS Take 10-20 mg by mouth at bedtime as needed (for sleep).    [provider]  memantine (NAMENDA) 10 MG tablet TAKE 1 TABLET BY MOUTH TWICE A DAY Patient taking differently: Take 10 mg by mouth 2 (two) times daily. 07/07/20   [name], MD  metFORMIN (GLUCOPHAGE) 1000 MG tablet Take 1 tablet by mouth daily Patient taking differently: Take 1,000 mg by mouth daily with breakfast. 12/24/20   [name], MD  pravastatin (PRAVACHOL) 20 MG tablet TAKE 1 TABLET BY ORAL ROUTE EVERY DAY Patient taking differently: Take 20 mg by mouth daily. 12/01/20   [name], MD  QUEtiapine (SEROQUEL) 25 MG tablet TAKE 1 TABLET (25 MG TOTAL) BY MOUTH AT BEDTIME AS NEEDED. 08/21/20   Marcial Pacas, MD  rivastigmine (EXELON) 4.6 mg/24hr Place 1 patch (4.6 mg total) onto the skin daily. Patient taking differently: Place 4.6 mg onto the skin every evening. 10/26/20   Suzzanne Cloud, NP  senna-docusate (SENOKOT-S) 8.6-50 MG tablet Take 1 tablet by  mouth every other day.    [provider]  TRULICITY 1.5 OZ/3.6UY SOPN INJECT 1.5 MG INTO THE SKIN ONCE A WEEK. Patient taking differently: No sig reported 09/07/20   [name], MD    Allergies    Quetiapine  Review of Systems   Review of Systems  Unable to perform ROS: Dementia    Physical Exam Updated Vital Signs BP (!) 159/76   Pulse 95   Temp 98.2 F (36.8 C) (Oral)   Resp (!) 28   Ht 1.676 m (5\' 6" )   Wt 74 kg   SpO2 98%   BMI 26.33 kg/m   Physical Exam Vitals and nursing note reviewed.  Constitutional:      Appearance: Normal appearance. He is well-developed.  HENT:     Head: Normocephalic and atraumatic.  Eyes:     Extraocular Movements: Extraocular movements intact.     Conjunctiva/sclera: Conjunctivae normal.     Pupils: Pupils are equal, round, and reactive to light.  Cardiovascular:     Rate and Rhythm: Normal rate and regular rhythm.     Heart sounds: No murmur heard.   Pulmonary:     Effort: Pulmonary effort is normal. No respiratory distress.     Breath sounds: Normal breath sounds.  Abdominal:     Palpations: Abdomen is soft.     Tenderness: There is no abdominal tenderness.  Musculoskeletal:        General: No swelling. Normal range of motion.     Cervical back: Normal range of motion and neck supple.  Skin:    General: Skin is warm and dry.     Capillary Refill: Capillary refill takes less than 2 seconds.  Neurological:     Mental Status: He is alert. Mental status is at baseline.     Comments: Contraction of left hand.     ED Results / Procedures / Treatments   Labs (all labs ordered are listed, but only abnormal results are displayed) Labs Reviewed  COMPREHENSIVE METABOLIC PANEL - Abnormal; Notable for the following components:      Result Value   Glucose, Bld 204 (*)  BUN 38 (*)    Creatinine, Ser 1.42 (*)    GFR, Estimated 50 (*)    All other components within normal limits  CBC WITH DIFFERENTIAL/PLATELET -  Abnormal; Notable for the following components:   Hemoglobin 12.9 (*)    Monocytes Absolute 1.2 (*)    All other components within normal limits    EKG EKG Interpretation  Date/Time:  Sunday February 14 2021 15:41:53 EDT Ventricular Rate:  71 PR Interval:  151 QRS Duration: 146 QT Interval:  465 QTC Calculation: 506 R Axis:   -30 Text Interpretation: Sinus rhythm Atrial premature complexes Left bundle branch block No significant change since last tracing Confirmed by Fredia Sorrow [phone]) on 02/14/2021 4:04:12 PM   Radiology DG Forearm Left  Result Date: 02/14/2021 CLINICAL DATA:  Multiple recent falls. EXAM: LEFT FOREARM - 2 VIEW COMPARISON:  12/03/2019 and 12/27/2019 views of the LEFT hand FINDINGS: Bones appear osteopenic. There is an moderate olecranon osteophyte. No acute fracture or subluxation. Minimal irregularity along the radial aspect of the distal radius shows long-term stability. There is no evidence for acute fracture or subluxation. There small vessel atherosclerotic calcification. IMPRESSION: No evidence for acute abnormality. Electronically Signed   By: Nolon Nations M.D.   On: 02/14/2021 17:06   DG Chest Port 1 View  Result Date: 02/14/2021 CLINICAL DATA:  Fall.  Multiple recent falls.  Gradual decline. EXAM: PORTABLE CHEST 1 VIEW COMPARISON:  10/07/2020 FINDINGS: LEFT-sided transvenous pacemaker with leads to the RIGHT atrium and RIGHT ventricle. Heart size is normal. Lungs are free of focal consolidations and pleural effusions. No pulmonary edema. IMPRESSION: No active disease. Electronically Signed   By: Nolon Nations M.D.   On: 02/14/2021 17:02   DG Shoulder Left  Result Date: 02/14/2021 CLINICAL DATA:  Fall.  Multiple recent falls. EXAM: LEFT SHOULDER - 2+ VIEW COMPARISON:  None. FINDINGS: There is mild subacromial narrowing. No acute fracture or subluxation. LEFT lung apex is unremarkable. IMPRESSION: No evidence for acute abnormality. Electronically Signed    By: Nolon Nations M.D.   On: 02/14/2021 17:03    Procedures Procedures   Medications Ordered in ED Medications  0.9 %  sodium chloride infusion ( Intravenous New Bag/Given 02/14/21 1821)  iohexol (OMNIPAQUE) 300 MG/ML solution 100 mL (100 mLs Intravenous Contrast Given 02/14/21 1742)  sodium chloride 0.9 % bolus 500 mL (500 mLs Intravenous New Bag/Given 02/14/21 1821)    ED Course  I have reviewed the triage vital signs and the nursing notes.  Pertinent labs & imaging results that were available during my care of the patient were reviewed by me and considered in my medical decision making (see chart for details).    MDM Rules/Calculators/A&P                          Patient's wife is here now.  She feels that he needs to go to a nursing facility or rehab facility.  She is not able to care for him at home.  Patient was just discharged on 15 April.  Patient's had multiple falls.  They arrange for home health care but patient's wife is not able to care for him without full-time assistance.  Patient's mental status is reported by her to be baseline.  Patient did complain of left shoulder pain left forearm pain and some hip pain.  Chest x-ray negative left shoulder negative left forearm negative.  CT head neck and CT abdomen pelvis was ordered to  take a look at the lumbar spine as well as the hips and pelvis and then also patient had complained of abdominal pain.  Labs here today are significant for elevated BUN and creatinine increased suggestive of some dehydration.  Patient receiving some IV fluids.  But this is not significant enough to warrant admission.  If the rest of the work-up does not show an indication for admission then patient will be consulted with social worker case management and physical therapy for nursing home placement.  CT scans of head neck abdomen and pelvis with the finding of left fifth and sixth rib fracture.  And discussed with radiologist may be a very very  small pneumothorax.  This probably explains the left flank pain.  No back or hip or pelvis bony injury.  We will go ahead and place consults into social worker care management and physical therapy they most likely will not be seeing him until tomorrow in order to make arrangements for possible nursing home placement.   Final Clinical Impression(s) / ED Diagnoses Final diagnoses:  Fall, initial encounter  Dementia without behavioral disturbance, unspecified dementia type ([place])  Dehydration    Rx / DC Orders ED Discharge Orders    None       Fredia Sorrow, MD 02/14/21 Ward Chatters    Fredia Sorrow, MD 02/14/21 1920

## 2021-02-15 ENCOUNTER — Telehealth: Payer: Self-pay

## 2021-02-15 LAB — CBG MONITORING, ED: Glucose-Capillary: 149 mg/dL — ABNORMAL HIGH (ref 70–99)

## 2021-02-15 NOTE — Progress Notes (Signed)
[facility]) Hospital Liaison note:  This patient is a currently enrolled in [facility] outpatient-based Palliative Care. Will continue to follow for disposition.   Please call with any outpatient palliative questions or concerns.  Thank you, Lorelee Market, LPN [facility] Liaison [phone]

## 2021-02-15 NOTE — Progress Notes (Signed)
TOC CSW PASSAR was started.  Information requested was faxed.  CSW will continue to follow for dc needs.  Nanako Stopher Tarpley-Carter, MSW, LCSW-A Pronouns:  She, Her, Hers                  [facility] ED Transitions of CareClinical Social Worker [email or web address] [phone]

## 2021-02-15 NOTE — Evaluation (Signed)
Physical Therapy Evaluation Patient Details Name: Dylan [name]. MRN: [id number] DOB: Feb 15, 1941 Today's Date: 02/15/2021   History of Present Illness  80 yo male admitted to ED-wife unable to care for pt at home. Pt alos found to have L rib fractures and small L pneumothorax. Hx of Afib, Parkinson's, dementia, Dm, pacemaker, CKD, falls, gout, aortic stenosis.  Clinical Impression  On eval in ED, pt required Mod A +2 for safe mobility. He was able to stand and take a few side steps along the bedside with a RW. Multimodal cueing and increased time required for mobility tasks. Pt is at high risk for falls when mobilizing. Bradykinesia and slow processing observed during session. No family present at time of eval. Per chart, wife is unable to provide current level of care. Will recommend SNF for continued rehab. Will continue to follow and progress activity as tolerated.     Follow Up Recommendations SNF    Equipment Recommendations  None recommended by PT    Recommendations for Other Services       Precautions / Restrictions Precautions Precautions: Fall Restrictions Weight Bearing Restrictions: No      Mobility  Bed Mobility Overal bed mobility: Needs Assistance Bed Mobility: Supine to Sit;Sit to Supine     Supine to sit: Mod assist;+2 for physical assistance;+2 for safety/equipment;HOB elevated Sit to supine: Mod assist;+2 for physical assistance;+2 for safety/equipment;HOB elevated   General bed mobility comments: Multimodal cueing and increased time required. Assist for trunk and bil LEs.    Transfers Overall transfer level: Needs assistance Equipment used: Rolling walker (2 wheeled) Transfers: Sit to/from Stand Sit to Stand: Mod assist;+2 physical assistance;+2 safety/equipment;From elevated surface         General transfer comment: A to shift weight anteriorly, position bil LEs, power up, stabilize, control descent. Poor anterior weightshifting during sit<>stand  transitions. Multimodal cueing required. High fall risk.  Ambulation/Gait Ambulation/Gait assistance: Mod assist;+2 physical assistance;+2 safety/equipment   Assistive device: Rolling walker (2 wheeled)       General Gait Details: Side steps along bedside with RW. A to stabilize pt, manage RW, encourage/guid movement. Multimodal cueing required. Increased time. High fall risk.  Stairs            Wheelchair Mobility    Modified Rankin (Stroke Patients Only)       Balance Overall balance assessment: Needs assistance;History of Falls         Standing balance support: Bilateral upper extremity supported Standing balance-Leahy Scale: Poor                               Pertinent Vitals/Pain Pain Assessment: No/denies pain    Home Living Family/patient expects to be discharged to:: Unsure Living Arrangements: Spouse/significant other Available Help at Discharge: Family;Available 24 hours/day Type of Home: House Home Access: Stairs to enter              Prior Function Level of Independence: Needs assistance   Gait / Transfers Assistance Needed: Wife or daughter assists pt into w/c  ADL's / Homemaking Assistance Needed: A for ADLs        Hand Dominance        Extremity/Trunk Assessment   Upper Extremity Assessment Upper Extremity Assessment: Generalized weakness    Lower Extremity Assessment Lower Extremity Assessment: Generalized weakness (stiffness noted)    Cervical / Trunk Assessment Cervical / Trunk Assessment: Kyphotic  Communication   Communication: Expressive difficulties  Cognition Arousal/Alertness: Awake/alert Behavior During Therapy: Flat affect Overall Cognitive Status: History of cognitive impairments - at baseline                                 General Comments: Pt answering simple yes/no questions. requires significant processing time and repeated cues at times to initiate movement to follow commands.  Unable to make postural/balance adjustments without significant cues.      General Comments      Exercises     Assessment/Plan    PT Assessment Patient needs continued PT services  PT Problem List Decreased strength;Decreased mobility;Decreased range of motion;Decreased activity tolerance;Decreased balance;Decreased knowledge of use of DME;Decreased cognition       PT Treatment Interventions DME instruction;Gait training;Therapeutic activities;Therapeutic exercise;Patient/family education;Balance training;Functional mobility training;Neuromuscular re-education    PT Goals (Current goals can be found in the Care Plan section)  Acute Rehab PT Goals Patient Stated Goal: none stated during session. Per chart, wife requesting SNF placement PT Goal Formulation: Patient unable to participate in goal setting Time For Goal Achievement: 03/01/21 Potential to Achieve Goals: Fair    Frequency Min 2X/week   Barriers to discharge Decreased caregiver support      Co-evaluation               AM-PAC PT "6 Clicks" Mobility  Outcome Measure Help needed turning from your back to your side while in a flat bed without using bedrails?: Total Help needed moving from lying on your back to sitting on the side of a flat bed without using bedrails?: Total Help needed moving to and from a bed to a chair (including a wheelchair)?: Total Help needed standing up from a chair using your arms (e.g., wheelchair or bedside chair)?: Total Help needed to walk in hospital room?: Total Help needed climbing 3-5 steps with a railing? : Total 6 Click Score: 6    End of Session Equipment Utilized During Treatment: Gait belt Activity Tolerance: Patient tolerated treatment well Patient left: in bed;with call bell/phone within reach;with bed alarm set   PT Visit Diagnosis: Muscle weakness (generalized) (M62.81);History of falling (Z91.81);Other symptoms and signs involving the nervous system (R29.898)     Time: 9826-4158 PT Time Calculation (min) (ACUTE ONLY): 17 min   Charges:   PT Evaluation $PT Eval Moderate Complexity: 1 Mod             Doreatha Massed, PT Acute Rehabilitation  Office: [phone] Pager: [phone]

## 2021-02-15 NOTE — Telephone Encounter (Signed)
I called to do a transition of care call and to schedule an appt for a hospital f/u.  The pt fell and had to go the hospital on yesterday, [name] [name] said they kept the pt because he has 2 or 3 fractured ribs.

## 2021-02-15 NOTE — ED Notes (Signed)
PT at bedside.

## 2021-02-15 NOTE — ED Notes (Signed)
Pt given meal tray.

## 2021-02-15 NOTE — ED Notes (Signed)
Pt given morning medications, taken PO without issue. Repositioned in bed.

## 2021-02-15 NOTE — ED Notes (Signed)
Family updated on POC

## 2021-02-15 NOTE — ED Notes (Signed)
Assumed care of pt at this time. Pt resting in stretcher, no s/sx of acute distress at this time.  

## 2021-02-16 MED ORDER — ACETAMINOPHEN 325 MG PO TABS
650.0000 mg | ORAL_TABLET | Freq: Once | ORAL | Status: AC
  Administered 2021-02-16: 650 mg via ORAL
  Filled 2021-02-16: qty 2

## 2021-02-16 NOTE — ED Provider Notes (Signed)
Emergency Medicine Observation Re-evaluation Note  Dylan [name]. is a 80 y.o. male, seen on rounds today.  Pt initially presented to the ED for complaints of Fall Currently, the patient is in bed, awake, non-communicative.  Physical Exam  BP 140/65   Pulse 71   Temp 98.2 F (36.8 C) (Oral)   Resp (!) 22   Ht 5\' 6"  (1.676 m)   Wt 74 kg   SpO2 97%   BMI 26.33 kg/m  Physical Exam General: nad Lungs: no distress Psych: calm  ED Course / MDM  EKG:EKG Interpretation  Date/Time:  Sunday February 14 2021 15:41:53 EDT Ventricular Rate:  71 PR Interval:  151 QRS Duration: 146 QT Interval:  465 QTC Calculation: 506 R Axis:   -30 Text Interpretation: Sinus rhythm Atrial premature complexes Left bundle branch block No significant change since last tracing Confirmed by Fredia Sorrow [phone]) on 02/14/2021 4:04:12 PM   I have reviewed the labs performed to date as well as medications administered while in observation.  Recent changes in the last 24 hours include none.  Plan  Current plan is for SNF placement. Patient is not under full IVC at this time.   Arnaldo Natal, MD 02/16/21 [phone]

## 2021-02-16 NOTE — ED Notes (Signed)
Pillow was placed under the left hip for comfort.

## 2021-02-16 NOTE — ED Notes (Signed)
Patient was fed his dinner. Patients sheet, brief, and [employer] were all changed. Patient is dry and resting now.

## 2021-02-16 NOTE — NC FL2 (Signed)
[place] LEVEL OF CARE SCREENING TOOL     IDENTIFICATION  Patient Name: Dylan [name]. Birthdate: 1941-01-20 Sex: male Admission Date (Current Location): 02/14/2021  [facility] and [place] Number:  Herbalist and Address:  [facility],  [place] [address], [place]      Provider Number: [id number]  Attending Physician Name and Address:  Default, Provider, MD  Relative Name and Phone Number:  [name] [phone]    Current Level of Care: Hospital Recommended Level of Care: [place] Prior Approval Number:    Date Approved/Denied:   PASRR Number: Pending  Discharge Plan: SNF    Current Diagnoses: Patient Active Problem List   Diagnosis Date Noted  . TIA (transient ischemic attack) 02/10/2021  . Acute encephalopathy 02/10/2021  . Chronic constipation 12/24/2020  . Parkinson's disease ([place]) 12/24/2020  . Encounter for care of pacemaker 07/11/2020  . 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 Neurogenic orthostatic hypotension ([place]) 06/08/2020  . DNR (do not resuscitate) 06/08/2020  . Elevated CK 04/27/2020  . Gout   . Hyperlipemia   . Parkinsonism ([place]) 01/29/2020  . 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 Dementia with behavioral disturbance ([place]) 05/22/2019  . Gait abnormality 05/22/2019  . AKI (acute kidney injury) ([place]) 07/14/2018  . Nephropathy 07/14/2018  . Other disorders of lung 07/14/2018  . Atherosclerosis of aorta ([place]) 07/14/2018  . Dehydration 07/14/2018  . 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 CKD (chronic kidney disease), stage III ([place]) 06/13/2018  . Syncope 06/13/2018  . 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 Peripheral arterial disease ([place]) 05/13/2014  . Essential hypertension 05/13/2014  . Type 2 diabetes mellitus with stage 3a chronic kidney disease, without  long-term current use of insulin ([place]) 05/13/2014    Orientation RESPIRATION BLADDER Height & Weight     Self,Place  Normal   Weight: 163 lb 2.3 oz (74 kg) Height:  5\' 6"  (167.6 cm)  BEHAVIORAL SYMPTOMS/MOOD NEUROLOGICAL BOWEL NUTRITION STATUS        Diet (Soft)  AMBULATORY STATUS COMMUNICATION OF NEEDS Skin   Limited Assist   Normal                       Personal Care Assistance Level of Assistance  Bathing,Feeding,Dressing Bathing Assistance: Limited assistance Feeding assistance: Independent Dressing Assistance: Limited assistance     Functional Limitations Info  Sight,Hearing,Speech Sight Info: Adequate Hearing Info: Adequate Speech Info: Adequate    SPECIAL CARE FACTORS FREQUENCY  PT (By licensed PT),OT (By licensed OT)     PT Frequency: 5x a week OT Frequency: 5x a week            Contractures Contractures Info: Not present    Additional Factors Info  Code Status,Allergies Code Status Info: Full Allergies Info: Quetiapine           Current Medications (02/16/2021):  This is the current hospital active medication list Current Facility-Administered Medications  Medication Dose Route Frequency Provider Last Rate Last Admin  . 0.9 %  sodium chloride infusion   Intravenous Continuous Fredia Sorrow, MD 100 mL/hr at 02/15/21 2222 New Bag at 02/15/21 2222  . apixaban (ELIQUIS) tablet 5 mg  5 mg Oral BID Fredia Sorrow, MD   5 mg at 02/15/21 2212  . carbidopa-levodopa (SINEMET IR) 25-100 MG per tablet immediate release 2 tablet  2 tablet Oral TID Fredia Sorrow, MD   2 tablet at 02/15/21 2212  . carvedilol (COREG) tablet 12.5 mg  12.5 mg Oral BID Fredia Sorrow, MD   12.5 mg at 02/15/21 2209  . memantine (NAMENDA) tablet 10 mg  10 mg Oral BID Fredia Sorrow, MD   10 mg at 02/15/21 2209  . [START ON 02/17/2021] metFORMIN (GLUCOPHAGE) tablet 1,000 mg  1,000 mg Oral Q breakfast Efraim Kaufmann, RPH      . pravastatin (PRAVACHOL) tablet 20 mg  20 mg  Oral Daily Fredia Sorrow, MD   20 mg at 02/15/21 0917  . rivastigmine (EXELON) 4.6 mg/24hr 4.6 mg  4.6 mg Transdermal QPM Fredia Sorrow, MD   4.6 mg at 02/15/21 1636   Current Outpatient Medications  Medication Sig Dispense Refill  . apixaban (ELIQUIS) 5 MG TABS tablet Take 1 tablet (5 mg total) by mouth 2 (two) times daily. 60 tablet 0  . Ascorbic Acid (VITAMIN C) 1000 MG tablet Take 1,000 mg by mouth every morning.     . carbidopa-levodopa (SINEMET IR) 25-100 MG tablet Take 2 tablets by mouth 3 (three) times daily. (Patient taking differently: Take 1 tablet by mouth See admin instructions. Take 1 tablet by mouth at 10 AM and 7 PM) 180 tablet 11  . carvedilol (COREG) 12.5 MG tablet TAKE 1 TABLET BY MOUTH 2 TIMES DAILY. (Patient taking differently: Take 12.5 mg by mouth 2 (two) times daily with a meal.) 60 tablet 11  . Cholecalciferol (VITAMIN D3) 5000 units CAPS Take 5,000 Units by mouth at bedtime.    [name] desonide (DESOWEN) 0.05 % lotion Apply topically 2 times per day prn (Patient taking differently: Apply 1 application topically 2 (two) times daily as needed (for irritation- to affected areas of the face).) 59 mL 1  . diclofenac Sodium (VOLTAREN) 1 % GEL Apply 2 g topically 3 (three) times daily. (Patient taking differently: Apply 2 g topically as needed (to painful sites).) 150 g 0  . Glycerin (OASIS TEARS PF OP) Place 1 drop into both eyes in the morning.    . Melatonin 10 MG TABS Take 10-20 mg by mouth at bedtime as needed (for sleep).    . memantine (NAMENDA) 10 MG tablet TAKE 1 TABLET BY MOUTH TWICE A DAY (Patient taking differently: Take 10 mg by mouth 2 (two) times daily.) 60 tablet 14  . metFORMIN (GLUCOPHAGE) 1000 MG tablet Take 1 tablet by mouth daily (Patient taking differently: Take 1,000 mg by mouth daily with breakfast.) 90 tablet 1  . pravastatin (PRAVACHOL) 20 MG tablet TAKE 1 TABLET BY ORAL ROUTE EVERY DAY (Patient taking differently: Take 20 mg by mouth daily.) 30 tablet 2   . rivastigmine (EXELON) 4.6 mg/24hr Place 1 patch (4.6 mg total) onto the skin daily. (Patient taking differently: Place 4.6 mg onto the skin every evening.) 30 patch 12  . senna-docusate (SENOKOT-S) 8.6-50 MG tablet Take 1 tablet by mouth every other day.    . TRULICITY 1.5 YK/5.9DJ SOPN INJECT 1.5 MG INTO THE SKIN ONCE A WEEK. (Patient taking differently: Inject 1.5 mg into the skin every Monday.) 12 mL 2  . glucose blood (ONETOUCH VERIO) test strip Use as instructed to check blood sugars daily dx: e11.22 150 each 3  . Lancet Devices (ONE TOUCH DELICA LANCING DEV) MISC Please fill ONE TOUCH DELICA LANCING DEVICE.  Use to test blood sugar twice daily as directed. E11.65 (Patient taking differently: Please fill ONE TOUCH DELICA LANCING DEVICE.  Use  to test blood sugar twice daily as directed. E11.65) 1 each 1  . Lancets MISC Please fill basic/generic push button lancets.  Patient to test blood sugar twice daily. DX: E11.65 100 each 4  . linaclotide (LINZESS) 72 MCG capsule Take 1 capsule (72 mcg total) by mouth daily before breakfast. (Patient not taking: No sig reported) 30 capsule 0  . QUEtiapine (SEROQUEL) 25 MG tablet TAKE 1 TABLET (25 MG TOTAL) BY MOUTH AT BEDTIME AS NEEDED. 90 tablet 1     Discharge Medications: Please see discharge summary for a list of discharge medications.  Relevant Imaging Results:  Relevant Lab Results:   Additional Information SSN#:  [id number]  Tamara Kenyon C Tarpley-Carter, LCSWA

## 2021-02-16 NOTE — ED Notes (Signed)
Patient was given his lunch tray.

## 2021-02-16 NOTE — ED Notes (Signed)
Patients wife would like the nurse to call her with an update. Her name and number are in the demographics.

## 2021-02-16 NOTE — ED Notes (Signed)
Patient is asleep.  

## 2021-02-16 NOTE — ED Notes (Signed)
Pt updated that his evening meds are scheduled for 2200. Pt provided with additional blanket. Call bell within reach.

## 2021-02-16 NOTE — ED Notes (Signed)
Patient is resting in bed.

## 2021-02-16 NOTE — ED Provider Notes (Signed)
The patient is anticipated to require 30 days or less in a skilled rehab facility.   Arnaldo Natal, MD 02/16/21 [phone]

## 2021-02-16 NOTE — Progress Notes (Addendum)
TOC CSW spoke with pts wife/Dylan [name] [phone] in regards to pts home address needed for [place].  [place], [place]  [address]  [name], MSW, LCSW-A Pronouns:  She, Her, Hers                  [name] ED Transitions of CareClinical Social Worker [name] [phone]

## 2021-02-16 NOTE — ED Notes (Signed)
Tylenol given for left shoulder pain.

## 2021-02-16 NOTE — ED Notes (Signed)
Repositioned patient in bed and fluffed sheets. Patient woke up briefly then went back to sleep.

## 2021-02-16 NOTE — ED Notes (Signed)
Walked into room and patient has one of his arms in the air. I asked if he was ok he stated yes. Patient stated he was fine.

## 2021-02-17 NOTE — Progress Notes (Signed)
Physical Therapy Treatment Patient Details Name: Dylan [name]. MRN: [id number] DOB: [birth year] Today's Date: 02/17/2021    History of Present Illness 80 yo male admitted to ED-wife unable to care for pt at home. Pt alos found to have L rib fractures and small L pneumothorax. Hx of Afib, Parkinson's, dementia, Dm, pacemaker, CKD, falls, gout, aortic stenosis.    PT Comments    Patient awake, assisted with AAROM both UE's and LE's. Patient has rigid tone/cogwheeling of BUE's. Patient required max assitance of 1 to sit up on bed edge and stood  With bilateral UE support. Unable to weight shift to take any side steps this visit. Max assisted with 1 person to return to supine. Continue PT for mobility.  Follow Up Recommendations  SNF     Equipment Recommendations  None recommended by PT    Recommendations for Other Services       Precautions / Restrictions Precautions Precautions: Fall    Mobility  Bed Mobility Overal bed mobility: Needs Assistance Bed Mobility: Supine to Sit;Sit to Supine     Supine to sit: Mod assist Sit to supine: Max assist   General bed mobility comments: Multimodal cueing and increased time required. Assist for trunk and bil LEs.    Transfers Overall transfer level: Needs assistance     Sit to Stand: Mod assist         General transfer comment: stood up holding to back of chair. stood x ~ 2 minutes, posterior bias. Unable to take any side steps.  Ambulation/Gait                 Stairs             Wheelchair Mobility    Modified Rankin (Stroke Patients Only)       Balance Overall balance assessment: Needs assistance;History of Falls Sitting-balance support: Bilateral upper extremity supported;Feet supported Sitting balance-Leahy Scale: Poor Sitting balance - Comments: posterior lean requiring minA to maxA for stability Postural control: Posterior lean   Standing balance-Leahy Scale: Poor Standing balance comment:  posterior lean, but once stabilized, pt standing upright x2 times                            Cognition Arousal/Alertness: Awake/alert Behavior During Therapy: Flat affect Overall Cognitive Status: History of cognitive impairments - at baseline                                 General Comments: oriented to WL, slow responses and processing time      Exercises      General Comments        Pertinent Vitals/Pain Pain Assessment: No/denies pain    Home Living                      Prior Function            PT Goals (current goals can now be found in the care plan section) Progress towards PT goals: Progressing toward goals    Frequency    Min 2X/week      PT Plan Current plan remains appropriate    Co-evaluation              AM-PAC PT "6 Clicks" Mobility   Outcome Measure  Help needed turning from your back to your side while in a flat bed without using bedrails?:  Total Help needed moving from lying on your back to sitting on the side of a flat bed without using bedrails?: Total Help needed moving to and from a bed to a chair (including a wheelchair)?: Total Help needed standing up from a chair using your arms (e.g., wheelchair or bedside chair)?: Total Help needed to walk in hospital room?: Total Help needed climbing 3-5 steps with a railing? : Total 6 Click Score: 6    End of Session Equipment Utilized During Treatment: Gait belt Activity Tolerance: Patient tolerated treatment well Patient left: in bed;with call bell/phone within reach;with bed alarm set Nurse Communication: Mobility status PT Visit Diagnosis: Muscle weakness (generalized) (M62.81);History of falling (Z91.81);Other symptoms and signs involving the nervous system (F11.021)     Time: 1173-5670 PT Time Calculation (min) (ACUTE ONLY): 17 min  Charges:  $Therapeutic Activity: 8-22 mins                     Tresa Endo PT Acute Rehabilitation  Services Pager [phone] Office [phone]    Claretha Cooper 02/17/2021, 9:28 AM

## 2021-02-17 NOTE — TOC Progression Note (Signed)
Transition of Care (TOC) - Progression Note    Patient Details  Name: Dylan [name]. MRN: [id number] Date of Birth: 1940-11-20  Transition of Care [facility]) CM/SW Contact  Joanne Chars, LCSW Phone Number: 02/17/2021, 5:31 PM  Clinical Narrative:   PASSR number: [id number]         Expected Discharge Plan and Services                                                 Social Determinants of Health (SDOH) Interventions    Readmission Risk Interventions No flowsheet data found.

## 2021-02-17 NOTE — Progress Notes (Signed)
TOC CM contacted [employer] to expedite SNF auth to [facility] ref # [id number]. States they will submit for expedite process which may still take 24-72 hours. Contacted [place] to make aware. [place], [place] ED TOC CM [phone]

## 2021-02-17 NOTE — Progress Notes (Signed)
TOC CSW spoke with [employer].  Pt is currently awaiting auth approval.  Auth could take from 3-7 days for approval.  Auth was started 02/16/2021.  CSW will continue to follow for dc needs.  Azalynn Maxim Tarpley-Carter, MSW, LCSW-A Pronouns:  She, Her, Hers                  [place] ED Transitions of CareClinical Social Worker [email or web address] [phone]

## 2021-02-17 NOTE — ED Notes (Signed)
Pt eat 100% of his breakfast.

## 2021-02-17 NOTE — ED Notes (Signed)
Patient was fed his lunch.

## 2021-02-17 NOTE — ED Notes (Signed)
Bed alarm in place and activated  °

## 2021-02-17 NOTE — ED Notes (Signed)
Pt eat 90% of his lunch.

## 2021-02-18 ENCOUNTER — Telehealth: Payer: Self-pay

## 2021-02-18 ENCOUNTER — Telehealth: Payer: Medicare HMO

## 2021-02-18 LAB — POC SARS CORONAVIRUS 2 AG -  ED: SARSCOV2ONAVIRUS 2 AG: NEGATIVE

## 2021-02-18 NOTE — ED Notes (Signed)
[facility] called for transport.  

## 2021-02-18 NOTE — ED Notes (Signed)
Pt was fed and ate 90% of breakfast tray.

## 2021-02-18 NOTE — Progress Notes (Addendum)
2 pm TOC CM received call from [facility] and [place] approved # [id number], contact Ubaldo Glassing [phone] fax [phone], approved from 4/21-4/27 with next review date 4/27, provided to [facility]. call report to [phone] room 125 b. Faxed AVS, COVID results and immunizations to SNF. [facility] called. Notified family, wife that pt will be going to [place] today. Jonnie Finner RN CCM, [facility] ED TOC CM [phone]    122 pm TOC CM received call from [facility] for Peer to Peer, P2P was completed by ED provider, Dr. Zenia Resides and pt was approved by Bernadene Person for SNF rehab for 20 days. [place] to start dc to facility. [place], [place] ED TOC CM [phone]

## 2021-02-18 NOTE — ED Notes (Signed)
Report called and given to [facility].

## 2021-02-18 NOTE — Telephone Encounter (Signed)
  Chronic Care Management   Outreach Note  02/18/2021 Name: Dylan [name]. MRN: [id number] DOB: Jul 30, 1941  Referred by: [name], MD Reason for referral : Chronic Care Management   Patient scheduled for follow up call with SW. Performed chart review to note patient is currently inpatient following a fall.  Follow Up Plan: SW will follow for post discharge needs.  Daneen Schick, BSW, CDP Social Worker, Certified Dementia Practitioner [place] [facility] [place] [facility] [phone]

## 2021-02-18 NOTE — Progress Notes (Signed)
POC SARS Coronavirus 2 Ag-ED - Nasal Swab Order: [id number]  Status: Final result   Visible to patient: No (scheduled for 02/18/2021 4:03 PM)   Next appt: Today at 03:30 PM in Internal Medicine ([place])   0 Result Notes   Ref Range & Units 15:02  SARSCOV2ONAVIRUS 2 AG NEGATIVE NEGATIVE   Comment: (NOTE)  SARS-CoV-2 antigen NOT DETECTED.   Negative results are presumptive. Negative results do not preclude  SARS-CoV-2 infection and should not be used as the sole basis for  treatment or other patient management decisions, including infection  control decisions, particularly in the presence of clinical signs and  symptoms consistent with COVID-19, or in those who have been in  contact with the virus. Negative results must be combined with  clinical observations, patient history, and epidemiological  information. The expected result is Negative.   Fact Sheet for Patients: HandmadeRecipes.com.cy   Fact Sheet for Healthcare Providers:  FuneralLife.at   This test is not yet approved or cleared by the [place] FDA and  has been authorized for detection and/or diagnosis of SARS-CoV-2 by  FDA under an Emergency Use Authorization (EUA). This EUA will  remain in effect (meaning this test can be used) for the duration of  the COVID-19 declaration under Section 564(b)(1) of the Act, 21  U.S.C. section 360bbb-3(b)(1), unless the authorization is  terminated or revoked sooner.    Resulting Agency  [facility]         Specimen Collected: 02/18/21 15:02 Last Resulted: 02/18/21 15:03     Lab Flowsheet   Order Details   View Encounter   Lab and Collection Details   Routing   Result History        Result Care Coordination

## 2021-02-19 ENCOUNTER — Telehealth: Payer: Medicare HMO

## 2021-02-19 ENCOUNTER — Ambulatory Visit: Payer: Self-pay

## 2021-02-19 ENCOUNTER — Ambulatory Visit (INDEPENDENT_AMBULATORY_CARE_PROVIDER_SITE_OTHER): Payer: Medicare HMO

## 2021-02-19 DIAGNOSIS — N183 Chronic kidney disease, stage 3 unspecified: Secondary | ICD-10-CM | POA: Diagnosis not present

## 2021-02-19 DIAGNOSIS — N1831 Chronic kidney disease, stage 3a: Secondary | ICD-10-CM

## 2021-02-19 DIAGNOSIS — G20 Parkinson's disease: Secondary | ICD-10-CM

## 2021-02-19 DIAGNOSIS — E1122 Type 2 diabetes mellitus with diabetic chronic kidney disease: Secondary | ICD-10-CM

## 2021-02-19 DIAGNOSIS — I10 Essential (primary) hypertension: Secondary | ICD-10-CM

## 2021-02-19 DIAGNOSIS — G20A1 Parkinson's disease without dyskinesia, without mention of fluctuations: Secondary | ICD-10-CM

## 2021-02-19 DIAGNOSIS — F0391 Unspecified dementia with behavioral disturbance: Secondary | ICD-10-CM

## 2021-02-19 NOTE — Patient Instructions (Signed)
Social Worker Visit Information  Goals we discussed today:  Goals Addressed            This Visit's Progress   . Disease Progression Managed with Care Coordination       Timeframe:  Long-Range Goal Priority:  High Start Date:  2.21.22                           Expected End Date: 6.21.22                       Next planned outreach: 4.29.22  Patient Goals/Self-Care Activities Over the next 30 days, patient will: With the help of his caregiver [name]  - Patient will self administer medications as prescribed Patient will attend all scheduled provider appointments Patient will call provider office for new concerns or questions Contact SW as needed prior to next scheduled call Engage with [facility] to complete screening for adult day program funding Contact the patients health plan regarding transfer to an alternative rehab facility         Follow Up Plan: SW will follow up with patient by phone over the next week   Daneen Schick, BSW, CDP Social Worker, Certified Dementia Practitioner [place] [facility] [place] [facility] [phone]

## 2021-02-19 NOTE — Chronic Care Management (AMB) (Signed)
Chronic Care Management    Social Work Note  02/19/2021 Name: Dylan [name]. MRN: [id number] DOB: 1941/03/26  Dylan [name]. is a 80 y.o. year old male who is a primary care patient of [name], MD. The CCM team was consulted to assist the patient with chronic disease management and/or care coordination needs related to: Level of Care Concerns.   Engaged with patients spouse by phone for follow up visit in response to provider referral for social work chronic care management and care coordination services.   Consent to Services:  The patient was given information about Chronic Care Management services, agreed to services, and gave verbal consent prior to initiation of services.  Please see initial visit note for detailed documentation.   Patient agreed to services and consent obtained.   Assessment: Review of patient past medical history, allergies, medications, and health status, including review of relevant consultants reports was performed today as part of a comprehensive evaluation and provision of chronic care management and care coordination services.     SDOH (Social Determinants of Health) assessments and interventions performed:    Advanced Directives Status: Not addressed in this encounter.  CCM Care Plan  Allergies  Allergen Reactions  . Quetiapine Other (See Comments)    Agitation     Outpatient Encounter Medications as of 02/19/2021  Medication Sig Note  . apixaban (ELIQUIS) 5 MG TABS tablet Take 1 tablet (5 mg total) by mouth 2 (two) times daily.   . Ascorbic Acid (VITAMIN C) 1000 MG tablet Take 1,000 mg by mouth every morning.    . carbidopa-levodopa (SINEMET IR) 25-100 MG tablet Take 2 tablets by mouth 3 (three) times daily. (Patient taking differently: Take 1 tablet by mouth See admin instructions. Take 1 tablet by mouth at 10 AM and 7 PM)   . carvedilol (COREG) 12.5 MG tablet TAKE 1 TABLET BY MOUTH 2 TIMES DAILY. (Patient taking differently: Take 12.5  mg by mouth 2 (two) times daily with a meal.)   . Cholecalciferol (VITAMIN D3) 5000 units CAPS Take 5,000 Units by mouth at bedtime.   [name] desonide (DESOWEN) 0.05 % lotion Apply topically 2 times per day prn (Patient taking differently: Apply 1 application topically 2 (two) times daily as needed (for irritation- to affected areas of the face).)   . diclofenac Sodium (VOLTAREN) 1 % GEL Apply 2 g topically 3 (three) times daily. (Patient taking differently: Apply 2 g topically as needed (to painful sites).)   . 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 Glycerin (OASIS TEARS PF OP) Place 1 drop into both eyes in the morning.   Elmore Guise Devices (ONE TOUCH DELICA LANCING DEV) MISC Please fill ONE TOUCH DELICA LANCING DEVICE.  Use to test blood sugar twice daily as directed. E11.65 (Patient taking differently: Please fill ONE TOUCH DELICA LANCING DEVICE.  Use to test blood sugar twice daily as directed. E11.65)   . Lancets MISC Please fill basic/generic push button lancets.  Patient to test blood sugar twice daily. DX: E11.65   . linaclotide (LINZESS) 72 MCG capsule Take 1 capsule (72 mcg total) by mouth daily before breakfast. (Patient not taking: No sig reported)   . Melatonin 10 MG TABS Take 10-20 mg by mouth at bedtime as needed (for sleep).   . memantine (NAMENDA) 10 MG tablet TAKE 1 TABLET BY MOUTH TWICE A DAY (Patient taking differently: Take 10 mg by mouth 2 (two) times  daily.)   . metFORMIN (GLUCOPHAGE) 1000 MG tablet Take 1 tablet by mouth daily (Patient taking differently: Take 1,000 mg by mouth daily with breakfast.)   . pravastatin (PRAVACHOL) 20 MG tablet TAKE 1 TABLET BY ORAL ROUTE EVERY DAY (Patient taking differently: Take 20 mg by mouth daily.)   . QUEtiapine (SEROQUEL) 25 MG tablet TAKE 1 TABLET (25 MG TOTAL) BY MOUTH AT BEDTIME AS NEEDED. 02/10/2021: The patient's wife said she has been holding this med, as of late, because it seems  to make him "agitated- he becomes worse."  . rivastigmine (EXELON) 4.6 mg/24hr Place 1 patch (4.6 mg total) onto the skin daily. (Patient taking differently: Place 4.6 mg onto the skin every evening.)   . senna-docusate (SENOKOT-S) 8.6-50 MG tablet Take 1 tablet by mouth every other day.   . TRULICITY 1.5 UK/0.2RK SOPN INJECT 1.5 MG INTO THE SKIN ONCE A WEEK. (Patient taking differently: Inject 1.5 mg into the skin every Monday.)    No facility-administered encounter medications on file as of 02/19/2021.    Patient Active Problem List   Diagnosis Date Noted  . TIA (transient ischemic attack) 02/10/2021  . Acute encephalopathy 02/10/2021  . Chronic constipation 12/24/2020  . Parkinson's disease ([place]) 12/24/2020  . Encounter for care of pacemaker 07/11/2020  . 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 DNR (do not resuscitate) 06/08/2020  . Elevated CK 04/27/2020  . Gout   . Hyperlipemia   . Parkinsonism ([place]) 01/29/2020  . 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 Dementia with behavioral disturbance ([place]) 05/22/2019  . Gait abnormality 05/22/2019  . AKI (acute kidney injury) ([place]) 07/14/2018  . Nephropathy 07/14/2018  . Other disorders of lung 07/14/2018  . Atherosclerosis of aorta ([place]) 07/14/2018  . Dehydration 07/14/2018  . 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 CKD (chronic kidney disease), stage III ([place]) 06/13/2018  . Syncope 06/13/2018  . 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 Peripheral arterial disease ([place]) 05/13/2014  . Essential hypertension 05/13/2014  . Type 2 diabetes mellitus with stage 3a chronic kidney disease, without long-term current use of insulin ([place]) 05/13/2014    Conditions to be addressed/monitored: DMII, CKD Stage III, Dementia and Parkinsonism; Level of care concerns  Care Plan : Social  Work Care Plan  Updates made by Daneen Schick since 02/19/2021 12:00 AM    Problem: Disease Progression     Long-Range Goal: Disease Progression Managed with Care Coordination   Start Date: 12/21/2020  Expected End Date: 04/20/2021  Recent Progress: On track  Priority: High  Note:   Current Barriers:  . Level of care concerns . Recent Diagnosis of Prostate Cancer  . Chronic conditions including DM II, CKD III, Dementia, and Parkinson's disease which put patient at increased risk of hospitaliation . Unable to perform ADLs independently . Unable to perform IADLs independently  Social Work Clinical Goal(s):  [name] Over the next 120 days the patient and his spouse, Dylan [name], will work with SW to address care coordination needs related to disease progression  Interventions: . 1:1 collaboration with [name], MD regarding development and update of comprehensive plan of care as evidenced by provider attestation and co-signature . Inter-disciplinary care team collaboration (see longitudinal plan of care) . Collaboration with [place] who reports patient is currently at [facility] - Patients spouse [name] contacted Mrs.  Little to express concern with patients care indicating he is not being given his medications appropriately . Discussed plans for SW and RN Care Manager to follow closely while patient is in rehab . Inbound call received from Dylan [name] who requests feedback on if she is able to move patient to an alternative rehab facility - [name] is questioning if the patient health plan would cover a new facility . Advised [name] to contact the patients health plan regarding payment questions . Discussed plan for SW and RN Care Manager to follow up next week on patients status . Encouraged [name] to contact the patients primary care team as needed  Patient Goals/Self-Care Activities Over the next 5 days, patient will: With the help  of his caregiver Dylan [name]  - Patient will self administer medications as prescribed Patient will attend all scheduled provider appointments Patient will call provider office for new concerns or questions Contact SW as needed prior to next scheduled call Engage with [facility] to complete screening for adult day program funding Contact the patients health plan regarding transfer to an alternative rehab facility  Follow up Plan: SW will follow up with patient by phone over the next 10 days       Follow Up Plan: SW will follow up with patient by phone over the next week      Daneen Schick, BSW, CDP Social Worker, Certified Dementia Practitioner [place] [facility] [facility] [phone]  Total time spent performing care coordination and/or care management activities with the patient by phone or face to face = 32 minutes.

## 2021-02-26 ENCOUNTER — Ambulatory Visit: Payer: Medicare HMO

## 2021-02-26 DIAGNOSIS — F0391 Unspecified dementia with behavioral disturbance: Secondary | ICD-10-CM

## 2021-02-26 DIAGNOSIS — N183 Chronic kidney disease, stage 3 unspecified: Secondary | ICD-10-CM

## 2021-02-26 DIAGNOSIS — E1122 Type 2 diabetes mellitus with diabetic chronic kidney disease: Secondary | ICD-10-CM

## 2021-02-26 DIAGNOSIS — N1831 Chronic kidney disease, stage 3a: Secondary | ICD-10-CM

## 2021-02-26 NOTE — Patient Instructions (Signed)
Social Worker Visit Information  Goals we discussed today:  Goals Addressed            This Visit's Progress   . Disease Progression Managed with Care Coordination       Timeframe:  Long-Range Goal Priority:  High Start Date:  2.21.22                           Expected End Date: 6.21.22                       Next planned outreach: 5.6.22  Patient Goals/Self-Care Activities Over the next 30 days, patient will: With the help of his caregiver [name]  - Patient will self administer medications as prescribed Patient will attend all scheduled provider appointments Patient will call provider office for new concerns or questions Contact SW as needed prior to next scheduled call Engage with Phillips Hay to complete screening for adult day program funding Engage with therapy team to improve strength and mobility       Follow Up Plan: SW will follow up with patient by phone over the next 10 days   [place], CDP Social Worker, Certified Dementia Practitioner [place] [facility] [place] [facility] [phone]

## 2021-02-26 NOTE — Chronic Care Management (AMB) (Signed)
Chronic Care Management    Social Work Note  02/26/2021 Name: Dylan [name]. MRN: [id number] DOB: [birth year]  Dylan [name]. is a 80 y.o. year old male who is a primary care patient of [name], MD. The CCM team was consulted to assist the patient with chronic disease management and/or care coordination needs related to: Level of Care Concerns.   Engaged with Dylan [name] by phone for follow up visit in response to provider referral for social work chronic care management and care coordination services.   Consent to Services:  The patient was given information about Chronic Care Management services, agreed to services, and gave verbal consent prior to initiation of services.  Please see initial visit note for detailed documentation.   Patient agreed to services and consent obtained.   Assessment: Review of patient past medical history, allergies, medications, and health status, including review of relevant consultants reports was performed today as part of a comprehensive evaluation and provision of chronic care management and care coordination services.     SDOH (Social Determinants of Health) assessments and interventions performed:    Advanced Directives Status: Not addressed in this encounter.  CCM Care Plan  Allergies  Allergen Reactions  . Quetiapine Other (See Comments)    Agitation     Outpatient Encounter Medications as of 02/26/2021  Medication Sig Note  . apixaban (ELIQUIS) 5 MG TABS tablet Take 1 tablet (5 mg total) by mouth 2 (two) times daily.   . Ascorbic Acid (VITAMIN C) 1000 MG tablet Take 1,000 mg by mouth every morning.    . carbidopa-levodopa (SINEMET IR) 25-100 MG tablet Take 2 tablets by mouth 3 (three) times daily. (Patient taking differently: Take 1 tablet by mouth See admin instructions. Take 1 tablet by mouth at 10 AM and 7 PM)   . carvedilol (COREG) 12.5 MG tablet TAKE 1 TABLET BY MOUTH 2 TIMES DAILY. (Patient taking differently: Take 12.5  mg by mouth 2 (two) times daily with a meal.)   . Cholecalciferol (VITAMIN D3) 5000 units CAPS Take 5,000 Units by mouth at bedtime.   [name] desonide (DESOWEN) 0.05 % lotion Apply topically 2 times per day prn (Patient taking differently: Apply 1 application topically 2 (two) times daily as needed (for irritation- to affected areas of the face).)   . diclofenac Sodium (VOLTAREN) 1 % GEL Apply 2 g topically 3 (three) times daily. (Patient taking differently: Apply 2 g topically as needed (to painful sites).)   . 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 Glycerin (OASIS TEARS PF OP) Place 1 drop into both eyes in the morning.   Elmore Guise Devices (ONE TOUCH DELICA LANCING DEV) MISC Please fill ONE TOUCH DELICA LANCING DEVICE.  Use to test blood sugar twice daily as directed. E11.65 (Patient taking differently: Please fill ONE TOUCH DELICA LANCING DEVICE.  Use to test blood sugar twice daily as directed. E11.65)   . Lancets MISC Please fill basic/generic push button lancets.  Patient to test blood sugar twice daily. DX: E11.65   . linaclotide (LINZESS) 72 MCG capsule Take 1 capsule (72 mcg total) by mouth daily before breakfast. (Patient not taking: No sig reported)   . Melatonin 10 MG TABS Take 10-20 mg by mouth at bedtime as needed (for sleep).   . memantine (NAMENDA) 10 MG tablet TAKE 1 TABLET BY MOUTH TWICE A DAY (Patient taking differently: Take 10 mg by mouth 2 (two) times  daily.)   . metFORMIN (GLUCOPHAGE) 1000 MG tablet Take 1 tablet by mouth daily (Patient taking differently: Take 1,000 mg by mouth daily with breakfast.)   . pravastatin (PRAVACHOL) 20 MG tablet TAKE 1 TABLET BY ORAL ROUTE EVERY DAY (Patient taking differently: Take 20 mg by mouth daily.)   . QUEtiapine (SEROQUEL) 25 MG tablet TAKE 1 TABLET (25 MG TOTAL) BY MOUTH AT BEDTIME AS NEEDED. 02/10/2021: The patient's wife said she has been holding this med, as of late, because it seems  to make him "agitated- he becomes worse."  . rivastigmine (EXELON) 4.6 mg/24hr Place 1 patch (4.6 mg total) onto the skin daily. (Patient taking differently: Place 4.6 mg onto the skin every evening.)   . senna-docusate (SENOKOT-S) 8.6-50 MG tablet Take 1 tablet by mouth every other day.   . TRULICITY 1.5 GM/0.1UU SOPN INJECT 1.5 MG INTO THE SKIN ONCE A WEEK. (Patient taking differently: Inject 1.5 mg into the skin every Monday.)    No facility-administered encounter medications on file as of 02/26/2021.    Patient Active Problem List   Diagnosis Date Noted  . TIA (transient ischemic attack) 02/10/2021  . Acute encephalopathy 02/10/2021  . Chronic constipation 12/24/2020  . Parkinson's disease ([place]) 12/24/2020  . Encounter for care of pacemaker 07/11/2020  . 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 DNR (do not resuscitate) 06/08/2020  . Elevated CK 04/27/2020  . Gout   . Hyperlipemia   . Parkinsonism ([place]) 01/29/2020  . 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 Dementia with behavioral disturbance ([place]) 05/22/2019  . Gait abnormality 05/22/2019  . AKI (acute kidney injury) ([place]) 07/14/2018  . Nephropathy 07/14/2018  . Other disorders of lung 07/14/2018  . Atherosclerosis of aorta ([place]) 07/14/2018  . Dehydration 07/14/2018  . 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 CKD (chronic kidney disease), stage III ([place]) 06/13/2018  . Syncope 06/13/2018  . 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 Peripheral arterial disease ([place]) 05/13/2014  . Essential hypertension 05/13/2014  . Type 2 diabetes mellitus with stage 3a chronic kidney disease, without long-term current use of insulin ([place]) 05/13/2014    Conditions to be addressed/monitored: DMII, CKD Stage III and Dementia; Level of care concerns  Care Plan : Social Work Care  Plan  Updates made by Daneen Schick since 02/26/2021 12:00 AM    Problem: Disease Progression     Long-Range Goal: Disease Progression Managed with Care Coordination   Start Date: 12/21/2020  Expected End Date: 04/20/2021  Recent Progress: On track  Priority: High  Note:   Current Barriers:  . Level of care concerns . Recent Diagnosis of Prostate Cancer  . Chronic conditions including DM II, CKD III, Dementia, and Parkinson's disease which put patient at increased risk of hospitaliation . Unable to perform ADLs independently . Unable to perform IADLs independently  Social Work Clinical Goal(s):  [name] Over the next 120 days the patient and his spouse, Dylan [name], will work with SW to address care coordination needs related to disease progression  Interventions: . 1:1 collaboration with [name], MD regarding development and update of comprehensive plan of care as evidenced by provider attestation and co-signature . Inter-disciplinary care team collaboration (see longitudinal plan of care) . Successful outbound call placed to [name] to follow up on patients progression in rehab . Discussed the patient remains at [facility]  for rehab . Determined [name] feels since speaking with staff about concerns things have improved . Scheduled follow up call over the next 10 days closely while patient is in rehab  Patient Goals/Self-Care Activities Over the next 15 days, patient will: With the help of his caregiver Dylan [name]  - Patient will self administer medications as prescribed Patient will attend all scheduled provider appointments Patient will call provider office for new concerns or questions Contact SW as needed prior to next scheduled call Engage with [facility] Hay to complete screening for adult day program funding Engage with therapy team to improve strength and mobility  Follow up Plan: SW will follow up with patient by phone over the next 10 days        Follow Up Plan: SW will follow up with patient by phone over the next 10 days.      Daneen Schick, BSW, CDP Social Worker, Certified Dementia Practitioner [place] [facility] [place] [facility] [phone]  Total time spent performing care coordination and/or care management activities with the patient by phone or face to face = 15 minutes.

## 2021-02-27 ENCOUNTER — Emergency Department (HOSPITAL_COMMUNITY): Payer: Medicare HMO

## 2021-02-27 ENCOUNTER — Emergency Department (HOSPITAL_COMMUNITY)
Admission: EM | Admit: 2021-02-27 | Discharge: 2021-02-27 | Disposition: A | Discharge status: Home / Self Care | Payer: Medicare HMO | Attending: Emergency Medicine | Admitting: Emergency Medicine

## 2021-02-27 ENCOUNTER — Encounter (HOSPITAL_COMMUNITY): Payer: Self-pay

## 2021-02-27 DIAGNOSIS — G20 Parkinson's disease: Secondary | ICD-10-CM | POA: Insufficient documentation

## 2021-02-27 DIAGNOSIS — W06XXXA Fall from bed, initial encounter: Secondary | ICD-10-CM | POA: Diagnosis not present

## 2021-02-27 DIAGNOSIS — Z7984 Long term (current) use of oral hypoglycemic drugs: Secondary | ICD-10-CM | POA: Insufficient documentation

## 2021-02-27 DIAGNOSIS — S0990XA Unspecified injury of head, initial encounter: Secondary | ICD-10-CM | POA: Diagnosis present

## 2021-02-27 DIAGNOSIS — Y9289 Other specified places as the place of occurrence of the external cause: Secondary | ICD-10-CM | POA: Insufficient documentation

## 2021-02-27 DIAGNOSIS — Z950 Presence of cardiac pacemaker: Secondary | ICD-10-CM | POA: Diagnosis not present

## 2021-02-27 DIAGNOSIS — I129 Hypertensive chronic kidney disease with stage 1 through stage 4 chronic kidney disease, or unspecified chronic kidney disease: Secondary | ICD-10-CM | POA: Insufficient documentation

## 2021-02-27 DIAGNOSIS — Z794 Long term (current) use of insulin: Secondary | ICD-10-CM | POA: Insufficient documentation

## 2021-02-27 DIAGNOSIS — Z8673 Personal history of transient ischemic attack (TIA), and cerebral infarction without residual deficits: Secondary | ICD-10-CM | POA: Insufficient documentation

## 2021-02-27 DIAGNOSIS — E1122 Type 2 diabetes mellitus with diabetic chronic kidney disease: Secondary | ICD-10-CM | POA: Diagnosis not present

## 2021-02-27 DIAGNOSIS — N1831 Chronic kidney disease, stage 3a: Secondary | ICD-10-CM | POA: Diagnosis not present

## 2021-02-27 DIAGNOSIS — Z7901 Long term (current) use of anticoagulants: Secondary | ICD-10-CM | POA: Insufficient documentation

## 2021-02-27 DIAGNOSIS — F0281 Dementia in other diseases classified elsewhere with behavioral disturbance: Secondary | ICD-10-CM | POA: Insufficient documentation

## 2021-02-27 DIAGNOSIS — Z87891 Personal history of nicotine dependence: Secondary | ICD-10-CM | POA: Insufficient documentation

## 2021-02-27 DIAGNOSIS — Z79899 Other long term (current) drug therapy: Secondary | ICD-10-CM | POA: Diagnosis not present

## 2021-02-27 DIAGNOSIS — W19XXXA Unspecified fall, initial encounter: Secondary | ICD-10-CM

## 2021-02-27 NOTE — ED Notes (Signed)
[facility] contacted, transport arranged. Attempted to call report to facility x 2, no answer.

## 2021-02-27 NOTE — ED Notes (Signed)
Pt awaiting transport. Resting in stretcher comfortably.

## 2021-02-27 NOTE — ED Provider Notes (Signed)
[facility] DEPT Provider Note   CSN: [id number] Arrival date & time: 02/27/21  0132     History Chief Complaint - fall Level 5 caveat due to dementia Dylan [name]. is a 80 y.o. male.  The history is provided by the patient and the nursing home.  Fall This is a new problem. The current episode started 6 to 12 hours ago. The problem occurs constantly. The problem has not changed since onset.Associated symptoms include headaches. Nothing aggravates the symptoms. Nothing relieves the symptoms.  Patient with extensive history including Parkinson's, dementia, hypertension, atrial fibrillation on anticoagulation presents after fall.  Patient presents from [place]. Apparently the patient rolled out of bed and was reporting that he hit his head and had a headache. No other acute issues at this time     Past Medical History:  Diagnosis Date  . Anxiety   . Aortic stenosis    mild AS 11/24/16 (peak grad 24, mean grad 11) Dr. [name]  . CHB (complete heart block) ([place]) 07/2017  . 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 Diabetes mellitus without complication ([place])   . Encounter for care of pacemaker 07/11/2020  . Gait abnormality   . Gout   . Hyperlipemia   . Hypertension   . 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 Memory loss   . Pacemaker Dual chamber [place] MRI  model [id number] 07/11/2020 06/08/2020  . Peripheral arterial disease ([place])   . 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 Second degree AV block    Wenckebach; no indication for pacemaker as of 12/01/16 (Dr. [name])  . Vitamin B12 deficiency anemia 03/01/2018  . Vitamin D deficiency disease     Patient Active Problem List   Diagnosis Date Noted  . TIA (transient ischemic attack) 02/10/2021  . Acute encephalopathy 02/10/2021  . Chronic constipation 12/24/2020  . Parkinson's disease ([place]) 12/24/2020   . Encounter for care of pacemaker 07/11/2020  . Pacemaker Dual chamber [place] MRI  model [id number] 07/11/2020 06/08/2020  . 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 DNR (do not resuscitate) 06/08/2020  . Elevated CK 04/27/2020  . Gout   . Hyperlipemia   . Parkinsonism ([place]) 01/29/2020  . 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 Dementia with behavioral disturbance ([place]) 05/22/2019  . Gait abnormality 05/22/2019  . AKI (acute kidney injury) ([place]) 07/14/2018  . Nephropathy 07/14/2018  . Other disorders of lung 07/14/2018  . Atherosclerosis of aorta ([place]) 07/14/2018  . Dehydration 07/14/2018  . 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 CKD (chronic kidney disease), stage III ([place]) 06/13/2018  . Syncope 06/13/2018  . 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[place]) 06/23/2015  . Peripheral arterial disease ([place]) 05/13/2014  . Essential hypertension 05/13/2014  . Type 2 diabetes mellitus with stage 3a chronic kidney disease, without long-term current use of insulin ([place]) 05/13/2014    Past Surgical History:  Procedure Laterality Date  . CARDIOVASCULAR STRESS TEST     11/21/16 Low risk study, EF 52% [facility])  . EYE SURGERY    . KYPHOPLASTY N/A 02/15/2017   Procedure: LUMBAR FOUR KYPHOPLASTY;  Surgeon: Phylliss Bob, MD;  Location: [facility];  Service: Orthopedics;  Laterality: N/A;  . lipoma surgery     neck - 30 years ago  . PACEMAKER IMPLANT N/A 07/11/2017   Procedure: Pacemaker Implant;  Surgeon: Curt Bears, Will  Hassell Done, MD;  Location: [facility] CV LAB;  Service: Cardiovascular;  Laterality: N/A;  . TRANSTHORACIC ECHOCARDIOGRAM     11/24/16 [facility]): EF 55-60%, mild AS, mild-mod MR, mod TR, moderate pulm HTN, PAP 49 mmHg       Family History  Problem Relation Age of Onset  . Diabetes Mother   . Breast cancer Mother   . Arthritis Mother   . Hypertension Father   . Diabetes Sister   . Diabetes  Brother   . Diabetes Maternal Grandmother   . Diabetes Brother   . Diabetes Brother   . Diabetes Brother   . Diabetes Sister   . Diabetes Sister   . Diabetes Sister     Social History   Tobacco Use  . Smoking status: Former Smoker    Packs/day: 0.50    Years: 7.00    Pack years: 3.50    Types: Cigarettes  . Smokeless tobacco: Never Used  . Tobacco comment: quit 35 years  Vaping Use  . Vaping Use: Never used  Substance Use Topics  . Alcohol use: No  . Drug use: No    Home Medications Prior to Admission medications   Medication Sig Start Date End Date Taking? Authorizing Provider  apixaban (ELIQUIS) 5 MG TABS tablet Take 1 tablet (5 mg total) by mouth 2 (two) times daily. 02/11/21   Danford, Suann Larry, MD  Ascorbic Acid (VITAMIN C) 1000 MG tablet Take 1,000 mg by mouth every morning.     [provider]  carbidopa-levodopa (SINEMET IR) 25-100 MG tablet Take 2 tablets by mouth 3 (three) times daily. Patient taking differently: Take 1 tablet by mouth See admin instructions. Take 1 tablet by mouth at 10 AM and 7 PM 12/24/20   Marcial Pacas, MD  carvedilol (COREG) 12.5 MG tablet TAKE 1 TABLET BY MOUTH 2 TIMES DAILY. Patient taking differently: Take 12.5 mg by mouth 2 (two) times daily with a meal. 07/07/20   [name], MD  Cholecalciferol (VITAMIN D3) 5000 units CAPS Take 5,000 Units by mouth at bedtime.    [provider]  desonide (DESOWEN) 0.05 % lotion Apply topically 2 times per day prn Patient taking differently: Apply 1 application topically 2 (two) times daily as needed (for irritation- to affected areas of the face). 06/17/20   [name], MD  diclofenac Sodium (VOLTAREN) 1 % GEL Apply 2 g topically 3 (three) times daily. Patient taking differently: Apply 2 g topically as needed (to painful sites). 10/10/19   Rodriguez-Southworth, Sunday Spillers, PA-C  glucose blood (ONETOUCH VERIO) test strip Use as instructed to check blood sugars daily dx: e11.22 01/06/20    [name], MD  Glycerin (OASIS TEARS PF OP) Place 1 drop into both eyes in the morning.    [provider]  Lancet Devices (ONE TOUCH DELICA LANCING DEV) MISC Please fill ONE TOUCH DELICA LANCING DEVICE.  Use to test blood sugar twice daily as directed. E11.65 Patient taking differently: Please fill ONE TOUCH DELICA LANCING DEVICE.  Use to test blood sugar twice daily as directed. E11.65 11/15/19   [name], MD  Lancets MISC Please fill basic/generic push button lancets.  Patient to test blood sugar twice daily. DX: E11.65 12/06/19   [name], MD  linaclotide Rolan Lipa) 72 MCG capsule Take 1 capsule (72 mcg total) by mouth daily before breakfast. Patient not taking: No sig reported 12/19/20   [name], MD  Melatonin 10 MG TABS Take 10-20 mg by mouth at bedtime as needed (  for sleep).    [provider]  memantine (NAMENDA) 10 MG tablet TAKE 1 TABLET BY MOUTH TWICE A DAY Patient taking differently: Take 10 mg by mouth 2 (two) times daily. 07/07/20   [name], MD  metFORMIN (GLUCOPHAGE) 1000 MG tablet Take 1 tablet by mouth daily Patient taking differently: Take 1,000 mg by mouth daily with breakfast. 12/24/20   [name], MD  pravastatin (PRAVACHOL) 20 MG tablet TAKE 1 TABLET BY ORAL ROUTE EVERY DAY Patient taking differently: Take 20 mg by mouth daily. 12/01/20   [name], MD  QUEtiapine (SEROQUEL) 25 MG tablet TAKE 1 TABLET (25 MG TOTAL) BY MOUTH AT BEDTIME AS NEEDED. 08/21/20   Marcial Pacas, MD  rivastigmine (EXELON) 4.6 mg/24hr Place 1 patch (4.6 mg total) onto the skin daily. Patient taking differently: Place 4.6 mg onto the skin every evening. 10/26/20   Suzzanne Cloud, NP  senna-docusate (SENOKOT-S) 8.6-50 MG tablet Take 1 tablet by mouth every other day.    [provider]  TRULICITY 1.5 TM/2.2QJ SOPN INJECT 1.5 MG INTO THE SKIN ONCE A WEEK. Patient taking differently: Inject 1.5 mg into the skin every Monday. 09/07/20   [name],  MD    Allergies    Quetiapine  Review of Systems   Review of Systems  Unable to perform ROS: Dementia  Neurological: Positive for headaches.    Physical Exam Updated Vital Signs BP (!) 160/138   Pulse 72   Temp 97.8 F (36.6 C) (Oral)   Resp 19   Ht 1.676 m (5\' 6" )   Wt 74 kg   SpO2 100%   BMI 26.33 kg/m   Physical Exam CONSTITUTIONAL: Elderly and frail HEAD: Normocephalic/atraumatic, no visible trauma EYES: EOMI/PERRL ENMT: Mucous membranes moist NECK: supple no meningeal signs SPINE/BACK: No cervical spine tenderness CV: S1/S2 noted LUNGS: Lungs are clear to auscultation bilaterally, no apparent distress ABDOMEN: soft, nontenders NEURO: Pt is awake/alert, answers most questions with yes and no.  Overall in no acute distress. EXTREMITIES: pulses normal/equal, chronic contractures noted of his upper extremities. All other extremities/joints palpated/ranged and nontender SKIN: warm, color normal  ED Results / Procedures / Treatments   Labs (all labs ordered are listed, but only abnormal results are displayed) Labs Reviewed - No data to display  EKG None  Radiology CT Head Wo Contrast  Result Date: 02/27/2021 CLINICAL DATA:  80 year old male status post fall last night. EXAM: CT HEAD WITHOUT CONTRAST TECHNIQUE: Contiguous axial images were obtained from the base of the skull through the vertex without intravenous contrast. COMPARISON:  Brain MRI 02/10/2021.  Head CT 02/09/2021. FINDINGS: Brain: Stable cerebral volume. Stable gray-white matter differentiation throughout the brain. No midline shift, ventriculomegaly, mass effect, evidence of mass lesion, intracranial hemorrhage or evidence of cortically based acute infarction. Stable dystrophic calcification left basal ganglia. Vascular: Extensive Calcified atherosclerosis at the skull base. No suspicious intracranial vascular hyperdensity. Skull: Stable and intact. Sinuses/Orbits: Visualized paranasal sinuses and  mastoids are stable and well aerated. Other: No acute orbit or scalp soft tissue injury identified. IMPRESSION: No acute intracranial abnormality or acute traumatic injury identified. Electronically Signed   By: Genevie Ann M.D.   On: 02/27/2021 06:35    Procedures Procedures   Medications Ordered in ED Medications - No data to display  ED Course  I have reviewed the triage vital signs and the nursing notes.  Pertinent  imaging results that were available during my care of the patient were reviewed by me and considered  in my medical decision making (see chart for details).    MDM Rules/Calculators/A&P                          Patient presents from nursing facility after falling out of bed.  He is on anticoagulation.  I spoke to staff at the facility.  They report the patient is otherwise at his baseline, but was reporting a headache after falling out of bed.  No other signs of acute traumatic injury.  Patient has chronic contractures which are unchanged 6:42 AM CT head negative.  Patient will be discharged back to facility Final Clinical Impression(s) / ED Diagnoses Final diagnoses:  Fall, initial encounter  Injury of head, initial encounter    Rx / DC Orders ED Discharge Orders    None       Ripley Fraise, MD 02/27/21 [id number]

## 2021-02-27 NOTE — ED Notes (Signed)
[facility] at bedside for transport. [place] and notified them of pt's return.

## 2021-02-27 NOTE — ED Triage Notes (Signed)
Patient arrives via EMS from SNF with chief complaint of a fall that happened before 10 pm tonight. Patient reports hitting his head but denies pain. EMS reports fall was less than a foot from bed to floor. No blood thinners, Hx of dementia.   EMS vitals BP 160/84 HR 70 hx of AFib RR 14 SPO2 96% RA CBG 147

## 2021-03-01 NOTE — Chronic Care Management (AMB) (Signed)
Chronic Care Management   CCM RN Visit Note  02/19/2021 Name: Dylan [name]. MRN: [id number] DOB: [birth year]  Subjective: Dylan [name]. is a 80 y.o. year old male who is a primary care patient of Tejan-Sie, Brandt Loosen, MD. The care management team was consulted for assistance with disease management and care coordination needs.    Engaged with patient by telephone for follow up visit in response to provider referral for case management and/or care coordination services.   Consent to Services:  The patient was given information about Chronic Care Management services, agreed to services, and gave verbal consent prior to initiation of services.  Please see initial visit note for detailed documentation.   Patient agreed to services and verbal consent obtained.   Assessment: Review of patient past medical history, allergies, medications, health status, including review of consultants reports, laboratory and other test data, was performed as part of comprehensive evaluation and provision of chronic care management services.   SDOH (Social Determinants of Health) assessments and interventions performed:  No  CCM Care Plan  Allergies  Allergen Reactions  . Quetiapine Other (See Comments)    Agitation     Outpatient Encounter Medications as of 02/19/2021  Medication Sig Note  . apixaban (ELIQUIS) 5 MG TABS tablet Take 1 tablet (5 mg total) by mouth 2 (two) times daily.   . Ascorbic Acid (VITAMIN C) 1000 MG tablet Take 1,000 mg by mouth every morning.    . carbidopa-levodopa (SINEMET IR) 25-100 MG tablet Take 2 tablets by mouth 3 (three) times daily. (Patient taking differently: Take 1 tablet by mouth See admin instructions. Take 1 tablet by mouth at 10 AM and 7 PM)   . carvedilol (COREG) 12.5 MG tablet TAKE 1 TABLET BY MOUTH 2 TIMES DAILY. (Patient taking differently: Take 12.5 mg by mouth 2 (two) times daily with a meal.)   . Cholecalciferol (VITAMIN D3) 5000 units CAPS Take 5,000  Units by mouth at bedtime.   [name] desonide (DESOWEN) 0.05 % lotion Apply topically 2 times per day prn (Patient taking differently: Apply 1 application topically 2 (two) times daily as needed (for irritation- to affected areas of the face).)   . diclofenac Sodium (VOLTAREN) 1 % GEL Apply 2 g topically 3 (three) times daily. (Patient taking differently: Apply 2 g topically as needed (to painful sites).)   . 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 Glycerin (OASIS TEARS PF OP) Place 1 drop into both eyes in the morning.   Elmore Guise Devices (ONE TOUCH DELICA LANCING DEV) MISC Please fill ONE TOUCH DELICA LANCING DEVICE.  Use to test blood sugar twice daily as directed. E11.65 (Patient taking differently: Please fill ONE TOUCH DELICA LANCING DEVICE.  Use to test blood sugar twice daily as directed. E11.65)   . Lancets MISC Please fill basic/generic push button lancets.  Patient to test blood sugar twice daily. DX: E11.65   . linaclotide (LINZESS) 72 MCG capsule Take 1 capsule (72 mcg total) by mouth daily before breakfast. (Patient not taking: No sig reported)   . Melatonin 10 MG TABS Take 10-20 mg by mouth at bedtime as needed (for sleep).   . memantine (NAMENDA) 10 MG tablet TAKE 1 TABLET BY MOUTH TWICE A DAY (Patient taking differently: Take 10 mg by mouth 2 (two) times daily.)   . metFORMIN (GLUCOPHAGE) 1000 MG tablet Take 1 tablet by mouth daily (Patient taking differently: Take 1,000 mg  by mouth daily with breakfast.)   . pravastatin (PRAVACHOL) 20 MG tablet TAKE 1 TABLET BY ORAL ROUTE EVERY DAY (Patient taking differently: Take 20 mg by mouth daily.)   . QUEtiapine (SEROQUEL) 25 MG tablet TAKE 1 TABLET (25 MG TOTAL) BY MOUTH AT BEDTIME AS NEEDED. 02/10/2021: The patient's wife said she has been holding this med, as of late, because it seems to make him "agitated- he becomes worse."  . rivastigmine (EXELON) 4.6 mg/24hr Place 1 patch (4.6 mg  total) onto the skin daily. (Patient taking differently: Place 4.6 mg onto the skin every evening.)   . senna-docusate (SENOKOT-S) 8.6-50 MG tablet Take 1 tablet by mouth every other day.   . TRULICITY 1.5 EX/9.3ZJ SOPN INJECT 1.5 MG INTO THE SKIN ONCE A WEEK. (Patient taking differently: Inject 1.5 mg into the skin every Monday.)    No facility-administered encounter medications on file as of 02/19/2021.    Patient Active Problem List   Diagnosis Date Noted  . TIA (transient ischemic attack) 02/10/2021  . Acute encephalopathy 02/10/2021  . Chronic constipation 12/24/2020  . Parkinson's disease ([place]) 12/24/2020  . Encounter for care of pacemaker 07/11/2020  . Pacemaker Dual chamber [place] MRI  model [id number] 07/11/2020 06/08/2020  . 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 DNR (do not resuscitate) 06/08/2020  . Elevated CK 04/27/2020  . Gout   . Hyperlipemia   . Parkinsonism ([place]) 01/29/2020  . 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 Dementia with behavioral disturbance ([place]) 05/22/2019  . Gait abnormality 05/22/2019  . AKI (acute kidney injury) ([place]) 07/14/2018  . Nephropathy 07/14/2018  . Other disorders of lung 07/14/2018  . Atherosclerosis of aorta ([place]) 07/14/2018  . Dehydration 07/14/2018  . 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 CKD (chronic kidney disease), stage III ([place]) 06/13/2018  . Syncope 06/13/2018  . 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[place]) 06/23/2015  . Peripheral arterial disease ([place]) 05/13/2014  . Essential hypertension 05/13/2014  . Type 2 diabetes mellitus with stage 3a chronic kidney disease, without long-term current use of insulin ([place]) 05/13/2014    Conditions to be addressed/monitored:Type II DM, non-insulin dependent, CKD, stage III, Essential hypertension, Dementia, Parkinsonism, unspecified  Care Plan : Dementia (Adult)  Updates made by Lynne Logan, RN since 03/01/2021  12:00 AM    Problem: Behavioral Symptoms   Priority: High    Long-Range Goal: Behavior Symptoms Management   Start Date: 10/15/2020  Expected End Date: 04/15/2021  Recent Progress: On track  Priority: High  Note:   Current Barriers:   Ineffective Self Health Maintenance  Unable to self administer medications as prescribed  Lacks social connections  Unable to perform ADLs independently  Unable to perform IADLs independently  Currently UNABLE TO independently self manage needs related to chronic health conditions.   Knowledge Deficits related to short term plan for care coordination needs and long term plans for chronic disease management needs Nurse Case Manager Clinical Goal(s):   Patient will work with care management team to address care coordination and chronic disease management needs related to Disease Management  Educational Needs  Care Coordination  Medication Management and Education  Psychosocial Support  Dementia and Caregiver Support   Interventions:  . Collaboration with [name], MD regarding development and update of comprehensive plan of care as evidenced by provider attestation and co-signature . Inter-disciplinary care team collaboration (see longitudinal plan of care) . 02/19/21 completed outbound successful call with spouse  [name]  . Provided education to patient about basic disease process related to Dementia  . Review of patient status, including review of consultants reports, relevant laboratory and other test results, and medications completed. . Reviewed medications with patient and discussed importance of medication adherence . Determined Mr. Faiola has been transferred to an inpatient skilled rehabilitation facility following a brief stay inpatient  . Determined [name] will collaborate with the skilled facility SW and or facility CM for concerns regarding Mr. Gullickson prescribed medication regimen . Discussed [name] will  have Mr. Hann discharged home once his discharge is determined, she plans to continue to provide his care and will notify the CCM team once his is home  Discussed with spouse [name] for ongoing care management follow up and provided patient with direct contact information for care management team Patient Self Care Activities:  - contact the CCM team for collaboration once Mr. Trejos is discharged home from inpatient rehab Patient/Caregiver Goal:  - continue to provide care for Mr. Almanza    Follow Up Plan: Telephone follow up appointment with care management team member scheduled for: 03/17/21    Plan:Telephone follow up appointment with care management team member scheduled for:  03/17/21  [name], RN, BSN, CCM Care Management Coordinator [facility]  Direct Phone: [phone]

## 2021-03-01 NOTE — Patient Instructions (Signed)
Goals Addressed    . Behavior Symptoms Managed       Timeframe:  Long-Range Goal Priority:  High Start Date: 10/15/20                            Expected End Date: 04/15/21  Next scheduled follow up: 03/17/21                  Patient Self Care Activities:  - contact the CCM team for collaboration once Dylan [name] is discharged home from inpatient rehab Patient/Caregiver Goal:  - continue to provide care for Dylan [name]

## 2021-03-02 ENCOUNTER — Telehealth: Payer: Self-pay

## 2021-03-02 NOTE — Telephone Encounter (Signed)
  Chronic Care Management   Outreach Note  03/02/2021 Name: Dylan [name]. MRN: [id number] DOB: 10-24-1941  Referred by: Jodi Marble, MD Reason for referral : Chronic Care Management   Inbound call received from patient spouse [name] who expresses concern with how the patient is being care for in rehab. Provided Mrs.Gilliam with contact number to the long term care ombudsman.    Daneen Schick, BSW, CDP Social Worker, Certified Dementia Practitioner [facility] [phone]

## 2021-03-05 ENCOUNTER — Ambulatory Visit: Payer: Medicare HMO

## 2021-03-05 DIAGNOSIS — E1122 Type 2 diabetes mellitus with diabetic chronic kidney disease: Secondary | ICD-10-CM

## 2021-03-05 DIAGNOSIS — G20 Parkinson's disease: Secondary | ICD-10-CM

## 2021-03-05 DIAGNOSIS — N1831 Chronic kidney disease, stage 3a: Secondary | ICD-10-CM

## 2021-03-05 DIAGNOSIS — N183 Chronic kidney disease, stage 3 unspecified: Secondary | ICD-10-CM

## 2021-03-05 DIAGNOSIS — F0391 Unspecified dementia with behavioral disturbance: Secondary | ICD-10-CM

## 2021-03-05 NOTE — Chronic Care Management (AMB) (Signed)
Chronic Care Management    Social Work Note  03/05/2021 Name: Dylan [name]. MRN: [id number] DOB: [birth year]  Dylan Fly. is a 80 y.o. year old male who is a primary care patient of [name], MD. The CCM team was consulted to assist the patient with chronic disease management and/or care coordination needs related to: Level of Care Concerns.   Contacted patient spouse by phone for follow up visit in response to provider referral for social work chronic care management and care coordination services.   Consent to Services:  The patient was given information about Chronic Care Management services, agreed to services, and gave verbal consent prior to initiation of services.  Please see initial visit note for detailed documentation.   Patient agreed to services and consent obtained.   Assessment: Review of patient past medical history, allergies, medications, and health status, including review of relevant consultants reports was performed today as part of a comprehensive evaluation and provision of chronic care management and care coordination services.     SDOH (Social Determinants of Health) assessments and interventions performed:    Advanced Directives Status: Not addressed in this encounter.  CCM Care Plan  Allergies  Allergen Reactions  . Quetiapine Other (See Comments)    Agitation     Outpatient Encounter Medications as of 03/05/2021  Medication Sig Note  . apixaban (ELIQUIS) 5 MG TABS tablet Take 1 tablet (5 mg total) by mouth 2 (two) times daily.   . Ascorbic Acid (VITAMIN C) 1000 MG tablet Take 1,000 mg by mouth every morning.    . carbidopa-levodopa (SINEMET IR) 25-100 MG tablet Take 2 tablets by mouth 3 (three) times daily. (Patient taking differently: Take 1 tablet by mouth See admin instructions. Take 1 tablet by mouth at 10 AM and 7 PM)   . carvedilol (COREG) 12.5 MG tablet TAKE 1 TABLET BY MOUTH 2 TIMES DAILY. (Patient taking differently: Take 12.5 mg by  mouth 2 (two) times daily with a meal.)   . Cholecalciferol (VITAMIN D3) 5000 units CAPS Take 5,000 Units by mouth at bedtime.   [name] desonide (DESOWEN) 0.05 % lotion Apply topically 2 times per day prn (Patient taking differently: Apply 1 application topically 2 (two) times daily as needed (for irritation- to affected areas of the face).)   . diclofenac Sodium (VOLTAREN) 1 % GEL Apply 2 g topically 3 (three) times daily. (Patient taking differently: Apply 2 g topically as needed (to painful sites).)   . 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 Glycerin (OASIS TEARS PF OP) Place 1 drop into both eyes in the morning.   Elmore Guise Devices (ONE TOUCH DELICA LANCING DEV) MISC Please fill ONE TOUCH DELICA LANCING DEVICE.  Use to test blood sugar twice daily as directed. E11.65 (Patient taking differently: Please fill ONE TOUCH DELICA LANCING DEVICE.  Use to test blood sugar twice daily as directed. E11.65)   . Lancets MISC Please fill basic/generic push button lancets.  Patient to test blood sugar twice daily. DX: E11.65   . linaclotide (LINZESS) 72 MCG capsule Take 1 capsule (72 mcg total) by mouth daily before breakfast. (Patient not taking: No sig reported)   . Melatonin 10 MG TABS Take 10-20 mg by mouth at bedtime as needed (for sleep).   . memantine (NAMENDA) 10 MG tablet TAKE 1 TABLET BY MOUTH TWICE A DAY (Patient taking differently: Take 10 mg by mouth 2 (two) times daily.)   .  metFORMIN (GLUCOPHAGE) 1000 MG tablet Take 1 tablet by mouth daily (Patient taking differently: Take 1,000 mg by mouth daily with breakfast.)   . pravastatin (PRAVACHOL) 20 MG tablet TAKE 1 TABLET BY ORAL ROUTE EVERY DAY (Patient taking differently: Take 20 mg by mouth daily.)   . QUEtiapine (SEROQUEL) 25 MG tablet TAKE 1 TABLET (25 MG TOTAL) BY MOUTH AT BEDTIME AS NEEDED. 02/10/2021: The patient's wife said she has been holding this med, as of late, because it seems to  make him "agitated- he becomes worse."  . rivastigmine (EXELON) 4.6 mg/24hr Place 1 patch (4.6 mg total) onto the skin daily. (Patient taking differently: Place 4.6 mg onto the skin every evening.)   . senna-docusate (SENOKOT-S) 8.6-50 MG tablet Take 1 tablet by mouth every other day.   . TRULICITY 1.5 XT/0.2IO SOPN INJECT 1.5 MG INTO THE SKIN ONCE A WEEK. (Patient taking differently: Inject 1.5 mg into the skin every Monday.)    No facility-administered encounter medications on file as of 03/05/2021.    Patient Active Problem List   Diagnosis Date Noted  . TIA (transient ischemic attack) 02/10/2021  . Acute encephalopathy 02/10/2021  . Chronic constipation 12/24/2020  . Parkinson's disease ([place]) 12/24/2020  . Encounter for care of pacemaker 07/11/2020  . 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 Neurogenic orthostatic hypotension ([place]) 06/08/2020  . DNR (do not resuscitate) 06/08/2020  . Elevated CK 04/27/2020  . Gout   . Hyperlipemia   . Parkinsonism ([place]) 01/29/2020  . 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 Dementia with behavioral disturbance ([place]) 05/22/2019  . Gait abnormality 05/22/2019  . AKI (acute kidney injury) ([place]) 07/14/2018  . Nephropathy 07/14/2018  . Other disorders of lung 07/14/2018  . Atherosclerosis of aorta ([place]) 07/14/2018  . Dehydration 07/14/2018  . 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 CKD (chronic kidney disease), stage III ([place]) 06/13/2018  . Syncope 06/13/2018  . 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 Peripheral arterial disease ([place]) 05/13/2014  . Essential hypertension 05/13/2014  . Type 2 diabetes mellitus with stage 3a chronic kidney disease, without long-term current use of insulin ([place]) 05/13/2014    Conditions to be addressed/monitored: DMII, CKD Stage III, Dementia and Parkinsons; ADL IADL limitations and Limited access to  caregiver  Care Plan : Social Work Care Plan  Updates made by Daneen Schick since 03/05/2021 12:00 AM    Problem: Disease Progression     Long-Range Goal: Disease Progression Managed with Care Coordination   Start Date: 12/21/2020  Expected End Date: 04/20/2021  Recent Progress: On track  Priority: High  Note:   Current Barriers:  . Level of care concerns . Recent Diagnosis of Prostate Cancer  . Chronic conditions including DM II, CKD III, Dementia, and Parkinson's disease which put patient at increased risk of hospitaliation . Unable to perform ADLs independently . Unable to perform IADLs independently  Social Work Clinical Goal(s):  [name] Over the next 120 days the patient and his spouse, Dylan [name], will work with SW to address care coordination needs related to disease progression  Interventions: . 1:1 collaboration with [name], MD regarding development and update of comprehensive plan of care as evidenced by provider attestation and co-signature . Inter-disciplinary care team collaboration (see longitudinal plan of care) . Successful outbound call placed to Dylan [name] to follow up on patients progression in rehab . Discussed Dylan [name] has spoken with  a long term care Ombudsman to express her concerns surrounding the care patient is receiving in rehab . Dylan [name] reports she has also spoken with the administrator at [facility] who has committed to making sure staff get the patient up and out of bed daily each morning . Determined Dylan [name] has the Ombudsman contact number and will contact her as needed with future concerns . Scheduled follow up call over the next 21 days  Patient Goals/Self-Care Activities Over the next 15 days, patient will: With the help of his caregiver Dylan [name]  - Patient will self administer medications as prescribed Patient will attend all scheduled provider appointments Patient will call provider office for new concerns or  questions Contact SW as needed prior to next scheduled call Engage with Phillips Hay to complete screening for adult day program funding Engage with therapy team to improve strength and mobility  Follow up Plan: SW will follow up with patient by phone over the next 21 days       Follow Up Plan: SW will follow up with patient by phone over the next 21 days      Daneen Schick, BSW, CDP Social Worker, Certified Dementia Practitioner [place] [facility] [place] [facility] [phone]  Total time spent performing care coordination and/or care management activities with the patient by phone or face to face = 18 minutes.

## 2021-03-05 NOTE — Patient Instructions (Signed)
Social Worker Visit Information  Goals we discussed today:  Goals Addressed            This Visit's Progress   . Disease Progression Managed with Care Coordination       Timeframe:  Long-Range Goal Priority:  High Start Date:  2.21.22                           Expected End Date: 6.21.22                       Next planned outreach: 5.27.22  Patient Goals/Self-Care Activities Over the next 30 days, patient will: With the help of his caregiver [name]  - Patient will self administer medications as prescribed Patient will attend all scheduled provider appointments Patient will call provider office for new concerns or questions Contact SW as needed prior to next scheduled call Engage with Phillips Hay to complete screening for adult day program funding Engage with therapy team to improve strength and mobility        Follow Up Plan: SW will follow up with patient by phone over the next 21 days   Daneen Schick, BSW, CDP Social Worker, Certified Dementia Practitioner [place] [facility] [place] [facility] [phone]

## 2021-03-10 ENCOUNTER — Non-Acute Institutional Stay: Payer: Medicare HMO | Admitting: Student

## 2021-03-10 ENCOUNTER — Other Ambulatory Visit: Payer: Self-pay

## 2021-03-10 DIAGNOSIS — R531 Weakness: Secondary | ICD-10-CM

## 2021-03-10 DIAGNOSIS — G20 Parkinson's disease: Secondary | ICD-10-CM

## 2021-03-10 DIAGNOSIS — U071 COVID-19: Secondary | ICD-10-CM

## 2021-03-10 DIAGNOSIS — Z515 Encounter for palliative care: Secondary | ICD-10-CM

## 2021-03-10 NOTE — Progress Notes (Signed)
[facility] Consult Note Telephone: [phone]  Fax: [phone]    Date of encounter: 03/10/21 PATIENT NAME: Dylan [name]. [address] [address]   [phone] (home)  DOB: 1941-10-21 MRN: [id number] PRIMARY CARE PROVIDER:    [name], MD,  [address] [place] [address] [place] [address] [phone]  REFERRING PROVIDER:   Vennie Homans, NP  RESPONSIBLE PARTY:    Contact Information    Name Relation Home Work [place] Spouse [phone]  [phone]   [address] Tawni Pummel [phone]         I met face to face with patient in the facility. Palliative Care was asked to follow this patient by consultation request of  Vennie Homans, NP to address advance care planning and complex medical decision making. This is a follow up visit.                                   ASSESSMENT AND PLAN / RECOMMENDATIONS:   Advance Care Planning/Goals of Care: Goals include to maximize quality of life and symptom management. Our advance care planning conversation included a discussion about:     The value and importance of advance care planning   Experiences with loved ones who have been seriously ill or have died   Exploration of personal, cultural or spiritual beliefs that might influence medical decisions   Exploration of goals of care in the event of a sudden injury or illness   Identification and preparation of a healthcare agent   CODE STATUS: Full Code  Palliative NP attempting to reach spouse to discuss goals of care, code status as patient was previously a DNR at home. He is a Full Code per facility.  Symptom Management/Plan:  Generalized weakness-due to Parkinson's disease, dementia and current Covid-19 infection. Staff to continue assisting with all adl's. Turn and reposition every 2 hours. Recommend therapy resumption once off isolation.   Parkinson's disease, dementia-Assist  with adl's. Reorient/redirect as needed. Continue carbidopa-levodopa 25-100 two tabs BID, donepezil 24m daily, Namenda 150mBID.  Covid-19 infection-staff to continue assisting with adl's, feeding. Continue isolation per facility policy. Routine VS per facility, assess lung sounds routinely per facility directions.    Follow up Palliative Care Visit: Palliative care will continue to follow for complex medical decision making, advance care planning, and clarification of goals. Return in 4 weeks or prn.  I spent 35 minutes providing this consultation. More than 50% of the time in this consultation was spent in counseling and care coordination.    PPS: 30%  HOSPICE ELIGIBILITY/DIAGNOSIS: TBD  Chief Complaint: Palliative Medicine follow up visit; parkinson's disease.  HISTORY OF PRESENT ILLNESS:  Dylan [name] a [age].o. year old male  with Parkinson's disease, dementia, hypertension, DM, hx of CVA . Patient was hospitalized 4/12-4/15/22 due to vasovagal syncope, stroke like symptoms, CVA ruled out. Completed heart block status post pacemaker. New onset of paroxysmal atrial fibrillation.  [facility]; he was previously followed by Palliative Medicine in the home. He was found to be positive for Covid-19 on 03/08/2021. He is currently on Covid unit. He denies pain, shortness of breath, diarrhea. Staff reports patient doing okay. He is dependent for all adl's. He had been primarily bed bound prior to hospitalization. He does have therapy orders, but currently on  hold due to Covid-19 infection.   Patient is received resting in bed. NAD. He does respond to direct questions. He states he is hungry. Staff report patient eating all of his breakfast, but did not like what was served for lunch. He is fed by staff.    History obtained from review of EMR, discussion with primary team, and interview with family, facility  staff/caregiver and/or Dylan [name].  I reviewed available labs, medications, imaging, studies and related documents from the EMR.  Records reviewed and summarized above.   ROS  Unable to obtain due to impaired cognition.   Physical Exam: Weight: 147 pounds Constitutional: NAD General: frail appearing  EYES: anicteric sclera, lids intact, no discharge  ENMT: intact hearing, oral mucous membranes dry CV: S1S2, RRR, no LE edema Pulmonary: LCTA, no increased work of breathing, no cough, room air Abdomen: normo-active BS + 4 quadrants, soft and non tender GU: deferred MSK: non-ambulatory Skin: warm and dry, no rashes or wounds on visible skin Neuro: generalized weakness Psych: non-anxious affect, A & O Hem/lymph/immuno: no widespread bruising   Thank you for the opportunity to participate in the care of Dylan [name].  The palliative care team will continue to follow. Please call our office at [phone] if we can be of additional assistance.   Ezekiel Slocumb, NP   COVID-19 PATIENT SCREENING TOOL Asked and negative response unless otherwise noted:   Have you had symptoms of covid, tested positive or been in contact with someone with symptoms/positive test in the past 5-10 days? Yes

## 2021-03-11 ENCOUNTER — Telehealth: Payer: Self-pay | Admitting: Student

## 2021-03-11 NOTE — Telephone Encounter (Signed)
Palliative NP received return call from spouse. Updated her on Palliative visit. She states she would like for patient to return home if he is able to ambulate again. PT is currently on hold due to being in Covid isolation. If unable to return home, she would like for patient to be moved to a facility closer to her home. She also clarifies that patient should be a Full Code. Palliative Medicine will continue to follow patient and provide support.

## 2021-03-11 NOTE — Telephone Encounter (Signed)
Palliative NP left message for spouse regarding Palliative visit; awaiting return call.

## 2021-03-17 ENCOUNTER — Ambulatory Visit (INDEPENDENT_AMBULATORY_CARE_PROVIDER_SITE_OTHER): Payer: Medicare HMO

## 2021-03-17 ENCOUNTER — Telehealth: Payer: Medicare HMO

## 2021-03-17 DIAGNOSIS — N183 Chronic kidney disease, stage 3 unspecified: Secondary | ICD-10-CM | POA: Diagnosis not present

## 2021-03-17 DIAGNOSIS — F0391 Unspecified dementia with behavioral disturbance: Secondary | ICD-10-CM

## 2021-03-17 DIAGNOSIS — G20 Parkinson's disease: Secondary | ICD-10-CM

## 2021-03-17 DIAGNOSIS — E1122 Type 2 diabetes mellitus with diabetic chronic kidney disease: Secondary | ICD-10-CM

## 2021-03-17 DIAGNOSIS — N1831 Chronic kidney disease, stage 3a: Secondary | ICD-10-CM

## 2021-03-17 DIAGNOSIS — I10 Essential (primary) hypertension: Secondary | ICD-10-CM | POA: Diagnosis not present

## 2021-03-19 ENCOUNTER — Telehealth: Payer: Self-pay | Admitting: Student

## 2021-03-19 NOTE — Telephone Encounter (Signed)
Palliative NP has called [facility] several times; unable to speak with nurse regarding exelon patch. Wife updated. She is going to attempt to speak with staff when she visits today.

## 2021-03-19 NOTE — Telephone Encounter (Signed)
Palliative NP received call from patient's significant other. She states she was a facility yesterday and patient is not receiving his Exelon patch. NP will check with facility to see if it was discontinued.

## 2021-03-25 NOTE — Patient Instructions (Signed)
Goals Addressed    . Fall prevention   On track    Timeframe:  Long-Range Goal Priority:  High Start Date: 10/15/20                            Expected End Date:  10/15/21                  Next follow up date: 05/14/21  - Utilize his walker (assistive device) appropriately with all ambulation - De-clutter walkways - Change positions slowly - Wear secure fitting shoes at all times with ambulation - Utilize home lighting for dim lit areas - Demonstrate self and pet awareness at all times

## 2021-03-25 NOTE — Chronic Care Management (AMB) (Signed)
Chronic Care Management   CCM RN Visit Note  03/25/2021 Name: Dylan [name]. MRN: [id number] DOB: [birth year]  Subjective: Dylan [name]. is a 80 y.o. year old male who is a primary care patient of [name], MD. The care management team was consulted for assistance with disease management and care coordination needs.    Engaged with patient by telephone for follow up visit in response to provider referral for case management and/or care coordination services.   Consent to Services:  The patient was given information about Chronic Care Management services, agreed to services, and gave verbal consent prior to initiation of services.  Please see initial visit note for detailed documentation.   Patient agreed to services and verbal consent obtained.   Assessment: Review of patient past medical history, allergies, medications, health status, including review of consultants reports, laboratory and other test data, was performed as part of comprehensive evaluation and provision of chronic care management services.   SDOH (Social Determinants of Health) assessments and interventions performed: Yes, see care plan    CCM Care Plan  Allergies  Allergen Reactions  . Quetiapine Other (See Comments)    Agitation     Outpatient Encounter Medications as of 03/17/2021  Medication Sig Note  . apixaban (ELIQUIS) 5 MG TABS tablet Take 1 tablet (5 mg total) by mouth 2 (two) times daily.   . Ascorbic Acid (VITAMIN C) 1000 MG tablet Take 1,000 mg by mouth every morning.    . carbidopa-levodopa (SINEMET IR) 25-100 MG tablet Take 2 tablets by mouth 3 (three) times daily. (Patient taking differently: Take 1 tablet by mouth See admin instructions. Take 1 tablet by mouth at 10 AM and 7 PM)   . carvedilol (COREG) 12.5 MG tablet TAKE 1 TABLET BY MOUTH 2 TIMES DAILY. (Patient taking differently: Take 12.5 mg by mouth 2 (two) times daily with a meal.)   . Cholecalciferol (VITAMIN D3) 5000 units CAPS  Take 5,000 Units by mouth at bedtime.   [name] desonide (DESOWEN) 0.05 % lotion Apply topically 2 times per day prn (Patient taking differently: Apply 1 application topically 2 (two) times daily as needed (for irritation- to affected areas of the face).)   . diclofenac Sodium (VOLTAREN) 1 % GEL Apply 2 g topically 3 (three) times daily. (Patient taking differently: Apply 2 g topically as needed (to painful sites).)   . 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 Glycerin (OASIS TEARS PF OP) Place 1 drop into both eyes in the morning.   Elmore Guise Devices (ONE TOUCH DELICA LANCING DEV) MISC Please fill ONE TOUCH DELICA LANCING DEVICE.  Use to test blood sugar twice daily as directed. E11.65 (Patient taking differently: Please fill ONE TOUCH DELICA LANCING DEVICE.  Use to test blood sugar twice daily as directed. E11.65)   . Lancets MISC Please fill basic/generic push button lancets.  Patient to test blood sugar twice daily. DX: E11.65   . linaclotide (LINZESS) 72 MCG capsule Take 1 capsule (72 mcg total) by mouth daily before breakfast. (Patient not taking: No sig reported)   . Melatonin 10 MG TABS Take 10-20 mg by mouth at bedtime as needed (for sleep).   . memantine (NAMENDA) 10 MG tablet TAKE 1 TABLET BY MOUTH TWICE A DAY (Patient taking differently: Take 10 mg by mouth 2 (two) times daily.)   . metFORMIN (GLUCOPHAGE) 1000 MG tablet Take 1 tablet by mouth daily (Patient taking differently:  Take 1,000 mg by mouth daily with breakfast.)   . pravastatin (PRAVACHOL) 20 MG tablet TAKE 1 TABLET BY ORAL ROUTE EVERY DAY (Patient taking differently: Take 20 mg by mouth daily.)   . QUEtiapine (SEROQUEL) 25 MG tablet TAKE 1 TABLET (25 MG TOTAL) BY MOUTH AT BEDTIME AS NEEDED. 02/10/2021: The patient's wife said she has been holding this med, as of late, because it seems to make him "agitated- he becomes worse."  . rivastigmine (EXELON) 4.6 mg/24hr Place 1 patch  (4.6 mg total) onto the skin daily. (Patient taking differently: Place 4.6 mg onto the skin every evening.)   . senna-docusate (SENOKOT-S) 8.6-50 MG tablet Take 1 tablet by mouth every other day.   . TRULICITY 1.5 OA/4.1YS SOPN INJECT 1.5 MG INTO THE SKIN ONCE A WEEK. (Patient taking differently: Inject 1.5 mg into the skin every Monday.)    No facility-administered encounter medications on file as of 03/17/2021.    Patient Active Problem List   Diagnosis Date Noted  . TIA (transient ischemic attack) 02/10/2021  . Acute encephalopathy 02/10/2021  . Chronic constipation 12/24/2020  . Parkinson's disease ([place]) 12/24/2020  . Encounter for care of pacemaker 07/11/2020  . 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 Neurogenic orthostatic hypotension ([place]) 06/08/2020  . DNR (do not resuscitate) 06/08/2020  . Elevated CK 04/27/2020  . Gout   . Hyperlipemia   . Parkinsonism ([place]) 01/29/2020  . 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 Dementia with behavioral disturbance ([place]) 05/22/2019  . Gait abnormality 05/22/2019  . AKI (acute kidney injury) ([place]) 07/14/2018  . Nephropathy 07/14/2018  . Other disorders of lung 07/14/2018  . Atherosclerosis of aorta ([place]) 07/14/2018  . Dehydration 07/14/2018  . 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 CKD (chronic kidney disease), stage III ([place]) 06/13/2018  . Syncope 06/13/2018  . 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 Peripheral arterial disease ([place]) 05/13/2014  . Essential hypertension 05/13/2014  . Type 2 diabetes mellitus with stage 3a chronic kidney disease, without long-term current use of insulin ([place]) 05/13/2014    Conditions to be addressed/monitored:Type II DM, non-insulin dependent, CKD, stage III, Essential hypertension, Dementia, Parkinsonism, unspecified  Care Plan : Fall Risk (Adult)  Updates made by Lynne Logan, RN since  03/25/2021 12:00 AM    Problem: Fall Risk   Priority: High    Long-Range Goal: Absence of Fall and Fall-Related Injury   Start Date: 10/15/2020  Expected End Date: 10/15/2021  Recent Progress: On track  Priority: High  Note:   Current Barriers:  [name] Knowledge Deficits related to fall precautions in patient with Type II DM, non-insulin dependent, CKD, stage III, Essential hypertension, Dementia, Parkinsonism, unspecified . Decreased adherence to prescribed treatment for fall prevention . Unable to self administer medications as prescribed . Lacks social connections . Unable to perform ADLs independently . Unable to perform IADLs independently . Chronic Disease Management support and education needs related to Type II DM, non-insulin dependent, CKD, stage III, Essential hypertension, Dementia, Parkinsonism, unspecified . Cognitive Deficits Clinical Goal(s):  [name] Patient/caregiver will demonstrate improved adherence to prescribed treatment plan for decreasing falls as evidenced by patient reporting and review of EMR . Patient/caregiver will verbalize using fall risk reduction strategies discussed Interventions:  03/17/21 completed successful outbound call with patient's caregiver [name] . Collaboration with [name], MD regarding development and update of comprehensive plan of care as evidenced by  provider attestation and co-signature . Inter-disciplinary care team collaboration (see longitudinal plan of care) . Provided education to patient about basic disease process related to Fall risk  . Assessed for falls since last encounter . Determined patient continues to be inpatient at [facility] and has experienced increased falls while inpatient  . Reviewed medications and discussed potential side effects of medications such as dizziness and frequent urination . Assessed caregivers knowledge of fall risk prevention secondary to previously provided  education . Developed a fall prevention plan with the caregiver upon patient discharge home . Discussed plans with patient for ongoing care management follow up and provided patient with direct contact information for care management team Patient Goals/Self-Care Activities - Utilize his walker (assistive device) appropriately with all ambulation - De-clutter walkways - Change positions slowly - Wear secure fitting shoes at all times with ambulation - Utilize home lighting for dim lit areas - Demonstrate self and pet awareness at all times - report any/all falls to PCP promptly  Follow Up Plan: Telephone follow up appointment with care management team member scheduled for:  05/14/21    Plan:Telephone follow up appointment with care management team member scheduled for:  05/14/21  [name], RN, BSN, CCM Care Management Coordinator [place] [facility]  Direct Phone: [phone]

## 2021-03-26 ENCOUNTER — Telehealth: Payer: Medicare HMO

## 2021-04-07 ENCOUNTER — Ambulatory Visit (INDEPENDENT_AMBULATORY_CARE_PROVIDER_SITE_OTHER): Payer: Medicare HMO

## 2021-04-07 DIAGNOSIS — F0391 Unspecified dementia with behavioral disturbance: Secondary | ICD-10-CM

## 2021-04-07 DIAGNOSIS — N183 Chronic kidney disease, stage 3 unspecified: Secondary | ICD-10-CM | POA: Diagnosis not present

## 2021-04-07 DIAGNOSIS — E1122 Type 2 diabetes mellitus with diabetic chronic kidney disease: Secondary | ICD-10-CM | POA: Diagnosis not present

## 2021-04-07 DIAGNOSIS — N1831 Chronic kidney disease, stage 3a: Secondary | ICD-10-CM | POA: Diagnosis not present

## 2021-04-07 DIAGNOSIS — G20 Parkinson's disease: Secondary | ICD-10-CM

## 2021-04-07 NOTE — Patient Instructions (Signed)
Social Worker Visit Information  Goals we discussed today:  Goals Addressed            This Visit's Progress   . Disease Progression Managed with Care Coordination       Timeframe:  Long-Range Goal Priority:  Low Start Date:  2.21.22                           Expected End Date: 9.16.22                       Next planned outreach: 8.8.22  Patient Goals/Self-Care Activities patient will: With the help of his caregiver [name]  - Contact SW as needed prior to next scheduled call -Engage with therapy team to improve strength and mobility       Follow Up Plan: SW will follow up with patient by phone over the next 60 days   Daneen Schick, BSW, CDP Social Worker, Certified Dementia Practitioner [place] [facility] [place] [facility] [phone]

## 2021-04-07 NOTE — Chronic Care Management (AMB) (Signed)
Chronic Care Management    Social Work Note  04/07/2021 Name: Dylan [name]. MRN: [id number] DOB: [birth year]  Boris Engelmann. is a 80 y.o. year old male who is a primary care patient of [name], MD. The CCM team was consulted to assist the patient with chronic disease management and/or care coordination needs related to: Level of Care Concerns.   Engaged with patient spouse by phone for follow up visit in response to provider referral for social work chronic care management and care coordination services.   Consent to Services:  The patient was given information about Chronic Care Management services, agreed to services, and gave verbal consent prior to initiation of services.  Please see initial visit note for detailed documentation.   Patient agreed to services and consent obtained.   Assessment: Review of patient past medical history, allergies, medications, and health status, including review of relevant consultants reports was performed today as part of a comprehensive evaluation and provision of chronic care management and care coordination services.     SDOH (Social Determinants of Health) assessments and interventions performed:    Advanced Directives Status: Not addressed in this encounter.  CCM Care Plan  Allergies  Allergen Reactions  . Quetiapine Other (See Comments)    Agitation     Outpatient Encounter Medications as of 04/07/2021  Medication Sig Note  . apixaban (ELIQUIS) 5 MG TABS tablet Take 1 tablet (5 mg total) by mouth 2 (two) times daily.   . Ascorbic Acid (VITAMIN C) 1000 MG tablet Take 1,000 mg by mouth every morning.    . carbidopa-levodopa (SINEMET IR) 25-100 MG tablet Take 2 tablets by mouth 3 (three) times daily. (Patient taking differently: Take 1 tablet by mouth See admin instructions. Take 1 tablet by mouth at 10 AM and 7 PM)   . carvedilol (COREG) 12.5 MG tablet TAKE 1 TABLET BY MOUTH 2 TIMES DAILY. (Patient taking differently: Take 12.5 mg  by mouth 2 (two) times daily with a meal.)   . Cholecalciferol (VITAMIN D3) 5000 units CAPS Take 5,000 Units by mouth at bedtime.   [name] desonide (DESOWEN) 0.05 % lotion Apply topically 2 times per day prn (Patient taking differently: Apply 1 application topically 2 (two) times daily as needed (for irritation- to affected areas of the face).)   . diclofenac Sodium (VOLTAREN) 1 % GEL Apply 2 g topically 3 (three) times daily. (Patient taking differently: Apply 2 g topically as needed (to painful sites).)   . 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 Glycerin (OASIS TEARS PF OP) Place 1 drop into both eyes in the morning.   Elmore Guise Devices (ONE TOUCH DELICA LANCING DEV) MISC Please fill ONE TOUCH DELICA LANCING DEVICE.  Use to test blood sugar twice daily as directed. E11.65 (Patient taking differently: Please fill ONE TOUCH DELICA LANCING DEVICE.  Use to test blood sugar twice daily as directed. E11.65)   . Lancets MISC Please fill basic/generic push button lancets.  Patient to test blood sugar twice daily. DX: E11.65   . linaclotide (LINZESS) 72 MCG capsule Take 1 capsule (72 mcg total) by mouth daily before breakfast. (Patient not taking: No sig reported)   . Melatonin 10 MG TABS Take 10-20 mg by mouth at bedtime as needed (for sleep).   . memantine (NAMENDA) 10 MG tablet TAKE 1 TABLET BY MOUTH TWICE A DAY (Patient taking differently: Take 10 mg by mouth 2 (two) times  daily.)   . metFORMIN (GLUCOPHAGE) 1000 MG tablet Take 1 tablet by mouth daily (Patient taking differently: Take 1,000 mg by mouth daily with breakfast.)   . pravastatin (PRAVACHOL) 20 MG tablet TAKE 1 TABLET BY ORAL ROUTE EVERY DAY (Patient taking differently: Take 20 mg by mouth daily.)   . QUEtiapine (SEROQUEL) 25 MG tablet TAKE 1 TABLET (25 MG TOTAL) BY MOUTH AT BEDTIME AS NEEDED. 02/10/2021: The patient's wife said she has been holding this med, as of late, because it seems to  make him "agitated- he becomes worse."  . rivastigmine (EXELON) 4.6 mg/24hr Place 1 patch (4.6 mg total) onto the skin daily. (Patient taking differently: Place 4.6 mg onto the skin every evening.)   . senna-docusate (SENOKOT-S) 8.6-50 MG tablet Take 1 tablet by mouth every other day.   . TRULICITY 1.5 JK/9.3OI SOPN INJECT 1.5 MG INTO THE SKIN ONCE A WEEK. (Patient taking differently: Inject 1.5 mg into the skin every Monday.)    No facility-administered encounter medications on file as of 04/07/2021.    Patient Active Problem List   Diagnosis Date Noted  . TIA (transient ischemic attack) 02/10/2021  . Acute encephalopathy 02/10/2021  . Chronic constipation 12/24/2020  . Parkinson's disease ([place]) 12/24/2020  . Encounter for care of pacemaker 07/11/2020  . 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 Neurogenic orthostatic hypotension ([place]) 06/08/2020  . DNR (do not resuscitate) 06/08/2020  . Elevated CK 04/27/2020  . Gout   . Hyperlipemia   . Parkinsonism ([place]) 01/29/2020  . 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 Dementia with behavioral disturbance ([place]) 05/22/2019  . Gait abnormality 05/22/2019  . AKI (acute kidney injury) ([place]) 07/14/2018  . Nephropathy 07/14/2018  . Other disorders of lung 07/14/2018  . Atherosclerosis of aorta ([place]) 07/14/2018  . Dehydration 07/14/2018  . 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 CKD (chronic kidney disease), stage III ([place]) 06/13/2018  . Syncope 06/13/2018  . 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 Peripheral arterial disease ([place]) 05/13/2014  . Essential hypertension 05/13/2014  . Type 2 diabetes mellitus with stage 3a chronic kidney disease, without long-term current use of insulin ([place]) 05/13/2014    Conditions to be addressed/monitored: DM II, CKD III, Parkinsonism, Dementia; Level of care concerns  Care Plan : Social Work Care  Plan  Updates made by Daneen Schick since 04/07/2021 12:00 AM    Problem: Disease Progression     Long-Range Goal: Disease Progression Managed with Care Coordination   Start Date: 12/21/2020  Expected End Date: 07/06/2021  This Visit's Progress: On track  Recent Progress: On track  Priority: Low  Note:   Current Barriers:  . Level of care concerns . Recent Diagnosis of Prostate Cancer  . Chronic conditions including DM II, CKD III, Dementia, and Parkinson's disease which put patient at increased risk of hospitaliation . Unable to perform ADLs independently . Unable to perform IADLs independently  Social Work Clinical Goal(s):  .  the patient and his spouse, [name], will work with SW to address care coordination needs related to disease progression  Interventions: . 1:1 collaboration with [name], MD regarding development and update of comprehensive plan of care as evidenced by provider attestation and co-signature . Inter-disciplinary care team collaboration (see longitudinal plan of care) . Successful outbound call placed to [name] to follow up on patients progression in rehab . Discussed the patient remain at  [facility] - [name] reports the patient will remain in rehab "for a while" due to inability to ambulate . Determined the patient is continuing to participate with therapy and staff is getting patient out of bed daily . [name] reports the patient is no longer holding conversations and will only respond by saying "yes" or "no" . Discussed plans for SW to follow up over the next 60 days to assess for care coordination needs . Encouraged [name] to contact SW if assistance is needed prior to next scheduled call  Patient Goals/Self-Care Activities patient will: With the help of his caregiver [name]  -  Contact SW as needed prior to next scheduled call -Engage with therapy team to improve strength and mobility  Follow up Plan: SW  will follow up with patient by phone over the next 60 days       Follow Up Plan: SW will follow up with patient by phone over the next 60 days      Daneen Schick, BSW, CDP Social Worker, Certified Dementia Practitioner [place] [facility] [place] [facility] [phone]  Total time spent performing care coordination and/or care management activities with the patient by phone or face to face = 25 minutes.

## 2021-04-20 ENCOUNTER — Encounter: Payer: Self-pay | Admitting: Podiatry

## 2021-04-20 ENCOUNTER — Ambulatory Visit (INDEPENDENT_AMBULATORY_CARE_PROVIDER_SITE_OTHER): Payer: Medicare HMO | Admitting: *Deleted

## 2021-04-20 ENCOUNTER — Ambulatory Visit (INDEPENDENT_AMBULATORY_CARE_PROVIDER_SITE_OTHER): Payer: Medicare HMO | Admitting: Podiatry

## 2021-04-20 ENCOUNTER — Other Ambulatory Visit: Payer: Self-pay

## 2021-04-20 DIAGNOSIS — M79675 Pain in left toe(s): Secondary | ICD-10-CM | POA: Diagnosis not present

## 2021-04-20 DIAGNOSIS — M2012 Hallux valgus (acquired), left foot: Secondary | ICD-10-CM

## 2021-04-20 DIAGNOSIS — M79674 Pain in right toe(s): Secondary | ICD-10-CM | POA: Diagnosis not present

## 2021-04-20 DIAGNOSIS — E1151 Type 2 diabetes mellitus with diabetic peripheral angiopathy without gangrene: Secondary | ICD-10-CM | POA: Diagnosis not present

## 2021-04-20 DIAGNOSIS — B351 Tinea unguium: Secondary | ICD-10-CM | POA: Diagnosis not present

## 2021-04-20 NOTE — Progress Notes (Signed)
Patient presents today to pick up diabetic shoes and insoles.  Patient was dispensed 1 pair of diabetic shoes and 3 pairs of foam casted diabetic insoles. Fit was satisfactory. Instructions for break-in and wear was reviewed and a copy was given to the patient.   Re-appointment for regularly scheduled diabetic foot care visits or if they should experience any trouble with the shoes or insoles.  

## 2021-04-24 NOTE — Progress Notes (Signed)
  Subjective:  Patient ID: Dylan Ina., male    DOB: 02/24/1941,  MRN: [id number]  80 y.o. male presents at risk foot care. Pt has h/o NIDDM with chronic kidney disease and thick, elongated toenails b/l lower extremities which are tender when wearing enclosed shoe gear.  His spouse, Dylan [name], is present during today's visit. He is in [place] rehab facility presently after sustaining another fall.  PCP is [name], MD , and last visit was 11/03/2020.  Allergies  Allergen Reactions   Quetiapine Other (See Comments)    Agitation     Review of Systems: Negative except as noted in the HPI.   Objective:   Constitutional Pt is a pleasant 80 y.o. African American male in NAD. AAO x 3.   Vascular Capillary fill time to digits delayed b/l lower extremities. Faintly palpable pedal pulses b/l. Pedal hair absent. Lower extremity skin temperature gradient within normal limits. No pain with calf compression b/l. No edema noted b/l lower extremities.  Neurologic Protective sensation intact 5/5 intact bilaterally with 10g monofilament b/l.  Dermatologic Pedal skin with normal turgor, texture and tone b/l lower extremities No open wounds b/l lower extremities No interdigital macerations b/l lower extremities Toenails 1-5 b/l elongated, discolored, dystrophic, thickened, crumbly with subungual debris and tenderness to dorsal palpation.  Orthopedic: Noted disuse atrophy b/l LE. No pain crepitus or joint limitation noted with ROM b/l. No gross bony deformities bilaterally. Utilizes wheelchair for mobility assistance.   Radiographs: None Assessment:   1. Pain due to onychomycosis of toenails of both feet    Plan:  Patient was evaluated and treated and all questions answered.  Onychomycosis with pain -Nails palliatively debridement as below. -Educated on self-care  Procedure: Nail Debridement Rationale: Pain Type of Debridement: manual, sharp debridement. Instrumentation:  Nail nipper, rotary burr. Number of Nails: 10  -Examined patient. -Continue diabetic foot care principles. -Patient to continue soft, supportive shoe gear daily. -Toenails 1-5 b/l were debrided in length and girth with sterile nail nippers and dremel without iatrogenic bleeding.  -Patient to report any pedal injuries to medical professional immediately. -Patient/POA to call should there be question/concern in the interim.  Return in about 3 months (around 07/21/2021).  Marzetta Board, DPM

## 2021-05-05 ENCOUNTER — Ambulatory Visit (INDEPENDENT_AMBULATORY_CARE_PROVIDER_SITE_OTHER): Payer: Medicare HMO

## 2021-05-05 ENCOUNTER — Telehealth: Payer: Medicare HMO

## 2021-05-05 DIAGNOSIS — G20 Parkinson's disease: Secondary | ICD-10-CM

## 2021-05-05 DIAGNOSIS — I10 Essential (primary) hypertension: Secondary | ICD-10-CM

## 2021-05-05 DIAGNOSIS — F0391 Unspecified dementia with behavioral disturbance: Secondary | ICD-10-CM

## 2021-05-05 DIAGNOSIS — N183 Chronic kidney disease, stage 3 unspecified: Secondary | ICD-10-CM

## 2021-05-05 DIAGNOSIS — E1122 Type 2 diabetes mellitus with diabetic chronic kidney disease: Secondary | ICD-10-CM

## 2021-05-05 DIAGNOSIS — N1831 Chronic kidney disease, stage 3a: Secondary | ICD-10-CM

## 2021-05-11 NOTE — Patient Instructions (Signed)
Goals Addressed      Pressure Injury - complications prevented or minimized   On track    Timeframe:  Long-Range Goal Priority:  High Start Date: 05/05/21                            Expected End Date: 11/05/21  Next Scheduled Follow up date: 05/14/21          Self Care Activities:  Unable to independently perform any level of Self Care Patient Goals: - Pressure injury - complications prevented or minimized

## 2021-05-11 NOTE — Chronic Care Management (AMB) (Signed)
Chronic Care Management   CCM RN Visit Note  05/05/2021 Name: Dylan [name]. MRN: [id number] DOB: 1941/01/02  Subjective: Dylan [name]. is a 80 y.o. year old male who is a primary care patient of Dylan Chard, MD. The care management team was consulted for assistance with disease management and care coordination needs.    Engaged with patient by telephone for follow up visit in response to provider referral for case management and/or care coordination services.   Consent to Services:  The patient was given information about Chronic Care Management services, agreed to services, and gave verbal consent prior to initiation of services.  Please see initial visit note for detailed documentation.   Patient agreed to services and verbal consent obtained.   Assessment: Review of patient past medical history, allergies, medications, health status, including review of consultants reports, laboratory and other test data, was performed as part of comprehensive evaluation and provision of chronic care management services.   SDOH (Social Determinants of Health) assessments and interventions performed: Yes, see care plan   CCM Care Plan  Allergies  Allergen Reactions   Quetiapine Other (See Comments)    Agitation     Outpatient Encounter Medications as of 05/05/2021  Medication Sig Note   apixaban (ELIQUIS) 5 MG TABS tablet Take 1 tablet (5 mg total) by mouth 2 (two) times daily.    Ascorbic Acid (VITAMIN C) 1000 MG tablet Take 1,000 mg by mouth every morning.     carbidopa-levodopa (SINEMET IR) 25-100 MG tablet Take 2 tablets by mouth 3 (three) times daily. (Patient taking differently: Take 1 tablet by mouth See admin instructions. Take 1 tablet by mouth at 10 AM and 7 PM)    carvedilol (COREG) 12.5 MG tablet TAKE 1 TABLET BY MOUTH 2 TIMES DAILY. (Patient taking differently: Take 12.5 mg by mouth 2 (two) times daily with a meal.)    Cholecalciferol (VITAMIN D3) 5000 units CAPS Take 5,000  Units by mouth at bedtime.    desonide (DESOWEN) 0.05 % lotion Apply topically 2 times per day prn (Patient taking differently: Apply 1 application topically 2 (two) times daily as needed (for irritation- to affected areas of the face).)    diclofenac Sodium (VOLTAREN) 1 % GEL Apply 2 g topically 3 (three) times daily. (Patient taking differently: Apply 2 g topically as needed (to painful sites).)    glucose blood (ONETOUCH VERIO) test strip Use as instructed to check blood sugars daily dx: e11.22    Glycerin (OASIS TEARS PF OP) Place 1 drop into both eyes in the morning.    Lancet Devices (ONE TOUCH DELICA LANCING DEV) MISC Please fill ONE TOUCH DELICA LANCING DEVICE.  Use to test blood sugar twice daily as directed. E11.65 (Patient taking differently: Please fill ONE TOUCH DELICA LANCING DEVICE.  Use to test blood sugar twice daily as directed. E11.65)    Lancets MISC Please fill basic/generic push button lancets.  Patient to test blood sugar twice daily. DX: E11.65    linaclotide (LINZESS) 72 MCG capsule Take 1 capsule (72 mcg total) by mouth daily before breakfast. (Patient not taking: No sig reported)    Melatonin 10 MG TABS Take 10-20 mg by mouth at bedtime as needed (for sleep).    memantine (NAMENDA) 10 MG tablet TAKE 1 TABLET BY MOUTH TWICE A DAY (Patient taking differently: Take 10 mg by mouth 2 (two) times daily.)    metFORMIN (GLUCOPHAGE) 1000 MG tablet Take 1 tablet by mouth daily (Patient taking differently: Take  1,000 mg by mouth daily with breakfast.)    pravastatin (PRAVACHOL) 20 MG tablet TAKE 1 TABLET BY ORAL ROUTE EVERY DAY (Patient taking differently: Take 20 mg by mouth daily.)    QUEtiapine (SEROQUEL) 25 MG tablet TAKE 1 TABLET (25 MG TOTAL) BY MOUTH AT BEDTIME AS NEEDED. 02/10/2021: The patient's wife said she has been holding this med, as of late, because it seems to make him "agitated- he becomes worse."   rivastigmine (EXELON) 4.6 mg/24hr Place 1 patch (4.6 mg total) onto the  skin daily. (Patient taking differently: Place 4.6 mg onto the skin every evening.)    senna-docusate (SENOKOT-S) 8.6-50 MG tablet Take 1 tablet by mouth every other day.    TRULICITY 1.5 EH/6.3JS SOPN INJECT 1.5 MG INTO THE SKIN ONCE A WEEK. (Patient taking differently: Inject 1.5 mg into the skin every Monday.)    No facility-administered encounter medications on file as of 05/05/2021.    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[place]) 06/08/2020   DNR (do not resuscitate) 06/08/2020   Elevated CK 04/27/2020   Gout    Hyperlipemia    Parkinsonism ([place]) 01/29/2020   Lung granuloma ([place]) 07/24/2019   Dementia with behavioral disturbance ([place]) 05/22/2019   Gait abnormality 05/22/2019   AKI (acute kidney injury) ([place]) 07/14/2018   Nephropathy 07/14/2018   Other disorders of lung 07/14/2018   Atherosclerosis of aorta ([place]) 07/14/2018   Dehydration 07/14/2018   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   CKD (chronic kidney disease), stage III ([place]) 06/13/2018   Syncope 06/13/2018   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[place]) 07/11/2017   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[place]) 06/23/2015   Peripheral arterial disease ([place]) 05/13/2014   Essential hypertension 05/13/2014   Type 2 diabetes mellitus with stage 3a chronic kidney disease, without long-term current use of insulin ([place]) 05/13/2014    Conditions to be addressed/monitored: Type II DM, non-insulin dependent, CKD, stage III, Essential hypertension, Dementia, Parkinsonism, unspecified  Care Plan : Pressue Injury  Updates made by Lynne Logan, RN since 05/05/2021 12:00 AM     Problem: Pressue Injury    Priority: High     Long-Range Goal: Pressure Injury - complications prevented or minimized   Start Date: 05/05/2021  Expected End Date: 10/05/2021  This Visit's Progress: On track  Priority: High  Note:   Current Barriers:  Ineffective Self Health Maintenance in a patient with Type II DM, non-insulin dependent, CKD, stage III, Essential hypertension, Dementia, Parkinsonism, unspecified Currently inpatient at [facility]):  Collaboration with Dylan Chard, MD regarding development and update of comprehensive plan of care as evidenced by provider attestation and co-signature Inter-disciplinary care team collaboration (see longitudinal plan of care) patient will work with care management team to address care coordination and chronic disease management needs related to Disease Management Educational Needs Care Coordination Medication Management and Education Psychosocial Support Dementia and Caregiver Support Level of Care Concerns   Interventions:  05/05/21 completed successful outbound call with spouse Dylan [name] Evaluation of current treatment plan related to Type II DM, non-insulin dependent, CKD, stage III, Essential hypertension, Dementia, Parkinsonism, unspecified, self-management and patient's adherence to plan as established by provider. Collaboration  with Dylan Chard, MD regarding development and update of comprehensive plan of care as evidenced by provider attestation       and co-signature Inter-disciplinary care team collaboration (see longitudinal plan of care) Provided education to spouse about Impaired Skin Integrity Determined spouse is concerned about patient's Impaired skin integrity due to recent development of pressure injuries while inpatient at [facility] Determined spouse is unsure of the wound status or the care being provided for these wounds Reviewed scheduled/upcoming provider appointments including: next follow  up appointment scheduled for 05/12/21 @3 :44 PM for evaluation of wounds by PCP Discussed spouse is very concerned the patient is not receiving the appropriate level of care at current facility and plans to discuss this further with PCP at next visit Collaborated with embedded BSW regarding spouse's concerns and her request for assistance to help relocate patient into an alternate SNF Sent in basket message to [name] PCP with patient status update  Discussed plans with patient for ongoing care management follow up and provided patient with direct contact information for care management team Self Care Activities:  Unable to independently perform any level of Self Care Patient Goals: - Pressure injury - complications prevented or minimized  Follow Up Plan: Telephone follow up appointment with care management team member scheduled for: 05/14/21    Plan:Telephone follow up appointment with care management team member scheduled for:  05/14/21  [name], RN, BSN, CCM Care Management Coordinator [facility]  Direct Phone: [phone]

## 2021-05-12 ENCOUNTER — Encounter: Payer: Self-pay | Admitting: Internal Medicine

## 2021-05-12 ENCOUNTER — Ambulatory Visit (INDEPENDENT_AMBULATORY_CARE_PROVIDER_SITE_OTHER): Payer: Medicare HMO | Admitting: Internal Medicine

## 2021-05-12 ENCOUNTER — Other Ambulatory Visit: Payer: Self-pay

## 2021-05-12 VITALS — BP 124/76 | HR 63 | Temp 97.2°F | Ht 66.0 in

## 2021-05-12 DIAGNOSIS — N1831 Chronic kidney disease, stage 3a: Secondary | ICD-10-CM

## 2021-05-12 DIAGNOSIS — I129 Hypertensive chronic kidney disease with stage 1 through stage 4 chronic kidney disease, or unspecified chronic kidney disease: Secondary | ICD-10-CM

## 2021-05-12 DIAGNOSIS — R634 Abnormal weight loss: Secondary | ICD-10-CM

## 2021-05-12 DIAGNOSIS — F0391 Unspecified dementia with behavioral disturbance: Secondary | ICD-10-CM

## 2021-05-12 DIAGNOSIS — E1122 Type 2 diabetes mellitus with diabetic chronic kidney disease: Secondary | ICD-10-CM | POA: Diagnosis not present

## 2021-05-12 DIAGNOSIS — G20 Parkinson's disease: Secondary | ICD-10-CM

## 2021-05-12 DIAGNOSIS — L89159 Pressure ulcer of sacral region, unspecified stage: Secondary | ICD-10-CM

## 2021-05-12 DIAGNOSIS — L89151 Pressure ulcer of sacral region, stage 1: Secondary | ICD-10-CM | POA: Diagnosis not present

## 2021-05-12 DIAGNOSIS — I10 Essential (primary) hypertension: Secondary | ICD-10-CM

## 2021-05-12 NOTE — Progress Notes (Signed)
I,Katawbba Wiggins,acting as a Neurosurgeon for Gwynneth Aliment, MD.,have documented all relevant documentation on the behalf of Gwynneth Aliment, MD,as directed by  Gwynneth Aliment, MD while in the presence of Gwynneth Aliment, MD.  This visit occurred during the SARS-CoV-2 public health emergency.  Safety protocols were in place, including screening questions prior to the visit, additional usage of staff PPE, and extensive cleaning of exam room while observing appropriate contact time as indicated for disinfecting solutions.  Subjective:     Patient ID: Dylan [name]. , male    DOB: 11-Mar-1941 , 80 y.o.   MRN: [id number]   Chief Complaint  Patient presents with   Diabetes   Hypertension    HPI  Patient is here today for dm and blood pressure f/u. The patient's spouse is unsatisfied with the care the patient is receiving at [facility]. Mrs. Gwinda Passe said that the patient isn't being fed or bathed properly, he also has bed sores.  The spouse has called the DON at the facility several times with no call back.    Diabetes He presents for his follow-up diabetic visit. He has type 2 diabetes mellitus. There are no hypoglycemic associated symptoms. Pertinent negatives for diabetes include no blurred vision and no chest pain. There are no hypoglycemic complications. Diabetic complications include nephropathy. Risk factors for coronary artery disease include diabetes mellitus, dyslipidemia, hypertension, sedentary lifestyle, male sex and obesity. He is compliant with treatment some of the time. He is following a diabetic diet. He participates in exercise intermittently. His breakfast blood glucose is taken between 9-10 am. His breakfast blood glucose range is generally 140-180 mg/dl. An ACE inhibitor/angiotensin II receptor blocker is being taken. He does not see a podiatrist.Eye exam is current.  Hypertension This is a chronic problem. The current episode started more than 1 year ago. The problem has  been gradually improving since onset. The problem is controlled. Pertinent negatives include no blurred vision or chest pain. Risk factors for coronary artery disease include diabetes mellitus, dyslipidemia, male gender, obesity and sedentary lifestyle. There is no history of sleep apnea.    Past Medical History:  Diagnosis Date   Anxiety    Aortic stenosis    mild AS 11/24/16 (peak grad 24, mean grad 11) Dr. 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 Jacinto Halim)   Vitamin B12 deficiency anemia 03/01/2018   Vitamin D deficiency disease      Family History  Problem Relation Age of Onset   Diabetes Mother    Breast cancer Mother    Arthritis Mother    Hypertension Father    Diabetes Sister    Diabetes Brother    Diabetes Maternal Grandmother    Diabetes Brother    Diabetes Brother    Diabetes Brother    Diabetes Sister    Diabetes Sister  Diabetes Sister      Current Outpatient Medications:    apixaban (ELIQUIS) 5 MG TABS tablet, Take 1 tablet (5 mg total) by mouth 2 (two) times daily., Disp: 60 tablet, Rfl: 0   Ascorbic Acid (VITAMIN C) 1000 MG tablet, Take 1,000 mg by mouth every morning. , Disp: , Rfl:    carbidopa-levodopa (SINEMET IR) 25-100 MG tablet, Take 2 tablets by mouth 3 (three) times daily. (Patient taking differently: Take 1 tablet by mouth See admin instructions. Take 1 tablet by mouth at 10 AM  and 7 PM), Disp: 180 tablet, Rfl: 11   carvedilol (COREG) 12.5 MG tablet, TAKE 1 TABLET BY MOUTH 2 TIMES DAILY. (Patient taking differently: Take 12.5 mg by mouth 2 (two) times daily with a meal.), Disp: 60 tablet, Rfl: 11   Cholecalciferol (VITAMIN D3) 5000 units CAPS, Take 5,000 Units by mouth at bedtime., Disp: , Rfl:    desonide (DESOWEN) 0.05 % lotion, Apply topically 2 times per day prn (Patient taking differently: Apply 1 application topically 2 (two) times daily as needed (for irritation- to affected areas of the face).), Disp: 59 mL, Rfl: 1   diclofenac Sodium (VOLTAREN) 1 % GEL, Apply 2 g topically 3 (three) times daily. (Patient taking differently: Apply 2 g topically as needed (to painful sites).), Disp: 150 g, Rfl: 0   glucose blood (ONETOUCH VERIO) test strip, Use as instructed to check blood sugars daily dx: e11.22, Disp: 150 each, Rfl: 3   Glycerin (OASIS TEARS PF OP), Place 1 drop into both eyes in the morning., Disp: , Rfl:    Lancet Devices (ONE TOUCH DELICA LANCING DEV) MISC, Please fill ONE TOUCH DELICA LANCING DEVICE.  Use to test blood sugar twice daily as directed. E11.65 (Patient taking differently: Please fill ONE TOUCH DELICA LANCING DEVICE.  Use to test blood sugar twice daily as directed. E11.65), Disp: 1 each, Rfl: 1   Lancets MISC, Please fill basic/generic push button lancets.  Patient to test blood sugar twice daily. DX: E11.65, Disp: 100 each, Rfl: 4   linaclotide (LINZESS) 72 MCG capsule, Take 1 capsule (72 mcg total) by mouth daily before breakfast. (Patient not taking: No sig reported), Disp: 30 capsule, Rfl: 0   Melatonin 10 MG TABS, Take 10-20 mg by mouth at bedtime as needed (for sleep)., Disp: , Rfl:    memantine (NAMENDA) 10 MG tablet, TAKE 1 TABLET BY MOUTH TWICE A DAY (Patient taking differently: Take 10 mg by mouth 2 (two) times daily.), Disp: 60 tablet, Rfl: 14   metFORMIN (GLUCOPHAGE) 1000 MG tablet, Take 1 tablet by mouth daily (Patient taking differently:  Take 1,000 mg by mouth daily with breakfast.), Disp: 90 tablet, Rfl: 1   pravastatin (PRAVACHOL) 20 MG tablet, TAKE 1 TABLET BY ORAL ROUTE EVERY DAY (Patient taking differently: Take 20 mg by mouth daily.), Disp: 30 tablet, Rfl: 2   QUEtiapine (SEROQUEL) 25 MG tablet, TAKE 1 TABLET (25 MG TOTAL) BY MOUTH AT BEDTIME AS NEEDED., Disp: 90 tablet, Rfl: 1   rivastigmine (EXELON) 4.6 mg/24hr, Place 1 patch (4.6 mg total) onto the skin daily. (Patient taking differently: Place 4.6 mg onto the skin every evening.), Disp: 30 patch, Rfl: 12   senna-docusate (SENOKOT-S) 8.6-50 MG tablet, Take 1 tablet by mouth every other day., Disp: , Rfl:    TRULICITY 1.5 TK/3.5WS SOPN, INJECT 1.5 MG INTO THE SKIN ONCE A WEEK. (Patient taking differently: Inject 1.5 mg into the skin every Monday.), Disp: 12 mL, Rfl: 2  Allergies  Allergen Reactions   Quetiapine Other (See Comments)    Agitation      Review of Systems  Constitutional: Negative.   Eyes:  Negative for blurred vision.  Respiratory: Negative.    Cardiovascular: Negative.  Negative for chest pain.  Gastrointestinal: Negative.   Psychiatric/Behavioral: Negative.    All other systems reviewed and are negative.   Today's Vitals   05/12/21 1515  BP: 124/76  Pulse: 63  Temp: (!) 97.2 F (36.2 C)  TempSrc: Oral  Height: $Remove'5\' 6"'OroYARy$  (1.676 m)  PainSc: 0-No pain   Body mass index is 26.33 kg/m.  Wt Readings from Last 3 Encounters:  02/27/21 163 lb 2.3 oz (74 kg)  02/14/21 163 lb 2.3 oz (74 kg)  02/11/21 163 lb 2.3 oz (74 kg)    BP Readings from Last 3 Encounters:  05/12/21 124/76  02/27/21 (!) 160/80  02/18/21 (!) 181/75    Objective:  Physical Exam Vitals and nursing note reviewed.  Constitutional:      Appearance: Normal appearance.  HENT:     Head:      Comments: Temporal wasting    Nose:     Comments: Masked     Mouth/Throat:     Comments: Masked  Cardiovascular:     Rate and Rhythm: Normal rate and regular rhythm.     Heart  sounds: Normal heart sounds.  Pulmonary:     Effort: Pulmonary effort is normal.     Breath sounds: Normal breath sounds.  Musculoskeletal:     Cervical back: Normal range of motion.  Skin:    General: Skin is warm.     Comments: Stage 1 sacral decub  Neurological:     General: No focal deficit present.     Mental Status: He is alert.  Psychiatric:        Mood and Affect: Mood normal.        Assessment And Plan:     1. Type 2 diabetes mellitus with stage 3a chronic kidney disease, without long-term current use of insulin (HCC) Comments: Chronic, I will check labs as listed below.  Importance of dietary compliance was d/w pt. - CMP14+EGFR - Hemoglobin A1c  2. Parenchymal renal hypertension, stage 1 through stage 4 or unspecified chronic kidney disease Comments: Chronic, well controlled. I will not make any changes today.   3. Pressure injury of sacral region, stage 1 Comments: I will refer him for Wound Care evaluation.  - Ambulatory referral to [facility]  4. Parkinson's disease ([place]) Comments: Chronic, as per Neurology.  Chronic, importance of medication compliance was d/w pt.   5. Dementia with behavioral disturbance, unspecified dementia type ([place]) Comments: Chronic, as per Neurology  6. Unintentional weight loss Comments: I will check prealbumin levels today. I will check thyroid function.  - Prealbumin - TSH - CBC no Diff    Patient was given opportunity to ask questions. Patient verbalized understanding of the plan and was able to repeat key elements of the plan. All questions were answered to their satisfaction.   I, Maximino Greenland, MD, have reviewed all documentation for this visit. The documentation on 05/26/21 for the exam, diagnosis, procedures, and orders are all accurate and complete.   IF YOU HAVE BEEN REFERRED TO A SPECIALIST, IT MAY TAKE 1-2 WEEKS TO SCHEDULE/PROCESS THE REFERRAL. IF YOU HAVE NOT HEARD FROM US/SPECIALIST IN TWO WEEKS, PLEASE GIVE [place] A  CALL AT [phone] X [phone].   THE PATIENT IS ENCOURAGED TO PRACTICE SOCIAL  DISTANCING DUE TO THE COVID-19 PANDEMIC.

## 2021-05-13 LAB — CMP14+EGFR
ALT: 6 IU/L (ref 0–44)
AST: 11 IU/L (ref 0–40)
Albumin/Globulin Ratio: 1.6 (ref 1.2–2.2)
Albumin: 4.1 g/dL (ref 3.7–4.7)
Alkaline Phosphatase: 98 IU/L (ref 44–121)
BUN/Creatinine Ratio: 19 (ref 10–24)
BUN: 21 mg/dL (ref 8–27)
Bilirubin Total: 0.2 mg/dL (ref 0.0–1.2)
CO2: 21 mmol/L (ref 20–29)
Calcium: 9.6 mg/dL (ref 8.6–10.2)
Chloride: 102 mmol/L (ref 96–106)
Creatinine, Ser: 1.11 mg/dL (ref 0.76–1.27)
Globulin, Total: 2.5 g/dL (ref 1.5–4.5)
Glucose: 116 mg/dL — ABNORMAL HIGH (ref 65–99)
Potassium: 4.6 mmol/L (ref 3.5–5.2)
Sodium: 141 mmol/L (ref 134–144)
Total Protein: 6.6 g/dL (ref 6.0–8.5)
eGFR: 68 mL/min/{1.73_m2} (ref >59)

## 2021-05-13 LAB — CBC
Hematocrit: 37 % — ABNORMAL LOW (ref 37.5–51.0)
Hemoglobin: 12 g/dL — ABNORMAL LOW (ref 13.0–17.7)
MCH: 28.1 pg (ref 26.6–33.0)
MCHC: 32.4 g/dL (ref 31.5–35.7)
MCV: 87 fL (ref 79–97)
Platelets: 187 10*3/uL (ref 150–450)
RBC: 4.27 x10E6/uL (ref 4.14–5.80)
RDW: 13.5 % (ref 11.6–15.4)
WBC: 4.4 10*3/uL (ref 3.4–10.8)

## 2021-05-13 LAB — TSH: TSH: 0.747 u[IU]/mL (ref 0.450–4.500)

## 2021-05-13 LAB — PREALBUMIN: PREALBUMIN: 20 mg/dL (ref 9–32)

## 2021-05-13 LAB — HEMOGLOBIN A1C
Est. average glucose Bld gHb Est-mCnc: 157 mg/dL
Hgb A1c MFr Bld: 7.1 % — ABNORMAL HIGH (ref 4.8–5.6)

## 2021-05-14 ENCOUNTER — Ambulatory Visit: Payer: Self-pay

## 2021-05-14 ENCOUNTER — Telehealth: Payer: Medicare HMO

## 2021-05-14 DIAGNOSIS — F0391 Unspecified dementia with behavioral disturbance: Secondary | ICD-10-CM

## 2021-05-14 DIAGNOSIS — I10 Essential (primary) hypertension: Secondary | ICD-10-CM

## 2021-05-14 DIAGNOSIS — E1122 Type 2 diabetes mellitus with diabetic chronic kidney disease: Secondary | ICD-10-CM | POA: Diagnosis not present

## 2021-05-14 DIAGNOSIS — N1831 Chronic kidney disease, stage 3a: Secondary | ICD-10-CM

## 2021-05-14 DIAGNOSIS — L89159 Pressure ulcer of sacral region, unspecified stage: Secondary | ICD-10-CM

## 2021-05-14 DIAGNOSIS — N183 Chronic kidney disease, stage 3 unspecified: Secondary | ICD-10-CM

## 2021-05-14 DIAGNOSIS — G20 Parkinson's disease: Secondary | ICD-10-CM

## 2021-05-14 DIAGNOSIS — G20A1 Parkinson's disease without dyskinesia, without mention of fluctuations: Secondary | ICD-10-CM

## 2021-05-19 NOTE — Patient Instructions (Signed)
Goals Addressed      Pressure Injury - complications prevented or minimized   On track    Timeframe:  Long-Range Goal Priority:  High Start Date: 05/05/21                            Expected End Date: 11/05/21  Next Scheduled Follow up date: 06/25/21          Self Care Activities:  Unable to independently perform any level of Self Care Patient Goals: - Pressure injury - complications prevented or minimized

## 2021-05-19 NOTE — Chronic Care Management (AMB) (Signed)
Chronic Care Management   CCM RN Visit Note  05/14/2021 Name: Dylan [name]. MRN: [id number] DOB: 1940-11-13  Subjective: Dylan [name]. is a 80 y.o. year old male who is a primary care patient of [name], MD. The care management team was consulted for assistance with disease management and care coordination needs.    Engaged with patient by telephone for follow up visit in response to provider referral for case management and/or care coordination services.   Consent to Services:  The patient was given information about Chronic Care Management services, agreed to services, and gave verbal consent prior to initiation of services.  Please see initial visit note for detailed documentation.   Patient agreed to services and verbal consent obtained.   Assessment: Review of patient past medical history, allergies, medications, health status, including review of consultants reports, laboratory and other test data, was performed as part of comprehensive evaluation and provision of chronic care management services.   SDOH (Social Determinants of Health) assessments and interventions performed:    CCM Care Plan  Allergies  Allergen Reactions   Quetiapine Other (See Comments)    Agitation     Outpatient Encounter Medications as of 05/14/2021  Medication Sig Note   apixaban (ELIQUIS) 5 MG TABS tablet Take 1 tablet (5 mg total) by mouth 2 (two) times daily.    Ascorbic Acid (VITAMIN C) 1000 MG tablet Take 1,000 mg by mouth every morning.     carbidopa-levodopa (SINEMET IR) 25-100 MG tablet Take 2 tablets by mouth 3 (three) times daily. (Patient taking differently: Take 1 tablet by mouth See admin instructions. Take 1 tablet by mouth at 10 AM and 7 PM)    carvedilol (COREG) 12.5 MG tablet TAKE 1 TABLET BY MOUTH 2 TIMES DAILY. (Patient taking differently: Take 12.5 mg by mouth 2 (two) times daily with a meal.)    Cholecalciferol (VITAMIN D3) 5000 units CAPS Take 5,000 Units by mouth  at bedtime.    desonide (DESOWEN) 0.05 % lotion Apply topically 2 times per day prn (Patient taking differently: Apply 1 application topically 2 (two) times daily as needed (for irritation- to affected areas of the face).)    diclofenac Sodium (VOLTAREN) 1 % GEL Apply 2 g topically 3 (three) times daily. (Patient taking differently: Apply 2 g topically as needed (to painful sites).)    glucose blood (ONETOUCH VERIO) test strip Use as instructed to check blood sugars daily dx: e11.22    Glycerin (OASIS TEARS PF OP) Place 1 drop into both eyes in the morning.    Lancet Devices (ONE TOUCH DELICA LANCING DEV) MISC Please fill ONE TOUCH DELICA LANCING DEVICE.  Use to test blood sugar twice daily as directed. E11.65 (Patient taking differently: Please fill ONE TOUCH DELICA LANCING DEVICE.  Use to test blood sugar twice daily as directed. E11.65)    Lancets MISC Please fill basic/generic push button lancets.  Patient to test blood sugar twice daily. DX: E11.65    linaclotide (LINZESS) 72 MCG capsule Take 1 capsule (72 mcg total) by mouth daily before breakfast. (Patient not taking: No sig reported)    Melatonin 10 MG TABS Take 10-20 mg by mouth at bedtime as needed (for sleep).    memantine (NAMENDA) 10 MG tablet TAKE 1 TABLET BY MOUTH TWICE A DAY (Patient taking differently: Take 10 mg by mouth 2 (two) times daily.)    metFORMIN (GLUCOPHAGE) 1000 MG tablet Take 1 tablet by mouth daily (Patient taking differently: Take 1,000 mg by  mouth daily with breakfast.)    pravastatin (PRAVACHOL) 20 MG tablet TAKE 1 TABLET BY ORAL ROUTE EVERY DAY (Patient taking differently: Take 20 mg by mouth daily.)    QUEtiapine (SEROQUEL) 25 MG tablet TAKE 1 TABLET (25 MG TOTAL) BY MOUTH AT BEDTIME AS NEEDED. 02/10/2021: The patient's wife said she has been holding this med, as of late, because it seems to make him "agitated- he becomes worse."   rivastigmine (EXELON) 4.6 mg/24hr Place 1 patch (4.6 mg total) onto the skin daily.  (Patient taking differently: Place 4.6 mg onto the skin every evening.)    senna-docusate (SENOKOT-S) 8.6-50 MG tablet Take 1 tablet by mouth every other day.    TRULICITY 1.5 ST/4.1DQ SOPN INJECT 1.5 MG INTO THE SKIN ONCE A WEEK. (Patient taking differently: Inject 1.5 mg into the skin every Monday.)    No facility-administered encounter medications on file as of 05/14/2021.    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[place]) 06/08/2020   DNR (do not resuscitate) 06/08/2020   Elevated CK 04/27/2020   Gout    Hyperlipemia    Parkinsonism ([place]) 01/29/2020   Lung granuloma ([place]) 07/24/2019   Dementia with behavioral disturbance ([place]) 05/22/2019   Gait abnormality 05/22/2019   AKI (acute kidney injury) ([place]) 07/14/2018   Nephropathy 07/14/2018   Other disorders of lung 07/14/2018   Atherosclerosis of aorta ([place]) 07/14/2018   Dehydration 07/14/2018   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) 06/23/2015   Peripheral arterial disease ([place]) 05/13/2014   Essential hypertension 05/13/2014   Type 2 diabetes mellitus with stage 3a chronic kidney disease, without long-term current use of insulin ([place]) 05/13/2014    Conditions to be addressed/monitored: Type II DM, non-insulin dependent, CKD, stage III, Essential hypertension, Dementia, Parkinsonism, unspecified  Care Plan : Pressue Injury  Updates made by Lynne Logan, RN since 05/14/2021 12:00 AM     Problem: Pressue Injury   Priority: High      Long-Range Goal: Pressure Injury - complications prevented or minimized   Start Date: 05/05/2021  Expected End Date: 10/05/2021  Recent Progress: On track  Priority: High  Note:   Current Barriers:  Ineffective Self Health Maintenance in a patient with Type II DM, non-insulin dependent, CKD, stage III, Essential hypertension, Dementia, Parkinsonism, unspecified Currently inpatient at [facility]):  Collaboration with [name], MD regarding development and update of comprehensive plan of care as evidenced by provider attestation and co-signature Inter-disciplinary care team collaboration (see longitudinal plan of care) patient will work with care management team to address care coordination and chronic disease management needs related to Disease Management Educational Needs Care Coordination Medication Management and Education Psychosocial Support Dementia and Caregiver Support Level of Care Concerns   Interventions:  05/14/21 completed successful outbound call with spouse Dylan [name] Evaluation of current treatment plan related to Type II DM, non-insulin dependent, CKD, stage III, Essential hypertension, Dementia, Parkinsonism, unspecified, self-management and patient's adherence to plan as established by provider. Collaboration with [name], MD  regarding development and update of comprehensive plan of care as evidenced by provider attestation       and co-signature Inter-disciplinary care team collaboration (see longitudinal plan of care) Determined patient completed his PCP follow up with [name] as scheduled on 05/12/21 Discussed with spouse Dylan [name], [name] assessed patient's pressure wound and faxed recommendations for wound care to his current skilled nursing facility Discussed [name] advised Dylan [name] patient's skin integrity "looks good" Discussed Dylan [name] is still considering having Dylan [name] transferred to a SNF  near her home in order for him to complete his rehabilitation Advised and instructed spouse to ask to speak with the facility CM or SW in order to help facilitate this transfer if needed Instructed on how to coordinate a care plan meeting with his current health care team within the facility to discuss his plan of care including her concerns regarding his progress and the care he is receiving   Discussed plans with patient for ongoing care management follow up and provided patient with direct contact information for care management team Self Care Activities:  Unable to independently perform any level of Self Care Patient Goals: - Pressure injury - complications prevented or minimized  Follow Up Plan: Telephone follow up appointment with care management team member scheduled for: 06/25/21    Plan:Telephone follow up appointment with care management team member scheduled for:  06/25/21  [name], RN, BSN, CCM Care Management Coordinator [place] [facility]  Direct Phone: [phone]

## 2021-05-24 ENCOUNTER — Ambulatory Visit: Payer: Medicare HMO

## 2021-05-24 DIAGNOSIS — G20A1 Parkinson's disease without dyskinesia, without mention of fluctuations: Secondary | ICD-10-CM

## 2021-05-24 DIAGNOSIS — N1831 Chronic kidney disease, stage 3a: Secondary | ICD-10-CM

## 2021-05-24 DIAGNOSIS — F0391 Unspecified dementia with behavioral disturbance: Secondary | ICD-10-CM

## 2021-05-24 DIAGNOSIS — E1122 Type 2 diabetes mellitus with diabetic chronic kidney disease: Secondary | ICD-10-CM

## 2021-05-24 DIAGNOSIS — G20 Parkinson's disease: Secondary | ICD-10-CM

## 2021-05-24 DIAGNOSIS — I10 Essential (primary) hypertension: Secondary | ICD-10-CM

## 2021-05-24 NOTE — Patient Instructions (Signed)
Social Worker Visit Information  Goals we discussed today:   Goals Addressed             This Visit's Progress    Disease Progression Managed with Care Coordination       Timeframe:  Long-Range Goal Priority:  Low Start Date:  2.21.22                           Expected End Date: 9.16.22                       Next planned outreach: 8.8.22  Patient Goals/Self-Care Activities patient will: With the help of his caregiver [name]  - Review resource information on [facility] [place] -Contact the [employer] to discuss options for asset protection -Engage with therapy team to improve strength and mobility         Materials Provided: Yes: provided verbal and written resource information  Follow Up Plan: SW will follow up with patient by phone over the next 21 days  Daneen Schick, BSW, CDP Social Worker, Certified Dementia Practitioner [place] [facility] [place] [facility] [phone]

## 2021-05-24 NOTE — Chronic Care Management (AMB) (Signed)
Chronic Care Management    Social Work Note  05/24/2021 Name: Dylan [name]. MRN: [id number] DOB: Apr 24, 1941  Dylan [name]. is a 80 y.o. year old male who is a primary care patient of [name], MD. The CCM team was consulted to assist the patient with chronic disease management and/or care coordination needs related to: Level of Care Concerns.   Engaged with [name] by phone  for follow up visit in response to provider referral for social work chronic care management and care coordination services.   Consent to Services:  The patient was given information about Chronic Care Management services, agreed to services, and gave verbal consent prior to initiation of services.  Please see initial visit note for detailed documentation.   Patient agreed to services and consent obtained.   Assessment: Review of patient past medical history, allergies, medications, and health status, including review of relevant consultants reports was performed today as part of a comprehensive evaluation and provision of chronic care management and care coordination services.     SDOH (Social Determinants of Health) assessments and interventions performed:    Advanced Directives Status: Not addressed in this encounter.  CCM Care Plan  Allergies  Allergen Reactions   Quetiapine Other (See Comments)    Agitation     Outpatient Encounter Medications as of 05/24/2021  Medication Sig Note   apixaban (ELIQUIS) 5 MG TABS tablet Take 1 tablet (5 mg total) by mouth 2 (two) times daily.    Ascorbic Acid (VITAMIN C) 1000 MG tablet Take 1,000 mg by mouth every morning.     carbidopa-levodopa (SINEMET IR) 25-100 MG tablet Take 2 tablets by mouth 3 (three) times daily. (Patient taking differently: Take 1 tablet by mouth See admin instructions. Take 1 tablet by mouth at 10 AM and 7 PM)    carvedilol (COREG) 12.5 MG tablet TAKE 1 TABLET BY MOUTH 2 TIMES DAILY. (Patient taking differently: Take 12.5 mg  by mouth 2 (two) times daily with a meal.)    Cholecalciferol (VITAMIN D3) 5000 units CAPS Take 5,000 Units by mouth at bedtime.    desonide (DESOWEN) 0.05 % lotion Apply topically 2 times per day prn (Patient taking differently: Apply 1 application topically 2 (two) times daily as needed (for irritation- to affected areas of the face).)    diclofenac Sodium (VOLTAREN) 1 % GEL Apply 2 g topically 3 (three) times daily. (Patient taking differently: Apply 2 g topically as needed (to painful sites).)    glucose blood (ONETOUCH VERIO) test strip Use as instructed to check blood sugars daily dx: e11.22    Glycerin (OASIS TEARS PF OP) Place 1 drop into both eyes in the morning.    Lancet Devices (ONE TOUCH DELICA LANCING DEV) MISC Please fill ONE TOUCH DELICA LANCING DEVICE.  Use to test blood sugar twice daily as directed. E11.65 (Patient taking differently: Please fill ONE TOUCH DELICA LANCING DEVICE.  Use to test blood sugar twice daily as directed. E11.65)    Lancets MISC Please fill basic/generic push button lancets.  Patient to test blood sugar twice daily. DX: E11.65    linaclotide (LINZESS) 72 MCG capsule Take 1 capsule (72 mcg total) by mouth daily before breakfast. (Patient not taking: No sig reported)    Melatonin 10 MG TABS Take 10-20 mg by mouth at bedtime as needed (for sleep).    memantine (NAMENDA) 10 MG tablet TAKE 1 TABLET BY MOUTH TWICE A DAY (Patient taking differently: Take 10 mg by mouth 2 (two)  times daily.)    metFORMIN (GLUCOPHAGE) 1000 MG tablet Take 1 tablet by mouth daily (Patient taking differently: Take 1,000 mg by mouth daily with breakfast.)    pravastatin (PRAVACHOL) 20 MG tablet TAKE 1 TABLET BY ORAL ROUTE EVERY DAY (Patient taking differently: Take 20 mg by mouth daily.)    QUEtiapine (SEROQUEL) 25 MG tablet TAKE 1 TABLET (25 MG TOTAL) BY MOUTH AT BEDTIME AS NEEDED. 02/10/2021: The patient's wife said she has been holding this med, as of late, because it seems to make him  "agitated- he becomes worse."   rivastigmine (EXELON) 4.6 mg/24hr Place 1 patch (4.6 mg total) onto the skin daily. (Patient taking differently: Place 4.6 mg onto the skin every evening.)    senna-docusate (SENOKOT-S) 8.6-50 MG tablet Take 1 tablet by mouth every other day.    TRULICITY 1.5 ZJ/6.7HA SOPN INJECT 1.5 MG INTO THE SKIN ONCE A WEEK. (Patient taking differently: Inject 1.5 mg into the skin every Monday.)    No facility-administered encounter medications on file as of 05/24/2021.    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[place]) 06/08/2020   DNR (do not resuscitate) 06/08/2020   Elevated CK 04/27/2020   Gout    Hyperlipemia    Parkinsonism ([place]) 01/29/2020   Lung granuloma ([place]) 07/24/2019   Dementia with behavioral disturbance ([place]) 05/22/2019   Gait abnormality 05/22/2019   AKI (acute kidney injury) ([place]) 07/14/2018   Nephropathy 07/14/2018   Other disorders of lung 07/14/2018   Atherosclerosis of aorta ([place]) 07/14/2018   Dehydration 07/14/2018   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[place]) 06/23/2015   Peripheral arterial disease ([place]) 05/13/2014   Essential hypertension 05/13/2014   Type 2 diabetes mellitus with stage 3a chronic kidney disease, without long-term current use of insulin ([place]) 05/13/2014    Conditions to be addressed/monitored: DMII, CKD Stage III, Dementia, and Parkinsonism ; Level of care concerns  Care Plan : Social Work Care Plan  Updates made by Daneen Schick since 05/24/2021 12:00 AM     Problem: Disease Progression      Long-Range Goal: Disease Progression Managed with Care Coordination   Start Date: 12/21/2020  Expected End Date: 07/06/2021  Recent Progress: On track  Priority: Low  Note:   Current Barriers:  Level of care concerns Recent Diagnosis of Prostate Cancer  Chronic conditions including DM II, CKD III, Dementia, and Parkinson's disease which put patient at increased risk of hospitaliation Unable to perform ADLs independently Unable to perform IADLs independently  Social Work Clinical Goal(s):   the patient and his spouse, [name], will work with SW to address care coordination needs related to disease progression  Interventions: 1:1 collaboration with [name], MD regarding development and update of comprehensive plan of care as evidenced by provider attestation and co-signature Inter-disciplinary care team collaboration (see longitudinal plan of care) Successful outbound call placed to [name] to follow up on patients care coordination needs Discussed the patient continues to remain in rehab at [facility] chart to note patient  seen by provider on 7.13 to assess wound care needs [name] indicates she thought [name] was sending wound care orders to [facility] but the staff stated they have not received any new orders Advised [name] SW would collaborate with the patients provider office to follow up on wound care orders Collaboration with [name] [name] to advise SNF has not received wound care orders Discussed the patient is participating in rehab but he is not getting out of bed- most of his therapy is being done while patient is still laying down Reviewed difficulty with therapy due to patients high fall risk along with concern with deconditioning Determined the patient has not yet been given a discharge date - [name] is concerned with ability to care for the  patient in her home and limited ability to hire a private caregiver Educated [name] on opportunity to apply for long term care Medicaid - discussed the patients income would go to the state and his assets may need to be spent prior to [facility] covering long term care Discussed concern for [name] to continue to cover costs of living without patients income Provided [name] with contact information to an Elder Training and development officer to discuss options for Medicaid while also protecting assets Educated [name] on the PACE program as an option if the patient returns home Provided written materials for review  Patient Goals/Self-Care Activities patient will: With the help of his caregiver [name]  -  Review resource information on [facility] [place] -Contact the [employer] to discuss options for asset protection -Engage with therapy team to improve strength and mobility  Follow up Plan: SW will follow up with patient by phone over the next 21 days       Follow Up Plan: SW will follow up with patient by phone over the next 21 days      Daneen Schick, BSW, CDP Social Worker, Certified Dementia Practitioner [place] [facility] [place] [facility] [phone]

## 2021-05-28 ENCOUNTER — Telehealth: Payer: Medicare HMO

## 2021-05-28 ENCOUNTER — Telehealth: Payer: Self-pay

## 2021-05-28 NOTE — Telephone Encounter (Cosign Needed)
    Care Management   Follow Up Note   05/28/2021 Name: Dylan [name]. MRN: [id number] DOB: 1941/05/30   Referred by: [name], MD Reason for referral : Chronic Care Management (Care Coordination )   Received voice message from spouse [name] stating the nursing home did not receive orders from [name] regarding patients wound care for several pressure injuries. She advised she is concerned due to patient has developed a new area of concern on his right heel. Collaborated with [name] regarding spouse's concerns. [name] advised she is communicating with the facility. The patient was referred to the case management team for assistance with care management and care coordination.   Follow Up Plan: Telephone follow up appointment with care management team member scheduled for: 06/07/21  [name], RN, BSN, CCM Care Management Coordinator [place] [facility]  Direct Phone: [phone]

## 2021-06-07 ENCOUNTER — Ambulatory Visit (INDEPENDENT_AMBULATORY_CARE_PROVIDER_SITE_OTHER): Payer: Medicare HMO

## 2021-06-07 DIAGNOSIS — F0391 Unspecified dementia with behavioral disturbance: Secondary | ICD-10-CM

## 2021-06-07 DIAGNOSIS — I10 Essential (primary) hypertension: Secondary | ICD-10-CM

## 2021-06-07 DIAGNOSIS — N1831 Chronic kidney disease, stage 3a: Secondary | ICD-10-CM

## 2021-06-07 DIAGNOSIS — G20 Parkinson's disease: Secondary | ICD-10-CM

## 2021-06-07 DIAGNOSIS — E1122 Type 2 diabetes mellitus with diabetic chronic kidney disease: Secondary | ICD-10-CM

## 2021-06-07 NOTE — Patient Instructions (Signed)
Social Worker Visit Information  Goals we discussed today:   Goals Addressed             This Visit's Progress    COMPLETED: Disease Progression Managed with Care Coordination       Timeframe:  Long-Range Goal Priority:  Low Start Date:  2.21.22                           Expected End Date: 9.16.22                       Patient Goals/Self-Care Activities patient will: With the help of his caregiver [place] with SNF SW to complete long term care Medicaid application         Follow Up Plan:  No follow up planned at this time. Please contact me as needed.   Daneen Schick, BSW, CDP Social Worker, Certified Dementia Practitioner [place] / [place] [facility] [phone]

## 2021-06-07 NOTE — Chronic Care Management (AMB) (Signed)
Chronic Care Management    Social Work Note  06/07/2021 Name: Dylan [name]. MRN: [id number] DOB: [birth year]  Dylan Bays. is a 80 y.o. year old male who is a primary care patient of [name], MD. The CCM team was consulted to assist the patient with chronic disease management and/or care coordination needs related to: Level of Care Concerns.   Engaged with Dylan [name] by phone  for follow up visit in response to provider referral for social work chronic care management and care coordination services.   Consent to Services:  The patient was given information about Chronic Care Management services, agreed to services, and gave verbal consent prior to initiation of services.  Please see initial visit note for detailed documentation.   Patient agreed to services and consent obtained.   Assessment: Review of patient past medical history, allergies, medications, and health status, including review of relevant consultants reports was performed today as part of a comprehensive evaluation and provision of chronic care management and care coordination services.     SDOH (Social Determinants of Health) assessments and interventions performed:    Advanced Directives Status: Not addressed in this encounter.  CCM Care Plan  Allergies  Allergen Reactions   Quetiapine Other (See Comments)    Agitation     Outpatient Encounter Medications as of 06/07/2021  Medication Sig Note   apixaban (ELIQUIS) 5 MG TABS tablet Take 1 tablet (5 mg total) by mouth 2 (two) times daily.    Ascorbic Acid (VITAMIN C) 1000 MG tablet Take 1,000 mg by mouth every morning.     carbidopa-levodopa (SINEMET IR) 25-100 MG tablet Take 2 tablets by mouth 3 (three) times daily. (Patient taking differently: Take 1 tablet by mouth See admin instructions. Take 1 tablet by mouth at 10 AM and 7 PM)    carvedilol (COREG) 12.5 MG tablet TAKE 1 TABLET BY MOUTH 2 TIMES DAILY. (Patient taking differently: Take 12.5 mg by  mouth 2 (two) times daily with a meal.)    Cholecalciferol (VITAMIN D3) 5000 units CAPS Take 5,000 Units by mouth at bedtime.    desonide (DESOWEN) 0.05 % lotion Apply topically 2 times per day prn (Patient taking differently: Apply 1 application topically 2 (two) times daily as needed (for irritation- to affected areas of the face).)    diclofenac Sodium (VOLTAREN) 1 % GEL Apply 2 g topically 3 (three) times daily. (Patient taking differently: Apply 2 g topically as needed (to painful sites).)    glucose blood (ONETOUCH VERIO) test strip Use as instructed to check blood sugars daily dx: e11.22    Glycerin (OASIS TEARS PF OP) Place 1 drop into both eyes in the morning.    Lancet Devices (ONE TOUCH DELICA LANCING DEV) MISC Please fill ONE TOUCH DELICA LANCING DEVICE.  Use to test blood sugar twice daily as directed. E11.65 (Patient taking differently: Please fill ONE TOUCH DELICA LANCING DEVICE.  Use to test blood sugar twice daily as directed. E11.65)    Lancets MISC Please fill basic/generic push button lancets.  Patient to test blood sugar twice daily. DX: E11.65    linaclotide (LINZESS) 72 MCG capsule Take 1 capsule (72 mcg total) by mouth daily before breakfast. (Patient not taking: No sig reported)    Melatonin 10 MG TABS Take 10-20 mg by mouth at bedtime as needed (for sleep).    memantine (NAMENDA) 10 MG tablet TAKE 1 TABLET BY MOUTH TWICE A DAY (Patient taking differently: Take 10 mg by mouth 2 (two)  times daily.)    metFORMIN (GLUCOPHAGE) 1000 MG tablet Take 1 tablet by mouth daily (Patient taking differently: Take 1,000 mg by mouth daily with breakfast.)    pravastatin (PRAVACHOL) 20 MG tablet TAKE 1 TABLET BY ORAL ROUTE EVERY DAY (Patient taking differently: Take 20 mg by mouth daily.)    QUEtiapine (SEROQUEL) 25 MG tablet TAKE 1 TABLET (25 MG TOTAL) BY MOUTH AT BEDTIME AS NEEDED. 02/10/2021: The patient's wife said she has been holding this med, as of late, because it seems to make him  "agitated- he becomes worse."   rivastigmine (EXELON) 4.6 mg/24hr Place 1 patch (4.6 mg total) onto the skin daily. (Patient taking differently: Place 4.6 mg onto the skin every evening.)    senna-docusate (SENOKOT-S) 8.6-50 MG tablet Take 1 tablet by mouth every other day.    TRULICITY 1.5 TK/1.6WF SOPN INJECT 1.5 MG INTO THE SKIN ONCE A WEEK. (Patient taking differently: Inject 1.5 mg into the skin every Monday.)    No facility-administered encounter medications on file as of 06/07/2021.    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[place]) 06/08/2020   DNR (do not resuscitate) 06/08/2020   Elevated CK 04/27/2020   Gout    Hyperlipemia    Parkinsonism ([place]) 01/29/2020   Lung granuloma ([place]) 07/24/2019   Dementia with behavioral disturbance ([place]) 05/22/2019   Gait abnormality 05/22/2019   AKI (acute kidney injury) ([place]) 07/14/2018   Nephropathy 07/14/2018   Other disorders of lung 07/14/2018   Atherosclerosis of aorta ([place]) 07/14/2018   Dehydration 07/14/2018   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   CKD (chronic kidney disease), stage III ([place]) 06/13/2018   Syncope 06/13/2018   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[place]) 06/23/2015   Peripheral arterial disease ([place]) 05/13/2014   Essential hypertension 05/13/2014   Type 2 diabetes mellitus with stage 3a chronic kidney disease, without long-term current use of insulin ([place]) 05/13/2014    Conditions to be addressed/monitored: DMII, CKD Stage III, Dementia, and Parkinsonism ; Level of care concerns  Care Plan : Social Work Care Plan  Updates made by Daneen Schick since 06/07/2021 12:00 AM  Completed 06/07/2021   Problem: Disease Progression Resolved 06/07/2021     Long-Range Goal: Disease Progression Managed with Care Coordination Completed 06/07/2021  Start Date: 12/21/2020  Expected End Date: 07/06/2021  Recent Progress: On track  Priority: Low  Note:   Current Barriers:  Level of care concerns Recent Diagnosis of Prostate Cancer  Chronic conditions including DM II, CKD III, Dementia, and Parkinson's disease which put patient at increased risk of hospitaliation Unable to perform ADLs independently Unable to perform IADLs independently  Social Work Clinical Goal(s):   the patient and his spouse, Dylan [name], will work with SW to address care coordination needs related to disease progression  Interventions: 1:1 collaboration with [name], MD regarding development and update of comprehensive plan of care as evidenced by provider attestation and co-signature Inter-disciplinary care team collaboration (see longitudinal plan of care) Successful outbound call placed to [name] to follow up on patients care coordination needs Determined the patients health plan has stopped paying for rehab Discussed the patient  remains in SNF with the social worker assisting with getting the patient approved for long term care Medicaid [name] reports she can not care for the patient in her home as he is non-weightbearing  Advised Mrs. Colbert Ewing would plan to close current goal as patient is working with a Education officer, museum in the SNF for [employer] approval Discussed plans for [place] to follow up with [name] to address care management needs Encouraged [name] to contact SW as needed  Patient Goals/Self-Care Activities patient will: With the help of his caregiver [place] with SNF SW to complete long term care Medicaid application         Follow Up Plan:  No SW follow up planned at this time. The patient will  remain engaged with RN Care Manager.      Daneen Schick, BSW, CDP Social Worker, Certified Dementia Practitioner [facility] [phone]

## 2021-06-11 ENCOUNTER — Telehealth: Payer: Self-pay

## 2021-06-12 ENCOUNTER — Emergency Department (HOSPITAL_COMMUNITY): Payer: Medicare HMO

## 2021-06-12 ENCOUNTER — Inpatient Hospital Stay (HOSPITAL_COMMUNITY)
Admission: EM | Admit: 2021-06-12 | Discharge: 2021-07-01 | DRG: 177 | Disposition: E | Payer: Medicare HMO | Source: Skilled Nursing Facility | Attending: Internal Medicine | Admitting: Internal Medicine

## 2021-06-12 ENCOUNTER — Observation Stay (HOSPITAL_COMMUNITY): Payer: Medicare HMO

## 2021-06-12 DIAGNOSIS — E1165 Type 2 diabetes mellitus with hyperglycemia: Secondary | ICD-10-CM

## 2021-06-12 DIAGNOSIS — I350 Nonrheumatic aortic (valve) stenosis: Secondary | ICD-10-CM | POA: Diagnosis present

## 2021-06-12 DIAGNOSIS — I739 Peripheral vascular disease, unspecified: Secondary | ICD-10-CM | POA: Diagnosis present

## 2021-06-12 DIAGNOSIS — I442 Atrioventricular block, complete: Secondary | ICD-10-CM | POA: Diagnosis present

## 2021-06-12 DIAGNOSIS — E1100 Type 2 diabetes mellitus with hyperosmolarity without nonketotic hyperglycemic-hyperosmolar coma (NKHHC): Secondary | ICD-10-CM | POA: Diagnosis not present

## 2021-06-12 DIAGNOSIS — Z803 Family history of malignant neoplasm of breast: Secondary | ICD-10-CM

## 2021-06-12 DIAGNOSIS — N1831 Chronic kidney disease, stage 3a: Secondary | ICD-10-CM | POA: Diagnosis present

## 2021-06-12 DIAGNOSIS — E1111 Type 2 diabetes mellitus with ketoacidosis with coma: Secondary | ICD-10-CM | POA: Diagnosis present

## 2021-06-12 DIAGNOSIS — R404 Transient alteration of awareness: Secondary | ICD-10-CM | POA: Diagnosis present

## 2021-06-12 DIAGNOSIS — N1832 Chronic kidney disease, stage 3b: Secondary | ICD-10-CM | POA: Diagnosis present

## 2021-06-12 DIAGNOSIS — I480 Paroxysmal atrial fibrillation: Secondary | ICD-10-CM | POA: Diagnosis present

## 2021-06-12 DIAGNOSIS — Z950 Presence of cardiac pacemaker: Secondary | ICD-10-CM | POA: Diagnosis not present

## 2021-06-12 DIAGNOSIS — Z888 Allergy status to other drugs, medicaments and biological substances status: Secondary | ICD-10-CM

## 2021-06-12 DIAGNOSIS — L89610 Pressure ulcer of right heel, unstageable: Secondary | ICD-10-CM | POA: Diagnosis present

## 2021-06-12 DIAGNOSIS — H04123 Dry eye syndrome of bilateral lacrimal glands: Secondary | ICD-10-CM | POA: Diagnosis present

## 2021-06-12 DIAGNOSIS — U071 COVID-19: Principal | ICD-10-CM

## 2021-06-12 DIAGNOSIS — Z6822 Body mass index (BMI) 22.0-22.9, adult: Secondary | ICD-10-CM

## 2021-06-12 DIAGNOSIS — Z79899 Other long term (current) drug therapy: Secondary | ICD-10-CM

## 2021-06-12 DIAGNOSIS — G9340 Encephalopathy, unspecified: Secondary | ICD-10-CM | POA: Diagnosis not present

## 2021-06-12 DIAGNOSIS — Z8261 Family history of arthritis: Secondary | ICD-10-CM

## 2021-06-12 DIAGNOSIS — N179 Acute kidney failure, unspecified: Secondary | ICD-10-CM

## 2021-06-12 DIAGNOSIS — E860 Dehydration: Secondary | ICD-10-CM | POA: Diagnosis present

## 2021-06-12 DIAGNOSIS — R791 Abnormal coagulation profile: Secondary | ICD-10-CM | POA: Diagnosis present

## 2021-06-12 DIAGNOSIS — M109 Gout, unspecified: Secondary | ICD-10-CM | POA: Diagnosis present

## 2021-06-12 DIAGNOSIS — E785 Hyperlipidemia, unspecified: Secondary | ICD-10-CM | POA: Diagnosis present

## 2021-06-12 DIAGNOSIS — G20 Parkinson's disease: Secondary | ICD-10-CM | POA: Diagnosis present

## 2021-06-12 DIAGNOSIS — F0280 Dementia in other diseases classified elsewhere without behavioral disturbance: Secondary | ICD-10-CM | POA: Diagnosis present

## 2021-06-12 DIAGNOSIS — R64 Cachexia: Secondary | ICD-10-CM | POA: Diagnosis present

## 2021-06-12 DIAGNOSIS — Z7984 Long term (current) use of oral hypoglycemic drugs: Secondary | ICD-10-CM

## 2021-06-12 DIAGNOSIS — I1310 Hypertensive heart and chronic kidney disease without heart failure, with stage 1 through stage 4 chronic kidney disease, or unspecified chronic kidney disease: Secondary | ICD-10-CM | POA: Diagnosis present

## 2021-06-12 DIAGNOSIS — G20A1 Parkinson's disease without dyskinesia, without mention of fluctuations: Secondary | ICD-10-CM | POA: Diagnosis present

## 2021-06-12 DIAGNOSIS — Z833 Family history of diabetes mellitus: Secondary | ICD-10-CM

## 2021-06-12 DIAGNOSIS — Z66 Do not resuscitate: Secondary | ICD-10-CM | POA: Diagnosis present

## 2021-06-12 DIAGNOSIS — E1151 Type 2 diabetes mellitus with diabetic peripheral angiopathy without gangrene: Secondary | ICD-10-CM | POA: Diagnosis present

## 2021-06-12 DIAGNOSIS — R4020 Unspecified coma: Secondary | ICD-10-CM

## 2021-06-12 DIAGNOSIS — R54 Age-related physical debility: Secondary | ICD-10-CM | POA: Diagnosis present

## 2021-06-12 DIAGNOSIS — G20C Parkinsonism, unspecified: Secondary | ICD-10-CM | POA: Diagnosis present

## 2021-06-12 DIAGNOSIS — F419 Anxiety disorder, unspecified: Secondary | ICD-10-CM | POA: Diagnosis present

## 2021-06-12 DIAGNOSIS — E1122 Type 2 diabetes mellitus with diabetic chronic kidney disease: Secondary | ICD-10-CM | POA: Diagnosis present

## 2021-06-12 DIAGNOSIS — Z7901 Long term (current) use of anticoagulants: Secondary | ICD-10-CM

## 2021-06-12 DIAGNOSIS — E1311 Other specified diabetes mellitus with ketoacidosis with coma: Secondary | ICD-10-CM

## 2021-06-12 DIAGNOSIS — Z87891 Personal history of nicotine dependence: Secondary | ICD-10-CM

## 2021-06-12 DIAGNOSIS — R0682 Tachypnea, not elsewhere classified: Secondary | ICD-10-CM

## 2021-06-12 DIAGNOSIS — G9349 Other encephalopathy: Secondary | ICD-10-CM | POA: Diagnosis present

## 2021-06-12 DIAGNOSIS — Z8673 Personal history of transient ischemic attack (TIA), and cerebral infarction without residual deficits: Secondary | ICD-10-CM

## 2021-06-12 DIAGNOSIS — L89150 Pressure ulcer of sacral region, unstageable: Secondary | ICD-10-CM | POA: Diagnosis present

## 2021-06-12 DIAGNOSIS — R269 Unspecified abnormalities of gait and mobility: Secondary | ICD-10-CM | POA: Diagnosis present

## 2021-06-12 DIAGNOSIS — E870 Hyperosmolality and hypernatremia: Secondary | ICD-10-CM

## 2021-06-12 DIAGNOSIS — Z515 Encounter for palliative care: Secondary | ICD-10-CM

## 2021-06-12 DIAGNOSIS — Z8249 Family history of ischemic heart disease and other diseases of the circulatory system: Secondary | ICD-10-CM

## 2021-06-12 LAB — COMPREHENSIVE METABOLIC PANEL
ALT: 7 U/L (ref 0–44)
AST: 24 U/L (ref 15–41)
Albumin: 2.5 g/dL — ABNORMAL LOW (ref 3.5–5.0)
Alkaline Phosphatase: 85 U/L (ref 38–126)
Anion gap: 17 — ABNORMAL HIGH (ref 5–15)
BUN: 140 mg/dL — ABNORMAL HIGH (ref 8–23)
CO2: 19 mmol/L — ABNORMAL LOW (ref 22–32)
Calcium: 9.5 mg/dL (ref 8.9–10.3)
Chloride: 116 mmol/L — ABNORMAL HIGH (ref 98–111)
Creatinine, Ser: 4.04 mg/dL — ABNORMAL HIGH (ref 0.61–1.24)
GFR, Estimated: 14 mL/min — ABNORMAL LOW (ref >60)
Glucose, Bld: 764 mg/dL (ref 70–99)
Potassium: 5.2 mmol/L — ABNORMAL HIGH (ref 3.5–5.1)
Sodium: 152 mmol/L — ABNORMAL HIGH (ref 135–145)
Total Bilirubin: 1.1 mg/dL (ref 0.3–1.2)
Total Protein: 6.7 g/dL (ref 6.5–8.1)

## 2021-06-12 LAB — RESP PANEL BY RT-PCR (FLU A&B, COVID) ARPGX2
Influenza A by PCR: NEGATIVE
Influenza B by PCR: NEGATIVE
SARS Coronavirus 2 by RT PCR: POSITIVE — AB

## 2021-06-12 LAB — I-STAT VENOUS BLOOD GAS, ED
Acid-base deficit: 5 mmol/L — ABNORMAL HIGH (ref 0.0–2.0)
Bicarbonate: 17.4 mmol/L — ABNORMAL LOW (ref 20.0–28.0)
Calcium, Ion: 1.05 mmol/L — ABNORMAL LOW (ref 1.15–1.40)
HCT: 37 % — ABNORMAL LOW (ref 39.0–52.0)
Hemoglobin: 12.6 g/dL — ABNORMAL LOW (ref 13.0–17.0)
O2 Saturation: 99 %
Potassium: 6.1 mmol/L — ABNORMAL HIGH (ref 3.5–5.1)
Sodium: 150 mmol/L — ABNORMAL HIGH (ref 135–145)
TCO2: 18 mmol/L — ABNORMAL LOW (ref 22–32)
pCO2, Ven: 26.4 mmHg — ABNORMAL LOW (ref 44.0–60.0)
pH, Ven: 7.428 (ref 7.250–7.430)
pO2, Ven: 113 mmHg — ABNORMAL HIGH (ref 32.0–45.0)

## 2021-06-12 LAB — BASIC METABOLIC PANEL
Anion gap: 17 — ABNORMAL HIGH (ref 5–15)
BUN: 145 mg/dL — ABNORMAL HIGH (ref 8–23)
CO2: 19 mmol/L — ABNORMAL LOW (ref 22–32)
Calcium: 9.4 mg/dL (ref 8.9–10.3)
Chloride: 116 mmol/L — ABNORMAL HIGH (ref 98–111)
Creatinine, Ser: 4.02 mg/dL — ABNORMAL HIGH (ref 0.61–1.24)
GFR, Estimated: 14 mL/min — ABNORMAL LOW (ref >60)
Glucose, Bld: 790 mg/dL (ref 70–99)
Potassium: 5.4 mmol/L — ABNORMAL HIGH (ref 3.5–5.1)
Sodium: 152 mmol/L — ABNORMAL HIGH (ref 135–145)

## 2021-06-12 LAB — CBG MONITORING, ED
Glucose-Capillary: 561 mg/dL (ref 70–99)
Glucose-Capillary: 563 mg/dL (ref 70–99)

## 2021-06-12 LAB — CBC
HCT: 42.8 % (ref 39.0–52.0)
Hemoglobin: 12.8 g/dL — ABNORMAL LOW (ref 13.0–17.0)
MCH: 28.8 pg (ref 26.0–34.0)
MCHC: 29.9 g/dL — ABNORMAL LOW (ref 30.0–36.0)
MCV: 96.4 fL (ref 80.0–100.0)
Platelets: 249 10*3/uL (ref 150–400)
RBC: 4.44 MIL/uL (ref 4.22–5.81)
RDW: 13.1 % (ref 11.5–15.5)
WBC: 7.7 10*3/uL (ref 4.0–10.5)
nRBC: 0 % (ref 0.0–0.2)

## 2021-06-12 LAB — DIFFERENTIAL
Abs Immature Granulocytes: 0.05 10*3/uL (ref 0.00–0.07)
Basophils Absolute: 0 10*3/uL (ref 0.0–0.1)
Basophils Relative: 0 %
Eosinophils Absolute: 0 10*3/uL (ref 0.0–0.5)
Eosinophils Relative: 0 %
Immature Granulocytes: 1 %
Lymphocytes Relative: 14 %
Lymphs Abs: 1 10*3/uL (ref 0.7–4.0)
Monocytes Absolute: 0.5 10*3/uL (ref 0.1–1.0)
Monocytes Relative: 7 %
Neutro Abs: 5.7 10*3/uL (ref 1.7–7.7)
Neutrophils Relative %: 78 %

## 2021-06-12 LAB — APTT: aPTT: 31 seconds (ref 24–36)

## 2021-06-12 LAB — PROTIME-INR
INR: 2.2 — ABNORMAL HIGH (ref 0.8–1.2)
Prothrombin Time: 24 seconds — ABNORMAL HIGH (ref 11.4–15.2)

## 2021-06-12 LAB — ETHANOL: Alcohol, Ethyl (B): <10 mg/dL (ref <10)

## 2021-06-12 LAB — AMMONIA: Ammonia: 12 umol/L (ref 9–35)

## 2021-06-12 MED ORDER — CARBIDOPA-LEVODOPA 25-100 MG PO TABS: 1.0000 | ORAL_TABLET | Freq: Two times a day (BID) | ORAL | Status: DC

## 2021-06-12 MED ORDER — POLYETHYLENE GLYCOL 3350 17 G PO PACK: 17.0000 g | PACK | Freq: Every day | ORAL | Status: DC | PRN

## 2021-06-12 MED ORDER — SODIUM CHLORIDE 0.9 % IV SOLN: 100.0000 mL/h | INTRAVENOUS | Status: DC

## 2021-06-12 MED ORDER — ACETAMINOPHEN 325 MG PO TABS
650.0000 mg | ORAL_TABLET | Freq: Four times a day (QID) | ORAL | Status: DC | PRN
Start: 2021-06-12 — End: 2021-06-15

## 2021-06-12 MED ORDER — ACETAMINOPHEN 650 MG RE SUPP
650.0000 mg | Freq: Four times a day (QID) | RECTAL | Status: DC | PRN
  Administered 2021-06-15: 650 mg via RECTAL
  Filled 2021-06-12: qty 1

## 2021-06-12 MED ORDER — DEXTROSE IN LACTATED RINGERS 5 % IV SOLN: INTRAVENOUS | Status: DC

## 2021-06-12 MED ORDER — LACTATED RINGERS IV SOLN: INTRAVENOUS | Status: DC

## 2021-06-12 MED ORDER — LACTATED RINGERS IV BOLUS
1500.0000 mL | Freq: Once | INTRAVENOUS | Status: AC
  Administered 2021-06-12: 1000 mL via INTRAVENOUS

## 2021-06-12 MED ORDER — SODIUM CHLORIDE 0.9% FLUSH
3.0000 mL | Freq: Two times a day (BID) | INTRAVENOUS | Status: DC
  Administered 2021-06-13 – 2021-06-17 (×10): 3 mL via INTRAVENOUS

## 2021-06-12 MED ORDER — DEXTROSE 50 % IV SOLN: 0.0000 mL | INTRAVENOUS | Status: DC | PRN

## 2021-06-12 MED ORDER — INSULIN REGULAR(HUMAN) IN NACL 100-0.9 UT/100ML-% IV SOLN
INTRAVENOUS | Status: DC
  Administered 2021-06-12: 9.5 [IU]/h via INTRAVENOUS
  Filled 2021-06-12 (×2): qty 100

## 2021-06-12 MED ORDER — SODIUM CHLORIDE 0.9 % IV BOLUS
500.0000 mL | Freq: Once | INTRAVENOUS | Status: AC
  Administered 2021-06-12: 500 mL via INTRAVENOUS

## 2021-06-12 NOTE — ED Notes (Signed)
Wife Tremond Shimabukuro [phone] would like an update

## 2021-06-12 NOTE — H&P (Signed)
History and Physical   Dylan [name]. PNT:[id number] DOB: 1941/04/20 DOA: 06/28/2021  PCP: [name], MD   Patient coming from: [facility] facility  Chief Complaint: Altered mental status  HPI: Dylan [name]. is a 80 y.o. male with medical history significant of CKD 3, paroxysmal A. fib, complete AV block status post pacemaker, dementia, Parkinson's, PAD, hypertension, gout, hyperlipidemia, orthostatic hypotension, diabetes, TIA who presents with altered mental status. History obtained with assistance of chart review and family due to patient's encephalopathy.  Patient has had 3 days of declining mentation and was sent to the ED by EMS as he was no longer responding at all.  He is total care at baseline due to his significant dementia he typically does not communicate much with anyone except for his wife who comes and visits him at the facility daily.  He does require total care at baseline. Wife reports she helps feed him but felt like he did not get enough fluid at the facility. Not eating much at all for a week.  ED Course: Vital signs in the ED significant for respiratory rate in the 20s to 30s.  Lab work-up showed CMP with sodium 152, chloride 116, potassium 5.2, bicarb 19, gap 17, BUN 140, creatinine 4 up from baseline of around 1, glucose 764, albumin 2.5.  CBC with hemoglobin stable at 12.8.  PT and INR elevated at 24 and 2.2 respectively.  Ethanol level negative.  UDS and urinalysis pending.  Ammonia pending.  VBG with normal pH and PCO2 of 26.  Respiratory panel for flu and COVID positive for COVID.  Chest x-ray with no acute normality, right knee x-ray with no acute normality, CT head with no acute abnormality, renal ultrasound pending.  Patient received 2 L of IV fluids and was started on a insulin drip as well as a rate of fluids in the ED.  Review of Systems: Unable to be performed due to patient's encephalopathy.  Past Medical History:  Diagnosis Date    Anxiety    Aortic stenosis    mild AS 11/24/16 (peak grad 24, mean grad 11) Dr. 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 [name])   Vitamin B12 deficiency anemia 03/01/2018   Vitamin D deficiency disease     Past Surgical History:  Procedure Laterality Date   CARDIOVASCULAR STRESS TEST     11/21/16 Low risk study, EF 52% [facility])   EYE SURGERY     KYPHOPLASTY N/A 02/15/2017   Procedure: LUMBAR FOUR KYPHOPLASTY;  Surgeon: Phylliss Bob, MD;  Location: [facility];  Service: Orthopedics;  Laterality: N/A;   lipoma surgery     neck - 30 years ago   PACEMAKER IMPLANT N/A 07/11/2017   Procedure: Pacemaker Implant;  Surgeon: Constance Haw, MD;  Location: [facility] CV LAB;  Service: Cardiovascular;  Laterality: N/A;  TRANSTHORACIC ECHOCARDIOGRAM     11/24/16 [facility]): EF 55-60%, mild AS, mild-mod MR, mod TR, moderate pulm HTN, PAP 49 mmHg    Social History  reports that he has quit smoking. His smoking use included cigarettes. He has a 3.50 pack-year smoking history. He has never used smokeless tobacco. He reports that he does not drink alcohol and does not use drugs.  Allergies  Allergen Reactions   Quetiapine Other (See Comments)    Agitation     Family History  Problem Relation Age of Onset   Diabetes Mother    Breast cancer Mother    Arthritis Mother    Hypertension Father     Diabetes Sister    Diabetes Brother    Diabetes Maternal Grandmother    Diabetes Brother    Diabetes Brother    Diabetes Brother    Diabetes Sister    Diabetes Sister    Diabetes Sister   Reviewed on admission  Prior to Admission medications   Medication Sig Start Date End Date Taking? Authorizing Provider  apixaban (ELIQUIS) 5 MG TABS tablet Take 1 tablet (5 mg total) by mouth 2 (two) times daily. 02/11/21   Danford, Suann Larry, MD  Ascorbic Acid (VITAMIN C) 1000 MG tablet Take 1,000 mg by mouth every morning.     [provider]  carbidopa-levodopa (SINEMET IR) 25-100 MG tablet Take 2 tablets by mouth 3 (three) times daily. Patient taking differently: Take 1 tablet by mouth See admin instructions. Take 1 tablet by mouth at 10 AM and 7 PM 12/24/20   Marcial Pacas, MD  carvedilol (COREG) 12.5 MG tablet TAKE 1 TABLET BY MOUTH 2 TIMES DAILY. Patient taking differently: Take 12.5 mg by mouth 2 (two) times daily with a meal. 07/07/20   [name], MD  Cholecalciferol (VITAMIN D3) 5000 units CAPS Take 5,000 Units by mouth at bedtime.    [provider]  desonide (DESOWEN) 0.05 % lotion Apply topically 2 times per day prn Patient taking differently: Apply 1 application topically 2 (two) times daily as needed (for irritation- to affected areas of the face). 06/17/20   [name], MD  diclofenac Sodium (VOLTAREN) 1 % GEL Apply 2 g topically 3 (three) times daily. Patient taking differently: Apply 2 g topically as needed (to painful sites). 10/10/19   Rodriguez-Southworth, Sunday Spillers, PA-C  glucose blood (ONETOUCH VERIO) test strip Use as instructed to check blood sugars daily dx: e11.22 01/06/20   [name], MD  Glycerin (OASIS TEARS PF OP) Place 1 drop into both eyes in the morning.    [provider]  Lancet Devices (ONE TOUCH DELICA LANCING DEV) MISC Please fill ONE TOUCH DELICA LANCING DEVICE.  Use to test blood sugar twice daily as directed. E11.65 Patient taking  differently: Please fill ONE TOUCH DELICA LANCING DEVICE.  Use to test blood sugar twice daily as directed. E11.65 11/15/19   [name], MD  Lancets MISC Please fill basic/generic push button lancets.  Patient to test blood sugar twice daily. DX: E11.65 12/06/19   [name], MD  linaclotide Rolan Lipa) 72 MCG capsule Take 1 capsule (72 mcg total) by mouth daily before breakfast. Patient not taking: No sig reported 12/19/20   [name], MD  Melatonin 10 MG TABS Take 10-20 mg by mouth at bedtime as needed (for sleep).    [provider]  memantine (NAMENDA) 10 MG tablet TAKE 1 TABLET BY MOUTH TWICE A DAY Patient taking differently: Take 10 mg by mouth 2 (two)  times daily. 07/07/20   [name], MD  metFORMIN (GLUCOPHAGE) 1000 MG tablet Take 1 tablet by mouth daily Patient taking differently: Take 1,000 mg by mouth daily with breakfast. 12/24/20   [name], MD  pravastatin (PRAVACHOL) 20 MG tablet TAKE 1 TABLET BY ORAL ROUTE EVERY DAY Patient taking differently: Take 20 mg by mouth daily. 12/01/20   [name], MD  QUEtiapine (SEROQUEL) 25 MG tablet TAKE 1 TABLET (25 MG TOTAL) BY MOUTH AT BEDTIME AS NEEDED. 08/21/20   Marcial Pacas, MD  rivastigmine (EXELON) 4.6 mg/24hr Place 1 patch (4.6 mg total) onto the skin daily. Patient taking differently: Place 4.6 mg onto the skin every evening. 10/26/20   Suzzanne Cloud, NP  senna-docusate (SENOKOT-S) 8.6-50 MG tablet Take 1 tablet by mouth every other day.    [provider]  TRULICITY 1.5 VH/8.4ON SOPN INJECT 1.5 MG INTO THE SKIN ONCE A WEEK. Patient taking differently: Inject 1.5 mg into the skin every Monday. 09/07/20   [name], MD    Physical Exam: Vitals:   06/21/2021 2059 06/08/2021 2101 06/30/2021 2245 06/10/2021 2300  BP: 122/72  123/77 133/77  Pulse: 89   88  Resp: 18  (!) 33 (!) 32  Temp:  97.6 F (36.4 C)  (!) 96.4 F (35.8 C)  TempSrc:  Oral  Axillary  SpO2: 100%   100%   Physical Exam Constitutional:       General: He is not in acute distress.    Appearance: Normal appearance.     Comments: Chronically ill appearing, thin, elderly male.  Responsive only to painful stimuli.  Will open eyes.  HENT:     Head: Normocephalic and atraumatic.     Mouth/Throat:     Mouth: Mucous membranes are moist.     Pharynx: Oropharynx is clear.  Eyes:     Extraocular Movements: Extraocular movements intact.     Pupils: Pupils are equal, round, and reactive to light.  Cardiovascular:     Rate and Rhythm: Normal rate and regular rhythm.     Pulses: Normal pulses.     Heart sounds: Normal heart sounds.  Pulmonary:     Effort: Pulmonary effort is normal. No respiratory distress.     Breath sounds: Normal breath sounds.  Abdominal:     General: Bowel sounds are normal. There is no distension.     Palpations: Abdomen is soft.     Tenderness: There is no abdominal tenderness.  Musculoskeletal:        General: No swelling or deformity.     Comments: Some coolness and swelling of his left hand, unclear if there was an issue with an IV.  Skin:    General: Skin is warm and dry.  Neurological:     Comments: Chronically ill appearing, thin, elderly male.  Responsive only to painful stimuli.  Will open eyes.   Labs on Admission: I have personally reviewed following labs and imaging studies  CBC: Recent Labs  Lab 06/06/2021 2039 06/14/2021 2116  WBC 7.7  --   NEUTROABS 5.7  --   HGB 12.8* 12.6*  HCT 42.8 37.0*  MCV 96.4  --   PLT 249  --     Basic Metabolic Panel: Recent Labs  Lab 06/07/2021 2039 06/02/2021 2116 06/17/2021 2300  NA 152* 150* 152*  K 5.2* 6.1* 5.4*  CL 116*  --  116*  CO2 19*  --  19*  GLUCOSE 764*  --  790*  BUN 140*  --  145*  CREATININE 4.04*  --  4.02*  CALCIUM 9.5  --  9.4    GFR: CrCl cannot be calculated (Unknown ideal weight.).  Liver Function Tests: Recent Labs  Lab 06/29/2021 2039  AST 24  ALT 7  ALKPHOS 85  BILITOT 1.1  PROT 6.7  ALBUMIN 2.5*    Urine  analysis:    Component Value Date/Time   COLORURINE YELLOW 02/10/2021 0832   APPEARANCEUR CLEAR 02/10/2021 0832   LABSPEC 1.036 (H) 02/10/2021 0832   PHURINE 6.0 02/10/2021 0832   GLUCOSEU NEGATIVE 02/10/2021 0832   HGBUR NEGATIVE 02/10/2021 0832   BILIRUBINUR NEGATIVE 02/10/2021 0832   BILIRUBINUR negative 11/03/2020 1747   KETONESUR 5 (A) 02/10/2021 0832   PROTEINUR NEGATIVE 02/10/2021 0832   UROBILINOGEN 0.2 11/03/2020 1747   NITRITE NEGATIVE 02/10/2021 0832   LEUKOCYTESUR NEGATIVE 02/10/2021 0832    Radiological Exams on Admission: DG Chest 1 View  Result Date: 06/02/2021 CLINICAL DATA:  Altered mental status EXAM: CHEST  1 VIEW COMPARISON:  02/14/2021 FINDINGS: Lungs are clear.  No pleural effusion or pneumothorax. The heart is normal in size.  Left subclavian pacemaker. IMPRESSION: No evidence of acute cardiopulmonary disease. Electronically Signed   By: Julian Hy M.D.   On: 06/05/2021 21:42   DG Knee 2 Views Right  Result Date: 06/24/2021 CLINICAL DATA:  Pain EXAM: RIGHT KNEE - 1-2 VIEW COMPARISON:  None. FINDINGS: No fracture or dislocation is seen. The joint spaces are preserved. The visualized soft tissues are unremarkable. No suprapatellar knee joint effusion. IMPRESSION: Negative. Electronically Signed   By: Julian Hy M.D.   On: 06/06/2021 21:42   CT HEAD WO CONTRAST  Result Date: 06/07/2021 CLINICAL DATA:  Altered mental status EXAM: CT HEAD WITHOUT CONTRAST TECHNIQUE: Contiguous axial images were obtained from the base of the skull through the vertex without intravenous contrast. COMPARISON:  02/27/2021 FINDINGS: Brain: No evidence of acute infarction, hemorrhage, hydrocephalus, extra-axial collection or mass lesion/mass effect. Global cortical and central atrophy. Subcortical white matter and periventricular small vessel ischemic changes. Vascular: Intracranial atherosclerosis. Skull: Normal. Negative for fracture or focal lesion. Sinuses/Orbits: The  visualized paranasal sinuses are essentially clear. The mastoid air cells are unopacified. Other: None. IMPRESSION: No evidence of acute intracranial abnormality. Atrophy with small vessel ischemic changes. Electronically Signed   By: Julian Hy M.D.   On: 06/11/2021 21:27    EKG: Independently reviewed.  Paced rhythm with some breakthrough sinus beats.  86 bpm.  Left bundle branch block versus ventricular paced.  Similar to previous.   Assessment/Plan Principal Problem:   Acute encephalopathy Active Problems:   Peripheral arterial disease (HCC)   Type 2 diabetes mellitus with stage 3a chronic kidney disease, without long-term current use of insulin (HCC)   Parkinsonism ([place])   Pacemaker Dual chamber [employer] MRI  model [id number] 07/11/2020   Parkinson's disease ([place])   Hyperosmolar hyperglycemic state (HHS) ([place])   COVID-19 virus infection  Acute encephalopathy > Likely multifactorial and in the setting of significant Parkinson's dementia. > Noted to be dehydrated with AKI as below as well as hypernatremia as below and DKA versus HHS as below. > No evidence of infectious process at this time.  Potassium is at 5.2 and calcium is normal.  Will check magnesium. - Monitor on progressive unit - Monitor response to correction of AKI and hypernatremia as well as treatment of DKA/HHS - Add on magnesium and TSH for completeness  DKA/HHS > Presenting dehydrated as above.  Likely HHS.  Found to have glucose of 764.  Bicarb 19, gap 17.  Could be merely due to elevated BUN and significant HHS versus DKA. > Ketone levels and urine and blood are pending. > Patient received 2 L of IV fluids and was initiated on a rate of fluids as well as insulin drip in the ED. - Monitor on progressive unit - Continue on insulin drip - No potassium supplementation as potassium is greater than 5 - LR at 125 mL/hr until CBG less than 250 - Switch to D5-LR when 1 CBG less than 250 - Nothing by mouth   - BMET every 4 hours - CBG Q1H - Once anion gap closed 2, start CM diet and if able to eat, initiate sliding scale insulin (unclear currently taking insulin at facility, his wife states that he would only take it once a week before he was there). - DC fluids if eating, drinking, and off insulin drip  Hyponatremia AKI on CKD 3B > Creatinine elevated to 4 from baseline of 1.  Sodium elevated to 152. > Significant dehydration likely due to decreased p.o. intake and HHS.  Has received 2 L in the ED and started on a rate. > We will be trending BMPs for his HHS so we will monitor his response to treatment with IV fluids. - Trend BMP - Avoid nephrotoxic agents - Follow-up renal ultrasound - May need to switch fluids to D5W if not improving  Covid-19 Positivity  > Fully vaccinated. Incidental finding, no known respiratory  - No steroids for now - Add a candidate for Paxil elevated with severe AKI - Remdesivir per pharmacy consult  Parkinson's dementia - Resume Sinemet when tolerating p.o. - We will hold off on rivastigmine, Seroquel, Namenda while altered; also awaiting pharmacy medication verification.  Hyperlipidemia - Plan to continue pravastatin when able,b and medications are confirmed  Paroxysmal A. fib Complete heart block status post pacemaker - Hold Eliquis for now.  Will evaluate for resumption if renal function improving.  His PT and INR likely elevated in the setting of buildup of Eliquis in setting of his AKI. - We will plan to continue carvedilol when confirmed and tolerating p.o.  History of PAD and hypertension - No medications listed for these we will wait on confirmation of medications.  DVT prophylaxis: Is on Eliquis, likely supratherapeutic in the setting of his AKI, no additional chemoprophylaxis for now, will order SCDs  Code Status:   DNR  Family Communication:  Case discussed with wife by phone.  Confirmed patient is DNR. Disposition Plan:   Patient is  from:  [place] to:  Same as above  Anticipated DC date:  1 to 5 days  Anticipated DC barriers: None  Consults called:  None  Admission status:  Observation, progressive   Severity of Illness: The appropriate patient status for this patient is OBSERVATION. Observation status is judged to be reasonable and necessary in order to provide the required intensity of service to ensure the patient's safety. The patient's presenting symptoms, physical exam findings, and initial radiographic and laboratory data in the context of their medical condition is felt to place them at decreased risk for further clinical deterioration. Furthermore, it is anticipated that the patient will be medically stable for discharge from the hospital within 2 midnights of admission. The following factors support the patient status of observation.   " The patient's presenting symptoms include encephalopathy. " The physical exam findings include encephalopathy, tachypnea, unresponsiveness. " The  initial radiographic and laboratory data are Lab work-up showed CMP with sodium 152, chloride 116, potassium 5.2, bicarb 19, gap 17, BUN 140, creatinine 4 up from baseline of around 1, glucose 764, albumin 2.5.  CBC with hemoglobin stable at 12.8.  PT and INR elevated at 24 and 2.2 respectively.  Ethanol level negative.  UDS and urinalysis pending.  Ammonia pending.  VBG with normal pH and PCO2 of 26.  Respiratory panel for flu and COVID positive for COVID.  Chest x-ray with no acute normality, right knee x-ray with no acute normality, CT head with no acute abnormality, renal ultrasound pending.   Marcelyn Bruins MD [facility]  How to contact the [facility] Attending or Consulting provider [place] or covering provider during after hours [place], for this patient?   Check the care team in [facility] and look for a) attending/consulting TR[facility] provider listed and b) the [facility] team listed Log into  [email or web address] and use [facility]'s universal password to access. If you do not have the password, please contact the hospital operator. Locate the [facility] provider you are looking for under [facility] and page to a number that you can be directly reached. If you still have difficulty reaching the provider, please page the [facility] (Director on Call) for the Hospitalists listed on amion for assistance.  06/13/2021, 12:13 AM

## 2021-06-12 NOTE — ED Notes (Signed)
Glucose 764.MD made aware

## 2021-06-12 NOTE — ED Provider Notes (Signed)
[facility] EMERGENCY DEPARTMENT Provider Note   CSN: [id number] Arrival date & time: 06/03/2021  2043     History Chief complaint: Altered mental status  Dylan [name]. is a 80 y.o. male.  HPI  Patient resides at a nursing facility.  He has multiple comorbidities including dementia.  Patient requires total care at this point.  Patient normally does respond to his wife somewhat.  Over the last few days he has had a decline in his mental status according to the nursing facility.  He is not responding whatsoever.  No known fevers.  No known vomiting.  Patient is unable to communicate with me.  He does not respond to verbal stimuli.  He does grimace during part of the exam when I attempted to move his right knee  Past Medical History:  Diagnosis Date   Anxiety    Aortic stenosis    mild AS 11/24/16 (peak grad 24, mean grad 11) Dr. 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   DNR (do not resuscitate) 06/08/2020   Elevated CK 04/27/2020   Gout    Hyperlipemia    Parkinsonism ([place]) 01/29/2020   Lung granuloma ([place]) 07/24/2019   Dementia with behavioral disturbance ([place]) 05/22/2019   Gait abnormality 05/22/2019   AKI (acute kidney injury) ([place]) 07/14/2018   Nephropathy 07/14/2018   Other disorders of lung 07/14/2018   Atherosclerosis of aorta ([place]) 07/14/2018   Dehydration 07/14/2018   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   CKD (chronic kidney disease), stage III ([name]) 06/13/2018   Syncope 06/13/2018   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[place]) 06/23/2015   Peripheral arterial disease ([place]) 05/13/2014   Essential hypertension 05/13/2014   Type 2 diabetes mellitus with stage 3a chronic kidney disease, without long-term current use of insulin ([place]) 05/13/2014    Past Surgical History:  Procedure Laterality Date   CARDIOVASCULAR STRESS TEST     11/21/16 Low risk study, EF 52% [facility])   EYE SURGERY     KYPHOPLASTY N/A 02/15/2017   Procedure: LUMBAR FOUR KYPHOPLASTY;  Surgeon: Phylliss Bob, MD;  Location: [place];  Service: Orthopedics;  Laterality: N/A;   lipoma surgery     neck - 30 years ago   PACEMAKER IMPLANT N/A 07/11/2017   Procedure: Pacemaker Implant;  Surgeon: Constance Haw, MD;  Location:  [place] INVASIVE CV LAB;  Service: Cardiovascular;  Laterality: N/A;   TRANSTHORACIC ECHOCARDIOGRAM     11/24/16 [facility] CV): EF 55-60%, mild AS, mild-mod MR, mod TR, moderate pulm HTN, PAP 49 mmHg       Family History  Problem Relation Age of Onset   Diabetes Mother    Breast cancer Mother    Arthritis Mother    Hypertension Father    Diabetes Sister    Diabetes Brother    Diabetes Maternal Grandmother    Diabetes Brother    Diabetes Brother    Diabetes Brother    Diabetes Sister    Diabetes Sister    Diabetes Sister      Social History   Tobacco Use   Smoking status: Former    Packs/day: 0.50    Years: 7.00    Pack years: 3.50    Types: Cigarettes   Smokeless tobacco: Never   Tobacco comments:    quit 35 years  Vaping Use   Vaping Use: Never used  Substance Use Topics   Alcohol use: No   Drug use: No    Home Medications Prior to Admission medications   Medication Sig Start Date End Date Taking? Authorizing Provider  apixaban (ELIQUIS) 5 MG TABS tablet Take 1 tablet (5 mg total) by mouth 2 (two) times daily. 02/11/21   Danford, Suann Larry, MD  Ascorbic Acid (VITAMIN C) 1000 MG tablet Take 1,000 mg by mouth every morning.     [provider]  carbidopa-levodopa (SINEMET IR) 25-100 MG tablet Take 2 tablets by mouth 3 (three) times daily. Patient taking differently: Take 1 tablet by mouth See admin instructions. Take 1 tablet by mouth at 10 AM and 7 PM 12/24/20   Marcial Pacas, MD  carvedilol (COREG) 12.5 MG tablet TAKE 1 TABLET BY MOUTH 2 TIMES DAILY. Patient taking differently: Take 12.5 mg by mouth 2 (two) times daily with a meal. 07/07/20   [name], MD  Cholecalciferol (VITAMIN D3) 5000 units CAPS Take 5,000 Units by mouth at bedtime.    [provider]  desonide (DESOWEN) 0.05 % lotion Apply topically 2 times per day prn Patient taking differently: Apply 1 application topically 2 (two) times daily as needed (for irritation- to affected areas of the face). 06/17/20   [name], MD  diclofenac Sodium (VOLTAREN) 1 % GEL Apply 2 g topically 3 (three) times daily. Patient taking differently: Apply 2 g topically as needed (to painful sites). 10/10/19   Rodriguez-Southworth, Sunday Spillers, PA-C  glucose blood (ONETOUCH VERIO) test strip Use as instructed to check blood sugars daily dx: e11.22 01/06/20   [name], MD  Glycerin (OASIS TEARS PF OP) Place 1 drop into both eyes in the morning.    [provider]  Lancet Devices (ONE TOUCH DELICA LANCING DEV) MISC Please  fill ONE TOUCH DELICA LANCING DEVICE.  Use to test blood sugar twice daily as directed. E11.65 Patient taking differently: Please fill ONE TOUCH DELICA LANCING DEVICE.  Use to test blood sugar twice daily as directed. E11.65 11/15/19   [name], MD  Lancets MISC Please fill basic/generic push button lancets.  Patient to test blood sugar twice daily. DX: E11.65 12/06/19   [name], MD  linaclotide Rolan Lipa) 72 MCG capsule Take 1 capsule (72 mcg total) by mouth daily before breakfast. Patient not taking: No sig reported 12/19/20   [name], MD  Melatonin 10 MG TABS Take 10-20 mg by mouth at bedtime as needed (for sleep).  [provider]  memantine (NAMENDA) 10 MG tablet TAKE 1 TABLET BY MOUTH TWICE A DAY Patient taking differently: Take 10 mg by mouth 2 (two) times daily. 07/07/20   [name], MD  metFORMIN (GLUCOPHAGE) 1000 MG tablet Take 1 tablet by mouth daily Patient taking differently: Take 1,000 mg by mouth daily with breakfast. 12/24/20   [name], MD  pravastatin (PRAVACHOL) 20 MG tablet TAKE 1 TABLET BY ORAL ROUTE EVERY DAY Patient taking differently: Take 20 mg by mouth daily. 12/01/20   [name], MD  QUEtiapine (SEROQUEL) 25 MG tablet TAKE 1 TABLET (25 MG TOTAL) BY MOUTH AT BEDTIME AS NEEDED. 08/21/20   Marcial Pacas, MD  rivastigmine (EXELON) 4.6 mg/24hr Place 1 patch (4.6 mg total) onto the skin daily. Patient taking differently: Place 4.6 mg onto the skin every evening. 10/26/20   Suzzanne Cloud, NP  senna-docusate (SENOKOT-S) 8.6-50 MG tablet Take 1 tablet by mouth every other day.    [provider]  TRULICITY 1.5 VE/7.2CN SOPN INJECT 1.5 MG INTO THE SKIN ONCE A WEEK. Patient taking differently: Inject 1.5 mg into the skin every Monday. 09/07/20   [name], MD    Allergies    Quetiapine  Review of Systems   Review of Systems  All other systems reviewed and are negative.  Physical Exam Updated Vital Signs BP 122/72 (BP  Location: Right Arm)   Pulse 89   Temp 97.6 F (36.4 C) (Oral)   Resp 18   SpO2 100%   Physical Exam Vitals and nursing note reviewed.  Constitutional:      Appearance: He is well-developed. He is ill-appearing.     Comments: Underweight  HENT:     Head: Normocephalic and atraumatic.     Right Ear: External ear normal.     Left Ear: External ear normal.  Eyes:     General: No scleral icterus.       Right eye: No discharge.        Left eye: No discharge.     Conjunctiva/sclera: Conjunctivae normal.  Neck:     Trachea: No tracheal deviation.  Cardiovascular:     Rate and Rhythm: Normal rate and regular rhythm.  Pulmonary:     Effort: Pulmonary effort is normal. No respiratory distress.     Breath sounds: Normal breath sounds. No stridor. No wheezing or rales.  Abdominal:     General: Bowel sounds are normal. There is no distension.     Palpations: Abdomen is soft.     Tenderness: There is no abdominal tenderness. There is no guarding or rebound.  Musculoskeletal:        General: No tenderness or deformity.     Cervical back: Neck supple.     Comments: Right knee without erythema, patient does not want to extend his leg, and he holds his knee flexed and resists with attempted range of motion, otherwise no tenderness or swelling noted in his extremities  Skin:    General: Skin is warm and dry.     Findings: No rash.  Neurological:     Cranial Nerves: No cranial nerve deficit (No facial droop noted, patient is nonverbal eyes are closed).     Motor: Abnormal muscle tone present. No tremor or seizure activity.     Comments: Patient does not follow commands, does not move his extremities  Psychiatric:        Mood and Affect: Mood normal.    ED Results / Procedures / Treatments  Labs (all labs ordered are listed, but only abnormal results are displayed) Labs Reviewed  RESP PANEL BY RT-PCR (FLU A&B, COVID) ARPGX2 - Abnormal; Notable for the following components:      Result  Value   SARS Coronavirus 2 by RT PCR POSITIVE (*)    All other components within normal limits  PROTIME-INR - Abnormal; Notable for the following components:   Prothrombin Time 24.0 (*)    INR 2.2 (*)    All other components within normal limits  CBC - Abnormal; Notable for the following components:   Hemoglobin 12.8 (*)    MCHC 29.9 (*)    All other components within normal limits  COMPREHENSIVE METABOLIC PANEL - Abnormal; Notable for the following components:   Sodium 152 (*)    Potassium 5.2 (*)    Chloride 116 (*)    CO2 19 (*)    Glucose, Bld 764 (*)    BUN 140 (*)    Creatinine, Ser 4.04 (*)    Albumin 2.5 (*)    GFR, Estimated 14 (*)    Anion gap 17 (*)    All other components within normal limits  I-STAT VENOUS BLOOD GAS, ED - Abnormal; Notable for the following components:   pCO2, Ven 26.4 (*)    pO2, Ven 113.0 (*)    Bicarbonate 17.4 (*)    TCO2 18 (*)    Acid-base deficit 5.0 (*)    Sodium 150 (*)    Potassium 6.1 (*)    Calcium, Ion 1.05 (*)    HCT 37.0 (*)    Hemoglobin 12.6 (*)    All other components within normal limits  ETHANOL  APTT  DIFFERENTIAL  RAPID URINE DRUG SCREEN, HOSP PERFORMED  URINALYSIS, ROUTINE W REFLEX MICROSCOPIC  AMMONIA  BASIC METABOLIC PANEL  BASIC METABOLIC PANEL  BASIC METABOLIC PANEL  BASIC METABOLIC PANEL  BETA-HYDROXYBUTYRIC ACID  BETA-HYDROXYBUTYRIC ACID  CBG MONITORING, ED    EKG EKG Interpretation  Date/Time:  Saturday June 12 2021 20:49:31 EDT Ventricular Rate:  86 PR Interval:  210 QRS Duration: 131 QT Interval:  450 QTC Calculation: 539 R Axis:   -39 Text Interpretation: Sinus rhythm Multiple premature complexes, vent & supraven Left bundle branch block No significant change since last tracing Confirmed by Dorie Rank [phone]) on 06/17/2021 8:51:49 PM  Radiology DG Chest 1 View  Result Date: 06/16/2021 CLINICAL DATA:  Altered mental status EXAM: CHEST  1 VIEW COMPARISON:  02/14/2021 FINDINGS: Lungs are  clear.  No pleural effusion or pneumothorax. The heart is normal in size.  Left subclavian pacemaker. IMPRESSION: No evidence of acute cardiopulmonary disease. Electronically Signed   By: Julian Hy M.D.   On: 06/13/2021 21:42   DG Knee 2 Views Right  Result Date: 06/09/2021 CLINICAL DATA:  Pain EXAM: RIGHT KNEE - 1-2 VIEW COMPARISON:  None. FINDINGS: No fracture or dislocation is seen. The joint spaces are preserved. The visualized soft tissues are unremarkable. No suprapatellar knee joint effusion. IMPRESSION: Negative. Electronically Signed   By: Julian Hy M.D.   On: 06/07/2021 21:42   CT HEAD WO CONTRAST  Result Date: 06/04/2021 CLINICAL DATA:  Altered mental status EXAM: CT HEAD WITHOUT CONTRAST TECHNIQUE: Contiguous axial images were obtained from the base of the skull through the vertex without intravenous contrast. COMPARISON:  02/27/2021 FINDINGS: Brain: No evidence of acute infarction, hemorrhage, hydrocephalus, extra-axial collection or mass lesion/mass effect. Global cortical and central atrophy. Subcortical white matter and periventricular small vessel ischemic changes. Vascular: Intracranial  atherosclerosis. Skull: Normal. Negative for fracture or focal lesion. Sinuses/Orbits: The visualized paranasal sinuses are essentially clear. The mastoid air cells are unopacified. Other: None. IMPRESSION: No evidence of acute intracranial abnormality. Atrophy with small vessel ischemic changes. Electronically Signed   By: Julian Hy M.D.   On: 06/19/2021 21:27    Procedures .Critical Care  Date/Time: 06/09/2021 10:53 PM Performed by: Dorie Rank, MD Authorized by: Dorie Rank, MD   Critical care provider statement:    Critical care time (minutes):  45   Critical care was time spent personally by me on the following activities:  Discussions with consultants, evaluation of patient's response to treatment, examination of patient, ordering and performing treatments and  interventions, ordering and review of laboratory studies, ordering and review of radiographic studies, pulse oximetry, re-evaluation of patient's condition, obtaining history from patient or surrogate and review of old charts   Medications Ordered in ED Medications  sodium chloride 0.9 % bolus 500 mL (500 mLs Intravenous New Bag/Given 06/23/2021 2242)    Followed by  0.9 %  sodium chloride infusion (has no administration in time range)  lactated ringers bolus 1,500 mL (has no administration in time range)  insulin regular, human (MYXREDLIN) 100 units/ 100 mL infusion (has no administration in time range)  lactated ringers infusion (has no administration in time range)  dextrose 5 % in lactated ringers infusion (has no administration in time range)  dextrose 50 % solution 0-50 mL (has no administration in time range)    ED Course  I have reviewed the triage vital signs and the nursing notes.  Pertinent labs & imaging results that were available during my care of the patient were reviewed by me and considered in my medical decision making (see chart for details).  Clinical Course as of 06/01/2021 2257  Sat Jun 12, 2021  2058 Attempted to call spouse.  No answer [JK]  2240 Venous blood gas without signs of acidosis. [JK]  1062 Metabolic panel notable for hyponatremia as well as hyperglycemia. [IR]  4854 Patient does have an elevated anion gap [JK]  2241 Head CT without acute findings [JK]  2241 Chest x-ray without acute abnormalities [JK]  2241 Knee x-ray without acute findings [JK]  2244 BUN and creatinine significantly elevated [JK]    Clinical Course User Index [JK] Dorie Rank, MD   MDM Rules/Calculators/A&P                           Patient presents to the ED for evaluation of altered mental status.  Head CT without signs of hemorrhage or stroke.  No signs of cerebral edema.  Vitals have remained stable and the patient is afebrile the patient is positive for COVID.  Laboratory tests  are notable for multiple electrolyte abnormalities.  Patient has severe hyperglycemia with evidence of metabolic acidosis.  His pH is normal but there could be a component of DKA versus hyperglycemic hyperosmolar syndrome.  Patient also has hypernatremia and uremia with acute kidney injury that could be contributing to his altered mental status.  We will treat with IV fluids and an insulin infusion.  We will check beta hydroxybutyric acid level.  Plan on admission to the hospital for further treatment  Final Clinical Impression(s) / ED Diagnoses Final diagnoses:  Diabetic ketoacidosis with coma associated with other specified diabetes mellitus ([place])  Hypernatremia  AKI (acute kidney injury) ([place])  COVID-19 virus infection     Dorie Rank, MD 06/20/2021 2257

## 2021-06-12 NOTE — ED Triage Notes (Addendum)
Pt bib gems from a [place] d/t altered mental status. Per ems, pt has been having altered mental status for the past 3 days. Pt is disoriented * 4.   532ml NS given by ems.   Vitals with ems:  Cbg- high  BP 115/59 99% 2L nasal cannula

## 2021-06-13 ENCOUNTER — Other Ambulatory Visit: Payer: Self-pay

## 2021-06-13 ENCOUNTER — Observation Stay (HOSPITAL_COMMUNITY): Payer: Medicare HMO

## 2021-06-13 DIAGNOSIS — E785 Hyperlipidemia, unspecified: Secondary | ICD-10-CM | POA: Diagnosis present

## 2021-06-13 DIAGNOSIS — Z66 Do not resuscitate: Secondary | ICD-10-CM

## 2021-06-13 DIAGNOSIS — Z515 Encounter for palliative care: Secondary | ICD-10-CM

## 2021-06-13 DIAGNOSIS — Z7901 Long term (current) use of anticoagulants: Secondary | ICD-10-CM | POA: Diagnosis not present

## 2021-06-13 DIAGNOSIS — I1310 Hypertensive heart and chronic kidney disease without heart failure, with stage 1 through stage 4 chronic kidney disease, or unspecified chronic kidney disease: Secondary | ICD-10-CM | POA: Diagnosis present

## 2021-06-13 DIAGNOSIS — G9340 Encephalopathy, unspecified: Secondary | ICD-10-CM | POA: Diagnosis present

## 2021-06-13 DIAGNOSIS — L89610 Pressure ulcer of right heel, unstageable: Secondary | ICD-10-CM | POA: Diagnosis present

## 2021-06-13 DIAGNOSIS — G9349 Other encephalopathy: Secondary | ICD-10-CM | POA: Diagnosis present

## 2021-06-13 DIAGNOSIS — E1122 Type 2 diabetes mellitus with diabetic chronic kidney disease: Secondary | ICD-10-CM | POA: Diagnosis not present

## 2021-06-13 DIAGNOSIS — I480 Paroxysmal atrial fibrillation: Secondary | ICD-10-CM | POA: Diagnosis present

## 2021-06-13 DIAGNOSIS — E860 Dehydration: Secondary | ICD-10-CM | POA: Diagnosis present

## 2021-06-13 DIAGNOSIS — F0280 Dementia in other diseases classified elsewhere without behavioral disturbance: Secondary | ICD-10-CM | POA: Diagnosis present

## 2021-06-13 DIAGNOSIS — E1165 Type 2 diabetes mellitus with hyperglycemia: Secondary | ICD-10-CM

## 2021-06-13 DIAGNOSIS — E870 Hyperosmolality and hypernatremia: Secondary | ICD-10-CM | POA: Diagnosis present

## 2021-06-13 DIAGNOSIS — N1832 Chronic kidney disease, stage 3b: Secondary | ICD-10-CM | POA: Diagnosis present

## 2021-06-13 DIAGNOSIS — U071 COVID-19: Secondary | ICD-10-CM | POA: Diagnosis present

## 2021-06-13 DIAGNOSIS — I350 Nonrheumatic aortic (valve) stenosis: Secondary | ICD-10-CM | POA: Diagnosis present

## 2021-06-13 DIAGNOSIS — L89150 Pressure ulcer of sacral region, unstageable: Secondary | ICD-10-CM | POA: Diagnosis present

## 2021-06-13 DIAGNOSIS — Z7189 Other specified counseling: Secondary | ICD-10-CM | POA: Diagnosis not present

## 2021-06-13 DIAGNOSIS — N1831 Chronic kidney disease, stage 3a: Secondary | ICD-10-CM | POA: Diagnosis not present

## 2021-06-13 DIAGNOSIS — E1111 Type 2 diabetes mellitus with ketoacidosis with coma: Secondary | ICD-10-CM | POA: Diagnosis present

## 2021-06-13 DIAGNOSIS — E1151 Type 2 diabetes mellitus with diabetic peripheral angiopathy without gangrene: Secondary | ICD-10-CM | POA: Diagnosis present

## 2021-06-13 DIAGNOSIS — N183 Chronic kidney disease, stage 3 unspecified: Secondary | ICD-10-CM | POA: Diagnosis not present

## 2021-06-13 DIAGNOSIS — N179 Acute kidney failure, unspecified: Secondary | ICD-10-CM | POA: Diagnosis present

## 2021-06-13 DIAGNOSIS — I10 Essential (primary) hypertension: Secondary | ICD-10-CM | POA: Diagnosis not present

## 2021-06-13 DIAGNOSIS — G20 Parkinson's disease: Secondary | ICD-10-CM | POA: Diagnosis present

## 2021-06-13 DIAGNOSIS — R64 Cachexia: Secondary | ICD-10-CM | POA: Diagnosis present

## 2021-06-13 DIAGNOSIS — E1100 Type 2 diabetes mellitus with hyperosmolarity without nonketotic hyperglycemic-hyperosmolar coma (NKHHC): Secondary | ICD-10-CM

## 2021-06-13 DIAGNOSIS — I442 Atrioventricular block, complete: Secondary | ICD-10-CM | POA: Diagnosis present

## 2021-06-13 DIAGNOSIS — F419 Anxiety disorder, unspecified: Secondary | ICD-10-CM | POA: Diagnosis present

## 2021-06-13 DIAGNOSIS — Z803 Family history of malignant neoplasm of breast: Secondary | ICD-10-CM | POA: Diagnosis not present

## 2021-06-13 DIAGNOSIS — E1311 Other specified diabetes mellitus with ketoacidosis with coma: Secondary | ICD-10-CM | POA: Diagnosis not present

## 2021-06-13 LAB — BASIC METABOLIC PANEL
Anion gap: 16 — ABNORMAL HIGH (ref 5–15)
Anion gap: 12 (ref 5–15)
BUN: 136 mg/dL — ABNORMAL HIGH (ref 8–23)
BUN: 134 mg/dL — ABNORMAL HIGH (ref 8–23)
CO2: 19 mmol/L — ABNORMAL LOW (ref 22–32)
CO2: 22 mmol/L (ref 22–32)
Calcium: 9.3 mg/dL (ref 8.9–10.3)
Calcium: 9.2 mg/dL (ref 8.9–10.3)
Chloride: 118 mmol/L — ABNORMAL HIGH (ref 98–111)
Chloride: 123 mmol/L — ABNORMAL HIGH (ref 98–111)
Creatinine, Ser: 3.5 mg/dL — ABNORMAL HIGH (ref 0.61–1.24)
Creatinine, Ser: 3.49 mg/dL — ABNORMAL HIGH (ref 0.61–1.24)
GFR, Estimated: 17 mL/min — ABNORMAL LOW (ref >60)
GFR, Estimated: 17 mL/min — ABNORMAL LOW (ref >60)
Glucose, Bld: 558 mg/dL (ref 70–99)
Glucose, Bld: 148 mg/dL — ABNORMAL HIGH (ref 70–99)
Potassium: 3.9 mmol/L (ref 3.5–5.1)
Potassium: 3.8 mmol/L (ref 3.5–5.1)
Sodium: 153 mmol/L — ABNORMAL HIGH (ref 135–145)
Sodium: 157 mmol/L — ABNORMAL HIGH (ref 135–145)

## 2021-06-13 LAB — CBG MONITORING, ED
Glucose-Capillary: 111 mg/dL — ABNORMAL HIGH (ref 70–99)
Glucose-Capillary: 127 mg/dL — ABNORMAL HIGH (ref 70–99)
Glucose-Capillary: 142 mg/dL — ABNORMAL HIGH (ref 70–99)
Glucose-Capillary: 185 mg/dL — ABNORMAL HIGH (ref 70–99)
Glucose-Capillary: 202 mg/dL — ABNORMAL HIGH (ref 70–99)
Glucose-Capillary: 227 mg/dL — ABNORMAL HIGH (ref 70–99)
Glucose-Capillary: 300 mg/dL — ABNORMAL HIGH (ref 70–99)
Glucose-Capillary: 348 mg/dL — ABNORMAL HIGH (ref 70–99)
Glucose-Capillary: 460 mg/dL — ABNORMAL HIGH (ref 70–99)
Glucose-Capillary: 479 mg/dL — ABNORMAL HIGH (ref 70–99)
Glucose-Capillary: 569 mg/dL (ref 70–99)
Glucose-Capillary: 93 mg/dL (ref 70–99)

## 2021-06-13 LAB — BETA-HYDROXYBUTYRIC ACID: Beta-Hydroxybutyric Acid: 0.15 mmol/L (ref 0.05–0.27)

## 2021-06-13 LAB — MAGNESIUM: Magnesium: 2.5 mg/dL — ABNORMAL HIGH (ref 1.7–2.4)

## 2021-06-13 MED ORDER — FREE WATER: 200.0000 mL | Freq: Three times a day (TID) | Status: DC

## 2021-06-13 MED ORDER — INSULIN GLARGINE-YFGN 100 UNIT/ML ~~LOC~~ SOLN
15.0000 [IU] | Freq: Every day | SUBCUTANEOUS | Status: DC
  Administered 2021-06-13 – 2021-06-15 (×3): 15 [IU] via SUBCUTANEOUS
  Filled 2021-06-13 (×3): qty 0.15

## 2021-06-13 MED ORDER — SODIUM CHLORIDE 0.45 % IV SOLN: INTRAVENOUS | Status: DC

## 2021-06-13 MED ORDER — INSULIN ASPART 100 UNIT/ML IJ SOLN
0.0000 [IU] | Freq: Three times a day (TID) | INTRAMUSCULAR | Status: DC
  Administered 2021-06-13 – 2021-06-14 (×2): 3 [IU] via SUBCUTANEOUS
  Administered 2021-06-14: 5 [IU] via SUBCUTANEOUS
  Administered 2021-06-15 (×2): 2 [IU] via SUBCUTANEOUS

## 2021-06-13 MED ORDER — SODIUM CHLORIDE 0.9 % IV SOLN
200.0000 mg | Freq: Once | INTRAVENOUS | Status: AC
  Administered 2021-06-13: 200 mg via INTRAVENOUS
  Filled 2021-06-13: qty 40

## 2021-06-13 MED ORDER — INSULIN ASPART 100 UNIT/ML IJ SOLN: 0.0000 [IU] | Freq: Every day | INTRAMUSCULAR | Status: DC

## 2021-06-13 MED ORDER — SODIUM CHLORIDE 0.9 % IV SOLN
100.0000 mg | Freq: Every day | INTRAVENOUS | Status: AC
  Administered 2021-06-14 – 2021-06-15 (×2): 100 mg via INTRAVENOUS
  Filled 2021-06-13 (×2): qty 20

## 2021-06-13 MED ORDER — SODIUM BICARBONATE 8.4 % IV SOLN
INTRAVENOUS | Status: DC
  Filled 2021-06-13: qty 1000

## 2021-06-13 NOTE — Progress Notes (Signed)
SLP Cancellation Note  Patient Details Name: Dylan [name]. MRN: [id number] DOB: May 03, 1941   Cancelled treatment:       Reason Eval/Treat Not Completed: Patient's level of consciousness. RN reported to SLP that has been minimally alert and that patient would not be able to participate in BSE at this time. SLP will follow up for BSE readiness next date  Sonia Baller, MA, [facility]

## 2021-06-13 NOTE — ED Notes (Deleted)
Lunch tray ordered 

## 2021-06-13 NOTE — ED Notes (Addendum)
Foley cath. Not inserted by this tech. Accidental click off of order.

## 2021-06-13 NOTE — ED Notes (Signed)
Pt underwent bladder scan. Scanner showed between 200-15mL of urine in bladder at the time of assessment.

## 2021-06-13 NOTE — ED Notes (Signed)
Pt getting akidney ultra sound at  The bedside

## 2021-06-13 NOTE — ED Notes (Addendum)
Paging hospitalist night coverage regarding telemetry events with increasing PVCs to include bigeminy, pairs of PVCs and LBBB (is not new). Patient has a [employer] pacemaker, no pacer spikes noted on telemetry review. Will attempt to interrogate if we are able to overnight.   Spoke with Dr. [place] Crane and repeating BMP now.

## 2021-06-13 NOTE — Consult Note (Signed)
Palliative Medicine Inpatient Consult Note  Consulting Provider: Shelly Coss, MD  Reason for consult:   [place] [facility] Consult  Reason for Consult? Extremely deconditioned elderly male presented with altered mental status, hypoglycemic, and DKA.  Also has COVID.DNR.  Candidate for hospice.   HPI:  Per intake H&P --> Patient is a 80 year old male with history of CKD stage IIIa, paroxysmal A. fib,, complete AV block status post pacemaker, dementia, Parkinson's disease, peripheral artery disease, hypertension, hyperlipidemia, diabetes who presented with altered mental status from [facility] facility.  He had 3 days of declining mentation and EMS was called.  He is total care at baseline due to significant dementia, does not communicate with anyone except for his wife.  He had very poor oral intake since last few days.  On presentation, lab work showed sodium of 152, potassium of 5.2.  Elevated anion gap of 17, creatinine of 4, severe hyperglycemia.  COVID screen test was positive.  Chest x-ray did not show any pneumonia.  CT did not show any acute intracranial abnormalities.  Patient was admitted for further management.  Started on insulin drip for severe hyperglycemia with elevated anion gap, IV fluids for severe AKI.  Palliative care has been asked to aid in further goals of care conversations in the setting of multiple co-morbidities and overall declining health state.  Dylan [name] has been followed as an outpatient by [facility] since August of last year.  Clinical Assessment/Goals of Care:  *Please note that this is a verbal dictation therefore any spelling or grammatical errors are due to the "[place]" system interpretation.  I have reviewed medical records including EPIC notes, labs and imaging, received report from bedside RN, assessed the patient who is not responding to me at bedside, he appears lethargic.    I  called patients spouse, Dylan [name] to further discuss diagnosis prognosis, GOC, EOL wishes, disposition and options.  A brief review of Dylan [name] past medical history was completed inclusive of his significant history of dementia, PD, DM II, PAD, P.Afib, and CKD. We reviewed that he has many chronic diseases and had been declining in his overall health state since he transitioned to [place] in April of this year. Dylan [name] shares that he has "mistreated and is not being cared for properly."    I introduced Palliative Medicine as specialized medical care for people living with serious illness. It focuses on providing relief from the symptoms and stress of a serious illness. The goal is to improve quality of life for both the patient and the family.  Dylan [name] presently lives at [facility] skilled nursing facility. Prior to this he had been living with his wife Dylan [name]. Dylan [name] and he have been married since 2011. Dylan [name] moved to [place] to be closer to her daughter and Dylan [name] followed. Dylan [name] also has a daughter though they are estranged. Dylan [name] formerly worked for the [employer] in [place]. He was a robust character in his younger days. He is a man of faith and practices within the [facility] denomination.  Dylan [name] had been doing poorly at [facility] per [place] he was mostly in the bed and eating very little. She expresses feelings that staff had neglected him. I provided education on the chronic nature of dementia and patients gradual decline overtime to the point where they not long have a want, need, or desire to eat and drink. We reviewed that he has a deficit of nutritional intake per review of laboratory  values. I shared that this is not uncommon as dementia progression worsens to what we often see as the terminal phases.   We reviewed that presently, Dylan [name] is also afflicted by COVID which can cause even worse symptom burden, frailty, fatigue, and failure to thrive.   Dylan [name]  expresses to me ongoing anger regarding the care Dylan [name] received at [facility]. I allowed a safe space for her to express these frustrations. I offers support through empathetic listening.   A detailed discussion was had today regarding advanced directives - patients spouse, Dylan [name] is his primary [place] representative.    Concepts specific to code status, artifical feeding and hydration, continued IV antibiotics and rehospitalization was had.  A review of Dylan [name] prior MOST for was completed as below:  Cardiopulmonary Resuscitation: Do Not Attempt Resuscitation (DNR/No CPR)  Medical Interventions: Limited Additional Interventions: Use medical treatment, IV fluids and cardiac monitoring as indicated, DO NOT USE intubation or mechanical ventilation. May consider use of less invasive airway support such as BiPAP or CPAP. Also provide comfort measures. Transfer to the hospital if indicated. Avoid intensive care.   Antibiotics: Antibiotics if indicated  IV Fluids: IV fluids if indicated  Feeding Tube: No feeding tube   Dylan [name] shares that she would likely use a feeding tube for Dylan [name], I educated her on the contraindications of their use in our dementia patients. We reviewed that they do provide enhanced quality of life and can rather cause more detriment and harm. She understood this rationale.   The difference between a aggressive medical intervention path  and a palliative comfort care path for this patient at this time was had. Dylan [name] remains hopeful for improvements despite my sharing that I worry we are encroaching upon his final phases of life. We reviewed tp continue treatments for a trial period of 72 hours to see if improvements can be make. If patient is worsening or neglect to improve we discussed having another conversation about next steps. I did then broach the topics of comfort care and hospice care.  Discussed the importance of continued conversation with family and their  medical providers  regarding overall plan of care and treatment options, ensuring decisions are within the context of the patients values and GOCs.  Decision Maker: Dylan [name] (spouse) [phone]  SUMMARY OF RECOMMENDATIONS   DNAR/DNI  DNR/MOST in [place]  Advance Directive in [place] period of 72 hours to see if patient improves, if not will proceed with further conversation regarding goals.  The topics of comfort care and hospice have been introduced  Will alert [facility] of patients admission as he is on their OP Palliative service  Ongoing PMT support in the oncoming days  Code Status/Advance Care Planning: DNAR/DNI   Palliative Prophylaxis:  Oral Care, Mobility, Pain, Delirium  Additional Recommendations (Limitations, Scope, Preferences): Continue current scope of care   Psycho-social/Spiritual:  Desire for further Chaplaincy support: No Additional Recommendations:    Prognosis: Poor in the setting of terminal frailty - appropriate for hospice meets < 6 months.   Discharge Planning: Unclear  Vitals:   06/13/21 1315 06/13/21 1400  BP: (!) 128/58 (!) 120/58  Pulse: 84 87  Resp: (!) 28 (!) 32  Temp:    SpO2: 98% 99%    Intake/Output Summary (Last 24 hours) at 06/13/2021 1517 Last data filed at 06/13/2021 1002 Gross per 24 hour  Intake 137.31 ml  Output --  Net 137.31 ml   Gen:  Elderly chronically ill appearing AA M HEENT: Dry  mucous membranes CV: Regular rate and rhythm, no murmurs rubs or gallops PULM: clear to auscultation bilaterally  ABD: concave, active BS EXT: Edema noted in BUE Neuro: Lethargic  PPS: 10%   This conversation/these recommendations were discussed with patient primary care team, Dr. Tawanna Solo  Time In: 1430 Time Out: 1600 Total Time: 90 Greater than 50%  of this time was spent counseling and coordinating care related to the above assessment and plan.  [place] Team Team Cell Phone:  [phone] Please utilize secure chat with additional questions, if there is no response within 30 minutes please call the above phone number  Palliative Medicine Team providers are available by phone from 7am to 7pm daily and can be reached through the team cell phone.  Should this patient require assistance outside of these hours, please call the patient's attending physician.

## 2021-06-13 NOTE — Progress Notes (Signed)
Inpatient Diabetes Program Recommendations  AACE/ADA: New Consensus Statement on Inpatient Glycemic Control (2015)  Target Ranges:  Prepandial:   less than 140 mg/dL      Peak postprandial:   less than 180 mg/dL (1-2 hours)      Critically ill patients:  140 - 180 mg/dL   Lab Results  Component Value Date   GLUCAP 202 (H) 06/13/2021   HGBA1C 7.1 (H) 05/12/2021    Review of Glycemic Control  Diabetes history: DM2 Outpatient Diabetes medications: metfomin 5072 mg QAM, Trulicity 1.5 mg Q weekly on Mon Current orders for Inpatient glycemic control: Semglee 15 units QD, Novolog 0-9 units TID with meals and 0-5 HS  HgbA1C - 7.1% Covid 19+ NPO at present  Inpatient Diabetes Program Recommendations:    Change Novolog to 0-9 units Q4H while NPO.  Will follow.  Thank you. Lorenda Peck, RD, LDN, CDE Inpatient Diabetes Coordinator [phone]

## 2021-06-13 NOTE — Progress Notes (Signed)
PROGRESS NOTE    [name]  EXH:[id number] DOB: 08-15-1941 DOA: 06/02/2021 PCP: [name], MD   Chief Complain:AMS  Brief Narrative: Patient is a 80 year old male with history of CKD stage IIIa, paroxysmal A. fib,, complete AV block status post pacemaker, dementia, Parkinson's disease, peripheral artery disease, hypertension, hyperlipidemia, diabetes who presented with altered mental status from [facility] facility.  He had 3 days of declining mentation and EMS was called.  He is total care at baseline due to significant dementia, does not communicate with anyone except for his wife.  He had very poor oral intake since last few days.  On presentation, lab work showed sodium of 152, potassium of 5.2.  Elevated anion gap of 17, creatinine of 4, severe hyperglycemia.  COVID screen test was positive.  Chest x-ray did not show any pneumonia.  CT did not show any acute intracranial abnormalities.  Patient was admitted for further management.  Started on insulin drip for severe hyperglycemia with elevated anion gap, IV fluids for severe AKI.  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  Not alert and oriented at baseline.  Declining mental status possibly secondary to COVID, AKI, hyperglycemia.  Monitor mental status.  CT head did not show any acute intracranial normalities.  DKA/hyperglycemia: Presented with blood sugars in the range  of 700s, elevated anion gap.  Continue IV fluids,started on insulin drip.  Gap is closed, blood sugars have improved.  We have stopped IV insulin, started on Lantus and sliding scale.  Monitor blood sugars.  Diabetic coordinator consulted.HbA1C was 7.1 as per 05/12/2021  Hypernatremia: Most likely secondary dehydration.  Continue hypotonic fluid  AKI on CKD stage IIIa: Baseline creatinine around 1.4.  Presented with creatinine in the range of 4.  Most likely secondary to dehydration from poor oral intake.  Continue IV fluids.  Renal function improving with IV fluids.  Renal ultrasound did not show any obstruction  COVID positivity: Fully vaccinated patient with incidental finding, no respiratory illness.  Chest x-ray did not show pneumonia.  Started on remdesivir due to high risk category.  History of paroxysmal A. fib: Takes Eliquis for anticoagulation.  Currently on hold due to AKI.  Patient is status post pacemaker placement for complete heart block.  Currently rate is controlled.  Also on carvedilol at baseline which is on hold.  He is currently in sinus rhythm  Hyperlipidemia: On pravastatin  History of peripheral artery disease/hypertension: On carvedilol, currently on hold  History of Parkinson's dementia: Total dependent on care.  Lives in a skilled nursing facility.  On Sinemet.  Also on rivastigmine, Seroquel, Namenda which are on hold.  Monitor mental status.  Delirium precautions.  Goals of care: Extremely deconditioned male, dementia at baseline, minimally verbal, with poor quality of life.  Palliative care consult for goals of care.  He can be a candidate for hospice.          DVT prophylaxis:SCD Code Status: DNR Family Communication:  Called and discussed with wife on phone Status is: Observation  Inpatient  Dispo: The patient is from: SNF              Anticipated d/c is to: SNF              Patient currently is not medically stable to d/c.   Difficult to place patient  No      Consultants: None  Procedures:None  Antimicrobials:  Anti-infectives (From admission, onward)    Start     Dose/Rate Route Frequency Ordered Stop   06/14/21 1000  remdesivir 100 mg in sodium chloride 0.9 % 100 mL IVPB       See Hyperspace for full Linked Orders Report.   100 mg 200 mL/hr over 30 Minutes Intravenous Daily 06/13/21 0047 06/16/21 0959   06/13/21 0100  remdesivir 200 mg in sodium chloride 0.9% 250 mL IVPB       See Hyperspace for full Linked Orders Report.   200 mg 580 mL/hr over 30 Minutes Intravenous Once 06/13/21 0047 06/13/21 0346       Subjective:  Patient seen and examined the bedside this morning.  He was lying on the bed, nonverbal.  Did not even open the eyes, not obeying  commands.  Hemodynamically stable.  Objective: Vitals:   06/13/21 0500 06/13/21 0545 06/13/21 0630 06/13/21 0648  BP: 117/62 116/60    Pulse: 61 62 64   Resp: (!) 29 (!) 30 (!) 28   Temp:    97.8 F (36.6 C)  TempSrc:      SpO2: 100% 100% 100%     Intake/Output Summary (Last 24 hours) at 06/13/2021 0801 Last data filed at 06/13/2021 0741 Gross per 24 hour  Intake 131.19 ml  Output --  Net 131.19 ml   There were no vitals filed for this visit.  Examination:  General exam: Not in distress, very deconditioned, chronically looking HEENT: PERRL Respiratory system:  no wheezes or crackles  Cardiovascular system: S1 & S2 heard, RRR.  Gastrointestinal system: Abdomen is nondistended, soft and nontender. Central nervous system: Not alert and oriented Extremities: No edema, no clubbing ,no cyanosis Skin: No rashes, no ulcers,no icterus       Data Reviewed: I have personally reviewed following labs and imaging studies  CBC: Recent Labs  Lab 06/11/2021 2039 06/10/2021 2116  WBC 7.7  --   NEUTROABS 5.7  --   HGB 12.8* 12.6*  HCT 42.8 37.0*  MCV 96.4  --   PLT 249  --    Basic Metabolic Panel: Recent Labs  Lab 06/30/2021 2039 06/24/2021 2116 06/30/2021 2300  06/13/21 0216  NA 152* 150* 152* 153*  K 5.2* 6.1* 5.4* 3.9  CL 116*  --  116* 118*  CO2 19*  --  19* 19*  GLUCOSE 764*  --  790* 558*  BUN 140*  --  145* 136*  CREATININE 4.04*  --  4.02* 3.50*  CALCIUM 9.5  --  9.4 9.3  MG  --   --   --  2.5*   GFR: CrCl cannot be calculated (Unknown ideal weight.). Liver Function Tests: Recent Labs  Lab 06/19/2021 2039  AST 24  ALT 7  ALKPHOS 85  BILITOT 1.1  PROT 6.7  ALBUMIN 2.5*   No results for input(s): LIPASE, AMYLASE in the last 168 hours. Recent Labs  Lab 06/09/2021 2108  AMMONIA 12   Coagulation Profile: Recent Labs  Lab 06/14/2021 2039  INR 2.2*   Cardiac Enzymes:  No results for input(s): CKTOTAL, CKMB, CKMBINDEX, TROPONINI in the last 168 hours. BNP (last 3 results) No results for input(s): PROBNP in the last 8760 hours. HbA1C: No results for input(s): HGBA1C in the last 72 hours. CBG: Recent Labs  Lab 06/13/21 0214 06/13/21 0341 06/13/21 0454 06/13/21 0617 06/13/21 0735  GLUCAP 479* 348* 300* 227* 142*   Lipid Profile: No results for input(s): CHOL, HDL, LDLCALC, TRIG, CHOLHDL, LDLDIRECT in the last 72 hours. Thyroid Function Tests: No results for input(s): TSH, T4TOTAL, FREET4, T3FREE, THYROIDAB in the last 72 hours. Anemia Panel: No results for input(s): VITAMINB12, FOLATE, FERRITIN, TIBC, IRON, RETICCTPCT in the last 72 hours. Sepsis Labs: No results for input(s): PROCALCITON, LATICACIDVEN in the last 168 hours.  Recent Results (from the past 240 hour(s))  Resp Panel by RT-PCR (Flu A&B, Covid) Nasopharyngeal Swab     Status: Abnormal   Collection Time: 06/20/2021  8:39 PM   Specimen: Nasopharyngeal Swab; Nasopharyngeal(NP) swabs in vial transport medium  Result Value Ref Range Status   SARS Coronavirus 2 by RT PCR POSITIVE (A) NEGATIVE Final    Comment: RESULT CALLED TO, READ BACK BY AND VERIFIED WITH: RN G TATE AT 2251 06/21/2021 BY L BENFIELD (NOTE) SARS-CoV-2 target nucleic acids are DETECTED.  The  SARS-CoV-2 RNA is generally detectable in upper respiratory specimens during the acute phase of infection. Positive results are indicative of the presence of the identified virus, but do not rule out bacterial infection or co-infection with other pathogens not detected by the test. Clinical correlation with patient history and other diagnostic information is necessary to determine patient infection status. The expected result is Negative.  Fact Sheet for Patients: EntrepreneurPulse.com.au  Fact Sheet for Healthcare Providers: IncredibleEmployment.be  This test is not yet approved or cleared by the [place] FDA and  has been authorized for detection and/or diagnosis of SARS-CoV-2 by FDA under an Emergency Use Authorization (EUA).  This EUA will remain in effect (meaning this tes t can be used) for the duration of  the COVID-19 declaration under Section 564(b)(1) of the Act, 21 U.S.C. section 360bbb-3(b)(1), unless the authorization is terminated or revoked sooner.     Influenza A by PCR NEGATIVE NEGATIVE Final   Influenza B by PCR NEGATIVE NEGATIVE Final    Comment: (NOTE) The Xpert Xpress SARS-CoV-2/FLU/RSV plus assay is intended as an aid in the diagnosis of influenza from Nasopharyngeal swab specimens and should not be used as a sole basis for treatment. Nasal washings and aspirates are unacceptable for Xpert Xpress SARS-CoV-2/FLU/RSV testing.  Fact Sheet for Patients: EntrepreneurPulse.com.au  Fact Sheet for Healthcare Providers: IncredibleEmployment.be  This test is not yet approved or cleared by the [place] FDA and has been authorized for detection and/or diagnosis of SARS-CoV-2 by FDA under an Emergency Use Authorization (EUA). This EUA will remain in effect (meaning this test can be used) for the duration of the COVID-19 declaration under Section 564(b)(1) of the Act, 21 U.S.C. section  360bbb-3(b)(1), unless the authorization is terminated or revoked.  Performed at [facility], [place] [address], [place], [place] [address]          Radiology Studies: DG Chest 1 View  Result Date: 06/20/2021 CLINICAL DATA:  Altered mental status EXAM: CHEST  1 VIEW COMPARISON:  02/14/2021 FINDINGS: Lungs are clear.  No pleural effusion or pneumothorax. The heart is normal in size.  Left subclavian pacemaker. IMPRESSION: No evidence of acute cardiopulmonary disease. Electronically Signed   By: Julian Hy  M.D.   On: 06/29/2021 21:42   DG Knee 2 Views Right  Result Date: 06/30/2021 CLINICAL DATA:  Pain EXAM: RIGHT KNEE - 1-2 VIEW COMPARISON:  None. FINDINGS: No fracture or dislocation is seen. The joint spaces are preserved. The visualized soft tissues are unremarkable. No suprapatellar knee joint effusion. IMPRESSION: Negative. Electronically Signed   By: Julian Hy M.D.   On: 06/27/2021 21:42   CT HEAD WO CONTRAST  Result Date: 06/11/2021 CLINICAL DATA:  Altered mental status EXAM: CT HEAD WITHOUT CONTRAST TECHNIQUE: Contiguous axial images were obtained from the base of the skull through the vertex without intravenous contrast. COMPARISON:  02/27/2021 FINDINGS: Brain: No evidence of acute infarction, hemorrhage, hydrocephalus, extra-axial collection or mass lesion/mass effect. Global cortical and central atrophy. Subcortical white matter and periventricular small vessel ischemic changes. Vascular: Intracranial atherosclerosis. Skull: Normal. Negative for fracture or focal lesion. Sinuses/Orbits: The visualized paranasal sinuses are essentially clear. The mastoid air cells are unopacified. Other: None. IMPRESSION: No evidence of acute intracranial abnormality. Atrophy with small vessel ischemic changes. Electronically Signed   By: Julian Hy M.D.   On: 06/16/2021 21:27   US Renal  Result Date: 06/13/2021 CLINICAL DATA:  Acute renal failure EXAM: RENAL / URINARY  TRACT ULTRASOUND COMPLETE COMPARISON:  CT abdomen/pelvis dated 02/14/2021 FINDINGS: Right Kidney: Renal measurements: 10.6 x 6.1 x 4.7 cm = volume: 159 mL. Echogenicity within normal limits. No mass or hydronephrosis visualized. Left Kidney: Not discretely visualized. Bladder: Appears normal for degree of bladder distention. Other: None. IMPRESSION: Left kidney is not discretely visualized. Right kidney is unremarkable.  No hydronephrosis. Electronically Signed   By: Julian Hy M.D.   On: 06/13/2021 00:45        Scheduled Meds:  carbidopa-levodopa  1 tablet Oral BID   sodium chloride flush  3 mL Intravenous Q12H   Continuous Infusions:  sodium chloride     dextrose 5% lactated ringers 125 mL/hr at 06/13/21 0628   insulin 2.4 Units/hr (06/13/21 0741)   lactated ringers Stopped (06/13/21 0631)   [START ON 06/14/2021] remdesivir 100 mg in NS 100 mL       LOS: 0 days    Time spent:35 mins. More than 50% of that time was spent in counseling and/or coordination of care.      Shelly Coss, MD [facility] P8/14/2022, 8:01 AM

## 2021-06-13 NOTE — ED Notes (Signed)
Pt did not pass swallow screen he is unable to follow directions

## 2021-06-14 ENCOUNTER — Inpatient Hospital Stay (HOSPITAL_COMMUNITY): Payer: Medicare HMO

## 2021-06-14 DIAGNOSIS — N179 Acute kidney failure, unspecified: Secondary | ICD-10-CM | POA: Diagnosis not present

## 2021-06-14 DIAGNOSIS — E1311 Other specified diabetes mellitus with ketoacidosis with coma: Secondary | ICD-10-CM | POA: Diagnosis not present

## 2021-06-14 DIAGNOSIS — U071 COVID-19: Secondary | ICD-10-CM | POA: Diagnosis not present

## 2021-06-14 DIAGNOSIS — R5383 Other fatigue: Secondary | ICD-10-CM

## 2021-06-14 DIAGNOSIS — G9340 Encephalopathy, unspecified: Secondary | ICD-10-CM | POA: Diagnosis not present

## 2021-06-14 LAB — BASIC METABOLIC PANEL
Anion gap: 11 (ref 5–15)
Anion gap: 11 (ref 5–15)
Anion gap: 9 (ref 5–15)
BUN: 136 mg/dL — ABNORMAL HIGH (ref 8–23)
BUN: 135 mg/dL — ABNORMAL HIGH (ref 8–23)
BUN: 131 mg/dL — ABNORMAL HIGH (ref 8–23)
CO2: 23 mmol/L (ref 22–32)
CO2: 20 mmol/L — ABNORMAL LOW (ref 22–32)
CO2: 22 mmol/L (ref 22–32)
Calcium: 9 mg/dL (ref 8.9–10.3)
Calcium: 8.7 mg/dL — ABNORMAL LOW (ref 8.9–10.3)
Calcium: 8.9 mg/dL (ref 8.9–10.3)
Chloride: 121 mmol/L — ABNORMAL HIGH (ref 98–111)
Chloride: 123 mmol/L — ABNORMAL HIGH (ref 98–111)
Chloride: 123 mmol/L — ABNORMAL HIGH (ref 98–111)
Creatinine, Ser: 3.2 mg/dL — ABNORMAL HIGH (ref 0.61–1.24)
Creatinine, Ser: 3.05 mg/dL — ABNORMAL HIGH (ref 0.61–1.24)
Creatinine, Ser: 3.03 mg/dL — ABNORMAL HIGH (ref 0.61–1.24)
GFR, Estimated: 19 mL/min — ABNORMAL LOW (ref >60)
GFR, Estimated: 20 mL/min — ABNORMAL LOW (ref >60)
GFR, Estimated: 20 mL/min — ABNORMAL LOW (ref >60)
Glucose, Bld: 207 mg/dL — ABNORMAL HIGH (ref 70–99)
Glucose, Bld: 282 mg/dL — ABNORMAL HIGH (ref 70–99)
Glucose, Bld: 160 mg/dL — ABNORMAL HIGH (ref 70–99)
Potassium: 4.1 mmol/L (ref 3.5–5.1)
Potassium: 4 mmol/L (ref 3.5–5.1)
Potassium: 4 mmol/L (ref 3.5–5.1)
Sodium: 155 mmol/L — ABNORMAL HIGH (ref 135–145)
Sodium: 154 mmol/L — ABNORMAL HIGH (ref 135–145)
Sodium: 154 mmol/L — ABNORMAL HIGH (ref 135–145)

## 2021-06-14 LAB — GLUCOSE, CAPILLARY
Glucose-Capillary: 132 mg/dL — ABNORMAL HIGH (ref 70–99)
Glucose-Capillary: 132 mg/dL — ABNORMAL HIGH (ref 70–99)
Glucose-Capillary: 142 mg/dL — ABNORMAL HIGH (ref 70–99)

## 2021-06-14 LAB — URINALYSIS, ROUTINE W REFLEX MICROSCOPIC
Bilirubin Urine: NEGATIVE
Glucose, UA: 50 mg/dL — AB
Hgb urine dipstick: NEGATIVE
Ketones, ur: NEGATIVE mg/dL
Leukocytes,Ua: NEGATIVE
Nitrite: NEGATIVE
Protein, ur: NEGATIVE mg/dL
Specific Gravity, Urine: 1.017 (ref 1.005–1.030)
pH: 5 (ref 5.0–8.0)

## 2021-06-14 LAB — CBG MONITORING, ED
Glucose-Capillary: 497 mg/dL — ABNORMAL HIGH (ref 70–99)
Glucose-Capillary: 264 mg/dL — ABNORMAL HIGH (ref 70–99)
Glucose-Capillary: 209 mg/dL — ABNORMAL HIGH (ref 70–99)

## 2021-06-14 LAB — BETA-HYDROXYBUTYRIC ACID: Beta-Hydroxybutyric Acid: 0.57 mmol/L — ABNORMAL HIGH (ref 0.05–0.27)

## 2021-06-14 LAB — RAPID URINE DRUG SCREEN, HOSP PERFORMED
Amphetamines: NOT DETECTED
Barbiturates: NOT DETECTED
Benzodiazepines: NOT DETECTED
Cocaine: NOT DETECTED
Opiates: NOT DETECTED
Tetrahydrocannabinol: NOT DETECTED

## 2021-06-14 MED ORDER — CHLORHEXIDINE GLUCONATE CLOTH 2 % EX PADS
6.0000 | MEDICATED_PAD | Freq: Every day | CUTANEOUS | Status: DC
  Administered 2021-06-14 – 2021-06-17 (×4): 6 via TOPICAL

## 2021-06-14 NOTE — Progress Notes (Signed)
Daily Progress Note   Patient Name: Dylan [name].       Date: 06/14/2021 DOB: 1941/05/30  Age: 80 y.o. MRN#: [id number] Attending Physician: Shelly Coss, MD Primary Care Physician: [name], MD Admit Date: 06/11/2021  Reason for Consultation/Follow-up: Establishing goals of care  Subjective: Chart Reviewed. Updates Received. Patient Assessed.   Patient is lethargic. Intermittently opens eyes during exam. No family present. Appetite remains poor. SLP is recommending NPO.  COVID + with no active symptoms but given severity of deconditioning is at high risk for further decline.   I spoke at length with patient's wife, Dylan [name]. Updates provided. I created space and opportunity to allow her discuss her thoughts and feelings. Dylan [name] shares patient's condition and perceived poor quality of life prior to admission. She is appreciative of updates by myself and attending.   I had an open and honest discussion with Dylan [name] regarding Mr. Roselle poor prognosis, minimal to no chance of a meaningful recovery, concerns for severe malnutrition and inability to improve oral nutrition. We discussed artificial feedings with recommendations not to proceed given this would not add any additional value to his quality of life, in the setting of dementia,  and upon extensive review of previously completed MOST form and filed advanced directives patient was clear of wishes for no artificial feedings or hydration. Wife tearful verbalizing her understanding and appreciation. She shares she has not had to make such difficult decisions for him thus far and appreciative of support and guidance. She expressed wishes for no artificial feeding/PEG.   Recommendations provided to consider a more comfort path of care with hospice support. Education provided on hospice home vs returning to [facility] with hospice support. Wife verbalized understanding.   Wife confirms DNR/DNI. She understands patient is at high  risk of sudden death or further decline. Request to continue with current plan of care until further discussions on tomorrow.   I encouraged wife to continue ongoing discussions with family for support. She verbalized understanding. She would like some time to think about our discussions today and speak with her son. She is requesting follow-up meeting tomorrow to continue discussions and possibly make further decisions. Acknowledged request.   Wife and I plan to meet tomorrow, 06/15/2021 @ 12pm as she requested.   All questions answered and support provided.    Length of Stay: 1 day  Vital Signs: BP 136/66   Pulse 92   Temp 98.7 F (37.1 C) (Oral)   Resp (!) 40   SpO2 97%  SpO2: SpO2: 97 % O2 Device: O2 Device: Room Air O2 Flow Rate:    Physical Exam: Lethargic, cachectic, chronically-ill appearing RRR Diminished bilaterally No edema, muscle wasting Lethargic, unable to follow commands, nonverbal at baseline             Palliative Care Assessment & Plan  HPI: Per intake H&P --> Patient is a 80 year old male with history of CKD stage IIIa, paroxysmal A. fib,, complete AV block status post pacemaker, dementia, Parkinson's disease, peripheral artery disease, hypertension, hyperlipidemia, diabetes who presented with altered mental status from [place] facility.  He had 3 days of declining mentation and EMS was called.  He is total care at baseline due to significant dementia, does not communicate with anyone except for his wife.  He had very poor oral intake since last few days.  On presentation, lab work showed sodium of 152, potassium of 5.2.  Elevated anion gap of 17, creatinine of 4, severe hyperglycemia.  COVID screen test was positive.  Chest x-ray did not show any pneumonia.  CT did not show any acute intracranial abnormalities.  Patient was admitted for further management.  Started on insulin drip for severe hyperglycemia with elevated anion gap, IV fluids for severe  AKI.  Code Status: DNR  Goals of Care/Recommendations: Continue with current plan of care Extensive discussion with wife regarding poor prognosis. She was tearful however verbalized understanding. Recommendations for comfort focused care and hospice. Wife leaning towards recommendations. Has requested follow-up meeting tomorrow 8/16 @ 12pm. She is aware I will meet her at patient's room.  PMT will continue to support and follow.   Prognosis: POOR  Discharge Planning: To Be Determined  Thank you for allowing the Palliative Medicine Team to assist in the care of this patient.  Time Total: 50 min.   Visit consisted of counseling and education dealing with the complex and emotionally intense issues of symptom management and palliative care in the setting of serious and potentially life-threatening illness.Greater than 50%  of this time was spent counseling and coordinating care related to the above assessment and plan.  Alda Lea, AGPCNP-BC  Palliative Medicine Team [phone]

## 2021-06-14 NOTE — Progress Notes (Signed)
Currently PROGRESS NOTE    Dylan [name].  XBD:[id number] DOB: [birth year] DOA: 06/03/2021 PCP: [name], MD   Chief Complain:AMS  Brief Narrative: Patient is a 80 year old male with history of CKD stage IIIa, paroxysmal A. fib,, complete AV block status post pacemaker, dementia, Parkinson's disease, peripheral artery disease, hypertension, hyperlipidemia, diabetes who presented with altered mental status from [facility] facility.  He had 3 days of declining mentation and EMS was called.  He is total care at baseline due to significant dementia, does not communicate with anyone except for his wife.  He had very poor oral intake since last few days.  On presentation, lab work showed sodium of 152, potassium of 5.2.  Elevated anion gap of 17, creatinine of 4, severe hyperglycemia.  COVID screen test was positive.  Chest x-ray did not show any pneumonia.  CT did not show any acute intracranial abnormalities.  Patient was admitted for further management.  Started on insulin drip for severe hyperglycemia with elevated anion gap, IV fluids for severe AKI. Palliative care following for goals of care.  He continues to be encephalopathic,currently NpO  Assessment & Plan:   Principal Problem:   Acute encephalopathy Active Problems:   Peripheral arterial disease (HCC)   Type 2 diabetes mellitus with stage 3a chronic kidney disease, without long-term current use of insulin (HCC)   Acute renal failure superimposed on stage 3a chronic kidney disease ([place])   Parkinsonism [facility])   Pacemaker Dual chamber [employer] MRI  model [id number] 07/11/2020   Parkinson's disease ([place])   Hyperosmolar hyperglycemic state (HHS) ([place])   COVID-19 virus infection   Hypernatremia   Acute encephalopathy: History of Parkinson's disease at baseline.  Not alert and oriented at baseline.  Declining mental status possibly secondary to COVID, AKI, hyperglycemia.  Monitor mental status.  CT head did  not show any acute intracranial normalities.Speech therapy recommending NPO.  DKA/hyperglycemia: Presented with blood sugars in the range of 700s, elevated anion gap.  Continue IV fluids,started on insulin drip.  Gap is closed, blood sugars have improved.  We have stopped IV insulin, started on Lantus and sliding scale.  Monitor blood sugars.  Diabetic coordinator consulted.HbA1C was 7.1 as per 05/12/2021  Hypernatremia: Most likely secondary dehydration.  Continue hypotonic fluid  AKI on CKD stage IIIa: Baseline creatinine around 1.4.  Presented with creatinine in the range of 4.  Most likely secondary to dehydration from poor oral intake.  Continue IV fluids.  Renal function improving with IV fluids.  Renal ultrasound did not show any obstruction  COVID positivity: Fully vaccinated patient with incidental finding, no respiratory illness.  Chest x-ray did not show pneumonia.  Started on remdesivir due to high risk category.  History of paroxysmal A. fib: Takes Eliquis for anticoagulation.  Currently on hold due to AKI.  Patient is status post pacemaker placement for complete heart block.  Currently rate is controlled.  Also on carvedilol at baseline which is on hold.  He is currently in sinus rhythm  Hyperlipidemia: On pravastatin  History of peripheral artery disease/hypertension: On carvedilol, currently on hold  History of Parkinson's dementia: Total dependent on care.  Lives in a skilled nursing facility.  On Sinemet.  Also on rivastigmine, Seroquel, Namenda which are on hold.  Monitor mental status.  Delirium precautions.  Goals of care: Extremely deconditioned male, dementia at baseline, minimally verbal, with poor quality of life.  Palliative care consult for goals of care.  He can be a candidate  for hospice.          DVT prophylaxis:SCD Code Status: DNR Family Communication:Discussed with wife face to face on 06/14/21 Status is: Observation  Inpatient  Dispo: The patient is  from: SNF              Anticipated d/c is to: SNF              Patient currently is not medically stable to d/c.   Difficult to place patient No      Consultants: None  Procedures:None  Antimicrobials:  Anti-infectives (From admission, onward)    Start     Dose/Rate Route Frequency Ordered Stop   06/14/21 1000  remdesivir 100 mg in sodium chloride 0.9 % 100 mL IVPB       See Hyperspace for full Linked Orders Report.   100 mg 200 mL/hr over 30 Minutes Intravenous Daily 06/13/21 0047 06/16/21 0959   06/13/21 0100  remdesivir 200 mg in sodium chloride 0.9% 250 mL IVPB       See Hyperspace for full Linked Orders Report.   200 mg 580 mL/hr over 30 Minutes Intravenous Once 06/13/21 0047 06/13/21 0346       Subjective:  Patient seen and examined at the bedside this morning.  Hemodynamically stable during my evaluation except for tachypnea.  Not requiring any oxygen.  He responds to painful stimuli and verbal stimuli and moans while calling his name.  Does not obey any commands.  Objective: Vitals:   06/14/21 0330 06/14/21 0345 06/14/21 0400 06/14/21 0600  BP: 134/73 128/61 (!) 120/58 (!) 122/57  Pulse: 95 97 93 90  Resp: (!) 25 (!) 37 (!) 35 (!) 33  Temp:   100.3 F (37.9 C)   TempSrc:   Rectal   SpO2: 91% 97% 99% 98%    Intake/Output Summary (Last 24 hours) at 06/14/2021 0815 Last data filed at 06/13/2021 1002 Gross per 24 hour  Intake 6.12 ml  Output --  Net 6.12 ml   There were no vitals filed for this visit.  Examination:  General exam: Lethargic, chronically ill looking HEENT: Eyes closed  Respiratory system:  no wheezes or crackles  Cardiovascular system: S1 & S2 heard, RRR.  Gastrointestinal system: Abdomen is nondistended, soft and nontender. Central nervous system: Not alert and oriented Extremities: No edema, no clubbing ,no cyanosis Skin: No rashes, no ulcers,no icterus      Data Reviewed: I have personally reviewed following labs and imaging  studies  CBC: Recent Labs  Lab 06/19/2021 2039 06/15/2021 2116  WBC 7.7  --   NEUTROABS 5.7  --   HGB 12.8* 12.6*  HCT 42.8 37.0*  MCV 96.4  --   PLT 249  --    Basic Metabolic Panel: Recent Labs  Lab 06/26/2021 2300 06/13/21 0216 06/13/21 0813 06/14/21 0014 06/14/21 0658  NA 152* 153* 157* 155* 154*  K 5.4* 3.9 3.8 4.1 4.0  CL 116* 118* 123* 121* 123*  CO2 19* 19* 22 23 20*  GLUCOSE 790* 558* 148* 207* 282*  BUN 145* 136* 134* 136* 135*  CREATININE 4.02* 3.50* 3.49* 3.20* 3.05*  CALCIUM 9.4 9.3 9.2 9.0 8.7*  MG  --  2.5*  --   --   --    GFR: CrCl cannot be calculated (Unknown ideal weight.). Liver Function Tests: Recent Labs  Lab 06/09/2021 2039  AST 24  ALT 7  ALKPHOS 85  BILITOT 1.1  PROT 6.7  ALBUMIN 2.5*   No results for  input(s): LIPASE, AMYLASE in the last 168 hours. Recent Labs  Lab 06/26/2021 2108  AMMONIA 12   Coagulation Profile: Recent Labs  Lab 06/09/2021 2039  INR 2.2*   Cardiac Enzymes: No results for input(s): CKTOTAL, CKMB, CKMBINDEX, TROPONINI in the last 168 hours. BNP (last 3 results) No results for input(s): PROBNP in the last 8760 hours. HbA1C: No results for input(s): HGBA1C in the last 72 hours. CBG: Recent Labs  Lab 06/13/21 0956 06/13/21 1209 06/13/21 1640 06/13/21 2205 06/14/21 0756  GLUCAP 111* 93 202* 185* 264*   Lipid Profile: No results for input(s): CHOL, HDL, LDLCALC, TRIG, CHOLHDL, LDLDIRECT in the last 72 hours. Thyroid Function Tests: No results for input(s): TSH, T4TOTAL, FREET4, T3FREE, THYROIDAB in the last 72 hours. Anemia Panel: No results for input(s): VITAMINB12, FOLATE, FERRITIN, TIBC, IRON, RETICCTPCT in the last 72 hours. Sepsis Labs: No results for input(s): PROCALCITON, LATICACIDVEN in the last 168 hours.  Recent Results (from the past 240 hour(s))  Resp Panel by RT-PCR (Flu A&B, Covid) Nasopharyngeal Swab     Status: Abnormal   Collection Time: 06/24/2021  8:39 PM   Specimen: Nasopharyngeal Swab;  Nasopharyngeal(NP) swabs in vial transport medium  Result Value Ref Range Status   SARS Coronavirus 2 by RT PCR POSITIVE (A) NEGATIVE Final    Comment: RESULT CALLED TO, READ BACK BY AND VERIFIED WITH: RN G TATE AT 2251 06/19/2021 BY L BENFIELD (NOTE) SARS-CoV-2 target nucleic acids are DETECTED.  The SARS-CoV-2 RNA is generally detectable in upper respiratory specimens during the acute phase of infection. Positive results are indicative of the presence of the identified virus, but do not rule out bacterial infection or co-infection with other pathogens not detected by the test. Clinical correlation with patient history and other diagnostic information is necessary to determine patient infection status. The expected result is Negative.  Fact Sheet for Patients: EntrepreneurPulse.com.au  Fact Sheet for Healthcare Providers: IncredibleEmployment.be  This test is not yet approved or cleared by the [place] FDA and  has been authorized for detection and/or diagnosis of SARS-CoV-2 by FDA under an Emergency Use Authorization (EUA).  This EUA will remain in effect (meaning this tes t can be used) for the duration of  the COVID-19 declaration under Section 564(b)(1) of the Act, 21 U.S.C. section 360bbb-3(b)(1), unless the authorization is terminated or revoked sooner.     Influenza A by PCR NEGATIVE NEGATIVE Final   Influenza B by PCR NEGATIVE NEGATIVE Final    Comment: (NOTE) The Xpert Xpress SARS-CoV-2/FLU/RSV plus assay is intended as an aid in the diagnosis of influenza from Nasopharyngeal swab specimens and should not be used as a sole basis for treatment. Nasal washings and aspirates are unacceptable for Xpert Xpress SARS-CoV-2/FLU/RSV testing.  Fact Sheet for Patients: EntrepreneurPulse.com.au  Fact Sheet for Healthcare Providers: IncredibleEmployment.be  This test is not yet approved or cleared by  the [place] FDA and has been authorized for detection and/or diagnosis of SARS-CoV-2 by FDA under an Emergency Use Authorization (EUA). This EUA will remain in effect (meaning this test can be used) for the duration of the COVID-19 declaration under Section 564(b)(1) of the Act, 21 U.S.C. section 360bbb-3(b)(1), unless the authorization is terminated or revoked.  Performed at [facility], [place] [address] [place], [place] [address]          Radiology Studies: DG Chest 1 View  Result Date: 06/22/2021 CLINICAL DATA:  Altered mental status EXAM: CHEST  1 VIEW COMPARISON:  02/14/2021 FINDINGS:  Lungs are clear.  No pleural effusion or pneumothorax. The heart is normal in size.  Left subclavian pacemaker. IMPRESSION: No evidence of acute cardiopulmonary disease. Electronically Signed   By: Julian Hy M.D.   On: 06/30/2021 21:42   DG Knee 2 Views Right  Result Date: 06/22/2021 CLINICAL DATA:  Pain EXAM: RIGHT KNEE - 1-2 VIEW COMPARISON:  None. FINDINGS: No fracture or dislocation is seen. The joint spaces are preserved. The visualized soft tissues are unremarkable. No suprapatellar knee joint effusion. IMPRESSION: Negative. Electronically Signed   By: Julian Hy M.D.   On: 06/07/2021 21:42   CT HEAD WO CONTRAST  Result Date: 06/01/2021 CLINICAL DATA:  Altered mental status EXAM: CT HEAD WITHOUT CONTRAST TECHNIQUE: Contiguous axial images were obtained from the base of the skull through the vertex without intravenous contrast. COMPARISON:  02/27/2021 FINDINGS: Brain: No evidence of acute infarction, hemorrhage, hydrocephalus, extra-axial collection or mass lesion/mass effect. Global cortical and central atrophy. Subcortical white matter and periventricular small vessel ischemic changes. Vascular: Intracranial atherosclerosis. Skull: Normal. Negative for fracture or focal lesion. Sinuses/Orbits: The visualized paranasal sinuses are essentially clear. The mastoid air cells  are unopacified. Other: None. IMPRESSION: No evidence of acute intracranial abnormality. Atrophy with small vessel ischemic changes. Electronically Signed   By: Julian Hy M.D.   On: 06/09/2021 21:27   US Renal  Result Date: 06/13/2021 CLINICAL DATA:  Acute renal failure EXAM: RENAL / URINARY TRACT ULTRASOUND COMPLETE COMPARISON:  CT abdomen/pelvis dated 02/14/2021 FINDINGS: Right Kidney: Renal measurements: 10.6 x 6.1 x 4.7 cm = volume: 159 mL. Echogenicity within normal limits. No mass or hydronephrosis visualized. Left Kidney: Not discretely visualized. Bladder: Appears normal for degree of bladder distention. Other: None. IMPRESSION: Left kidney is not discretely visualized. Right kidney is unremarkable.  No hydronephrosis. Electronically Signed   By: Julian Hy M.D.   On: 06/13/2021 00:45        Scheduled Meds:  carbidopa-levodopa  1 tablet Oral BID   free water  200 mL Per Tube Q8H   insulin aspart  0-5 Units Subcutaneous QHS   insulin aspart  0-9 Units Subcutaneous TID WC   insulin glargine-yfgn  15 Units Subcutaneous Daily   sodium chloride flush  3 mL Intravenous Q12H   Continuous Infusions:  sodium chloride 100 mL/hr at 06/14/21 0002   remdesivir 100 mg in NS 100 mL       LOS: 1 day    Time spent:35 mins. More than 50% of that time was spent in counseling and/or coordination of care.      Shelly Coss, MD [facility] P8/15/2022, 8:15 AM

## 2021-06-14 NOTE — Evaluation (Signed)
Clinical/Bedside Swallow Evaluation Patient Details  Name: Dylan [name]. MRN: [id number] Date of Birth: 09-24-1941  Today's Date: 06/14/2021 Time: SLP Start Time (ACUTE ONLY): 1125 SLP Stop Time (ACUTE ONLY): 1145 SLP Time Calculation (min) (ACUTE ONLY): 20 min  Past Medical History:  Past Medical History:  Diagnosis Date   Anxiety    Aortic stenosis    mild AS 11/24/16 (peak grad 24, mean grad 11) Dr. 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 [name])   Vitamin B12 deficiency anemia 03/01/2018   Vitamin D deficiency disease    Past Surgical History:  Past Surgical History:  Procedure Laterality Date   CARDIOVASCULAR STRESS TEST     11/21/16 Low risk study, EF 52% [facility])   EYE SURGERY     KYPHOPLASTY N/A 02/15/2017   Procedure: LUMBAR FOUR KYPHOPLASTY;  Surgeon: Phylliss Bob, MD;  Location: [facility];  Service: Orthopedics;  Laterality: N/A;   lipoma surgery     neck - 30 years ago   PACEMAKER IMPLANT N/A 07/11/2017   Procedure: Pacemaker Implant;  Surgeon: Constance Haw, MD;  Location: [facility];  Service: Cardiovascular;  Laterality: N/A;   TRANSTHORACIC ECHOCARDIOGRAM     11/24/16 [facility] CV): EF 55-60%, mild AS, mild-mod MR, mod TR, moderate pulm HTN, PAP 49 mmHg   HPI:  Patient is a 80 year old male presented to ED from SNF with altered mental status and poor PO  intake x three days. Dx DKA, hypernatremia, COVID +.  PMHx of CKD stage IIIa, paroxysmal A. fib,, complete AV block status post pacemaker, dementia, Parkinson's disease, peripheral artery disease, hypertension, hyperlipidemia, diabetes whoand EMS was called.  He is total care at baseline due to significant dementia, does not communicate with anyone except for his wife. He had a swallow eval at [facility] in April of 2022, and despite not being able to f/c at that time, his spontaneous, automatic responses allowed him to eat a mechanical soft diet and drink thin liquids without difficulty.   Assessment / Plan / Recommendation Clinical Impression  Pt participated in limited swallowing assessment.  Oral care provided, removing thick secretions with food debris.  Pt opened his eyes once; did not follow commands.  However, he accepted ice chips and teaspoons of water with good oral seal and active mastication of ice chips.  He demonstrated a palpable swallow response, each of which was followed by productive coughing, allowing more oral suctioning which continued to remove pharyngeal secretions.  Pt needs intensive oral hygiene; allow a few ice chips (3-4) after oral care to facilitate a swallow response.  He is not ready for a PO diet.  SLP will follow for readiness.  Palliative care is involved.  Will follow for [place]. SLP Visit Diagnosis: Dysphagia, unspecified (R13.10)    Aspiration Risk  Moderate aspiration risk    Diet Recommendation  NPO; ice chips after oral care      Other  Recommendations Oral Care Recommendations: Oral care prior to ice chip/H20;Oral care QID   Follow up Recommendations Skilled Nursing facility      Frequency and Duration min 3x week  2 weeks       Prognosis Prognosis for Safe Diet Advancement: Fair      Swallow Study   General Date of Onset: 06/13/21 HPI: Patient is a 80 year old male presented to ED from SNF with altered mental status and poor PO intake x three days.  Dx DKA, hypernatremia, COVID +.  PMHx of CKD stage IIIa, paroxysmal A. fib,, complete AV block status post pacemaker, dementia, Parkinson's disease, peripheral artery disease, hypertension, hyperlipidemia, diabetes whoand EMS was called.  He is total care at baseline due to significant dementia, does not communicate with anyone except for his wife. Type of Study: Bedside Swallow Evaluation Previous Swallow Assessment: 02/10/21 -unable to follow commands, but swallowed well and was recommended to have dysphagia 3, thin liquids Diet Prior to this Study: NPO Temperature Spikes Noted: Yes Respiratory Status: Room air History of Recent Intubation: No Behavior/Cognition: Lethargic/Drowsy Oral Cavity Assessment: Dried secretions;Excessive secretions Oral Care Completed by SLP: Yes Oral Cavity - Dentition: Edentulous Self-Feeding Abilities: Total assist Patient Positioning: Upright in bed Baseline Vocal Quality: Not observed Volitional Cough: Cognitively unable to elicit Volitional Swallow: Unable to elicit    Oral/Motor/Sensory Function Overall Oral Motor/Sensory Function:  (unable to assess)   Ice Chips Ice chips: Impaired Presentation: Spoon Oral Phase Functional Implications: Prolonged oral transit Pharyngeal Phase Impairments: Cough - Immediate   Thin Liquid Thin Liquid: Impaired Presentation: Spoon Pharyngeal  Phase Impairments: Cough - Immediate    Nectar Thick Nectar Thick Liquid: Not tested   Honey Thick Honey Thick Liquid: Not tested   Puree Puree: Not tested   Solid     Solid: Not tested     [name], [place] Office number [phone] Pager [phone]  [name] [facility] 06/14/2021,12:02 PM

## 2021-06-15 DIAGNOSIS — E1311 Other specified diabetes mellitus with ketoacidosis with coma: Secondary | ICD-10-CM | POA: Diagnosis not present

## 2021-06-15 DIAGNOSIS — G9340 Encephalopathy, unspecified: Secondary | ICD-10-CM | POA: Diagnosis not present

## 2021-06-15 DIAGNOSIS — U071 COVID-19: Secondary | ICD-10-CM | POA: Diagnosis not present

## 2021-06-15 DIAGNOSIS — N179 Acute kidney failure, unspecified: Secondary | ICD-10-CM | POA: Diagnosis not present

## 2021-06-15 DIAGNOSIS — G20 Parkinson's disease: Secondary | ICD-10-CM | POA: Diagnosis not present

## 2021-06-15 LAB — CBC WITH DIFFERENTIAL/PLATELET
Abs Immature Granulocytes: 0.07 10*3/uL (ref 0.00–0.07)
Basophils Absolute: 0 10*3/uL (ref 0.0–0.1)
Basophils Relative: 0 %
Eosinophils Absolute: 0.1 10*3/uL (ref 0.0–0.5)
Eosinophils Relative: 1 %
HCT: 30.8 % — ABNORMAL LOW (ref 39.0–52.0)
Hemoglobin: 9.9 g/dL — ABNORMAL LOW (ref 13.0–17.0)
Immature Granulocytes: 1 %
Lymphocytes Relative: 12 %
Lymphs Abs: 1 10*3/uL (ref 0.7–4.0)
MCH: 29 pg (ref 26.0–34.0)
MCHC: 32.1 g/dL (ref 30.0–36.0)
MCV: 90.3 fL (ref 80.0–100.0)
Monocytes Absolute: 0.6 10*3/uL (ref 0.1–1.0)
Monocytes Relative: 8 %
Neutro Abs: 6.4 10*3/uL (ref 1.7–7.7)
Neutrophils Relative %: 78 %
Platelets: 175 10*3/uL (ref 150–400)
RBC: 3.41 MIL/uL — ABNORMAL LOW (ref 4.22–5.81)
RDW: 13.5 % (ref 11.5–15.5)
WBC: 8.1 10*3/uL (ref 4.0–10.5)
nRBC: 0 % (ref 0.0–0.2)

## 2021-06-15 LAB — BASIC METABOLIC PANEL
Anion gap: 10 (ref 5–15)
BUN: 131 mg/dL — ABNORMAL HIGH (ref 8–23)
CO2: 19 mmol/L — ABNORMAL LOW (ref 22–32)
Calcium: 8.6 mg/dL — ABNORMAL LOW (ref 8.9–10.3)
Chloride: 125 mmol/L — ABNORMAL HIGH (ref 98–111)
Creatinine, Ser: 2.97 mg/dL — ABNORMAL HIGH (ref 0.61–1.24)
GFR, Estimated: 21 mL/min — ABNORMAL LOW (ref >60)
Glucose, Bld: 173 mg/dL — ABNORMAL HIGH (ref 70–99)
Potassium: 3.8 mmol/L (ref 3.5–5.1)
Sodium: 154 mmol/L — ABNORMAL HIGH (ref 135–145)

## 2021-06-15 LAB — GLUCOSE, CAPILLARY
Glucose-Capillary: 172 mg/dL — ABNORMAL HIGH (ref 70–99)
Glucose-Capillary: 186 mg/dL — ABNORMAL HIGH (ref 70–99)
Glucose-Capillary: 189 mg/dL — ABNORMAL HIGH (ref 70–99)

## 2021-06-15 MED ORDER — FENTANYL CITRATE (PF) 100 MCG/2ML IJ SOLN: 25.0000 ug | INTRAMUSCULAR | Status: DC | PRN

## 2021-06-15 MED ORDER — ONDANSETRON HCL 4 MG/2ML IJ SOLN: 4.0000 mg | Freq: Four times a day (QID) | INTRAMUSCULAR | Status: DC | PRN

## 2021-06-15 MED ORDER — GLYCOPYRROLATE 0.2 MG/ML IJ SOLN: 0.2000 mg | INTRAMUSCULAR | Status: DC | PRN

## 2021-06-15 MED ORDER — BIOTENE DRY MOUTH MT LIQD: 15.0000 mL | OROMUCOSAL | Status: DC | PRN

## 2021-06-15 MED ORDER — DEXTROSE 5 % IV SOLN: INTRAVENOUS | Status: DC

## 2021-06-15 MED ORDER — HALOPERIDOL LACTATE 5 MG/ML IJ SOLN: 0.5000 mg | INTRAMUSCULAR | Status: DC | PRN

## 2021-06-15 MED ORDER — COLLAGENASE 250 UNIT/GM EX OINT
TOPICAL_OINTMENT | Freq: Two times a day (BID) | CUTANEOUS | Status: DC
  Filled 2021-06-15: qty 30

## 2021-06-15 MED ORDER — POLYVINYL ALCOHOL 1.4 % OP SOLN
1.0000 [drp] | Freq: Four times a day (QID) | OPHTHALMIC | Status: DC | PRN
  Filled 2021-06-15: qty 15

## 2021-06-15 NOTE — Consult Note (Addendum)
[place] Nurse Consult Note: Patient receiving care in [facility] [phone]. Covid + Reason for Consult: Wounds to sacrum and right foot. Wound type: Unstageable to the the sacrum, right heel DTPI to right anterior lateral foot and medial side of the right foot Pressure Injury POA: Yes Measurement: See flow sheet Wound bed: DTPIs are purple/maroon. Right heel is black with dry skin peeling off  The sacral wound is 75% black eschar surrounded by pink fibrous tissue  Dressing procedure/placement/frequency: Paint the right heel with Betadine twice daily. Allow to air dry then place a heel foam dressing to both heels and bilateral feet in Prevalon Heel Lift Boots Kellie Simmering # [phone]) Clean the sacrum with no rinse cleanser, pat dry then apply a nickel thick layer of Sanyl on the wound. Cover with a moistened saline gauze, dry gauze and a sacral foam dressing. Apply Santyl twice daily. May change foam dressing every 3 days or PRN soiling.  Replace mattress with standard air mattress.          Monitor the wound area(s) for worsening of condition such as: Signs/symptoms of infection, increase in size, development of or worsening of odor, development of pain, or increased pain at the affected locations.   Notify the medical team if any of these develop.  Thank you for the consult. [place] nurse will not follow at this time.   Please re-consult the [place] team if needed.  Cathlean Marseilles Tamala Julian, MSN, RN, [place], Lysle Pearl, [facility] Wound Treatment Associate Pager [phone]

## 2021-06-15 NOTE — Progress Notes (Signed)
Daily Progress Note   Patient Name: Dylan [name].       Date: 06/15/2021 DOB: October 20, 1941  Age: 80 y.o. MRN#: [id number] Attending Physician: Shelly Coss, MD Primary Care Physician: [name], MD Admit Date: 06/26/2021  Reason for Consultation/Follow-up: Establishing goals of care  Subjective: Chart Reviewed. Updates Received. Patient Assessed.   Patient lethargic. Occasionally opens eyes. Patient did show some response to staff when transferred to air mattress with moaning, withdrawing, and eye opening. Appetite remains poor with inability to further assess due to lethargy.   I met with wife at the bedside. Reviewed goals of care. Wife is tearful as she tried to engage with patient and no response. She shares at baseline patient was nonverbal but would provide some tracking and interactions via eye response and opening. She speaks to patient's continued decline and poor quality of life.   We discussed at length patient's poor nutrition and inability to sustain life. Mrs. Haugan shares her distress in watching patient's decline and continued suffer. She states he was a happy man who loved doing things for people and his independency. He would not wish to live in his current state of health.   Education provided on comfort focused care versus continued medical interventions. Wife acknowledges patient will not show any meaningful recovery or improvement. She expresses wishes to transition all care to focus on his comfort and symptom management.   We discussed outpatient hospice support. Education was provided on their goals and philosophy of care in addition to support at [facility]. Wife states she does not wish for patient to return to [place] and is hopeful for hospice home placement. Education provided on referral process with limitations of patient's ability to transfer in the setting of [place]. Wife verbalized understanding and appreciation again confirming wishes for  patient not to return to facility.   All questions answered and support provided.   Length of Stay: 2 days  Vital Signs: BP (!) 110/59 (BP Location: Left Arm)   Pulse 80   Temp 97.6 F (36.4 C) (Axillary)   Resp 19   Wt 63.2 kg   SpO2 97%   BMI 22.49 kg/m  SpO2: SpO2: 97 % O2 Device: O2 Device: Room Air O2 Flow Rate:    Physical Exam: Lethargic, cachectic, chronically-ill appearing RRR Diminished bilaterally No edema, muscle wasting Lethargic, unable to follow commands, nonverbal at baseline             Palliative Care Assessment & Plan  HPI: Per intake H&P --> Patient is a 80 year old male with history of CKD stage IIIa, paroxysmal A. fib,, complete AV block status post pacemaker, dementia, Parkinson's disease, peripheral artery disease, hypertension, hyperlipidemia, diabetes who presented with altered mental status from [facility] facility.  He had 3 days of declining mentation and EMS was called.  He is total care at baseline due to significant dementia, does not communicate with anyone except for his wife.  He had very poor oral intake since last few days.  On presentation, lab work showed sodium of 152, potassium of 5.2.  Elevated anion gap of 17, creatinine of 4, severe hyperglycemia.  COVID screen test was positive.  Chest x-ray did not show any pneumonia.  CT did not show any acute intracranial abnormalities.  Patient was admitted for further management.  Started on insulin drip for severe hyperglycemia with elevated anion gap, IV fluids for severe AKI.  Code Status: DNR  Goals of Care/Recommendations: DNR/DNI, no artificial feeding Wife  has requested all care transition to comfort focused only Will minimize medications and discontinue interventions not comfort focused Fentanyl PRN for pain/air hunger/comfort Robinul PRN for excessive secretions Ativan PRN for agitation/anxiety Zofran PRN for nausea Liquifilm tears PRN for dry eyes Haldol PRN for  agitation/anxiety May have comfort feeding Comfort cart for family Unrestricted visitations in the setting of EOL (per policy) Oxygen PRN 2L or less for comfort. No escalation.   PMT will continue to support and follow.   Prognosis: POOR  Discharge Planning: To Be Determined  Thank you for allowing the Palliative Medicine Team to assist in the care of this patient.  Time Total: 65 min.   Visit consisted of counseling and education dealing with the complex and emotionally intense issues of symptom management and palliative care in the setting of serious and potentially life-threatening illness.Greater than 50%  of this time was spent counseling and coordinating care related to the above assessment and plan.  Alda Lea, AGPCNP-BC  Palliative Medicine Team [phone]

## 2021-06-15 NOTE — Progress Notes (Signed)
   06/14/21 2015 06/14/21 2200  Assess: MEWS Score  Temp 99 F (37.2 C) 100 F (37.8 C)  BP (!) 125/54 127/65  Pulse Rate 95 89  Resp (!) 35 (!) 41  SpO2  --  93 %  Assess: MEWS Score  MEWS Temp 0 0  MEWS Systolic 0 0  MEWS Pulse 0 0  MEWS RR 2 3  MEWS LOC 1 1  MEWS Score 3 4  MEWS Score Color Yellow Red  Assess: if the MEWS score is Yellow or Red  Were vital signs taken at a resting state?  --  Yes  Focused Assessment  --  No change from prior assessment  Early Detection of Sepsis Score *See Row Information*  --  Medium  MEWS guidelines implemented *See Row Information*  --  Yes  Treat  MEWS Interventions  --  Escalated (See documentation below)  Pain Scale  --  PAINAD  Pain Intervention(s)  --  Repositioned  Breathing 1 1  Negative Vocalization 0 1  Facial Expression 0 0  Body Language 0 0  Consolability 0 0  PAINAD Score 1 2  Take Vital Signs  Increase Vital Sign Frequency   --  Red: Q 1hr X 4 then Q 4hr X 4, if remains red, continue Q 4hrs  Escalate  MEWS: Escalate  --  Red: discuss with charge nurse/RN and provider, consider discussing with RRT  Notify: Charge Nurse/RN  Name of Charge Nurse/RN Notified  --  Elmyra Ricks  Date Charge Nurse/RN Notified  --  06/14/21  Time Charge Nurse/RN Notified  --  2200  Notify: Provider  Provider Name/Title  --  Dr Nevada Crane  Date Provider Notified  --  06/15/21  Time Provider Notified  --  [phone]  Notification Type  --  Page  Notification Reason  --  Critical result (notified pt on Red and yellow [place] for RR, and temp)  Provider response  --  Other (Comment) (aware ,continue current orders)  Date of Provider Response  --  06/15/21  Time of Provider Response  --  0150  Document  Patient Outcome  --  Not stable and remains on department  Progress note created (see row info)  --  Yes

## 2021-06-15 NOTE — Progress Notes (Signed)
SLP Cancellation Note  Patient Details Name: Dylan [name]. MRN: [id number] DOB: 02-18-1941   Cancelled treatment:       Reason Eval/Treat Not Completed: Patient's level of consciousness. Per discussion with RN, Jenny Reichmann; pt not exhibiting adequate alertness for PO trials with SLP presently. Upcoming family palliative meeting planned at 12 pm this date.  SLP will continue to follow for goals of care and PO trials and pts clinical status allows.    [place], CCC-SLP Acute Rehabilitation Services   06/15/2021, 11:27 AM

## 2021-06-15 NOTE — Progress Notes (Signed)
   06/15/21 1415  Assess: MEWS Score  Temp 98.5 F (36.9 C)  BP 130/71  Pulse Rate 87  ECG Heart Rate 87  Resp (!) 30  SpO2 100 %  Assess: MEWS Score  MEWS Temp 0  MEWS Systolic 0  MEWS Pulse 0  MEWS RR 2  MEWS LOC 2  MEWS Score 4  MEWS Score Color Red  Assess: if the MEWS score is Yellow or Red  Were vital signs taken at a resting state? Yes  Focused Assessment No change from prior assessment  Early Detection of Sepsis Score *See Row Information* High  MEWS guidelines implemented *See Row Information* Yes  Treat  MEWS Interventions Escalated (See documentation below)  Pain Scale PAINAD  Pain Intervention(s) Repositioned  Breathing 0 (Known tachypnea)  Negative Vocalization 0  Facial Expression 0  Body Language 0  Consolability 0  PAINAD Score 0  Take Vital Signs  Increase Vital Sign Frequency  Red: Q 1hr X 4 then Q 4hr X 4, if remains red, continue Q 4hrs  Escalate  MEWS: Escalate Red: discuss with charge nurse/RN and provider, consider discussing with RRT  Notify: Charge Nurse/RN  Name of Charge Nurse/RN Notified [place]  Date Charge Nurse/RN Notified 06/15/21  Time Charge Nurse/RN Notified 1415  Notify: Provider  Provider Name/Title Dr. Tawanna Solo  Date Provider Notified 06/15/21  Time Provider Notified 1545  Notification Type Page  Notification Reason Change in status  Provider response No new orders  Date of Provider Response 06/15/21  Time of Provider Response 1546  Document  Patient Outcome Not stable and remains on department  Progress note created (see row info) Yes

## 2021-06-15 NOTE — Progress Notes (Signed)
Currently PROGRESS NOTE    Dylan [name].  ION:[id number] DOB: 12-Mar-1941 DOA: 06/01/2021 PCP: [name], MD   Chief Complain:AMS  Brief Narrative: Patient is a 80 year old male with history of CKD stage IIIa, paroxysmal A. fib,, complete AV block status post pacemaker, dementia, Parkinson's disease, peripheral artery disease, hypertension, hyperlipidemia, diabetes who presented with altered mental status from [place] facility.  He had 3 days of declining mentation and EMS was called.  He is total care at baseline due to significant dementia, does not communicate with anyone except for his wife.  He had very poor oral intake since last few days.  On presentation, lab work showed sodium of 152, potassium of 5.2.  Elevated anion gap of 17, creatinine of 4, severe hyperglycemia.  COVID screen test was positive.  Chest x-ray did not show any pneumonia.  CT did not show any acute intracranial abnormalities.  Patient was admitted for further management.  Started on insulin drip for severe hyperglycemia with elevated anion gap, IV fluids for severe AKI. Palliative care following for goals of care.  He continues to be encephalopathic,currently NpO  Assessment & Plan:   Principal Problem:   Acute encephalopathy Active Problems:   Peripheral arterial disease (HCC)   Type 2 diabetes mellitus with stage 3a chronic kidney disease, without long-term current use of insulin (HCC)   Acute renal failure superimposed on stage 3a chronic kidney disease ([place])   Parkinsonism [facility])   Pacemaker Dual chamber [employer] Medical Assurity MRI  model [id number] 07/11/2020   Parkinson's disease ([place])   Hyperosmolar hyperglycemic state (HHS) ([place])   COVID-19 virus infection   Hypernatremia   Acute encephalopathy: History of Parkinson's disease at baseline.  Not alert and oriented at baseline.  Declining mental status possibly secondary to COVID, AKI, hyperglycemia.  Monitor mental status.  CT head did  not show any acute intracranial normalities.Speech therapy recommending NPO.  DKA/hyperglycemia: Presented with blood sugars in the range of 700s, elevated anion gap.  Continue IV fluids,started on insulin drip.  Gap is closed, blood sugars have improved.  We have stopped IV insulin, started on Lantus and sliding scale.  Monitor blood sugars.  Diabetic coordinator consulted.HbA1C was 7.1 as per 05/12/2021  Hypernatremia: Most likely secondary dehydration.  Continue hypotonic fluid,fluid changed to D5 now  AKI on CKD stage IIIa: Baseline creatinine around 1.4.  Presented with creatinine in the range of 4.  Most likely secondary to dehydration from poor oral intake.  Will slow the rate of fluid.He is making good urine.  Renal ultrasound did not show any obstruction  COVID positivity: Fully vaccinated patient with incidental finding, no respiratory illness.  Chest x-ray did not show pneumonia.  Started on remdesivir due to high risk category.  History of paroxysmal A. fib: Takes Eliquis for anticoagulation.  Currently on hold due to AKI.  Patient is status post pacemaker placement for complete heart block.  Currently rate is controlled.  Also on carvedilol at baseline which is on hold.  He is currently in sinus rhythm  Hyperlipidemia: On pravastatin  History of peripheral artery disease/hypertension: On carvedilol, currently on hold  History of Parkinson's dementia: Total dependent on care.  Lives in a skilled nursing facility.  On Sinemet.  Also on rivastigmine, Seroquel, Namenda which are on hold.  Monitor mental status.  Delirium precautions.Confused at baseline, though wife denies history of dementia  Goals of care: Extremely deconditioned male, dementia at baseline, minimally verbal, with poor quality of life.  Palliative  care consult for goals of care.  He can be a candidate for hospice.  Palliative care conducting family meeting today  Pressure Injury 06/14/21 Sacrum Medial;Lower (Active)   06/14/21 1600  Location: Sacrum  Location Orientation: Medial;Lower  Staging:   Wound Description (Comments):   Present on Admission: Yes     Pressure Injury 06/14/21 Heel Right (Active)  06/14/21 1600  Location: Heel  Location Orientation: Right  Staging:   Wound Description (Comments):   Present on Admission: Yes     Pressure Injury 06/14/21 Foot Right;Anterior;Lateral (Active)  06/14/21 1600  Location: Foot  Location Orientation: Right;Anterior;Lateral  Staging:   Wound Description (Comments):   Present on Admission:      Pressure Injury 06/14/21 Foot Anterior;Right;Medial Deep Tissue Pressure Injury - Purple or maroon localized area of discolored intact skin or blood-filled blister due to damage of underlying soft tissue from pressure and/or shear. (Active)  06/14/21 1600  Location: Foot  Location Orientation: Anterior;Right;Medial  Staging: Deep Tissue Pressure Injury - Purple or maroon localized area of discolored intact skin or blood-filled blister due to damage of underlying soft tissue from pressure and/or shear.  Wound Description (Comments):   Present on Admission:                     DVT prophylaxis:SCD Code Status: DNR Family Communication:Discussed with wife face to face on 06/14/21 Status is: Observation  Inpatient  Dispo: The patient is from: SNF              Anticipated d/c is to: SNF vs comfort care              Patient currently is not medically stable to d/c.   Difficult to place patient No      Consultants: None  Procedures:None  Antimicrobials:  Anti-infectives (From admission, onward)    Start     Dose/Rate Route Frequency Ordered Stop   06/14/21 1000  remdesivir 100 mg in sodium chloride 0.9 % 100 mL IVPB       See Hyperspace for full Linked Orders Report.   100 mg 200 mL/hr over 30 Minutes Intravenous Daily 06/13/21 0047 06/16/21 0959   06/13/21 0100  remdesivir 200 mg in sodium chloride 0.9% 250 mL IVPB       See  Hyperspace for full Linked Orders Report.   200 mg 580 mL/hr over 30 Minutes Intravenous Once 06/13/21 0047 06/13/21 0346       Subjective:  Patient seen and examined the bedside this morning.  Looks seem like yesterday, mostly nonresponsive.  Responsive only to painful stimuli.  He is having some urine output as seen in the Foley bag.  Hemodynamically stable  Objective: Vitals:   06/15/21 0203 06/15/21 0400 06/15/21 0500 06/15/21 0600  BP: 137/71 137/68  (!) 114/55  Pulse: 81 89  91  Resp: (!) 43 (!) 33  (!) 31  Temp:  100.1 F (37.8 C)  99.7 F (37.6 C)  TempSrc:  Axillary  Axillary  SpO2: 95% 95%  97%  Weight:   63.2 kg     Intake/Output Summary (Last 24 hours) at 06/15/2021 0806 Last data filed at 06/15/2021 0530 Gross per 24 hour  Intake 4028.8 ml  Output 1650 ml  Net 2378.8 ml   Filed Weights   06/14/21 1700 06/15/21 0500  Weight: 62 kg 63.2 kg    Examination:   General exam: Very lethargic, chronically ill looking, eyes closed , responds only to painful stimuli Respiratory system:  no wheezes or crackles  Cardiovascular system: S1 & S2 heard, RRR.  Gastrointestinal system: Abdomen is nondistended, soft and nontender. Central nervous system: Not Alert and oriented Extremities: No edema, no clubbing ,no cyanosis Skin: pressure ulcers as above QI:ONGEX   Data Reviewed: I have personally reviewed following labs and imaging studies  CBC: Recent Labs  Lab 06/06/2021 2039 06/16/2021 2116 06/15/21 0155  WBC 7.7  --  8.1  NEUTROABS 5.7  --  6.4  HGB 12.8* 12.6* 9.9*  HCT 42.8 37.0* 30.8*  MCV 96.4  --  90.3  PLT 249  --  528   Basic Metabolic Panel: Recent Labs  Lab 06/13/21 0216 06/13/21 0813 06/14/21 0014 06/14/21 0658 06/14/21 1810 06/15/21 0155  NA 153* 157* 155* 154* 154* 154*  K 3.9 3.8 4.1 4.0 4.0 3.8  CL 118* 123* 121* 123* 123* 125*  CO2 19* 22 23 20* 22 19*  GLUCOSE 558* 148* 207* 282* 160* 173*  BUN 136* 134* 136* 135* 131* 131*   CREATININE 3.50* 3.49* 3.20* 3.05* 3.03* 2.97*  CALCIUM 9.3 9.2 9.0 8.7* 8.9 8.6*  MG 2.5*  --   --   --   --   --    GFR: Estimated Creatinine Clearance: 18 mL/min (A) (by C-G formula based on SCr of 2.97 mg/dL (H)). Liver Function Tests: Recent Labs  Lab 06/26/2021 2039  AST 24  ALT 7  ALKPHOS 85  BILITOT 1.1  PROT 6.7  ALBUMIN 2.5*   No results for input(s): LIPASE, AMYLASE in the last 168 hours. Recent Labs  Lab 06/17/2021 2108  AMMONIA 12   Coagulation Profile: Recent Labs  Lab 06/08/2021 2039  INR 2.2*   Cardiac Enzymes: No results for input(s): CKTOTAL, CKMB, CKMBINDEX, TROPONINI in the last 168 hours. BNP (last 3 results) No results for input(s): PROBNP in the last 8760 hours. HbA1C: No results for input(s): HGBA1C in the last 72 hours. CBG: Recent Labs  Lab 06/14/21 0756 06/14/21 1145 06/14/21 1758 06/14/21 1848 06/14/21 2025  GLUCAP 264* 209* 132* 132* 142*   Lipid Profile: No results for input(s): CHOL, HDL, LDLCALC, TRIG, CHOLHDL, LDLDIRECT in the last 72 hours. Thyroid Function Tests: No results for input(s): TSH, T4TOTAL, FREET4, T3FREE, THYROIDAB in the last 72 hours. Anemia Panel: No results for input(s): VITAMINB12, FOLATE, FERRITIN, TIBC, IRON, RETICCTPCT in the last 72 hours. Sepsis Labs: No results for input(s): PROCALCITON, LATICACIDVEN in the last 168 hours.  Recent Results (from the past 240 hour(s))  Resp Panel by RT-PCR (Flu A&B, Covid) Nasopharyngeal Swab     Status: Abnormal   Collection Time: 06/02/2021  8:39 PM   Specimen: Nasopharyngeal Swab; Nasopharyngeal(NP) swabs in vial transport medium  Result Value Ref Range Status   SARS Coronavirus 2 by RT PCR POSITIVE (A) NEGATIVE Final    Comment: RESULT CALLED TO, READ BACK BY AND VERIFIED WITH: RN G TATE AT 2251 06/02/2021 BY L BENFIELD (NOTE) SARS-CoV-2 target nucleic acids are DETECTED.  The SARS-CoV-2 RNA is generally detectable in upper respiratory specimens during the acute  phase of infection. Positive results are indicative of the presence of the identified virus, but do not rule out bacterial infection or co-infection with other pathogens not detected by the test. Clinical correlation with patient history and other diagnostic information is necessary to determine patient infection status. The expected result is Negative.  Fact Sheet for Patients: EntrepreneurPulse.com.au  Fact Sheet for Healthcare Providers: IncredibleEmployment.be  This test is not yet approved or cleared by the  [place] Architectural technologist and  has been authorized for detection and/or diagnosis of SARS-CoV-2 by FDA under an Print production planner (EUA).  This EUA will remain in effect (meaning this tes t can be used) for the duration of  the COVID-19 declaration under Section 564(b)(1) of the Act, 21 U.S.C. section 360bbb-3(b)(1), unless the authorization is terminated or revoked sooner.     Influenza A by PCR NEGATIVE NEGATIVE Final   Influenza B by PCR NEGATIVE NEGATIVE Final    Comment: (NOTE) The Xpert Xpress SARS-CoV-2/FLU/RSV plus assay is intended as an aid in the diagnosis of influenza from Nasopharyngeal swab specimens and should not be used as a sole basis for treatment. Nasal washings and aspirates are unacceptable for Xpert Xpress SARS-CoV-2/FLU/RSV testing.  Fact Sheet for Patients: EntrepreneurPulse.com.au  Fact Sheet for Healthcare Providers: IncredibleEmployment.be  This test is not yet approved or cleared by the [place] FDA and has been authorized for detection and/or diagnosis of SARS-CoV-2 by FDA under an Emergency Use Authorization (EUA). This EUA will remain in effect (meaning this test can be used) for the duration of the COVID-19 declaration under Section 564(b)(1) of the Act, 21 U.S.C. section 360bbb-3(b)(1), unless the authorization is terminated or revoked.  Performed at [facility], [place] [address], [place], [place] [address]          Radiology Studies: DG CHEST PORT 1 VIEW  Result Date: 06/14/2021 CLINICAL DATA:  Tachypnea EXAM: PORTABLE CHEST 1 VIEW COMPARISON:  06/19/2021 FINDINGS: Left-sided pacing device as before. Small focal opacity at the left infrahilar lung. No pleural effusion. Normal cardiomediastinal silhouette. Aortic atherosclerosis. No pneumothorax IMPRESSION: Left infrahilar opacity may reflect atelectasis or mild pneumonia. Radiographic follow-up to resolution recommended Electronically Signed   By: Donavan Foil M.D.   On: 06/14/2021 15:22        Scheduled Meds:  carbidopa-levodopa  1 tablet Oral BID   Chlorhexidine Gluconate Cloth  6 each Topical Daily   free water  200 mL Per Tube Q8H   insulin aspart  0-5 Units Subcutaneous QHS   insulin aspart  0-9 Units Subcutaneous TID WC   insulin glargine-yfgn  15 Units Subcutaneous Daily   sodium chloride flush  3 mL Intravenous Q12H   Continuous Infusions:  dextrose     remdesivir 100 mg in NS 100 mL Stopped (06/14/21 1103)     LOS: 2 days    Time spent:35 mins. More than 50% of that time was spent in counseling and/or coordination of care.      Shelly Coss, MD [facility] P8/16/2022, 8:06 AM

## 2021-06-15 NOTE — Progress Notes (Signed)
   06/14/21 2015  Assess: MEWS Score  Temp 99 F (37.2 C)  BP (!) 125/54  Pulse Rate 95  ECG Heart Rate 95  Resp (!) 35  Level of Consciousness Responds to Voice  SpO2 97 %  O2 Device Room Air  Patient Activity (if Appropriate) In bed  Assess: MEWS Score  MEWS Temp 0  MEWS Systolic 0  MEWS Pulse 0  MEWS RR 2  MEWS LOC 1  MEWS Score 3  MEWS Score Color Yellow  Assess: if the MEWS score is Yellow or Red  Were vital signs taken at a resting state? Yes  Focused Assessment No change from prior assessment  Early Detection of Sepsis Score *See Row Information* Medium  MEWS guidelines implemented *See Row Information* Yes  Treat  MEWS Interventions Escalated (See documentation below)  Pain Scale PAINAD  Breathing 1  Negative Vocalization 0  Facial Expression 0  Body Language 0  Consolability 0  PAINAD Score 1  Take Vital Signs  Increase Vital Sign Frequency  Yellow: Q 2hr X 2 then Q 4hr X 2, if remains yellow, continue Q 4hrs  Escalate  MEWS: Escalate Yellow: discuss with charge nurse/RN and consider discussing with provider and RRT  Notify: Charge Nurse/RN  Name of Charge Nurse/RN Notified Elmyra Ricks  Date Charge Nurse/RN Notified 06/14/21  Time Charge Nurse/RN Notified 2015  Document  Patient Outcome Not stable and remains on department

## 2021-06-16 ENCOUNTER — Ambulatory Visit: Payer: Self-pay

## 2021-06-16 ENCOUNTER — Telehealth: Payer: Medicare HMO

## 2021-06-16 DIAGNOSIS — F0391 Unspecified dementia with behavioral disturbance: Secondary | ICD-10-CM

## 2021-06-16 DIAGNOSIS — I10 Essential (primary) hypertension: Secondary | ICD-10-CM

## 2021-06-16 DIAGNOSIS — N183 Chronic kidney disease, stage 3 unspecified: Secondary | ICD-10-CM | POA: Diagnosis not present

## 2021-06-16 DIAGNOSIS — E1122 Type 2 diabetes mellitus with diabetic chronic kidney disease: Secondary | ICD-10-CM

## 2021-06-16 DIAGNOSIS — G20 Parkinson's disease: Secondary | ICD-10-CM

## 2021-06-16 DIAGNOSIS — N1831 Chronic kidney disease, stage 3a: Secondary | ICD-10-CM | POA: Diagnosis not present

## 2021-06-16 DIAGNOSIS — N179 Acute kidney failure, unspecified: Secondary | ICD-10-CM | POA: Diagnosis not present

## 2021-06-16 DIAGNOSIS — U071 COVID-19: Secondary | ICD-10-CM | POA: Diagnosis not present

## 2021-06-16 DIAGNOSIS — E1311 Other specified diabetes mellitus with ketoacidosis with coma: Secondary | ICD-10-CM | POA: Diagnosis not present

## 2021-06-16 DIAGNOSIS — G9340 Encephalopathy, unspecified: Secondary | ICD-10-CM | POA: Diagnosis not present

## 2021-06-16 NOTE — Progress Notes (Signed)
SLP Cancellation Note  Patient Details Name: Dylan [name]. MRN: [id number] DOB: [birth year]   Cancelled treatment:       Reason Eval/Treat Not Completed: Medical issues which prohibited therapy (Pt has been transitioned to comfort care. SLP will sign off.)  Xhaiden Coombs I. Hardin Negus, [place], [place] Office number [phone] Pager [phone]  Horton Marshall 06/16/2021, 10:44 AM

## 2021-06-16 NOTE — Progress Notes (Signed)
Currently PROGRESS NOTE    Dylan [name].  DUK:[id number] DOB: 01-09-1941 DOA: 06/16/2021 PCP: [name], MD   Chief Complain:AMS  Brief Narrative: Patient is a 80 year old male with history of CKD stage IIIa, paroxysmal A. fib,, complete AV block status post pacemaker, dementia, Parkinson's disease, peripheral artery disease, hypertension, hyperlipidemia, diabetes who presented with altered mental status from [facility] facility.  He had 3 days of declining mentation and EMS was called.  He is total care at baseline due to significant dementia, does not communicate with anyone except for his wife.  He had very poor oral intake since last few days.  On presentation, lab work showed sodium of 152, potassium of 5.2.  Elevated anion gap of 17, creatinine of 4, severe hyperglycemia.  COVID screen test was positive.  Chest x-ray did not show any pneumonia.  CT did not show any acute intracranial abnormalities.  Patient was admitted for further management.  Started on insulin drip for severe hyperglycemia with elevated anion gap, IV fluids for severe AKI. Hospital course was remarkable for persistent encephalopathy, unresponsiveness, severe hyponatremia.  Given his severe deconditioning, poor quality of life, we discussed goals of care.  Palliative care was consulted.  After multiple meetings, family decided to initiate comfort care, now he is waiting for residential hospice bed.  Difficulty in discharging to hospice because of positive Covid status.   Assessment & Plan:   Principal Problem:   Acute encephalopathy Active Problems:   Peripheral arterial disease (HCC)   Type 2 diabetes mellitus with stage 3a chronic kidney disease, without long-term current use of insulin (HCC)   Acute renal failure superimposed on stage 3a chronic kidney disease ([place])   Parkinsonism [facility])   Pacemaker Dual chamber [employer] MRI  model [id number] 07/11/2020   Parkinson's disease ([place])    Hyperosmolar hyperglycemic state (HHS) ([place])   COVID-19 virus infection   Hypernatremia   Acute encephalopathy: History of Parkinson's disease at baseline.  Not alert and oriented at baseline.  Declining mental status possibly secondary to COVID, AKI, hyperglycemia.  CT head did not show any acute intracranial normalities.Speech therapy recommending NPO.  DKA/hyperglycemia: Presented with blood sugars in the range of 700s, elevated anion gap.  Now on comfort care  Hypernatremia: Most likely secondary dehydration. Was given  hypotonic fluid,,now on comfort  AKI on CKD stage IIIa: Baseline creatinine around 1.4.  Presented with creatinine in the range of 4.    COVID positivity: Fully vaccinated patient with incidental finding, no respiratory illness.  Chest x-ray did not show pneumonia. Given remdesivir  History of paroxysmal A. fib: Takes Eliquis for anticoagulation.  Patient is status post pacemaker placement for complete heart block.    Hyperlipidemia: He was on  pravastatin  History of peripheral artery disease/hypertension: was taking  carvedilol,   History of Parkinson's dementia: Total dependent on care.  Lives in a skilled nursing facility. Was on sinemet, rivastigmine, Seroquel, Namenda  Goals of care: Extremely deconditioned male, dementia at baseline, minimally verbal, with poor quality of life.  Palliative care consult for goals of care. Now on comfort,waiting for residential hospice bed         DVT prophylaxis:SCD Code Status: DNR Family Communication:Discussed with wife face to face on 06/14/21 Status is: Observation  Inpatient  Dispo: The patient is from: SNF              Anticipated d/c is to: residential hospice     Consultants: None  Procedures:None  Antimicrobials:  Anti-infectives (From admission, onward)    Start     Dose/Rate Route Frequency Ordered Stop   06/14/21 1000  remdesivir 100 mg in sodium chloride 0.9 % 100 mL IVPB       See Hyperspace  for full Linked Orders Report.   100 mg 200 mL/hr over 30 Minutes Intravenous Daily 06/13/21 0047 06/15/21 1015   06/13/21 0100  remdesivir 200 mg in sodium chloride 0.9% 250 mL IVPB       See Hyperspace for full Linked Orders Report.   200 mg 580 mL/hr over 30 Minutes Intravenous Once 06/13/21 0047 06/13/21 0346       Subjective:  Patient seen and examined at the bedside this morning.  He was opening his eyes when I saw him today.  He was not  in any kind of discomfort.  Nonverbal.  Tries to obey some commands.  Objective: Vitals:   06/15/21 1415 06/15/21 1500 06/15/21 1600 06/15/21 1900  BP: 130/71 133/67 (!) 119/54 (!) 104/58  Pulse: 87 85  85  Resp: (!) 30 (!) 32 (!) 23 (!) 25  Temp: 98.5 F (36.9 C) 98.9 F (37.2 C) 98.1 F (36.7 C) 98 F (36.7 C)  TempSrc: Axillary  Axillary Oral  SpO2: 100% 100% 100% 100%  Weight:        Intake/Output Summary (Last 24 hours) at 06/16/2021 1125 Last data filed at 06/15/2021 2000 Gross per 24 hour  Intake 0 ml  Output --  Net 0 ml   Filed Weights   06/14/21 1700 06/15/21 0500  Weight: 62 kg 63.2 kg    Examination:   General exam:  lethargic, chronically ill looking Respiratory system:  no wheezes or crackles  Cardiovascular system: S1 & S2 heard, RRR.  Gastrointestinal system: Abdomen is nondistended, soft and nontender. Central nervous system: Not Alert and oriented Extremities: No edema, no clubbing ,no cyanosis Skin:pressure ulcers  Data Reviewed: I have personally reviewed following labs and imaging studies  CBC: Recent Labs  Lab 06/25/2021 2039 06/17/2021 2116 06/15/21 0155  WBC 7.7  --  8.1  NEUTROABS 5.7  --  6.4  HGB 12.8* 12.6* 9.9*  HCT 42.8 37.0* 30.8*  MCV 96.4  --  90.3  PLT 249  --  742   Basic Metabolic Panel: Recent Labs  Lab 06/13/21 0216 06/13/21 0813 06/14/21 0014 06/14/21 0658 06/14/21 1810 06/15/21 0155  NA 153* 157* 155* 154* 154* 154*  K 3.9 3.8 4.1 4.0 4.0 3.8  CL 118* 123* 121*  123* 123* 125*  CO2 19* 22 23 20* 22 19*  GLUCOSE 558* 148* 207* 282* 160* 173*  BUN 136* 134* 136* 135* 131* 131*  CREATININE 3.50* 3.49* 3.20* 3.05* 3.03* 2.97*  CALCIUM 9.3 9.2 9.0 8.7* 8.9 8.6*  MG 2.5*  --   --   --   --   --    GFR: Estimated Creatinine Clearance: 18 mL/min (A) (by C-G formula based on SCr of 2.97 mg/dL (H)). Liver Function Tests: Recent Labs  Lab 06/19/2021 2039  AST 24  ALT 7  ALKPHOS 85  BILITOT 1.1  PROT 6.7  ALBUMIN 2.5*   No results for input(s): LIPASE, AMYLASE in the last 168 hours. Recent Labs  Lab 06/20/2021 2108  AMMONIA 12   Coagulation Profile: Recent Labs  Lab 06/30/2021 2039  INR 2.2*   Cardiac Enzymes: No results for input(s): CKTOTAL, CKMB, CKMBINDEX, TROPONINI in the last 168 hours. BNP (last 3 results) No results for input(s): PROBNP  in the last 8760 hours. HbA1C: No results for input(s): HGBA1C in the last 72 hours. CBG: Recent Labs  Lab 06/14/21 1848 06/14/21 2025 06/15/21 0819 06/15/21 1137 06/15/21 1711  GLUCAP 132* 142* 172* 186* 189*   Lipid Profile: No results for input(s): CHOL, HDL, LDLCALC, TRIG, CHOLHDL, LDLDIRECT in the last 72 hours. Thyroid Function Tests: No results for input(s): TSH, T4TOTAL, FREET4, T3FREE, THYROIDAB in the last 72 hours. Anemia Panel: No results for input(s): VITAMINB12, FOLATE, FERRITIN, TIBC, IRON, RETICCTPCT in the last 72 hours. Sepsis Labs: No results for input(s): PROCALCITON, LATICACIDVEN in the last 168 hours.  Recent Results (from the past 240 hour(s))  Resp Panel by RT-PCR (Flu A&B, Covid) Nasopharyngeal Swab     Status: Abnormal   Collection Time: 06/13/2021  8:39 PM   Specimen: Nasopharyngeal Swab; Nasopharyngeal(NP) swabs in vial transport medium  Result Value Ref Range Status   SARS Coronavirus 2 by RT PCR POSITIVE (A) NEGATIVE Final    Comment: RESULT CALLED TO, READ BACK BY AND VERIFIED WITH: RN G TATE AT 2251 06/24/2021 BY L BENFIELD (NOTE) SARS-CoV-2 target nucleic  acids are DETECTED.  The SARS-CoV-2 RNA is generally detectable in upper respiratory specimens during the acute phase of infection. Positive results are indicative of the presence of the identified virus, but do not rule out bacterial infection or co-infection with other pathogens not detected by the test. Clinical correlation with patient history and other diagnostic information is necessary to determine patient infection status. The expected result is Negative.  Fact Sheet for Patients: EntrepreneurPulse.com.au  Fact Sheet for Healthcare Providers: IncredibleEmployment.be  This test is not yet approved or cleared by the [place] FDA and  has been authorized for detection and/or diagnosis of SARS-CoV-2 by FDA under an Emergency Use Authorization (EUA).  This EUA will remain in effect (meaning this tes t can be used) for the duration of  the COVID-19 declaration under Section 564(b)(1) of the Act, 21 U.S.C. section 360bbb-3(b)(1), unless the authorization is terminated or revoked sooner.     Influenza A by PCR NEGATIVE NEGATIVE Final   Influenza B by PCR NEGATIVE NEGATIVE Final    Comment: (NOTE) The Xpert Xpress SARS-CoV-2/FLU/RSV plus assay is intended as an aid in the diagnosis of influenza from Nasopharyngeal swab specimens and should not be used as a sole basis for treatment. Nasal washings and aspirates are unacceptable for Xpert Xpress SARS-CoV-2/FLU/RSV testing.  Fact Sheet for Patients: EntrepreneurPulse.com.au  Fact Sheet for Healthcare Providers: IncredibleEmployment.be  This test is not yet approved or cleared by the [place] FDA and has been authorized for detection and/or diagnosis of SARS-CoV-2 by FDA under an Emergency Use Authorization (EUA). This EUA will remain in effect (meaning this test can be used) for the duration of the COVID-19 declaration under Section 564(b)(1) of  the Act, 21 U.S.C. section 360bbb-3(b)(1), unless the authorization is terminated or revoked.  Performed at [facility], [place] [address], [place], [place] [address]          Radiology Studies: DG CHEST PORT 1 VIEW  Result Date: 06/14/2021 CLINICAL DATA:  Tachypnea EXAM: PORTABLE CHEST 1 VIEW COMPARISON:  06/17/2021 FINDINGS: Left-sided pacing device as before. Small focal opacity at the left infrahilar lung. No pleural effusion. Normal cardiomediastinal silhouette. Aortic atherosclerosis. No pneumothorax IMPRESSION: Left infrahilar opacity may reflect atelectasis or mild pneumonia. Radiographic follow-up to resolution recommended Electronically Signed   By: Donavan Foil M.D.   On: 06/14/2021 15:22  Scheduled Meds:  Chlorhexidine Gluconate Cloth  6 each Topical Daily   collagenase   Topical BID   sodium chloride flush  3 mL Intravenous Q12H   Continuous Infusions:     LOS: 3 days    Time spent:35 mins. More than 50% of that time was spent in counseling and/or coordination of care.      Shelly Coss, MD [facility] P8/17/2022, 11:25 AM

## 2021-06-16 NOTE — TOC Initial Note (Signed)
Transition of Care (TOC) - Initial/Assessment Note    Patient Details  Name: Dylan [name]. MRN: [id number] Date of Birth: Oct 04, 1941  Transition of Care [facility]) CM/SW Contact:    Benard Halsted, LCSW Phone Number: 06/16/2021, 2:57 PM  Clinical Narrative:                 CSW received consult for residential hospice placement. CSW spoke with [facility] to see if they are still accepting COVID patients but they stated their [place] branch would only be able to accept a patient if they are close to the end of their COVID window. CSW left voicemail for patient's spouse to see if she would like to proceed with reviewing the patient for [place].         Patient Goals and CMS Choice        Expected Discharge Plan and Services                                                Prior Living Arrangements/Services                       Activities of Daily Living      Permission Sought/Granted                  Emotional Assessment              Admission diagnosis:  Tachypnea [R06.82] Hypernatremia [E87.0] LOC (loss of consciousness) ([place]) [R40.20] Acute encephalopathy [G93.40] AKI (acute kidney injury) ([place]) [N17.9] Diabetic ketoacidosis with coma associated with other specified diabetes mellitus ([place]) [E13.11] COVID-19 virus infection [U07.1] Patient Active Problem List   Diagnosis Date Noted   Hyperosmolar hyperglycemic state (HHS) ([place]) 06/13/2021   COVID-19 virus infection 06/13/2021   Hypernatremia 06/13/2021   TIA (transient ischemic attack) 02/10/2021   Acute encephalopathy 02/10/2021   Chronic constipation 12/24/2020   Parkinson's disease ([place]) 12/24/2020   Encounter for care of pacemaker 07/11/2020   Pacemaker Dual chamber [place] MRI  model [id number] 07/11/2020 06/08/2020   Neurogenic orthostatic hypotension ([place]) 06/08/2020   DNR (do not resuscitate) 06/08/2020   Elevated CK 04/27/2020   Gout     Hyperlipemia    Parkinsonism ([place]) 01/29/2020   Lung granuloma ([place]) 07/24/2019   Dementia with behavioral disturbance ([place]) 05/22/2019   Gait abnormality 05/22/2019   Acute renal failure superimposed on stage 3a chronic kidney disease ([place]) 07/14/2018   Nephropathy 07/14/2018   Other disorders of lung 07/14/2018   Atherosclerosis of aorta ([place]) 07/14/2018   Dehydration 07/14/2018   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   CKD (chronic kidney disease), stage III ([place]) 06/13/2018   Syncope 06/13/2018   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[place]) 07/11/2017   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[place]) 06/23/2015   Peripheral arterial disease ([place]) 05/13/2014   Essential hypertension 05/13/2014   Type 2 diabetes mellitus with stage 3a chronic kidney disease, without long-term current use of insulin ([place]) 05/13/2014   PCP:  [name], MD Pharmacy:   [id number] [place], [place] - [address] [place] [address] [place] [address] Phone: [phone] Fax: [phone]  [id number] [place], [place] - [address] AT [facility] [address] [place] [address] Phone:  [phone] Fax: [place] [address] [place] [address] Phone: [phone] Fax: [phone]     Social Determinants of Health (SDOH) Interventions    Readmission Risk Interventions No flowsheet data found.

## 2021-06-16 NOTE — Plan of Care (Signed)
  Problem: Education: Goal: Knowledge of General Education information will improve Description: Including pain rating scale, medication(s)/side effects and non-pharmacologic comfort measures Outcome: Progressing   Problem: Clinical Measurements: Goal: Ability to maintain clinical measurements within normal limits will improve Outcome: Progressing Goal: Will remain free from infection Outcome: Progressing Goal: Diagnostic test results will improve Outcome: Progressing Goal: Respiratory complications will improve Outcome: Progressing Goal: Cardiovascular complication will be avoided Outcome: Progressing   Problem: Activity: Goal: Risk for activity intolerance will decrease Outcome: Progressing   Problem: Nutrition: Goal: Adequate nutrition will be maintained Outcome: Progressing   Problem: Coping: Goal: Level of anxiety will decrease Outcome: Progressing   Problem: Elimination: Goal: Will not experience complications related to bowel motility Outcome: Progressing Goal: Will not experience complications related to urinary retention Outcome: Progressing   Problem: Pain Managment: Goal: General experience of comfort will improve Outcome: Progressing   Problem: Safety: Goal: Ability to remain free from injury will improve Outcome: Progressing   Problem: Skin Integrity: Goal: Risk for impaired skin integrity will decrease Outcome: Progressing   Problem: Education: Goal: Knowledge of risk factors and measures for prevention of condition will improve Outcome: Progressing   Problem: Coping: Goal: Psychosocial and spiritual needs will be supported Outcome: Progressing   Problem: Respiratory: Goal: Will maintain a patent airway Outcome: Progressing Goal: Complications related to the disease process, condition or treatment will be avoided or minimized Outcome: Progressing

## 2021-06-16 NOTE — Progress Notes (Signed)
Daily Progress Note   Patient Name: Dylan [name].       Date: 06/16/2021 DOB: April 21, 1941  Age: 80 y.o. MRN#: [id number] Attending Physician: Shelly Coss, MD Primary Care Physician: [name], MD Admit Date: 06/08/2021  Reason for Consultation/Follow-up: Establishing goals of care  Subjective: Chart Reviewed. Updates Received. Patient Assessed.   Patient remains somnolent. Does open eyes a little more today and for longer periods compared to previous days. Nonverbal. No acute distress noted. No family at the bedside. Appetite remains poor.   I spoke with wife and updates provided. She verbalized appreciation. She has requested information on local funeral homes and crematories which has been provided given she is not familiar with the area. Support provided. We discussed ongoing comfort focused care.   Detailed discussions regarding hospice options given limitations in the setting of COVID. Wife verbalized understanding. Shared with her that [facility] may be an option however wife is not interested expressing wishes to focus on locations only in the [place] and possibly [place] area to allow her the ability to transport herself to spend time with patient. She is also adamant that she does not wish for patient to return to [place] to spend his last days. Wife is aware patient will continue to receive care in the hospital as team is able to secure a safe and compromised discharge plan.  All questions answered.    Length of Stay: 3 days  Vital Signs: BP (!) 104/58 (BP Location: Left Arm)   Pulse 85   Temp 98 F (36.7 C) (Oral)   Resp (!) 25   Wt 63.2 kg   SpO2 100%   BMI 22.49 kg/m  SpO2: SpO2: 100 % O2 Device: O2 Device: Room Air O2 Flow Rate:    Physical Exam: Lethargic, cachectic, chronically-ill appearing. Does open eyes more today RRR Diminished bilaterally No edema, muscle wasting Lethargic, unable to follow commands, nonverbal at baseline            Palliative Care Assessment & Plan  HPI: Per intake H&P --> Patient is a 80 year old male with history of CKD stage IIIa, paroxysmal A. fib,, complete AV block status post pacemaker, dementia, Parkinson's disease, peripheral artery disease, hypertension, hyperlipidemia, diabetes who presented with altered mental status from [facility] facility.  He had 3 days of declining mentation and EMS was called.  He is total care at baseline due to significant dementia, does not communicate with anyone except for his wife.  He had very poor oral intake since last few days.  On presentation, lab work showed sodium of 152, potassium of 5.2.  Elevated anion gap of 17, creatinine of 4, severe hyperglycemia.  COVID screen test was positive.  Chest x-ray did not show any pneumonia.  CT did not show any acute intracranial abnormalities.  Patient was admitted for further management.  Started on insulin drip for severe hyperglycemia with elevated anion gap, IV fluids for severe AKI.  Code Status: DNR  Goals of Care/Recommendations: DNR/DNI, no artificial feeding All care to continue to focus on comfort Wife is not interested in patient returning to [place]. Is requesting [employer] or hospice located near [place] only. Percell Locus, [place] updated) PMT will continue to support and follow as needed.   Prognosis: POOR  Discharge Planning: To Be Determined  Thank you for allowing the Palliative Medicine Team to assist in the care of this patient.  Time Total: 40 min.   Visit consisted of counseling and education dealing with  the complex and emotionally intense issues of symptom management and palliative care in the setting of serious and potentially life-threatening illness.Greater than 50%  of this time was spent counseling and coordinating care related to the above assessment and plan.  Alda Lea, AGPCNP-BC  Palliative Medicine Team [phone]

## 2021-06-17 ENCOUNTER — Ambulatory Visit: Payer: Medicare HMO | Admitting: Cardiology

## 2021-06-17 DIAGNOSIS — G9340 Encephalopathy, unspecified: Secondary | ICD-10-CM | POA: Diagnosis not present

## 2021-06-17 NOTE — Plan of Care (Signed)
  Problem: Education: Goal: Knowledge of General Education information will improve Description: Including pain rating scale, medication(s)/side effects and non-pharmacologic comfort measures Outcome: Progressing   Problem: Clinical Measurements: Goal: Ability to maintain clinical measurements within normal limits will improve Outcome: Progressing Goal: Will remain free from infection Outcome: Progressing Goal: Diagnostic test results will improve Outcome: Progressing Goal: Respiratory complications will improve Outcome: Progressing Goal: Cardiovascular complication will be avoided Outcome: Progressing   Problem: Activity: Goal: Risk for activity intolerance will decrease Outcome: Progressing   Problem: Nutrition: Goal: Adequate nutrition will be maintained Outcome: Progressing   Problem: Coping: Goal: Level of anxiety will decrease Outcome: Progressing   Problem: Elimination: Goal: Will not experience complications related to bowel motility Outcome: Progressing Goal: Will not experience complications related to urinary retention Outcome: Progressing   Problem: Pain Managment: Goal: General experience of comfort will improve Outcome: Progressing   Problem: Safety: Goal: Ability to remain free from injury will improve Outcome: Progressing   Problem: Skin Integrity: Goal: Risk for impaired skin integrity will decrease Outcome: Progressing   Problem: Education: Goal: Knowledge of risk factors and measures for prevention of condition will improve Outcome: Progressing   Problem: Coping: Goal: Psychosocial and spiritual needs will be supported Outcome: Progressing   Problem: Respiratory: Goal: Will maintain a patent airway Outcome: Progressing Goal: Complications related to the disease process, condition or treatment will be avoided or minimized Outcome: Progressing

## 2021-06-17 NOTE — Progress Notes (Signed)
Currently PROGRESS NOTE    Dylan [name].  JOA:[id number] DOB: 17-Jun-1941 DOA: 06/16/2021 PCP: [name], MD   Chief Complain:AMS  Brief Narrative: Patient is a 80 year old male with history of CKD stage IIIa, paroxysmal A. fib,, complete AV block status post pacemaker, dementia, Parkinson's disease, peripheral artery disease, hypertension, hyperlipidemia, diabetes who presented with altered mental status from [facility] facility.  He had 3 days of declining mentation and EMS was called.  He is total care at baseline due to significant dementia, does not communicate with anyone except for his wife.  He had very poor oral intake since last few days.  On presentation, lab work showed sodium of 152, potassium of 5.2.  Elevated anion gap of 17, creatinine of 4, severe hyperglycemia.  COVID screen test was positive.  Chest x-ray did not show any pneumonia.  CT did not show any acute intracranial abnormalities.  Patient was admitted for further management.  Started on insulin drip for severe hyperglycemia with elevated anion gap, IV fluids for severe AKI. Hospital course was remarkable for persistent encephalopathy, unresponsiveness, severe hypernatremia.  Given his severe deconditioning, poor quality of life, we discussed goals of care.  Palliative care was consulted.  After multiple meetings, family decided to initiate comfort care, now he is waiting for residential hospice bed.  Difficulty in discharging to hospice because of positive Covid status.   Assessment & Plan:   Principal Problem:   Acute encephalopathy Active Problems:   Peripheral arterial disease (HCC)   Type 2 diabetes mellitus with stage 3a chronic kidney disease, without long-term current use of insulin (HCC)   Acute renal failure superimposed on stage 3a chronic kidney disease ([place])   Parkinsonism [facility])   Pacemaker Dual chamber [employer] MRI  model [id number] 07/11/2020   Parkinson's disease ([place])    Hyperosmolar hyperglycemic state (HHS) ([place])   COVID-19 virus infection   Hypernatremia   Acute encephalopathy: History of Parkinson's disease at baseline.  Not alert and oriented at baseline.  Declining mental status possibly secondary to COVID, AKI, hyperglycemia.  CT head did not show any acute intracranial normalities.Speech therapy recommending NPO.  DKA/hyperglycemia: Presented with blood sugars in the range of 700s, elevated anion gap.  Now on comfort care  Hypernatremia: Most likely secondary dehydration. Was given  hypotonic fluid,,now on comfort  AKI on CKD stage IIIa: Baseline creatinine around 1.4.  Presented with creatinine in the range of 4.    COVID positivity: Fully vaccinated patient with incidental finding, no respiratory illness.  Chest x-ray did not show pneumonia. Given remdesivir  History of paroxysmal A. fib: Takes Eliquis for anticoagulation.  Patient is status post pacemaker placement for complete heart block.    Hyperlipidemia: He was on  pravastatin  History of peripheral artery disease/hypertension: was taking  carvedilol,   History of Parkinson's dementia: Total dependent on care.  Lives in a skilled nursing facility. Was on sinemet, rivastigmine, Seroquel, Namenda  Goals of care: Extremely deconditioned male, dementia at baseline, minimally verbal, with poor quality of life.  Palliative care consult for goals of care. Now on comfort,waiting for residential hospice bed         DVT prophylaxis:SCD Code Status: DNR Family Communication:Discussed with wife face to face on 06/14/21 Status is: Inpatient  Inpatient  Dispo: The patient is from: SNF              Anticipated d/c is to: residential hospice     Consultants: None  Procedures:None  Antimicrobials:  Anti-infectives (From admission, onward)    Start     Dose/Rate Route Frequency Ordered Stop   06/14/21 1000  remdesivir 100 mg in sodium chloride 0.9 % 100 mL IVPB       See Hyperspace  for full Linked Orders Report.   100 mg 200 mL/hr over 30 Minutes Intravenous Daily 06/13/21 0047 06/15/21 1015   06/13/21 0100  remdesivir 200 mg in sodium chloride 0.9% 250 mL IVPB       See Hyperspace for full Linked Orders Report.   200 mg 580 mL/hr over 30 Minutes Intravenous Once 06/13/21 0047 06/13/21 0346       Subjective:  Patient seen and examined the bedside this morning.  When I call his name, he opened his eyes.  Looks severely weak, lying in bed, does not follow commands but does not appear to be in any kind of distress  Objective: Vitals:   06/15/21 1900 06/16/21 0900 06/16/21 2223 06/17/21 0808  BP: (!) 104/58 132/64 (!) 113/52 128/70  Pulse: 85 81 89 82  Resp: (!) 25 (!) 25 (!) 30 18  Temp: 98 F (36.7 C) 98.2 F (36.8 C) 99.3 F (37.4 C) 99.2 F (37.3 C)  TempSrc: Oral Oral Axillary Skin  SpO2: 100% 99% 94% 100%  Weight:        Intake/Output Summary (Last 24 hours) at 06/17/2021 1332 Last data filed at 06/17/2021 0000 Gross per 24 hour  Intake 0 ml  Output 800 ml  Net -800 ml   Filed Weights   06/14/21 1700 06/15/21 0500  Weight: 62 kg 63.2 kg    Examination:   General exam: Lethargic, chronically ill looking, not in distress HEENT: PERRL Respiratory system:  no wheezes or crackles  Cardiovascular system: S1 & S2 heard, RRR.  Gastrointestinal system: Abdomen is nondistended, soft and nontender. Central nervous system: Not alert and oriented, opens eyes on calling name Extremities: No edema, no clubbing ,no cyanosis Skin: Pressure ulcers on the back  Data Reviewed: I have personally reviewed following labs and imaging studies  CBC: Recent Labs  Lab 06/16/2021 2039 06/15/2021 2116 06/15/21 0155  WBC 7.7  --  8.1  NEUTROABS 5.7  --  6.4  HGB 12.8* 12.6* 9.9*  HCT 42.8 37.0* 30.8*  MCV 96.4  --  90.3  PLT 249  --  950   Basic Metabolic Panel: Recent Labs  Lab 06/13/21 0216 06/13/21 0813 06/14/21 0014 06/14/21 0658 06/14/21 1810  06/15/21 0155  NA 153* 157* 155* 154* 154* 154*  K 3.9 3.8 4.1 4.0 4.0 3.8  CL 118* 123* 121* 123* 123* 125*  CO2 19* 22 23 20* 22 19*  GLUCOSE 558* 148* 207* 282* 160* 173*  BUN 136* 134* 136* 135* 131* 131*  CREATININE 3.50* 3.49* 3.20* 3.05* 3.03* 2.97*  CALCIUM 9.3 9.2 9.0 8.7* 8.9 8.6*  MG 2.5*  --   --   --   --   --    GFR: Estimated Creatinine Clearance: 18 mL/min (A) (by C-G formula based on SCr of 2.97 mg/dL (H)). Liver Function Tests: Recent Labs  Lab 06/17/2021 2039  AST 24  ALT 7  ALKPHOS 85  BILITOT 1.1  PROT 6.7  ALBUMIN 2.5*   No results for input(s): LIPASE, AMYLASE in the last 168 hours. Recent Labs  Lab 06/17/2021 2108  AMMONIA 12   Coagulation Profile: Recent Labs  Lab 06/05/2021 2039  INR 2.2*   Cardiac Enzymes: No results for input(s): CKTOTAL, CKMB, CKMBINDEX,  TROPONINI in the last 168 hours. BNP (last 3 results) No results for input(s): PROBNP in the last 8760 hours. HbA1C: No results for input(s): HGBA1C in the last 72 hours. CBG: Recent Labs  Lab 06/14/21 1848 06/14/21 2025 06/15/21 0819 06/15/21 1137 06/15/21 1711  GLUCAP 132* 142* 172* 186* 189*   Lipid Profile: No results for input(s): CHOL, HDL, LDLCALC, TRIG, CHOLHDL, LDLDIRECT in the last 72 hours. Thyroid Function Tests: No results for input(s): TSH, T4TOTAL, FREET4, T3FREE, THYROIDAB in the last 72 hours. Anemia Panel: No results for input(s): VITAMINB12, FOLATE, FERRITIN, TIBC, IRON, RETICCTPCT in the last 72 hours. Sepsis Labs: No results for input(s): PROCALCITON, LATICACIDVEN in the last 168 hours.  Recent Results (from the past 240 hour(s))  Resp Panel by RT-PCR (Flu A&B, Covid) Nasopharyngeal Swab     Status: Abnormal   Collection Time: 06/10/2021  8:39 PM   Specimen: Nasopharyngeal Swab; Nasopharyngeal(NP) swabs in vial transport medium  Result Value Ref Range Status   SARS Coronavirus 2 by RT PCR POSITIVE (A) NEGATIVE Final    Comment: RESULT CALLED TO, READ BACK BY  AND VERIFIED WITH: RN G TATE AT 2251 05/31/2021 BY L BENFIELD (NOTE) SARS-CoV-2 target nucleic acids are DETECTED.  The SARS-CoV-2 RNA is generally detectable in upper respiratory specimens during the acute phase of infection. Positive results are indicative of the presence of the identified virus, but do not rule out bacterial infection or co-infection with other pathogens not detected by the test. Clinical correlation with patient history and other diagnostic information is necessary to determine patient infection status. The expected result is Negative.  Fact Sheet for Patients: EntrepreneurPulse.com.au  Fact Sheet for Healthcare Providers: IncredibleEmployment.be  This test is not yet approved or cleared by the [place] FDA and  has been authorized for detection and/or diagnosis of SARS-CoV-2 by FDA under an Emergency Use Authorization (EUA).  This EUA will remain in effect (meaning this tes t can be used) for the duration of  the COVID-19 declaration under Section 564(b)(1) of the Act, 21 U.S.C. section 360bbb-3(b)(1), unless the authorization is terminated or revoked sooner.     Influenza A by PCR NEGATIVE NEGATIVE Final   Influenza B by PCR NEGATIVE NEGATIVE Final    Comment: (NOTE) The Xpert Xpress SARS-CoV-2/FLU/RSV plus assay is intended as an aid in the diagnosis of influenza from Nasopharyngeal swab specimens and should not be used as a sole basis for treatment. Nasal washings and aspirates are unacceptable for Xpert Xpress SARS-CoV-2/FLU/RSV testing.  Fact Sheet for Patients: EntrepreneurPulse.com.au  Fact Sheet for Healthcare Providers: IncredibleEmployment.be  This test is not yet approved or cleared by the [place] FDA and has been authorized for detection and/or diagnosis of SARS-CoV-2 by FDA under an Emergency Use Authorization (EUA). This EUA will remain in effect (meaning  this test can be used) for the duration of the COVID-19 declaration under Section 564(b)(1) of the Act, 21 U.S.C. section 360bbb-3(b)(1), unless the authorization is terminated or revoked.  Performed at [facility], [place] [address], [place], [place] [address]          Radiology Studies: No results found.      Scheduled Meds:  Chlorhexidine Gluconate Cloth  6 each Topical Daily   collagenase   Topical BID   sodium chloride flush  3 mL Intravenous Q12H   Continuous Infusions:     LOS: 4 days    Time spent:35 mins. More than 50% of that time was spent in counseling and/or  coordination of care.      Shelly Coss, MD [facility] P8/18/2022, 1:32 PM

## 2021-06-17 NOTE — Progress Notes (Signed)
Nutrition Brief Note  Chart reviewed. Pt now transitioning to comfort care.  No further nutrition interventions planned at this time.  Please re-consult as needed.   Alishah Schulte W, RD, LDN, CDCES Registered Dietitian II Certified Diabetes Care and Education Specialist Please refer to AMION for RD and/or RD on-call/weekend/after hours pager   

## 2021-06-23 NOTE — Chronic Care Management (AMB) (Signed)
Chronic Care Management   CCM RN Visit Note  06/16/2021 Name: Dylan [name]. MRN: [id number] DOB: [birth year]  Subjective: Dylan Broz. is a 80 y.o. year old male who is a primary care patient of [name], MD. The care management team was consulted for assistance with disease management and care coordination needs.    Engaged with patient by telephone for follow up visit in response to provider referral for case management and/or care coordination services.   Consent to Services:  The patient was given information about Chronic Care Management services, agreed to services, and gave verbal consent prior to initiation of services.  Please see initial visit note for detailed documentation.   Patient agreed to services and verbal consent obtained.   Assessment: Review of patient past medical history, allergies, medications, health status, including review of consultants reports, laboratory and other test data, was performed as part of comprehensive evaluation and provision of chronic care management services.   SDOH (Social Determinants of Health) assessments and interventions performed:    CCM Care Plan  Allergies  Allergen Reactions   Quetiapine Other (See Comments)    Agitation     Outpatient Encounter Medications as of 06/16/2021  Medication Sig   apixaban (ELIQUIS) 5 MG TABS tablet Take 1 tablet (5 mg total) by mouth 2 (two) times daily.   Ascorbic Acid (VITAMIN C) 500 MG CAPS Take 1,000 mg by mouth every morning.    carbidopa-levodopa (SINEMET IR) 25-100 MG tablet Take 2 tablets by mouth 3 (three) times daily.   carvedilol (COREG) 12.5 MG tablet TAKE 1 TABLET BY MOUTH 2 TIMES DAILY. (Patient taking differently: Take 12.5 mg by mouth 2 (two) times daily with a meal.)   diclofenac Sodium (VOLTAREN) 1 % GEL Apply 2 g topically 3 (three) times daily. (Patient taking differently: Apply 2 g topically as needed (to painful sites).)   donepezil (ARICEPT) 5 MG tablet Take 5  mg by mouth at bedtime.   glucose blood (ONETOUCH VERIO) test strip Use as instructed to check blood sugars daily dx: e11.22   Lancet Devices (ONE TOUCH DELICA LANCING DEV) MISC Please fill ONE TOUCH DELICA LANCING DEVICE.  Use to test blood sugar twice daily as directed. E11.65 (Patient taking differently: Please fill ONE TOUCH DELICA LANCING DEVICE.  Use to test blood sugar twice daily as directed. E11.65)   Lancets MISC Please fill basic/generic push button lancets.  Patient to test blood sugar twice daily. DX: E11.65   linaclotide (LINZESS) 72 MCG capsule Take 1 capsule (72 mcg total) by mouth daily before breakfast. (Patient not taking: No sig reported)   loperamide (IMODIUM) 2 MG capsule Take 2 mg by mouth as needed for diarrhea or loose stools.   lubiprostone (AMITIZA) 8 MCG capsule Take 8 mcg by mouth 2 (two) times daily.   megestrol (MEGACE) 400 MG/10ML suspension Take 800 mg by mouth daily.   Melatonin 10 MG TABS Take 10-20 mg by mouth at bedtime as needed (for sleep). (Patient not taking: Reported on 06/13/2021)   memantine (NAMENDA) 10 MG tablet TAKE 1 TABLET BY MOUTH TWICE A DAY (Patient taking differently: Take 10 mg by mouth 2 (two) times daily.)   metFORMIN (GLUCOPHAGE) 1000 MG tablet Take 1 tablet by mouth daily (Patient taking differently: Take 1,000 mg by mouth daily with breakfast.)   Polyethyl Glycol-Propyl Glycol (SYSTANE) 0.4-0.3 % SOLN Place 1 drop into both eyes in the morning.   pravastatin (PRAVACHOL) 20 MG tablet TAKE 1 TABLET BY ORAL ROUTE  EVERY DAY (Patient not taking: Reported on 06/13/2021)   QUEtiapine (SEROQUEL) 25 MG tablet TAKE 1 TABLET (25 MG TOTAL) BY MOUTH AT BEDTIME AS NEEDED.   rivastigmine (EXELON) 4.6 mg/24hr Place 1 patch (4.6 mg total) onto the skin daily. (Patient not taking: Reported on 06/13/2021)   rosuvastatin (CRESTOR) 5 MG tablet Take 5 mg by mouth daily.   senna (SENOKOT) 8.6 MG tablet Take 1 tablet by mouth daily.   senna-docusate (SENOKOT-S) 8.6-50  MG tablet Take 1 tablet by mouth every other day. (Patient not taking: Reported on 06/13/2021)   triamcinolone lotion (KENALOG) 0.1 % Apply 1 application topically daily as needed (dry skin).   TRULICITY 1.5 OM/3.5DH SOPN INJECT 1.5 MG INTO THE SKIN ONCE A WEEK. (Patient taking differently: Inject 1.5 mg into the skin every Monday.)   [DISCONTINUED] antiseptic oral rinse (BIOTENE) solution 15 mL    [DISCONTINUED] Chlorhexidine Gluconate Cloth 2 % PADS 6 each    [DISCONTINUED] collagenase (SANTYL) ointment    [DISCONTINUED] fentaNYL (SUBLIMAZE) injection 25 mcg    [DISCONTINUED] glycopyrrolate (ROBINUL) injection 0.2 mg    [DISCONTINUED] haloperidol lactate (HALDOL) injection 0.5 mg    [DISCONTINUED] ondansetron (ZOFRAN) injection 4 mg    [DISCONTINUED] polyvinyl alcohol (LIQUIFILM TEARS) 1.4 % ophthalmic solution 1 drop    [DISCONTINUED] sodium chloride flush (NS) 0.9 % injection 3 mL    No facility-administered encounter medications on file as of 06/16/2021.    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[place]) 06/08/2020   DNR (do not resuscitate) 06/08/2020   Elevated CK 04/27/2020   Gout    Hyperlipemia    Parkinsonism ([place]) 01/29/2020   Lung granuloma ([place]) 07/24/2019   Dementia with behavioral disturbance ([place]) 05/22/2019   Gait abnormality 05/22/2019   Acute renal failure superimposed on stage 3a chronic kidney disease ([place]) 07/14/2018   Nephropathy 07/14/2018   Other disorders of lung 07/14/2018   Atherosclerosis of aorta ([place])  07/14/2018   Dehydration 07/14/2018   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   CKD (chronic kidney disease), stage III ([place]) 06/13/2018   Syncope 06/13/2018   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[place]) 07/11/2017   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[place]) 06/23/2015   Peripheral arterial disease ([place]) 05/13/2014   Essential hypertension 05/13/2014   Type 2 diabetes mellitus with stage 3a chronic kidney disease, without long-term current use of insulin ([place]) 05/13/2014    Conditions to be addressed/monitored: Type II DM, non-insulin dependent, CKD, stage III, Essential hypertension, Dementia, Parkinsonism, unspecified  Care Plan : Dementia (Adult)  Updates made by Lynne Logan, RN since 06/16/2021 12:00 AM     Problem: Behavioral Symptoms   Priority: High     Long-Range Goal: Behavior Symptoms Management   Start Date: 10/15/2020  Expected End Date: 10/15/2021  Recent Progress: On track  Priority: High  Note:   Current Barriers:  Ineffective Self Health Maintenance Unable to self administer medications as prescribed Lacks social connections Unable to perform ADLs independently Unable to perform IADLs independently Currently UNABLE TO independently self manage needs related to chronic health conditions.  Knowledge Deficits related  to short term plan for care coordination needs and long term plans for chronic disease management needs Nurse Case Manager Clinical Goal(s):  Patient will work with care management team to address care coordination and chronic disease management needs related to Disease Management Educational Needs Care Coordination Medication Management and Education Psychosocial Support Dementia and Caregiver Support   Interventions:  06/16/21 completed inbound call with spouse Dylan [name]  Collaboration with [name], MD regarding development and update of comprehensive plan of care as evidenced by provider attestation and co-signature Inter-disciplinary  care team collaboration (see longitudinal plan of care) Determined [name] is calling today to report Mr. Sternberg was inpatient after having his Oxygen level drop and his blood sugar was noted to be too high for the glucose meter to read  Determined he was transferred to [facility] on 06/16/2021 for dx: Acute Encephalopathy and was found to be COVID positive Discussed spouse was advised patient is at end of life and Hospice care is recommended in which [name] has agreed  Discussed Mr. Pattillo will be transferred to a Hospice facility once a bed is available  Provided [name] active listening and validated her feelings of concern and grief over the rapid decline of her spouse Collaborated with [name] to make her aware of patient's status and plans to transfer to Hospice  Discussed with spouse [name] for ongoing care management follow up and provided patient with direct contact information for care management team Patient Self Care Activities:  - contact the CCM team for collaboration once Mr. Jodoin is discharged home from inpatient rehab Patient/Caregiver Goal:  - continue to provide care for Mr. Willet    Follow Up Plan: Telephone follow up appointment with care management team member for 7-10 days to confirm Hospice services are in place      Plan:Telephone follow up appointment with care management team member scheduled for:  7-10 days  [name], RN, BSN, CCM Care Management Coordinator [place] [facility]  Direct Phone: [phone]

## 2021-06-23 NOTE — Patient Instructions (Signed)
Goals Addressed      Behavior Symptoms Managed   On track    Timeframe:  Long-Range Goal Priority:  High Start Date: 10/15/20                            Expected End Date: 10/15/21  Next scheduled follow up: 7-10 days               Follow up with spouse to confirm patient has been discharged for inpatient Hospice Care

## 2021-06-24 ENCOUNTER — Ambulatory Visit: Payer: Medicare HMO | Admitting: Neurology

## 2021-06-25 ENCOUNTER — Telehealth: Payer: Medicare HMO

## 2021-07-01 NOTE — Progress Notes (Signed)
Pt time of death 46. Confirmed with Education administrator. Pts wife made aware and came to bedside. Took pts belongings.

## 2021-07-01 DEATH — deceased

## 2021-07-26 ENCOUNTER — Ambulatory Visit: Payer: Medicare HMO | Admitting: Podiatry

## 2021-07-31 NOTE — Death Summary Note (Signed)
Death Summary  Dylan [name]. [id number] DOB: [birth year] DOA: 06/21/2021  PCP: [name], MD  Admit date: 06/21/21 Date of Death: Jun 27, 2021 Time of [id number]    History of present illness:   Patient is a 80 year old male with history of CKD stage IIIa, paroxysmal A. fib,, complete AV block status post pacemaker, dementia, Parkinson's disease, peripheral artery disease, hypertension, hyperlipidemia, diabetes who presented with altered mental status from [facility] facility.  He had 3 days of declining mentation and EMS was called.  He is total care at baseline due to significant dementia, does not communicate with anyone except for his wife.  He had very poor oral intake since last few days.  On presentation, lab work showed sodium of 152, potassium of 5.2.  Elevated anion gap of 17, creatinine of 4, severe hyperglycemia.  COVID screen test was positive.  Chest x-ray did not show any pneumonia.  CT did not show any acute intracranial abnormalities.  Patient was admitted for further management.  Started on insulin drip for severe hyperglycemia with elevated anion gap, IV fluids for severe AKI. Hospital course was remarkable for persistent encephalopathy, unresponsiveness, severe hypernatremia.  Given his severe deconditioning, poor quality of life, we discussed goals of care.  Palliative care was consulted.  After multiple meetings, family decided to initiate comfort care, he was waiting for residential hospice bed.  He passed away in hospital   Final Diagnoses:  1.  Severe dehydration due to COVID infection   The results of significant diagnostics from this hospitalization (including imaging, microbiology, ancillary and laboratory) are listed below for reference.    Significant Diagnostic Studies: DG Chest 1 View  Result Date: 06/21/21 CLINICAL DATA:  Altered mental status EXAM: CHEST  1 VIEW COMPARISON:  02/14/2021 FINDINGS: Lungs are clear.  No pleural effusion  or pneumothorax. The heart is normal in size.  Left subclavian pacemaker. IMPRESSION: No evidence of acute cardiopulmonary disease. Electronically Signed   By: Julian Hy M.D.   On: June 21, 2021 21:42   DG Knee 2 Views Right  Result Date: 21-Jun-2021 CLINICAL DATA:  Pain EXAM: RIGHT KNEE - 1-2 VIEW COMPARISON:  None. FINDINGS: No fracture or dislocation is seen. The joint spaces are preserved. The visualized soft tissues are unremarkable. No suprapatellar knee joint effusion. IMPRESSION: Negative. Electronically Signed   By: Julian Hy M.D.   On: 06/21/21 21:42   CT HEAD WO CONTRAST  Result Date: 2021-06-21 CLINICAL DATA:  Altered mental status EXAM: CT HEAD WITHOUT CONTRAST TECHNIQUE: Contiguous axial images were obtained from the base of the skull through the vertex without intravenous contrast. COMPARISON:  02/27/2021 FINDINGS: Brain: No evidence of acute infarction, hemorrhage, hydrocephalus, extra-axial collection or mass lesion/mass effect. Global cortical and central atrophy. Subcortical white matter and periventricular small vessel ischemic changes. Vascular: Intracranial atherosclerosis. Skull: Normal. Negative for fracture or focal lesion. Sinuses/Orbits: The visualized paranasal sinuses are essentially clear. The mastoid air cells are unopacified. Other: None. IMPRESSION: No evidence of acute intracranial abnormality. Atrophy with small vessel ischemic changes. Electronically Signed   By: Julian Hy M.D.   On: 21-Jun-2021 21:27   US Renal  Result Date: 06/13/2021 CLINICAL DATA:  Acute renal failure EXAM: RENAL / URINARY TRACT ULTRASOUND COMPLETE COMPARISON:  CT abdomen/pelvis dated 02/14/2021 FINDINGS: Right Kidney: Renal measurements: 10.6 x 6.1 x 4.7 cm = volume: 159 mL. Echogenicity within normal limits. No mass or hydronephrosis visualized. Left Kidney: Not discretely visualized. Bladder: Appears normal for degree of bladder distention. Other: None. IMPRESSION:  Left  kidney is not discretely visualized. Right kidney is unremarkable.  No hydronephrosis. Electronically Signed   By: Julian Hy M.D.   On: 06/13/2021 00:45   DG CHEST PORT 1 VIEW  Result Date: 06/14/2021 CLINICAL DATA:  Tachypnea EXAM: PORTABLE CHEST 1 VIEW COMPARISON:  06/07/2021 FINDINGS: Left-sided pacing device as before. Small focal opacity at the left infrahilar lung. No pleural effusion. Normal cardiomediastinal silhouette. Aortic atherosclerosis. No pneumothorax IMPRESSION: Left infrahilar opacity may reflect atelectasis or mild pneumonia. Radiographic follow-up to resolution recommended Electronically Signed   By: Donavan Foil M.D.   On: 06/14/2021 15:22    Microbiology: No results found for this or any previous visit (from the past 240 hour(s)).   Labs: Basic Metabolic Panel: No results for input(s): NA, K, CL, CO2, GLUCOSE, BUN, CREATININE, CALCIUM, MG, PHOS in the last 168 hours. Liver Function Tests: No results for input(s): AST, ALT, ALKPHOS, BILITOT, PROT, ALBUMIN in the last 168 hours. No results for input(s): LIPASE, AMYLASE in the last 168 hours. No results for input(s): AMMONIA in the last 168 hours. CBC: No results for input(s): WBC, NEUTROABS, HGB, HCT, MCV, PLT in the last 168 hours. Cardiac Enzymes: No results for input(s): CKTOTAL, CKMB, CKMBINDEX, TROPONINI in the last 168 hours. D-Dimer No results for input(s): DDIMER in the last 72 hours. BNP: Invalid input(s): POCBNP CBG: No results for input(s): GLUCAP in the last 168 hours. Anemia work up No results for input(s): VITAMINB12, FOLATE, FERRITIN, TIBC, IRON, RETICCTPCT in the last 72 hours. Urinalysis    Component Value Date/Time   COLORURINE YELLOW 06/14/2021 0400   APPEARANCEUR HAZY (A) 06/14/2021 0400   LABSPEC 1.017 06/14/2021 0400   PHURINE 5.0 06/14/2021 0400   GLUCOSEU 50 (A) 06/14/2021 0400   HGBUR NEGATIVE 06/14/2021 0400   BILIRUBINUR NEGATIVE 06/14/2021 0400   BILIRUBINUR negative  11/03/2020 1747   KETONESUR NEGATIVE 06/14/2021 0400   PROTEINUR NEGATIVE 06/14/2021 0400   UROBILINOGEN 0.2 11/03/2020 1747   NITRITE NEGATIVE 06/14/2021 0400   LEUKOCYTESUR NEGATIVE 06/14/2021 0400   Sepsis Labs Invalid input(s): PROCALCITONIN,  WBC,  LACTICIDVEN     SIGNED:  Shelly Coss, MD  [facility] 07/10/2021, 4:43 PM Pager [phone]  If 7PM-7AM, please contact night-coverage [email or web address] Password [email or web address]

## 2021-08-18 NOTE — Telephone Encounter (Signed)
error 

## 2021-10-27 ENCOUNTER — Ambulatory Visit: Payer: Medicare HMO

## 2021-11-11 ENCOUNTER — Encounter: Payer: Medicare HMO | Admitting: Internal Medicine

## 2021-12-21 IMAGING — DX DG KNEE 1-2V*R*
2 series · 2 of 2 positions shown · non-contrast
Comparison: None.

CLINICAL DATA: Pain

EXAM:
RIGHT KNEE - 1-2 VIEW

[knee ap]
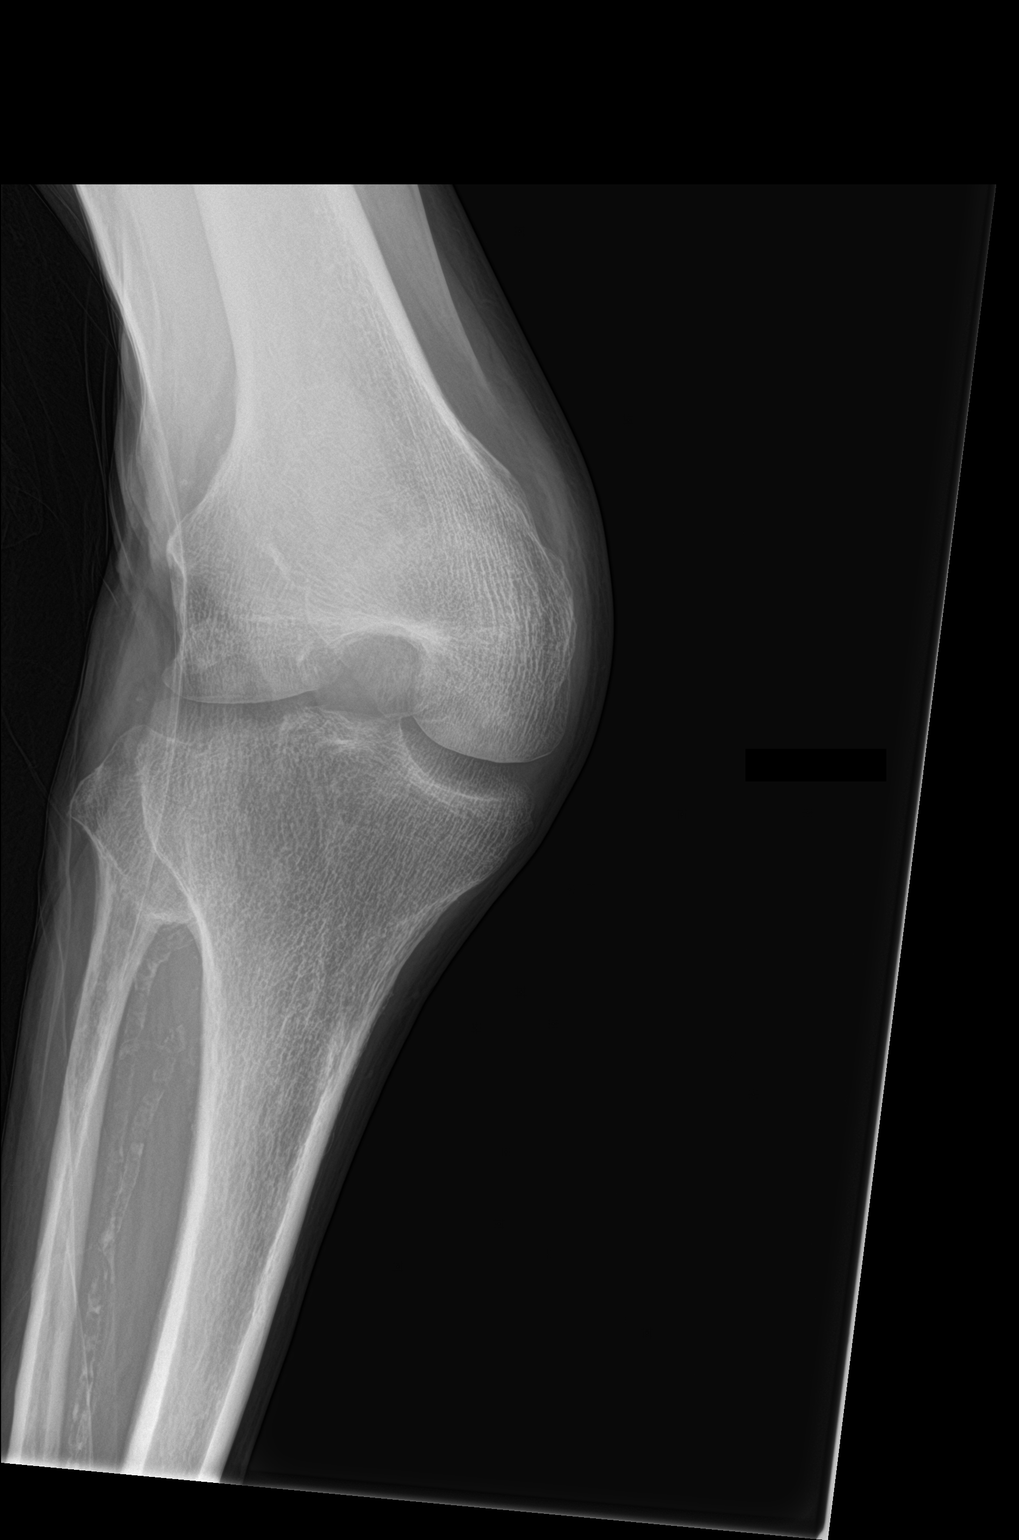

[knee lat]
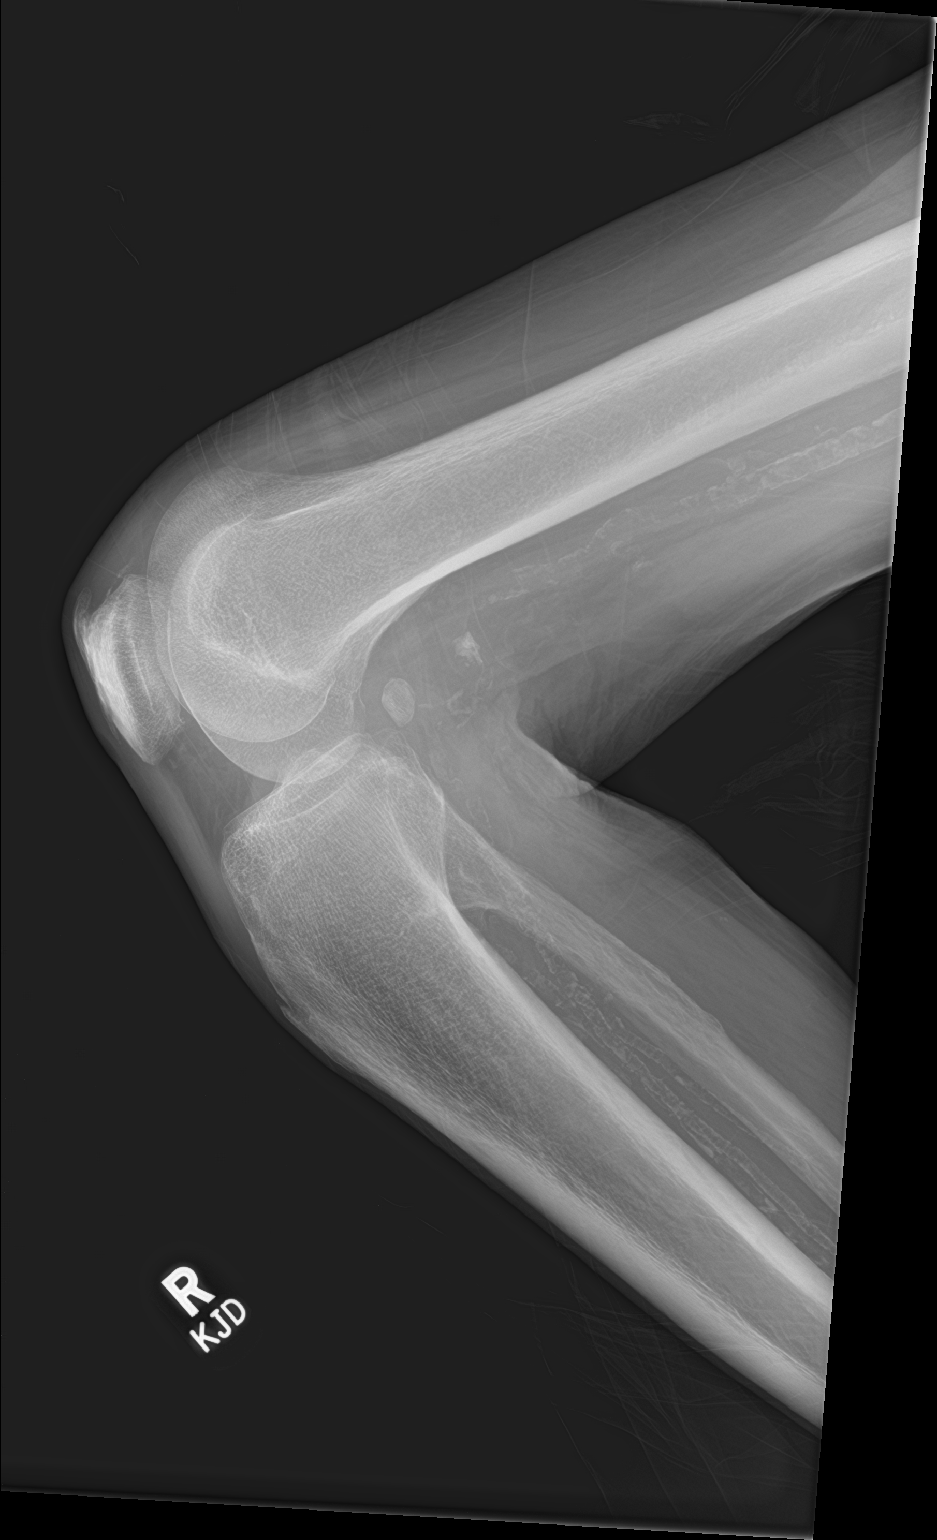

[2 of 2 positions shown; findings below may reference images not displayed]

FINDINGS: No fracture or dislocation is seen.

The joint spaces are preserved.

The visualized soft tissues are unremarkable.

No suprapatellar knee joint effusion.
IMPRESSION: Negative.

## 2021-12-21 IMAGING — DX DG CHEST 1V
1 series · 1 of 1 positions shown · non-contrast
Comparison: 02/14/2021

CLINICAL DATA: Altered mental status

EXAM:
CHEST  1 VIEW

[chest ap]
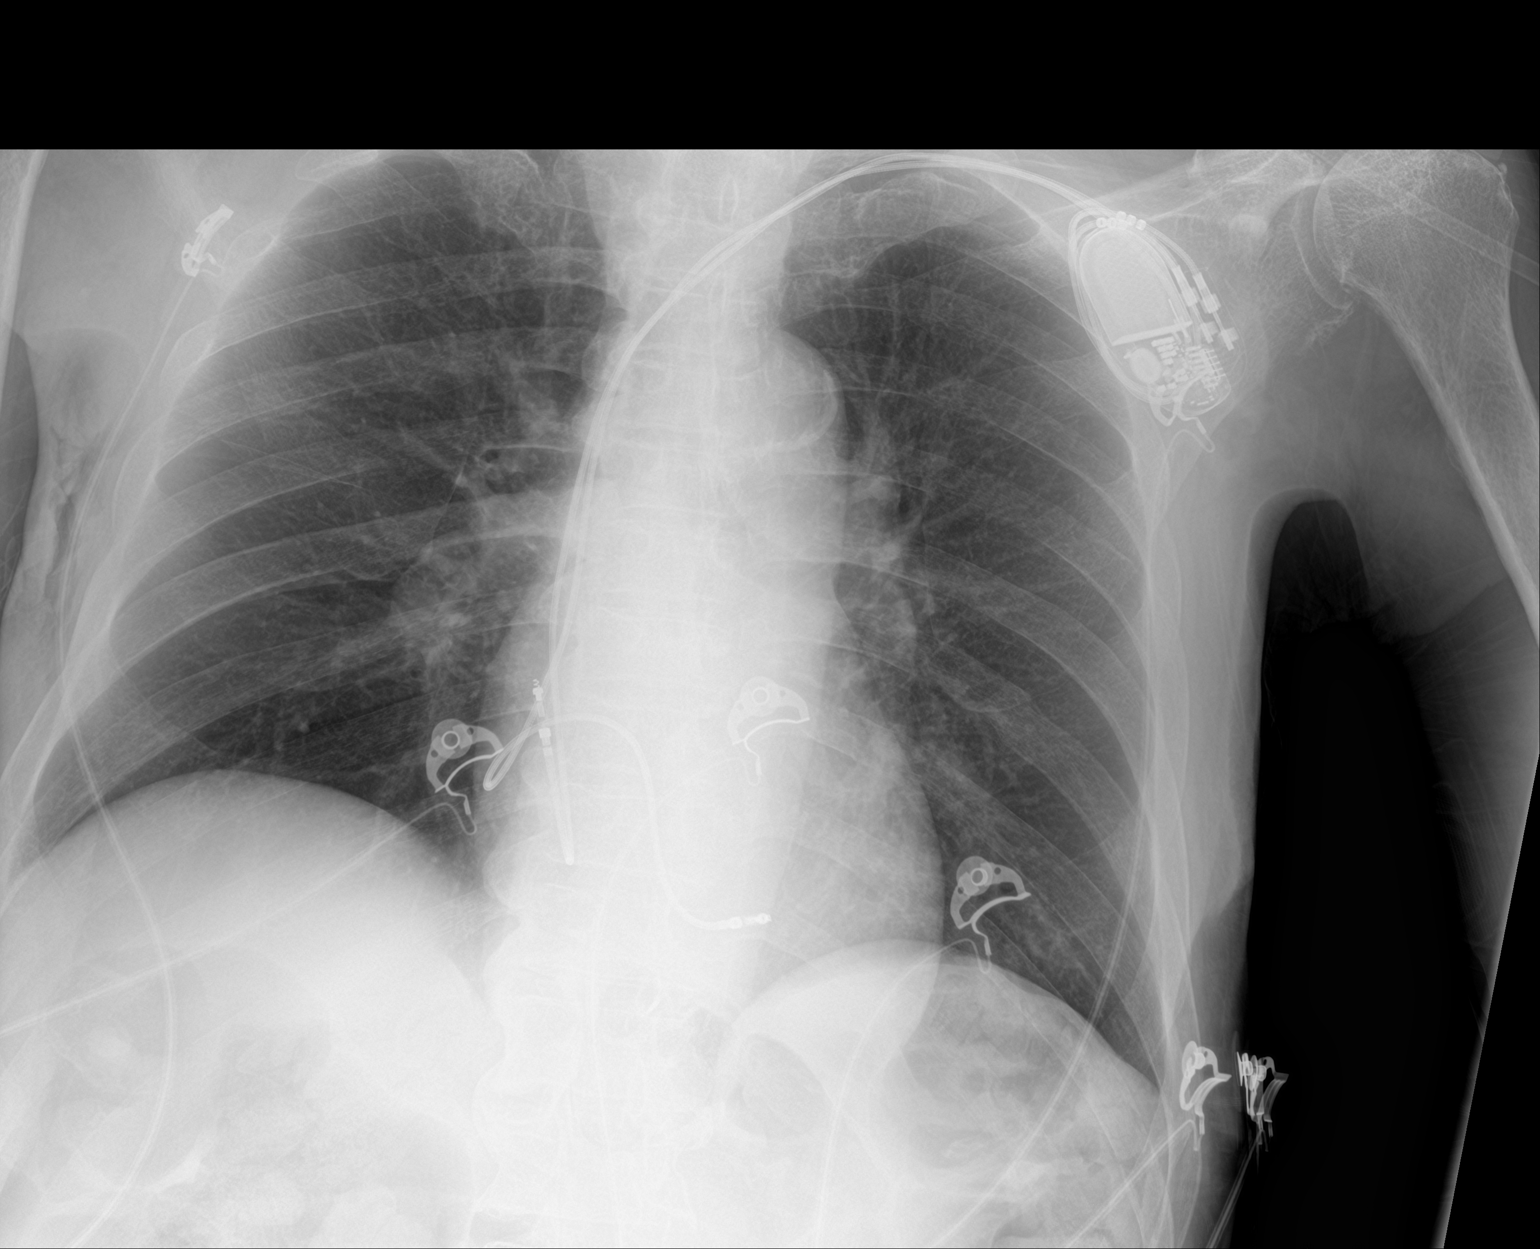

[1 of 1 positions shown; findings below may reference images not displayed]

FINDINGS: Lungs are clear.  No pleural effusion or pneumothorax.

The heart is normal in size.  Left subclavian pacemaker.
IMPRESSION: No evidence of acute cardiopulmonary disease.

## 2022-01-20 ENCOUNTER — Ambulatory Visit: Payer: Medicare HMO | Admitting: Cardiology
